# Patient Record
Sex: Male | Born: 1935 | Race: Black or African American | Hispanic: No | Smoking: Former smoker
Health system: Southern US, Community
[De-identification: ages and names within clinical notes are randomized; demographics above are authoritative.]

## PROBLEM LIST (undated history)

## (undated) DIAGNOSIS — F0280 Dementia in other diseases classified elsewhere without behavioral disturbance: Secondary | ICD-10-CM

## (undated) DIAGNOSIS — E785 Hyperlipidemia, unspecified: Secondary | ICD-10-CM

## (undated) DIAGNOSIS — E119 Type 2 diabetes mellitus without complications: Secondary | ICD-10-CM

## (undated) DIAGNOSIS — N184 Chronic kidney disease, stage 4 (severe): Secondary | ICD-10-CM

## (undated) DIAGNOSIS — I10 Essential (primary) hypertension: Secondary | ICD-10-CM

## (undated) DIAGNOSIS — E1122 Type 2 diabetes mellitus with diabetic chronic kidney disease: Secondary | ICD-10-CM

## (undated) DIAGNOSIS — E1165 Type 2 diabetes mellitus with hyperglycemia: Secondary | ICD-10-CM

## (undated) DIAGNOSIS — G309 Alzheimer's disease, unspecified: Secondary | ICD-10-CM

## (undated) DIAGNOSIS — E1129 Type 2 diabetes mellitus with other diabetic kidney complication: Secondary | ICD-10-CM

## (undated) DIAGNOSIS — IMO0001 Reserved for inherently not codable concepts without codable children: Secondary | ICD-10-CM

## (undated) DIAGNOSIS — I509 Heart failure, unspecified: Secondary | ICD-10-CM

## (undated) DIAGNOSIS — R001 Bradycardia, unspecified: Secondary | ICD-10-CM

## (undated) DIAGNOSIS — E1169 Type 2 diabetes mellitus with other specified complication: Secondary | ICD-10-CM

## (undated) DIAGNOSIS — Z794 Long term (current) use of insulin: Secondary | ICD-10-CM

## (undated) HISTORY — DX: Type 2 diabetes mellitus with hyperglycemia: E11.65

## (undated) HISTORY — DX: Type 2 diabetes mellitus with other diabetic kidney complication: E11.29

## (undated) HISTORY — DX: Hyperlipidemia, unspecified: E78.5

## (undated) HISTORY — DX: Reserved for inherently not codable concepts without codable children: IMO0001

## (undated) HISTORY — DX: Essential (primary) hypertension: I10

## (undated) HISTORY — DX: Type 2 diabetes mellitus with diabetic chronic kidney disease: E11.22

## (undated) HISTORY — DX: Heart failure, unspecified: I50.9

## (undated) HISTORY — DX: Long term (current) use of insulin: Z79.4

## (undated) HISTORY — DX: Type 2 diabetes mellitus without complications: E11.9

## (undated) HISTORY — DX: Dementia in other diseases classified elsewhere without behavioral disturbance: F02.80

## (undated) HISTORY — DX: Type 2 diabetes mellitus with other specified complication: E11.69

## (undated) HISTORY — DX: Alzheimer's disease, unspecified: G30.9

## (undated) HISTORY — DX: Chronic kidney disease, stage 4 (severe): N18.4

## (undated) HISTORY — PX: CARPAL TUNNEL RELEASE: SHX101

---

## 1989-02-18 HISTORY — PX: CERVICAL SPINE SURGERY: SHX589

## 1997-10-08 ENCOUNTER — Other Ambulatory Visit: Admission: RE | Admit: 1997-10-08 | Discharge: 1997-10-08 | Payer: Self-pay | Admitting: Internal Medicine

## 1999-08-05 ENCOUNTER — Encounter: Payer: Self-pay | Admitting: Internal Medicine

## 1999-08-05 ENCOUNTER — Ambulatory Visit (HOSPITAL_COMMUNITY): Admission: RE | Admit: 1999-08-05 | Discharge: 1999-08-05 | Payer: Self-pay | Admitting: Internal Medicine

## 1999-09-24 ENCOUNTER — Encounter: Admission: RE | Admit: 1999-09-24 | Discharge: 1999-09-24 | Payer: Self-pay | Admitting: Cardiology

## 1999-09-24 ENCOUNTER — Encounter: Payer: Self-pay | Admitting: Cardiology

## 1999-09-28 ENCOUNTER — Ambulatory Visit (HOSPITAL_COMMUNITY): Admission: RE | Admit: 1999-09-28 | Discharge: 1999-09-28 | Payer: Self-pay | Admitting: Cardiology

## 2000-08-09 ENCOUNTER — Ambulatory Visit (HOSPITAL_COMMUNITY): Admission: RE | Admit: 2000-08-09 | Discharge: 2000-08-09 | Payer: Self-pay | Admitting: Internal Medicine

## 2000-08-09 ENCOUNTER — Encounter: Payer: Self-pay | Admitting: Internal Medicine

## 2000-11-10 ENCOUNTER — Encounter: Payer: Self-pay | Admitting: Internal Medicine

## 2000-11-10 ENCOUNTER — Ambulatory Visit (HOSPITAL_COMMUNITY): Admission: RE | Admit: 2000-11-10 | Discharge: 2000-11-10 | Payer: Self-pay | Admitting: Internal Medicine

## 2001-06-20 HISTORY — PX: LUMBAR SPINE SURGERY: SHX701

## 2001-07-24 ENCOUNTER — Encounter: Payer: Self-pay | Admitting: Neurosurgery

## 2001-07-24 ENCOUNTER — Ambulatory Visit (HOSPITAL_COMMUNITY): Admission: RE | Admit: 2001-07-24 | Discharge: 2001-07-24 | Payer: Self-pay | Admitting: Neurosurgery

## 2001-08-01 ENCOUNTER — Ambulatory Visit (HOSPITAL_COMMUNITY): Admission: RE | Admit: 2001-08-01 | Discharge: 2001-08-01 | Payer: Self-pay | Admitting: Neurosurgery

## 2001-08-01 ENCOUNTER — Encounter: Payer: Self-pay | Admitting: Neurosurgery

## 2001-10-08 ENCOUNTER — Encounter: Payer: Self-pay | Admitting: Neurosurgery

## 2001-10-08 ENCOUNTER — Inpatient Hospital Stay (HOSPITAL_COMMUNITY): Admission: RE | Admit: 2001-10-08 | Discharge: 2001-10-12 | Payer: Self-pay | Admitting: Neurosurgery

## 2002-10-07 ENCOUNTER — Encounter: Payer: Self-pay | Admitting: Internal Medicine

## 2002-10-07 ENCOUNTER — Ambulatory Visit (HOSPITAL_COMMUNITY): Admission: RE | Admit: 2002-10-07 | Discharge: 2002-10-07 | Payer: Self-pay | Admitting: Internal Medicine

## 2003-10-02 ENCOUNTER — Encounter: Admission: RE | Admit: 2003-10-02 | Discharge: 2003-12-31 | Payer: Self-pay | Admitting: Endocrinology

## 2004-04-20 HISTORY — PX: CARDIAC CATHETERIZATION: SHX172

## 2004-05-24 ENCOUNTER — Ambulatory Visit: Payer: Self-pay | Admitting: Endocrinology

## 2004-06-04 ENCOUNTER — Ambulatory Visit: Payer: Self-pay | Admitting: Endocrinology

## 2004-07-09 ENCOUNTER — Ambulatory Visit: Payer: Self-pay | Admitting: Endocrinology

## 2004-09-28 ENCOUNTER — Ambulatory Visit: Payer: Self-pay | Admitting: Endocrinology

## 2004-10-08 ENCOUNTER — Ambulatory Visit: Payer: Self-pay | Admitting: Endocrinology

## 2004-12-28 ENCOUNTER — Ambulatory Visit (HOSPITAL_COMMUNITY): Admission: RE | Admit: 2004-12-28 | Discharge: 2004-12-28 | Payer: Self-pay | Admitting: Ophthalmology

## 2005-01-10 ENCOUNTER — Ambulatory Visit: Payer: Self-pay | Admitting: Endocrinology

## 2005-01-28 ENCOUNTER — Ambulatory Visit: Payer: Self-pay | Admitting: Internal Medicine

## 2005-02-03 ENCOUNTER — Ambulatory Visit (HOSPITAL_COMMUNITY): Admission: RE | Admit: 2005-02-03 | Discharge: 2005-02-03 | Payer: Self-pay | Admitting: Gastroenterology

## 2005-02-08 ENCOUNTER — Encounter: Admission: RE | Admit: 2005-02-08 | Discharge: 2005-05-09 | Payer: Self-pay | Admitting: Endocrinology

## 2005-03-02 ENCOUNTER — Ambulatory Visit: Payer: Self-pay | Admitting: Endocrinology

## 2005-04-12 ENCOUNTER — Ambulatory Visit: Payer: Self-pay | Admitting: Endocrinology

## 2005-04-20 ENCOUNTER — Ambulatory Visit: Payer: Self-pay | Admitting: Endocrinology

## 2005-05-24 ENCOUNTER — Ambulatory Visit: Payer: Self-pay | Admitting: Endocrinology

## 2005-06-24 ENCOUNTER — Ambulatory Visit: Payer: Self-pay | Admitting: Endocrinology

## 2005-09-05 ENCOUNTER — Ambulatory Visit (HOSPITAL_COMMUNITY): Admission: RE | Admit: 2005-09-05 | Discharge: 2005-09-05 | Payer: Self-pay | Admitting: Internal Medicine

## 2005-09-08 ENCOUNTER — Ambulatory Visit: Payer: Self-pay | Admitting: Endocrinology

## 2005-09-22 ENCOUNTER — Ambulatory Visit: Payer: Self-pay | Admitting: Endocrinology

## 2005-10-25 ENCOUNTER — Ambulatory Visit: Payer: Self-pay | Admitting: Endocrinology

## 2005-12-06 ENCOUNTER — Ambulatory Visit: Payer: Self-pay | Admitting: Endocrinology

## 2006-03-08 ENCOUNTER — Ambulatory Visit: Payer: Self-pay | Admitting: Endocrinology

## 2006-04-07 ENCOUNTER — Ambulatory Visit: Payer: Self-pay | Admitting: Endocrinology

## 2006-05-20 HISTORY — PX: TRANSTHORACIC ECHOCARDIOGRAM: SHX275

## 2006-06-04 ENCOUNTER — Inpatient Hospital Stay (HOSPITAL_COMMUNITY): Admission: EM | Admit: 2006-06-04 | Discharge: 2006-06-19 | Payer: Self-pay | Admitting: Emergency Medicine

## 2006-06-05 ENCOUNTER — Encounter (INDEPENDENT_AMBULATORY_CARE_PROVIDER_SITE_OTHER): Payer: Self-pay | Admitting: Cardiology

## 2006-07-07 ENCOUNTER — Ambulatory Visit: Payer: Self-pay | Admitting: Endocrinology

## 2006-10-03 ENCOUNTER — Ambulatory Visit: Payer: Self-pay | Admitting: Endocrinology

## 2006-11-20 ENCOUNTER — Ambulatory Visit: Payer: Self-pay | Admitting: Endocrinology

## 2006-12-06 ENCOUNTER — Ambulatory Visit: Payer: Self-pay | Admitting: Endocrinology

## 2007-01-03 ENCOUNTER — Ambulatory Visit: Payer: Self-pay | Admitting: Endocrinology

## 2007-01-03 LAB — CONVERTED CEMR LAB: Hgb A1c MFr Bld: 8.4 % — ABNORMAL HIGH (ref 4.6–6.0)

## 2007-01-10 ENCOUNTER — Encounter: Payer: Self-pay | Admitting: Endocrinology

## 2007-02-02 ENCOUNTER — Ambulatory Visit: Payer: Self-pay | Admitting: Endocrinology

## 2007-05-03 ENCOUNTER — Ambulatory Visit: Payer: Self-pay | Admitting: Endocrinology

## 2007-08-30 ENCOUNTER — Ambulatory Visit (HOSPITAL_COMMUNITY): Admission: RE | Admit: 2007-08-30 | Discharge: 2007-08-30 | Payer: Self-pay | Admitting: Otolaryngology

## 2007-08-31 ENCOUNTER — Ambulatory Visit: Payer: Self-pay | Admitting: Endocrinology

## 2007-10-05 ENCOUNTER — Telehealth: Payer: Self-pay | Admitting: Endocrinology

## 2007-10-12 ENCOUNTER — Ambulatory Visit: Payer: Self-pay | Admitting: Endocrinology

## 2007-11-07 ENCOUNTER — Encounter: Payer: Self-pay | Admitting: Endocrinology

## 2007-11-27 ENCOUNTER — Ambulatory Visit: Payer: Self-pay | Admitting: Endocrinology

## 2007-12-03 ENCOUNTER — Telehealth: Payer: Self-pay | Admitting: Endocrinology

## 2008-01-31 ENCOUNTER — Telehealth: Payer: Self-pay | Admitting: Endocrinology

## 2008-02-28 ENCOUNTER — Ambulatory Visit: Payer: Self-pay | Admitting: Endocrinology

## 2008-05-29 ENCOUNTER — Ambulatory Visit: Payer: Self-pay | Admitting: Endocrinology

## 2008-06-04 ENCOUNTER — Telehealth (INDEPENDENT_AMBULATORY_CARE_PROVIDER_SITE_OTHER): Payer: Self-pay | Admitting: *Deleted

## 2008-06-20 LAB — HM COLONOSCOPY

## 2008-08-28 ENCOUNTER — Ambulatory Visit: Payer: Self-pay | Admitting: Endocrinology

## 2008-11-27 ENCOUNTER — Ambulatory Visit: Payer: Self-pay | Admitting: Endocrinology

## 2008-12-03 ENCOUNTER — Encounter: Payer: Self-pay | Admitting: Endocrinology

## 2008-12-13 ENCOUNTER — Encounter: Payer: Self-pay | Admitting: Endocrinology

## 2009-02-26 ENCOUNTER — Ambulatory Visit: Payer: Self-pay | Admitting: Endocrinology

## 2009-02-26 LAB — CONVERTED CEMR LAB
Creatinine,U: 156.9 mg/dL
Microalb Creat Ratio: 1.3 mg/g (ref 0.0–30.0)
Microalb, Ur: 0.2 mg/dL (ref 0.0–1.9)

## 2009-05-28 ENCOUNTER — Ambulatory Visit: Payer: Self-pay | Admitting: Endocrinology

## 2009-05-28 LAB — CONVERTED CEMR LAB: Hgb A1c MFr Bld: 8.8 % — ABNORMAL HIGH (ref 4.6–6.5)

## 2009-06-04 ENCOUNTER — Encounter: Payer: Self-pay | Admitting: Endocrinology

## 2009-08-20 ENCOUNTER — Encounter: Admission: RE | Admit: 2009-08-20 | Discharge: 2009-08-20 | Payer: Self-pay | Admitting: Gastroenterology

## 2009-08-27 ENCOUNTER — Ambulatory Visit: Payer: Self-pay | Admitting: Endocrinology

## 2009-08-31 ENCOUNTER — Telehealth: Payer: Self-pay | Admitting: Endocrinology

## 2009-09-03 ENCOUNTER — Ambulatory Visit: Payer: Self-pay | Admitting: Endocrinology

## 2009-09-22 ENCOUNTER — Telehealth: Payer: Self-pay | Admitting: Endocrinology

## 2009-09-25 ENCOUNTER — Ambulatory Visit: Payer: Self-pay | Admitting: Endocrinology

## 2009-10-16 ENCOUNTER — Telehealth: Payer: Self-pay | Admitting: Endocrinology

## 2009-10-19 ENCOUNTER — Telehealth: Payer: Self-pay | Admitting: Endocrinology

## 2009-12-03 ENCOUNTER — Encounter: Payer: Self-pay | Admitting: Endocrinology

## 2009-12-08 ENCOUNTER — Ambulatory Visit: Payer: Self-pay | Admitting: Endocrinology

## 2009-12-08 LAB — CONVERTED CEMR LAB: Hgb A1c MFr Bld: 8.6 % — ABNORMAL HIGH (ref 4.6–6.5)

## 2009-12-09 ENCOUNTER — Telehealth: Payer: Self-pay | Admitting: Endocrinology

## 2009-12-10 ENCOUNTER — Ambulatory Visit (HOSPITAL_COMMUNITY): Admission: RE | Admit: 2009-12-10 | Discharge: 2009-12-10 | Payer: Self-pay | Admitting: Internal Medicine

## 2010-03-04 ENCOUNTER — Encounter: Payer: Self-pay | Admitting: Endocrinology

## 2010-03-22 ENCOUNTER — Telehealth: Payer: Self-pay | Admitting: Endocrinology

## 2010-04-05 ENCOUNTER — Ambulatory Visit: Payer: Self-pay | Admitting: Endocrinology

## 2010-06-09 ENCOUNTER — Encounter: Payer: Self-pay | Admitting: Endocrinology

## 2010-06-11 ENCOUNTER — Telehealth: Payer: Self-pay | Admitting: Endocrinology

## 2010-07-08 ENCOUNTER — Ambulatory Visit
Admission: RE | Admit: 2010-07-08 | Discharge: 2010-07-08 | Payer: Self-pay | Source: Home / Self Care | Attending: Endocrinology | Admitting: Endocrinology

## 2010-07-08 ENCOUNTER — Other Ambulatory Visit: Payer: Self-pay | Admitting: Endocrinology

## 2010-07-08 LAB — HEMOGLOBIN A1C: Hgb A1c MFr Bld: 8.2 % — ABNORMAL HIGH (ref 4.6–6.5)

## 2010-07-22 NOTE — Miscellaneous (Signed)
Summary: [facility]   Imported By: Bubba Hales 09/10/2009 09:53:55  _____________________________________________________________________  External Attachment:    Type:   Image     Comment:   External Document

## 2010-07-22 NOTE — Assessment & Plan Note (Signed)
Summary: 3 MTH FU STC   Vital Signs:  Patient profile:   77 year old male Height:      72 inches (182.88 cm) Weight:      207 pounds (94.09 kg) O2 Sat:      96 % on Room air Temp:     96.8 degrees F (36 degrees C) oral Pulse rate:   65 / minute BP sitting:   138 / 64  (left arm) Cuff size:   large  Vitals Entered By: Gardenia Phlegm RMA (August 27, 2009 1:21 PM)  O2 Flow:  Room air CC: 3 month follow up/ pt states he is no longer taking align and Tesim is on there twice/ CF Is Patient Diabetic? Yes   Primary Provider:  [facility]  CC:  3 month follow up/ pt states he is no longer taking align and Tesim is on there twice/ CF.  History of Present Illness: pt says he takes a varying dosage of insulin, according to his cbg.  pt states he feels well in general.  no cbg record, but states cbg's vary from 52-200.  it is low if he misses a meal.  Current Medications (verified): 1)  Aspirin 81 Mg  Tbec (Aspirin) .... Take 1 By Mouth Qd 2)  Xalatan 0.005 %  Soln (Latanoprost) .... Use 1 Drop Two Times A Day Right Eye 3)  Alphagan P 0.1 %  Soln (Brimonidine Tartrate) .... Use in Both Eyes 1 Drop Three Times A Day 4)  Cialis 20 Mg  Tabs (Tadalafil) .... Use Prn 5)  Norvasc 10 Mg  Tabs (Amlodipine Besylate) .... Take 1 By Mouth Qd 6)  Ditropan Xl 5 Mg  Tb24 (Oxybutynin Chloride) .... Take 1 By Mouth Qd 7)  Hydralazine Hcl 25 Mg  Tabs (Hydralazine Hcl) .... Take 1/2 Tab By Mouth Two Times A Day Qd 8)  Carvedilol 12.5 Mg  Tabs (Carvedilol) .... Take 1 By Mouth Two Times A Day Qd 9)  Furosemide 40 Mg  Tabs (Furosemide) .... Take 1 By Mouth Two Times A Day Qd 10)  Simvastatin 20 Mg  Tabs (Simvastatin) .... Take 1 By Mouth Qd 11)  Lisinopril 20 Mg  Tabs (Lisinopril) .... Take 1 1/2 Tab By Mouth Qd 12)  Uroxatral 10 Mg  Tb24 (Alfuzosin Hcl) .... Take 1 By Mouth Qd 13)  Align   Caps (Misc Intestinal Flora Regulat) .... Take 1 By Mouth Qd 14)  Testim 1 %  Gel (Testosterone) .... Use As Directed 15)   Oxybutynin Chloride 5 Mg  Tb24 (Oxybutynin Chloride) .... Take 1 By Mouth Qd 16)  Trusopt 2 %  Soln (Dorzolamide Hcl) .... Use 1 Drop in (L) Ear Three Times A Day 17)  Novolog Mix 70/30 Flexpen 70-30 %  Susp (Insulin Aspart Prot & Aspart) .... Take 40 Units Q Am, 24 As Needed 18)  Vitamin D 1000 Unit  Tabs (Cholecalciferol) .... Take 1 Three Times A Day Qd 19)  Freestyle Test   Strp (Glucose Blood) .... Check Blood Sugar Qid 20)  Bd U/f Mini Pen Needle 31g X 5 Mm Misc (Insulin Pen Needle) .... Check Blood Sugars 1-2 Times A Day 21)  Gentle Stool Softener 100 Mg Caps (Docusate Sodium) .... Take 1 By Mouth Qd 22)  Meloxicam 15 Mg Tabs (Meloxicam) .... Take 1 By Mouth Once Daily For Arthritis 23)  Glucosamine Sulfate 6000 Mg Tabs (Glucosamine Sulfate) .... Take 1 Two Times A Day 24)  Testim 1 % Gel (Testosterone) .... As  Needed 25)  Naftin 40 Gm .... Apply To Affected Area 26)  Magnesium .Marland Kitchen.. 1-2 Daily  Allergies (verified): No Known Drug Allergies  Past History:  Past Medical History: Dyslipidemia Glaucoma CVA Left eye DM Retinopathy  HYPERTENSION (ICD-401.9) GERD (ICD-530.81) DIABETES MELLITUS, TYPE II (ICD-250.00) CONGESTIVE HEART FAILURE (ICD-428.0)  Review of Systems  The patient denies syncope.    Physical Exam  General:  normal appearance.   Neck:  no goiter Additional Exam:  Hemoglobin A1C       [H]  8.6 %   Impression & Recommendations:  Problem # 1:  DIABETES MELLITUS, TYPE II (ICD-250.00) he may do better with a simpler regimen  Medications Added to Medication List This Visit: 1)  Levemir Flexpen 100 Unit/ml Soln (Insulin detemir) .... 50 units each am, and pen needles once daily, #30  Other Orders: TLB-A1C / Hgb A1C (Glycohemoglobin) (83036-A1C) Est. Patient Level III [id number]) Prescription Created Electronically [id number])  Patient Instructions: 1)  change current insulin to levemir 50 units each am, and none later in the day.  take exactly this schedule, no  matter what your blood sugar is.   2)  return next week. 3)  check your blood sugar 2 times a day.  vary the time of day when you check, between before the 3 meals, and at bedtime.  also check if you have symptoms of your blood sugar being too high or too low.  please keep a record of the readings and bring it to your next appointment here.  please call us sooner if you are having low blood sugar episodes. Prescriptions: LEVEMIR FLEXPEN 100 UNIT/ML SOLN (INSULIN DETEMIR) 50 units each am, and pen needles once daily, #30  #1 box x 11   Entered and Authorized by:   Donavan Foil MD   Signed by:   Donavan Foil MD on 08/27/2009   Method used:   Electronically to        [facility] (retail)       [place], [place]  [id number]       Ph: [phone]       Fax: [phone]   RxID:   [id number]

## 2010-07-22 NOTE — Progress Notes (Signed)
  Phone Note From Pharmacy   Caller: [phone] Request: Speak with Provider Details for Reason: Verify Levemir dosage Summary of Call: Recieved electronic rx for Levemir Flexpen take 3 units as need for CBG over 200. Pt has been on 60 units q am. Is this a dose change? Pls advise Initial call taken by: Tomma Lightning,  December 09, 2009 11:11 AM  Follow-up for Phone Call        should be novolog.  i sent rx.  levemir is unchanged at 60 units each am. Follow-up by: Donavan Foil MD,  December 09, 2009 11:54 AM    New/Updated Medications: NOVOLOG FLEXPEN 100 UNIT/ML SOLN (INSULIN ASPART) 3 units as needed cbg over 200 Prescriptions: NOVOLOG FLEXPEN 100 UNIT/ML SOLN (INSULIN ASPART) 3 units as needed cbg over 200  #1 box x 11   Entered and Authorized by:   Donavan Foil MD   Signed by:   Donavan Foil MD on 12/09/2009   Method used:   Electronically to        [facility] (retail)       [place], [place]  [id number]       Ph: [phone]       Fax: [phone]   RxID:   [id number]

## 2010-07-22 NOTE — Progress Notes (Signed)
Summary: levemir flexpen  Phone Note From Pharmacy   Caller: [facility]* Summary of Call: Recieved fax stating pt states he is taking 70 units now on his Levemir. He is close to running out and the insurance will not fill with old directions. Please send new rx. Initial call taken by: Tomma Lightning RMA,  June 11, 2010 1:42 PM  Follow-up for Phone Call        Per emr pt is taking 70 units every morning will send new script Follow-up by: Tomma Lightning RMA,  June 11, 2010 1:45 PM    New/Updated Medications: LEVEMIR FLEXPEN 100 UNIT/ML SOLN (INSULIN DETEMIR) take 70 units each am Prescriptions: LEVEMIR FLEXPEN 100 UNIT/ML SOLN (INSULIN DETEMIR) take 70 units each am  #75ml x 3   Entered by:   Tomma Lightning RMA   Authorized by:   Donavan Foil MD   Signed by:   Tomma Lightning RMA on 06/11/2010   Method used:   Electronically to        [facility] (retail)       [address] [place], [place]  [id number]       Ph: [phone]       Fax: [phone]   RxID:   [id number]

## 2010-07-22 NOTE — Assessment & Plan Note (Signed)
Summary: 1 WK ROV /NWS   Vital Signs:  Patient profile:   77 year old male Height:      72 inches (182.88 cm) Weight:      208.38 pounds (94.72 kg) O2 Sat:      97 % on Room air Temp:     98.5 degrees F (36.94 degrees C) oral Pulse rate:   64 / minute BP sitting:   136 / 70  (left arm) Cuff size:   large  Vitals Entered By: Gardenia Phlegm RMA (September 03, 2009 2:36 PM)  O2 Flow:  Room air CC: 1 week follow up/ CF Is Patient Diabetic? Yes   Primary Provider:  [facility]  CC:  1 week follow up/ CF.  History of Present Illness: pt says he has increased the levemir to 60 units each am.  pt states he feels well in general.  he brings a record of his cbg's which i have reviewed today.  it varies from 103-180, with no trend throughout the day.    Current Medications (verified): 1)  Aspirin 81 Mg  Tbec (Aspirin) .... Take 1 By Mouth Qd 2)  Xalatan 0.005 %  Soln (Latanoprost) .... Use 1 Drop Two Times A Day Right Eye 3)  Alphagan P 0.1 %  Soln (Brimonidine Tartrate) .... Use in Both Eyes 1 Drop Three Times A Day 4)  Cialis 20 Mg  Tabs (Tadalafil) .... Use Prn 5)  Norvasc 10 Mg  Tabs (Amlodipine Besylate) .... Take 1 By Mouth Qd 6)  Ditropan Xl 5 Mg  Tb24 (Oxybutynin Chloride) .... Take 1 By Mouth Qd 7)  Hydralazine Hcl 25 Mg  Tabs (Hydralazine Hcl) .... Take 1/2 Tab By Mouth Two Times A Day Qd 8)  Carvedilol 12.5 Mg  Tabs (Carvedilol) .... Take 1 By Mouth Two Times A Day Qd 9)  Furosemide 40 Mg  Tabs (Furosemide) .... Take 1 By Mouth Two Times A Day Qd 10)  Simvastatin 20 Mg  Tabs (Simvastatin) .... Take 1 By Mouth Qd 11)  Lisinopril 20 Mg  Tabs (Lisinopril) .... Take 1 1/2 Tab By Mouth Qd 12)  Uroxatral 10 Mg  Tb24 (Alfuzosin Hcl) .... Take 1 By Mouth Qd 13)  Align   Caps (Misc Intestinal Flora Regulat) .... Take 1 By Mouth Qd 14)  Testim 1 %  Gel (Testosterone) .... Use As Directed 15)  Oxybutynin Chloride 5 Mg  Tb24 (Oxybutynin Chloride) .... Take 1 By Mouth Qd 16)  Trusopt 2 %   Soln (Dorzolamide Hcl) .... Use 1 Drop in (L) Ear Three Times A Day 17)  Vitamin D 1000 Unit  Tabs (Cholecalciferol) .... Take 1 Three Times A Day Qd 18)  Freestyle Test   Strp (Glucose Blood) .... Check Blood Sugar Qid 19)  Bd U/f Mini Pen Needle 31g X 5 Mm Misc (Insulin Pen Needle) .... Check Blood Sugars 1-2 Times A Day 20)  Gentle Stool Softener 100 Mg Caps (Docusate Sodium) .... Take 1 By Mouth Qd 21)  Meloxicam 15 Mg Tabs (Meloxicam) .... Take 1 By Mouth Once Daily For Arthritis 22)  Glucosamine Sulfate 6000 Mg Tabs (Glucosamine Sulfate) .... Take 1 Two Times A Day 23)  Naftin 40 Gm .... Apply To Affected Area 24)  Magnesium .Marland Kitchen.. 1-2 Daily 25)  Levemir Flexpen 100 Unit/ml Soln (Insulin Detemir) .... 60 Units Each Am, and Pen Needles Once Daily, #30  Allergies (verified): No Known Drug Allergies  Past History:  Past Medical History: Last updated: 08/27/2009 Dyslipidemia Glaucoma  CVA Left eye DM Retinopathy  HYPERTENSION (ICD-401.9) GERD (ICD-530.81) DIABETES MELLITUS, TYPE II (ICD-250.00) CONGESTIVE HEART FAILURE (ICD-428.0)  Review of Systems  The patient denies hypoglycemia.    Physical Exam  General:  normal appearance.   Msk:  gait is normal and steady   Impression & Recommendations:  Problem # 1:  DIABETES MELLITUS, TYPE II (ICD-250.00) Assessment Improved  Medications Added to Medication List This Visit: 1)  Freestyle Test Strp (Glucose blood) .... Check blood sugar 4x a day, and lancets 250.01  Other Orders: Prescription Created Electronically [id number]) Est. Patient Level III [id number])  Patient Instructions: 1)  continue levemir 60 units each am, and none later in the day.  take exactly this amount, no matter what your blood sugar is.   2)  return 3 months.  3)  check your blood sugar 4 times a day--before the 3 meals, and at bedtime.  i have sent a prescription for this to your pharmacy.  also check if you have symptoms of your blood sugar being too high  or too low.  please keep a record of the readings and bring it to your next appointment here.  please call us sooner if you are having low blood sugar episodes.   Prescriptions: FREESTYLE TEST   STRP (GLUCOSE BLOOD) check blood sugar 4x a day, and lancets 250.01  #360 x 11   Entered and Authorized by:   Donavan Foil MD   Signed by:   Donavan Foil MD on 09/03/2009   Method used:   Electronically to        [facility] (retail)       [place], [place]  [id number]       Ph: [phone]       Fax: [phone]   RxID:   [id number]

## 2010-07-22 NOTE — Assessment & Plan Note (Signed)
Summary: f/u appt/cd   Vital Signs:  Patient profile:   77 year old male Height:      72 inches (182.88 cm) Weight:      200.50 pounds (91.14 kg) BMI:     27.29 O2 Sat:      97 % on Room air Temp:     98.1 degrees F (36.72 degrees C) oral Pulse rate:   61 / minute BP sitting:   124 / 72  (left arm) Cuff size:   regular  Vitals Entered By: Rebeca Alert MA (April 05, 2010 1:36 PM)  O2 Flow:  Room air CC: F/U appt/pt  no longer taking Norvasc, Aligh , Magnesium, Miralax or stool softeners/aj Is Patient Diabetic? Yes   Primary Nechuma Boven:  [facility]  CC:  F/U appt/pt  no longer taking Norvasc, Aligh , Magnesium, and Miralax or stool softeners/aj.  History of Present Illness: pt states he feels well in general.  he had 1 episode of mild hypoglycemia, when a meak was missed.  no cbg record, but states cbg's are otherwise well-controlled.  Current Medications (verified): 1)  Aspirin 81 Mg  Tbec (Aspirin) .... Take 1 By Mouth Qd 2)  Xalatan 0.005 %  Soln (Latanoprost) .... Use 1 Drop Two Times A Day Right Eye 3)  Alphagan P 0.1 %  Soln (Brimonidine Tartrate) .... Use in Both Eyes 1 Drop Three Times A Day 4)  Cialis 20 Mg  Tabs (Tadalafil) .... Use Prn 5)  Norvasc 10 Mg  Tabs (Amlodipine Besylate) .... Take 1 By Mouth Qd 6)  Ditropan Xl 5 Mg  Tb24 (Oxybutynin Chloride) .... Take 1 By Mouth Qd 7)  Hydralazine Hcl 25 Mg  Tabs (Hydralazine Hcl) .... Take 1/2 Tab By Mouth Two Times A Day Qd 8)  Carvedilol 12.5 Mg  Tabs (Carvedilol) .... Take 1 By Mouth Two Times A Day Qd 9)  Furosemide 40 Mg  Tabs (Furosemide) .... Take 1 By Mouth Two Times A Day Qd 10)  Simvastatin 20 Mg  Tabs (Simvastatin) .... Take 1 By Mouth Qd 11)  Lisinopril 20 Mg  Tabs (Lisinopril) .... Take 1 1/2 Tab By Mouth Qd 12)  Uroxatral 10 Mg  Tb24 (Alfuzosin Hcl) .... Take 1 By Mouth Qd 13)  Align   Caps (Misc Intestinal Flora Regulat) .... Take 1 By Mouth Qd 14)  Testim 1 %  Gel (Testosterone) .... Use As Directed 15)   Oxybutynin Chloride 5 Mg  Tb24 (Oxybutynin Chloride) .... Take 1 By Mouth Qd 16)  Trusopt 2 %  Soln (Dorzolamide Hcl) .... Use 1 Drop in (L) Ear Three Times A Day 17)  Vitamin D 1000 Unit  Tabs (Cholecalciferol) .... Take 1 Three Times A Day Qd 18)  Freestyle Test   Strp (Glucose Blood) .... Check Blood Sugar 4x A Day, and Lancets 250.01 19)  Bd U/f Mini Pen Needle 31g X 5 Mm Misc (Insulin Pen Needle) .... Check Blood Sugars 1-2 Times A Day 20)  Gentle Stool Softener 100 Mg Caps (Docusate Sodium) .... Take 1 By Mouth Qd 21)  Meloxicam 15 Mg Tabs (Meloxicam) .... Take 1 By Mouth Once Daily For Arthritis 22)  Glucosamine Sulfate 6000 Mg Tabs (Glucosamine Sulfate) .... Take 1 Two Times A Day 23)  Naftin 40 Gm .... Apply To Affected Area 24)  Magnesium .Marland Kitchen.. 1-2 Daily 25)  Levemir Flexpen 100 Unit/ml Soln (Insulin Detemir) .... 70 Units Each Am, and Pen Needles Once Daily, #30 26)  Miralax Stool Softner .... Once  Daily As Needed 27)  Novolog Flexpen 100 Unit/ml Soln (Insulin Aspart) .... 3 Units As Needed Cbg Over 200 28)  Diltiazem Hcl Cr 240 Mg Xr24h-Cap (Diltiazem Hcl) .Marland Kitchen.. 1 By Mouth At Bedtime 29)  Pred Forte 1 % Susp (Prednisolone Acetate) .Marland Kitchen.. 1 Drop in Left Eye Once Daily  Allergies (verified): No Known Drug Allergies  Review of Systems  The patient denies syncope.    Physical Exam  General:  normal appearance.   Pulses:  dorsalis pedis intact bilat.   Extremities:  no deformity.  no ulcer on the feet.  feet are of normal color and temp.  1+ right pedal edema and 1+ left pedal edema.  there is bilateral onychomycosis. Neurologic:  sensation is intact to touch on the feet.  Additional Exam:  outside test results are reviewed:  a1c=7.5   Impression & Recommendations:  Problem # 1:  DIABETES MELLITUS, TYPE II (ICD-250.00) this is the best control this pt should aim for, given this regimen, which does match insulin to his changing needs throughout the day  Medications Added to  Medication List This Visit: 1)  Diltiazem Hcl Cr 240 Mg Xr24h-cap (Diltiazem hcl) .Marland Kitchen.. 1 by mouth at bedtime 2)  Pred Forte 1 % Susp (Prednisolone acetate) .Marland Kitchen.. 1 drop in left eye once daily  Other Orders: Est. Patient Level III [id number])  Patient Instructions: 1)  please continue the same insulin for now 2)  Please schedule a follow-up appointment in 3 months. 3)  on this type of insulin schedule, meals should not be missed or delayed.   4)  check your blood sugar 3 times a day.  vary the time of day when you check, between before the 3 meals, and at bedtime.  also check if you have symptoms of your blood sugar being too high or too low.  please keep a record of the readings and bring it to your next appointment here.  please call us sooner if you are having low blood sugar episodes. 5)  also take novolog 3 units as needed for any blood sugar over 200.   Orders Added: 1)  Est. Patient Level III [id number]  Appended Document: f/u appt/cd     Allergies: No Known Drug Allergies   Complete Medication List: 1)  Aspirin 81 Mg Tbec (Aspirin) .... Take 1 by mouth qd 2)  Xalatan 0.005 % Soln (Latanoprost) .... Use 1 drop two times a day right eye 3)  Alphagan P 0.1 % Soln (Brimonidine tartrate) .... Use in both eyes 1 drop three times a day 4)  Cialis 20 Mg Tabs (Tadalafil) .... Use prn 5)  Norvasc 10 Mg Tabs (Amlodipine besylate) .... Take 1 by mouth qd 6)  Ditropan Xl 5 Mg Tb24 (Oxybutynin chloride) .... Take 1 by mouth qd 7)  Hydralazine Hcl 25 Mg Tabs (Hydralazine hcl) .... Take 1/2 tab by mouth two times a day qd 8)  Carvedilol 12.5 Mg Tabs (Carvedilol) .... Take 1 by mouth two times a day qd 9)  Furosemide 40 Mg Tabs (Furosemide) .... Take 1 by mouth two times a day qd 10)  Simvastatin 20 Mg Tabs (Simvastatin) .... Take 1 by mouth qd 11)  Lisinopril 20 Mg Tabs (Lisinopril) .... Take 1 1/2 tab by mouth qd 12)  Uroxatral 10 Mg Tb24 (Alfuzosin hcl) .... Take 1 by mouth qd 13)  Align  Caps (Misc intestinal flora regulat) .... Take 1 by mouth qd 14)  Testim 1 % Gel (Testosterone) .... Use as directed  15)  Oxybutynin Chloride 5 Mg Tb24 (Oxybutynin chloride) .... Take 1 by mouth qd 16)  Trusopt 2 % Soln (Dorzolamide hcl) .... Use 1 drop in (l) ear three times a day 17)  Vitamin D 1000 Unit Tabs (Cholecalciferol) .... Take 1 three times a day qd 18)  Freestyle Test Strp (Glucose blood) .... Check blood sugar 4x a day, and lancets 250.01 19)  Bd U/f Mini Pen Needle 31g X 5 Mm Misc (Insulin pen needle) .... Check blood sugars 1-2 times a day 20)  Gentle Stool Softener 100 Mg Caps (Docusate sodium) .... Take 1 by mouth qd 21)  Meloxicam 15 Mg Tabs (Meloxicam) .... Take 1 by mouth once daily for arthritis 22)  Glucosamine Sulfate 6000 Mg Tabs (glucosamine Sulfate)  .... Take 1 two times a day 23)  Naftin 40 Gm  .... Apply to affected area 24)  Magnesium  .Marland Kitchen.. 1-2 daily 25)  Levemir Flexpen 100 Unit/ml Soln (Insulin detemir) .... 70 units each am, and pen needles once daily, #30 26)  Miralax Stool Softner  .... Once daily as needed 27)  Novolog Flexpen 100 Unit/ml Soln (Insulin aspart) .... 3 units as needed cbg over 200 28)  Diltiazem Hcl Cr 240 Mg Xr24h-cap (Diltiazem hcl) .Marland Kitchen.. 1 by mouth at bedtime 29)  Pred Forte 1 % Susp (Prednisolone acetate) .Marland Kitchen.. 1 drop in left eye once daily  Other Orders: Flu Vaccine 77yrs + MEDICARE PATIENTS [id number]) Administration Flu vaccine - MCR (G0008)   Orders Added: 1)  Flu Vaccine 13yrs + MEDICARE PATIENTS [Q2039] 2)  Administration Flu vaccine - MCR [id number]   Flu Vaccine Consent Questions     Do you have a history of severe allergic reactions to this vaccine? no    Any prior history of allergic reactions to egg and/or gelatin? no    Do you have a sensitivity to the preservative Thimersol? no    Do you have a past history of Guillan-Barre Syndrome? no    Do you currently have an acute febrile illness? no    Have you ever had a severe  reaction to latex? no    Vaccine information given and explained to patient? yes    Are you currently pregnant? no    Lot Number:[id number]   Exp Date:12/18/2010   Site Given  Left Deltoid IM         [email or web address]

## 2010-07-22 NOTE — Progress Notes (Signed)
Summary: Pharmacy request  Phone Note From Pharmacy   Caller: [facility]* Summary of Call: pharmacy called requesting updated Rx for Levermir with increased dosage Initial call taken by: Crissie Sickles, CMA,  October 16, 2009 1:45 PM    Prescriptions: LEVEMIR FLEXPEN 100 UNIT/ML SOLN (INSULIN DETEMIR) 60 units each am, and pen needles once daily, #30  #34mth x 5   Entered by:   Crissie Sickles, CMA   Authorized by:   Donavan Foil MD   Signed by:   Crissie Sickles, CMA on 10/16/2009   Method used:   Electronically to        [facility]* (retail)       [address] [place], [place]  [id number]       Ph: [phone]       Fax: [phone]   RxID:   [id number]

## 2010-07-22 NOTE — Progress Notes (Signed)
Summary: levemir insulin  Phone Note From Pharmacy   Caller: [facility]* Call For: fax for md  Summary of Call: Recived fax stating levemir insulin was increased to 60 units q am. Rx was sent in on 10/16/09 with correct info. Faxed back paper req with dosage. Initial call taken by: Tomma Lightning,  Oct 19, 2009 3:36 PM

## 2010-07-22 NOTE — Progress Notes (Signed)
Summary: Insulin?  Phone Note Call from Patient Call back at Home Phone [phone]   Caller: Patient Summary of Call: pt called stating that sine insulin was changed to [place] he has been experiencing dizziness and lightheadedness. please advise Initial call taken by: Crissie Sickles, [place],  September 22, 2009 4:24 PM  Follow-up for Phone Call        the effect of the insulin is on your blood sugar.  please let me know if it is low, or if it is over 300. Follow-up by: Donavan Foil MD,  September 22, 2009 4:32 PM  Additional Follow-up for Phone Call Additional follow up Details #1::        left message on machine for pt to return my call and provide the above information Additional Follow-up by: Crissie Sickles, [place],  September 23, 2009 9:57 AM    Additional Follow-up for Phone Call Additional follow up Details #2::    pt states that he experiences the dizziness right after taking his am dose of Insulin. He says his low are usually in the 80s and the high can go up to 330s but he does ot experience any sys at that time. Pt also says that dizziness is not very bad but he just wanted to inform MD and see if there is something he should be doing. please advise Follow-up by: Crissie Sickles, [place],  September 23, 2009 11:02 AM  Additional Follow-up for Phone Call Additional follow up Details #3:: Details for Additional Follow-up Action Taken: please move-up appointment to within 1 week.  bring cbg record.  pt informed and transferred to sch appt. Crissie Sickles, CMA  September 23, 2009 1:09 PM  Additional Follow-up by: Donavan Foil MD,  September 23, 2009 12:14 PM

## 2010-07-22 NOTE — Assessment & Plan Note (Signed)
Summary: 3 MO ROV /NWS  #   Vital Signs:  Patient profile:   77 year old male Height:      72 inches (182.88 cm) Weight:      199 pounds (90.45 kg) BMI:     27.09 O2 Sat:      96 % on Room air Temp:     98.9 degrees F (37.17 degrees C) oral Pulse rate:   56 / minute Pulse rhythm:   regular BP sitting:   120 / 62  (left arm) Cuff size:   regular  Vitals Entered By: Rebeca Alert CMA Deborra Medina) (July 08, 2010 2:41 PM)  O2 Flow:  Room air CC: 3 month F/U/pt is no longer takng Cialis, Norvasc, Testim, or Magnesium/aj Is Patient Diabetic? Yes   Primary Provider:  [facility]  CC:  3 month F/U/pt is no longer takng Cialis, Norvasc, Testim, and or Magnesium/aj.  History of Present Illness: pt takes levemir, 70 units each am.  pt states he feels well in general.  he brings a record of his cbg's which i have reviewed today.  it varies from 200 (am), to 70 (afternoon).    Current Medications (verified): 1)  Aspirin 81 Mg  Tbec (Aspirin) .... 1/2 Tablet By Mouth Once Daily 2)  Xalatan 0.005 %  Soln (Latanoprost) .... Use 1 Drop Two Times A Day Right Eye 3)  Alphagan P 0.1 %  Soln (Brimonidine Tartrate) .... Use 1 Drop in Right Eye Two Times A Day 4)  Cialis 20 Mg  Tabs (Tadalafil) .... Use Prn 5)  Norvasc 10 Mg  Tabs (Amlodipine Besylate) .... Take 1 By Mouth Qd 6)  Ditropan Xl 5 Mg  Tb24 (Oxybutynin Chloride) .... Take 1 By Mouth Qd 7)  Hydralazine Hcl 25 Mg  Tabs (Hydralazine Hcl) .... Take 1/2 Tab By Mouth Two Times A Day Qd 8)  Carvedilol 12.5 Mg  Tabs (Carvedilol) .... Take 1/2 Tablet By Mouth Two Times A Day Once Daily 9)  Furosemide 40 Mg  Tabs (Furosemide) .... Take 1 By Mouth Two Times A Day Qd 10)  Simvastatin 20 Mg  Tabs (Simvastatin) .... Take 1 By Mouth Qd 11)  Lisinopril 20 Mg  Tabs (Lisinopril) .... Take 1 1/2 Tab By Mouth Qd 12)  Uroxatral 10 Mg  Tb24 (Alfuzosin Hcl) .... Take 1 By Mouth Qd 13)  Align   Caps (Misc Intestinal Flora Regulat) .... Take 1 By Mouth Qd 14)   Testim 1 %  Gel (Testosterone) .... Use As Directed 15)  Oxybutynin Chloride 5 Mg  Tb24 (Oxybutynin Chloride) .... Take 1 By Mouth Qd 16)  Trusopt 2 %  Soln (Dorzolamide Hcl) .... Use 1 Drop in Right Eye Two Times A Day 17)  Vitamin D3 5000 Unit Caps (Cholecalciferol) .Marland Kitchen.. 1 By Mouth Two Times A Day 18)  Freestyle Test   Strp (Glucose Blood) .... Check Blood Sugar 4x A Day, and Lancets 250.01 19)  Bd U/f Mini Pen Needle 31g X 5 Mm Misc (Insulin Pen Needle) .... Check Blood Sugars 1-2 Times A Day 20)  Gentle Stool Softener 100 Mg Caps (Docusate Sodium) .... Take 1 By Mouth Qd 21)  Meloxicam 15 Mg Tabs (Meloxicam) .... Take 1 By Mouth Once Daily For Arthritis 22)  Glucosamine Sulfate 6000 Mg Tabs (Glucosamine Sulfate) .... Take 1 Two Times A Day 23)  Naftin 40 Gm .... Apply To Affected Area 24)  Magnesium .Marland Kitchen.. 1-2 Daily 25)  Levemir Flexpen 100 Unit/ml Soln (Insulin Detemir) .Marland KitchenMarland KitchenMarland Kitchen  Take 70 Units Each Am 26)  Miralax Stool Softner .... Once Daily As Needed 27)  Novolog Flexpen 100 Unit/ml Soln (Insulin Aspart) .... 3 Units As Needed Cbg Over 200 28)  Diltiazem Hcl Cr 240 Mg Xr24h-Cap (Diltiazem Hcl) .Marland Kitchen.. 1 By Mouth At Bedtime 29)  Pred Forte 1 % Susp (Prednisolone Acetate) .Marland Kitchen.. 1 Drop in Left Eye Once Daily 30)  Lumigan 0.01 % Soln (Bimatoprost) .Marland Kitchen.. 1 Drop in Right Eye At Bedtime  Allergies (verified): No Known Drug Allergies  Past History:  Past Medical History: Last updated: 08/27/2009 Dyslipidemia Glaucoma CVA Left eye DM Retinopathy  HYPERTENSION (ICD-401.9) GERD (ICD-530.81) DIABETES MELLITUS, TYPE II (ICD-250.00) CONGESTIVE HEART FAILURE (ICD-428.0)  Review of Systems  The patient denies hypoglycemia.    Physical Exam  Skin:  insulin injection sites at anterior abdomen are normal  Additional Exam:  Hemoglobin A1C       [H]  8.2 %     Impression & Recommendations:  Problem # 1:  DIABETES MELLITUS, TYPE II (ICD-250.00) the pattern of his cbg's says he needs to take  some of his levemir in the evening.  Medications Added to Medication List This Visit: 1)  Aspirin 81 Mg Tbec (Aspirin) .... 1/2 tablet by mouth once daily 2)  Alphagan P 0.1 % Soln (Brimonidine tartrate) .... Use 1 drop in right eye two times a day 3)  Carvedilol 12.5 Mg Tabs (Carvedilol) .... Take 1/2 tablet by mouth two times a day once daily 4)  Trusopt 2 % Soln (Dorzolamide hcl) .... Use 1 drop in right eye two times a day 5)  Vitamin D3 5000 Unit Caps (Cholecalciferol) .Marland Kitchen.. 1 by mouth two times a day 6)  Levemir Flexpen 100 Unit/ml Soln (Insulin detemir) .... Take 65 units each am, and 5 units in the evening. 7)  Lumigan 0.01 % Soln (Bimatoprost) .Marland Kitchen.. 1 drop in right eye at bedtime  Other Orders: TLB-A1C / Hgb A1C (Glycohemoglobin) (83036-A1C) Est. Patient Level III [id number])  Patient Instructions: 1)  blood tests are being ordered for you today.  please call [phone] to hear your test results. 2)  pending the test results, please change levemir to 65 units am, and 5 units in the evening 3)  Please schedule a follow-up appointment in 3 months. 4)  on this type of insulin schedule, meals should not be missed or delayed.   5)  check your blood sugar 3 times a day.  vary the time of day when you check, between before the 3 meals, and at bedtime.  also check if you have symptoms of your blood sugar being too high or too low.  please keep a record of the readings and bring it to your next appointment here.  please call us sooner if you are having low blood sugar episodes. 6)  also take novolog 3 units as needed for any blood sugar over 250. 7)  (update: i left message on phone-tree:  rx as we discussed)   Orders Added: 1)  TLB-A1C / Hgb A1C (Glycohemoglobin) [83036-A1C] 2)  Est. Patient Level III [id number]

## 2010-07-22 NOTE — Progress Notes (Signed)
  Phone Note Call from Patient Call back at Home Phone [phone] Call back at Work Phone  Call back at [phone] or [phone]   Caller: Eddie/daughter Call For: Donavan Foil MD Summary of Call: Sugar has been in the 200' for 203 days, medicine is not bringing it down. Please let pt know asap. Initial call taken by: Denice Paradise,  March 22, 2010 2:29 PM  Follow-up for Phone Call        Pt states he has been taking insulin 3units for CBGs over 200 and it has not improved sugars, please advise. Follow-up by: Crissie Sickles, [place],  March 22, 2010 2:48 PM  Additional Follow-up for Phone Call Additional follow up Details #1::        increase levemir to 70 units each am.  ov here < 2 weeks. Additional Follow-up by: Donavan Foil MD,  March 22, 2010 2:52 PM    Additional Follow-up for Phone Call Additional follow up Details #2::    Pt informed, and will call back to sch appt. Follow-up by: Crissie Sickles, CMA,  March 22, 2010 3:34 PM  New/Updated Medications: LEVEMIR FLEXPEN 100 UNIT/ML SOLN (INSULIN DETEMIR) 70 units each am, and pen needles once daily, #30

## 2010-07-22 NOTE — Assessment & Plan Note (Signed)
Summary: 3 MO ROV /NWS #   Vital Signs:  Patient profile:   77 year old male Height:      72 inches (182.88 cm) Weight:      201.25 pounds (91.48 kg) BMI:     27.39 O2 Sat:      96 % on Room air Temp:     97.8 degrees F (36.56 degrees C) oral Pulse rate:   51 / minute BP sitting:   122 / 68  (left arm) Cuff size:   large  Vitals Entered By: Rebeca Alert MA (December 08, 2009 3:38 PM)  O2 Flow:  Room air CC: 3 mo f/u/pt brought in updated medication list/aj   Primary Provider:  [facility]  CC:  3 mo f/u/pt brought in updated medication list/aj.  History of Present Illness: pt states he feels well in general.  he brings a record of his cbg's which i have reviewed today.  it varies from 90-200, with no trend throughout the day.    Current Medications (verified): 1)  Aspirin 81 Mg  Tbec (Aspirin) .... Take 1 By Mouth Qd 2)  Xalatan 0.005 %  Soln (Latanoprost) .... Use 1 Drop Two Times A Day Right Eye 3)  Alphagan P 0.1 %  Soln (Brimonidine Tartrate) .... Use in Both Eyes 1 Drop Three Times A Day 4)  Cialis 20 Mg  Tabs (Tadalafil) .... Use Prn 5)  Norvasc 10 Mg  Tabs (Amlodipine Besylate) .... Take 1 By Mouth Qd 6)  Ditropan Xl 5 Mg  Tb24 (Oxybutynin Chloride) .... Take 1 By Mouth Qd 7)  Hydralazine Hcl 25 Mg  Tabs (Hydralazine Hcl) .... Take 1/2 Tab By Mouth Two Times A Day Qd 8)  Carvedilol 12.5 Mg  Tabs (Carvedilol) .... Take 1 By Mouth Two Times A Day Qd 9)  Furosemide 40 Mg  Tabs (Furosemide) .... Take 1 By Mouth Two Times A Day Qd 10)  Simvastatin 20 Mg  Tabs (Simvastatin) .... Take 1 By Mouth Qd 11)  Lisinopril 20 Mg  Tabs (Lisinopril) .... Take 1 1/2 Tab By Mouth Qd 12)  Uroxatral 10 Mg  Tb24 (Alfuzosin Hcl) .... Take 1 By Mouth Qd 13)  Align   Caps (Misc Intestinal Flora Regulat) .... Take 1 By Mouth Qd 14)  Testim 1 %  Gel (Testosterone) .... Use As Directed 15)  Oxybutynin Chloride 5 Mg  Tb24 (Oxybutynin Chloride) .... Take 1 By Mouth Qd 16)  Trusopt 2 %  Soln  (Dorzolamide Hcl) .... Use 1 Drop in (L) Ear Three Times A Day 17)  Vitamin D 1000 Unit  Tabs (Cholecalciferol) .... Take 1 Three Times A Day Qd 18)  Freestyle Test   Strp (Glucose Blood) .... Check Blood Sugar 4x A Day, and Lancets 250.01 19)  Bd U/f Mini Pen Needle 31g X 5 Mm Misc (Insulin Pen Needle) .... Check Blood Sugars 1-2 Times A Day 20)  Gentle Stool Softener 100 Mg Caps (Docusate Sodium) .... Take 1 By Mouth Qd 21)  Meloxicam 15 Mg Tabs (Meloxicam) .... Take 1 By Mouth Once Daily For Arthritis 22)  Glucosamine Sulfate 6000 Mg Tabs (Glucosamine Sulfate) .... Take 1 Two Times A Day 23)  Naftin 40 Gm .... Apply To Affected Area 24)  Magnesium .Marland Kitchen.. 1-2 Daily 25)  Levemir Flexpen 100 Unit/ml Soln (Insulin Detemir) .... 60 Units Each Am, and Pen Needles Once Daily, #30 26)  Miralax Stool Softner .... Once Daily As Needed  Allergies (verified): No Known Drug Allergies  Past History:  Past Medical History: Last updated: 08/27/2009 Dyslipidemia Glaucoma CVA Left eye DM Retinopathy  HYPERTENSION (ICD-401.9) GERD (ICD-530.81) DIABETES MELLITUS, TYPE II (ICD-250.00) CONGESTIVE HEART FAILURE (ICD-428.0)  Review of Systems       he had only 1 epoisode of hypoglycemia, and this was mild (when he was on special diet prior to his colonoscopy 7 weeks ago)  Physical Exam  General:  normal appearance.   Skin:  insulin injection sites at anterior abdomen are normal  Additional Exam:  Hemoglobin A1C       [H]  8.6 %    Impression & Recommendations:  Problem # 1:  DIABETES MELLITUS, TYPE II (ICD-250.00) needs increased rx  Medications Added to Medication List This Visit: 1)  Levemir Flexpen 100 Unit/ml Soln (Insulin detemir) .... 3 units as needed for any cbg over 200  Other Orders: TLB-A1C / Hgb A1C (Glycohemoglobin) (83036-A1C) Est. Patient Level III [id number])  Patient Instructions: 1)  blood tests are being ordered for you today.  please call [phone] to hear your test  results. 2)  pending the test results, please continue the same insulin for now 3)  Please schedule a follow-up appointment in 3 months. 4)  based on the result, i may advise you to take "humalog" in addition to levemir, as needed for high blood sugar. 5)  (update: i left message on phone-tree:  same levemir.  add novolog 3 units as needed for any cbg over 200) Prescriptions: LEVEMIR FLEXPEN 100 UNIT/ML SOLN (INSULIN DETEMIR) 3 units as needed for any cbg over 200  #1 box x 11   Entered and Authorized by:   Donavan Foil MD   Signed by:   Donavan Foil MD on 12/08/2009   Method used:   Electronically to        [facility] (retail)       [place], [place]  [id number]       Ph: [phone]       Fax: [phone]   RxID:   [id number]

## 2010-07-22 NOTE — Progress Notes (Signed)
Summary: CBG?  Phone Note Call from Patient   Caller: Patient [phone] Summary of Call: pt called stating that current dose of Insulin 50u qam is not sufficient and he beleives he needs medication for the pm as well. pt says his CBG is 280 and is 300+ after eating. pt is also requesting to test blood sugars more than 2 x day due to increase after a meal. please advise. Initial call taken by: Crissie Sickles, [place],  August 31, 2009 3:33 PM  Follow-up for Phone Call        increase to 60 units each am.  check your blood sugar 3 times a day.  vary the time of day when you check, between before the 3 meals, and at bedtime.  also check if you have symptoms of your blood sugar being too high or too low.  please keep a record of the readings and bring it to your next appointment here.  please call us sooner if you are having low blood sugar episodes. Follow-up by: Donavan Foil MD,  August 31, 2009 3:41 PM  Additional Follow-up for Phone Call Additional follow up Details #1::        pt informed Additional Follow-up by: Crissie Sickles, CMA,  August 31, 2009 4:55 PM    New/Updated Medications: LEVEMIR FLEXPEN 100 UNIT/ML SOLN (INSULIN DETEMIR) 60 units each am, and pen needles once daily, #30

## 2010-07-22 NOTE — Assessment & Plan Note (Signed)
Summary: fluctuating blood sugar/cd   Vital Signs:  Patient profile:   77 year old male Height:      72 inches (182.88 cm) Weight:      207.13 pounds (94.15 kg) O2 Sat:      95 % on Room air Temp:     98.0 degrees F (36.67 degrees C) oral Pulse rate:   63 / minute BP sitting:   108 / 58  (left arm) Cuff size:   large  Vitals Entered By: Gardenia Phlegm RMA (September 25, 2009 10:34 AM)  O2 Flow:  Room air CC: Blurred vision after taking morning insulin/ Pt states he is no longer using the Gentle Stool Softner/ CF Is Patient Diabetic? Yes   Primary Provider:  [facility]  CC:  Blurred vision after taking morning insulin/ Pt states he is no longer using the Gentle Stool Softner/ CF.  History of Present Illness: he brings a record of his cbg's which i have reviewed today.  it varies from 80-300, with no trend throughout the day.  however, mist cbg's are in the 100's.  Current Medications (verified): 1)  Aspirin 81 Mg  Tbec (Aspirin) .... Take 1 By Mouth Qd 2)  Xalatan 0.005 %  Soln (Latanoprost) .... Use 1 Drop Two Times A Day Right Eye 3)  Alphagan P 0.1 %  Soln (Brimonidine Tartrate) .... Use in Both Eyes 1 Drop Three Times A Day 4)  Cialis 20 Mg  Tabs (Tadalafil) .... Use Prn 5)  Norvasc 10 Mg  Tabs (Amlodipine Besylate) .... Take 1 By Mouth Qd 6)  Ditropan Xl 5 Mg  Tb24 (Oxybutynin Chloride) .... Take 1 By Mouth Qd 7)  Hydralazine Hcl 25 Mg  Tabs (Hydralazine Hcl) .... Take 1/2 Tab By Mouth Two Times A Day Qd 8)  Carvedilol 12.5 Mg  Tabs (Carvedilol) .... Take 1 By Mouth Two Times A Day Qd 9)  Furosemide 40 Mg  Tabs (Furosemide) .... Take 1 By Mouth Two Times A Day Qd 10)  Simvastatin 20 Mg  Tabs (Simvastatin) .... Take 1 By Mouth Qd 11)  Lisinopril 20 Mg  Tabs (Lisinopril) .... Take 1 1/2 Tab By Mouth Qd 12)  Uroxatral 10 Mg  Tb24 (Alfuzosin Hcl) .... Take 1 By Mouth Qd 13)  Align   Caps (Misc Intestinal Flora Regulat) .... Take 1 By Mouth Qd 14)  Testim 1 %  Gel (Testosterone)  .... Use As Directed 15)  Oxybutynin Chloride 5 Mg  Tb24 (Oxybutynin Chloride) .... Take 1 By Mouth Qd 16)  Trusopt 2 %  Soln (Dorzolamide Hcl) .... Use 1 Drop in (L) Ear Three Times A Day 17)  Vitamin D 1000 Unit  Tabs (Cholecalciferol) .... Take 1 Three Times A Day Qd 18)  Freestyle Test   Strp (Glucose Blood) .... Check Blood Sugar 4x A Day, and Lancets 250.01 19)  Bd U/f Mini Pen Needle 31g X 5 Mm Misc (Insulin Pen Needle) .... Check Blood Sugars 1-2 Times A Day 20)  Gentle Stool Softener 100 Mg Caps (Docusate Sodium) .... Take 1 By Mouth Qd 21)  Meloxicam 15 Mg Tabs (Meloxicam) .... Take 1 By Mouth Once Daily For Arthritis 22)  Glucosamine Sulfate 6000 Mg Tabs (Glucosamine Sulfate) .... Take 1 Two Times A Day 23)  Naftin 40 Gm .... Apply To Affected Area 24)  Magnesium .Marland Kitchen.. 1-2 Daily 25)  Levemir Flexpen 100 Unit/ml Soln (Insulin Detemir) .... 60 Units Each Am, and Pen Needles Once Daily, #30 26)  Miralax  Stool Softner .... Once Daily As Needed  Allergies (verified): No Known Drug Allergies  Past History:  Past Medical History: Last updated: 08/27/2009 Dyslipidemia Glaucoma CVA Left eye DM Retinopathy  HYPERTENSION (ICD-401.9) GERD (ICD-530.81) DIABETES MELLITUS, TYPE II (ICD-250.00) CONGESTIVE HEART FAILURE (ICD-428.0)  Review of Systems  The patient denies hypoglycemia.    Physical Exam  General:  normal appearance.   Pulses:  dorsalis pedis intact bilat.   Extremities:  no deformity.  no ulcer on the feet.  feet are of normal color and temp.  1+ right pedal edema and 1+ left pedal edema.  there is bilateral onychomycosis. Neurologic:  sensation is intact to touch on the feet.  Additional Exam:  fructosamine=391 (converts to a1c of 8.3)   Impression & Recommendations:  Problem # 1:  DIABETES MELLITUS, TYPE II (ICD-250.00) given his need for a simple regimen, this glycemic control is good.    Medications Added to Medication List This Visit: 1)  Miralax Stool  Softner  .... Once daily as needed  Other Orders: T-Fructosamine [id number]) Est. Patient Level III [id number])  Patient Instructions: 1)  tests are being ordered for you today.  a few days after the test(s), please call [phone] to hear your test results. 2)  pending the test results, please continue levemir 60 units each am, and none later in the day.  3)  check your blood sugar 4 times a day.  check 3x a day, varying the time of day when you check, between before the 3 meals, and at bedtime.  also check if you have symptoms of your blood sugar being too high or too low.  please keep a record of the readings and bring it to your next appointment here.  please call us sooner if you are having low blood sugar episodes. 4)  Please schedule a follow-up appointment in 3 months.

## 2010-07-23 ENCOUNTER — Telehealth: Payer: Self-pay | Admitting: Endocrinology

## 2010-07-28 NOTE — Progress Notes (Signed)
Summary: pen needles  Phone Note From Pharmacy   Caller: [facility]* Summary of Call: Pt needs new rx for pen needles Initial call taken by: Rebeca Alert CMA ([place]),  July 23, 2010 3:15 PM    New/Updated Medications: BD U/F MINI PEN NEEDLE 31G X 5 MM MISC (INSULIN PEN NEEDLE) use as directed 4x a day Prescriptions: BD U/F MINI PEN NEEDLE 31G X 5 MM MISC (INSULIN PEN NEEDLE) use as directed 4x a day  #180 x 5   Entered by:   Rebeca Alert CMA ([place])   Authorized by:   Donavan Foil MD   Signed by:   Rebeca Alert CMA ([place]) on 07/23/2010   Method used:   Electronically to        [facility] (retail)       [place], [place]  [id number]       Ph: [phone]       Fax: [phone]   RxID:   [id number]

## 2010-08-13 ENCOUNTER — Telehealth: Payer: Self-pay | Admitting: Endocrinology

## 2010-08-16 ENCOUNTER — Telehealth (INDEPENDENT_AMBULATORY_CARE_PROVIDER_SITE_OTHER): Payer: Self-pay | Admitting: *Deleted

## 2010-08-16 ENCOUNTER — Encounter: Payer: Self-pay | Admitting: Endocrinology

## 2010-08-16 ENCOUNTER — Ambulatory Visit (INDEPENDENT_AMBULATORY_CARE_PROVIDER_SITE_OTHER): Payer: Medicare Other | Admitting: Endocrinology

## 2010-08-16 DIAGNOSIS — E119 Type 2 diabetes mellitus without complications: Secondary | ICD-10-CM

## 2010-08-17 ENCOUNTER — Ambulatory Visit: Payer: Self-pay | Admitting: Endocrinology

## 2010-08-17 NOTE — Progress Notes (Signed)
Summary: ?regarding insulin med  Phone Note Call from Patient Call back at Home Phone [phone]   Caller: Patient---[phone] Call For: Dr Loanne Drilling Summary of Call: Pt requests a call regarding his sugar,he has questions about taking levemir, flex pen and wants to know if it needs to refrigerated and dosage. Initial call taken by: Denice Paradise,  August 13, 2010 3:13 PM  Follow-up for Phone Call        Pt states that his CBG's are elevated in the mornings after he eats breakfast so he takes a dose of Novolog to bring it down. Pt wants to know if his dosage in the morning needs to be increase Follow-up by: Rebeca Alert CMA Deborra Medina),  August 13, 2010 4:40 PM  Additional Follow-up for Phone Call Additional follow up Details #1::        the answers th-o these depend on the pattern of his cbg's throughout the day.  ov next week.  bring cbg record Additional Follow-up by: Donavan Foil MD,  August 13, 2010 5:07 PM    Additional Follow-up for Phone Call Additional follow up Details #2::    Pt advised and will call back Monday to schedule appt Follow-up by: Crissie Sickles, [place],  August 13, 2010 5:10 PM

## 2010-08-26 NOTE — Progress Notes (Signed)
  Phone Note Call from Patient Call back at Home Phone [phone]   Caller: Patient---[phone] Call For: Dr Loanne Drilling Summary of Call: See prior note EMR. Sugar over 219 today, feels like vision is affected by this, requests appointtment today, please advise.There are currently nop openings in clinic today w/any physician's. Initial call taken by: Denice Paradise,  August 16, 2010 10:14 AM  Follow-up for Phone Call        please add-on this afternoon Follow-up by: Donavan Foil MD,  August 16, 2010 10:32 AM  Additional Follow-up for Phone Call Additional follow up Details #1::        Appt set for 1pm today, pt aware. Additional Follow-up by: Denice Paradise,  August 16, 2010 11:15 AM

## 2010-08-26 NOTE — Assessment & Plan Note (Signed)
Summary: ELEV BS /NWS   Vital Signs:  Patient profile:   77 year old male Height:      721 inches Weight:      201.50 pounds BMI:     0.27 O2 Sat:      97 % on Room air Temp:     98.0 degrees F oral Pulse rate:   75 / minute BP sitting:   124 / 68  (left arm) Cuff size:   regular  Vitals Entered By: Crissie Sickles, CMA (August 16, 2010 1:22 PM)  O2 Flow:  Room air CC: Elevated CBG   Primary Provider:  [facility]  CC:  Elevated CBG.  History of Present Illness: pt states he feels well in general, except for some anxiety.  he brings a record of his cbg's which i have reviewed today.  it varies from 121-300, with no trend throughout the day.  it is lowest after taking the novolog.    Allergies: No Known Drug Allergies  Past History:  Past Medical History: Last updated: 08/27/2009 Dyslipidemia Glaucoma CVA Left eye DM Retinopathy  HYPERTENSION (ICD-401.9) GERD (ICD-530.81) DIABETES MELLITUS, TYPE II (ICD-250.00) CONGESTIVE HEART FAILURE (ICD-428.0)  Review of Systems  The patient denies hypoglycemia.    Physical Exam  General:  normal appearance.   Pulses:  dorsalis pedis intact bilat.   Extremities:  no deformity.  no ulcer on the feet.  feet are of normal color and temp.  1+ right pedal edema and 1+ left pedal edema.  there is bilateral onychomycosis. Neurologic:  sensation is intact to touch on the feet.    Impression & Recommendations:  Problem # 1:  DIABETES MELLITUS, TYPE II (ICD-250.00) based on his cbg's he needs some slight adjustment in his insulin therapy  Medications Added to Medication List This Visit: 1)  Levemir Flexpen 100 Unit/ml Soln (Insulin detemir) .... Take 67 units each am, and 7 units in the evening. 2)  Novolog Flexpen 100 Unit/ml Soln (Insulin aspart) .... 2 units as needed for any blood sugar over 250  Other Orders: Est. Patient Level III [id number])  Patient Instructions: 1)  change levemir to 67 units am, and 7 units in  the evening 2)  Please schedule a follow-up appointment in 1 month. 3)  on this type of insulin schedule, meals should not be missed or delayed.   4)  check your blood sugar 3 times a day.  vary the time of day when you check, between before the 3 meals, and at bedtime.  also check if you have symptoms of your blood sugar being too high or too low.  please keep a record of the readings and bring it to your next appointment here.  please call us sooner if you are having low blood sugar episodes. 5)  also take novolog just 2 units as needed for any blood sugar over 250.   Orders Added: 1)  Est. Patient Level III [id number]

## 2010-09-10 ENCOUNTER — Other Ambulatory Visit (INDEPENDENT_AMBULATORY_CARE_PROVIDER_SITE_OTHER): Payer: BC Managed Care – PPO | Admitting: Endocrinology

## 2010-09-10 DIAGNOSIS — E119 Type 2 diabetes mellitus without complications: Secondary | ICD-10-CM

## 2010-10-07 ENCOUNTER — Ambulatory Visit (INDEPENDENT_AMBULATORY_CARE_PROVIDER_SITE_OTHER): Payer: Medicare Other | Admitting: Endocrinology

## 2010-10-07 ENCOUNTER — Encounter: Payer: Self-pay | Admitting: Endocrinology

## 2010-10-07 ENCOUNTER — Other Ambulatory Visit (INDEPENDENT_AMBULATORY_CARE_PROVIDER_SITE_OTHER): Payer: Medicare Other

## 2010-10-07 VITALS — BP 124/64 | HR 52 | Temp 98.5°F | Ht 72.0 in | Wt 201.1 lb

## 2010-10-07 DIAGNOSIS — E119 Type 2 diabetes mellitus without complications: Secondary | ICD-10-CM

## 2010-10-07 DIAGNOSIS — Z79899 Other long term (current) drug therapy: Secondary | ICD-10-CM

## 2010-10-07 LAB — HEMOGLOBIN A1C: Hgb A1c MFr Bld: 9.2 % — ABNORMAL HIGH (ref 4.6–6.5)

## 2010-10-07 NOTE — Patient Instructions (Addendum)
blood tests are being ordered for you today.  please call [phone] to hear your test results. pending the test results, please continue the same medications for now Please make a follow-up appointment in 3 months check your blood sugar 2 times a day.  vary the time of day when you check, between before the 3 meals, and at bedtime.  also check if you have symptoms of your blood sugar being too high or too low.  please keep a record of the readings and bring it to your next appointment here.  please call us sooner if you are having low blood sugar episodes. good diet and exercise habits significanly improve the control of your diabetes.  please let me know if you wish to be referred to a dietician.  high blood sugar is very risky to your health.  you should see an eye doctor every year. controlling your blood pressure and cholesterol drastically reduces the damage diabetes does to your body.  this also applies to quitting smoking.  please discuss these with your doctor.  you should take an aspirin every day, unless you have been advised by a doctor not to. (update: i left message on phone-tree:  You should consider qd lantus)

## 2010-10-07 NOTE — Progress Notes (Signed)
  Subjective:    Patient ID: Gregory Hatfield, male    DOB: 04-Feb-1926, 77 y.o.   MRN: [id number]  HPI pt states he feels well in general  he will have cataract surgery next week.  he brings a record of his cbg's which i have reviewed today.  It varies from 65-200, with no trend throughout the day.  Past Medical History  Diagnosis Date  . Diabetes mellitus   . CHF (congestive heart failure)   . GERD (gastroesophageal reflux disease)   . Hypertension   . Dyslipidemia   . Glaucoma   . CVA (cerebral infarction)     Left Eye  . Diabetic retinopathy    No past surgical history on file.  reports that he has quit smoking. He does not have any smokeless tobacco history on file. His alcohol and drug histories not on file. family history is not on file. Allergies  Allergen Reactions  . Demerol   . Flomax (Tamsulosin Hcl)     Review of Systems denies hypoglycemia    Objective:   Physical Exam GENERAL: no distress Neck: i do not appreciate a nodule in the thyroid or elsewhere in the neck.       Assessment & Plan:  Dm.  He may benefit from the simplicity of qd lantus, but the fact that he needs so much more levemir during there day that at night makes me not sure about this.

## 2010-10-08 ENCOUNTER — Encounter (HOSPITAL_COMMUNITY)
Admission: RE | Admit: 2010-10-08 | Discharge: 2010-10-08 | Disposition: A | Discharge status: Home / Self Care | Payer: Medicare Other | Source: Ambulatory Visit | Attending: Ophthalmology | Admitting: Ophthalmology

## 2010-10-08 LAB — CBC
HCT: 37.4 % — ABNORMAL LOW (ref 39.0–52.0)
Hemoglobin: 12.1 g/dL — ABNORMAL LOW (ref 13.0–17.0)
MCV: 90.6 fL (ref 78.0–100.0)
RBC: 4.13 MIL/uL — ABNORMAL LOW (ref 4.22–5.81)
RDW: 13.3 % (ref 11.5–15.5)
WBC: 6.7 10*3/uL (ref 4.0–10.5)

## 2010-10-08 LAB — BASIC METABOLIC PANEL
CO2: 27 mEq/L (ref 19–32)
Chloride: 110 mEq/L (ref 96–112)
GFR calc non Af Amer: 44 mL/min — ABNORMAL LOW (ref >60)
Glucose, Bld: 274 mg/dL — ABNORMAL HIGH (ref 70–99)
Potassium: 3.9 mEq/L (ref 3.5–5.1)
Sodium: 142 mEq/L (ref 135–145)

## 2010-10-13 ENCOUNTER — Ambulatory Visit (HOSPITAL_COMMUNITY)
Admission: RE | Admit: 2010-10-13 | Discharge: 2010-10-13 | Disposition: A | Discharge status: Home / Self Care | Payer: Medicare Other | Source: Ambulatory Visit | Attending: Ophthalmology | Admitting: Ophthalmology

## 2010-10-13 DIAGNOSIS — H269 Unspecified cataract: Secondary | ICD-10-CM | POA: Insufficient documentation

## 2010-10-13 DIAGNOSIS — E1139 Type 2 diabetes mellitus with other diabetic ophthalmic complication: Secondary | ICD-10-CM | POA: Insufficient documentation

## 2010-10-13 DIAGNOSIS — I509 Heart failure, unspecified: Secondary | ICD-10-CM | POA: Insufficient documentation

## 2010-10-13 DIAGNOSIS — H409 Unspecified glaucoma: Secondary | ICD-10-CM | POA: Insufficient documentation

## 2010-10-13 DIAGNOSIS — I10 Essential (primary) hypertension: Secondary | ICD-10-CM | POA: Insufficient documentation

## 2010-10-13 DIAGNOSIS — Z01810 Encounter for preprocedural cardiovascular examination: Secondary | ICD-10-CM | POA: Insufficient documentation

## 2010-10-13 DIAGNOSIS — Z01812 Encounter for preprocedural laboratory examination: Secondary | ICD-10-CM | POA: Insufficient documentation

## 2010-10-13 DIAGNOSIS — E11359 Type 2 diabetes mellitus with proliferative diabetic retinopathy without macular edema: Secondary | ICD-10-CM | POA: Insufficient documentation

## 2010-10-19 HISTORY — PX: PROSTATE SURGERY: SHX751

## 2010-10-26 ENCOUNTER — Other Ambulatory Visit: Payer: Self-pay | Admitting: Urology

## 2010-10-26 ENCOUNTER — Encounter (HOSPITAL_COMMUNITY): Payer: Medicare Other

## 2010-10-26 DIAGNOSIS — Z8673 Personal history of transient ischemic attack (TIA), and cerebral infarction without residual deficits: Secondary | ICD-10-CM | POA: Insufficient documentation

## 2010-10-26 DIAGNOSIS — N400 Enlarged prostate without lower urinary tract symptoms: Secondary | ICD-10-CM | POA: Insufficient documentation

## 2010-10-26 DIAGNOSIS — I10 Essential (primary) hypertension: Secondary | ICD-10-CM | POA: Insufficient documentation

## 2010-10-26 DIAGNOSIS — E119 Type 2 diabetes mellitus without complications: Secondary | ICD-10-CM | POA: Insufficient documentation

## 2010-10-26 DIAGNOSIS — I509 Heart failure, unspecified: Secondary | ICD-10-CM | POA: Insufficient documentation

## 2010-10-26 DIAGNOSIS — Z7982 Long term (current) use of aspirin: Secondary | ICD-10-CM | POA: Insufficient documentation

## 2010-10-26 DIAGNOSIS — Z794 Long term (current) use of insulin: Secondary | ICD-10-CM | POA: Insufficient documentation

## 2010-10-26 DIAGNOSIS — Z01812 Encounter for preprocedural laboratory examination: Secondary | ICD-10-CM | POA: Insufficient documentation

## 2010-10-26 DIAGNOSIS — Z79899 Other long term (current) drug therapy: Secondary | ICD-10-CM | POA: Insufficient documentation

## 2010-10-26 LAB — CBC
Hemoglobin: 13.1 g/dL (ref 13.0–17.0)
MCV: 89.6 fL (ref 78.0–100.0)
Platelets: 171 10*3/uL (ref 150–400)
RBC: 4.52 MIL/uL (ref 4.22–5.81)
WBC: 6.6 10*3/uL (ref 4.0–10.5)

## 2010-10-26 LAB — BASIC METABOLIC PANEL
BUN: 27 mg/dL — ABNORMAL HIGH (ref 6–23)
CO2: 29 mEq/L (ref 19–32)
Chloride: 103 mEq/L (ref 96–112)
Potassium: 4 mEq/L (ref 3.5–5.1)

## 2010-10-26 LAB — SURGICAL PCR SCREEN
MRSA, PCR: NEGATIVE
Staphylococcus aureus: NEGATIVE

## 2010-11-01 ENCOUNTER — Other Ambulatory Visit: Payer: Self-pay | Admitting: Urology

## 2010-11-01 ENCOUNTER — Ambulatory Visit (HOSPITAL_COMMUNITY)
Admission: RE | Admit: 2010-11-01 | Discharge: 2010-11-01 | Disposition: A | Discharge status: Home / Self Care | Payer: Medicare Other | Source: Ambulatory Visit | Attending: Urology | Admitting: Urology

## 2010-11-01 DIAGNOSIS — N138 Other obstructive and reflux uropathy: Secondary | ICD-10-CM | POA: Insufficient documentation

## 2010-11-01 DIAGNOSIS — Z01812 Encounter for preprocedural laboratory examination: Secondary | ICD-10-CM | POA: Insufficient documentation

## 2010-11-01 DIAGNOSIS — I69998 Other sequelae following unspecified cerebrovascular disease: Secondary | ICD-10-CM | POA: Insufficient documentation

## 2010-11-01 DIAGNOSIS — N401 Enlarged prostate with lower urinary tract symptoms: Secondary | ICD-10-CM | POA: Insufficient documentation

## 2010-11-01 DIAGNOSIS — I509 Heart failure, unspecified: Secondary | ICD-10-CM | POA: Insufficient documentation

## 2010-11-01 DIAGNOSIS — H539 Unspecified visual disturbance: Secondary | ICD-10-CM | POA: Insufficient documentation

## 2010-11-01 DIAGNOSIS — I10 Essential (primary) hypertension: Secondary | ICD-10-CM | POA: Insufficient documentation

## 2010-11-01 LAB — GLUCOSE, CAPILLARY: Glucose-Capillary: 240 mg/dL — ABNORMAL HIGH (ref 70–99)

## 2010-11-02 NOTE — Op Note (Signed)
NAME:  Gregory Hatfield, Gregory Hatfield             ACCOUNT NO.:  [id number]  MEDICAL RECORD NO.:  [id number]           PATIENT TYPE:  O  LOCATION:  PADM                         FACILITY:  [facility]  PHYSICIAN:  Sewell Pitner I. Gaynelle Arabian, M.D.DATE OF BIRTH:  08/07/1925  DATE OF PROCEDURE: DATE OF DISCHARGE:  10/26/2010                              OPERATIVE REPORT   PREOPERATIVE DIAGNOSIS:  Benign prostatic hypertrophy.  POSTOPERATIVE DIAGNOSIS:  Benign prostatic hypertrophy.  OPERATION:  Holmium laser transurethral resection of prostate.  SURGEON:  Sheldon Amara I. Gaynelle Arabian, MD  ANESTHESIA:  General LMA.  PREPARATION:  After preoperative preanesthesia, the patient was brought to the operating room and placed on the operating table in dorsal supine position where general LMA anesthesia was introduced.  He was then replaced to dorsal lithotomy position, where the pubis was prepped with Betadine solution and draped in usual fashion.  REVIEW OF HISTORY:  Gregory Hatfield is an 77 year old male, complaining of urinary frequency, urgency, postvoid dribbling, failing medication therapy.  He is a diabetic with glaucoma, GERD, intestinal stenosis, and history of ischemic stroke.  Video urodynamics shows the patient has a bladder capacity of 106 cc, and is unstable.  He has a maximum flow rate of 6 cc per second with a detrusor pressure of maximum flow of 89 cm water.  VCG showed closed bladder neck.  He appeared to have small capacity bladder which was unstable and was obstructive.  His cystoscopy shows large overgrowth of his right lateral lobe but some tissue also on his left side.  He appears to have bladder neck contracture from previous prostate surgery. He is now for holmium laser TURP.  DESCRIPTION OF PROCEDURE:  Using the holmium laser, cystoscopy was accomplished, it shows that the patient has a normal-appearing bladder with no evidence of bladder stone, tumor, or diverticular inflammation. He did have  some trabeculation but no cellules were noted.  The bladder neck was noted to be scarred.  The right lateral lobe was greatly enlarged, more than the left, which was somewhat enlarged.  Incision was made at the 7 o'clock position, incising the bladder neck contracture and also undermining the right lateral lobe.  This incision was carried to the level of the ureter and was dissected up the right lateral wall. A second incision was then made at 12 o'clock, allowing the lateral lobe to drop down.  Pieces of lateral lobe were cut.  Across the midline, some tissue was left, this was cleaned up with lasing of small pieces. The left lateral lobe was then lased, but left intact.  Chips floated in the bladder, and these were evacuated and sent to the laboratory for examination.  Minimal bleeding was noted.  A 24 Ainsworth catheter was left.  Traction will be applied as needed.  The patient tolerated the procedure well and was allowed to be awaken and taken to recovery in good condition.  He was not given B and O suppository because of the history of allergy to morphine.  Note, he was given a Toradol at the end of the case.     Copper Kirtley I. Gaynelle Arabian, M.D.     SIT/MEDQ  D:  11/01/2010  T:  11/01/2010  Job:  [id number]  Electronically Signed by Carolan Clines M.D. on 11/02/2010 05:10:34 PM

## 2010-11-02 NOTE — Consult Note (Signed)
[facility]                          ENDOCRINOLOGY CONSULTATION   DERRIK, HAUSKINS                    MRN:          [id number]  DATE:01/03/2007                            DOB:          06-24-1925    REASON FOR VISIT:  Followup diabetes.   HISTORY OF PRESENT ILLNESS:  A 77 year old man who states his diabetes  control is better since the adjustments that were recently made.  He  likes the simpler twice a day insulin schedule and states he is more  able to keep track of this.   PAST MEDICAL HISTORY:  1. Diabetes retinopathy.  2. CVA effecting the left eye.  3. CHF.  4. Glaucoma.  5. GERD.  6. BPH.  7. Hypertension.  8. Dyslipidemia.   REVIEW OF SYSTEMS:  Two episodes of mild hypoglycemia at h.s. in the  last month.   PHYSICAL EXAMINATION:  VITAL SIGNS:  Blood pressure is 117/57, heart  rate 52, temperature is 97.9, the weight is 214.  GENERAL:  No distress.  FEET:  Normal color and temperature.  There is no ulcer on the feet.  He  has bilateral onychomycosis.  Sensation is intact to touch in the feet  and pedal pulses are intact.   LABORATORY STUDIES:  On January 03, 2007, hemoglobin A1c 8.4.   IMPRESSION:  He needs some adjustment in his insulin.   PLAN:  1. Change 75/25 insulin to 40 units q.a.m. and 15 units q.p.m.  2. Return 4 months.     Sean A. Loanne Drilling, MD  Electronically Signed    SAE/MedQ  DD: 01/07/2007  DT: 01/07/2007  Job #: [id number]   cc:   [name], M.D.

## 2010-11-05 NOTE — Discharge Summary (Signed)
NAME:  Gregory Hatfield, Gregory Hatfield             ACCOUNT NO.:  [id number]   MEDICAL RECORD NO.:  [id number]          PATIENT TYPE:  INP   LOCATION:  [id number]                         FACILITY:  [facility]   PHYSICIAN:  Barbette Merino, M.D.      DATE OF BIRTH:  [birth year]   DATE OF ADMISSION:  06/04/2006  DATE OF DISCHARGE:                               DISCHARGE SUMMARY   DATE OF DISCHARGE:  To be determined.   PRIMARY CARE PHYSICIAN:  [name], MD   DISCHARGE DIAGNOSES:  1. Acute on chronic renal failure, presumed cause renal with evidence      of hydronephrosis.  2. Drug-induced bradycardia.  3. Transient urinary tract infection.  4. Hypertension.  5. Diabetes type 2.  6. Anemia of chronic disease.  7. Constipation.  8. Chronic dry cough.  9. CHF (congestive heart failure) with diastolic dysfunction.  10.Protein-calorie malnutrition.   DISCHARGE MEDICATIONS:  Currently on:  1. Aspirin 81 mg daily.  2. Senokot-S 1 tablet q.h.s.  3. Claritin 10 mg p.o. daily.  4. Acular 0.5% opthalmic solution, 1 drop 4 times a day.  5. Alphagan 0.2% three times a day.  6. Trusopt 2% twice a day.  7. Timoptic 0.5% twice a day.  8. Prednisolone 1% four times a day.  9. Flomax 0.4 mg at night.  10.Anusol ointment twice a day.  11.Hydralazine 25 mg t.i.d.  12.Dulcolax 5 mg b.i.d.  13.Lantus 47 units at night.  14.Norvasc 10 mg q.h.s.  15.Lasix 80 mg twice a day.   DISPOSITION:  At this point, patient is awaiting urology input.  If  urology is not going to do anything further for the patient, we will  discharge him home to follow up with them as an outpatient.  He is being  followed by Dr. Gaynelle Arabian.  His kidney function has remained around 2.7-  2.8 and is not changing.   PROCEDURES PERFORMED THIS ADMISSION:  1. Chest x-ray on June 04, 2006, showed no acute cardiopulmonary      findings.  2. A 2D echo on June 05, 2006, showed normal EF of 60-70%.  No      other significant finding.  3. V/Q  scan on June 05, 2006, shows low probability scan for      pulmonary emboli.  4. Renal ultrasound on June 05, 2006, showed mild left      hydronephrosis with findings suggesting nonspecific renal disease.      Repeat renal ultrasound on June 09, 2006, shows slight decrease      in mild left hydronephrosis.  5. Chest x-ray on June 11, 2006, showed linear atelectasis      medially at the left base with hyperaeration, no active lung      disease.   CONSULTATIONS:  1. Carolan Clines, MD, Urology.  2. Selena Batten, MD, [facility], Nephrology.   HISTORY OF PRESENT ILLNESS:  Please refer to dictated history and  physical on admission by Dr. Scharlene Gloss.  It showed, however, this is  a 77 year old male with history of CVA affecting his left side and CAD  with cardiac cath  in the past.  Patient presented with 2 weeks of  progressive shortness of breath with some exertion.  This was thought to  be new for patient.  At the time of admission, patient was found to have  stable vital signs.  His pulse, however, was only 40s, hence his EKG  showed marked sinus bradycardia.  He was subsequently admitted.  Other  findings, on his labs, shows his BUN of 63, creatinine of 3.0.  Previous  creatinine level was 1.4.  Patient's hemoglobin was also 11.5 at the  time.   HOSPITAL COURSE:  1. Bradycardia.  Patient was discovered to be on atenolol at the time      of admission.  He is also on some precaution channel blocker      presumably.  This was stopped and his heart rate has since      rebounded.  It was first believed that his bradycardia was drug      induced.  Cardiology consult was sought from [facility].  The patient was suppose to stop beta blocker was      more than adequate.  Subsequently, patient has been off any rate      lowering drugs at this point.  2. Diastolic dysfunction.  This is being followed by cardiology  as      indicated, with his EF being normal.  Patient responded to adequate      diuresis and is currently not short of breath at all.  3. Hypertension.  His blood pressure has risen once his atenolol was      discontinued.  We started hydralazine on the patient.  Nitrates      could be added at some point, if needed, for blood pressure      control.  4. GERD.  We continued his proton pump inhibitor in the hospital and      patient seemed to be stable.  5. Urinary retention.  As indicated, patient had an acute renal      failure from what appears to be urinary retention.  He currently      has an indwelling Foley catheter that seemed to relieve his urinary      retention.  He was also started on Flomax 0.4 mg daily.  Urology      was consulted due to his acute on chronic renal failure and the      ultrasound showing left-sided hydronephrosis.  At this point, final      disposition from urology has not been given.  Possibly cystoscopy      as an outpatient; if he is cleared by them and they are not willing      to do anything inpatient, we will discharge the patient.  6. Hemorrhoids.  Patient apparently had hemorrhoids and was having      problems.  He has been on Preparation H, that seems to have      resolved.  7. Constipation.  Again, this has resolved.  8. Diabetes.  Patient blood sugar has been stable initially and then      rose tremendously.  We have adjusted his diet and insulin and      currently his blood sugars be doing okay.  9. Glaucoma and left eye blindness.  Patient continued with his home      eye drops and that seems to have help resolve his problem at this      point.  Further addendum will be dictated to discharge summary at the time of  discharge.      Barbette Merino, M.D.  Electronically Signed     LG/MEDQ  D:  06/14/2006  T:  06/15/2006  Job:  [id number]

## 2010-11-05 NOTE — Discharge Summary (Signed)
NAME:  Gregory Hatfield, Gregory Hatfield             ACCOUNT NO.:  [id number]   MEDICAL RECORD NO.:  [id number]          PATIENT TYPE:  INP   LOCATION:  5523                         FACILITY:  [facility]   PHYSICIAN:  Mobolaji B. Bakare, M.D.DATE OF BIRTH:  June 10, 1926   DATE OF ADMISSION:  06/04/2006  DATE OF DISCHARGE:                               DISCHARGE SUMMARY   PRIMARY CARE PHYSICIAN:  Dr. Melford Aase.   CONSULTATIONS:  1. Nephrology consult, Dr. Mercy Moore.  2. Urology consult, Dr. Gaynelle Arabian.   ADDENDUM:  This is an addendum to an earlier dictation.  Please see  preview of interim discharge summary by Dr. Barbette Merino for other  details.   DISCHARGE MEDICATIONS:  1. Norvasc 10 mg p.o. q.h.s.  2. Anusol ointment, one two times a day.  3. Aspirin 81 mg daily.  4. Dulcolax 10 mg 2 times a day.  5. Alphagan 1 OP 3 times a day.  6. Trusopt 1 OP 2 times a day.  7. Apresoline 25 mg q.i.d.  8. Lantus 45 units q.h.s.  9. Humalog sliding scale with meals using Humalog 2 units for blood      glucose 101-150, 3 units for 151-200, 5 units for 201-250, 7 units      for 251-300, 9 units for 301-350, 11 units for 352-400.  No      coverage with 60-100 and patient to call his doctor if CBGs get      around 400.  10.Nu-Iron 150 b.i.d.  11.Acular 1 OP 4 times a day.  12.Claritin 10 mg daily.  13.Prednisolone OP 1 x4 times a day.  14.Senokot 1 tablet q.h.s.  15.Flomax 0.4 mg at bedtime.  16.Atenolol 1 OP 2 times a day.  17.Zocor to continue as before.   DISCONTINUED MEDICATIONS:  1. Lasix.  2. Atenolol.  3. Imipramine.   FOLLOWUP:  1. With Dr. Gaynelle Arabian in the next week.  2. Follow up with Dr. Mercy Moore in 2 to 3 weeks.  3. Follow up with Dr. Melford Aase in 1 to 2 weeks.   DISCHARGE CONDITION:  1. Stable.  Patient will be going home with Foley catheter secondary      to the urinary retention.  He will follow up with Dr. Gaynelle Arabian      next week.  2. Discussion.  Sequel to previous discharge summary  done on the 26th      of December 2007, patient remained stable.  He was awaiting      disposition plan from urology standpoint.  We continued to monitor      his renal function.  BUN and creatinine trended downward, and at      the time of discharge, BUN was 41, creatinine was 2.2, compared to      BUN of 63 and 3.0 at the time of admission.  This was felt in part      to be secondary to the urinary retention.  Patient does have      history of prostate cancer.  He has an indwelling Foley catheter      now, and this will remain in place until he sees  Dr. Gaynelle Arabian in      the office next week.  Patient will be followed up by home health      R.N.  3. Acute on-chronic renal failure.  Patient's Lasix has been      discontinued.  P.O. fluid intake was encouraged.  He is making      adequate urine.   He received iron infusion during the course of hospitalization, and he  is being continued on p.o. iron supplement.  He was not erythropoietin.   DISCHARGE LABORATORY DATA:  White cells 7.8, hemoglobin 10.9, hematocrit  32.9, sodium 134, potassium 3.7, chloride 102, bicarb 24, BUN 41,  creatinine 2.2, glucose 80.      Mobolaji B. Maia Petties, M.D.  Electronically Signed     MBB/MEDQ  D:  06/19/2006  T:  06/19/2006  Job:  [id number]   cc:   [name], M.D.  Sigmund I. Gaynelle Arabian, M.D.  Windy Kalata, M.D.

## 2010-11-05 NOTE — Discharge Summary (Signed)
[place]  Patient:    Gregory Hatfield, Gregory Hatfield Visit Number: [id number] MRN: [id number]          Service Type: SUR Location: [id number] Attending Physician:  Donia Guiles Dictated by:   Hosie Spangle, M.D. Admit Date:  10/08/2001 Discharge Date: 10/12/2001                             Discharge Summary  HISTORY OF PRESENT ILLNESS: The patient is a 77 year old man, status post L3 to L5 laminectomy in December of 1995. He did well from that surgery but returned with pain in his low back radiating to his left buttock. Some tingling down to the left lower extremity. MRI revealed a grade I degenerative spondylolisthesis of L3-4 and a grade I degenerative spondylolisthesis of L4-5. There was some lateral recent encroachment of L4-5 which was felt to be due to a synovial cyst. Myelogram and post myelogram CT scan confirmed the grade I L3-4 static spondylolisthesis and a moderate grade I dynamic L4-5 spondylolisthesis that increased with flexion and decreased with extension. Also bilateral synovial cyst at L4-5 with resulting stenosis of the canal were noted. The patient was admitted for an L3, L4, and L5 laminectomy, resection of the synovial cyst and L4-5 post interbody fusion with bone graft and possible L3-4 post lumber interbody fusion and an L3-5 posterior lateral arthrodesis with bone grafts and posterior instrumentation.  PAST MEDICAL HISTORY: 1. Hypertension. 2. Diabetes. 3. Hiatal hernia. 4. Acid reflux.  MEDICATIONS: Are as listed on the admission H&P examination.  PHYSICAL EXAMINATION:  GENERAL: Unremarkable.  NEUROLOGIC: Showed intact strength and sensation.  HOSPITAL COURSE: The patient was admitted and underwent an L3, L4, and L5 lumbar laminectomy and L4-5 diskectomy with posterior lumbar interbody fusion with femoral shaft allograft with vitoss and an L3-5 posterolateral arthrodesis with 90 degree posterior  instrumentation and Vitoss. His synovial cysts were resected at the time of his laminectomy. Postoperatively, he has done well. We were able to discontinue his PCA and Foley on the first postop day. He did have some bloody drainage from his wounds and we applied a pressure dressing. He was able to steadily increase his ambulation and activities and his wounds were subsequently dry and intact. He was discharged to home with instructions regarding his wound care and activities. He is to return to my office in three weeks for follow-up with x-rays to be done at the time of that visit. He is given a prescription for pain medication.  DISCHARGE DIAGNOSES: 1. Degenerative spondylolisthesis. 2. Synovial cyst. 3. Lumbar stenosis. 4. Lumbar radiculopathy. Dictated by:   Hosie Spangle, M.D. Attending Physician:  Donia Guiles DD:  11/21/01 TD:  11/24/01 Job: [id number]

## 2010-11-05 NOTE — Consult Note (Signed)
NAME:  Gregory Hatfield             ACCOUNT NO.:  [id number]   MEDICAL RECORD NO.:  [id number]          PATIENT TYPE:  INP   LOCATION:  5523                         FACILITY:  [place]   PHYSICIAN:  Sigmund I. Gaynelle Arabian, M.D.DATE OF BIRTH:  1926/05/21   DATE OF CONSULTATION:  06/11/2006  DATE OF DISCHARGE:                                 CONSULTATION   SUBJECTIVE:  Gregory Hatfield is a 77 year old African-American male, admitted to  the hospital on June 04, 2006, with 2 weeks of progressive shortness  of breath, and dyspnea on exertion.  The patient was found to have  significant bradycardia, as well as renal insufficiency, and a past  history of CVA.   He was last seen urologically on May 17, 2006, with a history of  BPH, and urinary incontinence.  His past urologic history is significant  for:   1. Unstable urinary bladder.  2. Diabetes.  3. Urinary incontinence.  4. Erectile dysfunction.  5. Urinary tract infection.   His past medical history is significant for:   1. Spinal surgery x3 in 1988, 1996, 2003.  2. Circumcision 1990.  3. Neck tumor removed in 1996.  4. Head tumors removed in 2006 and 2007.   PAST MEDICAL HISTORY:  1. Diabetes.  2. TIA.  3. Hypertension.  4. Gastroesophageal reflux disease.  5. Glaucoma.   Patient has a past history of creatinine of 1.1 in 2003, 1.2 in March of  2007, 1.3 in September 2007, and 3 on December 20.  Foley catheter was  placed, although the amount obtained is not recorded.  Creatinine fell  to 2.5 on December 22, rose to 2.7, and decreased to 2.5 again on  December 23.  The patient has recent urologic history of E. coli, placed  on Cipro, he was treated with Cipro 500 mg b.i.d., on May 17, 2006.  His latest PSA is 0.54 on May 17 2006.   His outpatient medicines have included in the past insulin, quinapril,  atenolol, verapamil, Vytorin, Lasix, AcipHex, as well as  anticholinergics Ditropan, and Detrol LA, as well as  [place].  The  patient has had urodynamic evaluations, which shows normal total bladder  capacity, but with unstable bladder.  He was seen in consultation, but  did not demonstrate urodynamic bladder outlet obstruction.   Tobacco is none.  Alcohol none.   OBJECTIVE:  Physical examination shows a well-developed, well-nourished  African-American male in no acute distress.  His temperature is 99.6, blood pressure 150/78, pulse 89, respiratory  rate 20, O2 sat 95%.  NECK:  Supple.  Nontender nodes.  CHEST:  Clear to P&A.  ABDOMEN:  Soft, good bowel sounds.  No organomegaly without masses.  GENITALIA:  Normal male external genitalia.  Urethra is normal.  Glans  is normal.  Rectal examination shows normal sphincter tone.  Prostate is 3+ lobular,  benign.  No masses, no blood.  EXTREMITIES:  No cyanosis or edema.  Psychologic shows normal oriented to time, person and place.  Skin is normal to inspection and palpation.   IMPRESSION:  Patient with rise in his creatinine thought possibly  secondary to  medications.  He does have some left hydronephrosis on  ultrasound, but has had recent infection, this may influence his  ultrasound.  In addition, I do not know how much urine was obtained by  Foley catheterization.  Would advise leaving Foley to see how low  creatinine will get.  We will plan to cystoscope the patient as an  outpatient, and possibly consider TUR, but I am afraid this may lead to  more pronounced urinary incontinence.      Sigmund I. Gaynelle Arabian, M.D.  Electronically Signed     SIT/MEDQ  D:  06/11/2006  T:  06/12/2006  Job:  [id number]

## 2010-11-05 NOTE — Consult Note (Signed)
NAME:  Gregory Hatfield, Gregory Hatfield NO.:  [id number]   MEDICAL RECORD NO.:  [id number]          PATIENT TYPE:  INP   LOCATION:  [id number]                         FACILITY:  [facility]   PHYSICIAN:  Donato Heinz, M.D.DATE OF BIRTH:  [birth year]   DATE OF CONSULTATION:  06/07/2006  DATE OF DISCHARGE:                                 CONSULTATION   CONSULTING PHYSICIAN:  [facility] Service.   REASON FOR CONSULTATION:  Acute renal failure.   HISTORY OF PRESENT ILLNESS:  Mr. Gregory Hatfield is a 77 year old, African-  American male with a past medical history significant for diabetes,  hypertension, mild pulmonary hypertension and strokes, who was admitted  on June 04, 2006 with a two-week history of increasing shortness of  breath, dyspnea on exertion as well as severe fatigue and malaise.  In  the emergency department, he was noted to have a heart rate of 40 and  was admitted for further workup and evaluation.  He was on multiple  negative chronotropic agents were felt to be the inciting event.  However, we were asked to see the patient because of a persistently  elevated creatinine of 2.7.  Most recent creatinine was approximately  three to four years ago and was 1.1.  He does not have any knowledge of  having kidney disease, but no outpatient labs are available for review.   ALLERGIES:  HE HAS ALLERGIES TO MORPHINE.  He does not know what his  reaction is.   PAST MEDICAL HISTORY:  1. Hypertension for 20 years.  2. Diabetes mellitus for 32 years, complicated by retinopathy.  3. Mild pulmonary hypertension, by echo.  4. History of stroke with left arm involvement.  5. Anemia.  6. Normal coronary arteries, by cath.  7. Erectile dysfunction.  8. Hyperlipidemia.  9. Degenerative joint disease, status post laminectomy.   MEDICATIONS:  Outpatient medications include:  1. Atenolol.  2. Lisinopril.  3. Lantus.  4. Baby aspirin.  5. Simvastatin.  6. Furosemide.  7.  Multivitamins.  8. Imipramine.   Inpatient medications:  1. Amlodipine 10 mg a day.  2. Lovenox subQ.  3. Insulin sliding scale.  4. Flomax 0.4 mg at bedtime.  5. Phenergan p.r.n.  6. Hydralazine 25 mg t.i.d.  7. Lantus 26 units at bedtime.  8. He also has multiple eye drops.   FAMILY HISTORY:  His father died at 46 from prostate cancer.  He also  had coronary artery disease.  Mother died at age 61 from complications  of diabetes and heart disease.  He has five sisters and four brothers.  All but three have passed away.  Family history of hypertension, heart  disease.   SOCIAL HISTORY:  He is a widow for the last three years.  He lives in  [place] by himself.  He is a retired Pharmacist, hospital.  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  He has five children alive and well.  He quit  tobacco in 1985 and no alcohol, no drug use.   REVIEW OF SYSTEMS:  GENERAL:  He feels well now, but he did present with  fatigue, shortness of  breath, dyspnea on exertion and lower extremity  edema on admission.  HEENT:  No headache, epistaxis, vision changes.  CARDIAC:  No chest pain, palpitations, orthopnea.  LUNGS:  No hemoptysis  or productive cough.  GI:  No nausea and vomiting, hematochezia, melena  or bright red blood per rectum.  GU:  No dysuria, polyuria, hematuria or  urgency, frequency or retention.  RHEUMATOLOGIC:  He does have chronic  back pain.  No swollen, painful joints.  DERMATOLOGIC:  No rashes, lumps  or bumps.  All other systems negative.   PHYSICAL EXAMINATION:  GENERAL:  He is a well-developed, obese man in no  acute distress.  VITAL SIGNS:  Temperature of 98.8.  Pulse of 67.  Respiratory rate 20.  Blood pressure of 130/77.  Weight is 99 kg.  HEENT EXAM:  Head normocephalic, atraumatic.  Pupils are equal, round  and reactive to light.  Extraocular movements are intact.  No icterus.  Oropharynx without lesions.  NECK:  Supple with full range of motion.  No lymphadenopathy or  bruits.  LUNGS:  Clear to auscultation and percussion bilaterally.  No rales or  rhonchi.  CARDIAC EXAM:  Regular rate and rhythm at 67.  No precordium  appreciated.  ABDOMINAL EXAM:  Mildly distended.  Normoactive bowel sounds, soft,  nontender.  No guarding, no rebound, no bruits.  EXTREMITIES:  He had trace pedal edema bilaterally.  NEUROLOGIC EXAM:  He is awake, alert and oriented times three.  Cranial  nerves II through XII are grossly intact.  No asterixis.   LABORATORY DATA:  White blood cell count was 11, hemoglobin 11.9,  platelets 281,000.  Sodium 138, potassium 5, chloride 103, CO2 of 26,  BUN 40, creatinine 2.6, glucose 162, calcium 9.1.  Urinalysis was  negative for blood and protein.  Ultrasound showed mild left hydro.  Right kidney was 11.9 cm, left kidney 13.4 cm.   ASSESSMENT/PLAN:  1. Elevated creatinine.  It is unclear if this is acute or chronic as      I do not have any labs available since 2003, at which time it was      1.1.  We will try to obtain outpatient records from his primary      care Ameshia Pewitt.  The ultrasound noted some mild hydro.  Doubt this      would be significant enough to account for elevated creatinine.      This could possibly be ischemic ATN since he was on an ACE      inhibitor as an outpatient and came into the hospital with      bradycardia, hypotension.  He denies any nonsteroidals, COX-2      inhibitors.  We will continue following renal function off the ACE      inhibitor.  Check an SPEP and UPEP.  Continue to follow.  2. Hyperkalemia, as above.  We will start him on Lasix and follow.  3. Hypertension.  I will start him on hydralazine on a regular basis      rather than p.r.n. and add Lasix 40 mg a day and continue to      follow.  4. Anemia.  Obtain a follow-up CBC and iron studies.  5. Bradycardia.  This is improved off of the beta blocker and      diltiazem.  Plan as per cardiology. 6. Gastroesophageal reflux disease, stable.   Continue with proton pump      inhibitor.  7. Urinary retention, on Flomax.  8. Left  hydronephrosis.  He is followed by Dr. Gaynelle Arabian.   We will continue to follow along.  Thank you for this consultation.           ______________________________  Donato Heinz, M.D.     JC/MEDQ  D:  06/08/2006  T:  06/09/2006  Job:  [id number]

## 2010-11-05 NOTE — H&P (Signed)
NAME:  Gregory Hatfield, Gregory Hatfield NO.:  [id number]   MEDICAL RECORD NO.:  [id number]          PATIENT TYPE:  EMS   LOCATION:  MAJO                         FACILITY:  [facility]   PHYSICIAN:  Thayer Headings, MD    DATE OF BIRTH:  [birth year]   DATE OF ADMISSION:  06/04/2006  DATE OF DISCHARGE:                              HISTORY & PHYSICAL   PRIMARY CARE PHYSICIAN:  Dr. Melford Aase.   CHIEF COMPLAINT:  Dyspnea.   HISTORY OF PRESENT ILLNESS:  This is a 77 year old male with a history  of a CVA affecting his left eye and reported cardiac cath, who presents  with about a 2 weeks of progressive shortness of breath with exertion.  Patient reports that he has never had this before and he has gotten to  the point where he can barely walk across the room without having to sit  down and catch his breath.  The patient reports when he sits down he  then can catch his breath.  He, otherwise, reports no other symptoms,  including no fever, no orthopnea, no chest pain, no nausea, vomiting.  The patient also reports no chills, diaphoresis or other significant  problems.  The patient also has a history of diabetes and hypertension,  which he takes insulin and atenolol and lisinopril respectively.  Patient reports no known history of kidney problems.  He does have  urinary problems, for which he sees a urologist.   PAST MEDICAL HISTORY:  1. Hypertension.  2. Diabetes.  3. History of CVA affecting left eye.   MEDICATIONS:  Patient is on:  1. Humalog subcu, 17 units in the a.m., 13 units at lunch, and 23      units at dinner.  2. Lantus 26 units at bedtime.  3. Baby aspirin.  4. Simvastatin dose unknown.  5. Furosemide dose unknown.  6. Atenolol dose unknown.  7. Imipramine dose unknown.  8. Multivitamin.  9. Eye drops.  10.Pharmacy is [facility] in [place], number is [phone].  11.Patient also reports getting some medicines through the [facility] in      [place].   PAST SURGICAL  HISTORY:  Patient has had a laminectomy and spinal fusion  x2.   SOCIAL HISTORY:  Patient has a remote smoking history, occasional  alcohol and denies any drug use.   FAMILY HISTORY:  No known history of heart disease or cancer.   REVIEW OF SYSTEMS:  A complete 12-point review of systems was obtained  was negative and other than that is reported in the history of present  illness.   PHYSICAL EXAMINATION:  VITAL SIGNS:  Temperature is 97.6, pulse 40,  respirations 20, blood pressure is 113/47.  GENERAL:  The patient is alert and oriented x3 and appears in no acute  distress.  HEENT:  Anicteric.  No thrush.  No  lymphadenopathy.  LUNGS:  Clear to auscultation bilaterally.  HEART:  Bradycardic, regular rhythm with no murmurs, rubs or gallops.  ABDOMEN:  Obese, distended at patient's baseline.  Nontender with  positive bowel sounds and no hepatosplenomegaly appreciated.  GU:  No masses.  EXTREMITIES:  No cyanosis, clubbing or edema.  SKIN:  No rashes.   LABS:  Labs were reviewed, significant for a creatinine of 3.0, WBC is  10.0 with a hemoglobin of 11.5.  CK-MB is 5.1 with a troponin-I of less  than 0.05.  Glucose is 203, BUN is 63 and creatinine is 3.0.  Sodium is  132, potassium 4.5, chloride 104.   EKG:  Sinus bradycardia.   ASSESSMENT:  This is a 77 year old male with bradycardia and renal  failure of unknown duration with no previous lab studies to compare.   PLAN:  1. Bradycardia.  The patient is likely bradycardiac secondary to his      over medication with a beta blocker, atenolol.  We will hold the      patient's medicine and place the patient on a monitor and observe.      Also, we will check his cardiac enzymes secondary to his atypical      complaints with dyspnea on exertion, which could be consistent with      an angina.  However, this is unlikely as he has had not had any      chest pain or any diaphoresis.  We will also recheck an EKG and      monitor for any  arrhythmias.  Unclear, at this time, if there are      any EKG changes.  None apparent.  2. Renal insufficiency.  We will hydrate the patient and recheck his      creatinine in the a.m., as well as checking a FENA and discuss with      his primary physician if this is new or old.  Could be secondary to      his diabetes and/or hypertension.  We will hold his ACE inhibitor      for now pending review of his kidneys.  3. History of cerebrovascular accident.  We will continue with his      aspirin.  4. Disposition.  Patient is a full code.  We will call the pharmacy      and clarify his medicines.      Thayer Headings, MD  Electronically Signed     RWC/MEDQ  D:  06/04/2006  T:  06/05/2006  Job:  [id number]

## 2010-11-05 NOTE — H&P (Signed)
[facility]  Patient:    Gregory Hatfield, Gregory Hatfield Visit Number: [id number] MRN: [id number]          Service Type: SUR Location: [id number] Attending Physician:  Donia Guiles Dictated by:   Hosie Spangle, M.D. Admit Date:  10/08/2001 Discharge Date: 10/12/2001                           History and Physical  OFFICE MEDICAL RECORD NUMBER [id number]  HISTORY OF PRESENT ILLNESS:  The patient is a 77 year old, right-handed black male who is status post an L3 through L5 lumbar laminectomy in December 1995. He did well from that surgery, but returned in January of this year because of pain that he is experiencing in his low back, radiating down to his left buttock, typically bothering him first in the morning, and less so through the rest of the day.  He had some tingling in the lateral aspect of his left leg and denied any numbness or weakness.  The patient had been evaluated with an MRI scan of the lumbar spine, which showed a grade 1 degenerative spondylolisthesis of L3 on L4, and a grade 1 degenerative spondylolisthesis of L4 on L5.  There is some lateral recess encroachment at L4-5 with what was felt to be most likely by now bilateral synovial cysts.  The previous laminectomy from the inferior aspect of L3 to the superior aspect of L5 was noted.  To further evaluate this, the patient underwent an outpatient lumbar myelogram and post-myelogram CT scan.  Noted was a mild grade 1 L3 on L4 static spondylolisthesis and a moderate grade 1 dynamic L4-5 spondylolisthesis, which increases with flexion and decreases with extension.  Further noted were the bilateral synovial cysts at L4-5 with stenosis of the canal due to the cyst formation, and facet arthropathy.  The patient is admitted now for L3, L4, and L5 laminectomy, resection of the synovial cysts, and L4-5 posterior lumbar interbody fusion with bone graft, and a possible L3-4 posterior  lumbar interbody fusion, and with an L3 to L5 posterior lateral arthrodesis with bone graft and posterior instrumentation.  PAST MEDICAL HISTORY  1. Notable for a history of hypertension, treated for over 25 years.  2. Diabetes mellitus, treated for over 25 years.  3. Hiatal hernia.  4. Acid reflux.  He denies any history of a myocardial infarction, cancer, stroke, or lung disease.  PAST SURGICAL HISTORY  1. His lumbar laminectomy in December 1995.  2. Circumcision.  3. Removal of lesions from around his neck.  ALLERGIES:  Denied.  CURRENT MEDICATIONS  1. Glucophage 100 mg b.i.d.  2. Glyburide 10 mg b.i.d.  3. Lasix 40 mg q.a.m.  4. Detrol 2 mg b.i.d.  5. Aciphex 20 mg q.a.m.  6. Celebrex 200 mg b.i.d.  7. Zocor 40 mg q.d.  8. Accupril 40 mg q.d.  9. Vicodin p.r.n. 10. Flexeril p.r.n. 11. Hytrin 10 mg q.h.s.  FAMILY HISTORY:  His parents are both passed on.  There is a family history of diabetes mellitus, heart disease, and bronchitis.  SOCIAL HISTORY:  The patient was recently widowed.  His wife had also been a patient of mine.  He is retired.  He does not smoke, drink alcoholic beverages, or have a history of substance abuse.  REVIEW OF SYSTEMS:  Notable for that as described in the history of present illness and past medical history.  It is otherwise unremarkable.  PHYSICAL EXAMINATION  GENERAL:  The patient is a well-developed, well-nourished black male, in no acute distress.  Height 6 feet 1 inch, weight 198 pounds.  VITAL SIGNS:  He is afebrile.  LUNGS:  Clear to auscultation.  He has symmetrical respiratory excursions.  HEART:  A regular rate and rhythm.  Normal S1, S2.  There is a 2/6 systolic murmur.  ABDOMEN:  Soft, nondistended. Bowel sounds are present.  EXTREMITIES:  No clubbing, cyanosis, or edema.  MUSCULOSKELETAL:  No tenderness to palpation over the lumbar spinous process. He is able to flex 90 degrees, able to extend fairly well.  Straight  leg raising is negative bilaterally.  NEUROLOGIC:  Shows 5/5 strength of the lower extremities, including the iliopsoas, quadriceps, dorsiflexor, plantar flexor, and extensor hallucis longus bilaterally.  Sensation intact to pinprick to the lower extremities. Reflexes are minimal at the quadriceps and the gastrocnemius.  They are symmetrical bilaterally.  Toes are downgoing bilaterally.  He has a normal gait and stance.  DIAGNOSTIC STUDIES:  MRI scan as well as myelogram and post-myelogram CT scan were obtained prior to admission, and are described above in the history of present illness.  IMPRESSION:  Left lumbar radiculopathy, secondary to neural compression, secondary to synovial cyst formation, spondylolisthesis, facet arthropathy, and hypertrophy, consistent with spondylosis, and lumbar stenosis.  PLAN:  The patient will be admitted for an L3, L4, and L5 lumbar laminectomy, resection of bilateral synovial cysts at L4-5, and L4-5 posterior lumbar interbody fusion with bone graft and possible L3-4 posterior lumbar interbody fusion, with bone graft, and an L3 to L5 posterolateral arthrodesis with posterior instrumentation and bone graft.   We had an opportunity to discuss on several occasions the nature of this surgery, typical length of surgery, hospital stay, and overall recuperation, his limitations postoperatively, and the risks of surgery including risks of infection, bleeding, possible need for transfusion, the risks of nerve dysfunction, pain, weakness, numbness, or paresthesias, the risks of a dural tear, CSF leakage, and possible need for further surgery, the risks of failure of the arthrodesis, and possible need for further surgery, and the anesthetic risks of myocardial infarction, stroke, pneumonia, and death.  He also understands that he will need to be immobilized in a lumbar corset postoperatively.  Understanding all of this, he does wish to proceed with surgery,  and is admitted for such. Dictated by:   Hosie Spangle, M.D.  Attending Physician:  Donia Guiles DD:  10/12/01 TD:  10/13/01 Job: [id number]

## 2010-11-05 NOTE — Consult Note (Signed)
[facility]                            ENDOCRINOLOGY CONSULTATION   CLETO, COOMBE                    MRN:          [id number]  DATE:03/08/2006                            DOB:          1926-05-07    REASON FOR VISIT:  Followup diabetes.   HISTORY OF PRESENT ILLNESS:  A 77 year old man who forgot his home glucose  record, but he states his glucoses are both highest and lowest in the  afternoon.   SOCIAL HISTORY:  He was widowed in 2003.  He lives alone.   REVIEW OF SYSTEMS:  He denies any memory loss.  He states he has  hypoglycemia about twice a week, before lunch or in the afternoon.   PHYSICAL EXAMINATION:  Blood pressure 121/62, heart rate 58, temperature  97.7, weight is 215.  GENERAL:  No distress.  FEET:  Normal color and temperature.  He has bilateral onychomycosis.  Dorsalis pedis pulses are intact bilaterally and sensation is intact to  touch.   LABORATORY STUDIES:  On March 08, 2006, hemoglobin A1C 7.4.   IMPRESSION:  Improvement in his glucose control, although his control is  limited by lack of available home glucose information.   PLAN:  1. Same insulin for now.  2. Return in 55 says with your home glucose record.                                   Sean A. Loanne Drilling, MD   [place]  DD:  03/10/2006  DT:  03/13/2006  Job #:  [id number]   cc:   [name], M.D.

## 2010-11-05 NOTE — Consult Note (Signed)
[facility]                          ENDOCRINOLOGY CONSULTATION   XSAVIOR, AUPPERLE                    MRN:          [id number]  DATE:07/07/2006                            DOB:          1926-01-20    REASON FOR VISIT:  Follow up diabetes.   HISTORY OF PRESENT ILLNESS:  A 77 year old man who was recently  hospitalized and is now home being treated by a home health nurse with  47 units of Lantus a day and a sliding scale of Humalog.  He describes  his glucose as extremely variable and is having hypoglycemia in the  middle of the night.   PAST MEDICAL HISTORY:  1. Hypertension.  2. Dyslipidemia.  3. Prostatism.  4. Glaucoma.  5. GERD.  6. CHF.  7. CVA.  8. Diabetic retinopathy.   REVIEW OF SYSTEMS:  Denies syncope.  Denies any change in his weight.   PHYSICAL EXAMINATION:  Blood pressure 156/71, heart rate 88, temperature  is 98.6.  The weight is 214.  GENERAL:  No distress.  He is alert and oriented.  Gait is normal.  He  is steady on his feet.   IMPRESSION:  The pattern of his home glucose indicates that he needs to  go back on a fixed insulin dose.   PLAN:  1. Change to Lantus 26 units a day and Humalog 17 breakfast, 13 lunch,      23 at supper.  2. Return within 30 days or sooner if necessary.     Sean A. Loanne Drilling, MD  Electronically Signed    SAE/MedQ  DD: 07/09/2006  DT: 07/09/2006  Job #: [id number]   cc:   [name], M.D.

## 2010-11-05 NOTE — Op Note (Signed)
NAME:  Gregory Hatfield, Gregory Hatfield             ACCOUNT NO.:  [id number]   MEDICAL RECORD NO.:  [id number]          PATIENT TYPE:  AMB   LOCATION:  ENDO                         FACILITY:  [facility]   PHYSICIAN:  John C. Amedeo Plenty, M.D.    DATE OF BIRTH:  [birth year]   DATE OF PROCEDURE:  02/03/2005  DATE OF DISCHARGE:                                 OPERATIVE REPORT   PROCEDURE:  Colonoscopy.   ENDOSCOPIST:  Elyse Jarvis. Amedeo Plenty, M.D.   INDICATIONS FOR PROCEDURE:  Average risk colon cancer screening.   DESCRIPTION OF PROCEDURE:  The patient was placed in the left lateral  decubitus position and placed on the pulse monitor with continuous low-flow  oxygen delivered by nasal cannula.  He was sedated with 50 mcg IV fentanyl  and 2 mg of IV Versed.  The Olympus video colonoscope was inserted into the  rectum and advanced to cecum, confirmed by transillumination of McBurney's  point visualization of ileocecal valve and appendiceal orifice.  The prep  was excellent.  The cecum, ascending, transverse, descending and sigmoid  colon all appeared normal with no masses, polyps, diverticula or other  mucosal abnormalities.  The rectum likewise appeared normal on retroflexed  view.  The anus revealed no obvious internal hemorrhoids.  The scope was  then withdrawn and the patient returned to the recovery room in stable  condition.  He tolerated the procedure well, and there were no immediate  complications.   IMPRESSION:  Normal study.   PLAN:  Next colonoscopy within 10 years and consider sigmoidoscopy or  Hemoccults in 5 years.           ______________________________  Elyse Jarvis Amedeo Plenty, M.D.     JCH/MEDQ  D:  02/03/2005  T:  02/03/2005  Job:  [id number]   cc:   [name], M.D.  [address], [place]  [place],  [address]  Fax: [phone]

## 2010-11-05 NOTE — Op Note (Signed)
[place]. [facility]  Patient:    Gregory, Hatfield Visit Number: [id number] MRN: [id number]          Service Type: SUR Location: [id number] Attending Physician:  Donia Guiles Dictated by:   Hosie Spangle, M.D. Proc. Date: 10/08/01 Admit Date:  10/08/2001                             Operative Report  PREOPERATIVE DIAGNOSES: 1. L4-5 degenerative dynamic spondylolisthesis. 2. L3-4 degenerative static spondylolisthesis. 3. Lumbar spondylosis. 4. Lumbar degenerative disk disease. 5. Bilateral L4-5 lumbar synovial cysts. 6. Left lumbar radiculopathy.  POSTOPERATIVE DIAGNOSES: 1. L4-5 degenerative dynamic spondylolisthesis. 2. L3-4 degenerative static spondylolisthesis. 3. Lumbar spondylosis. 4. Lumbar degenerative disk disease. 5. Bilateral L4-5 lumbar synovial cysts. 6. Left lumbar radiculopathy.  PROCEDURES: 1. L3, L4, and L5 lumbar laminectomy. 2. Resection of bilateral L4-5 synovial cysts. 3. Bilateral L4-5 microdiskectomy. 4. Posterior lumbar interbody fusion with femoral shaft allograft, Vitoss    with bone marrow aspirate. 5. L3 to L5 posterolateral arthrodesis with 90D posterior instrumentation,    Vitoss, and bone marrow aspirate.  SURGEON:  Hosie Spangle, M.D.  ASSISTANT:  Earleen Newport, M.D.  ANESTHESIA:  General endotracheal.  INDICATION:  The patient is a 77 year old man who presented with left lumbar radiculopathy.  He is status post a previous L3 through L5 lumbar laminectomy in December 1995, from which he recovered well.  He was evaluated with MRI scan and myelogram and post-myelogram CT scan.  A decision was made to proceed with decompression and arthrodesis.  DESCRIPTION OF PROCEDURE:  The patient was brought to the operating room, placed under general endotracheal anesthesia.  The patient was turned to a prone position.  The lumbar region was prepped with Betadine soap and solution and draped in a  sterile fashion.  The previous midline incision was infiltrated with local anesthetic with epinephrine and then the midline incision was opened and dissection was carried down through the subcutaneous tissue with bipolar cautery, electrocautery used to maintain hemostasis. Dissection was carried down to the lumbar fascia, which was incised.  We identified the spinous processes of L2 and S1.  We were able to expose the laminae of L2 and S1 and the superior aspect of the lamina of L3 and the inferior aspect of the lamina of L5.  Careful dissection was then carried out through the midline scar tissue, and then dissection was carried out laterally to the facet complexes bilaterally.  Self-retaining retractors were placed. We then dissected in the far lateral region, exposing the transverse processes of L3, L4, and L5 bilaterally.  The surfaces of these were decorticated.  The scar tissues edge along the previous laminectomy defect was then carefully dissected, enabled to further mobilize the scar tissue and expose the thecal sac.  We identified the L4 and L5 nerve roots bilaterally.  We found bilateral synovial cysts at the L4-5 level, and these were carefully resected to avoid any injury to the dural sac, and then the microscope was draped and brought into the field to provide additional magnification, illumination, and visualization.  The remainder of the procedure was performed with the assistance of microdissection and microsurgical technique.  The L4-5 disk space was identified, diskectomies performed bilaterally with incision of the annulus.  We continued with a variety of curettes and pituitary rongeurs.  A thorough diskectomy was performed, and the cartilaginous end plates of the corresponding vertebrae  were carefully removed.  We then used a 10 mm distraction spreader and using the Bone Craft system and an oscillating saw, a freeze-dried femoral shaft was cut to a height of 10 mm,  scoring was done on its surface with the oscillating saw, and then the graft was cut in half and carefully retracting the thecal sac, the grafts were placed bilaterally in the L4-5 interbody space.  We later took Vitoss which had been soaked in bone marrow aspirate and packed it around the graft in the interbody space.  At the L3-4 level the laminectomy was extended further on the left side to more fully decompress the thecal sac and nerve roots and in particular to decompress the lateral recess.  We then proceeded with the posterolateral arthrodesis.  We identified the pedicle entry sites bilaterally at L3, L4, and L5, and using pedicle probe, the pedicles were identified.  They were then tapped with a 5.25 tap, bone marrow aspirate was collected, and the Vitoss was soaked in the bone marrow aspirate for later implantation.  We then packed the Vitoss in the lateral gutter over the transverse processes of L3, L4, and L5.  We then placed 6.75 x 45 mm screws at each level.  X-rays were used throughout the posterior arthrodesis to confirm the trajectories of the pedicle probes and the screws, and all the screws were in good position.  We then selected a 70 mm rod that was contoured to the lumbar lordosis and then locking caps were placed and after all three were placed, they were securely tightened and final tightening was done.  The wound was irrigated throughout the procedure with bacitracin solution.  Hemostasis was achieved prior to closure, and then once the decompression and arthrodesis had been completed, we proceeded with closure of the paraspinal muscles, approximated with interrupted, undyed 1 Vicryl suture, the deep fascia with interrupted, undyed 1 Vicryl sutures, the subcutaneous and subcuticular layer were closed with interrupted, inverted 2-0 undyed Vicryl sutures, and the skin edges were closed with Dermabond.  The patient tolerated the procedure well.  The estimated blood loss  was 1200 cc. We were able to return to the patient 450 cc of Cell Saver blood.  Sponge and needle count were correct.  Following surgery, the patient was turned back to  a supine position and reversed from his anesthetic, extubated, and transferred to the recovery room for further care, where he was noted to be moving all four extremities. Dictated by:   Hosie Spangle, M.D. Attending Physician:  Donia Guiles DD:  10/08/01 TD:  10/09/01 Job: [id number]

## 2010-11-05 NOTE — Cardiovascular Report (Signed)
[place]. [facility]  Patient:    Gregory Hatfield, Gregory Hatfield                    MRN: [id number] Proc. Date: 09/28/99 Adm. Date:  [id number] Disc. Date: [id number] Attending:  Lawana Pai CC:         Kirtland Bouchard, M.D.             Jeanella Craze. Little, M.D.                        Cardiac Catheterization  INDICATIONS FOR PROCEDURE:  [name] is a 77 year old male who has diabetes. He underwent Persantine Cardiolite study and had an ejection fraction of 55% but anterior and inferior ischemia.  Because of this he is brought to the catheterization lab as an outpatient for cardiac catheterization.  PROCEDURES: 1. Left heart catheterization. 2. Selective right and left coronary arteriography. 3. Ventriculography in the RAO projection.  DESCRIPTION OF PROCEDURE:  The patient was prepared and draped in the usual sterile fashion after obtaining informed consent.  Following local anesthetic with 1% Xylocaine the Seldinger technique was employed, and a 6 [place] introducer sheath was placed into the right femoral artery.  Selective right and left coronary arteriography and ventriculography in the RAO projection were performed.  The 6  French Judkins configuration catheters were used.  RESULTS: 1. Hemodynamic monitoring:  Central aortic pressure 173/84, left    ventricular pressure 162/27.  There was a reverse gradient noted,    which is insignificant. 2. Ventriculography:  Ventriculography in the RAO projection revealed    the anterior wall and the apex to be basically normal.  The inferior wall    was severely hypokinetic.  Mitral valve prolapse with a whiff of mitral    regurgitation was noted.  The left ventricular end-diastolic pressure was    slightly elevated at 23.  The ejection fraction was 65%.  During the case    the patient was bradycardic with a rate of 42-44.  CORONARY ARTERIOGRAPHY:  No calcification was noted on fluoroscopy. 1. Left main:   Normal. 2. LAD.  The LAD had minor irregularities.  The mid and distal portion of the    LAD was a relatively small vessel and extended down towards the apex of the    heart.  There were two very small diagonal branches seen. 3. Circumflex:  The circumflex had some proximal mid irregularities.  There    were two OM vessels noted.  These were free of disease. 4. Right coronary artery:  The right coronary artery was a small vessel giving    rise to a relatively long right ventricular branch.  No posterior descending    artery was visualized from the RCA nor was one seen from the circumflex.  CONCLUSIONS: 1. Normal ejection fraction with inferior wall motion abnormalities. 2. Mitral valve prolapse with whiff of mitral regurgitation. 3. Mild small vessel disease.  DISCUSSION:  At this point, there is nothing angiographic to explain the abnormal nuclear study.  The inferior wall motion abnormality could be related to a occlusion of a PDA.  However, I cannot even visualize a nub and I am not sure that there is any significant CAD.  Clearly there is nothing present at this point to warrant intervention.  Would maintain the patient on beta blockers and ACE inhibitors and cholesterol lowering medications, all of which he is currently taking. DD:  09/28/99 TD:  09/29/99 Job: [id number]

## 2010-11-05 NOTE — Consult Note (Signed)
[facility]                          ENDOCRINOLOGY CONSULTATION   AEDYN, PENZA                    MRN:          [id number]  DATE:10/03/2006                            DOB:          Feb 12, 1926    REASON FOR VISIT:  Followup for diabetes.   HISTORY OF PRESENT ILLNESS:  This is a 77 year old man who states that  his diabetes is well-controlled.  He states he is having trouble  remembering to take his Humalog only before eating.   PAST MEDICAL HISTORY:  1. Diabetes retinopathy.  2. CVA effecting the left eye.  3. CHF.  4. GERD.  5. Glaucoma.  6. BPH.  7. Dyslipidemia.  8. Hypertension.   REVIEW OF SYSTEMS:  Hypoglycemia once after he took his Humalog, but did  not eat.   PHYSICAL EXAMINATION:  VITAL SIGNS:  Blood pressure 137/64, heart rate  61, temperature 97.7, weight 212.  GENERAL:  In no distress.  He is alert and well-oriented.  Gait is  observed in the office to be normal.   LABORATORY DATA AND X-RAY FINDINGS:  He states his hemoglobin A1c last  week was 7.5.   IMPRESSION:  He appears to be having trouble keeping up with this  current schedule of insulin injected four times a day.   PLAN:  1. I discussed the above with him.  I gave him the option of      continuing his current regimen or changing to twice daily premixed      insulin.  He states he wants to change to the twice a day premix,      so I have prescribed him 75/25 insulin 45 units with breakfast and      25 units with      lunch.  I told him he could use up his current insulin.  2. Return in about 6 weeks.     Sean A. Loanne Drilling, MD  Electronically Signed    SAE/MedQ  DD: 10/05/2006  DT: 10/05/2006  Job #: [id number]   cc:   [name], M.D.  Jeanella Craze. Little, M.D.

## 2010-12-30 NOTE — Op Note (Signed)
NAME:  Gregory Hatfield, MASK             ACCOUNT NO.:  [id number]  MEDICAL RECORD NO.:  [id number]           PATIENT TYPE:  O  LOCATION:  [facility]                         FACILITY:  [place]  PHYSICIAN:  Marylynn Pearson, M.D.     DATE OF BIRTH:  [birth year]  DATE OF PROCEDURE:  10/13/2010 DATE OF DISCHARGE:  10/13/2010                              OPERATIVE REPORT   PREOPERATIVE DIAGNOSES: 1. Visually significant cataract and proliferative diabetic     retinopathy, right eye. 2. Glaucoma, right eye.  The glaucoma is currently stable.  POSTOPERATIVE DIAGNOSES: 1. Visually significant cataract and proliferative diabetic     retinopathy, right eye. 2. Glaucoma, right eye.  The glaucoma is currently stable.  PROCEDURES: 1. Phacoemulsification. 2. Intraocular lens implant.  COMPLICATIONS:  None.  ANESTHESIA:  Consisted of 2% Xylocaine with epinephrine and a 50/50 mixture with 0.75% Marcaine with ampule of Wydase.  DESCRIPTION OF PROCEDURE:  The patient was taken to the operative room where he was given a peribulbar block with the aforementioned local anesthetic agent.  Following this, the patient's face was prepped and draped in the usual sterile fashion with the surgeon sitting temporally in the operating microscope position.  A Weck-cel sponge was used to fixate the globe and a 15-degree blade was used to enter the eye at the 11 o'clock position and Viscoat was injected.  Following this using another Weck-cel sponge to fixate the globe, a 2.75-mm keratome blade was used in a stepwise fashion through temporal clear cornea to enter the eye.  Following this, a bent 25-gauge needle was used to incise the anterior capsule on a continuous tear.  A 5.5-mm capsulorrhexis was formed.  BSS was used to hydrodissect and hydrodelineate the nucleus and the nucleus was loosened within the capsule bag with the cannula. Following this, an additional Viscoat was injected and the phacoemulsification unit was  then used to remove the epinucleus and central trough was sculpted.  The nucleus was rotated 4 times and 4 troughs were sculpted and a total nuclear fragments were removed from the eye.  The remaining nuclear fragment was removed as half section using the Kuglen hook to elevate.  Following this, the IA was used to strip cortical fibers from the posterior capsule after all cortical fibers had been removed.  A capsule remained intact.  Provisc was injected in the eye and the intraocular lens implant was examined.  It was noted to have no defects.  It was an alkaline Aqueous soft SN60 WF 23.0 diopter lens, SN [id number].  The lens was placed in the lens injector and then injected, unfolded in the bag using the Kuglen hook. The lens was positioned after the IA was used to remove subincisional cortex and viscoelastic was removed from the eye.  The eye was pressurized.  Miostat was injected.  The pupil was seemed to come down round.  The lens was centered.  The incision revealed no leakage.  Therefore, the lid speculum was removed.  Topical TobraDex ointment was applied to the eye.  A patch of Fox shield were placed and the patient returned to recovery area in stable condition.  Marylynn Pearson, M.D.     RW/MEDQ  D:  10/13/2010  T:  10/14/2010  Job:  [id number]  cc:   [id number]  Electronically Signed by Marylynn Pearson M.D. on 12/30/2010 06:41:21 PM

## 2011-01-03 ENCOUNTER — Ambulatory Visit: Payer: Medicare Other | Attending: Family Medicine

## 2011-01-03 DIAGNOSIS — IMO0001 Reserved for inherently not codable concepts without codable children: Secondary | ICD-10-CM | POA: Insufficient documentation

## 2011-01-03 DIAGNOSIS — M25569 Pain in unspecified knee: Secondary | ICD-10-CM | POA: Insufficient documentation

## 2011-01-03 DIAGNOSIS — M25559 Pain in unspecified hip: Secondary | ICD-10-CM | POA: Insufficient documentation

## 2011-01-06 ENCOUNTER — Other Ambulatory Visit (INDEPENDENT_AMBULATORY_CARE_PROVIDER_SITE_OTHER): Payer: Medicare Other

## 2011-01-06 ENCOUNTER — Encounter: Payer: Self-pay | Admitting: Endocrinology

## 2011-01-06 ENCOUNTER — Ambulatory Visit (INDEPENDENT_AMBULATORY_CARE_PROVIDER_SITE_OTHER): Payer: Medicare Other | Admitting: Endocrinology

## 2011-01-06 VITALS — BP 122/60 | HR 56 | Temp 98.6°F | Ht 72.0 in | Wt 198.6 lb

## 2011-01-06 DIAGNOSIS — E119 Type 2 diabetes mellitus without complications: Secondary | ICD-10-CM

## 2011-01-06 MED ORDER — INSULIN GLARGINE 100 UNIT/ML ~~LOC~~ SOLN: 70.0000 [IU] | SUBCUTANEOUS | Status: DC

## 2011-01-06 NOTE — Patient Instructions (Addendum)
blood tests are being ordered for you today.  please call [phone] to hear your test results.  You will be prompted to enter the 9-digit "MRN" number that appears at the top left of this page, followed by #.  Then you will hear the message. Please change the levemir to lantus, 70 units each morning.  Please make a follow-up appointment in 2-3 weeks. check your blood sugar 2 times a day.  vary the time of day when you check, between before the 3 meals, and at bedtime.  also check if you have symptoms of your blood sugar being too high or too low.  please keep a record of the readings and bring it to your next appointment here.  please call us sooner if you are having low blood sugar episodes, or if it stays over 200.

## 2011-01-06 NOTE — Progress Notes (Signed)
Subjective:    Patient ID: Gregory Hatfield, male    DOB: 07-Nov-1925, 77 y.o.   MRN: [id number]  HPI Pt says he is feeling better since recent cataract, orthopedic, and prostate procedures.  he brings a record of his cbg's which i have reviewed today.  cbg's have increased since steroid injection, into the left hip, 2 weeks ago.  he brings a record of his cbg's which i have reviewed today.  Prior to the injection, cbg's were 100-300, with no trend throughout the day.   Past Medical History  Diagnosis Date  . Diabetes mellitus   . CHF (congestive heart failure)   . GERD (gastroesophageal reflux disease)   . Hypertension   . Dyslipidemia   . Glaucoma   . CVA (cerebral infarction)     Left Eye  . Diabetic retinopathy     No past surgical history on file.  History   Social History  . Marital Status: Widowed    Spouse Name: N/A    Number of Children: N/A  . Years of Education: N/A   Occupational History  . Not on file.   Social History Main Topics  . Smoking status: Former Research scientist (life sciences)  . Smokeless tobacco: Not on file  . Alcohol Use: Not on file  . Drug Use: Not on file  . Sexually Active: Not on file   Other Topics Concern  . Not on file   Social History Narrative  . No narrative on file    Current Outpatient Prescriptions on File Prior to Visit  Medication Sig Dispense Refill  . acetaminophen (TYLENOL ARTHRITIS PAIN) 650 MG CR tablet 2 tablets every 4 hours as needed       . alfuzosin (UROXATRAL) 10 MG 24 hr tablet Take 10 mg by mouth daily.       Marland Kitchen aspirin 81 MG EC tablet 1/2 tablet daily      . Bimatoprost (LUMIGAN) 0.01 % SOLN One drop in the right eye at bedtime      . brimonidine (ALPHAGAN P) 0.1 % SOLN Place 1 drop into both eyes 2 (two) times daily.       . carvedilol (COREG) 12.5 MG tablet Take 12.5 mg by mouth 2 (two) times daily with a meal.       . Cholecalciferol (VITAMIN D3) 5000 UNITS CAPS Take 1 capsule by mouth 2 (two) times daily.        . dorzolamide  (TRUSOPT) 2 % ophthalmic solution Place 1 drop into the right eye 2 (two) times daily.        . furosemide (LASIX) 40 MG tablet Take 40 mg by mouth daily.       Marland Kitchen glucose blood (FREESTYLE TEST STRIPS) test strip 1 each by Other route 4 (four) times daily. Use as instructed       . hydrALAZINE (APRESOLINE) 25 MG tablet Take 12.5 mg by mouth 2 (two) times daily.        . Insulin Pen Needle (B-D UF III MINI PEN NEEDLES) 31G X 5 MM MISC 1 each by Does not apply route 4 (four) times daily.        Marland Kitchen latanoprost (XALATAN) 0.005 % ophthalmic solution Place 1 drop into the right eye 2 (two) times daily.        Marland Kitchen lisinopril (PRINIVIL,ZESTRIL) 20 MG tablet Take 40 mg by mouth daily.       . Multiple Vitamin (MULTIVITAMIN) tablet Take 1 tablet by mouth daily.        Marland Kitchen  oxybutynin (DITROPAN-XL) 5 MG 24 hr tablet Take 5 mg by mouth daily.        . Polyethylene Glycol 3350 (MIRALAX PO) Take by mouth as needed.        . pravastatin (PRAVACHOL) 40 MG tablet Take 40 mg by mouth daily.        . prednisoLONE acetate (PRED FORTE) 1 % ophthalmic suspension Place 1 drop into the left eye daily.        . Probiotic Product (ALIGN) 4 MG CAPS Take 1 capsule by mouth daily.        . psyllium (METAMUCIL) 58.6 % packet Take 1 packet by mouth 2 (two) times daily.        . simvastatin (ZOCOR) 20 MG tablet Take 20 mg by mouth at bedtime.        . terbinafine (LAMISIL) 1 % cream As needed       . meloxicam (MOBIC) 15 MG tablet Take 15 mg by mouth daily.          Allergies  Allergen Reactions  . Demerol   . Flomax (Tamsulosin Hcl)   . Morphine And Related     No family history on file.  BP 122/60  Pulse 56  Temp(Src) 98.6 F (37 C) (Oral)  Ht 6' (1.829 m)  Wt 198 lb 9.6 oz (90.084 kg)  BMI 26.93 kg/m2  SpO2 95%  Review of Systems denies hypoglycemia      Objective:   Physical Exam Pulses: dorsalis pedis intact bilat.   Feet: no deformity.  no ulcer on the feet.  feet are of normal color and temp.  Trace bilat  leg edema.  There is bilteral onychomycosis.  There are scales on the legs.   Neuro: sensation is intact to touch on the feet.    Lab Results  Component Value Date   HGBA1C 9.4* 01/06/2011   Assessment & Plan:  Dm.  He may do better with a simpler regimen

## 2011-01-07 ENCOUNTER — Telehealth: Payer: Self-pay | Admitting: *Deleted

## 2011-01-07 NOTE — Telephone Encounter (Signed)
It is a little too soon to increase.  Please call mon if it is till over 200

## 2011-01-07 NOTE — Telephone Encounter (Signed)
Pt started taking new insulin (Lantus) this morning. Before breakfast his CBG was 212, and after taking insulin and eating breakfast his CBG is 331. Pt is asking for MD's advisement.

## 2011-01-07 NOTE — Telephone Encounter (Signed)
Pt informed of MD's advisement. 

## 2011-01-10 ENCOUNTER — Telehealth: Payer: Self-pay | Admitting: *Deleted

## 2011-01-10 ENCOUNTER — Ambulatory Visit: Payer: Medicare Other | Admitting: Physical Therapy

## 2011-01-10 NOTE — Telephone Encounter (Signed)
Update: Patient AM fasting CBG [212], took 70 units insulin & ate breakfast, CBG [331]  Rebeca Alert, [facility] 01/07/2011 4:36 PM Signed  Pt informed of MD's advisement Renato Shin, MD 01/07/2011 4:05 PM Signed  It is a little too soon to increase. Please call mon if it is till over Annetta South, [facility] 01/07/2011 2:23 PM Signed  Pt started taking new insulin (Lantus) this morning. Before breakfast his CBG was 212, and after taking insulin and eating breakfast his CBG is 331. Pt is asking for MD's advisement.

## 2011-01-10 NOTE — Telephone Encounter (Signed)
Please increase to 80

## 2011-01-11 NOTE — Telephone Encounter (Signed)
Pt advised of increase in insulin

## 2011-01-12 ENCOUNTER — Encounter: Payer: Medicare Other | Admitting: Physical Therapy

## 2011-01-13 ENCOUNTER — Other Ambulatory Visit: Payer: Self-pay | Admitting: *Deleted

## 2011-01-13 NOTE — Telephone Encounter (Signed)
[facility] sent fax regarding pt. . He states he is testing up to 10 x per day and they are requesting new rx-please advise

## 2011-01-14 MED ORDER — GLUCOSE BLOOD VI STRP: 1.0000 | ORAL_STRIP | Freq: Two times a day (BID) | Status: DC

## 2011-01-14 NOTE — Telephone Encounter (Signed)
rx sent

## 2011-01-15 ENCOUNTER — Ambulatory Visit (INDEPENDENT_AMBULATORY_CARE_PROVIDER_SITE_OTHER): Payer: Medicare Other | Admitting: Family Medicine

## 2011-01-15 ENCOUNTER — Encounter: Payer: Self-pay | Admitting: Family Medicine

## 2011-01-15 VITALS — BP 100/70 | HR 65 | Temp 98.1°F | Wt 202.0 lb

## 2011-01-15 DIAGNOSIS — E119 Type 2 diabetes mellitus without complications: Secondary | ICD-10-CM

## 2011-01-15 NOTE — Progress Notes (Signed)
Advised to come into Sat clinic by on call RN due to sugar readings.  Was 302 last night after eating.  He didn't check his sugar initially this AM.  He exercised this AM and then he dropped to 96.  Hefelt like he was low, felt shaky.  His sugar came up after a snack.   He normally takes lantus in AM- recent start.  80 units in AM.  He took it this AM and ate breakfast.  Asking for advice.  Meds, vitals, and allergies reviewed.   ROS: See HPI.  Otherwise, noncontributory.  nad ncat Mmm rrr ctab ext well perfused.

## 2011-01-15 NOTE — Assessment & Plan Note (Signed)
Continue current meds, snack before exercise and notify Dr. Loanne Drilling with sugar readings later in the week.  He agrees. Nontoxic.

## 2011-01-15 NOTE — Patient Instructions (Signed)
Don't change your meds for now. Keep checking your sugar in the morning.  If you are going to exercise before eating breakfast, at least eat a snack first.  Call Dr. Lissa Merlin' clinic with your readings.

## 2011-01-17 ENCOUNTER — Telehealth: Payer: Self-pay

## 2011-01-17 NOTE — Telephone Encounter (Signed)
[facility] Triage Call Report Triage Record Num: [id number] Operator: Thereasa Parkin Patient Name: Gregory Hatfield Call Date & Time: 01/15/2011 10:52:07AM Patient Phone: [phone] PCP: Renato Shin Patient Gender: Male PCP Fax : [phone] Patient DOB: 1926-05-11 Practice Name: [facility] Reason for Call: 77 yo. [phone] called re low blood sugar; Blood sugar was 250 at 0900 01/14/11 before took 80 units Lantis solarstar insulin, then blood sugar 307 hs 2400. Reports felt symptomatic with BS at 96 at 1000 01/15/11 after exercise. Ate candy and then yogurt, ritz cracker with peanut butter with blood sugar at 121 at 1115. Began new Lantis Solarstar insulin qd on 01/13/11. Instructed OK to take Lantis dose now, eat breakfast then see MD. Durward Fortes to see MD now MD d/t nursing judgement r/t hyperglycemia > 250 even after taking extra insulin per Diabetes Control Problems Guideline. Protocol(s) Used: Diabetes: Control Problems Recommended Outcome per Protocol: Call Provider Immediately Reason for Outcome: Blood sugar 250 mg/dl or higher even after taking extra insulin Care Advice: ~ 01/15/2011 11:22:25AM Page 1 of 1 [id number]

## 2011-01-17 NOTE — Telephone Encounter (Signed)
Ov today or tomorrow

## 2011-01-17 NOTE — Telephone Encounter (Signed)
No answer, no VM

## 2011-01-17 NOTE — Telephone Encounter (Signed)
Pt has appt scheduled with SAE 08/02.

## 2011-01-19 ENCOUNTER — Ambulatory Visit: Payer: Medicare Other

## 2011-01-20 ENCOUNTER — Encounter: Payer: Self-pay | Admitting: Endocrinology

## 2011-01-20 ENCOUNTER — Ambulatory Visit (INDEPENDENT_AMBULATORY_CARE_PROVIDER_SITE_OTHER): Payer: Medicare Other | Admitting: Endocrinology

## 2011-01-20 VITALS — BP 128/80 | HR 50 | Temp 98.0°F | Ht 72.0 in | Wt 203.0 lb

## 2011-01-20 DIAGNOSIS — E119 Type 2 diabetes mellitus without complications: Secondary | ICD-10-CM

## 2011-01-20 LAB — GLUCOSE, POCT (MANUAL RESULT ENTRY)

## 2011-01-20 NOTE — Progress Notes (Signed)
Subjective:    Patient ID: Gregory Hatfield, male    DOB: 01-09-1926, 77 y.o.   MRN: [id number]  HPI Since insulin was increased to 80 units qam, he says he is having mild hypoglycemia.  he brings a record of his cbg's which i have reviewed today.  It varies from 70-200, with little if any trend throughout the day.  Past Medical History  Diagnosis Date  . Diabetes mellitus   . CHF (congestive heart failure)   . GERD (gastroesophageal reflux disease)   . Hypertension   . Dyslipidemia   . Glaucoma   . CVA (cerebral infarction)     Left Eye  . Diabetic retinopathy     No past surgical history on file.  History   Social History  . Marital Status: Widowed    Spouse Name: N/A    Number of Children: N/A  . Years of Education: N/A   Occupational History  . Not on file.   Social History Main Topics  . Smoking status: Former Research scientist (life sciences)  . Smokeless tobacco: Not on file  . Alcohol Use: Not on file  . Drug Use: Not on file  . Sexually Active: Not on file   Other Topics Concern  . Not on file   Social History Narrative  . No narrative on file    Current Outpatient Prescriptions on File Prior to Visit  Medication Sig Dispense Refill  . acetaminophen (TYLENOL ARTHRITIS PAIN) 650 MG CR tablet 2 tablets every 4 hours as needed       . alfuzosin (UROXATRAL) 10 MG 24 hr tablet Take 40 mg by mouth daily.       Marland Kitchen aspirin 81 MG EC tablet 1/2 tablet daily      . Bimatoprost (LUMIGAN) 0.01 % SOLN One drop in the right eye at bedtime      . brimonidine (ALPHAGAN P) 0.1 % SOLN Place 1 drop into both eyes 2 (two) times daily.       . Bromfenac Sodium (BROMDAY) 0.09 % (DAILY) SOLN Place 1 drop into the right eye daily.        . carvedilol (COREG) 12.5 MG tablet Take 12.5 mg by mouth 2 (two) times daily with a meal.       . Cholecalciferol (VITAMIN D3) 5000 UNITS CAPS Take 1 capsule by mouth 2 (two) times daily.        Marland Kitchen diltiazem (DILACOR XR) 240 MG 24 hr capsule Take 240 mg by mouth daily.         . dorzolamide (TRUSOPT) 2 % ophthalmic solution Place 1 drop into the right eye 2 (two) times daily.        . furosemide (LASIX) 40 MG tablet Take 40 mg by mouth daily.       Marland Kitchen glucose blood (ONE TOUCH ULTRA TEST) test strip 1 each by Other route 2 (two) times daily. and lancet 250.01  100 each  12  . hydrALAZINE (APRESOLINE) 25 MG tablet Take 12.5 mg by mouth 2 (two) times daily.        . insulin glargine (LANTUS SOLOSTAR) 100 UNIT/ML injection Inject 80 Units into the skin daily.        . Insulin Pen Needle (B-D UF III MINI PEN NEEDLES) 31G X 5 MM MISC 1 each by Does not apply route 4 (four) times daily.        Marland Kitchen latanoprost (XALATAN) 0.005 % ophthalmic solution Place 1 drop into the right eye 2 (two) times daily.        Marland Kitchen  lisinopril (PRINIVIL,ZESTRIL) 20 MG tablet Take 20 mg by mouth daily.       . meloxicam (MOBIC) 15 MG tablet Take 15 mg by mouth daily.        . Multiple Vitamin (MULTIVITAMIN) tablet Take 1 tablet by mouth daily.        Marland Kitchen oxybutynin (DITROPAN-XL) 5 MG 24 hr tablet Take 5 mg by mouth daily.        . Polyethylene Glycol 3350 (MIRALAX PO) Take by mouth as needed.        . pravastatin (PRAVACHOL) 40 MG tablet Take 40 mg by mouth daily.        . prednisoLONE acetate (PRED FORTE) 1 % ophthalmic suspension Place 1 drop into the left eye daily.        . psyllium (METAMUCIL) 58.6 % packet Take 1 packet by mouth 2 (two) times daily.        Marland Kitchen terbinafine (LAMISIL) 1 % cream As needed       . insulin aspart (NOVOLOG) 100 UNIT/ML injection Inject into the skin as needed. For CBG > 250 as directed         Allergies  Allergen Reactions  . Demerol   . Flomax (Tamsulosin Hcl)   . Morphine And Related     No family history on file.  BP 128/80  Pulse 50  Temp(Src) 98 F (36.7 C) (Oral)  Ht 6' (1.829 m)  Wt 203 lb (92.08 kg)  BMI 27.53 kg/m2  SpO2 97%  Review of Systems Denies loc.    Objective:   Physical Exam elderly, frail, no distress Gait: steady with a cane.       Assessment & Plan:  Dm, therapy limited by pt's need for a simple regimen.  Overcontrolled.

## 2011-01-20 NOTE — Patient Instructions (Addendum)
Reduce lantus to 70 units each morning. Please make a follow-up appointment in 1 month check your blood sugar 4 times a day--before the 3 meals, and at bedtime.  also check if you have symptoms of your blood sugar being too high or too low.  please keep a record of the readings and bring it to your next appointment here.  please call us sooner if you are having low blood sugar episodes.   On this type of insulin schedule, meals should not be missed or delayed.

## 2011-01-22 ENCOUNTER — Encounter: Payer: Self-pay | Admitting: Family Medicine

## 2011-01-22 ENCOUNTER — Ambulatory Visit (INDEPENDENT_AMBULATORY_CARE_PROVIDER_SITE_OTHER): Payer: Medicare Other | Admitting: Family Medicine

## 2011-01-22 VITALS — BP 130/80 | HR 64 | Temp 98.6°F | Wt 202.5 lb

## 2011-01-22 DIAGNOSIS — E119 Type 2 diabetes mellitus without complications: Secondary | ICD-10-CM

## 2011-01-22 MED ORDER — INSULIN GLARGINE 100 UNIT/ML ~~LOC~~ SOLN: 60.0000 [IU] | SUBCUTANEOUS | Status: DC

## 2011-01-22 NOTE — Assessment & Plan Note (Addendum)
Endorsing symptomatic hypoglycemic episode this am to 47. Therefore advised to drop lantus to 60 units daily and f/u with endo this week. Self checked sugar in office today - 259. Advised to bring all his medicines to next appt with PCP to review as some confusion on med rec today. nontoxic today.

## 2011-01-22 NOTE — Progress Notes (Signed)
  Subjective:    Patient ID: Gregory Hatfield, male    DOB: [birth year], 77 y.o.   MRN: [id number]  HPI CC: insulin question  Seen this week by Dr. Loanne Drilling who decreased lantus to 70u qam.  Was having some hypoglycemic sxs (with cbg 90s) on 80u qam.  Had steroid shot in hip 1 mo ago.  Since then blood sugar has been off wack according to pt.  brings log of sugars - cbgs 156-259 in last 24 hours.  However, states that one reading was 47 this morning, which prompted him to call clinic today.  Felt low -shakey and dizzy, took candy which raised sugar to 90s (this is not in log from today).  In the past week, log shows sugars ranging from 71 to 225.  No lows.  No identifiable pattern.  States lantus making him feel off.  Previously on levemir.  levemir didn't cause these changes.  Lives alone.  Review of Systems Per HPI    Objective:   Physical Exam  Nursing note and vitals reviewed. Constitutional: He appears well-developed and well-nourished. No distress.   Assessment & Plan:

## 2011-01-22 NOTE — Patient Instructions (Addendum)
Lantus is a long acting insulin.  You won't notice changes over minutes to hours but rather days.  Take daily in morning at same time. Keep track of sugars - before each meal and at bedtime. Decrease to 60 units of lantus and follow up with Dr. Melford Aase or Dr. Loanne Drilling this week, bring log of sugars. Please make appointment with Dr. Melford Aase to review all your medicines - bring all the medicines you take to your next appointment. If any more lows, let us know.

## 2011-01-26 ENCOUNTER — Ambulatory Visit: Payer: Medicare Other | Attending: Endocrinology | Admitting: Physical Therapy

## 2011-01-26 DIAGNOSIS — M25569 Pain in unspecified knee: Secondary | ICD-10-CM | POA: Insufficient documentation

## 2011-01-26 DIAGNOSIS — IMO0001 Reserved for inherently not codable concepts without codable children: Secondary | ICD-10-CM | POA: Insufficient documentation

## 2011-01-26 DIAGNOSIS — M25559 Pain in unspecified hip: Secondary | ICD-10-CM | POA: Insufficient documentation

## 2011-02-01 ENCOUNTER — Ambulatory Visit: Payer: Medicare Other | Admitting: Physical Therapy

## 2011-02-04 ENCOUNTER — Ambulatory Visit: Payer: Medicare Other

## 2011-02-05 ENCOUNTER — Encounter: Payer: Self-pay | Admitting: Family Medicine

## 2011-02-05 ENCOUNTER — Ambulatory Visit (INDEPENDENT_AMBULATORY_CARE_PROVIDER_SITE_OTHER): Payer: Medicare Other | Admitting: Family Medicine

## 2011-02-05 VITALS — BP 120/60 | HR 56 | Temp 98.2°F | Wt 199.0 lb

## 2011-02-05 DIAGNOSIS — E119 Type 2 diabetes mellitus without complications: Secondary | ICD-10-CM

## 2011-02-05 MED ORDER — INSULIN DETEMIR 100 UNIT/ML ~~LOC~~ SOLN: 60.0000 [IU] | Freq: Every day | SUBCUTANEOUS | Status: DC

## 2011-02-05 MED ORDER — INSULIN ASPART 100 UNIT/ML ~~LOC~~ SOLN: SUBCUTANEOUS | Status: DC

## 2011-02-05 NOTE — Progress Notes (Signed)
  Subjective:    Patient ID: Gregory Hatfield, male    DOB: 1926/05/30, 77 y.o.   MRN: [id number]  HPI   77 year old male with poorly controlled  Brittle diabetes presents today with high and lowCBGs. He has been seen at the last 3 Saturday clinic for intermittant hypoglycemia. Lantus has been reduced to 60 Units daily  Last saw his ENDO Dr Loanne Drilling on 01/20/11.  Per his blood sugars.. Running 151- 258-400 .Marland Kitchen NO LOWs in last week, but he feels he is still having intermittant symptoms of low.  He has been taking 50 Units of Lantus daily.  Pt feels strongly that he was not having these fluctuations when he was on Levemir 67 Units in the past.  He felt that he could work better on this in past.. He could tell when it was high or low.  He was told he was changed to Lantus after steroid injections and very high numbers.  Lab Results  Component Value Date   HGBA1C 9.4* 01/06/2011      Review of Systems  Constitutional: Negative for fever and fatigue.  HENT: Negative for congestion and rhinorrhea.   Respiratory: Negative for cough, shortness of breath and wheezing.   Cardiovascular: Negative for chest pain, palpitations and leg swelling.  Gastrointestinal: Negative for diarrhea, constipation, blood in stool and abdominal distention.       Objective:   Physical Exam  Constitutional: Vital signs are normal. He appears well-developed and well-nourished.       Hard of hearing, elderly male  HENT:  Head: Normocephalic.  Right Ear: Hearing normal.  Left Ear: Hearing normal.  Nose: Nose normal.  Mouth/Throat: Oropharynx is clear and moist and mucous membranes are normal.  Neck: Trachea normal. Carotid bruit is not present. No mass and no thyromegaly present.  Cardiovascular: Normal rate, regular rhythm and normal pulses.  Exam reveals no gallop, no distant heart sounds and no friction rub.   No murmur heard.      No peripheral edema  Pulmonary/Chest: Effort normal and breath sounds  normal. No respiratory distress.  Skin: Skin is warm, dry and intact. No rash noted.  Psychiatric: He has a normal mood and affect. His speech is normal and behavior is normal. Thought content normal.          Assessment & Plan:

## 2011-02-05 NOTE — Assessment & Plan Note (Signed)
Pt feels very strongly about returning to Levemir...issue now is highs not lows! Stop Lantus. Restart Levemir at 60 Units daily. GREEN, take everyday no matter what.  Follow blood sugar closely at home. Use Novolog fasting acting ORANGE .. If blood sugar over 250 take 2 UNits, if blood sugar above 300 take 4 Units. ONLY TAKE THIS NOVOLOG BEFORE MEALS.

## 2011-02-05 NOTE — Patient Instructions (Addendum)
Next appt with Dr. Loanne Drilling on 9/6. MAke sure to keep this appt. Stop Lantus. Restart Levemir at 60 Units daily. GREEN, take everyday no matter what.  Follow blood sugar closely at home. Use Novolog fasting acting ORANGE .. If blood sugar over 250 take 2 UNits, if blood sugar above 300 take 4 Units. ONLY TAKE THIS NOVOLOG BEFORE MEALS.

## 2011-02-08 ENCOUNTER — Ambulatory Visit: Payer: Medicare Other

## 2011-02-10 ENCOUNTER — Ambulatory Visit: Payer: Medicare Other

## 2011-02-16 ENCOUNTER — Ambulatory Visit: Payer: Medicare Other | Admitting: Physical Therapy

## 2011-02-18 ENCOUNTER — Ambulatory Visit: Payer: Medicare Other | Admitting: Physical Therapy

## 2011-02-23 ENCOUNTER — Ambulatory Visit (INDEPENDENT_AMBULATORY_CARE_PROVIDER_SITE_OTHER): Payer: Medicare Other | Admitting: Endocrinology

## 2011-02-23 ENCOUNTER — Encounter: Payer: Self-pay | Admitting: Endocrinology

## 2011-02-23 DIAGNOSIS — E119 Type 2 diabetes mellitus without complications: Secondary | ICD-10-CM

## 2011-02-23 NOTE — Patient Instructions (Addendum)
Continue levemir, 67 units each morning. Stop the novolog. Please make a follow-up appointment in 1 month check your blood sugar 4 times a day--before the 3 meals, and at bedtime.  also check if you have symptoms of your blood sugar being too high or too low.  please keep a record of the readings and bring it to your next appointment here.  please call us sooner if you are having low blood sugar episodes.   On this type of insulin schedule, meals should not be missed or delayed.

## 2011-02-23 NOTE — Progress Notes (Signed)
Subjective:    Patient ID: Gregory Hatfield, male    DOB: [birth year], 77 y.o.   MRN: [id number]  HPI pt states he feels well in general.  He was seen several times here since last ov with me.  cbg's increased due to steroid injection, and lantus was changed to levemir, due to am-hypoglycemia.  Since on his current regimen (levemir 67 units qam) he has not had hypoglycemia.  he brings a record of his cbg's which i have reviewed today.  It is sometimes below 100, when he takes prn novolog.   Past Medical History  Diagnosis Date  . Diabetes mellitus   . CHF (congestive heart failure)   . GERD (gastroesophageal reflux disease)   . Hypertension   . Dyslipidemia   . Glaucoma   . CVA (cerebral infarction)     Left Eye  . Diabetic retinopathy     No past surgical history on file.  History   Social History  . Marital Status: Widowed    Spouse Name: N/A    Number of Children: N/A  . Years of Education: N/A   Occupational History  . Not on file.   Social History Main Topics  . Smoking status: Former Research scientist (life sciences)  . Smokeless tobacco: Not on file  . Alcohol Use: Not on file  . Drug Use: Not on file  . Sexually Active: Not on file   Other Topics Concern  . Not on file   Social History Narrative  . No narrative on file    Current Outpatient Prescriptions on File Prior to Visit  Medication Sig Dispense Refill  . acetaminophen (TYLENOL ARTHRITIS PAIN) 650 MG CR tablet 2 tablets every 4 hours as needed       . aspirin 81 MG EC tablet 1/2 tablet daily      . Bimatoprost (LUMIGAN) 0.01 % SOLN One drop in the right eye at bedtime      . brimonidine (ALPHAGAN P) 0.1 % SOLN Place 1 drop into both eyes 2 (two) times daily.       . Bromfenac Sodium (BROMDAY) 0.09 % (DAILY) SOLN Place 1 drop into the right eye daily.        . carvedilol (COREG) 12.5 MG tablet Take 12.5 mg by mouth 2 (two) times daily with a meal.       . Cholecalciferol (VITAMIN D3) 5000 UNITS CAPS Take 1 capsule by mouth 2  (two) times daily.        Marland Kitchen diltiazem (DILACOR XR) 240 MG 24 hr capsule Take 240 mg by mouth daily.        . dorzolamide (TRUSOPT) 2 % ophthalmic solution Place 1 drop into the right eye 2 (two) times daily.        . furosemide (LASIX) 40 MG tablet Take 40 mg by mouth daily.       Marland Kitchen glucose blood (ONE TOUCH ULTRA TEST) test strip 1 each by Other route 2 (two) times daily. and lancet 250.01  100 each  12  . hydrALAZINE (APRESOLINE) 25 MG tablet Take 12.5 mg by mouth 2 (two) times daily.        . Insulin Pen Needle (B-D UF III MINI PEN NEEDLES) 31G X 5 MM MISC 1 each by Does not apply route 4 (four) times daily.        Marland Kitchen latanoprost (XALATAN) 0.005 % ophthalmic solution Place 1 drop into the right eye 2 (two) times daily.        Marland Kitchen  lisinopril (PRINIVIL,ZESTRIL) 20 MG tablet Take 20 mg by mouth daily.       . meloxicam (MOBIC) 15 MG tablet Take 15 mg by mouth daily.        . Multiple Vitamin (MULTIVITAMIN) tablet Take 1 tablet by mouth daily.        . Polyethylene Glycol 3350 (MIRALAX PO) Take by mouth as needed.        . pravastatin (PRAVACHOL) 40 MG tablet Take 40 mg by mouth daily.        . prednisoLONE acetate (PRED FORTE) 1 % ophthalmic suspension Place 1 drop into the left eye daily.        . psyllium (METAMUCIL) 58.6 % packet Take 1 packet by mouth 2 (two) times daily.        Marland Kitchen terbinafine (LAMISIL) 1 % cream As needed         Allergies  Allergen Reactions  . Demerol   . Flomax (Tamsulosin Hcl)   . Morphine And Related     No family history on file.  BP 124/64  Pulse 60  Temp(Src) 98.2 F (36.8 C) (Oral)  Ht 6' (1.829 m)  Wt 203 lb 9.6 oz (92.352 kg)  BMI 27.61 kg/m2  SpO2 95%  Review of Systems Denies loc    Objective:   Physical Exam Pulses: dorsalis pedis intact bilat.   Feet: no deformity.  no ulcer on the feet.  feet are of normal temp.   There is spotty hyperpigmentation of the legs.  There is 1+ bilat leg edema.  There is bilteral onychomycosis Neuro: sensation is  intact to touch on the feet     Assessment & Plan:  Dm, therapy limited by pt's need for a simple regimen.  Now that he is 1 month out from the steroid injection, he no longer needs the novolog, and it is now complicating his regimen.

## 2011-02-24 ENCOUNTER — Ambulatory Visit: Payer: Medicare Other | Admitting: Endocrinology

## 2011-03-14 LAB — URINALYSIS, ROUTINE W REFLEX MICROSCOPIC
Bilirubin Urine: NEGATIVE
Glucose, UA: NEGATIVE
Hgb urine dipstick: NEGATIVE
Ketones, ur: NEGATIVE
Nitrite: NEGATIVE
Protein, ur: NEGATIVE
Specific Gravity, Urine: 1.009
Urobilinogen, UA: 0.2
pH: 6.5

## 2011-03-14 LAB — CBC
HCT: 38.7 — ABNORMAL LOW
Hemoglobin: 12.8 — ABNORMAL LOW
MCHC: 33.2
MCV: 88.9
Platelets: 226
RBC: 4.35
RDW: 13.9
WBC: 6.4

## 2011-03-14 LAB — BASIC METABOLIC PANEL
BUN: 17
Chloride: 106
Glucose, Bld: 194 — ABNORMAL HIGH
Potassium: 3.4 — ABNORMAL LOW

## 2011-03-14 LAB — BASIC METABOLIC PANEL WITH GFR
CO2: 29
Calcium: 9.1
Creatinine, Ser: 1.58 — ABNORMAL HIGH
GFR calc Af Amer: 51 — ABNORMAL LOW
GFR calc non Af Amer: 43 — ABNORMAL LOW
Sodium: 139

## 2011-03-25 ENCOUNTER — Ambulatory Visit (INDEPENDENT_AMBULATORY_CARE_PROVIDER_SITE_OTHER): Payer: Medicare Other | Admitting: Endocrinology

## 2011-03-25 ENCOUNTER — Other Ambulatory Visit (INDEPENDENT_AMBULATORY_CARE_PROVIDER_SITE_OTHER): Payer: Medicare Other

## 2011-03-25 ENCOUNTER — Encounter: Payer: Self-pay | Admitting: Endocrinology

## 2011-03-25 VITALS — BP 118/70 | HR 60 | Temp 98.0°F | Ht 70.0 in | Wt 204.0 lb

## 2011-03-25 DIAGNOSIS — E119 Type 2 diabetes mellitus without complications: Secondary | ICD-10-CM

## 2011-03-25 DIAGNOSIS — Z79899 Other long term (current) drug therapy: Secondary | ICD-10-CM

## 2011-03-25 NOTE — Progress Notes (Signed)
Subjective:    Patient ID: Gregory Hatfield, male    DOB: [birth year], 77 y.o.   MRN: [id number]  HPI pt states he feels well in general.  he brings a record of his cbg's which i have reviewed today.  It varies from 86-300.  There is no trend throughout the day.   Past Medical History  Diagnosis Date  . Diabetes mellitus   . CHF (congestive heart failure)   . GERD (gastroesophageal reflux disease)   . Hypertension   . Dyslipidemia   . Glaucoma   . CVA (cerebral infarction)     Left Eye  . Diabetic retinopathy     No past surgical history on file.  History   Social History  . Marital Status: Widowed    Spouse Name: N/A    Number of Children: N/A  . Years of Education: N/A   Occupational History  . Not on file.   Social History Main Topics  . Smoking status: Former Research scientist (life sciences)  . Smokeless tobacco: Not on file  . Alcohol Use: Not on file  . Drug Use: Not on file  . Sexually Active: Not on file   Other Topics Concern  . Not on file   Social History Narrative  . No narrative on file    Current Outpatient Prescriptions on File Prior to Visit  Medication Sig Dispense Refill  . acetaminophen (TYLENOL ARTHRITIS PAIN) 650 MG CR tablet 2 tablets every 4 hours as needed       . aspirin 81 MG EC tablet 1/2 tablet daily      . Bimatoprost (LUMIGAN) 0.01 % SOLN One drop in the right eye at bedtime      . brimonidine (ALPHAGAN P) 0.1 % SOLN Place 1 drop into both eyes 2 (two) times daily.       . carvedilol (COREG) 12.5 MG tablet Take 12.5 mg by mouth 2 (two) times daily with a meal.       . Cholecalciferol (VITAMIN D3) 5000 UNITS CAPS Take 1 capsule by mouth 2 (two) times daily.        Marland Kitchen diltiazem (DILACOR XR) 240 MG 24 hr capsule Take 240 mg by mouth daily.        . furosemide (LASIX) 40 MG tablet Take 40 mg by mouth daily.       Marland Kitchen glucose blood (ONE TOUCH ULTRA TEST) test strip 1 each by Other route 2 (two) times daily. and lancet 250.01  100 each  12  . hydrALAZINE  (APRESOLINE) 25 MG tablet Take 12.5 mg by mouth 2 (two) times daily.        . insulin detemir (LEVEMIR) 100 UNIT/ML injection Inject 67 Units into the skin daily.        . Insulin Pen Needle (B-D UF III MINI PEN NEEDLES) 31G X 5 MM MISC 1 each by Does not apply route 4 (four) times daily.        Marland Kitchen latanoprost (XALATAN) 0.005 % ophthalmic solution Place 1 drop into the right eye 2 (two) times daily.        Marland Kitchen lisinopril (PRINIVIL,ZESTRIL) 20 MG tablet Take 20 mg by mouth daily.       . Multiple Vitamin (MULTIVITAMIN) tablet Take 1 tablet by mouth daily.        . pravastatin (PRAVACHOL) 40 MG tablet Take 40 mg by mouth daily.        . psyllium (METAMUCIL) 58.6 % packet Take 1 packet by mouth 2 (two) times  daily.        . terbinafine (LAMISIL) 1 % cream As needed         Allergies  Allergen Reactions  . Demerol   . Flomax (Tamsulosin Hcl)   . Morphine And Related     No family history on file.  BP 118/70  Pulse 60  Temp(Src) 98 F (36.7 C) (Oral)  Ht 5\' 10"  (1.778 m)  Wt 204 lb (92.534 kg)  BMI 29.27 kg/m2  SpO2 94%  Review of Systems denies hypoglycemia    Objective:   Physical Exam VITAL SIGNS:  See vs page GENERAL: no distress SKIN: Insulin injection sites at the anterior abdomen are normal   Lab Results  Component Value Date   HGBA1C 7.3* 03/25/2011      Assessment & Plan:  Dm, with improved control.  this is the best control this pt should aim for, given this regimen, which does match insulin to his changing needs throughout the day

## 2011-03-25 NOTE — Patient Instructions (Addendum)
Please make a follow-up appointment in January. check your blood sugar 4 times a day--before the 3 meals, and at bedtime.  also check if you have symptoms of your blood sugar being too high or too low.  please keep a record of the readings and bring it to your next appointment here.  please call us sooner if you are having low blood sugar episodes.   On this type of insulin schedule, meals should not be missed or delayed.  blood tests are being requested for you today.  please call [phone] to hear your test results.  You will be prompted to enter the 9-digit "MRN" number that appears at the top left of this page, followed by #.  Then you will hear the message.                                                                                                                   Same insulin Ret 3 mos

## 2011-06-28 ENCOUNTER — Other Ambulatory Visit: Payer: Self-pay | Admitting: Endocrinology

## 2011-07-01 ENCOUNTER — Ambulatory Visit (INDEPENDENT_AMBULATORY_CARE_PROVIDER_SITE_OTHER): Payer: Medicare Other | Admitting: Endocrinology

## 2011-07-01 ENCOUNTER — Encounter: Payer: Self-pay | Admitting: Endocrinology

## 2011-07-01 ENCOUNTER — Other Ambulatory Visit (INDEPENDENT_AMBULATORY_CARE_PROVIDER_SITE_OTHER): Payer: Medicare Other

## 2011-07-01 VITALS — BP 128/62 | HR 58 | Temp 98.0°F | Ht 72.0 in | Wt 197.0 lb

## 2011-07-01 DIAGNOSIS — E119 Type 2 diabetes mellitus without complications: Secondary | ICD-10-CM

## 2011-07-01 DIAGNOSIS — N400 Enlarged prostate without lower urinary tract symptoms: Secondary | ICD-10-CM

## 2011-07-01 DIAGNOSIS — Z79899 Other long term (current) drug therapy: Secondary | ICD-10-CM

## 2011-07-01 NOTE — Progress Notes (Signed)
Subjective:    Patient ID: Gregory Hatfield, male    DOB: 1925-06-25, 78 y.o.   MRN: [id number]  HPI Pt returns for f/u of insulin-requiring DM (1975).  pt states he feels well in general.  he brings a record of his cbg's which i have reviewed today.  It varies from 70-300, but most are in the 100's.  There is no trend throughout the day.   Past Medical History  Diagnosis Date  . Diabetes mellitus   . CHF (congestive heart failure)   . GERD (gastroesophageal reflux disease)   . Hypertension   . Dyslipidemia   . Glaucoma   . CVA (cerebral infarction)     Left Eye  . Diabetic retinopathy     No past surgical history on file.  History   Social History  . Marital Status: Widowed    Spouse Name: N/A    Number of Children: N/A  . Years of Education: N/A   Occupational History  . Not on file.   Social History Main Topics  . Smoking status: Former Smoker    Quit date: 06/21/1983  . Smokeless tobacco: Not on file  . Alcohol Use: Not on file  . Drug Use: Not on file  . Sexually Active: Not on file   Other Topics Concern  . Not on file   Social History Narrative  . No narrative on file    Current Outpatient Prescriptions on File Prior to Visit  Medication Sig Dispense Refill  . acetaminophen (TYLENOL ARTHRITIS PAIN) 650 MG CR tablet 2 tablets every 4 hours as needed       . aspirin 81 MG EC tablet 1/2 tablet daily      . B-D UF III MINI PEN NEEDLES 31G X 5 MM MISC CHECK BLOOD SUGAR 4 TIMES A DAY.  100 each  6  . Bimatoprost (LUMIGAN) 0.01 % SOLN One drop in the right eye at bedtime      . brimonidine (ALPHAGAN P) 0.1 % SOLN Place 1 drop into both eyes 2 (two) times daily.       . carvedilol (COREG) 12.5 MG tablet Take 12.5 mg by mouth 2 (two) times daily with a meal.       . Cholecalciferol (VITAMIN D3) 5000 UNITS CAPS Take 1 capsule by mouth 2 (two) times daily.        Marland Kitchen diltiazem (DILACOR XR) 240 MG 24 hr capsule Take 240 mg by mouth daily.        .  dorzolamide-timolol (COSOPT) 22.3-6.8 MG/ML ophthalmic solution Place 1 drop into the right eye 2 (two) times daily.        . furosemide (LASIX) 40 MG tablet Take 40 mg by mouth 2 (two) times daily.       Marland Kitchen glucose blood (ONE TOUCH ULTRA TEST) test strip 1 each by Other route 2 (two) times daily. and lancet 250.01  100 each  12  . hydrALAZINE (APRESOLINE) 25 MG tablet Take 12.5 mg by mouth 2 (two) times daily.        . insulin detemir (LEVEMIR) 100 UNIT/ML injection Inject 75 Units into the skin daily.       Marland Kitchen latanoprost (XALATAN) 0.005 % ophthalmic solution Place 1 drop into the right eye 2 (two) times daily.        Marland Kitchen lisinopril (PRINIVIL,ZESTRIL) 20 MG tablet Take 20 mg by mouth daily.       . Multiple Vitamin (MULTIVITAMIN) tablet Take 1 tablet by mouth daily.        Marland Kitchen  pravastatin (PRAVACHOL) 40 MG tablet Take 40 mg by mouth daily.        . psyllium (METAMUCIL) 58.6 % packet Take 1 packet by mouth 2 (two) times daily.        Marland Kitchen terbinafine (LAMISIL) 1 % cream As needed         Allergies  Allergen Reactions  . Demerol   . Flomax (Tamsulosin Hcl)   . Morphine And Related     No family history on file.  BP 128/62  Pulse 58  Temp(Src) 98 F (36.7 C) (Oral)  Ht 6' (1.829 m)  Wt 197 lb (89.359 kg)  BMI 26.72 kg/m2  SpO2 98%   Review of Systems denies hypoglycemia.      Objective:   Physical Exam VITAL SIGNS:  See vs page GENERAL: no distress Pulses: dorsalis pedis intact bilat.   Feet: no deformity.  no ulcer on the feet.  feet are of normal color and temp.  no edema.  There is bilateral onychomycosis, and slight hyperpigmentation.  Neuro: sensation is intact to touch on the feet  outside test results are reviewed: A1c=9.4 Lab Results  Component Value Date   HGBA1C 10.0* 07/01/2011      Assessment & Plan:  DM, worse

## 2011-07-01 NOTE — Patient Instructions (Addendum)
Please come back for a follow-up appointment in April.   check your blood sugar 4 times a day--before the 3 meals, and at bedtime.  also check if you have symptoms of your blood sugar being too high or too low.  please keep a record of the readings and bring it to your next appointment here.  please call us sooner if you are having low blood sugar episodes.   On this type of insulin schedule, meals should not be missed or delayed.   blood tests are being requested for you today.  please call [phone] to hear your test results.  You will be prompted to enter the 9-digit "MRN" number that appears at the top left of this page, followed by #.  Then you will hear the message.   (update: i left message on phone-tree:  Increase insulin to 75 units qd.  Please come back for a follow-up appointment in 6 weeks).

## 2011-08-01 ENCOUNTER — Other Ambulatory Visit: Payer: Self-pay | Admitting: *Deleted

## 2011-08-01 MED ORDER — INSULIN DETEMIR 100 UNIT/ML ~~LOC~~ SOLN: 75.0000 [IU] | Freq: Every day | SUBCUTANEOUS | Status: DC

## 2011-08-01 NOTE — Telephone Encounter (Signed)
R'cd fax from  [facility] for refill of Levemir Flexpen  Last OV-06/2011

## 2011-08-05 ENCOUNTER — Encounter: Payer: Self-pay | Admitting: Endocrinology

## 2011-08-05 ENCOUNTER — Ambulatory Visit (INDEPENDENT_AMBULATORY_CARE_PROVIDER_SITE_OTHER): Payer: Medicare Other | Admitting: Endocrinology

## 2011-08-05 DIAGNOSIS — E1065 Type 1 diabetes mellitus with hyperglycemia: Secondary | ICD-10-CM

## 2011-08-05 NOTE — Progress Notes (Signed)
Subjective:    Patient ID: Gregory Hatfield, male    DOB: 05/20/1926, 78 y.o.   MRN: [id number]  HPI Pt returns for f/u of insulin-requiring DM (1975).  pt states he feels well in general.  he brings a record of his cbg's which i have reviewed today.  It varies from 68-270, but most are in the 100's.  There is no trend throughout the day.  It is lowest when a meal is missed Past Medical History  Diagnosis Date  . Diabetes mellitus   . CHF (congestive heart failure)   . GERD (gastroesophageal reflux disease)   . Hypertension   . Dyslipidemia   . Glaucoma   . CVA (cerebral infarction)     Left Eye  . Diabetic retinopathy     No past surgical history on file.  History   Social History  . Marital Status: Widowed    Spouse Name: N/A    Number of Children: N/A  . Years of Education: N/A   Occupational History  . Not on file.   Social History Main Topics  . Smoking status: Former Smoker    Quit date: 06/21/1983  . Smokeless tobacco: Not on file  . Alcohol Use: Not on file  . Drug Use: Not on file  . Sexually Active: Not on file   Other Topics Concern  . Not on file   Social History Narrative  . No narrative on file    Current Outpatient Prescriptions on File Prior to Visit  Medication Sig Dispense Refill  . acetaminophen (TYLENOL ARTHRITIS PAIN) 650 MG CR tablet 2 tablets every 4 hours as needed       . aspirin 81 MG EC tablet 1/2 tablet daily      . B-D UF III MINI PEN NEEDLES 31G X 5 MM MISC CHECK BLOOD SUGAR 4 TIMES A DAY.  100 each  6  . Bimatoprost (LUMIGAN) 0.01 % SOLN One drop in the right eye at bedtime      . brimonidine (ALPHAGAN P) 0.1 % SOLN Place 1 drop into both eyes 2 (two) times daily.       . carvedilol (COREG) 12.5 MG tablet Take 12.5 mg by mouth 2 (two) times daily with a meal.       . Cholecalciferol (VITAMIN D3) 5000 UNITS CAPS Take 1 capsule by mouth 2 (two) times daily.        Marland Kitchen diltiazem (DILACOR XR) 240 MG 24 hr capsule Take 240 mg by mouth  daily.        . dorzolamide-timolol (COSOPT) 22.3-6.8 MG/ML ophthalmic solution Place 1 drop into the right eye 2 (two) times daily.        . furosemide (LASIX) 40 MG tablet Take 40 mg by mouth 2 (two) times daily.       Marland Kitchen glucose blood (ONE TOUCH ULTRA TEST) test strip 1 each by Other route 2 (two) times daily. and lancet 250.01  100 each  12  . hydrALAZINE (APRESOLINE) 25 MG tablet Take 12.5 mg by mouth 2 (two) times daily.        . insulin detemir (LEVEMIR FLEXPEN) 100 UNIT/ML injection Inject 75 Units into the skin daily.  45 mL  3  . latanoprost (XALATAN) 0.005 % ophthalmic solution Place 1 drop into the right eye 2 (two) times daily.        Marland Kitchen lisinopril (PRINIVIL,ZESTRIL) 20 MG tablet Take 20 mg by mouth daily.       Andree Elk ER (  MYRBETRIQ) 25 MG TB24 1 injection every 3 weeks per pt      . Multiple Vitamin (MULTIVITAMIN) tablet Take 1 tablet by mouth daily.        . pravastatin (PRAVACHOL) 40 MG tablet Take 40 mg by mouth daily.        . psyllium (METAMUCIL) 58.6 % packet Take 1 packet by mouth 2 (two) times daily.          Allergies  Allergen Reactions  . Demerol   . Flomax (Tamsulosin Hcl)   . Morphine And Related     No family history on file.  BP 130/68  Pulse 46  Temp(Src) 97.2 F (36.2 C) (Oral)  Ht 6\' 1"  (1.854 m)  Wt 200 lb (90.719 kg)  BMI 26.39 kg/m2  SpO2 97%    Review of Systems Denies loc    Objective:   Physical Exam VITAL SIGNS:  See vs page GENERAL: no distress Gait: slow but steady     Assessment & Plan:  DM, therapy limited by pt's need for a simple regimen.  Improved control

## 2011-08-05 NOTE — Patient Instructions (Addendum)
Please come back for a follow-up appointment in April.   check your blood sugar 4 times a day--before the 3 meals, and at bedtime.  also check if you have symptoms of your blood sugar being too high or too low.  please keep a record of the readings and bring it to your next appointment here.  please call us sooner if you are having low blood sugar episodes.   On this type of insulin schedule, you should eat meals on a regular schedule.  If a meal is missed or significantly delayed, your blood sugar could go low. Please continue the same insulin.

## 2011-09-29 ENCOUNTER — Ambulatory Visit: Payer: Medicare Other | Admitting: Endocrinology

## 2011-10-07 ENCOUNTER — Ambulatory Visit (INDEPENDENT_AMBULATORY_CARE_PROVIDER_SITE_OTHER): Payer: Medicare Other | Admitting: Endocrinology

## 2011-10-07 ENCOUNTER — Encounter: Payer: Self-pay | Admitting: Endocrinology

## 2011-10-07 VITALS — BP 144/72 | HR 60 | Temp 98.2°F | Ht 72.0 in | Wt 208.0 lb

## 2011-10-07 DIAGNOSIS — E1029 Type 1 diabetes mellitus with other diabetic kidney complication: Secondary | ICD-10-CM

## 2011-10-07 DIAGNOSIS — N058 Unspecified nephritic syndrome with other morphologic changes: Secondary | ICD-10-CM

## 2011-10-07 NOTE — Patient Instructions (Addendum)
Please come back for a follow-up appointment in 3 months. check your blood sugar 4 times a day--before the 3 meals, and at bedtime.  also check if you have symptoms of your blood sugar being too high or too low.  please keep a record of the readings and bring it to your next appointment here.  please call us sooner if you are having low blood sugar episodes.   On this type of insulin schedule, you should eat meals on a regular schedule.  If a meal is missed or significantly delayed, your blood sugar could go low. On this insulin schedule, meals should not be missed or delayed.

## 2011-10-07 NOTE — Progress Notes (Signed)
Subjective:    Patient ID: Gregory Hatfield, male    DOB: 1925-09-09, 78 y.o.   MRN: [id number]  HPI Pt returns for f/u of insulin-requiring DM (dx'ed Q000111Q, complicated by renal insuff and CHF).  pt states he feels well in general.  In particular, dizziness is better.  he brings a record of his cbg's which i have reviewed today.  It varies from 52-220, but most are in the 100's.  There is no trend throughout the day.  It is lowest when a meal is missed (which happens twice a week).  Past Medical History  Diagnosis Date  . Diabetes mellitus   . CHF (congestive heart failure)   . GERD (gastroesophageal reflux disease)   . Hypertension   . Dyslipidemia   . Glaucoma   . CVA (cerebral infarction)     Left Eye  . Diabetic retinopathy     No past surgical history on file.  History   Social History  . Marital Status: Widowed    Spouse Name: N/A    Number of Children: N/A  . Years of Education: N/A   Occupational History  . Not on file.   Social History Main Topics  . Smoking status: Former Smoker    Quit date: 06/21/1983  . Smokeless tobacco: Not on file  . Alcohol Use: Not on file  . Drug Use: Not on file  . Sexually Active: Not on file   Other Topics Concern  . Not on file   Social History Narrative  . No narrative on file    Current Outpatient Prescriptions on File Prior to Visit  Medication Sig Dispense Refill  . acetaminophen (TYLENOL ARTHRITIS PAIN) 650 MG CR tablet 2 tablets every 4 hours as needed       . aspirin 81 MG EC tablet 1/2 tablet daily      . B-D UF III MINI PEN NEEDLES 31G X 5 MM MISC CHECK BLOOD SUGAR 4 TIMES A DAY.  100 each  6  . Bimatoprost (LUMIGAN) 0.01 % SOLN One drop in the right eye at bedtime      . brimonidine (ALPHAGAN P) 0.1 % SOLN Place 1 drop into both eyes 2 (two) times daily.       . carvedilol (COREG) 12.5 MG tablet Take 12.5 mg by mouth 2 (two) times daily with a meal.       . Cholecalciferol (VITAMIN D3) 5000 UNITS CAPS Take 1  capsule by mouth 2 (two) times daily.        . clobetasol ointment (TEMOVATE) 0.05 % Apply topically 2 (two) times daily.       Marland Kitchen diltiazem (DILACOR XR) 240 MG 24 hr capsule Take 240 mg by mouth daily.        . dorzolamide-timolol (COSOPT) 22.3-6.8 MG/ML ophthalmic solution Place 1 drop into the right eye 2 (two) times daily.        . furosemide (LASIX) 40 MG tablet Take 40 mg by mouth 2 (two) times daily.       Marland Kitchen glucose blood (ONE TOUCH ULTRA TEST) test strip 1 each by Other route 2 (two) times daily. and lancet 250.01  100 each  12  . hydrALAZINE (APRESOLINE) 25 MG tablet Take 12.5 mg by mouth 2 (two) times daily.        . insulin detemir (LEVEMIR FLEXPEN) 100 UNIT/ML injection Inject 75 Units into the skin daily.  45 mL  3  . ketoconazole (NIZORAL) 2 % cream Apply topically 2 (  two) times daily.       Marland Kitchen latanoprost (XALATAN) 0.005 % ophthalmic solution Place 1 drop into the right eye 2 (two) times daily.        Marland Kitchen lisinopril (PRINIVIL,ZESTRIL) 20 MG tablet Take 20 mg by mouth daily.       . Mirabegron ER (MYRBETRIQ) 25 MG TB24 1 injection every 3 weeks per pt      . Multiple Vitamin (MULTIVITAMIN) tablet Take 1 tablet by mouth daily.        . pravastatin (PRAVACHOL) 40 MG tablet Take 40 mg by mouth daily.        . psyllium (METAMUCIL) 58.6 % packet Take 1 packet by mouth 2 (two) times daily.        Marland Kitchen DISCONTD: insulin detemir (LEVEMIR FLEXPEN) 100 UNIT/ML injection Inject 60 Units into the skin at bedtime.  3 mL  11    Allergies  Allergen Reactions  . Demerol   . Flomax (Tamsulosin Hcl)   . Morphine And Related     No family history on file.  BP 144/72  Pulse 60  Temp(Src) 98.2 F (36.8 C) (Oral)  Ht 6' (1.829 m)  Wt 208 lb (94.348 kg)  BMI 28.21 kg/m2  SpO2 95%  Review of Systems Denies loc    Objective:   Physical Exam VITAL SIGNS:  See vs page GENERAL: no distress SKIN:  Insulin injection sites at the anterior abdomen are normal.  outside test results are  reviewed: A1c=8.6    Assessment & Plan:  DM.   needs increased rx.  However, we can't increase insulin now due to variable cbg's.

## 2011-11-04 ENCOUNTER — Other Ambulatory Visit: Payer: Self-pay | Admitting: *Deleted

## 2011-11-04 MED ORDER — INSULIN DETEMIR 100 UNIT/ML ~~LOC~~ SOLN: 75.0000 [IU] | Freq: Every day | SUBCUTANEOUS | Status: DC

## 2011-11-04 NOTE — Telephone Encounter (Signed)
R'cd fax from [facility] for refill of [place]

## 2011-12-06 ENCOUNTER — Other Ambulatory Visit: Payer: Self-pay | Admitting: Endocrinology

## 2011-12-09 ENCOUNTER — Ambulatory Visit (HOSPITAL_COMMUNITY)
Admission: RE | Admit: 2011-12-09 | Discharge: 2011-12-09 | Disposition: A | Discharge status: Home / Self Care | Payer: Medicare Other | Source: Ambulatory Visit | Attending: Internal Medicine | Admitting: Internal Medicine

## 2011-12-09 ENCOUNTER — Other Ambulatory Visit (HOSPITAL_COMMUNITY): Payer: Self-pay | Admitting: Internal Medicine

## 2011-12-09 DIAGNOSIS — J438 Other emphysema: Secondary | ICD-10-CM | POA: Insufficient documentation

## 2011-12-09 DIAGNOSIS — I509 Heart failure, unspecified: Secondary | ICD-10-CM | POA: Insufficient documentation

## 2011-12-09 DIAGNOSIS — I10 Essential (primary) hypertension: Secondary | ICD-10-CM

## 2011-12-09 DIAGNOSIS — I517 Cardiomegaly: Secondary | ICD-10-CM | POA: Insufficient documentation

## 2011-12-09 DIAGNOSIS — E119 Type 2 diabetes mellitus without complications: Secondary | ICD-10-CM | POA: Insufficient documentation

## 2012-01-09 ENCOUNTER — Encounter: Payer: Self-pay | Admitting: Endocrinology

## 2012-01-09 ENCOUNTER — Ambulatory Visit (INDEPENDENT_AMBULATORY_CARE_PROVIDER_SITE_OTHER): Payer: Medicare Other | Admitting: Endocrinology

## 2012-01-09 VITALS — BP 110/60 | HR 58 | Temp 98.1°F | Wt 202.0 lb

## 2012-01-09 DIAGNOSIS — E1029 Type 1 diabetes mellitus with other diabetic kidney complication: Secondary | ICD-10-CM

## 2012-01-09 DIAGNOSIS — E1065 Type 1 diabetes mellitus with hyperglycemia: Secondary | ICD-10-CM

## 2012-01-09 DIAGNOSIS — N058 Unspecified nephritic syndrome with other morphologic changes: Secondary | ICD-10-CM

## 2012-01-09 NOTE — Progress Notes (Signed)
Subjective:    Patient ID: Gregory Hatfield, male    DOB: Nov 16, 1925, 78 y.o.   MRN: [id number]  HPI Pt returns for f/u of insulin-requiring DM (dx'ed Q000111Q, complicated by renal insuff and CHF).  pt states he feels well in general.  In particular, dizziness is better.  he brings a record of his cbg's which i have reviewed today.  He checks 6x/day. It varies from 60-200's, but most are in the 100's.  There is no trend throughout the day.   Past Medical History  Diagnosis Date  . Diabetes mellitus   . CHF (congestive heart failure)   . GERD (gastroesophageal reflux disease)   . Hypertension   . Dyslipidemia   . Glaucoma   . CVA (cerebral infarction)     Left Eye  . Diabetic retinopathy     No past surgical history on file.  History   Social History  . Marital Status: Widowed    Spouse Name: N/A    Number of Children: N/A  . Years of Education: N/A   Occupational History  . Not on file.   Social History Main Topics  . Smoking status: Former Smoker    Quit date: 06/21/1983  . Smokeless tobacco: Not on file  . Alcohol Use: Not on file  . Drug Use: Not on file  . Sexually Active: Not on file   Other Topics Concern  . Not on file   Social History Narrative  . No narrative on file    Current Outpatient Prescriptions on File Prior to Visit  Medication Sig Dispense Refill  . acetaminophen (TYLENOL ARTHRITIS PAIN) 650 MG CR tablet 2 tablets every 4 hours as needed       . aspirin 81 MG EC tablet 1/2 tablet daily      . B-D UF III MINI PEN NEEDLES 31G X 5 MM MISC CHECK BLOOD SUGAR 4 TIMES A DAY.  100 each  6  . Bimatoprost (LUMIGAN) 0.01 % SOLN One drop in the right eye at bedtime      . brimonidine (ALPHAGAN P) 0.1 % SOLN Place 1 drop into both eyes 2 (two) times daily.       . carvedilol (COREG) 12.5 MG tablet Take 12.5 mg by mouth 2 (two) times daily with a meal.       . Cholecalciferol (VITAMIN D3) 5000 UNITS CAPS Take 1 capsule by mouth 2 (two) times daily.        Marland Kitchen  diltiazem (DILACOR XR) 240 MG 24 hr capsule Take 240 mg by mouth daily.        . dorzolamide-timolol (COSOPT) 22.3-6.8 MG/ML ophthalmic solution Place 1 drop into the right eye 2 (two) times daily.        . furosemide (LASIX) 40 MG tablet Take 40 mg by mouth 2 (two) times daily.       . hydrALAZINE (APRESOLINE) 25 MG tablet Take 12.5 mg by mouth 2 (two) times daily.        . insulin detemir (LEVEMIR FLEXPEN) 100 UNIT/ML injection Inject 75 Units into the skin daily.  45 mL  3  . latanoprost (XALATAN) 0.005 % ophthalmic solution Place 1 drop into the right eye 2 (two) times daily.        Marland Kitchen lisinopril (PRINIVIL,ZESTRIL) 20 MG tablet Take 10 mg by mouth daily. 1/2 tablet by mouth daily      . Mirabegron ER (MYRBETRIQ) 25 MG TB24 1 injection every 3 weeks per pt      .  Multiple Vitamin (MULTIVITAMIN) tablet Take 1 tablet by mouth daily.        . pravastatin (PRAVACHOL) 40 MG tablet Take 40 mg by mouth daily.        . psyllium (METAMUCIL) 58.6 % packet Take 1 packet by mouth 2 (two) times daily.        Marland Kitchen DISCONTD: insulin detemir (LEVEMIR FLEXPEN) 100 UNIT/ML injection Inject 60 Units into the skin at bedtime.  3 mL  11    Allergies  Allergen Reactions  . Demerol   . Flomax (Tamsulosin Hcl)   . Morphine And Related     No family history on file.  BP 110/60  Pulse 58  Temp 98.1 F (36.7 C) (Oral)  Wt 202 lb (91.627 kg)  SpO2 96%  Review of Systems Denies LOC    Objective:   Physical Exam VITAL SIGNS:  See vs page GENERAL: no distress Pulses: dorsalis pedis intact bilat.   Feet: no deformity.  no ulcer on the feet.  feet are of normal color and temp.  no edema.  There is bilateral onychomycosis, and slight hyperpigmentation.  Neuro: sensation is intact to touch on the feet.    outside test results are reviewed: A1c=8.1    Assessment & Plan:  DM.   Apparently improved

## 2012-01-09 NOTE — Patient Instructions (Addendum)
Please come back for a follow-up appointment in 3 months. check your blood sugar 6 times a day--before the 3 meals, and at bedtime.  also check if you have symptoms of your blood sugar being too high or too low.  please keep a record of the readings and bring it to your next appointment here.  please call us sooner if you are having low blood sugar episodes.   On this type of insulin schedule, you should eat meals on a regular schedule.  If a meal is missed or significantly delayed, your blood sugar could go low. On this insulin schedule, meals should not be missed or delayed.

## 2012-01-10 MED ORDER — GLUCOSE BLOOD VI STRP: ORAL_STRIP | Status: DC

## 2012-04-09 ENCOUNTER — Ambulatory Visit (INDEPENDENT_AMBULATORY_CARE_PROVIDER_SITE_OTHER): Payer: Medicare Other | Admitting: Endocrinology

## 2012-04-09 ENCOUNTER — Encounter: Payer: Self-pay | Admitting: Endocrinology

## 2012-04-09 VITALS — BP 128/58 | HR 57 | Temp 98.0°F | Resp 16 | Wt 203.2 lb

## 2012-04-09 DIAGNOSIS — E1029 Type 1 diabetes mellitus with other diabetic kidney complication: Secondary | ICD-10-CM

## 2012-04-09 NOTE — Patient Instructions (Addendum)
Please come back for a follow-up appointment in 3 months. check your blood sugar 6 times a day--before the 3 meals, and at bedtime.  also check if you have symptoms of your blood sugar being too high or too low.  please keep a record of the readings and bring it to your next appointment here.  please call us sooner if you are having low blood sugar episodes.   On this type of insulin schedule, you should eat meals on a regular schedule.  If a meal is missed or significantly delayed, your blood sugar could go low. To avoid the lows, carry some packets a cheese and crackers.  This way, you can avoid the lows.   Please sign a release of information from the labs from dr Melford Aase.

## 2012-04-09 NOTE — Progress Notes (Signed)
Subjective:    Patient ID: Gregory Hatfield, male    DOB: 1926/06/11, 78 y.o.   MRN: [id number]  HPI Pt returns for f/u of insulin-requiring DM (dx'ed Q000111Q, complicated by renal insuff and CHF; therapy limited by pt's need for a simple regimen).  pt states he feels well in general, except for dizziness and left knee pain.  he brings a record of his cbg's which i have reviewed today.  It varies from 50-200, but most are in the 100's.  It is mildly low if he misses a meal.  This happens once or twice a week.   Past Medical History  Diagnosis Date  . Diabetes mellitus   . CHF (congestive heart failure)   . GERD (gastroesophageal reflux disease)   . Hypertension   . Dyslipidemia   . Glaucoma(365)   . CVA (cerebral infarction)     Left Eye  . Diabetic retinopathy(362.0)     No past surgical history on file.  History   Social History  . Marital Status: Widowed    Spouse Name: N/A    Number of Children: N/A  . Years of Education: N/A   Occupational History  . Not on file.   Social History Main Topics  . Smoking status: Former Smoker    Quit date: 06/21/1983  . Smokeless tobacco: Not on file  . Alcohol Use: Not on file  . Drug Use: Not on file  . Sexually Active: Not on file   Other Topics Concern  . Not on file   Social History Narrative  . No narrative on file    Current Outpatient Prescriptions on File Prior to Visit  Medication Sig Dispense Refill  . acetaminophen (TYLENOL ARTHRITIS PAIN) 650 MG CR tablet 2 tablets every 4 hours as needed       . aspirin 81 MG EC tablet 1/2 tablet daily      . B-D UF III MINI PEN NEEDLES 31G X 5 MM MISC CHECK BLOOD SUGAR 4 TIMES A DAY.  100 each  6  . Bimatoprost (LUMIGAN) 0.01 % SOLN One drop in the right eye at bedtime      . brimonidine (ALPHAGAN P) 0.1 % SOLN Place 1 drop into both eyes 2 (two) times daily.       . carvedilol (COREG) 12.5 MG tablet Take 12.5 mg by mouth 2 (two) times daily with a meal.       . Cholecalciferol  (VITAMIN D3) 5000 UNITS CAPS Take 1 capsule by mouth 2 (two) times daily.        Marland Kitchen diltiazem (CARDIZEM CD) 240 MG 24 hr capsule       . diltiazem (DILACOR XR) 240 MG 24 hr capsule Take 240 mg by mouth daily.        . dorzolamide-timolol (COSOPT) 22.3-6.8 MG/ML ophthalmic solution Place 1 drop into the right eye 2 (two) times daily.        Marland Kitchen econazole nitrate 1 % cream Apply topically 2 (two) times daily. Apply to feet as directed      . furosemide (LASIX) 40 MG tablet Take 40 mg by mouth 2 (two) times daily.       Marland Kitchen glucose blood (ONE TOUCH ULTRA TEST) test strip Use as directed to check blood sugar 6 times a day dx 250.43  200 each  5  . hydrALAZINE (APRESOLINE) 25 MG tablet Take 12.5 mg by mouth 2 (two) times daily.        . insulin detemir (  LEVEMIR) 100 UNIT/ML injection Inject 65 Units into the skin daily.      Marland Kitchen lisinopril (PRINIVIL,ZESTRIL) 20 MG tablet Take 10 mg by mouth daily. 1/2 tablet by mouth daily      . Mirabegron ER (MYRBETRIQ) 25 MG TB24 1 injection every 3 weeks per pt      . Multiple Vitamin (MULTIVITAMIN) tablet Take 1 tablet by mouth daily.        . pravastatin (PRAVACHOL) 40 MG tablet Take 40 mg by mouth daily.        . psyllium (METAMUCIL) 58.6 % packet Take 1 packet by mouth 2 (two) times daily.        Marland Kitchen latanoprost (XALATAN) 0.005 % ophthalmic solution Place 1 drop into the right eye 2 (two) times daily.         Allergies  Allergen Reactions  . Demerol   . Flomax (Tamsulosin Hcl)   . Morphine And Related    No family history on file.  BP 128/58  Pulse 57  Temp 98 F (36.7 C) (Oral)  Resp 16  Wt 203 lb 4 oz (92.194 kg)  SpO2 98%  Review of Systems Denies LOC    Objective:   Physical Exam VITAL SIGNS:  See vs page GENERAL: no distress SKIN:  Insulin injection sites at the anterior abdomen are normal.    Assessment & Plan:  DM, with apparently improved control

## 2012-07-11 ENCOUNTER — Ambulatory Visit (INDEPENDENT_AMBULATORY_CARE_PROVIDER_SITE_OTHER): Payer: Medicare PPO | Admitting: Endocrinology

## 2012-07-11 ENCOUNTER — Telehealth: Payer: Self-pay | Admitting: Endocrinology

## 2012-07-11 VITALS — BP 130/64 | HR 56 | Wt 207.0 lb

## 2012-07-11 DIAGNOSIS — E1029 Type 1 diabetes mellitus with other diabetic kidney complication: Secondary | ICD-10-CM

## 2012-07-11 DIAGNOSIS — E1065 Type 1 diabetes mellitus with hyperglycemia: Secondary | ICD-10-CM

## 2012-07-11 NOTE — Telephone Encounter (Signed)
Spoke with pharmacist, they do have on file that pt is testing 6 times daily

## 2012-07-11 NOTE — Patient Instructions (Addendum)
Please come back for a follow-up appointment in 3 months. check your blood sugar 6 times a day--before the 3 meals, and at bedtime.  also check if you have symptoms of your blood sugar being too high or too low.  please keep a record of the readings and bring it to your next appointment here.  please call us sooner if you are having low blood sugar episodes.   On this type of insulin schedule, you should eat meals on a regular schedule.  If a meal is missed or significantly delayed, your blood sugar could go low. To avoid the lows, carry some packets a cheese and crackers.  This way, you can avoid the lows.   blood tests are being requested for you today.  We'll contact you with results.

## 2012-07-11 NOTE — Progress Notes (Signed)
Subjective:    Patient ID: Gregory Hatfield, male    DOB: [birth year], 79 y.o.   MRN: [id number]  HPI Pt returns for f/u of insulin-requiring DM (dx'ed Q000111Q, complicated by renal insuff and CHF; therapy is limited by pt's need for a simple regimen).  pt states he feels well in general.  he brings a record of his cbg's which i have reviewed today.  It varies from 48-200's, but most are in the 100's.  There is no trend throughout the day.  He has mild hypoglycemia approx twice a week. Past Medical History  Diagnosis Date  . Diabetes mellitus   . CHF (congestive heart failure)   . GERD (gastroesophageal reflux disease)   . Hypertension   . Dyslipidemia   . Glaucoma(365)   . CVA (cerebral infarction)     Left Eye  . Diabetic retinopathy(362.0)     No past surgical history on file.  History   Social History  . Marital Status: Widowed    Spouse Name: N/A    Number of Children: N/A  . Years of Education: N/A   Occupational History  . Not on file.   Social History Main Topics  . Smoking status: Former Smoker    Quit date: 06/21/1983  . Smokeless tobacco: Not on file  . Alcohol Use: Not on file  . Drug Use: Not on file  . Sexually Active: Not on file   Other Topics Concern  . Not on file   Social History Narrative  . No narrative on file    Current Outpatient Prescriptions on File Prior to Visit  Medication Sig Dispense Refill  . acetaminophen (TYLENOL ARTHRITIS PAIN) 650 MG CR tablet 2 tablets every 4 hours as needed       . aspirin 81 MG EC tablet 1/2 tablet daily      . B-D UF III MINI PEN NEEDLES 31G X 5 MM MISC CHECK BLOOD SUGAR 4 TIMES A DAY.  100 each  6  . Bimatoprost (LUMIGAN) 0.01 % SOLN One drop in the right eye at bedtime      . brimonidine (ALPHAGAN P) 0.1 % SOLN Place 1 drop into both eyes 2 (two) times daily.       . carvedilol (COREG) 12.5 MG tablet Take 12.5 mg by mouth 2 (two) times daily with a meal.       . Cholecalciferol (VITAMIN D3) 5000 UNITS CAPS  Take 1 capsule by mouth 2 (two) times daily.        Marland Kitchen diltiazem (CARDIZEM CD) 240 MG 24 hr capsule       . diltiazem (DILACOR XR) 240 MG 24 hr capsule Take 240 mg by mouth daily.        . dorzolamide-timolol (COSOPT) 22.3-6.8 MG/ML ophthalmic solution Place 1 drop into the right eye 2 (two) times daily.        Marland Kitchen econazole nitrate 1 % cream Apply topically 2 (two) times daily. Apply to feet as directed      . furosemide (LASIX) 40 MG tablet Take 40 mg by mouth 2 (two) times daily.       Marland Kitchen glucose blood (ONE TOUCH ULTRA TEST) test strip Use as directed to check blood sugar 6 times a day dx 250.43  200 each  5  . hydrALAZINE (APRESOLINE) 25 MG tablet Take 12.5 mg by mouth 2 (two) times daily.        . insulin detemir (LEVEMIR) 100 UNIT/ML injection Inject 65 Units into the  skin daily.      Marland Kitchen latanoprost (XALATAN) 0.005 % ophthalmic solution Place 1 drop into the right eye 2 (two) times daily.        Marland Kitchen lisinopril (PRINIVIL,ZESTRIL) 20 MG tablet Take 10 mg by mouth daily. 1/2 tablet by mouth daily      . magnesium gluconate (MAGONATE) 500 MG tablet Take 250 mg by mouth daily.      . Mirabegron ER (MYRBETRIQ) 25 MG TB24 1 injection every 3 weeks per pt      . Multiple Vitamin (MULTIVITAMIN) tablet Take 1 tablet by mouth daily.        . pravastatin (PRAVACHOL) 40 MG tablet Take 40 mg by mouth daily.        . psyllium (METAMUCIL) 58.6 % packet Take 1 packet by mouth 2 (two) times daily.          Allergies  Allergen Reactions  . Demerol   . Flomax (Tamsulosin Hcl)   . Morphine And Related     No family history on file.  BP 130/64  Pulse 56  Wt 207 lb (93.895 kg)  SpO2 98%  Review of Systems Denies LOC    Objective:   Physical Exam VITAL SIGNS:  See vs page.  GENERAL: no distress.  Pulses: dorsalis pedis intact bilat.   Feet: no deformity.  no ulcer on the feet.  feet are of normal color and temp.  no edema.  There is bilateral onychomycosis, and slight hyperpigmentation.   Neuro:  sensation is intact to touch on the feet.     Lab Results  Component Value Date   HGBA1C 7.9* 07/11/2012      Assessment & Plan:  DM: this is the best control this pt should aim for, given this regimen, which does match insulin to his changing needs throughout the day

## 2012-07-11 NOTE — Telephone Encounter (Signed)
The patient left message stating that he wanted Dr. Loanne Drilling to "call [facility] and tell them that he needs 6 medically necessary strips".  The patient may be reached at [phone] for clarification.  The patient has new insurance-[employer].

## 2012-08-09 ENCOUNTER — Other Ambulatory Visit: Payer: Self-pay

## 2012-08-09 MED ORDER — INSULIN DETEMIR 100 UNIT/ML ~~LOC~~ SOLN: 65.0000 [IU] | Freq: Every day | SUBCUTANEOUS | Status: DC

## 2012-11-08 ENCOUNTER — Other Ambulatory Visit: Payer: Self-pay | Admitting: Endocrinology

## 2012-11-08 NOTE — Telephone Encounter (Signed)
When is next [facility] appt due? Please advise / Sherri

## 2012-11-08 NOTE — Telephone Encounter (Signed)
F/u ov is due.

## 2012-11-08 NOTE — Telephone Encounter (Signed)
F/u ov is due

## 2012-11-19 ENCOUNTER — Encounter: Payer: Self-pay | Admitting: Endocrinology

## 2012-11-19 ENCOUNTER — Ambulatory Visit (INDEPENDENT_AMBULATORY_CARE_PROVIDER_SITE_OTHER): Payer: Medicare PPO | Admitting: Endocrinology

## 2012-11-19 VITALS — BP 124/80 | HR 78 | Ht 70.0 in | Wt 201.0 lb

## 2012-11-19 DIAGNOSIS — E1065 Type 1 diabetes mellitus with hyperglycemia: Secondary | ICD-10-CM

## 2012-11-19 DIAGNOSIS — E1029 Type 1 diabetes mellitus with other diabetic kidney complication: Secondary | ICD-10-CM

## 2012-11-19 NOTE — Patient Instructions (Addendum)
Please come back for a follow-up appointment in 3 months. check your blood sugar 6 times a day--before the 3 meals, and at bedtime.  also check if you have symptoms of your blood sugar being too high or too low.  please keep a record of the readings and bring it to your next appointment here.  please call us sooner if you are having low blood sugar episodes.   On this type of insulin schedule, you should eat meals on a regular schedule.  If a meal is missed or significantly delayed, your blood sugar could go low. To avoid the lows, carry some packets of cheese and crackers.  This way, you can avoid the lows.   A diabetes blood test is requested for you today.  We'll contact you with results.

## 2012-11-19 NOTE — Progress Notes (Signed)
Subjective:    Patient ID: Gregory Hatfield, male    DOB: 11-Jan-1926, 79 y.o.   MRN: [id number]  HPI Pt returns for f/u of insulin-requiring DM (dx'ed 1975; he has mild if any neuropathy of the lower extremities; she has associated renal insuff and CHF; therapy is limited by pt's need for a simple regimen).  pt states he feels well in general.  he brings a record of his cbg's which i have reviewed today.  It varies from 50-200's, but most are in the 100's.  There is no trend throughout the day, except he has mild hypoglycemia approx twice a week, usually before or after lunch.   Past Medical History  Diagnosis Date  . Diabetes mellitus   . CHF (congestive heart failure)   . GERD (gastroesophageal reflux disease)   . Hypertension   . Dyslipidemia   . Glaucoma   . CVA (cerebral infarction)     Left Eye  . Diabetic retinopathy     No past surgical history on file.  History   Social History  . Marital Status: Widowed    Spouse Name: N/A    Number of Children: N/A  . Years of Education: N/A   Occupational History  . Not on file.   Social History Main Topics  . Smoking status: Former Smoker    Quit date: 06/21/1983  . Smokeless tobacco: Not on file  . Alcohol Use: Not on file  . Drug Use: Not on file  . Sexually Active: Not on file   Other Topics Concern  . Not on file   Social History Narrative  . No narrative on file    Current Outpatient Prescriptions on File Prior to Visit  Medication Sig Dispense Refill  . acetaminophen (TYLENOL ARTHRITIS PAIN) 650 MG CR tablet 2 tablets every 4 hours as needed       . aspirin 81 MG EC tablet 1/2 tablet daily      . B-D UF III MINI PEN NEEDLES 31G X 5 MM MISC CHECK BLOOD SUGAR 4 TIMES A DAY.  100 each  6  . Bimatoprost (LUMIGAN) 0.01 % SOLN One drop in the right eye at bedtime      . brimonidine (ALPHAGAN P) 0.1 % SOLN Place 1 drop into both eyes 2 (two) times daily.       . carvedilol (COREG) 12.5 MG tablet Take 12.5 mg by mouth  2 (two) times daily with a meal.       . Cholecalciferol (VITAMIN D3) 5000 UNITS CAPS Take 1 capsule by mouth 2 (two) times daily.        Marland Kitchen diltiazem (CARDIZEM CD) 240 MG 24 hr capsule       . diltiazem (DILACOR XR) 240 MG 24 hr capsule Take 240 mg by mouth daily.        . dorzolamide-timolol (COSOPT) 22.3-6.8 MG/ML ophthalmic solution Place 1 drop into the right eye 2 (two) times daily.        Marland Kitchen econazole nitrate 1 % cream Apply topically 2 (two) times daily. Apply to feet as directed      . furosemide (LASIX) 40 MG tablet Take 40 mg by mouth 2 (two) times daily.       Marland Kitchen glucose blood (ONE TOUCH ULTRA TEST) test strip Use as directed to check blood sugar 6 times a day dx 250.43  200 each  5  . hydrALAZINE (APRESOLINE) 25 MG tablet Take 12.5 mg by mouth 2 (two) times daily.        Marland Kitchen  latanoprost (XALATAN) 0.005 % ophthalmic solution Place 1 drop into the right eye 2 (two) times daily.        Marland Kitchen lisinopril (PRINIVIL,ZESTRIL) 20 MG tablet Take 10 mg by mouth daily. 1/2 tablet by mouth daily      . magnesium gluconate (MAGONATE) 500 MG tablet Take 250 mg by mouth daily.      . Mirabegron ER (MYRBETRIQ) 25 MG TB24 1 injection every 3 weeks per pt      . Multiple Vitamin (MULTIVITAMIN) tablet Take 1 tablet by mouth daily.        . pravastatin (PRAVACHOL) 40 MG tablet Take 40 mg by mouth daily.        . psyllium (METAMUCIL) 58.6 % packet Take 1 packet by mouth 2 (two) times daily.         No current facility-administered medications on file prior to visit.    Allergies  Allergen Reactions  . Demerol   . Flomax (Tamsulosin Hcl)   . Morphine And Related    No family history on file.  BP 124/80  Pulse 78  Ht 5\' 10"  (1.778 m)  Wt 201 lb (91.173 kg)  BMI 28.84 kg/m2  SpO2 98%  Review of Systems Denies LOC and weight change    Objective:   Physical Exam VITAL SIGNS:  See vs page GENERAL: no distress  Lab Results  Component Value Date   HGBA1C 8.1* 11/19/2012      Assessment & Plan:   This insulin regimen was chosen from multiple options, for its simplicity.  The benefits of glycemic control must be weighed against the risks of hypoglycemia.  The next step is for him to avoid midday hypoglycemia, by eating lunch on time.

## 2012-12-07 NOTE — Telephone Encounter (Signed)
Called pt and told him that his next follow up appt is scheduled in Sept.

## 2013-02-14 ENCOUNTER — Other Ambulatory Visit: Payer: Self-pay | Admitting: *Deleted

## 2013-02-14 MED ORDER — INSULIN PEN NEEDLE 31G X 5 MM MISC: Status: DC

## 2013-02-19 ENCOUNTER — Encounter: Payer: Self-pay | Admitting: Endocrinology

## 2013-02-19 ENCOUNTER — Ambulatory Visit (INDEPENDENT_AMBULATORY_CARE_PROVIDER_SITE_OTHER): Payer: Medicare PPO | Admitting: Endocrinology

## 2013-02-19 VITALS — BP 130/72 | Ht 67.0 in | Wt 199.0 lb

## 2013-02-19 DIAGNOSIS — E1029 Type 1 diabetes mellitus with other diabetic kidney complication: Secondary | ICD-10-CM

## 2013-02-19 NOTE — Progress Notes (Signed)
Subjective:    Patient ID: Gregory Hatfield, male    DOB: 17-Aug-1925, 79 y.o.   MRN: [id number]  HPI Pt returns for f/u of insulin-requiring DM (dx'ed 1975; he has mild if any neuropathy of the lower extremities; she has associated renal insuff and CHF; therapy is limited by pt's need for a simple regimen).  pt states he feels well in general.  no cbg record, but states cbg's are vary from 87-200's, but most are in the 100's.  There is no trend throughout the day.   Past Medical History  Diagnosis Date  . Diabetes mellitus   . CHF (congestive heart failure)   . GERD (gastroesophageal reflux disease)   . Hypertension   . Dyslipidemia   . Glaucoma   . CVA (cerebral infarction)     Left Eye  . Diabetic retinopathy     No past surgical history on file.  History   Social History  . Marital Status: Widowed    Spouse Name: N/A    Number of Children: N/A  . Years of Education: N/A   Occupational History  . Not on file.   Social History Main Topics  . Smoking status: Former Smoker    Quit date: 06/21/1983  . Smokeless tobacco: Not on file  . Alcohol Use: Not on file  . Drug Use: Not on file  . Sexual Activity: Not on file   Other Topics Concern  . Not on file   Social History Narrative  . No narrative on file    Current Outpatient Prescriptions on File Prior to Visit  Medication Sig Dispense Refill  . acetaminophen (TYLENOL ARTHRITIS PAIN) 650 MG CR tablet 2 tablets every 4 hours as needed       . aspirin 81 MG EC tablet 1/2 tablet daily      . Bimatoprost (LUMIGAN) 0.01 % SOLN One drop in the right eye at bedtime      . brimonidine (ALPHAGAN P) 0.1 % SOLN Place 1 drop into both eyes 2 (two) times daily.       . carvedilol (COREG) 12.5 MG tablet Take 12.5 mg by mouth 2 (two) times daily with a meal.       . Cholecalciferol (VITAMIN D3) 5000 UNITS CAPS Take 1 capsule by mouth 2 (two) times daily.        Marland Kitchen diltiazem (CARDIZEM CD) 240 MG 24 hr capsule       . diltiazem  (DILACOR XR) 240 MG 24 hr capsule Take 240 mg by mouth daily.        . dorzolamide-timolol (COSOPT) 22.3-6.8 MG/ML ophthalmic solution Place 1 drop into the right eye 2 (two) times daily.        Marland Kitchen econazole nitrate 1 % cream Apply topically 2 (two) times daily. Apply to feet as directed      . furosemide (LASIX) 40 MG tablet Take 40 mg by mouth 2 (two) times daily.       Marland Kitchen glucose blood (ONE TOUCH ULTRA TEST) test strip Use as directed to check blood sugar 6 times a day dx 250.43  200 each  5  . hydrALAZINE (APRESOLINE) 25 MG tablet Take 12.5 mg by mouth 2 (two) times daily.        . Insulin Detemir (LEVEMIR FLEXPEN [place]) Inject 65 Units into the skin every morning. And pen needles 1/day      . Insulin Pen Needle (B-D UF III MINI PEN NEEDLES) 31G X 5 MM MISC CHECK BLOOD  SUGAR 4 TIMES DAILY  100 each  4  . latanoprost (XALATAN) 0.005 % ophthalmic solution Place 1 drop into the right eye 2 (two) times daily.        Marland Kitchen lisinopril (PRINIVIL,ZESTRIL) 20 MG tablet Take 10 mg by mouth daily. 1/2 tablet by mouth daily      . magnesium gluconate (MAGONATE) 500 MG tablet Take 250 mg by mouth daily.      . Mirabegron ER (MYRBETRIQ) 25 MG TB24 1 injection every 3 weeks per pt      . Multiple Vitamin (MULTIVITAMIN) tablet Take 1 tablet by mouth daily.        . pravastatin (PRAVACHOL) 40 MG tablet Take 40 mg by mouth daily.        . psyllium (METAMUCIL) 58.6 % packet Take 1 packet by mouth 2 (two) times daily.         No current facility-administered medications on file prior to visit.   Allergies  Allergen Reactions  . Demerol   . Flomax [Tamsulosin Hcl]   . Morphine And Related    No family history on file.  BP 130/72  Ht 5\' 7"  (1.702 m)  Wt 199 lb (90.266 kg)  BMI 31.16 kg/m2  SpO2 97%  Review of Systems denies hypoglycemia.  He lost a few lbs.    Objective:   Physical Exam VITAL SIGNS:  See vs page GENERAL: no distress SKIN:  Insulin injection sites at the anterior abdomen are  normal.  Lab Results  Component Value Date   HGBA1C 7.8* 02/19/2013      Assessment & Plan:  This insulin regimen was chosen from multiple options, for its simplicity.  The benefits of glycemic control must be weighed against the risks of hypoglycemia.  CHF.  This, especially in the context of his advance age, limits the exercise rx of his DM

## 2013-02-19 NOTE — Patient Instructions (Addendum)
Please come back for a follow-up appointment in 3 months. check your blood sugar 6 times a day--before the 3 meals, and at bedtime.  also check if you have symptoms of your blood sugar being too high or too low.  please keep a record of the readings and bring it to your next appointment here.  please call us sooner if you are having low blood sugar episodes.   On this type of insulin schedule, you should eat meals on a regular schedule.  If a meal is missed or significantly delayed, your blood sugar could go low. To avoid the lows, carry some packets of cheese and crackers.  This way, you can avoid the lows.   A diabetes blood test is requested for you today.  We'll contact you with results.

## 2013-03-01 ENCOUNTER — Encounter: Payer: Self-pay | Admitting: *Deleted

## 2013-03-07 ENCOUNTER — Ambulatory Visit (INDEPENDENT_AMBULATORY_CARE_PROVIDER_SITE_OTHER): Payer: Medicare PPO | Admitting: Internal Medicine

## 2013-03-07 ENCOUNTER — Encounter: Payer: Self-pay | Admitting: Internal Medicine

## 2013-03-07 VITALS — BP 118/70 | HR 59 | Ht 74.0 in | Wt 198.2 lb

## 2013-03-07 DIAGNOSIS — I10 Essential (primary) hypertension: Secondary | ICD-10-CM

## 2013-03-07 DIAGNOSIS — E1029 Type 1 diabetes mellitus with other diabetic kidney complication: Secondary | ICD-10-CM

## 2013-03-07 DIAGNOSIS — E7800 Pure hypercholesterolemia, unspecified: Secondary | ICD-10-CM

## 2013-03-07 NOTE — Patient Instructions (Addendum)
Your physician wants you to follow-up in: 1 year. You will receive a reminder letter in the mail two months in advance. If you don't receive a letter, please call our office to schedule the follow-up appointment.  Call us if you decide to have surgery.

## 2013-03-07 NOTE — Progress Notes (Signed)
OFFICE NOTE  Chief Complaint:  Routine followup  Primary Care Physician: Alesia Richards, MD  HPI:  Gregory Hatfield is an 79 year old gentleman who I saw last November of 2012 with a history of hypertension, chronic kidney disease, insulin-dependent diabetes, and dyslipidemia. He has continued to describe no anginal symptoms or worsening shortness of breath. He does have a history of cardiac catheterization in 2005 which was normal, and his main complaints today are left shoulder pain as well as some leg or hip pain. Denies any chest pain, shortness of breath, palpitations, presyncope or syncopal symptoms. He recently reports problems with his hips and says that he may need to have hip replacement. He occasionally has some lower extremity swelling denies any chest pain or worsening shortness of breath.  PMHx:  Past Medical History  Diagnosis Date  . Diabetes mellitus     insulin-dependent  . CHF (congestive heart failure)   . GERD (gastroesophageal reflux disease)   . Hypertension   . Dyslipidemia   . Glaucoma   . CVA (cerebral infarction)     Left Eye  . Diabetic retinopathy   . CKD (chronic kidney disease)   . BPH (benign prostatic hyperplasia)   . History of nuclear stress test 03/2004    inf wall scar from base to apex & anterolateral wall ischemia    Past Surgical History  Procedure Laterality Date  . Cardiac catheterization  04/2004    normal r-sided pressure, mild pulm HTN  . Prostate surgery  10/2010    transurethral resection   . Transthoracic echocardiogram  05/2006    EF 60-70%; mild calcif of MV; mild MV regurg; LA mildly dilated    FAMHx:  Family History  Problem Relation Age of Onset  . Diabetes Mother   . Hypertension Mother   . Prostate cancer Father   . Heart disease Father   . Diabetes Brother   . Cancer Brother   . Heart disease Brother   . Diabetes Sister   . Heart disease Sister   . Hyperlipidemia Child   . Hypertension Child   .  Diabetes Child   . Heart attack Child   . Cancer Child     SOCHx:   reports that he quit smoking about 29 years ago. He has never used smokeless tobacco. He reports that  drinks alcohol. His drug history is not on file.  ALLERGIES:  Allergies  Allergen Reactions  . Demerol   . Flomax [Tamsulosin Hcl]   . Morphine And Related   . Oxytrol [Oxybutynin] Rash    ROS: A comprehensive review of systems was negative except for: Musculoskeletal: positive for myalgias and stiff joints  HOME MEDS: Current Outpatient Prescriptions  Medication Sig Dispense Refill  . acetaminophen (TYLENOL ARTHRITIS PAIN) 650 MG CR tablet 2 tablets every 4 hours as needed       . aspirin 81 MG EC tablet 1/2 tablet daily      . carvedilol (COREG) 12.5 MG tablet Take 12.5 mg by mouth 2 (two) times daily with a meal.       . Cholecalciferol (VITAMIN D3) 5000 UNITS CAPS Take 1 capsule by mouth 2 (two) times daily.        Marland Kitchen diltiazem (DILACOR XR) 240 MG 24 hr capsule Take 240 mg by mouth daily.        Marland Kitchen econazole nitrate 1 % cream Apply topically 2 (two) times daily. Apply to feet as directed      . furosemide (LASIX)  40 MG tablet Take 40 mg by mouth 2 (two) times daily.       Marland Kitchen glucose blood (ONE TOUCH ULTRA TEST) test strip Use as directed to check blood sugar 6 times a day dx 250.43  200 each  5  . hydrALAZINE (APRESOLINE) 25 MG tablet Take 12.5 mg by mouth 2 (two) times daily.        . Insulin Detemir (LEVEMIR FLEXPEN [place]) Inject 65 Units into the skin every morning. And pen needles 1/day      . Insulin Pen Needle (B-D UF III MINI PEN NEEDLES) 31G X 5 MM MISC CHECK BLOOD SUGAR 4 TIMES DAILY  100 each  4  . latanoprost (XALATAN) 0.005 % ophthalmic solution Place 1 drop into the right eye 2 (two) times daily.        Marland Kitchen lisinopril (PRINIVIL,ZESTRIL) 20 MG tablet Take 10 mg by mouth daily. 1/2 tablet by mouth daily      . magnesium gluconate (MAGONATE) 500 MG tablet Take 250 mg by mouth daily.      . Mirabegron ER  (MYRBETRIQ) 25 MG TB24 1 injection every 3 weeks per pt      . Multiple Vitamin (MULTIVITAMIN) tablet Take 1 tablet by mouth daily.        . pravastatin (PRAVACHOL) 40 MG tablet Take 40 mg by mouth daily.        . psyllium (METAMUCIL) 58.6 % packet Take 1 packet by mouth 2 (two) times daily.        Marland Kitchen SIMBRINZA 1-0.2 % SUSP Place 1 drop into both eyes 3 (three) times daily.      Marland Kitchen diltiazem (CARDIZEM CD) 240 MG 24 hr capsule        No current facility-administered medications for this visit.    LABS/IMAGING: No results found for this or any previous visit (from the past 48 hour(s)). No results found.  VITALS: BP 118/70  Pulse 59  Ht 6\' 2"  (1.88 m)  Wt 198 lb 3.2 oz (89.903 kg)  BMI 25.44 kg/m2  EXAM: General appearance: alert and no distress Neck: no adenopathy, no carotid bruit, no JVD, supple, symmetrical, trachea midline and thyroid not enlarged, symmetric, no tenderness/mass/nodules Lungs: clear to auscultation bilaterally Heart: regular rate and rhythm, S1, S2 normal, no murmur, click, rub or gallop Abdomen: soft, non-tender; bowel sounds normal; no masses,  no organomegaly Extremities: extremities normal, atraumatic, no cyanosis or edema Pulses: 2+ and symmetric Skin: Skin color, texture, turgor normal. No rashes or lesions Neurologic: Grossly normal  EKG: Sinus bradycardia 59  ASSESSMENT: 1. Type 1 diabetes 2. Hypertension 3. Chronic kidney disease 4. Dyslipidemia  PLAN: 1.   Mr. Gregory Hatfield has no cardiac complaints at this time other than problems with his hip. He is contemplating surgery for this if it has not improved. I would be happy to see him for risk stratification prior to that as necessary, otherwise I have no further suggestions in his medications at this time. His cholesterol is being followed by his primary care doctor and he has an endocrinologist. He can followup with [place] as needed.  Pixie Casino, MD, [facility] Attending Cardiologist [facility] 03/07/2013, 3:45 PM

## 2013-03-14 ENCOUNTER — Telehealth: Payer: Self-pay | Admitting: Endocrinology

## 2013-03-14 NOTE — Telephone Encounter (Signed)
PT WANTS TO KNOW IF HIS PEN HAS CHANGED, APPARENTLY PACKAGING DOESN'T LOOK THE SAME / [facility]

## 2013-04-25 ENCOUNTER — Telehealth: Payer: Self-pay | Admitting: Internal Medicine

## 2013-04-25 ENCOUNTER — Other Ambulatory Visit: Payer: Self-pay | Admitting: Endocrinology

## 2013-04-25 DIAGNOSIS — I10 Essential (primary) hypertension: Secondary | ICD-10-CM

## 2013-04-25 MED ORDER — DILTIAZEM HCL ER 240 MG PO CP24: 240.0000 mg | ORAL_CAPSULE | Freq: Every day | ORAL | Status: DC

## 2013-04-25 NOTE — Telephone Encounter (Signed)
Fax refill for diltiazem 24hr cd 240 (c) qty [place] [address] tele# [phone]

## 2013-05-06 ENCOUNTER — Encounter: Payer: Self-pay | Admitting: Podiatry

## 2013-05-06 ENCOUNTER — Ambulatory Visit (INDEPENDENT_AMBULATORY_CARE_PROVIDER_SITE_OTHER): Payer: Medicare PPO | Admitting: Podiatry

## 2013-05-06 VITALS — BP 160/90 | HR 60 | Resp 16 | Ht 72.0 in | Wt 199.0 lb

## 2013-05-06 DIAGNOSIS — B351 Tinea unguium: Secondary | ICD-10-CM

## 2013-05-06 DIAGNOSIS — M79609 Pain in unspecified limb: Secondary | ICD-10-CM

## 2013-05-07 ENCOUNTER — Telehealth: Payer: Self-pay | Admitting: *Deleted

## 2013-05-07 DIAGNOSIS — B351 Tinea unguium: Secondary | ICD-10-CM

## 2013-05-07 NOTE — Telephone Encounter (Signed)
[facility] FOR PATIENT

## 2013-05-07 NOTE — Telephone Encounter (Signed)
Pt states in office yesterday and has a question for the [employer] staff.

## 2013-05-08 NOTE — Progress Notes (Signed)
Subjective:     Patient ID: Gregory Hatfield, male   DOB: March 03, 1926, 79 y.o.   MRN: [id number]  HPI patient states I'm having pain in my nailbeds 1-5 both feet and I cannot cut them myself   Review of Systems     Objective:   Physical Exam Neurovascular status unchanged with thick mycotic nail in infections 1-5 both feet    Assessment:     Mycotic nails with pain 1-5 both feet    Plan:     Debridement painful nail bed 1-5 both feet with no iatrogenic bleeding noted

## 2013-05-21 ENCOUNTER — Ambulatory Visit (INDEPENDENT_AMBULATORY_CARE_PROVIDER_SITE_OTHER): Payer: Medicare PPO | Admitting: Endocrinology

## 2013-05-21 ENCOUNTER — Encounter: Payer: Self-pay | Admitting: Endocrinology

## 2013-05-21 VITALS — BP 126/70 | HR 57 | Temp 97.4°F

## 2013-05-21 DIAGNOSIS — E1029 Type 1 diabetes mellitus with other diabetic kidney complication: Secondary | ICD-10-CM

## 2013-05-21 LAB — MICROALBUMIN / CREATININE URINE RATIO: Microalb, Ur: 2.3 mg/dL — ABNORMAL HIGH (ref 0.0–1.9)

## 2013-05-21 LAB — HEMOGLOBIN A1C: Hgb A1c MFr Bld: 8.5 % — ABNORMAL HIGH (ref 4.6–6.5)

## 2013-05-21 NOTE — Patient Instructions (Addendum)
Please come back for a follow-up appointment in 3 months. check your blood sugar 6 times a day--before the 3 meals, and at bedtime.  also check if you have symptoms of your blood sugar being too high or too low.  please keep a record of the readings and bring it to your next appointment here.  please call us sooner if you are having low blood sugar episodes.   On this type of insulin schedule, you should eat meals on a regular schedule.  If a meal is missed or significantly delayed, your blood sugar could go low. To avoid the lows, carry some packets of cheese and crackers.  This way, you can avoid the lows.   A diabetes blood test is requested for you today.  We'll contact you with results.    Please reduce the insulin to 60 units each morning.

## 2013-05-21 NOTE — Progress Notes (Signed)
Subjective:    Patient ID: Gregory Hatfield, male    DOB: 1926/01/04, 79 y.o.   MRN: [id number]  HPI Pt returns for f/u of insulin-requiring DM (dx'ed 1975, when he presented with dizziness; he has mild if any neuropathy of the lower extremities; he has associated renal insuff and CHF; he has been on insulin since 2003; therapy has been limited by pt's need for a simple regimen; he has never had severe hypoglycemia or DKA).  Last month, pt had an episode of hypoglycemia in the evening, while driving.  He was able to pull over, though.  This happend after he missed a meal. he brings a record of his cbg's which i have reviewed today.  It varies from 67-200, but most are in the 100's.  There is no trend throughout the day.  Past Medical History  Diagnosis Date  . Diabetes mellitus     insulin-dependent  . CHF (congestive heart failure)   . GERD (gastroesophageal reflux disease)   . Hypertension   . Dyslipidemia   . Glaucoma   . CVA (cerebral infarction)     Left Eye  . Diabetic retinopathy   . CKD (chronic kidney disease)   . BPH (benign prostatic hyperplasia)   . History of nuclear stress test 03/2004    inf wall scar from base to apex & anterolateral wall ischemia    Past Surgical History  Procedure Laterality Date  . Cardiac catheterization  04/2004    normal r-sided pressure, mild pulm HTN  . Prostate surgery  10/2010    transurethral resection   . Transthoracic echocardiogram  05/2006    EF 60-70%; mild calcif of MV; mild MV regurg; LA mildly dilated    History   Social History  . Marital Status: Widowed    Spouse Name: N/A    Number of Children: 32  . Years of Education: master's   Occupational History  . teacher/instructor    Social History Main Topics  . Smoking status: Former Smoker    Quit date: 06/21/1983  . Smokeless tobacco: Never Used  . Alcohol Use: Yes     Comment: wine every now and then  . Drug Use: Not on file  . Sexual Activity: Not on file    Other Topics Concern  . Not on file   Social History Narrative  . No narrative on file    Current Outpatient Prescriptions on File Prior to Visit  Medication Sig Dispense Refill  . acetaminophen (TYLENOL ARTHRITIS PAIN) 650 MG CR tablet 2 tablets every 4 hours as needed       . aspirin 81 MG EC tablet 1/2 tablet daily      . carvedilol (COREG) 12.5 MG tablet Take 12.5 mg by mouth 2 (two) times daily with a meal.       . Cholecalciferol (VITAMIN D3) 5000 UNITS CAPS Take 1 capsule by mouth 2 (two) times daily.        Marland Kitchen diltiazem (CARDIZEM CD) 240 MG 24 hr capsule       . diltiazem (DILACOR XR) 240 MG 24 hr capsule Take 1 capsule (240 mg total) by mouth daily.  30 capsule  3  . econazole nitrate 1 % cream Apply topically 2 (two) times daily. Apply to feet as directed      . furosemide (LASIX) 40 MG tablet Take 40 mg by mouth 2 (two) times daily.       Marland Kitchen glucose blood (ONE TOUCH ULTRA TEST) test strip  Use as directed to check blood sugar 6 times a day dx 250.43  200 each  5  . hydrALAZINE (APRESOLINE) 25 MG tablet Take 12.5 mg by mouth 2 (two) times daily.        . Insulin Detemir (LEVEMIR FLEXPEN [place]) Inject 60 Units into the skin every morning. And pen needles 1/day      . Insulin Pen Needle (B-D UF III MINI PEN NEEDLES) 31G X 5 MM MISC CHECK BLOOD SUGAR 4 TIMES DAILY  100 each  4  . latanoprost (XALATAN) 0.005 % ophthalmic solution Place 1 drop into the right eye 2 (two) times daily.        Marland Kitchen LEVEMIR FLEXTOUCH 100 UNIT/ML SOPN INJECT 65 UNITS INTO THE SKIN DAILY AS DIRECTED.  15 mL  5  . lisinopril (PRINIVIL,ZESTRIL) 20 MG tablet Take 10 mg by mouth daily. 1/2 tablet by mouth daily      . magnesium gluconate (MAGONATE) 500 MG tablet Take 250 mg by mouth daily.      . Mirabegron ER (MYRBETRIQ) 25 MG TB24 1 injection every 3 weeks per pt      . Multiple Vitamin (MULTIVITAMIN) tablet Take 1 tablet by mouth daily.        . pravastatin (PRAVACHOL) 40 MG tablet Take 40 mg by mouth daily.         . psyllium (METAMUCIL) 58.6 % packet Take 1 packet by mouth 2 (two) times daily.        Marland Kitchen SIMBRINZA 1-0.2 % SUSP Place 1 drop into both eyes 3 (three) times daily.       No current facility-administered medications on file prior to visit.    Allergies  Allergen Reactions  . Demerol   . Flomax [Tamsulosin Hcl]   . Morphine And Related   . Oxytrol [Oxybutynin] Rash    Family History  Problem Relation Age of Onset  . Diabetes Mother   . Hypertension Mother   . Prostate cancer Father   . Heart disease Father   . Diabetes Brother   . Cancer Brother   . Heart disease Brother   . Diabetes Sister   . Heart disease Sister   . Hyperlipidemia Child   . Hypertension Child   . Diabetes Child   . Heart attack Child   . Cancer Child     BP 126/70  Pulse 57  Temp(Src) 97.4 F (36.3 C) (Oral)  SpO2 99%  Review of Systems Denies LOC and weight change.      Objective:   Physical Exam VITAL SIGNS:  See vs page GENERAL: no distress.   Lab Results  Component Value Date   HGBA1C 8.5* 05/21/2013      Assessment & Plan:  This insulin regimen was chosen from multiple options, for its simplicity.  The benefits of glycemic control must be weighed against the risks of hypoglycemia.  Therefore, despite the increased a1c, we need to slightly decrease the insulin for safety.   CHF.  This, especially in the context of his advanced age, limits the exercise rx of his DM.

## 2013-06-11 ENCOUNTER — Encounter: Payer: Self-pay | Admitting: Internal Medicine

## 2013-06-16 DIAGNOSIS — F0280 Dementia in other diseases classified elsewhere without behavioral disturbance: Secondary | ICD-10-CM | POA: Insufficient documentation

## 2013-06-16 HISTORY — DX: Dementia in other diseases classified elsewhere, unspecified severity, without behavioral disturbance, psychotic disturbance, mood disturbance, and anxiety: F02.80

## 2013-06-16 NOTE — Progress Notes (Signed)
Patient ID: Gregory Hatfield, male   DOB: 03/08/1926, 79 y.o.   MRN: [id number]   This very nice 79 y.o. [name] presents for 3 month follow up with Hypertension, Hyperlipidemia, T1 IDDM and Vitamin D Deficiency.    Patient's HTN predates since   . BP has been controlled at home. Today's BP is   . Patient had a negative cardiac cath in 2005. Patient denies any cardiac type chest pain, palpitations, dyspnea/orthopnea/PND, dizziness, claudication, or dependent edema.   Hyperlipidemia is controlled with diet & meds. Last Cholesterol was  , Triglycerides were    , HDL   , and LDL    . Patient denies myalgias or other med SE's.    Also, the patient has history of T1 IDDM predating to diagnosis of DM in 1975 with transition to insulin in about 2003 and patient is  followed closely by Dr Loanne Drilling with diabetic control compromised by patient's limited intellectual capacity  with last A1c recently of 8.4%. Patient denies any diabetic type polys, paresthesias, or visual blurring, but has Hx/o episodes of rescued reactive hypoglycemia.     Further, Patient has history of Vitamin D Deficiency with last vitamin D of   . Patient supplements vitamin D without any suspected side-effects.  Current Outpatient Prescriptions on File Prior to Visit  Medication Sig Dispense Refill  . acetaminophen (TYLENOL ARTHRITIS PAIN) 650 MG CR tablet 2 tablets every 4 hours as needed       . aspirin 81 MG EC tablet 1/2 tablet daily      . carvedilol (COREG) 12.5 MG tablet Take 12.5 mg by mouth 2 (two) times daily with a meal.       . Cholecalciferol (VITAMIN D3) 5000 UNITS CAPS Take 1 capsule by mouth 2 (two) times daily.        Marland Kitchen diltiazem (CARDIZEM CD) 240 MG 24 hr capsule       . diltiazem (DILACOR XR) 240 MG 24 hr capsule Take 1 capsule (240 mg total) by mouth daily.  30 capsule  3  . econazole nitrate 1 % cream Apply topically 2 (two) times daily. Apply to feet as directed      . furosemide (LASIX) 40 MG tablet Take 40 mg by  mouth 2 (two) times daily.       Marland Kitchen glucose blood (ONE TOUCH ULTRA TEST) test strip Use as directed to check blood sugar 6 times a day dx 250.43  200 each  5  . hydrALAZINE (APRESOLINE) 25 MG tablet Take 12.5 mg by mouth 2 (two) times daily.        . Insulin Detemir (LEVEMIR FLEXPEN [place]) Inject 60 Units into the skin every morning. And pen needles 1/day      . Insulin Pen Needle (B-D UF III MINI PEN NEEDLES) 31G X 5 MM MISC CHECK BLOOD SUGAR 4 TIMES DAILY  100 each  4  . latanoprost (XALATAN) 0.005 % ophthalmic solution Place 1 drop into the right eye 2 (two) times daily.        Marland Kitchen LEVEMIR FLEXTOUCH 100 UNIT/ML SOPN INJECT 65 UNITS INTO THE SKIN DAILY AS DIRECTED.  15 mL  5  . lisinopril (PRINIVIL,ZESTRIL) 20 MG tablet Take 10 mg by mouth daily. 1/2 tablet by mouth daily      . magnesium gluconate (MAGONATE) 500 MG tablet Take 250 mg by mouth daily.      . Mirabegron ER (MYRBETRIQ) 25 MG TB24 1 injection every 3 weeks per pt      .  Multiple Vitamin (MULTIVITAMIN) tablet Take 1 tablet by mouth daily.        . pravastatin (PRAVACHOL) 40 MG tablet Take 40 mg by mouth daily.        . psyllium (METAMUCIL) 58.6 % packet Take 1 packet by mouth 2 (two) times daily.        Marland Kitchen SIMBRINZA 1-0.2 % SUSP Place 1 drop into both eyes 3 (three) times daily.       No current facility-administered medications on file prior to visit.     Allergies  Allergen Reactions  . Ace Inhibitors   . Demerol   . Flomax [Tamsulosin Hcl]   . Morphine And Related   . Oxytrol [Oxybutynin] Rash    PMHx:   Past Medical History  Diagnosis Date  . Diabetes mellitus     insulin-dependent  . CHF (congestive heart failure)   . GERD (gastroesophageal reflux disease)   . Hypertension   . Dyslipidemia   . Glaucoma   . CVA (cerebral infarction)     Left Eye  . Diabetic retinopathy   . CKD (chronic kidney disease)   . BPH (benign prostatic hyperplasia)   . History of nuclear stress test 03/2004    inf wall scar from base to  apex & anterolateral wall ischemia  . Hyperlipidemia   . Vitamin D deficiency     FHx:    Reviewed / unchanged  SHx:    Reviewed / unchanged  Systems Review: Constitutional: Denies fever, chills, wt changes, headaches, insomnia, fatigue, night sweats, change in appetite. Eyes: Denies redness, blurred vision, diplopia, discharge, itchy, watery eyes.  ENT: Denies discharge, congestion, post nasal drip, epistaxis, sore throat, earache, hearing loss, dental pain, tinnitus, vertigo, sinus pain, snoring.  CV: Denies chest pain, palpitations, irregular heartbeat, syncope, dyspnea, diaphoresis, orthopnea, PND, claudication, edema. Respiratory: denies cough, dyspnea, DOE, pleurisy, hoarseness, laryngitis, wheezing.  Gastrointestinal: Denies dysphagia, odynophagia, heartburn, reflux, water brash, abdominal pain or cramps, nausea, vomiting, bloating, diarrhea, constipation, hematemesis, melena, hematochezia,  or hemorrhoids. Genitourinary: Denies dysuria, frequency, urgency, nocturia, hesitancy, discharge, hematuria, flank pain. Musculoskeletal: Denies arthralgias, myalgias, stiffness, jt. swelling, pain, limp, strain/sprain.  Skin: Denies pruritus, rash, hives, warts, acne, eczema, change in skin lesion(s). Neuro: No weakness, tremor, incoordination, spasms, paresthesia, or pain. Psychiatric: Denies confusion, memory loss, or sensory loss. Endo: Denies change in weight, skin, hair change.  Heme/Lymph: No excessive bleeding, bruising, orenlarged lymph nodes.  There were no vitals filed for this visit.  Estimated body mass index is 26.98 kg/(m^2) as calculated from the following:   Height as of 05/06/13: 6' (1.829 m).   Weight as of 05/06/13: 199 lb (90.266 kg).  On Exam: Appears well nourished - in no distress. Eyes: PERRLA, EOMs, conjunctiva no swelling or erythema. Sinuses: No frontal/maxillary tenderness ENT/Mouth: EAC's clear, TM's nl w/o erythema, bulging. Nares clear w/o erythema,  swelling, exudates. Oropharynx clear without erythema or exudates. Oral hygiene is good. Tongue normal, non obstructing. Hearing intact.  Neck: Supple. Thyroid nl. Car 2+/2+ without bruits, nodes or JVD. Chest: Respirations nl with BS clear & equal w/o rales, rhonchi, wheezing or stridor.  Cor: Heart sounds normal w/ regular rate and rhythm without sig. murmurs, gallops, clicks, or rubs. Peripheral pulses normal and equal  without edema.  Abdomen: Soft & bowel sounds normal. Non-tender w/o guarding, rebound, hernias, masses, or organomegaly.  Lymphatics: Unremarkable.  Musculoskeletal: Full ROM all peripheral extremities, joint stability, 5/5 strength, and normal gait.  Skin: Warm, dry without exposed rashes, lesions, ecchymosis  apparent.  Neuro: Cranial nerves intact, reflexes equal bilaterally. Sensory-motor testing grossly intact. Tendon reflexes grossly intact.  Pysch: Alert & oriented x 3. Insight concrete and judgement thus limited. No ideations.  Assessment and Plan:  1. Hypertension - Continue monitor blood pressure at home. Continue diet/meds same.  2. Hyperlipidemia - Continue diet/meds, exercise,& lifestyle modifications. Continue monitor periodic cholesterol/liver & renal functions   3. 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  4. Vitamin D Deficiency - Continue supplementation.  Recommended regular exercise, BP monitoring, weight control, and discussed med and SE's. Recommended labs to assess and monitor clinical status. Further disposition pending results of labs.

## 2013-06-17 ENCOUNTER — Ambulatory Visit: Payer: Medicare PPO | Admitting: Internal Medicine

## 2013-06-18 ENCOUNTER — Ambulatory Visit (INDEPENDENT_AMBULATORY_CARE_PROVIDER_SITE_OTHER): Payer: Medicare PPO | Admitting: Internal Medicine

## 2013-06-18 ENCOUNTER — Encounter: Payer: Self-pay | Admitting: Internal Medicine

## 2013-06-18 VITALS — BP 128/68 | HR 60 | Temp 97.7°F | Resp 16 | Wt 201.6 lb

## 2013-06-18 DIAGNOSIS — Z79899 Other long term (current) drug therapy: Secondary | ICD-10-CM

## 2013-06-18 DIAGNOSIS — E559 Vitamin D deficiency, unspecified: Secondary | ICD-10-CM

## 2013-06-18 DIAGNOSIS — E782 Mixed hyperlipidemia: Secondary | ICD-10-CM

## 2013-06-18 DIAGNOSIS — I10 Essential (primary) hypertension: Secondary | ICD-10-CM

## 2013-06-18 DIAGNOSIS — E1029 Type 1 diabetes mellitus with other diabetic kidney complication: Secondary | ICD-10-CM

## 2013-06-18 LAB — HEPATIC FUNCTION PANEL
ALT: 22 U/L (ref 0–53)
AST: 23 U/L (ref 0–37)
Albumin: 4 g/dL (ref 3.5–5.2)
Total Protein: 7 g/dL (ref 6.0–8.3)

## 2013-06-18 LAB — CBC WITH DIFFERENTIAL/PLATELET
Basophils Absolute: 0 10*3/uL (ref 0.0–0.1)
Eosinophils Relative: 2 % (ref 0–5)
HCT: 37.8 % — ABNORMAL LOW (ref 39.0–52.0)
Hemoglobin: 12.8 g/dL — ABNORMAL LOW (ref 13.0–17.0)
Lymphocytes Relative: 43 % (ref 12–46)
MCHC: 33.9 g/dL (ref 30.0–36.0)
MCV: 85.1 fL (ref 78.0–100.0)
Monocytes Absolute: 0.6 10*3/uL (ref 0.1–1.0)
Monocytes Relative: 9 % (ref 3–12)
RBC: 4.44 MIL/uL (ref 4.22–5.81)
RDW: 13.8 % (ref 11.5–15.5)
WBC: 6.6 10*3/uL (ref 4.0–10.5)

## 2013-06-18 LAB — BASIC METABOLIC PANEL WITH GFR
BUN: 28 mg/dL — ABNORMAL HIGH (ref 6–23)
CO2: 31 mEq/L (ref 19–32)
Chloride: 106 mEq/L (ref 96–112)
Creat: 1.44 mg/dL — ABNORMAL HIGH (ref 0.50–1.35)
GFR, Est Non African American: 44 mL/min — ABNORMAL LOW

## 2013-06-18 LAB — LIPID PANEL
Cholesterol: 140 mg/dL (ref 0–200)
VLDL: 12 mg/dL (ref 0–40)

## 2013-06-18 NOTE — Progress Notes (Signed)
Patient ID: Gregory Hatfield, male   DOB: 1926-05-19, 79 y.o.   MRN: [id number]   This very nice 79 y.o. [name] presents for 3 month follow up with Hypertension, Hyperlipidemia, T1 IDDM and Vitamin D Deficiency.    Patient's HTN predates since 66. BP has been controlled at home. Today's  BP: 128/68 mmHg .  In Oct BUN/Creat 20/1.46 with calc GFR 43 most likely due to Hypertensive nephrosclerosis and Diabetic glomerulosclerosis. Patient denies any cardiac type chest pain, palpitations, dyspnea/orthopnea/PND, dizziness, claudication, or dependent edema.   Hyperlipidemia is controlled with diet & meds. Last Cholesterol was 121, Triglycerides were  52, HDL 40 and LDL 71 - all at goal. Patient denies myalgias or other med SE's.    Also, the patient has history of T1 IDDM since 1997 and is followed by Dr Gregory Hatfield with last A1c of in the 8's to accommodate patient's poor understanding of his diabetic regimen and avoid hypoglycemic reactions. Patient denies any symptoms of reactive hypoglycemia, diabetic polys, paresthesias or visual blurring.   Further, Patient has history of Vitamin D Deficiency in 2008 at level of 28 with last vitamin D of 101 in October and his dose was accordingly decreased. Patient supplements vitamin D without any suspected side-effects.    Medication  Sig Dispense Refill  . acetaminophen (TYLENOL ARTHRITIS PAIN) 650 MG CR tablet 2 tablets every 4 hours as needed       . aspirin 81 MG EC tablet 1/2 tablet daily      . carvedilol (COREG) 12.5 MG tablet Take 12.5 mg by mouth 2 (two) times daily with a meal.       . Cholecalciferol (VITAMIN D3) 5000 UNITS CAPS Take 1 capsule by mouth daily.       Marland Kitchen diltiazem (DILACOR XR) 240 MG 24 hr capsule Take 1 capsule (240 mg total) by mouth daily.  30 capsule  3  . econazole nitrate 1 % cream Apply topically 2 (two) times daily. Apply to feet as directed      . furosemide (LASIX) 40 MG tablet Take 40 mg by mouth 2 (two) times daily.       Marland Kitchen  glucose blood (ONE TOUCH ULTRA TEST) test strip Use as directed to check blood sugar 6 times a day dx 250.43  200 each  5  . hydrALAZINE (APRESOLINE) 25 MG tablet Take 12.5 mg by mouth 2 (two) times daily.        . Insulin Pen Needle (B-D UF III MINI PEN NEEDLES) 31G X 5 MM MISC CHECK BLOOD SUGAR 4 TIMES DAILY  100 each  4  . lisinopril (PRINIVIL,ZESTRIL) 20 MG tablet Take 10 mg by mouth daily. 1/2 tablet by mouth daily      . magnesium gluconate (MAGONATE) 500 MG tablet Take 250 mg by mouth daily.      . Mirabegron ER (MYRBETRIQ) 25 MG TB24 1 injection every 3 weeks per pt      . Multiple Vitamin (MULTIVITAMIN) tablet Take 1 tablet by mouth daily.        . pravastatin (PRAVACHOL) 40 MG tablet Take 40 mg by mouth daily.        . psyllium (METAMUCIL) 58.6 % packet Take 1 packet by mouth 2 (two) times daily.        Marland Kitchen diltiazem (CARDIZEM CD) 240 MG 24 hr capsule       . Insulin Detemir (LEVEMIR FLEXPEN [place]) Inject 60 Units into the skin every morning. And pen needles 1/day      .  latanoprost (XALATAN) 0.005 % ophthalmic solution Place 1 drop into the right eye 2 (two) times daily.        Marland Kitchen LUMIGAN 0.01 % SOLN       . SIMBRINZA 1-0.2 % SUSP Place 1 drop into both eyes 3 (three) times daily.         Allergies  Allergen Reactions  . Ace Inhibitors   . Demerol   . Flomax [Tamsulosin Hcl]   . Morphine And Related   . Oxytrol [Oxybutynin] Rash    PMHx:   Past Medical History  Diagnosis Date  . Diabetes mellitus     insulin-dependent  . CHF (congestive heart failure)   . GERD (gastroesophageal reflux disease)   . Hypertension   . Dyslipidemia   . Glaucoma   . CVA (cerebral infarction)     Left Eye  . Diabetic retinopathy   . CKD (chronic kidney disease)   . BPH (benign prostatic hyperplasia)   . History of nuclear stress test 03/2004    inf wall scar from base to apex & anterolateral wall ischemia  . Hyperlipidemia   . Vitamin D deficiency     FHx:    Reviewed / unchanged  SHx:     Reviewed / unchanged  Systems Review: Constitutional: Denies fever, chills, wt changes, headaches, insomnia, fatigue, night sweats, change in appetite. Eyes: Denies redness, blurred vision, diplopia, discharge, itchy, watery eyes.  ENT: Denies discharge, congestion, post nasal drip, epistaxis, sore throat, earache, hearing loss, dental pain, tinnitus, vertigo, sinus pain, snoring.  CV: Denies chest pain, palpitations, irregular heartbeat, syncope, dyspnea, diaphoresis, orthopnea, PND, claudication, edema. Respiratory: denies cough, dyspnea, DOE, pleurisy, hoarseness, laryngitis, wheezing.  Gastrointestinal: Denies dysphagia, odynophagia, heartburn, reflux, water brash, abdominal pain or cramps, nausea, vomiting, bloating, diarrhea, constipation, hematemesis, melena, hematochezia,  or hemorrhoids. Genitourinary: Denies dysuria, frequency, urgency, nocturia, hesitancy, discharge, hematuria, flank pain. Musculoskeletal: Denies arthralgias, myalgias, stiffness, jt. swelling, pain, limp, strain/sprain.  Skin: Denies pruritus, rash, hives, warts, acne, eczema, change in skin lesion(s). Neuro: No weakness, tremor, incoordination, spasms, paresthesia, or pain. Psychiatric: Denies confusion, memory loss, or sensory loss. Endo: Denies change in weight, skin, hair change.  Heme/Lymph: No excessive bleeding, bruising, orenlarged lymph nodes.  Filed Vitals:   06/18/13 1657  BP: 128/68  Pulse: 60  Temp: 97.7 F (36.5 C)  Resp: 16    Estimated body mass index is 27.34 kg/(m^2) as calculated from the following:   Height as of 05/06/13: 6' (1.829 m).   Weight as of this encounter: 201 lb 9.6 oz (91.445 kg).  On Exam: Appears well nourished - in no distress. Eyes: PERRLA, EOMs, conjunctiva no swelling or erythema. Sinuses: No frontal/maxillary tenderness ENT/Mouth: EAC's clear, TM's nl w/o erythema, bulging. Nares clear w/o erythema, swelling, exudates. Oropharynx clear without erythema or exudates.  Oral hygiene is good. Tongue normal, non obstructing. Hearing intact.  Neck: Supple. Thyroid nl. Car 2+/2+ without bruits, nodes or JVD. Chest: Respirations nl with BS clear & equal w/o rales, rhonchi, wheezing or stridor.  Cor: Heart sounds normal w/ regular rate and rhythm without sig. murmurs, gallops, clicks, or rubs. Peripheral pulses normal and equal  without edema.  Abdomen: Soft & bowel sounds normal. Non-tender w/o guarding, rebound, hernias, masses, or organomegaly.  Lymphatics: Unremarkable.  Musculoskeletal: Full ROM all peripheral extremities, joint stability, 5/5 strength, and normal gait.  Skin: Warm, dry without exposed rashes, lesions, ecchymosis apparent.  Neuro: Cranial nerves intact, reflexes equal bilaterally. Sensory-motor testing grossly intact. Tendon reflexes grossly  intact.  Pysch: Alert & oriented x 3. Insight and judgement nl & appropriate. No ideations.  Assessment and Plan:  1. Hypertension - Continue monitor blood pressure at home. Continue diet/meds same.  2. Hyperlipidemia - Continue diet/meds, exercise,& lifestyle modifications. Continue monitor periodic cholesterol/liver & renal functions    3. T1 Diabetes - continue recommend prudent low glycemic diet, weight control, regular exercise, diabetic monitoring and periodic eye exams.  4. Vitamin D Deficiency - Continue supplementation.  Recommended regular exercise, BP monitoring, weight control, and discussed med and SE's. Recommended labs to assess and monitor clinical status. Further disposition pending results of labs.

## 2013-06-18 NOTE — Patient Instructions (Signed)
Hypertension As your heart beats, it forces blood through your arteries. This force is your blood pressure. If the pressure is too high, it is called hypertension (HTN) or high blood pressure. HTN is dangerous because you may have it and not know it. High blood pressure may mean that your heart has to work harder to pump blood. Your arteries may be narrow or stiff. The extra work puts you at risk for heart disease, stroke, and other problems.  Blood pressure consists of two numbers, a higher number over a lower, 110/72, for example. It is stated as "110 over 72." The ideal is below 120 for the top number (systolic) and under 80 for the bottom (diastolic). Write down your blood pressure today. You should pay close attention to your blood pressure if you have certain conditions such as:  Heart failure.  Prior heart attack.  Diabetes  Chronic kidney disease.  Prior stroke.  Multiple risk factors for heart disease. To see if you have HTN, your blood pressure should be measured while you are seated with your arm held at the level of the heart. It should be measured at least twice. A one-time elevated blood pressure reading (especially in the Emergency Department) does not mean that you need treatment. There may be conditions in which the blood pressure is different between your right and left arms. It is important to see your caregiver soon for a recheck. Most people have essential hypertension which means that there is not a specific cause. This type of high blood pressure may be lowered by changing lifestyle factors such as:  Stress.  Smoking.  Lack of exercise.  Excessive weight.  Drug/tobacco/alcohol use.  Eating less salt. Most people do not have symptoms from high blood pressure until it has caused damage to the body. Effective treatment can often prevent, delay or reduce that damage. TREATMENT  When a cause has been identified, treatment for high blood pressure is directed at the  cause. There are a large number of medications to treat HTN. These fall into several categories, and your caregiver will help you select the medicines that are best for you. Medications may have side effects. You should review side effects with your caregiver. If your blood pressure stays high after you have made lifestyle changes or started on medicines,   Your medication(s) may need to be changed.  Other problems may need to be addressed.  Be certain you understand your prescriptions, and know how and when to take your medicine.  Be sure to follow up with your caregiver within the time frame advised (usually within two weeks) to have your blood pressure rechecked and to review your medications.  If you are taking more than one medicine to lower your blood pressure, make sure you know how and at what times they should be taken. Taking two medicines at the same time can result in blood pressure that is too low. SEEK IMMEDIATE MEDICAL CARE IF:  You develop a severe headache, blurred or changing vision, or confusion.  You have unusual weakness or numbness, or a faint feeling.  You have severe chest or abdominal pain, vomiting, or breathing problems. MAKE SURE YOU:   Understand these instructions.  Will watch your condition.  Will get help right away if you are not doing well or get worse. Document Released: 06/06/2005 Document Revised: 08/29/2011 Document Reviewed: 01/25/2008 [facility] Patient Information 2014 [place].  Cholesterol Cholesterol is a white, waxy, fat-like protein needed by your body in small amounts.  The liver makes all the cholesterol you need. It is carried from the liver by the blood through the blood vessels. Deposits (plaque) may build up on blood vessel walls. This makes the arteries narrower and stiffer. Plaque increases the risk for heart attack and stroke. You cannot feel your cholesterol level even if it is very high. The only way to know is by a blood  test to check your lipid (fats) levels. Once you know your cholesterol levels, you should keep a record of the test results. Work with your caregiver to to keep your levels in the desired range. WHAT THE RESULTS MEAN:  Total cholesterol is a rough measure of all the cholesterol in your blood.  LDL is the so-called bad cholesterol. This is the type that deposits cholesterol in the walls of the arteries. You want this level to be low.  HDL is the good cholesterol because it cleans the arteries and carries the LDL away. You want this level to be high.  Triglycerides are fat that the body can either burn for energy or store. High levels are closely linked to heart disease. DESIRED LEVELS:  Total cholesterol below 200.  LDL below 100 for people at risk, below 70 for very high risk.  HDL above 50 is good, above 60 is best.  Triglycerides below 150. HOW TO LOWER YOUR CHOLESTEROL:  Diet.  Choose fish or white meat chicken and [place], roasted or baked. Limit fatty cuts of red meat, fried foods, and processed meats, such as sausage and lunch meat.  Eat lots of fresh fruits and vegetables. Choose whole grains, beans, pasta, potatoes and cereals.  Use only small amounts of olive, corn or canola oils. Avoid butter, mayonnaise, shortening or palm kernel oils. Avoid foods with trans-fats.  Use skim/nonfat milk and low-fat/nonfat yogurt and cheeses. Avoid whole milk, cream, ice cream, egg yolks and cheeses. Healthy desserts include angel food cake, ginger snaps, animal crackers, hard candy, popsicles, and low-fat/nonfat frozen yogurt. Avoid pastries, cakes, pies and cookies.  Exercise.  A regular program helps decrease LDL and raises HDL.  Helps with weight control.  Do things that increase your activity level like gardening, walking, or taking the stairs.  Medication.  May be prescribed by your caregiver to help lowering cholesterol and the risk for heart disease.  You may need medicine  even if your levels are normal if you have several risk factors. HOME CARE INSTRUCTIONS   Follow your diet and exercise programs as suggested by your caregiver.  Take medications as directed.  Have blood work done when your caregiver feels it is necessary. MAKE SURE YOU:   Understand these instructions.  Will watch your condition.  Will get help right away if you are not doing well or get worse. Document Released: 03/01/2001 Document Revised: 08/29/2011 Document Reviewed: 08/22/2007 [facility] Patient Information 2014 [place], [place].  Type 1 Diabetes Mellitus, Adult Type 1 diabetes mellitus, often simply referred to as diabetes, is a long-term (chronic) disease. It occurs when the islet cells in the pancreas that make insulin (a hormone) are destroyed and can no longer make insulin. Insulin is needed to move sugars from food into the tissue cells. The tissue cells use the sugars for energy. In people with type 1 diabetes, the sugars build up in the blood instead of going into the tissue cells. As a result, high blood sugar (hyperglycemia) develops. Without insulin, the body breaks down fat cells for the needed energy. This breakdown of fat cells produces acid  chemicals (ketones), which increases the acid levels in the body. The effect of either high ketone or sugar (glucose) levels can be life-threatening.  Type 1 diabetes was also previously called juvenile diabetes. It most often occurs before the age of 30, but it can occur at any age. RISK FACTORS A person is predisposed to developing type 1 diabetes if someone in his or her family has the disease and is exposed to certain additional environmental triggers.  SYMPTOMS  Symptoms of type 1 diabetes may develop gradually over days to weeks or suddenly. The symptoms occur due to hyperglycemia. The symptoms can include:   Increased thirst (polydipsia).  Increased urination (polyuria).  Increased urination during the night  (nocturia).  Weight loss. This weight loss may be rapid.  Frequent, recurring infections.  Tiredness (fatigue).  Weakness.  Vision changes, such as blurred vision.  Fruity smell to your breath.  Abdominal pain.  Nausea or vomiting. DIAGNOSIS  Type 1 diabetes is diagnosed when symptoms of diabetes are present and when blood glucose levels are increased. Your blood glucose level may be checked by one or more of the following blood tests:  A fasting blood glucose test. You will not be allowed to eat for at least 8 hours before a blood sample is taken.  A random blood glucose test. Your blood glucose is checked at any time of the day regardless of when you ate.  A hemoglobin A1c blood glucose test. A hemoglobin A1c test provides information about blood glucose control over the previous 3 months. TREATMENT  Although type 1 diabetes cannot be prevented, it can be managed with insulin, diet, and exercise.  You will need to take insulin daily to keep blood glucose in the desired range.  You will need to match insulin dosing with exercise and healthy food choices. The treatment goal is to maintain the before-meal blood sugar (preprandial glucose) level at 70 130 mg/dL.  HOME CARE INSTRUCTIONS   Have your hemoglobin A1c level checked twice a year.  Perform daily blood glucose monitoring as directed by your caregiver.  Monitor urine ketones when you are ill and as directed by your caregiver.  Take your insulin as directed by your caregiver to maintain your blood glucose level in the desired range.  Never run out of insulin. It is needed every day.  Adjust insulin based on your intake of carbohydrates. Carbohydrates can raise blood glucose levels but need to be included in your diet. Carbohydrates provide vitamins, minerals, and fiber, which are an essential part of a healthy diet. Carbohydrates are found in fruits, vegetables, whole grains, dairy products, legumes, and foods  containing added sugars.    Eat healthy foods. Alternate 3 meals with 3 snacks.  Maintain a healthy weight.  Carry a medical alert card or wear your medical alert jewelry.  Carry a 15 gram carbohydrate snack with you at all times to treat low blood glucose (hypoglycemia). Some examples of 15 gram carbohydrate snacks include:  Glucose tablets, 3 or 4.   Glucose gel, 15 gram tube.  Raisins, 2 tablespoons (24 grams).  Jelly beans, 6.  Animal crackers, 8.  Fruit juice, regular soda, or low-fat milk, 4 ounces (120 mL).  Gummy treats, 9.    Recognize hypoglycemia. Hypoglycemia occurs with blood glucose levels of 70 mg/dL and below. The risk for hypoglycemia increases when fasting or skipping meals, during or after intense exercise, and during sleep. Hypoglycemia symptoms can include:  Tremors or shakes.  Decreased ability to concentrate.  Sweating.  Increased heart rate.  Headache.  Dry mouth.  Hunger.  Irritability.  Anxiety.  Restless sleep.  Altered speech or coordination.  Confusion.  Treat hypoglycemia promptly. If you are alert and able to safely swallow, follow the 15:15 rule:  Take 15 20 grams of rapid-acting glucose or carbohydrate. Rapid-acting options include glucose gel, glucose tablets, or 4 ounces (120 mL) of fruit juice, regular soda, or low-fat milk.  Check your blood glucose level 15 minutes after taking the glucose.   Take 15 20 grams more of glucose if the repeat blood glucose level is still 70 mg/dL or below.  Eat a meal or snack within 1 hour once blood glucose levels return to normal.  Be alert to polyuria and polydipsia, which are early signs of hyperglycemia. An early awareness of hyperglycemia allows for prompt treatment. Treat hyperglycemia as directed by your caregiver.  Engage in at least 150 minutes of moderate-intensity physical activity a week, spread over at least 3 days of the week or as directed by your  caregiver.  Adjust your insulin dosing and food intake as needed if you start a new exercise or sport.  Follow your sick day plan at any time you are unable to eat or drink as usual.   Avoid tobacco use.  Limit alcohol intake to no more than 1 drink per day for nonpregnant women and 2 drinks per day for men. You should drink alcohol only when you are also eating food. Talk with your caregiver about whether alcohol is safe for you. Tell your caregiver if you drink alcohol several times a week.  Follow up with your caregiver regularly.  Schedule an eye exam within 5 years of diagnosis and then annually.  Perform daily skin and foot care. Examine your skin and feet daily for cuts, bruises, redness, nail problems, bleeding, blisters, or sores. A foot exam by a caregiver should be done annually.  Brush your teeth and gums at least twice a day and floss at least once a day. Follow up with your dentist regularly.  Share your diabetes management plan with your workplace or school.  Stay up-to-date with immunizations.  Learn to manage stress.  Obtain ongoing diabetes education and support as needed.  Participate or seek rehabilitation as needed to maintain or improve independence and quality of life. Request a physical or occupational therapy referral if you are having foot or hand numbness or difficulties with grooming, dressing, eating, or physical activity. SEEK MEDICAL CARE IF:   You are unable to eat food or drink fluids for more than 6 hours.  You have nausea and vomiting for more than 6 hours.  Your blood glucose level is over 240 mg/dL.  There is a change in mental status.  You develop an additional serious illness.  You have diarrhea for more than 6 hours.  You have been sick or have had a fever for a couple of days and are not getting better.  You have pain during any physical activity. SEEK IMMEDIATE MEDICAL CARE IF:  You have difficulty breathing.  You have  moderate to large ketone levels. MAKE SURE YOU:  Understand these instructions.  Will watch your condition.  Will get help right away if you are not doing well or get worse. Document Released: 06/03/2000 Document Revised: 02/29/2012 Document Reviewed: 01/03/2012 [facility] Patient Information 2014 [place].

## 2013-06-19 LAB — VITAMIN D 25 HYDROXY (VIT D DEFICIENCY, FRACTURES): Vit D, 25-Hydroxy: 78 ng/mL (ref 30–89)

## 2013-07-01 ENCOUNTER — Other Ambulatory Visit: Payer: Self-pay

## 2013-07-01 MED ORDER — GLUCOSE BLOOD VI STRP: ORAL_STRIP | Status: DC

## 2013-07-30 ENCOUNTER — Other Ambulatory Visit: Payer: Self-pay | Admitting: *Deleted

## 2013-07-30 MED ORDER — PRAVASTATIN SODIUM 40 MG PO TABS: 40.0000 mg | ORAL_TABLET | Freq: Every day | ORAL | Status: DC

## 2013-08-05 ENCOUNTER — Ambulatory Visit: Payer: Medicare PPO | Admitting: Podiatry

## 2013-08-15 ENCOUNTER — Ambulatory Visit: Payer: Medicare PPO | Admitting: Podiatry

## 2013-08-21 ENCOUNTER — Ambulatory Visit (INDEPENDENT_AMBULATORY_CARE_PROVIDER_SITE_OTHER): Payer: Medicare PPO | Admitting: Podiatry

## 2013-08-21 ENCOUNTER — Encounter: Payer: Self-pay | Admitting: Podiatry

## 2013-08-21 VITALS — BP 149/72 | HR 59 | Resp 17 | Ht 73.0 in | Wt 202.0 lb

## 2013-08-21 DIAGNOSIS — M79609 Pain in unspecified limb: Secondary | ICD-10-CM

## 2013-08-21 DIAGNOSIS — B351 Tinea unguium: Secondary | ICD-10-CM

## 2013-08-21 NOTE — Progress Notes (Signed)
   Subjective:    Patient ID: Gregory Hatfield, male    DOB: [birth year], 80 y.o.   MRN: [id number] Pt states needs to have toenails trims, and since his hip needs replacing, he hasn't been able to put the toenail medicine on them. HPI    Review of Systems     Objective:   Physical Exam        Assessment & Plan:

## 2013-08-22 NOTE — Progress Notes (Signed)
Subjective:     Patient ID: Gregory Hatfield, male   DOB: 07/22/1925, 80 y.o.   MRN: [id number]  HPI patient is found to have nail disease 1-5 both feet which can become discomforting   Review of Systems     Objective:   Physical Exam Neurovascular status intact no health history changes in thick toenails 1-5 both feet    Assessment:     Mycotic nail infection 1-5 both feet    Plan:     Debridement painful nail bed 1-5 both feet with no bleeding noted

## 2013-08-23 ENCOUNTER — Ambulatory Visit: Payer: Medicare PPO | Admitting: Endocrinology

## 2013-09-02 ENCOUNTER — Encounter: Payer: Self-pay | Admitting: Endocrinology

## 2013-09-02 ENCOUNTER — Ambulatory Visit (INDEPENDENT_AMBULATORY_CARE_PROVIDER_SITE_OTHER): Payer: Medicare PPO | Admitting: Endocrinology

## 2013-09-02 VITALS — BP 128/66 | HR 56 | Temp 97.5°F | Ht 73.0 in | Wt 199.0 lb

## 2013-09-02 DIAGNOSIS — E1065 Type 1 diabetes mellitus with hyperglycemia: Principal | ICD-10-CM

## 2013-09-02 DIAGNOSIS — E1029 Type 1 diabetes mellitus with other diabetic kidney complication: Secondary | ICD-10-CM

## 2013-09-02 LAB — HEMOGLOBIN A1C
Hgb A1c MFr Bld: 8.2 % — ABNORMAL HIGH (ref <5.7)
Mean Plasma Glucose: 189 mg/dL — ABNORMAL HIGH (ref <117)

## 2013-09-02 NOTE — Progress Notes (Signed)
Subjective:    Patient ID: Gregory Hatfield, male    DOB: Feb 18, 1926, 80 y.o.   MRN: [id number]  HPI Pt returns for f/u of insulin-requiring DM (dx'ed 1975, when he presented with dizziness; he has mild if any neuropathy of the lower extremities; he has associated renal insuff and CHF; he has been on insulin since 2003; in 2008, he was changed to a simple BID insulin regimen, due to noncompliance with multiple daily injections; he has never had severe hypoglycemia or DKA; in november of 2014, pt had an episode of hypoglycemia in the evening, while driving--he was able to pull over, though; his happend after he missed a meal). Since last ov, he had 1 more episode of hypoglycemia, and this was mild.  It happened after a meal was missed.  he brings a record of his cbg's which i have reviewed today.  It is 50-70 once a week.  This happens when a meal is missed or delayed.  Most cbg's are in the 100's, but it is over 200 approx every other day.   Past Medical History  Diagnosis Date  . Diabetes mellitus     insulin-dependent  . CHF (congestive heart failure)   . GERD (gastroesophageal reflux disease)   . Hypertension   . Dyslipidemia   . Glaucoma   . CVA (cerebral infarction)     Left Eye  . Diabetic retinopathy   . CKD (chronic kidney disease)   . BPH (benign prostatic hyperplasia)   . History of nuclear stress test 03/2004    inf wall scar from base to apex & anterolateral wall ischemia  . Hyperlipidemia   . Vitamin D deficiency     Past Surgical History  Procedure Laterality Date  . Cardiac catheterization  04/2004    normal r-sided pressure, mild pulm HTN  . Prostate surgery  10/2010    transurethral resection   . Transthoracic echocardiogram  05/2006    EF 60-70%; mild calcif of MV; mild MV regurg; LA mildly dilated    History   Social History  . Marital Status: Widowed    Spouse Name: N/A    Number of Children: 40  . Years of Education: master's   Occupational History    . teacher/instructor    Social History Main Topics  . Smoking status: Former Smoker    Quit date: 06/21/1983  . Smokeless tobacco: Never Used  . Alcohol Use: Yes     Comment: wine every now and then  . Drug Use: No  . Sexual Activity: Not on file   Other Topics Concern  . Not on file   Social History Narrative  . No narrative on file    Current Outpatient Prescriptions on File Prior to Visit  Medication Sig Dispense Refill  . acetaminophen (TYLENOL ARTHRITIS PAIN) 650 MG CR tablet 2 tablets every 4 hours as needed       . aspirin 81 MG EC tablet 1/2 tablet daily      . carvedilol (COREG) 12.5 MG tablet Take 12.5 mg by mouth 2 (two) times daily with a meal.       . Cholecalciferol (VITAMIN D3) 5000 UNITS CAPS Take 1 capsule by mouth daily.       Marland Kitchen diltiazem (CARDIZEM CD) 240 MG 24 hr capsule       . diltiazem (DILACOR XR) 240 MG 24 hr capsule Take 1 capsule (240 mg total) by mouth daily.  30 capsule  3  . econazole nitrate 1 %  cream Apply topically 2 (two) times daily. Apply to feet as directed      . furosemide (LASIX) 40 MG tablet Take 40 mg by mouth 2 (two) times daily.       Marland Kitchen glucose blood (ONE TOUCH ULTRA TEST) test strip Use as directed to check blood sugar 6 times a day dx 250.43  200 each  5  . hydrALAZINE (APRESOLINE) 25 MG tablet Take 12.5 mg by mouth 2 (two) times daily.        . Insulin Detemir (LEVEMIR FLEXPEN [place]) Inject 60 Units into the skin every morning. And pen needles 1/day      . Insulin Detemir (LEVEMIR FLEXTOUCH) 100 UNIT/ML SOPN INJECT 60 UNITS INTO THE SKIN DAILY AS DIRECTED.      . Insulin Pen Needle (B-D UF III MINI PEN NEEDLES) 31G X 5 MM MISC CHECK BLOOD SUGAR 4 TIMES DAILY  100 each  4  . latanoprost (XALATAN) 0.005 % ophthalmic solution Place 1 drop into the right eye 2 (two) times daily.        Marland Kitchen lisinopril (PRINIVIL,ZESTRIL) 20 MG tablet Take 10 mg by mouth daily. 1/2 tablet by mouth daily      . LUMIGAN 0.01 % SOLN       . magnesium gluconate  (MAGONATE) 500 MG tablet Take 250 mg by mouth daily.      . Mirabegron ER (MYRBETRIQ) 25 MG TB24 1 injection every 3 weeks per pt      . Multiple Vitamin (MULTIVITAMIN) tablet Take 1 tablet by mouth daily.        . pravastatin (PRAVACHOL) 40 MG tablet Take 1 tablet (40 mg total) by mouth daily.  90 tablet  1  . psyllium (METAMUCIL) 58.6 % packet Take 1 packet by mouth 2 (two) times daily.        Marland Kitchen SIMBRINZA 1-0.2 % SUSP Place 1 drop into both eyes 3 (three) times daily.       No current facility-administered medications on file prior to visit.    Allergies  Allergen Reactions  . Ace Inhibitors   . Demerol   . Flomax [Tamsulosin Hcl]   . Morphine And Related   . Oxytrol [Oxybutynin] Rash    Family History  Problem Relation Age of Onset  . Diabetes Mother   . Hypertension Mother   . Prostate cancer Father   . Heart disease Father   . Diabetes Brother   . Cancer Brother   . Heart disease Brother   . Diabetes Sister   . Heart disease Sister   . Hyperlipidemia Child   . Hypertension Child   . Diabetes Child   . Heart attack Child   . Cancer Child     BP 128/66  Pulse 56  Temp(Src) 97.5 F (36.4 C) (Oral)  Ht 6\' 1"  (1.854 m)  Wt 199 lb (90.266 kg)  BMI 26.26 kg/m2  SpO2 96%  Review of Systems Denies LOC and weight change    Objective:   Physical Exam VITAL SIGNS:  See vs page GENERAL: no distress      Assessment & Plan:  DM: this insulin regimen was chosen from multiple options, for its simplicity.  The benefits of glycemic control must be weighed against the risks of hypoglycemia.  Due to recurrent hypoglycemia, insulin must be prescribed with great caution. CHF.  This, especially in the context of his advanced age, limits the exercise rx of his DM.

## 2013-09-02 NOTE — Patient Instructions (Addendum)
Please come back for a follow-up appointment in 3 months. check your blood sugar 6 times a day--before the 3 meals, and at bedtime.  also check if you have symptoms of your blood sugar being too high or too low.  please keep a record of the readings and bring it to your next appointment here.  please call us sooner if you are having low blood sugar episodes.   On this type of insulin schedule, you should eat meals on a regular schedule.  If a meal is missed or significantly delayed, your blood sugar could go low. To avoid the lows, carry some packets of cheese and crackers.  This way, you can avoid the lows.   A diabetes blood test is requested for you today.  We'll contact you with results.    Please reduce the insulin to 60 units each morning.

## 2013-09-10 ENCOUNTER — Encounter: Payer: Self-pay | Admitting: Emergency Medicine

## 2013-09-10 ENCOUNTER — Ambulatory Visit (INDEPENDENT_AMBULATORY_CARE_PROVIDER_SITE_OTHER): Payer: Medicare PPO | Admitting: Emergency Medicine

## 2013-09-10 VITALS — BP 140/62 | HR 62 | Temp 98.6°F | Resp 18 | Ht 70.5 in | Wt 202.0 lb

## 2013-09-10 DIAGNOSIS — E119 Type 2 diabetes mellitus without complications: Secondary | ICD-10-CM

## 2013-09-10 DIAGNOSIS — Z789 Other specified health status: Secondary | ICD-10-CM

## 2013-09-10 DIAGNOSIS — Z0000 Encounter for general adult medical examination without abnormal findings: Secondary | ICD-10-CM

## 2013-09-10 DIAGNOSIS — E782 Mixed hyperlipidemia: Secondary | ICD-10-CM

## 2013-09-10 DIAGNOSIS — Z1331 Encounter for screening for depression: Secondary | ICD-10-CM

## 2013-09-10 DIAGNOSIS — I10 Essential (primary) hypertension: Secondary | ICD-10-CM

## 2013-09-10 LAB — CBC WITH DIFFERENTIAL/PLATELET
BASOS PCT: 1 % (ref 0–1)
Basophils Absolute: 0.1 10*3/uL (ref 0.0–0.1)
EOS ABS: 0.1 10*3/uL (ref 0.0–0.7)
Eosinophils Relative: 2 % (ref 0–5)
HEMATOCRIT: 37.7 % — AB (ref 39.0–52.0)
Hemoglobin: 12.6 g/dL — ABNORMAL LOW (ref 13.0–17.0)
Lymphocytes Relative: 44 % (ref 12–46)
Lymphs Abs: 2.6 10*3/uL (ref 0.7–4.0)
MCH: 29 pg (ref 26.0–34.0)
MCHC: 33.4 g/dL (ref 30.0–36.0)
MCV: 86.7 fL (ref 78.0–100.0)
MONO ABS: 0.5 10*3/uL (ref 0.1–1.0)
Monocytes Relative: 9 % (ref 3–12)
NEUTROS PCT: 44 % (ref 43–77)
Neutro Abs: 2.6 10*3/uL (ref 1.7–7.7)
Platelets: 194 10*3/uL (ref 150–400)
RBC: 4.35 MIL/uL (ref 4.22–5.81)
RDW: 14 % (ref 11.5–15.5)
WBC: 6 10*3/uL (ref 4.0–10.5)

## 2013-09-10 LAB — LIPID PANEL
Cholesterol: 141 mg/dL (ref 0–200)
HDL: 45 mg/dL (ref >39)
LDL Cholesterol: 80 mg/dL (ref 0–99)
Total CHOL/HDL Ratio: 3.1 Ratio
Triglycerides: 80 mg/dL (ref <150)
VLDL: 16 mg/dL (ref 0–40)

## 2013-09-10 LAB — BASIC METABOLIC PANEL WITH GFR
BUN: 21 mg/dL (ref 6–23)
CO2: 28 mEq/L (ref 19–32)
CREATININE: 1.45 mg/dL — AB (ref 0.50–1.35)
Calcium: 9.3 mg/dL (ref 8.4–10.5)
Chloride: 105 mEq/L (ref 96–112)
GFR, EST AFRICAN AMERICAN: 50 mL/min — AB
GFR, Est Non African American: 44 mL/min — ABNORMAL LOW
Glucose, Bld: 175 mg/dL — ABNORMAL HIGH (ref 70–99)
Potassium: 4.3 mEq/L (ref 3.5–5.3)
Sodium: 141 mEq/L (ref 135–145)

## 2013-09-10 LAB — HEPATIC FUNCTION PANEL
ALK PHOS: 72 U/L (ref 39–117)
ALT: 20 U/L (ref 0–53)
AST: 21 U/L (ref 0–37)
Albumin: 4 g/dL (ref 3.5–5.2)
Bilirubin, Direct: 0.1 mg/dL (ref 0.0–0.3)
Indirect Bilirubin: 0.2 mg/dL (ref 0.2–1.2)
TOTAL PROTEIN: 6.5 g/dL (ref 6.0–8.3)
Total Bilirubin: 0.3 mg/dL (ref 0.2–1.2)

## 2013-09-10 NOTE — Progress Notes (Signed)
Patient ID: Gregory Hatfield, male   DOB: Jun 07, 1926, 80 y.o.   MRN: [id number] Subjective:  Gregory Hatfield is a 80 y.o. male who presents for Medicare Annual Wellness Visit and 3 month follow up for HTN, hyperlipidemia, Insulin Dependent DM, and vitamin D Def.  Date of last medicare wellness visit was is unknown.  He has been under treatment for incontinence related to prostate surgery with shock therapy. He denies any improvement with symptoms with therapy.  His blood pressure has been controlled at home, today their BP is BP: 140/62 mmHg He does workout. He denies chest pain, shortness of breath, dizziness.  He is on cholesterol medication and denies myalgias. His cholesterol is at goal. The cholesterol last visit was:   Lab Results  Component Value Date   CHOL 141 09/10/2013   HDL 45 09/10/2013   LDLCALC 80 09/10/2013   TRIG 80 09/10/2013   CHOLHDL 3.1 09/10/2013   He has been working on diet and exercise for Insulin Dependent diabetes, and denies foot ulcerations, nausea, visual disturbances and weight loss. He has feet checked by Dr Loanne Drilling q 3 month, f/u was Monday. Last A1C in the office was:  Lab Results  Component Value Date   HGBA1C 8.2* 09/02/2013   Patient is on Vitamin D supplement.   No components found with this basename: VITD25OH     Names of Other Physician/Practitioners you currently use: Patient Care Team: [name], MD as PCP - General (Internal Medicine) Renato Shin, MD as Consulting Physician (Endocrinology) Diane Dalphine Handing (Optometry) Yvette Rack., MD as Consulting Physician (Orthopedic Surgery) Ailene Rud, MD as Consulting Physician (Urology) Winfield Cunas., MD as Consulting Physician (Gastroenterology) Windy Kalata, MD as Consulting Physician (Nephrology) Ninetta Lights, MD as Consulting Physician (Orthopedic Surgery) Pixie Casino, MD as Consulting Physician (Cardiology)  Medication Review: Current Outpatient  Prescriptions on File Prior to Visit  Medication Sig Dispense Refill  . acetaminophen (TYLENOL ARTHRITIS PAIN) 650 MG CR tablet 2 tablets every 4 hours as needed       . aspirin 81 MG EC tablet 1/2 tablet daily      . carvedilol (COREG) 12.5 MG tablet Take 12.5 mg by mouth 2 (two) times daily with a meal.       . Cholecalciferol (VITAMIN D3) 5000 UNITS CAPS Take 1 capsule by mouth daily.       Marland Kitchen diltiazem (CARDIZEM CD) 240 MG 24 hr capsule       . diltiazem (DILACOR XR) 240 MG 24 hr capsule Take 1 capsule (240 mg total) by mouth daily.  30 capsule  3  . econazole nitrate 1 % cream Apply topically 2 (two) times daily. Apply to feet as directed      . furosemide (LASIX) 40 MG tablet Take 40 mg by mouth 2 (two) times daily.       Marland Kitchen glucose blood (ONE TOUCH ULTRA TEST) test strip Use as directed to check blood sugar 6 times a day dx 250.43  200 each  5  . hydrALAZINE (APRESOLINE) 25 MG tablet Take 12.5 mg by mouth 2 (two) times daily.        . Insulin Detemir (LEVEMIR FLEXPEN [place]) Inject 60 Units into the skin every morning. And pen needles 1/day      . Insulin Pen Needle (B-D UF III MINI PEN NEEDLES) 31G X 5 MM MISC CHECK BLOOD SUGAR 4 TIMES DAILY  100 each  4  . lisinopril (PRINIVIL,ZESTRIL)  20 MG tablet Take 10 mg by mouth daily. 1/2 tablet by mouth daily      . LUMIGAN 0.01 % SOLN       . magnesium gluconate (MAGONATE) 500 MG tablet Take 250 mg by mouth daily.      . Mirabegron ER (MYRBETRIQ) 25 MG TB24 1 injection every 3 weeks per pt      . Multiple Vitamin (MULTIVITAMIN) tablet Take 1 tablet by mouth daily.        . pravastatin (PRAVACHOL) 40 MG tablet Take 1 tablet (40 mg total) by mouth daily.  90 tablet  1  . psyllium (METAMUCIL) 58.6 % packet Take 1 packet by mouth 2 (two) times daily.        Marland Kitchen SIMBRINZA 1-0.2 % SUSP Place 1 drop into both eyes 3 (three) times daily.       No current facility-administered medications on file prior to visit.    Current Problems (verified) Patient  Active Problem List   Diagnosis Date Noted  . Chronic renal insufficiency, stage III (moderate) 06/18/2013  . Unspecified vitamin D deficiency 06/16/2013  . Alzheimer's disease 06/16/2013  . Mixed hyperlipidemia 06/16/2013  . Type I (juvenile type) diabetes mellitus with neurological manifestations, not stated as uncontrolled 06/16/2013  . Type I (juvenile type) diabetes mellitus with renal manifestations, uncontrolled 10/07/2011  . BPH (benign prostatic hyperplasia) 07/01/2011  . HYPERCHOLESTEROLEMIA 12/08/2009  . GLAUCOMA 12/08/2009  . OVERACTIVE BLADDER 12/08/2009  . HYPERTENSION 01/10/2007  . GERD 01/10/2007    Screening Tests Health Maintenance  Topic Date Due  . Zostavax  11/09/1987  . Influenza Vaccine  01/18/2014  . Colonoscopy  06/20/2018  . Tetanus/tdap  08/29/2018  . Pneumococcal Polysaccharide Vaccine Age 51 And Over  Completed    Immunization History  Administered Date(s) Administered  . DTaP 09/28/2006  . Influenza Whole 04/05/2010, 04/04/2013  . Influenza-Unspecified 02/19/2011  . Pneumococcal Polysaccharide-23 07/21/2001, 03/20/2008  . Td 08/28/2008    Preventative care: Last colonoscopy: 2010  Prior vaccinations: TD or Tdap: 2008  Influenza: 04/04/13 Pneumococcal: 2009, 2003 Shingles/Zostavax: declines to cost  History reviewed:  Past Medical History  Diagnosis Date  . Diabetes mellitus     insulin-dependent  . CHF (congestive heart failure)   . GERD (gastroesophageal reflux disease)   . Hypertension   . Dyslipidemia   . Glaucoma   . CVA (cerebral infarction)     Left Eye  . Diabetic retinopathy   . CKD (chronic kidney disease)   . BPH (benign prostatic hyperplasia)   . History of nuclear stress test 03/2004    inf wall scar from base to apex & anterolateral wall ischemia  . Hyperlipidemia   . Vitamin D deficiency    Past Surgical History  Procedure Laterality Date  . Cardiac catheterization  04/2004    normal r-sided pressure, mild  pulm HTN  . Prostate surgery  10/2010    transurethral resection   . Transthoracic echocardiogram  05/2006    EF 60-70%; mild calcif of MV; mild MV regurg; LA mildly dilated   History  Substance Use Topics  . Smoking status: Former Smoker    Quit date: 06/21/1983  . Smokeless tobacco: Never Used  . Alcohol Use: Yes     Comment: wine every now and then   Family History  Problem Relation Age of Onset  . Diabetes Mother   . Hypertension Mother   . Prostate cancer Father   . Heart disease Father   . Diabetes Brother   .  Cancer Brother   . Heart disease Brother   . Diabetes Sister   . Heart disease Sister   . Hyperlipidemia Child   . Hypertension Child   . Diabetes Child   . Heart attack Child   . Cancer Child     Risk Factors: Tobacco History  Substance Use Topics  . Smoking status: Former Smoker    Quit date: 06/21/1983  . Smokeless tobacco: Never Used  . Alcohol Use: Yes     Comment: wine every now and then   He does not smoke.  Patient is a former smoker. Are there smokers in your home (other than you)?  No  Alcohol Current alcohol use: none  Caffeine Current caffeine use: coffee 1 /day  Exercise Current exercise habits: Home exercise routine includes stretching and walking .5 hrs per days.  Current exercise: bicycling and housecleaning  Nutrition/Diet Current diet: in general, a "healthy" diet    Cardiac risk factors: diabetes mellitus.  Depression Screen Nurse depression screen reviewed.  (Note: if answer to either of the following is "Yes", a more complete depression screening is indicated)   Q1: Over the past two weeks, have you felt down, depressed or hopeless? No  Q2: Over the past two weeks, have you felt little interest or pleasure in doing things? No  Have you lost interest or pleasure in daily life? No  Do you often feel hopeless? No  Do you cry easily over simple problems? No  Activities of Daily Living Nurse ADLs screen reviewed.  In  your present state of health, do you have any difficulty performing the following activities?:  Driving? Yes, requires transporation Managing money?  No Feeding yourself? No Getting from bed to chair? No Climbing a flight of stairs? Difficulty climbing down only Preparing food and eating?: No Bathing or showering? No Getting dressed: No Getting to the toilet? No Using the toilet:No Moving around from place to place: No In the past year have you fallen or had a near fall?:No   Are you sexually active?  No  Do you have more than one partner?  No  Vision Difficulties: No  Hearing Difficulties: No Do you often ask people to speak up or repeat themselves? No Do you experience ringing or noises in your ears? No Do you have difficulty understanding soft or whispered voices? No  Cognition  Do you feel that you have a problem with memory?No  Do you often misplace items? No  Do you feel safe at home?  Yes  Advanced directives Does patient have a [place]? Yes, [place] Muse Does patient have a Living Will? Yes   Objective:     Vision and hearing screens reviewed.   Blood pressure 140/62, pulse 62, temperature 98.6 F (37 C), temperature source Temporal, resp. rate 18, height 5' 10.5" (1.791 m), weight 202 lb (91.627 kg). Body mass index is 28.56 kg/(m^2).  General appearance: alert, no distress, WD/WN, male Cognitive Testing  Alert? Yes  Normal Appearance?Yes  Oriented to person? Yes  Place? Yes   Time? Yes  Recall of three objects?  Yes  Can perform simple calculations? Yes  Displays appropriate judgment?Yes  Can read the correct time from a watch face?Yes  HEENT: normocephalic, sclerae anicteric, TMs pearly, nares patent, no discharge or erythema, pharynx normal Oral cavity: MMM, no lesions Neck: supple, no lymphadenopathy, no thyromegaly, no masses Heart: RRR, normal S1, S2, no murmurs Lungs: CTA bilaterally, no wheezes, rhonchi, or  rales Abdomen: +bs,  soft, non tender, non distended, no masses, no hepatomegaly, no splenomegaly Musculoskeletal: nontender, no swelling, no obvious deformity, walks with cain, slow to change position, almost shuffling gait Extremities: no edema, no cyanosis, no clubbing Pulses: 2+ symmetric, upper and lower extremities, normal cap refill Neurological: alert, oriented x 3, CN2-12 intact, strength normal upper extremities and lower extremities, sensation normal throughout, DTRs 2+ throughout, no cerebellar signs, gait normal Psychiatric: normal affect, behavior normal, pleasant   Assessment:  1.  3 month F/U for HTN, Cholesterol,DM, D. Deficient. Needs healthy diet, cardio QD and obtain healthy weight. Check Labs, Check BP if >130/80 call office, Check BS if >200 call office  2. Medicare wellness exam/CPE- Update screening labs/ History/ Immunizations/ Testing as needed. Advised healthy diet, QD exercise, increase H20 and continue RX/ Vitamins AD.   Plan:  See bove During the course of the visit the patient was educated and counseled about appropriate screening and preventive services including:    Diabetes screening  Glaucoma screening  Nutrition counseling   Advanced directives: has NO advanced directive - not interested in additional information  Screening recommendations, referrals: Vaccinations: Tdap vaccine no  Influenza vaccine no Pneumococcal vaccine no Shingles vaccine no Hep B vaccine no  Nutrition assessed and recommended  Colonoscopy no Recommended yearly ophthalmology/optometry visit for glaucoma screening and checkup Recommended yearly dental visit for hygiene and checkup Advanced directives - yes  Conditions/risks identified: BMI: Discussed weight loss, diet, and increase physical activity.  Increase physical activity: AHA recommends 150 minutes of physical activity a week.  Medications reviewed Diabetes is not at goal, ACE/ARB therapy: Yes. Urinary  Incontinence is an issue: discussed non pharmacology and pharmacology options.  Fall risk: moderate- discussed PT, home fall assessment, medications.    Medicare Attestation I have personally reviewed: The patient's medical and social history Their use of alcohol, tobacco or illicit drugs Their current medications and supplements The patient's functional ability including ADLs,fall risks, home safety risks, cognitive, and hearing and visual impairment Diet and physical activities Evidence for depression or mood disorders  The patient's weight, height, BMI, and visual acuity have been recorded in the chart.  I have made referrals, counseling, and provided education to the patient based on review of the above and I have provided the patient with a written personalized care plan for preventive services.     Kelby Aline, R, PA-C   09/11/2013     CPT [id number] first AWV CPT [phone] subsequent AWV

## 2013-09-10 NOTE — Patient Instructions (Signed)
We want weight loss that will last so you should lose 1-2 pounds a week.  THAT IS IT! Please pick THREE things a month to change. Once it is a habit check off the item. Then pick another three items off the list to become habits.  If you are already doing a habit on the list GREAT!  Cross that item off! o Don't drink your calories. Ie, alcohol, soda, fruit juice, and sweet tea.  o Drink more water. Drink a glass when you feel hungry or before each meal.  o Eat breakfast - Complex carb and protein (likeDannon light and fit yogurt, oatmeal, fruit, eggs, [place] bacon). o Measure your cereal.  Eat no more than one cup a day. (ie [place]) o Eat an apple a day. o Add a vegetable a day. o Try a new vegetable a month. o Use Pam! Stop using oil or butter to cook. o Don't finish your plate or use smaller plates. o Share your dessert. o Eat sugar free Jello for dessert or frozen grapes. o Don't eat 2-3 hours before bed. o Switch to whole wheat bread, pasta, and brown rice. o Make healthier choices when you eat out. No fries! o Pick baked chicken, NOT fried. o Don't forget to SLOW DOWN when you eat. It is not going anywhere.  o Take the stairs. o Park far away in the parking lot o [employer] (or weights) for 10 minutes while watching TV. o Walk at work for 10 minutes during break. o Walk outside 1 time a week with your friend, kids, dog, or significant other. o Start a walking group at [place] the mall as much as you can tolerate.  o Keep a food diary. o Weigh yourself daily. o Walk for 15 minutes 3 days per week. o Cook at home more often and eat out less.  If life happens and you go back to old habits, it is okay.  Just start over. You can do it!   If you experience chest pain, get short of breath, or tired during the exercise, please stop immediately and inform your doctor.  Diabetes and Exercise Exercising regularly is important. It is not just about losing weight. It has many health  benefits, such as:  Improving your overall fitness, flexibility, and endurance.  Increasing your bone density.  Helping with weight control.  Decreasing your body fat.  Increasing your muscle strength.  Reducing stress and tension.  Improving your overall health. People with diabetes who exercise gain additional benefits because exercise:  Reduces appetite.  Improves the body's use of blood sugar (glucose).  Helps lower or control blood glucose.  Decreases blood pressure.  Helps control blood lipids (such as cholesterol and triglycerides).  Improves the body's use of the hormone insulin by:  Increasing the body's insulin sensitivity.  Reducing the body's insulin needs.  Decreases the risk for heart disease because exercising:  Lowers cholesterol and triglycerides levels.  Increases the levels of good cholesterol (such as high-density lipoproteins [HDL]) in the body.  Lowers blood glucose levels. YOUR ACTIVITY PLAN  Choose an activity that you enjoy and set realistic goals. Your health care provider or diabetes educator can help you make an activity plan that works for you. You can break activities into 2 or 3 sessions throughout the day. Doing so is as good as one long session. Exercise ideas include:  Taking the dog for a walk.  Taking the stairs instead of the elevator.  Dancing  to your favorite song.  Doing your favorite exercise with a friend. RECOMMENDATIONS FOR EXERCISING WITH TYPE 1 OR TYPE 2 DIABETES   Check your blood glucose before exercising. If blood glucose levels are greater than 240 mg/dL, check for urine ketones. Do not exercise if ketones are present.  Avoid injecting insulin into areas of the body that are going to be exercised. For example, avoid injecting insulin into:  The arms when playing tennis.  The legs when jogging.  Keep a record of:  Food intake before and after you exercise.  Expected peak times of insulin action.  Blood  glucose levels before and after you exercise.  The type and amount of exercise you have done.  Review your records with your health care provider. Your health care provider will help you to develop guidelines for adjusting food intake and insulin amounts before and after exercising.  If you take insulin or oral hypoglycemic agents, watch for signs and symptoms of hypoglycemia. They include:  Dizziness.  Shaking.  Sweating.  Chills.  Confusion.  Drink plenty of water while you exercise to prevent dehydration or heat stroke. Body water is lost during exercise and must be replaced.  Talk to your health care provider before starting an exercise program to make sure it is safe for you. Remember, almost any type of activity is better than none. Document Released: 08/27/2003 Document Revised: 02/06/2013 Document Reviewed: 11/13/2012 [facility] Patient Information 2014 [place].

## 2013-09-11 ENCOUNTER — Other Ambulatory Visit: Payer: Self-pay | Admitting: Endocrinology

## 2013-09-11 ENCOUNTER — Other Ambulatory Visit: Payer: Self-pay | Admitting: Internal Medicine

## 2013-10-04 ENCOUNTER — Emergency Department (HOSPITAL_COMMUNITY)
Admission: EM | Admit: 2013-10-04 | Discharge: 2013-10-05 | Disposition: A | Discharge status: Home / Self Care | Payer: Medicare PPO | Attending: Emergency Medicine | Admitting: Emergency Medicine

## 2013-10-04 ENCOUNTER — Emergency Department (HOSPITAL_COMMUNITY): Payer: Medicare PPO

## 2013-10-04 ENCOUNTER — Encounter (HOSPITAL_COMMUNITY): Payer: Self-pay | Admitting: Emergency Medicine

## 2013-10-04 DIAGNOSIS — Z87891 Personal history of nicotine dependence: Secondary | ICD-10-CM | POA: Insufficient documentation

## 2013-10-04 DIAGNOSIS — E11319 Type 2 diabetes mellitus with unspecified diabetic retinopathy without macular edema: Secondary | ICD-10-CM | POA: Insufficient documentation

## 2013-10-04 DIAGNOSIS — Z9889 Other specified postprocedural states: Secondary | ICD-10-CM | POA: Insufficient documentation

## 2013-10-04 DIAGNOSIS — J40 Bronchitis, not specified as acute or chronic: Secondary | ICD-10-CM

## 2013-10-04 DIAGNOSIS — Z8719 Personal history of other diseases of the digestive system: Secondary | ICD-10-CM | POA: Insufficient documentation

## 2013-10-04 DIAGNOSIS — I509 Heart failure, unspecified: Secondary | ICD-10-CM | POA: Insufficient documentation

## 2013-10-04 DIAGNOSIS — Z792 Long term (current) use of antibiotics: Secondary | ICD-10-CM | POA: Insufficient documentation

## 2013-10-04 DIAGNOSIS — Z87448 Personal history of other diseases of urinary system: Secondary | ICD-10-CM | POA: Insufficient documentation

## 2013-10-04 DIAGNOSIS — Z8673 Personal history of transient ischemic attack (TIA), and cerebral infarction without residual deficits: Secondary | ICD-10-CM | POA: Insufficient documentation

## 2013-10-04 DIAGNOSIS — Z7982 Long term (current) use of aspirin: Secondary | ICD-10-CM | POA: Insufficient documentation

## 2013-10-04 DIAGNOSIS — J209 Acute bronchitis, unspecified: Secondary | ICD-10-CM | POA: Insufficient documentation

## 2013-10-04 DIAGNOSIS — I129 Hypertensive chronic kidney disease with stage 1 through stage 4 chronic kidney disease, or unspecified chronic kidney disease: Secondary | ICD-10-CM | POA: Insufficient documentation

## 2013-10-04 DIAGNOSIS — N189 Chronic kidney disease, unspecified: Secondary | ICD-10-CM | POA: Insufficient documentation

## 2013-10-04 DIAGNOSIS — E785 Hyperlipidemia, unspecified: Secondary | ICD-10-CM | POA: Insufficient documentation

## 2013-10-04 DIAGNOSIS — Z794 Long term (current) use of insulin: Secondary | ICD-10-CM | POA: Insufficient documentation

## 2013-10-04 DIAGNOSIS — E1139 Type 2 diabetes mellitus with other diabetic ophthalmic complication: Secondary | ICD-10-CM | POA: Insufficient documentation

## 2013-10-04 DIAGNOSIS — Z79899 Other long term (current) drug therapy: Secondary | ICD-10-CM | POA: Insufficient documentation

## 2013-10-04 LAB — BASIC METABOLIC PANEL
BUN: 22 mg/dL (ref 6–23)
CALCIUM: 9.5 mg/dL (ref 8.4–10.5)
CO2: 25 meq/L (ref 19–32)
Chloride: 107 mEq/L (ref 96–112)
Creatinine, Ser: 1.49 mg/dL — ABNORMAL HIGH (ref 0.50–1.35)
GFR calc Af Amer: 48 mL/min — ABNORMAL LOW (ref >90)
GFR, EST NON AFRICAN AMERICAN: 41 mL/min — AB (ref >90)
GLUCOSE: 45 mg/dL — AB (ref 70–99)
POTASSIUM: 3.7 meq/L (ref 3.7–5.3)
Sodium: 144 mEq/L (ref 137–147)

## 2013-10-04 LAB — CBC WITH DIFFERENTIAL/PLATELET
Basophils Absolute: 0 10*3/uL (ref 0.0–0.1)
Basophils Relative: 1 % (ref 0–1)
EOS ABS: 0.2 10*3/uL (ref 0.0–0.7)
Eosinophils Relative: 3 % (ref 0–5)
HCT: 38.5 % — ABNORMAL LOW (ref 39.0–52.0)
Hemoglobin: 12.5 g/dL — ABNORMAL LOW (ref 13.0–17.0)
LYMPHS ABS: 3.7 10*3/uL (ref 0.7–4.0)
LYMPHS PCT: 44 % (ref 12–46)
MCH: 29 pg (ref 26.0–34.0)
MCHC: 32.5 g/dL (ref 30.0–36.0)
MCV: 89.3 fL (ref 78.0–100.0)
Monocytes Absolute: 0.8 10*3/uL (ref 0.1–1.0)
Monocytes Relative: 9 % (ref 3–12)
NEUTROS PCT: 43 % (ref 43–77)
Neutro Abs: 3.5 10*3/uL (ref 1.7–7.7)
Platelets: 179 10*3/uL (ref 150–400)
RBC: 4.31 MIL/uL (ref 4.22–5.81)
RDW: 13.8 % (ref 11.5–15.5)
WBC: 8.1 10*3/uL (ref 4.0–10.5)

## 2013-10-04 NOTE — ED Notes (Signed)
Patient states he has chronic pain in his left hip and knee(waiting for replacements) and left shoulder where he pulled a muscle   Pain has been going on for several years.

## 2013-10-04 NOTE — ED Notes (Signed)
Pt moved to Pod A from [facility] due to age and symptoms.  He states he has a chronic cough "bronchitis I guess" and for past 5 days has been noticing some blood in his sputum.  No other issues bring him to the ED today.

## 2013-10-04 NOTE — ED Notes (Signed)
Patient transported to X-ray 

## 2013-10-04 NOTE — ED Notes (Signed)
Patient presents stating that he has been having a dry cough (bronchitis per his MD) for years.  Since Monday he has been having a dry cough and sometimes he has been  Noticing some blood streaks in his mucous.  States it is not much but got worried about it

## 2013-10-05 LAB — CBG MONITORING, ED: GLUCOSE-CAPILLARY: 113 mg/dL — AB (ref 70–99)

## 2013-10-05 MED ORDER — AZITHROMYCIN 250 MG PO TABS: 250.0000 mg | ORAL_TABLET | Freq: Every day | ORAL | Status: DC

## 2013-10-05 NOTE — ED Provider Notes (Signed)
CSN: [id number]     Arrival date & time 10/04/13  1936 History   First MD Initiated Contact with Patient 10/04/13 2206     Chief Complaint  Patient presents with  . Cough     (Consider location/radiation/quality/duration/timing/severity/associated sxs/prior Treatment) Patient is a 80 y.o. male presenting with cough. The history is provided by the patient.  Cough Associated symptoms: no chest pain, no fever, no headaches and no sore throat    patient with a productive cough for the past week. In the past is normal he does have a dry cough. Patient is diabetic. The sputum has been streaked with some blood this week. No frank blood or gross blood. Patient has no chest pain no shortness of breath. No palpitations. No lightheadedness no feeling of near-syncope no dizziness.  Past Medical History  Diagnosis Date  . Diabetes mellitus     insulin-dependent  . CHF (congestive heart failure)   . GERD (gastroesophageal reflux disease)   . Hypertension   . Dyslipidemia   . Glaucoma   . CVA (cerebral infarction)     Left Eye  . Diabetic retinopathy   . CKD (chronic kidney disease)   . BPH (benign prostatic hyperplasia)   . History of nuclear stress test 03/2004    inf wall scar from base to apex & anterolateral wall ischemia  . Hyperlipidemia   . Vitamin D deficiency    Past Surgical History  Procedure Laterality Date  . Cardiac catheterization  04/2004    normal r-sided pressure, mild pulm HTN  . Prostate surgery  10/2010    transurethral resection   . Transthoracic echocardiogram  05/2006    EF 60-70%; mild calcif of MV; mild MV regurg; LA mildly dilated   Family History  Problem Relation Age of Onset  . Diabetes Mother   . Hypertension Mother   . Prostate cancer Father   . Heart disease Father   . Diabetes Brother   . Cancer Brother   . Heart disease Brother   . Diabetes Sister   . Heart disease Sister   . Hyperlipidemia Child   . Hypertension Child   . Diabetes Child    . Heart attack Child   . Cancer Child    History  Substance Use Topics  . Smoking status: Former Smoker    Quit date: 06/21/1983  . Smokeless tobacco: Never Used  . Alcohol Use: Yes     Comment: wine every now and then    Review of Systems  Constitutional: Negative for fever.  HENT: Negative for congestion and sore throat.   Eyes: Negative for redness.  Respiratory: Positive for cough.   Cardiovascular: Negative for chest pain.  Gastrointestinal: Negative for nausea, vomiting, abdominal pain and diarrhea.  Genitourinary: Negative for dysuria and hematuria.  Musculoskeletal: Negative for back pain.  Neurological: Negative for dizziness, light-headedness and headaches.  Hematological: Does not bruise/bleed easily.  Psychiatric/Behavioral: Negative for confusion.      Allergies  Ace inhibitors; Demerol; Flomax; Morphine and related; and Oxytrol  Home Medications   Prior to Admission medications   Medication Sig Start Date End Date Taking? Authorizing Provider  acetaminophen (TYLENOL ARTHRITIS PAIN) 650 MG CR tablet Take 1,300 mg by mouth every 4 (four) hours as needed for pain.    Yes Historical Provider, MD  aspirin 81 MG EC tablet Take 81 mg by mouth daily.    Yes Historical Provider, MD  carvedilol (COREG) 12.5 MG tablet Take 12.5 mg by mouth 2 (two)  times daily with a meal. For blood pressure   Yes Historical Provider, MD  Cholecalciferol (VITAMIN D3) 5000 UNITS CAPS Take 1 capsule by mouth daily.    Yes Historical Provider, MD  diltiazem (DILACOR XR) 240 MG 24 hr capsule Take 240 mg by mouth daily. For blood pressure   Yes Historical Provider, MD  furosemide (LASIX) 40 MG tablet Take 40 mg by mouth 2 (two) times daily. For fluid retention   Yes Historical Provider, MD  glucose blood (ONE TOUCH ULTRA TEST) test strip Use as directed to check blood sugar 6 times a day dx 250.43 07/01/13  Yes Renato Shin, MD  hydrALAZINE (APRESOLINE) 25 MG tablet Take 25 mg by mouth 2 (two)  times daily. For blood pressure   Yes Historical Provider, MD  Insulin Detemir (LEVEMIR FLEXPEN [place]) Inject 60 Units into the skin every morning. And pen needles 1/day   Yes Historical Provider, MD  insulin detemir (LEVEMIR) 100 UNIT/ML injection Inject 60 Units into the skin at bedtime. For diabetes   Yes Historical Provider, MD  Insulin Pen Needle (B-D UF III MINI PEN NEEDLES) 31G X 5 MM MISC CHECK BLOOD SUGAR 4 TIMES DAILY 02/14/13  Yes Renato Shin, MD  lisinopril (PRINIVIL,ZESTRIL) 20 MG tablet Take 10 mg by mouth daily. For blood pressure   Yes Historical Provider, MD  LUMIGAN 0.01 % SOLN Place 1 drop into the right eye at bedtime.  03/29/13  Yes Historical Provider, MD  magnesium gluconate (MAGONATE) 500 MG tablet Take 250 mg by mouth daily.   Yes Historical Provider, MD  Mirabegron ER (MYRBETRIQ) 25 MG TB24 Take 25 mg by mouth daily. For bladder   Yes Historical Provider, MD  Multiple Vitamin (MULTIVITAMIN) tablet Take 1 tablet by mouth daily.     Yes Historical Provider, MD  pravastatin (PRAVACHOL) 40 MG tablet Take 40 mg by mouth daily. For cholesterol   Yes Historical Provider, MD  SIMBRINZA 1-0.2 % SUSP Place 1 drop into both eyes 3 (three) times daily. 02/28/13  Yes Historical Provider, MD  azithromycin (ZITHROMAX) 250 MG tablet Take 1 tablet (250 mg total) by mouth daily. Take first 2 tablets together, then 1 every day until finished. 10/05/13   Mervin Kung, MD   BP 147/65  Pulse 50  Temp(Src) 98.8 F (37.1 C) (Oral)  Resp 20  Ht 6' (1.829 m)  Wt 202 lb (91.627 kg)  BMI 27.39 kg/m2  SpO2 98% Physical Exam  Nursing note and vitals reviewed. Constitutional: He is oriented to person, place, and time. He appears well-developed and well-nourished. No distress.  HENT:  Head: Normocephalic and atraumatic.  Mouth/Throat: Oropharynx is clear and moist. No oropharyngeal exudate.  Eyes: Conjunctivae are normal. Pupils are equal, round, and reactive to light.  Neck: Normal range of  motion. Neck supple.  Cardiovascular: Normal rate, regular rhythm and normal heart sounds.   Pulmonary/Chest: Effort normal and breath sounds normal. No respiratory distress.  Abdominal: Soft. Bowel sounds are normal. There is no tenderness.  Musculoskeletal: Normal range of motion.  Neurological: He is alert and oriented to person, place, and time. No cranial nerve deficit. He exhibits normal muscle tone. Coordination normal.  Skin: Skin is warm. No rash noted.    ED Course  Procedures (including critical care time) Labs Review Labs Reviewed  CBC WITH DIFFERENTIAL - Abnormal; Notable for the following:    Hemoglobin 12.5 (*)    HCT 38.5 (*)    All other components within normal limits  BASIC  METABOLIC PANEL - Abnormal; Notable for the following:    Glucose, Bld 45 (*)    Creatinine, Ser 1.49 (*)    GFR calc non Af Amer 41 (*)    GFR calc Af Amer 48 (*)    All other components within normal limits  CBG MONITORING, ED   Results for orders placed during the hospital encounter of 10/04/13  CBC WITH DIFFERENTIAL      Result Value Ref Range   WBC 8.1  4.0 - 10.5 K/uL   RBC 4.31  4.22 - 5.81 MIL/uL   Hemoglobin 12.5 (*) 13.0 - 17.0 g/dL   HCT 38.5 (*) 39.0 - 52.0 %   MCV 89.3  78.0 - 100.0 fL   MCH 29.0  26.0 - 34.0 pg   MCHC 32.5  30.0 - 36.0 g/dL   RDW 13.8  11.5 - 15.5 %   Platelets 179  150 - 400 K/uL   Neutrophils Relative % 43  43 - 77 %   Neutro Abs 3.5  1.7 - 7.7 K/uL   Lymphocytes Relative 44  12 - 46 %   Lymphs Abs 3.7  0.7 - 4.0 K/uL   Monocytes Relative 9  3 - 12 %   Monocytes Absolute 0.8  0.1 - 1.0 K/uL   Eosinophils Relative 3  0 - 5 %   Eosinophils Absolute 0.2  0.0 - 0.7 K/uL   Basophils Relative 1  0 - 1 %   Basophils Absolute 0.0  0.0 - 0.1 K/uL  BASIC METABOLIC PANEL      Result Value Ref Range   Sodium 144  137 - 147 mEq/L   Potassium 3.7  3.7 - 5.3 mEq/L   Chloride 107  96 - 112 mEq/L   CO2 25  19 - 32 mEq/L   Glucose, Bld 45 (*) 70 - 99 mg/dL    BUN 22  6 - 23 mg/dL   Creatinine, Ser 1.49 (*) 0.50 - 1.35 mg/dL   Calcium 9.5  8.4 - 10.5 mg/dL   GFR calc non Af Amer 41 (*) >90 mL/min   GFR calc Af Amer 48 (*) >90 mL/min     Imaging Review Dg Chest 2 View  10/04/2013   CLINICAL DATA:  80 year old male with cough productive of bloody sputum. Initial encounter.  EXAM: CHEST  2 VIEW  COMPARISON:  12/09/2011 and earlier.  FINDINGS: Stable cardiomegaly and mediastinal contours. Stable lung volumes. No pneumothorax, pulmonary edema, pleural effusion or confluent pulmonary opacity. Visualized tracheal air column is within normal limits. No acute osseous abnormality identified.  IMPRESSION: Stable cardiomegaly. No acute cardiopulmonary abnormality.   Electronically Signed   By: Lars Pinks M.D.   On: 10/04/2013 23:48     EKG Interpretation None      MDM   Final diagnoses:  Bronchitis    Patient's symptoms of some blood streak sputum for the last week sounds as if it could be consistent with a bronchitis. Patient has no shortness of breath or chest pain not concerned clinically at all about the pulmonary embolus. Patient does have diabetes. Normally eats 6 meals a day. Small meals. Outpatient blood sugar here which is noted to be 45 patient is not terribly symptomatic mentating fine however we given food and fingerstick will be rechecked. Blood sugar of if above 60 can probably be discharged home. Will treat as a bronchitis with Zithromax. Patient has regular Dr. to followup with. Patient is nontoxic no acute distress.    Carzell Saldivar  Lenard Lance, MD 10/05/13 [phone]

## 2013-10-05 NOTE — Discharge Instructions (Signed)
Bronchitis Bronchitis is swelling (inflammation) of the air tubes leading to your lungs (bronchi). This causes mucus and a cough. If the swelling gets bad, you may have trouble breathing. HOME CARE   Rest.  Drink enough fluids to keep your pee (urine) clear or pale yellow (unless you have a condition where you have to watch how much you drink).  Only take medicine as told by your doctor. If you were given antibiotic medicines, finish them even if you start to feel better.  Avoid smoke, irritating chemicals, and strong smells. These make the problem worse. Quit smoking if you smoke. This helps your lungs heal faster.  Use a cool mist humidifier. Change the water in the humidifier every day. You can also sit in the bathroom with hot shower running for 5 10 minutes. Keep the door closed.  See your health care provider as told.  Wash your hands often. GET HELP IF: Your problems do not get better after 1 week. GET HELP RIGHT AWAY IF:   Your fever gets worse.  You have chills.  Your chest hurts.  Your problems breathing get worse.  You have blood in your mucus.  You pass out (faint).  You feel lightheaded.  You have a bad headache.  You throw up (vomit) again and again. MAKE SURE YOU:  Understand these instructions.  Will watch your condition.  Will get help right away if you are not doing well or get worse. Document Released: 11/23/2007 Document Revised: 03/27/2013 Document Reviewed: 01/29/2013 [facility] Patient Information 2014 [place], [place].  Return for increased coughing up of blood. Make up with followup with your doctor. Return for any shortness of breath or fever. Take antibiotic as directed. Today's chest x-ray was negative.

## 2013-10-10 ENCOUNTER — Ambulatory Visit: Payer: Self-pay | Admitting: Internal Medicine

## 2013-10-23 ENCOUNTER — Encounter: Payer: Self-pay | Admitting: Internal Medicine

## 2013-10-23 ENCOUNTER — Ambulatory Visit (INDEPENDENT_AMBULATORY_CARE_PROVIDER_SITE_OTHER): Payer: Medicare PPO | Admitting: Internal Medicine

## 2013-10-23 VITALS — BP 146/62 | HR 56 | Temp 98.2°F | Resp 16 | Ht 70.5 in | Wt 202.0 lb

## 2013-10-23 DIAGNOSIS — E782 Mixed hyperlipidemia: Secondary | ICD-10-CM

## 2013-10-23 DIAGNOSIS — E1029 Type 1 diabetes mellitus with other diabetic kidney complication: Secondary | ICD-10-CM

## 2013-10-23 DIAGNOSIS — I10 Essential (primary) hypertension: Secondary | ICD-10-CM

## 2013-10-23 NOTE — Patient Instructions (Signed)

## 2013-10-23 NOTE — Progress Notes (Signed)
Patient ID: Gregory Hatfield, male   DOB: 11-Nov-1925, 80 y.o.   MRN: [id number]   This very nice 80 y.o. male presents for 3 week follow up from an ER visit for an acute bronchitis for which he was treated with a Z Pak. He did have a negative CXR in the ER. He reports resolution of cough and no current respiratory complaints. He also is followed for  Hypertension, Hyperlipidemia, T1 DM and Vitamin D Deficiency.    HTN predates since 1991 and is controlled with today's BP: 146/62 mmHg. Patient denies any cardiac type chest pain, palpitations, dyspnea/orthopnea/PND, dizziness, claudication, or dependent edema. Hyperlipidemia is controlled with diet & meds.   Also, the patient has history of T1 DM since 1997  And Stage 3 CKD (GFR 43 ml/min) and is followed by Dr Loanne Drilling for his Diabetes who has follow a schedule allowing slightly higher glucoses to avoid hypoglycemia in this patient who is marginally functioning. He relates glucoses usually range about 140 mg% and range from 130-150 mg%.Patient reports occasional Sx's of Hypoglycemia with CBG's about 60 and usually promptly rescued with a snack.  He denies any  diabetic polys, paresthesias or visual blurring.   Further, Patient has history of Vitamin D Deficiency of 28 in 1997 and supplements vitamin D without any suspected side-effects.    Medication List       aspirin 81 MG EC tablet  Take 81 mg by mouth daily.     carvedilol 12.5 MG tablet  Commonly known as:  COREG  Take 12.5 mg by mouth 2 (two) times daily with a meal. For blood pressure     diltiazem 240 MG 24 hr capsule  Commonly known as:  DILACOR XR  Take 240 mg by mouth daily. For blood pressure     furosemide 40 MG tablet  Commonly known as:  LASIX  Take 40 mg by mouth 2 (two) times daily. For fluid retention     glucose blood test strip  Commonly known as:  ONE TOUCH ULTRA TEST  Use as directed to check blood sugar 6 times a day dx 250.43     hydrALAZINE 25 MG tablet   Commonly known as:  APRESOLINE  Take 25 mg by mouth 2 (two) times daily. For blood pressure     Insulin Pen Needle 31G X 5 MM Misc  Commonly known as:  B-D UF III MINI PEN NEEDLES  CHECK BLOOD SUGAR 4 TIMES DAILY     LEVEMIR FLEXTOUCH 100 UNIT/ML Pen  Generic drug:  Insulin Detemir  Inject 60 Units into the skin daily at 10 pm.     LUMIGAN 0.01 % Soln  Generic drug:  bimatoprost  Place 1 drop into the right eye at bedtime.     magnesium gluconate 500 MG tablet  Commonly known as:  MAGONATE  Take 250 mg by mouth daily.     multivitamin tablet  Take 1 tablet by mouth daily.     MYRBETRIQ 25 MG Tb24 tablet  Generic drug:  mirabegron ER  Take 25 mg by mouth daily. For bladder     pravastatin 40 MG tablet  Commonly known as:  PRAVACHOL  Take 40 mg by mouth daily. For cholesterol     SIMBRINZA 1-0.2 % Susp  Generic drug:  Brinzolamide-Brimonidine  Place 1 drop into both eyes 3 (three) times daily.     TYLENOL ARTHRITIS PAIN 650 MG CR tablet  Generic drug:  acetaminophen  Take 1,300 mg by mouth every  4 (four) hours as needed for pain.     Vitamin D3 5000 UNITS Caps  Take 1 capsule by mouth daily.       Allergies  Allergen Reactions  . Ace Inhibitors Other (See Comments)    Reaction unknown  . Demerol Other (See Comments)    Reaction unknown  . Flomax [Tamsulosin Hcl] Other (See Comments)    Reaction unknown  . Morphine And Related Other (See Comments)    Reaction unknown  . Oxytrol [Oxybutynin] Rash    PMHx:   Past Medical History  Diagnosis Date  . Diabetes mellitus     insulin-dependent  . CHF (congestive heart failure)   . GERD (gastroesophageal reflux disease)   . Hypertension   . Dyslipidemia   . Glaucoma   . CVA (cerebral infarction)     Left Eye  . Diabetic retinopathy   . CKD (chronic kidney disease)   . BPH (benign prostatic hyperplasia)   . History of nuclear stress test 03/2004    inf wall scar from base to apex & anterolateral wall  ischemia  . Hyperlipidemia   . Vitamin D deficiency     FHx:    Reviewed / unchanged  SHx:    Reviewed / unchanged   Systems Review: Constitutional: Denies fever, chills, wt changes, headaches, insomnia, fatigue, night sweats, change in appetite. Eyes: Denies redness, blurred vision, diplopia, discharge, itchy, watery eyes.  ENT: Denies discharge, congestion, post nasal drip, epistaxis, sore throat, earache, hearing loss, dental pain, tinnitus, vertigo, sinus pain, snoring.  CV: Denies chest pain, palpitations, irregular heartbeat, syncope, dyspnea, diaphoresis, orthopnea, PND, claudication or edema. Respiratory: denies cough, dyspnea, DOE, pleurisy, hoarseness, laryngitis, wheezing.  Gastrointestinal: Denies dysphagia, odynophagia, heartburn, reflux, water brash, abdominal pain or cramps, nausea, vomiting, bloating, diarrhea, constipation, hematemesis, melena, hematochezia  or hemorrhoids. Genitourinary: Denies dysuria, frequency, urgency, nocturia, hesitancy, discharge, hematuria or flank pain. Musculoskeletal: Denies arthralgias, myalgias, stiffness, jt. swelling, pain, limping or strain/sprain.  Skin: Denies pruritus, rash, hives, warts, acne, eczema or change in skin lesion(s). Neuro: No weakness, tremor, incoordination, spasms, paresthesia or pain. Psychiatric: Denies confusion, memory loss or sensory loss. Endo: Denies change in weight, skin or hair change.  Heme/Lymph: No excessive bleeding, bruising or enlarged lymph nodes.   Exam:  BP 146/62  Pulse 56  Temp 98.2 F   Resp 16  Ht 5' 10.5"   Wt 202 lb   BMI 28.56 kg/m2  Appears well nourished - in no distress. Eyes: PERRLA, EOMs, conjunctiva no swelling or erythema. Sinuses: No frontal/maxillary tenderness ENT/Mouth: EAC's clear, TM's nl w/o erythema, bulging. Nares clear w/o erythema, swelling, exudates. Oropharynx clear without erythema or exudates. Oral hygiene is good. Tongue normal, non obstructing. Hearing intact.   Neck: Supple. Thyroid nl. Car 2+/2+ without bruits, nodes or JVD. Chest: Respirations nl with BS clear & equal w/o rales, rhonchi, wheezing or stridor.  Cor: Heart sounds normal w/ regular rate and rhythm without sig. murmurs, gallops, clicks, or rubs. Peripheral pulses normal and equal  without edema.  Abdomen: Soft & bowel sounds normal. Non-tender w/o guarding, rebound, hernias, masses, or organomegaly.  Lymphatics: Unremarkable.  Musculoskeletal: Full ROM all peripheral extremities, joint stability, 5/5 strength, and normal gait.  Skin: Warm, dry without exposed rashes, lesions or ecchymosis apparent.  Neuro: Cranial nerves intact, reflexes equal bilaterally. Sensory-motor testing grossly intact. Tendon reflexes grossly intact.  Pysch: Alert & oriented x 3. No ideations.  Assessment and Plan:  1. Hypertension - Continue monitor blood pressure at  home. Continue diet/meds same.  2. Hyperlipidemia - Continue diet/meds, exercise,& lifestyle modifications. Continue monitor periodic cholesterol/liver & renal functions   3. T1 IDDM with CKD - continue recommend prudent low glycemic diet, weight control, regular exercise, diabetic monitoring and periodic eye exams.  4. Vitamin D Deficiency - Continue supplementation.  Recommended regular exercise, BP monitoring, weight control, and discussed med and SE's. Recommended labs to assess and monitor clinical status. Further disposition pending results of labs.

## 2013-11-18 ENCOUNTER — Other Ambulatory Visit: Payer: Self-pay | Admitting: Internal Medicine

## 2013-11-18 ENCOUNTER — Telehealth: Payer: Self-pay | Admitting: *Deleted

## 2013-11-18 NOTE — Telephone Encounter (Signed)
LM for patient to call back  Per Dr. Debara Pickett  - patient will need lexiscan myoview for pre-operative eval for left total hip replacement.  Patient can be offered office visit with Dr. Debara Pickett and the test can be ordered at this visit as well.

## 2013-11-18 NOTE — Telephone Encounter (Signed)
Patient states that he can wait for OV with Dr. Debara Pickett on 6/23 as his surgery is not til August.

## 2013-11-25 ENCOUNTER — Ambulatory Visit (INDEPENDENT_AMBULATORY_CARE_PROVIDER_SITE_OTHER): Payer: Medicare PPO | Admitting: Podiatry

## 2013-11-25 ENCOUNTER — Encounter: Payer: Self-pay | Admitting: Podiatry

## 2013-11-25 DIAGNOSIS — M79609 Pain in unspecified limb: Secondary | ICD-10-CM

## 2013-11-25 DIAGNOSIS — B351 Tinea unguium: Secondary | ICD-10-CM

## 2013-11-25 NOTE — Patient Instructions (Signed)
Diabetes and Foot Care Diabetes may cause you to have problems because of poor blood supply (circulation) to your feet and legs. This may cause the skin on your feet to become thinner, break easier, and heal more slowly. Your skin may become dry, and the skin may peel and crack. You may also have nerve damage in your legs and feet causing decreased feeling in them. You may not notice minor injuries to your feet that could lead to infections or more serious problems. Taking care of your feet is one of the most important things you can do for yourself.  HOME CARE INSTRUCTIONS  Wear shoes at all times, even in the house. Do not go barefoot. Bare feet are easily injured.  Check your feet daily for blisters, cuts, and redness. If you cannot see the bottom of your feet, use a mirror or ask someone for help.  Wash your feet with warm water (do not use hot water) and mild soap. Then pat your feet and the areas between your toes until they are completely dry. Do not soak your feet as this can dry your skin.  Apply a moisturizing lotion or petroleum jelly (that does not contain alcohol and is unscented) to the skin on your feet and to dry, brittle toenails. Do not apply lotion between your toes.  Trim your toenails straight across. Do not dig under them or around the cuticle. File the edges of your nails with an emery board or nail file.  Do not cut corns or calluses or try to remove them with medicine.  Wear clean socks or stockings every day. Make sure they are not too tight. Do not wear knee-high stockings since they may decrease blood flow to your legs.  Wear shoes that fit properly and have enough cushioning. To break in new shoes, wear them for just a few hours a day. This prevents you from injuring your feet. Always look in your shoes before you put them on to be sure there are no objects inside.  Do not cross your legs. This may decrease the blood flow to your feet.  If you find a minor scrape,  cut, or break in the skin on your feet, keep it and the skin around it clean and dry. These areas may be cleansed with mild soap and water. Do not cleanse the area with peroxide, alcohol, or iodine.  When you remove an adhesive bandage, be sure not to damage the skin around it.  If you have a wound, look at it several times a day to make sure it is healing.  Do not use heating pads or hot water bottles. They may burn your skin. If you have lost feeling in your feet or legs, you may not know it is happening until it is too late.  Make sure your health care provider performs a complete foot exam at least annually or more often if you have foot problems. Report any cuts, sores, or bruises to your health care provider immediately. SEEK MEDICAL CARE IF:   You have an injury that is not healing.  You have cuts or breaks in the skin.  You have an ingrown nail.  You notice redness on your legs or feet.  You feel burning or tingling in your legs or feet.  You have pain or cramps in your legs and feet.  Your legs or feet are numb.  Your feet always feel cold. SEEK IMMEDIATE MEDICAL CARE IF:   There is increasing redness,   swelling, or pain in or around a wound.  There is a red line that goes up your leg.  Pus is coming from a wound.  You develop a fever or as directed by your health care provider.  You notice a bad smell coming from an ulcer or wound. Document Released: 06/03/2000 Document Revised: 02/06/2013 Document Reviewed: 11/13/2012 [employer] Patient Information 2014 [employer].  

## 2013-11-26 NOTE — Progress Notes (Signed)
Subjective:     Patient ID: Gregory Hatfield, male   DOB: 10-Sep-1925, 80 y.o.   MRN: [id number]  HPI patient presents with painful thick yellow nailbeds 1-5 both feet that he cannot cut   Review of Systems     Objective:   Physical Exam Neurovascular status intact with thick yellow brittle nailbeds 1-5 both feet that are painful    Assessment:     Mycotic nail infection with pain 1-5 both feet    Plan:     Debris painful nail bed 1-5 both feet with no iatrogenic bleeding noted

## 2013-12-09 ENCOUNTER — Ambulatory Visit: Payer: Medicare PPO | Admitting: Endocrinology

## 2013-12-10 ENCOUNTER — Ambulatory Visit: Payer: Medicare PPO | Admitting: Internal Medicine

## 2013-12-10 ENCOUNTER — Telehealth: Payer: Self-pay | Admitting: Internal Medicine

## 2013-12-11 ENCOUNTER — Ambulatory Visit (INDEPENDENT_AMBULATORY_CARE_PROVIDER_SITE_OTHER): Payer: Medicare PPO | Admitting: Internal Medicine

## 2013-12-11 ENCOUNTER — Encounter: Payer: Self-pay | Admitting: Internal Medicine

## 2013-12-11 VITALS — BP 142/62 | HR 60 | Temp 97.9°F | Resp 18 | Ht 70.0 in | Wt 206.6 lb

## 2013-12-11 DIAGNOSIS — Z125 Encounter for screening for malignant neoplasm of prostate: Secondary | ICD-10-CM

## 2013-12-11 DIAGNOSIS — E1049 Type 1 diabetes mellitus with other diabetic neurological complication: Secondary | ICD-10-CM

## 2013-12-11 DIAGNOSIS — E1029 Type 1 diabetes mellitus with other diabetic kidney complication: Secondary | ICD-10-CM

## 2013-12-11 DIAGNOSIS — E559 Vitamin D deficiency, unspecified: Secondary | ICD-10-CM

## 2013-12-11 DIAGNOSIS — Z0000 Encounter for general adult medical examination without abnormal findings: Secondary | ICD-10-CM

## 2013-12-11 DIAGNOSIS — Z1212 Encounter for screening for malignant neoplasm of rectum: Secondary | ICD-10-CM

## 2013-12-11 DIAGNOSIS — Z789 Other specified health status: Secondary | ICD-10-CM

## 2013-12-11 DIAGNOSIS — Z79899 Other long term (current) drug therapy: Secondary | ICD-10-CM

## 2013-12-11 DIAGNOSIS — Z113 Encounter for screening for infections with a predominantly sexual mode of transmission: Secondary | ICD-10-CM

## 2013-12-11 DIAGNOSIS — Z1331 Encounter for screening for depression: Secondary | ICD-10-CM

## 2013-12-11 DIAGNOSIS — E782 Mixed hyperlipidemia: Secondary | ICD-10-CM

## 2013-12-11 DIAGNOSIS — I10 Essential (primary) hypertension: Secondary | ICD-10-CM

## 2013-12-11 LAB — CBC WITH DIFFERENTIAL/PLATELET
BASOS ABS: 0 10*3/uL (ref 0.0–0.1)
BASOS PCT: 0 % (ref 0–1)
Eosinophils Absolute: 0.2 10*3/uL (ref 0.0–0.7)
Eosinophils Relative: 3 % (ref 0–5)
HEMATOCRIT: 37.5 % — AB (ref 39.0–52.0)
Hemoglobin: 12.3 g/dL — ABNORMAL LOW (ref 13.0–17.0)
LYMPHS PCT: 39 % (ref 12–46)
Lymphs Abs: 2.1 10*3/uL (ref 0.7–4.0)
MCH: 28.5 pg (ref 26.0–34.0)
MCHC: 32.8 g/dL (ref 30.0–36.0)
MCV: 87 fL (ref 78.0–100.0)
Monocytes Absolute: 0.4 10*3/uL (ref 0.1–1.0)
Monocytes Relative: 7 % (ref 3–12)
NEUTROS ABS: 2.8 10*3/uL (ref 1.7–7.7)
Neutrophils Relative %: 51 % (ref 43–77)
PLATELETS: 179 10*3/uL (ref 150–400)
RBC: 4.31 MIL/uL (ref 4.22–5.81)
RDW: 13.7 % (ref 11.5–15.5)
WBC: 5.4 10*3/uL (ref 4.0–10.5)

## 2013-12-11 LAB — BASIC METABOLIC PANEL WITH GFR
BUN: 30 mg/dL — AB (ref 6–23)
CHLORIDE: 106 meq/L (ref 96–112)
CO2: 26 mEq/L (ref 19–32)
Calcium: 9 mg/dL (ref 8.4–10.5)
Creat: 1.47 mg/dL — ABNORMAL HIGH (ref 0.50–1.35)
GFR, EST AFRICAN AMERICAN: 49 mL/min — AB
GFR, EST NON AFRICAN AMERICAN: 43 mL/min — AB
Glucose, Bld: 150 mg/dL — ABNORMAL HIGH (ref 70–99)
POTASSIUM: 4.5 meq/L (ref 3.5–5.3)
SODIUM: 141 meq/L (ref 135–145)

## 2013-12-11 LAB — HEPATIC FUNCTION PANEL
ALT: 25 U/L (ref 0–53)
AST: 24 U/L (ref 0–37)
Albumin: 3.7 g/dL (ref 3.5–5.2)
Alkaline Phosphatase: 68 U/L (ref 39–117)
BILIRUBIN DIRECT: 0.1 mg/dL (ref 0.0–0.3)
Indirect Bilirubin: 0.3 mg/dL (ref 0.2–1.2)
TOTAL PROTEIN: 6.3 g/dL (ref 6.0–8.3)
Total Bilirubin: 0.4 mg/dL (ref 0.2–1.2)

## 2013-12-11 LAB — MAGNESIUM: Magnesium: 1.9 mg/dL (ref 1.5–2.5)

## 2013-12-11 LAB — LIPID PANEL
CHOLESTEROL: 144 mg/dL (ref 0–200)
HDL: 38 mg/dL — AB (ref >39)
LDL Cholesterol: 83 mg/dL (ref 0–99)
Total CHOL/HDL Ratio: 3.8 Ratio
Triglycerides: 114 mg/dL (ref <150)
VLDL: 23 mg/dL (ref 0–40)

## 2013-12-11 LAB — TSH: TSH: 0.662 u[IU]/mL (ref 0.350–4.500)

## 2013-12-11 LAB — HEMOGLOBIN A1C
Hgb A1c MFr Bld: 7.7 % — ABNORMAL HIGH (ref <5.7)
MEAN PLASMA GLUCOSE: 174 mg/dL — AB (ref <117)

## 2013-12-11 LAB — URIC ACID: Uric Acid, Serum: 4.7 mg/dL (ref 4.0–7.8)

## 2013-12-11 NOTE — Telephone Encounter (Signed)
Closed encounter °

## 2013-12-11 NOTE — Patient Instructions (Signed)
Vitamin D Deficiency Vitamin D is an important vitamin that your body needs. Having too little of it in your body is called a deficiency. A very bad deficiency can make your bones soft and can cause a condition called rickets.  Vitamin D is important to your body for different reasons, such as:   It helps your body absorb 2 minerals called calcium and phosphorus.  It helps make your bones healthy.  It may prevent some diseases, such as diabetes and multiple sclerosis.  It helps your muscles and heart. You can get vitamin D in several ways. It is a natural part of some foods. The vitamin is also added to some dairy products and cereals. Some people take vitamin D supplements. Also, your body makes vitamin D when you are in the sun. It changes the sun's rays into a form of the vitamin that your body can use. CAUSES   Not eating enough foods that contain vitamin D.  Not getting enough sunlight.  Having certain digestive system diseases that make it hard to absorb vitamin D. These diseases include Crohn's disease, chronic pancreatitis, and cystic fibrosis.  Having a surgery in which part of the stomach or small intestine is removed.  Being obese. Fat cells pull vitamin D out of your blood. That means that obese people may not have enough vitamin D left in their blood and in other body tissues.  Having chronic kidney or liver disease. RISK FACTORS Risk factors are things that make you more likely to develop a vitamin D deficiency. They include:  Being older.  Not being able to get outside very much.  Living in a nursing home.  Having had broken bones.  Having weak or thin bones (osteoporosis).  Having a disease or condition that changes how your body absorbs vitamin D.  Having dark skin.  Some medicines such as seizure medicines or steroids.  Being overweight or obese. SYMPTOMS Mild cases of vitamin D deficiency may not have any symptoms. If you have a very bad case, symptoms  may include:  Bone pain.  Muscle pain.  Falling often.  Broken bones caused by a minor injury, due to osteoporosis. DIAGNOSIS A blood test is the best way to tell if you have a vitamin D deficiency. TREATMENT Vitamin D deficiency can be treated in different ways. Treatment for vitamin D deficiency depends on what is causing it. Options include:  Taking vitamin D supplements.  Taking a calcium supplement. Your caregiver will suggest what dose is best for you. HOME CARE INSTRUCTIONS  Take any supplements that your caregiver prescribes. Follow the directions carefully. Take only the suggested amount.  Have your blood tested 2 months after you start taking supplements.  Eat foods that contain vitamin D. Healthy choices include:  Fortified dairy products, cereals, or juices. Fortified means vitamin D has been added to the food. Check the label on the package to be sure.  Fatty fish like salmon or trout.  Eggs.  Oysters.  Do not use a tanning bed.  Keep your weight at a healthy level. Lose weight if you need to.  Keep all follow-up appointments. Your caregiver will need to perform blood tests to make sure your vitamin D deficiency is going away. SEEK MEDICAL CARE IF:  You have any questions about your treatment.  You continue to have symptoms of vitamin D deficiency.  You have nausea or vomiting.  You are constipated.  You feel confused.  You have severe abdominal or back pain. MAKE   SURE YOU:  Understand these instructions.  Will watch your condition.  Will get help right away if you are not doing well or get worse. Document Released: 08/29/2011 Document Revised: 10/01/2012 Document Reviewed: 08/29/2011 [facility] Patient Information 2015 [place], [place]. This information is not intended to replace advice given to you by your health care provider. Make sure you discuss any questions you have with your health care provider. Type 1 Diabetes Mellitus, Adult Type 1  diabetes mellitus, often simply referred to as diabetes, is a long-term (chronic) disease. It occurs when the islet cells in the pancreas that make insulin (a hormone) are destroyed and can no longer make insulin. Insulin is needed to move sugars from food into the tissue cells. The tissue cells use the sugars for energy. In people with type 1 diabetes, the sugars build up in the blood instead of going into the tissue cells. As a result, high blood sugar (hyperglycemia) develops. Without insulin, the body breaks down fat cells for the needed energy. This breakdown of fat cells produces acid chemicals (ketones), which increases the acid levels in the body. The effect of either high ketone or sugar (glucose) levels can be life-threatening.  Type 1 diabetes was also previously called juvenile diabetes. It most often occurs before the age of 38, but it can occur at any age. RISK FACTORS A person is predisposed to developing type 1 diabetes if someone in his or her family has the disease and is exposed to certain additional environmental triggers.  SYMPTOMS  Symptoms of type 1 diabetes may develop gradually over days to weeks or suddenly. The symptoms occur due to hyperglycemia. The symptoms can include:   Increased thirst (polydipsia).  Increased urination (polyuria).  Increased urination during the night (nocturia).  Weight loss. This weight loss may be rapid.  Frequent, recurring infections.  Tiredness (fatigue).  Weakness.  Vision changes, such as blurred vision.  Fruity smell to your breath.  Abdominal pain.  Nausea or vomiting. DIAGNOSIS  Type 1 diabetes is diagnosed when symptoms of diabetes are present and when blood glucose levels are increased. Your blood glucose level may be checked by one or more of the following blood tests:  A fasting blood glucose test. You will not be allowed to eat for at least 8 hours before a blood sample is taken.  A random blood glucose test. Your  blood glucose is checked at any time of the day regardless of when you ate.  A hemoglobin A1c blood glucose test. A hemoglobin A1c test provides information about blood glucose control over the previous 3 months. TREATMENT  Although type 1 diabetes cannot be prevented, it can be managed with insulin, diet, and exercise.  You will need to take insulin daily to keep blood glucose in the desired range.  You will need to match insulin dosing with exercise and healthy food choices. The treatment goal is to maintain the before-meal blood sugar (preprandial glucose) level at 70-130 mg/dL.  HOME CARE INSTRUCTIONS   Have your hemoglobin A1c level checked twice a year.  Perform daily blood glucose monitoring as directed by your caregiver.  Monitor urine ketones when you are ill and as directed by your caregiver.  Take your insulin as directed by your caregiver to maintain your blood glucose level in the desired range.  Never run out of insulin. It is needed every day.  Adjust insulin based on your intake of carbohydrates. Carbohydrates can raise blood glucose levels but need to be included in your  diet. Carbohydrates provide vitamins, minerals, and fiber, which are an essential part of a healthy diet. Carbohydrates are found in fruits, vegetables, whole grains, dairy products, legumes, and foods containing added sugars.  Eat healthy foods. Alternate 3 meals with 3 snacks.  Maintain a healthy weight.  Carry a medical alert card or wear your medical alert jewelry.  Carry a 15 gram carbohydrate snack with you at all times to treat low blood glucose (hypoglycemia). Some examples of 15 gram carbohydrate snacks include:  Glucose tablets, 3 or 4.  Glucose gel, 15 gram tube.  Raisins, 2 tablespoons (24 grams).  Jelly beans, 6.  Animal crackers, 8.  Fruit juice, regular soda, or low-fat milk, 4 ounces (120 mL).  Gummy treats, 9.  Recognize hypoglycemia. Hypoglycemia occurs with blood  glucose levels of 70 mg/dL and below. The risk for hypoglycemia increases when fasting or skipping meals, during or after intense exercise, and during sleep. Hypoglycemia symptoms can include:  Tremors or shakes.  Decreased ability to concentrate.  Sweating.  Increased heart rate.  Headache.  Dry mouth.  Hunger.  Irritability.  Anxiety.  Restless sleep.  Altered speech or coordination.  Confusion.  Treat hypoglycemia promptly. If you are alert and able to safely swallow, follow the 15:15 rule:  Take 15-20 grams of rapid-acting glucose or carbohydrate. Rapid-acting options include glucose gel, glucose tablets, or 4 ounces (120 mL) of fruit juice, regular soda, or low-fat milk.  Check your blood glucose level 15 minutes after taking the glucose.  Take 15-20 grams more of glucose if the repeat blood glucose level is still 70 mg/dL or below.  Eat a meal or snack within 1 hour once blood glucose levels return to normal.  Be alert to polyuria and polydipsia, which are early signs of hyperglycemia. An early awareness of hyperglycemia allows for prompt treatment. Treat hyperglycemia as directed by your caregiver.  Engage in at least 150 minutes of moderate-intensity physical activity a week, spread over at least 3 days of the week or as directed by your caregiver.  Adjust your insulin dosing and food intake as needed if you start a new exercise or sport.  Follow your sick day plan at any time you are unable to eat or drink as usual.   Avoid tobacco use.  Limit alcohol intake to no more than 1 drink per day for nonpregnant women and 2 drinks per day for men. You should drink alcohol only when you are also eating food. Talk with your caregiver about whether alcohol is safe for you. Tell your caregiver if you drink alcohol several times a week.  Follow up with your caregiver regularly.  Schedule an eye exam within 5 years of diagnosis and then annually.  Perform daily skin  and foot care. Examine your skin and feet daily for cuts, bruises, redness, nail problems, bleeding, blisters, or sores. A foot exam by a caregiver should be done annually.  Brush your teeth and gums at least twice a day and floss at least once a day. Follow up with your dentist regularly.  Share your diabetes management plan with your workplace or school.  Stay up-to-date with immunizations.  Learn to manage stress.  Obtain ongoing diabetes education and support as needed.  Participate or seek rehabilitation as needed to maintain or improve independence and quality of life. Request a physical or occupational therapy referral if you are having foot or hand numbness or difficulties with grooming, dressing, eating, or physical activity. SEEK MEDICAL CARE IF:  You are unable to eat food or drink fluids for more than 6 hours.  You have nausea and vomiting for more than 6 hours.  Your blood glucose level is over 240 mg/dL.  There is a change in mental status.  You develop an additional serious illness.  You have diarrhea for more than 6 hours.  You have been sick or have had a fever for a couple of days and are not getting better.  You have pain during any physical activity. SEEK IMMEDIATE MEDICAL CARE IF:  You have difficulty breathing.  You have moderate to large ketone levels. MAKE SURE YOU:  Understand these instructions.  Will watch your condition.  Will get help right away if you are not doing well or get worse. Document Released: 06/03/2000 Document Revised: 06/11/2013 Document Reviewed: 01/03/2012 [facility] Patient Information 2015 [place], [place]. This information is not intended to replace advice given to you by your health care provider. Make sure you discuss any questions you have with your health care provider. Diabetes and Exercise Exercising regularly is important. It is not just about losing weight. It has many health benefits, such as:  Improving your  overall fitness, flexibility, and endurance.  Increasing your bone density.  Helping with weight control.  Decreasing your body fat.  Increasing your muscle strength.  Reducing stress and tension.  Improving your overall health. People with diabetes who exercise gain additional benefits because exercise:  Reduces appetite.  Improves the body's use of blood sugar (glucose).  Helps lower or control blood glucose.  Decreases blood pressure.  Helps control blood lipids (such as cholesterol and triglycerides).  Improves the body's use of the hormone insulin by:  Increasing the body's insulin sensitivity.  Reducing the body's insulin needs.  Decreases the risk for heart disease because exercising:  Lowers cholesterol and triglycerides levels.  Increases the levels of good cholesterol (such as high-density lipoproteins [HDL]) in the body.  Lowers blood glucose levels. YOUR ACTIVITY PLAN  Choose an activity that you enjoy and set realistic goals. Your health care provider or diabetes educator can help you make an activity plan that works for you. You can break activities into 2 or 3 sessions throughout the day. Doing so is as good as one long session. Exercise ideas include:  Taking the dog for a walk.  Taking the stairs instead of the elevator.  Dancing to your favorite song.  Doing your favorite exercise with a friend. RECOMMENDATIONS FOR EXERCISING WITH TYPE 1 OR TYPE 2 DIABETES   Check your blood glucose before exercising. If blood glucose levels are greater than 240 mg/dL, check for urine ketones. Do not exercise if ketones are present.  Avoid injecting insulin into areas of the body that are going to be exercised. For example, avoid injecting insulin into:  The arms when playing tennis.  The legs when jogging.  Keep a record of:  Food intake before and after you exercise.  Expected peak times of insulin action.  Blood glucose levels before and after you  exercise.  The type and amount of exercise you have done.  Review your records with your health care provider. Your health care provider will help you to develop guidelines for adjusting food intake and insulin amounts before and after exercising.  If you take insulin or oral hypoglycemic agents, watch for signs and symptoms of hypoglycemia. They include:  Dizziness.  Shaking.  Sweating.  Chills.  Confusion.  Drink plenty of water while you exercise to prevent dehydration or  heat stroke. Body water is lost during exercise and must be replaced.  Talk to your health care provider before starting an exercise program to make sure it is safe for you. Remember, almost any type of activity is better than none. Document Released: 08/27/2003 Document Revised: 02/06/2013 Document Reviewed: 11/13/2012 [facility] Patient Information 2015 [place], [place]. This information is not intended to replace advice given to you by your health care provider. Make sure you discuss any questions you have with your health care provider.

## 2013-12-11 NOTE — Progress Notes (Signed)
Patient ID: Gregory Hatfield, male   DOB: 1926/05/05, 80 y.o.   MRN: [id number]   Annual Screening Comprehensive Examination  This very nice 79 y.o.[facility] presents for complete physical.  Patient has been followed for HTN, T1_IDDM, GERD, BPH,SDAT,  Hyperlipidemia, and Vitamin D Deficiency. Currently patient is fairly limited by severe Lt hip pains and is scheduled for THR in the near future by Dr French Ana.   HTN predates since 89. Patient's BP has been controlled at home and today's BP: 142/62 mmHg. Patient denies any cardiac symptoms as chest pain, palpitations, shortness of breath, dizziness or ankle swelling.   Patient's hyperlipidemia is controlled with diet and medications. Patient denies myalgias or other medication SE's. Last lipoids at goal as below in Mar 2015.  Lab Results  Component Value Date   CHOL 141 09/10/2013   HDL 45 09/10/2013   LDLCALC 80 09/10/2013   TRIG 80 09/10/2013   CHOLHDL 3.1 09/10/2013    Patient has T1_IDDM since with Stage 3 CKD (GFR 44 ml/min) and Periph. Neuropathy.  His last A1c was 8.2% in Mar 2015 and is monitored by Dr Loanne Drilling who prudently feels due to patient's dementia that "loose control"  To avoid complicating his regimen is judicious to avoid hypoglycemia. Patient denies reactive hypoglycemic symptoms, visual blurring, diabetic polys, or paresthesias.   Finally, patient has history of Vitamin D Deficiency of  28 in 2008 and last vitamin D was 78 in Dec 2014  Medication Sig  . TYLENOL ARTHRITIS PAIN 650 MG CR Take 1,300 mg by mouth every 4 (four) hours as needed  . aspirin 81 MG EC tablet Take 81 mg by mouth daily.   . carvedilol  12.5 MG tablet Take 12.5 mg by mouth 2 (two) times daily   . VITAMIN D 5000 UNITS  Take 1 capsule by mouth daily.   Marland Kitchen DILACOR XR 240 MG 24 hr cap Take 240 mg by mouth daily. For blood pressure  . furosemide  40 MG tablet Take 40 mg by mouth 2 (two) times daily. For fluid retention  . ONE TOUCH ULTRA TESTstrip Use as directed to  check blood sugar 6 times a day dx 250.43  . hydrALAZINE  25 MG tab Take 25 mg by mouth 2 (two) times daily. For blood pressure  . LEVEMIR FLEXTOUCH)  Pen Inject 60 Units into the skin every morning.   . Insulin Pen Needle (B-D UF III MINI PEN NEEDLES) 31G X 5 MM MISC CHECK BLOOD SUGAR 4 TIMES DAILY  . LUMIGAN 0.01 % SOLN Place 1 drop into the right eye at bedtime.   . magnesium gluconate  500 MG tablet Take 250 mg by mouth daily.  Marland Kitchen MYRBETRIQ 25 MG Take 25 mg by mouth daily. For bladder  . MULTIVITAMIN Take 1 tablet by mouth daily.    . pravastatin  40 MG tab Take 40 mg by mouth daily. For cholesterol  . SIMBRINZA 1-0.2 % SUSP Place 1 drop into both eyes 3 (three) times daily.   Allergies  Allergen Reactions  . Ace Inhibitors Other (See Comments)    Reaction unknown  . Demerol Other (See Comments)    Reaction unknown  . Flomax [Tamsulosin Hcl] Other (See Comments)    Reaction unknown  . Morphine And Related Other (See Comments)    Reaction unknown  . Oxytrol [Oxybutynin] Rash    Past Medical History  Diagnosis Date  . Diabetes mellitus     insulin-dependent  . CHF (congestive heart failure)   .  GERD (gastroesophageal reflux disease)   . Hypertension   . Dyslipidemia   . Glaucoma   . CVA (cerebral infarction)     Left Eye  . Diabetic retinopathy   . CKD (chronic kidney disease)   . BPH (benign prostatic hyperplasia)   . History of nuclear stress test 03/2004    inf wall scar from base to apex & anterolateral wall ischemia  . Hyperlipidemia   . Vitamin D deficiency     Past Surgical History  Procedure Laterality Date  . Cardiac catheterization  04/2004    normal r-sided pressure, mild pulm HTN  . Prostate surgery  10/2010    transurethral resection   . Transthoracic echocardiogram  05/2006    EF 60-70%; mild calcif of MV; mild MV regurg; LA mildly dilated    Family History  Problem Relation Age of Onset  . Diabetes Mother   . Hypertension Mother   . Prostate  cancer Father   . Heart disease Father   . Diabetes Brother   . Cancer Brother   . Heart disease Brother   . Diabetes Sister   . Heart disease Sister   . Hyperlipidemia Child   . Hypertension Child   . Diabetes Child   . Heart attack Child   . Cancer Child     History   Social History  . Marital Status: Widowed    Spouse Name: N/A    Number of Children: 66  . Years of Education: master's   Occupational History  . teacher/instructor    Social History Main Topics  . Smoking status: Former Smoker    Quit date: 06/21/1983  . Smokeless tobacco: Never Used  . Alcohol Use: Yes     Comment: wine every now and then  . Drug Use: No  . Sexual Activity: None     ROS Constitutional: Denies fever, chills, weight loss/gain, headaches, insomnia, fatigue, night sweats or change in appetite. Eyes: Denies redness, blurred vision, diplopia, discharge, itchy or watery eyes.  ENT: Denies discharge, congestion, post nasal drip, epistaxis, sore throat, earache, hearing loss, dental pain, Tinnitus, Vertigo, Sinus pain or snoring.  Cardio: Denies chest pain, palpitations, irregular heartbeat, syncope, dyspnea, diaphoresis, orthopnea, PND, claudication or edema Respiratory: denies cough, dyspnea, DOE, pleurisy, hoarseness, laryngitis or wheezing.  Gastrointestinal: Denies dysphagia, heartburn, reflux, water brash, pain, cramps, nausea, vomiting, bloating, diarrhea, constipation, hematemesis, melena, hematochezia, jaundice or hemorrhoids. Genitourinary: Denies dysuria, frequency, urgency, nocturia, hesitancy, discharge, hematuria or flank pain Musculoskeletal: Reports pains Lt hip limiting activities and associated with severe pain at rest and withany activities as walking. Denies falls. Skin: Denies puritis, rash, hives, warts, acne, eczema or change in skin lesion Neuro: No weakness, tremor, incoordination, spasms, paresthesia or pain,but does admit decreased sensation in the bilat. lower  extremities.   Psychiatric: Denies confusion, memory loss. Denies depression. Endocrine: Denies change in weight, skin, hair change, nocturia, and paresthesia, diabetic polys, visual blurring or hyper / hypo glycemic episodes.  Heme/Lymph: No excessive bleeding, bruising or enlarged lymph nodes.  Physical Exam  BP 142/62  P  60  T 97.9 F   Resp 18  Ht 5\' 10"    Wt 206 lb 9.6 oz   BMI 29.64 kg/m2  General Appearance: Well nourished, in no apparent distress. Eyes: PERRLA, EOMs, conjunctiva no swelling or erythema, normal fundi and vessels. Sinuses: No frontal/maxillary tenderness ENT/Mouth: EACs patent / TMs  nl. Nares clear without erythema, swelling, mucoid exudates. Oral hygiene is good. No erythema, swelling, or exudate.  Tongue normal, non-obstructing. Tonsils not swollen or erythematous. Hearing normal.  Neck: Supple, thyroid normal. No bruits, nodes or JVD. Respiratory: Respiratory effort normal.  BS equal and clear bilateral without rales, rhonci, wheezing or stridor. Cardio: Heart sounds are normal with regular rate and rhythm and no murmurs, rubs or gallops. Peripheral pulses are normal and equal bilaterally without edema. No aortic or femoral bruits. Chest: symmetric with normal excursions and percussion.  Abdomen: Flat, soft, with bowl sounds. Nontender, no guarding, rebound, hernias, masses, or organomegaly.  Lymphatics: Non tender without lymphadenopathy.  Genitourinary: No hernias.Testes nl. DRE - prostate nl for age - smooth & firm w/o nodules. Musculoskeletal: Bilat crepitus of knees (Rt>Lt). Painful Lt Hil rotation. Limping broad- based gait. Skin: Warm and dry without rashes, lesions, cyanosis, clubbing or  ecchymosis.  Neuro: Cranial nerves intact. DTR's 1+ Bilat in UE's an Absent Bilat in LE's. Normal muscle tone, no cerebellar symptoms. Sensation decreased in distal LE's in a stocking distribution.  Pysch: Awake and oriented X 3 with normal affect, and limited insight  and concrete abstractions and impaired judgement.  Assessment and Plan  1. Annual Screening Examination 2. Hypertension  3. Hyperlipidemia 4. T1_IDDM w/ CKD 5. T1_IDDM w/Periph.Neuropathy 6. Vitamin D Deficiency 7. SDAT 8. OAB 9. GERD 10. BPH  Continue prudent diet as discussed, weight control, BP monitoring, regular exercise, and medications as discussed.  Discussed med effects and SE's. Routine screening labs and tests as requested with regular follow-up as recommended.

## 2013-12-12 ENCOUNTER — Ambulatory Visit (INDEPENDENT_AMBULATORY_CARE_PROVIDER_SITE_OTHER): Payer: Medicare PPO | Admitting: Internal Medicine

## 2013-12-12 ENCOUNTER — Encounter: Payer: Self-pay | Admitting: Internal Medicine

## 2013-12-12 VITALS — BP 142/64 | HR 60 | Ht 72.0 in | Wt 205.7 lb

## 2013-12-12 DIAGNOSIS — Z01810 Encounter for preprocedural cardiovascular examination: Secondary | ICD-10-CM

## 2013-12-12 LAB — RPR

## 2013-12-12 LAB — PSA: PSA: 2.19 ng/mL (ref <=4.00)

## 2013-12-12 LAB — VITAMIN D 25 HYDROXY (VIT D DEFICIENCY, FRACTURES): Vit D, 25-Hydroxy: 62 ng/mL (ref 30–89)

## 2013-12-12 LAB — URINALYSIS, MICROSCOPIC ONLY
Bacteria, UA: NONE SEEN
CRYSTALS: NONE SEEN
Casts: NONE SEEN
Squamous Epithelial / LPF: NONE SEEN

## 2013-12-12 LAB — MICROALBUMIN / CREATININE URINE RATIO
Creatinine, Urine: 161.3 mg/dL
Microalb Creat Ratio: 8 mg/g (ref 0.0–30.0)
Microalb, Ur: 1.29 mg/dL (ref 0.00–1.89)

## 2013-12-12 LAB — HIV ANTIBODY (ROUTINE TESTING W REFLEX): HIV 1&2 Ab, 4th Generation: NONREACTIVE

## 2013-12-12 NOTE — Patient Instructions (Signed)
Your physician wants you to follow-up in: 1 year. You will receive a reminder letter in the mail two months in advance. If you don't receive a letter, please call our office to schedule the follow-up appointment.  You are cleared for surgery. Please hold aspirin 7 days prior to your surgery.

## 2013-12-16 ENCOUNTER — Encounter: Payer: Self-pay | Admitting: Endocrinology

## 2013-12-16 ENCOUNTER — Ambulatory Visit (INDEPENDENT_AMBULATORY_CARE_PROVIDER_SITE_OTHER): Payer: Medicare PPO | Admitting: Endocrinology

## 2013-12-16 VITALS — BP 120/62 | HR 64 | Temp 98.8°F | Ht 72.0 in | Wt 204.0 lb

## 2013-12-16 DIAGNOSIS — E1029 Type 1 diabetes mellitus with other diabetic kidney complication: Secondary | ICD-10-CM

## 2013-12-16 NOTE — Progress Notes (Signed)
Subjective:    Patient ID: Gregory Hatfield, male    DOB: 1926/04/28, 80 y.o.   MRN: [id number]  HPI Pt returns for f/u of insulin-requiring DM (dx'ed 1975, when he presented with dizziness; he has mild if any neuropathy of the lower extremities; he has associated renal insuff and CHF; he has been on insulin since 2003; in 2008, he was changed to a simple BID insulin regimen, due to noncompliance with multiple daily injections; he has never had pancreatitis, severe hypoglycemia, or DKA; in november of 2014, pt had an episode of hypoglycemia in the evening, while driving--he was able to pull over, though; this happend after he missed a meal). Since last ov, he had several more episodes of hypoglycemia, and these are mild.  they happened after a meal was missed.  he brings a record of his cbg's which i have reviewed today.  It varies from 58-200.  There is no trend throughout the day.    Past Medical History  Diagnosis Date  . Diabetes mellitus     insulin-dependent  . CHF (congestive heart failure)   . GERD (gastroesophageal reflux disease)   . Hypertension   . Dyslipidemia   . Glaucoma   . CVA (cerebral infarction)     Left Eye  . Diabetic retinopathy   . CKD (chronic kidney disease)   . BPH (benign prostatic hyperplasia)   . History of nuclear stress test 03/2004    inf wall scar from base to apex & anterolateral wall ischemia  . Hyperlipidemia   . Vitamin D deficiency     Past Surgical History  Procedure Laterality Date  . Cardiac catheterization  04/2004    normal r-sided pressure, mild pulm HTN  . Prostate surgery  10/2010    transurethral resection   . Transthoracic echocardiogram  05/2006    EF 60-70%; mild calcif of MV; mild MV regurg; LA mildly dilated    History   Social History  . Marital Status: Widowed    Spouse Name: N/A    Number of Children: 30  . Years of Education: master's   Occupational History  . teacher/instructor    Social History Main Topics  .  Smoking status: Former Smoker    Quit date: 06/21/1983  . Smokeless tobacco: Never Used  . Alcohol Use: Yes     Comment: wine every now and then  . Drug Use: No  . Sexual Activity: Not on file   Other Topics Concern  . Not on file   Social History Narrative  . No narrative on file    Current Outpatient Prescriptions on File Prior to Visit  Medication Sig Dispense Refill  . acetaminophen (TYLENOL ARTHRITIS PAIN) 650 MG CR tablet Take 1,300 mg by mouth every 4 (four) hours as needed for pain.       Marland Kitchen aspirin 81 MG EC tablet Take 81 mg by mouth daily.       . carvedilol (COREG) 12.5 MG tablet Take 12.5 mg by mouth 2 (two) times daily with a meal. For blood pressure      . Cholecalciferol (VITAMIN D3) 5000 UNITS CAPS Take 1 capsule by mouth daily.       Marland Kitchen diltiazem (CARDIZEM CD) 240 MG 24 hr capsule TAKE (1) CAPSULE DAILY FOR HIGH BLOOD PRESSURE.  30 capsule  3  . diltiazem (DILACOR XR) 240 MG 24 hr capsule Take 240 mg by mouth daily. For blood pressure      . furosemide (LASIX) 40  MG tablet Take 40 mg by mouth 2 (two) times daily. For fluid retention      . glucose blood (ONE TOUCH ULTRA TEST) test strip Use as directed to check blood sugar 6 times a day dx 250.43  200 each  5  . hydrALAZINE (APRESOLINE) 25 MG tablet Take 25 mg by mouth 2 (two) times daily. For blood pressure      . Insulin Detemir (LEVEMIR FLEXTOUCH) 100 UNIT/ML Pen Inject 55 Units into the skin every morning.       . Insulin Pen Needle (B-D UF III MINI PEN NEEDLES) 31G X 5 MM MISC CHECK BLOOD SUGAR 4 TIMES DAILY  100 each  4  . lisinopril (PRINIVIL,ZESTRIL) 20 MG tablet       . LUMIGAN 0.01 % SOLN Place 1 drop into the right eye at bedtime.       . magnesium gluconate (MAGONATE) 500 MG tablet Take 250 mg by mouth daily.      . Mirabegron ER (MYRBETRIQ) 25 MG TB24 Take 25 mg by mouth daily. For bladder      . Multiple Vitamin (MULTIVITAMIN) tablet Take 1 tablet by mouth daily.        . pravastatin (PRAVACHOL) 40 MG  tablet Take 40 mg by mouth daily. For cholesterol      . SIMBRINZA 1-0.2 % SUSP Place 1 drop into both eyes 3 (three) times daily.       No current facility-administered medications on file prior to visit.    Allergies  Allergen Reactions  . Ace Inhibitors Other (See Comments)    Reaction unknown  . Demerol Other (See Comments)    Reaction unknown  . Flomax [Tamsulosin Hcl] Other (See Comments)    Reaction unknown  . Morphine And Related Other (See Comments)    Reaction unknown  . Oxytrol [Oxybutynin] Hatfield    Family History  Problem Relation Age of Onset  . Diabetes Mother   . Hypertension Mother   . Prostate cancer Father   . Heart disease Father   . Diabetes Brother   . Cancer Brother   . Heart disease Brother   . Diabetes Sister   . Heart disease Sister   . Hyperlipidemia Child   . Hypertension Child   . Diabetes Child   . Heart attack Child   . Cancer Child     BP 120/62  Pulse 64  Temp(Src) 98.8 F (37.1 C) (Oral)  Ht 6' (1.829 m)  Wt 204 lb (92.534 kg)  BMI 27.66 kg/m2  SpO2 95%  Review of Systems Denies LOC and weight change.      Objective:   Physical Exam VITAL SIGNS:  See vs page GENERAL: no distress SKIN:  Insulin injection sites at the anterior abdomen are normal  Lab Results  Component Value Date   HGBA1C 7.7* 12/11/2013      Assessment & Plan:  DM: mild exacerbation.  Frail elderly state: in this setting, DM is slightly overcontrolled.      Patient is advised the following: Patient Instructions  Please come back for a follow-up appointment in 3 months. check your blood sugar 6 times a day--before the 3 meals, and at bedtime.  also check if you have symptoms of your blood sugar being too high or too low.  please keep a record of the readings and bring it to your next appointment here.  please call us sooner if you are having low blood sugar episodes.   On this type of  insulin schedule, you should eat meals on a regular schedule.  If a  meal is missed or significantly delayed, your blood sugar could go low. To avoid the lows, carry some packets of cheese and crackers.  This way, you can avoid the lows.   Please reduce the insulin to 55 units each morning.

## 2013-12-16 NOTE — Patient Instructions (Addendum)
Please come back for a follow-up appointment in 3 months. check your blood sugar 6 times a day--before the 3 meals, and at bedtime.  also check if you have symptoms of your blood sugar being too high or too low.  please keep a record of the readings and bring it to your next appointment here.  please call us sooner if you are having low blood sugar episodes.   On this type of insulin schedule, you should eat meals on a regular schedule.  If a meal is missed or significantly delayed, your blood sugar could go low. To avoid the lows, carry some packets of cheese and crackers.  This way, you can avoid the lows.   Please reduce the insulin to 55 units each morning.

## 2013-12-17 ENCOUNTER — Encounter: Payer: Self-pay | Admitting: Internal Medicine

## 2013-12-17 NOTE — Progress Notes (Signed)
OFFICE NOTE  Chief Complaint:  Routine followup  Primary Care Physician: Alesia Richards, MD  HPI:  Gregory Hatfield is an 80 year old gentleman who I saw last November of 2012 with a history of hypertension, chronic kidney disease, insulin-dependent diabetes, and dyslipidemia. He has continued to describe no anginal symptoms or worsening shortness of breath. He does have a history of cardiac catheterization in 2005 which was normal, and his main complaints today are left shoulder pain as well as some leg or hip pain. Denies any chest pain, shortness of breath, palpitations, presyncope or syncopal symptoms. He recently reports problems with his hips and says that he may need to have hip replacement. He occasionally has some lower extremity swelling denies any chest pain or worsening shortness of breath.  [name] returns today for preoperative evaluation prior to left hip or placement in August. He is generally asymptomatic and denies any chest pain or shortness of breath. He is able to do some activities although is limited by his hip.  PMHx:  Past Medical History  Diagnosis Date  . Diabetes mellitus     insulin-dependent  . CHF (congestive heart failure)   . GERD (gastroesophageal reflux disease)   . Hypertension   . Dyslipidemia   . Glaucoma   . CVA (cerebral infarction)     Left Eye  . Diabetic retinopathy   . CKD (chronic kidney disease)   . BPH (benign prostatic hyperplasia)   . History of nuclear stress test 03/2004    inf wall scar from base to apex & anterolateral wall ischemia  . Hyperlipidemia   . Vitamin D deficiency     Past Surgical History  Procedure Laterality Date  . Cardiac catheterization  04/2004    normal r-sided pressure, mild pulm HTN  . Prostate surgery  10/2010    transurethral resection   . Transthoracic echocardiogram  05/2006    EF 60-70%; mild calcif of MV; mild MV regurg; LA mildly dilated    FAMHx:  Family History  Problem  Relation Age of Onset  . Diabetes Mother   . Hypertension Mother   . Prostate cancer Father   . Heart disease Father   . Diabetes Brother   . Cancer Brother   . Heart disease Brother   . Diabetes Sister   . Heart disease Sister   . Hyperlipidemia Child   . Hypertension Child   . Diabetes Child   . Heart attack Child   . Cancer Child     SOCHx:   reports that he quit smoking about 30 years ago. He has never used smokeless tobacco. He reports that he drinks alcohol. He reports that he does not use illicit drugs.  ALLERGIES:  Allergies  Allergen Reactions  . Ace Inhibitors Other (See Comments)    Reaction unknown  . Demerol Other (See Comments)    Reaction unknown  . Flomax [Tamsulosin Hcl] Other (See Comments)    Reaction unknown  . Morphine And Related Other (See Comments)    Reaction unknown  . Oxytrol [Oxybutynin] Rash    ROS: A comprehensive review of systems was negative except for: Musculoskeletal: positive for myalgias and stiff joints  HOME MEDS: Current Outpatient Prescriptions  Medication Sig Dispense Refill  . acetaminophen (TYLENOL ARTHRITIS PAIN) 650 MG CR tablet Take 1,300 mg by mouth every 4 (four) hours as needed for pain.       Marland Kitchen aspirin 81 MG EC tablet Take 81 mg by mouth daily.       Marland Kitchen  carvedilol (COREG) 12.5 MG tablet Take 12.5 mg by mouth 2 (two) times daily with a meal. For blood pressure      . Cholecalciferol (VITAMIN D3) 5000 UNITS CAPS Take 1 capsule by mouth daily.       Marland Kitchen diltiazem (CARDIZEM CD) 240 MG 24 hr capsule TAKE (1) CAPSULE DAILY FOR HIGH BLOOD PRESSURE.  30 capsule  3  . diltiazem (DILACOR XR) 240 MG 24 hr capsule Take 240 mg by mouth daily. For blood pressure      . furosemide (LASIX) 40 MG tablet Take 40 mg by mouth 2 (two) times daily. For fluid retention      . glucose blood (ONE TOUCH ULTRA TEST) test strip Use as directed to check blood sugar 6 times a day dx 250.43  200 each  5  . hydrALAZINE (APRESOLINE) 25 MG tablet Take 25  mg by mouth 2 (two) times daily. For blood pressure      . Insulin Detemir (LEVEMIR FLEXTOUCH) 100 UNIT/ML Pen Inject 55 Units into the skin every morning.       . Insulin Pen Needle (B-D UF III MINI PEN NEEDLES) 31G X 5 MM MISC CHECK BLOOD SUGAR 4 TIMES DAILY  100 each  4  . lisinopril (PRINIVIL,ZESTRIL) 20 MG tablet       . LUMIGAN 0.01 % SOLN Place 1 drop into the right eye at bedtime.       . magnesium gluconate (MAGONATE) 500 MG tablet Take 250 mg by mouth daily.      . Mirabegron ER (MYRBETRIQ) 25 MG TB24 Take 25 mg by mouth daily. For bladder      . Multiple Vitamin (MULTIVITAMIN) tablet Take 1 tablet by mouth daily.        . pravastatin (PRAVACHOL) 40 MG tablet Take 40 mg by mouth daily. For cholesterol      . SIMBRINZA 1-0.2 % SUSP Place 1 drop into both eyes 3 (three) times daily.       No current facility-administered medications for this visit.    LABS/IMAGING: No results found for this or any previous visit (from the past 48 hour(s)). No results found.  VITALS: BP 142/64  Pulse 60  Ht 6' (1.829 m)  Wt 205 lb 11.2 oz (93.305 kg)  BMI 27.89 kg/m2  EXAM: General appearance: alert and no distress Neck: no adenopathy, no carotid bruit, no JVD, supple, symmetrical, trachea midline and thyroid not enlarged, symmetric, no tenderness/mass/nodules Lungs: clear to auscultation bilaterally Heart: regular rate and rhythm, S1, S2 normal, no murmur, click, rub or gallop Abdomen: soft, non-tender; bowel sounds normal; no masses,  no organomegaly Extremities: extremities normal, atraumatic, no cyanosis or edema Pulses: 2+ and symmetric Skin: Skin color, texture, turgor normal. No rashes or lesions Neurologic: Grossly normal  EKG: Sinus rhythm with PAC's at 60  ASSESSMENT: 1. Type 1 diabetes 2. Hypertension 3. Chronic kidney disease 4. Dyslipidemia 5. Low risk for surgery.  PLAN: 1.   [name] has no cardiac complaints at this time other than problems with his hip. He is  contemplating surgery for this if it has not improved. From a cardiac standpoint I believe he is low risk for his upcoming hip replacement surgery. No further testing would be necessary at this time.  He should hold aspirin 7 days prior to surgery.  Pixie Casino, MD, [facility] Attending Cardiologist [facility] 12/17/2013, 6:01 PM

## 2013-12-18 ENCOUNTER — Telehealth: Payer: Self-pay | Admitting: *Deleted

## 2013-12-18 NOTE — Telephone Encounter (Signed)
Faxed cardiac clearance with most recent EKG and OV note (12/12/13). OK to hold ASA for 7 days prior to surgery and restart after.

## 2013-12-24 ENCOUNTER — Other Ambulatory Visit: Payer: Self-pay | Admitting: Endocrinology

## 2013-12-25 ENCOUNTER — Other Ambulatory Visit (INDEPENDENT_AMBULATORY_CARE_PROVIDER_SITE_OTHER): Payer: Medicare PPO

## 2013-12-25 DIAGNOSIS — Z1212 Encounter for screening for malignant neoplasm of rectum: Secondary | ICD-10-CM

## 2013-12-25 LAB — POC HEMOCCULT BLD/STL (HOME/3-CARD/SCREEN)
Card #2 Fecal Occult Blod, POC: NEGATIVE
Card #3 Fecal Occult Blood, POC: NEGATIVE
Fecal Occult Blood, POC: NEGATIVE

## 2013-12-30 ENCOUNTER — Other Ambulatory Visit: Payer: Self-pay | Admitting: Physician Assistant

## 2014-01-06 ENCOUNTER — Encounter (HOSPITAL_COMMUNITY): Payer: Self-pay | Admitting: Pharmacy Technician

## 2014-01-08 ENCOUNTER — Other Ambulatory Visit: Payer: Self-pay | Admitting: Physician Assistant

## 2014-01-08 NOTE — Pre-Procedure Instructions (Signed)
Garrus Rolley Skates  01/08/2014   Your procedure is scheduled on:  Friday, July 31  Report to [facility] Main Entrance "A" at 5:30  AM.  Call this number if you have problems the morning of surgery: [phone]   Remember:   Do not eat food or drink liquids after midnight.   Take these medicines the morning of surgery with A SIP OF WATER: tylenol arthritis pain, carvedilol (coreg), hydralazine (apresoline), simbrinza eye drops, mirabegron   Do not wear jewelry, make-up or nail polish.  Do not wear lotions, powders, or perfumes. You may wear deodorant.  Do not shave 48 hours prior to surgery. Men may shave face and neck.  Do not bring valuables to the hospital.  [facility] is not responsible for any belongings or valuables.               Contacts, dentures or bridgework may not be worn into surgery.  Leave suitcase in the car. After surgery it may be brought to your room.  For patients admitted to the hospital, discharge time is determined by your  treatment team.               Patients discharged the day of surgery will not be allowed to drive home.  Name and phone number of your driver:   Special Instructions: review handout Preparing for Surgery   Please read over the following fact sheets that you were given: Pain Booklet, Coughing and Deep Breathing, Blood Transfusion Information, MRSA Information and Surgical Site Infection Prevention

## 2014-01-09 ENCOUNTER — Encounter (HOSPITAL_COMMUNITY)
Admission: RE | Admit: 2014-01-09 | Discharge: 2014-01-09 | Disposition: A | Discharge status: Home / Self Care | Payer: Medicare PPO | Source: Ambulatory Visit | Attending: Orthopedic Surgery | Admitting: Orthopedic Surgery

## 2014-01-09 ENCOUNTER — Encounter (HOSPITAL_COMMUNITY): Payer: Self-pay

## 2014-01-09 DIAGNOSIS — A4902 Methicillin resistant Staphylococcus aureus infection, unspecified site: Secondary | ICD-10-CM | POA: Diagnosis not present

## 2014-01-09 DIAGNOSIS — Z01818 Encounter for other preprocedural examination: Secondary | ICD-10-CM | POA: Diagnosis not present

## 2014-01-09 DIAGNOSIS — Z01812 Encounter for preprocedural laboratory examination: Secondary | ICD-10-CM | POA: Insufficient documentation

## 2014-01-09 LAB — URINALYSIS, ROUTINE W REFLEX MICROSCOPIC
Bilirubin Urine: NEGATIVE
Glucose, UA: 100 mg/dL — AB
HGB URINE DIPSTICK: NEGATIVE
Ketones, ur: NEGATIVE mg/dL
LEUKOCYTES UA: NEGATIVE
NITRITE: NEGATIVE
Protein, ur: NEGATIVE mg/dL
SPECIFIC GRAVITY, URINE: 1.024 (ref 1.005–1.030)
UROBILINOGEN UA: 1 mg/dL (ref 0.0–1.0)
pH: 6 (ref 5.0–8.0)

## 2014-01-09 LAB — COMPREHENSIVE METABOLIC PANEL
ALT: 22 U/L (ref 0–53)
ANION GAP: 11 (ref 5–15)
AST: 22 U/L (ref 0–37)
Albumin: 3.8 g/dL (ref 3.5–5.2)
Alkaline Phosphatase: 78 U/L (ref 39–117)
BILIRUBIN TOTAL: 0.4 mg/dL (ref 0.3–1.2)
BUN: 34 mg/dL — AB (ref 6–23)
CHLORIDE: 105 meq/L (ref 96–112)
CO2: 28 meq/L (ref 19–32)
CREATININE: 1.59 mg/dL — AB (ref 0.50–1.35)
Calcium: 9 mg/dL (ref 8.4–10.5)
GFR calc Af Amer: 44 mL/min — ABNORMAL LOW (ref >90)
GFR, EST NON AFRICAN AMERICAN: 38 mL/min — AB (ref >90)
GLUCOSE: 248 mg/dL — AB (ref 70–99)
Potassium: 4.4 mEq/L (ref 3.7–5.3)
Sodium: 144 mEq/L (ref 137–147)
Total Protein: 7.2 g/dL (ref 6.0–8.3)

## 2014-01-09 LAB — CBC WITH DIFFERENTIAL/PLATELET
Basophils Absolute: 0 10*3/uL (ref 0.0–0.1)
Basophils Relative: 1 % (ref 0–1)
Eosinophils Absolute: 0.1 10*3/uL (ref 0.0–0.7)
Eosinophils Relative: 2 % (ref 0–5)
HEMATOCRIT: 39.9 % (ref 39.0–52.0)
HEMOGLOBIN: 12.8 g/dL — AB (ref 13.0–17.0)
LYMPHS PCT: 40 % (ref 12–46)
Lymphs Abs: 2.3 10*3/uL (ref 0.7–4.0)
MCH: 29.2 pg (ref 26.0–34.0)
MCHC: 32.1 g/dL (ref 30.0–36.0)
MCV: 90.9 fL (ref 78.0–100.0)
MONO ABS: 0.5 10*3/uL (ref 0.1–1.0)
MONOS PCT: 8 % (ref 3–12)
Neutro Abs: 2.8 10*3/uL (ref 1.7–7.7)
Neutrophils Relative %: 49 % (ref 43–77)
Platelets: 178 10*3/uL (ref 150–400)
RBC: 4.39 MIL/uL (ref 4.22–5.81)
RDW: 14 % (ref 11.5–15.5)
WBC: 5.7 10*3/uL (ref 4.0–10.5)

## 2014-01-09 LAB — SURGICAL PCR SCREEN
MRSA, PCR: POSITIVE — AB
Staphylococcus aureus: POSITIVE — AB

## 2014-01-09 LAB — PROTIME-INR
INR: 1.16 (ref 0.00–1.49)
Prothrombin Time: 14.8 seconds (ref 11.6–15.2)

## 2014-01-09 LAB — ABO/RH: ABO/RH(D): O POS

## 2014-01-09 LAB — APTT: aPTT: 37 seconds (ref 24–37)

## 2014-01-09 NOTE — Progress Notes (Signed)
Mupirocin Ointment Rx called into [facility] for positive PCR of MRSA and Staph. Left message on pt's home voicemail.

## 2014-01-10 LAB — URINE CULTURE

## 2014-01-13 ENCOUNTER — Other Ambulatory Visit: Payer: Self-pay | Admitting: Internal Medicine

## 2014-01-13 ENCOUNTER — Other Ambulatory Visit: Payer: Self-pay | Admitting: Endocrinology

## 2014-01-15 ENCOUNTER — Ambulatory Visit: Payer: Self-pay | Admitting: Internal Medicine

## 2014-01-15 ENCOUNTER — Telehealth: Payer: Self-pay | Admitting: *Deleted

## 2014-01-15 NOTE — Telephone Encounter (Signed)
Faxed clearance for left total hip replacement (Dr. Keturah Barre. [place]) and hold ASA 7 days prior and restart after, along with last OV note and EKG

## 2014-01-16 ENCOUNTER — Other Ambulatory Visit: Payer: Self-pay | Admitting: Physician Assistant

## 2014-01-16 MED ORDER — ACETAMINOPHEN 500 MG PO TABS
1000.0000 mg | ORAL_TABLET | Freq: Once | ORAL | Status: DC
  Filled 2014-01-16: qty 2

## 2014-01-16 MED ORDER — CHLORHEXIDINE GLUCONATE 4 % EX LIQD
60.0000 mL | Freq: Once | CUTANEOUS | Status: DC
  Filled 2014-01-16: qty 60

## 2014-01-16 MED ORDER — SODIUM CHLORIDE 0.9 % IV SOLN: INTRAVENOUS | Status: DC

## 2014-01-16 MED ORDER — CEFAZOLIN SODIUM-DEXTROSE 2-3 GM-% IV SOLR
2.0000 g | INTRAVENOUS | Status: AC
  Administered 2014-01-17: 2 g via INTRAVENOUS
  Filled 2014-01-16: qty 50

## 2014-01-16 NOTE — H&P (Signed)
TOTAL HIP ADMISSION H&P  Patient is admitted for left total hip arthroplasty.  Subjective:  Chief Complaint: left hip pain  HPI: Gregory Hatfield, 80 y.o. male, has a history of pain and functional disability in the left hip(s) due to arthritis and patient has failed non-surgical conservative treatments for greater than 12 weeks to include NSAID's and/or analgesics, corticosteriod injections, use of assistive devices and activity modification.  Onset of symptoms was gradual starting 10 years ago with gradually worsening course since that time.The patient noted no past surgery on the left hip(s).  Patient currently rates pain in the left hip at 8 out of 10 with activity. Patient has night pain, worsening of pain with activity and weight bearing, trendelenberg gait, pain that interfers with activities of daily living and pain with passive range of motion. Patient has evidence of periarticular osteophytes and joint space narrowing by imaging studies. This condition presents safety issues increasing the risk of falls. There is no current active infection.  Patient Active Problem List   Diagnosis Date Noted  . T1 IDDM w/ Neuropathy 10/23/2013  . Encounter for long-term (current) use of other medications 10/23/2013  . Chronic renal insufficiency, stage III (moderate) 06/18/2013  . Vitamin D Deficiency 06/16/2013  . Alzheimer's disease 06/16/2013  . T1 IDDM w/Stage 3 CKD (GFR 44 ml/min) 06/16/2013  . BPH (benign prostatic hyperplasia) 07/01/2011  . Hyperlipidemia 12/08/2009  . Open-angle glaucoma (365.10) 12/08/2009  . OVERACTIVE BLADDER 12/08/2009  . Hypertension 01/10/2007  . GERD 01/10/2007   Past Medical History  Diagnosis Date  . Diabetes mellitus     insulin-dependent  . CHF (congestive heart failure)   . GERD (gastroesophageal reflux disease)   . Hypertension   . Dyslipidemia   . Glaucoma   . CVA (cerebral infarction)     Left Eye  . Diabetic retinopathy   . CKD (chronic kidney  disease)   . BPH (benign prostatic hyperplasia)   . History of nuclear stress test 03/2004    inf wall scar from base to apex & anterolateral wall ischemia  . Hyperlipidemia   . Vitamin D deficiency     Past Surgical History  Procedure Laterality Date  . Cardiac catheterization  04/2004    normal r-sided pressure, mild pulm HTN  . Prostate surgery  10/2010    transurethral resection   . Transthoracic echocardiogram  05/2006    EF 60-70%; mild calcif of MV; mild MV regurg; LA mildly dilated  . Carpal tunnel release Right      (Not in a hospital admission) Allergies  Allergen Reactions  . Ace Inhibitors Other (See Comments)    Reaction unknown  . Demerol Other (See Comments)    Reaction unknown  . Flomax [Tamsulosin Hcl] Other (See Comments)    Reaction unknown  . Morphine And Related Other (See Comments)    Reaction unknown  . Oxytrol [Oxybutynin] Rash    History  Substance Use Topics  . Smoking status: Former Smoker    Quit date: 06/21/1983  . Smokeless tobacco: Never Used  . Alcohol Use: Yes     Comment: wine every now and then    Family History  Problem Relation Age of Onset  . Diabetes Mother   . Hypertension Mother   . Prostate cancer Father   . Heart disease Father   . Diabetes Brother   . Cancer Brother   . Heart disease Brother   . Diabetes Sister   . Heart disease Sister   .  Hyperlipidemia Child   . Hypertension Child   . Diabetes Child   . Heart attack Child   . Cancer Child      Review of Systems  Constitutional: Negative.   HENT: Positive for hearing loss and tinnitus. Negative for congestion, ear discharge, ear pain, nosebleeds and sore throat.   Eyes: Positive for blurred vision and double vision. Negative for photophobia, pain, discharge and redness.  Respiratory: Negative.  Negative for stridor.   Cardiovascular: Negative.   Gastrointestinal: Positive for constipation. Negative for heartburn, nausea, vomiting, abdominal pain, diarrhea,  blood in stool and melena.  Genitourinary: Positive for frequency. Negative for dysuria, urgency, hematuria and flank pain.  Musculoskeletal: Positive for joint pain. Negative for falls.  Skin: Negative.   Neurological: Positive for dizziness. Negative for tingling, tremors, sensory change, speech change, focal weakness, seizures, loss of consciousness and headaches.  Endo/Heme/Allergies: Negative.   Psychiatric/Behavioral: Negative.     Objective:  Physical Exam  Constitutional: He is oriented to person, place, and time. He appears well-developed and well-nourished. No distress.  HENT:  Head: Normocephalic and atraumatic.  Nose: Nose normal.  Eyes: Conjunctivae and EOM are normal.  Neck: Normal range of motion. Neck supple.  Cardiovascular: Normal rate, regular rhythm, normal heart sounds and intact distal pulses.   Respiratory: Effort normal and breath sounds normal. No respiratory distress. He has no wheezes. He has no rales. He exhibits no tenderness.  GI: Soft. Bowel sounds are normal. He exhibits no distension. There is no tenderness.  Musculoskeletal:       Left hip: He exhibits decreased range of motion, decreased strength, tenderness and bony tenderness.  Lymphadenopathy:    He has no cervical adenopathy.  Neurological: He is alert and oriented to person, place, and time. No cranial nerve deficit.  Skin: Skin is warm and dry. No rash noted. No erythema. No pallor.  Psychiatric: He has a normal mood and affect. His behavior is normal.    Vital signs in last 24 hours: @VSRANGES @  Labs:   Estimated body mass index is 25.57 kg/(m^2) as calculated from the following:   Height as of 01/09/14: 6' 1.5" (1.867 m).   Weight as of 01/09/14: 89.132 kg (196 lb 8 oz).   Imaging Review Plain radiographs demonstrate severe degenerative joint disease of the left hip(s). The bone quality appears to be good for age and reported activity level.  Assessment/Plan:  End stage arthritis,  left hip(s)  The patient history, physical examination, clinical judgement of the provider and imaging studies are consistent with end stage degenerative joint disease of the left hip(s) and total hip arthroplasty is deemed medically necessary. The treatment options including medical management, injection therapy, arthroscopy and arthroplasty were discussed at length. The risks and benefits of total hip arthroplasty were presented and reviewed. The risks due to aseptic loosening, infection, stiffness, dislocation/subluxation,  thromboembolic complications and other imponderables were discussed.  The patient acknowledged the explanation, agreed to proceed with the plan and consent was signed. Patient is being admitted for inpatient treatment for surgery, pain control, PT, OT, prophylactic antibiotics, VTE prophylaxis, progressive ambulation and ADL's and discharge planning.The patient is planning to be discharged to skilled nursing facility

## 2014-01-17 ENCOUNTER — Encounter (HOSPITAL_COMMUNITY): Payer: Self-pay | Admitting: *Deleted

## 2014-01-17 ENCOUNTER — Inpatient Hospital Stay (HOSPITAL_COMMUNITY): Payer: Medicare PPO

## 2014-01-17 ENCOUNTER — Encounter (HOSPITAL_COMMUNITY)
Admission: RE | Disposition: A | Discharge status: Skilled Nursing Facility | Payer: Self-pay | Source: Ambulatory Visit | Attending: Orthopedic Surgery

## 2014-01-17 ENCOUNTER — Inpatient Hospital Stay (HOSPITAL_COMMUNITY)
Admission: RE | Admit: 2014-01-17 | Discharge: 2014-01-21 | DRG: 470 | Disposition: A | Discharge status: Skilled Nursing Facility | Payer: Medicare PPO | Source: Ambulatory Visit | Attending: Orthopedic Surgery | Admitting: Orthopedic Surgery

## 2014-01-17 ENCOUNTER — Encounter (HOSPITAL_COMMUNITY): Payer: Medicare PPO | Admitting: Vascular Surgery

## 2014-01-17 ENCOUNTER — Inpatient Hospital Stay (HOSPITAL_COMMUNITY): Payer: Medicare PPO | Admitting: Anesthesiology

## 2014-01-17 DIAGNOSIS — D62 Acute posthemorrhagic anemia: Secondary | ICD-10-CM | POA: Diagnosis not present

## 2014-01-17 DIAGNOSIS — I5042 Chronic combined systolic (congestive) and diastolic (congestive) heart failure: Secondary | ICD-10-CM | POA: Diagnosis present

## 2014-01-17 DIAGNOSIS — H409 Unspecified glaucoma: Secondary | ICD-10-CM | POA: Diagnosis present

## 2014-01-17 DIAGNOSIS — Z7982 Long term (current) use of aspirin: Secondary | ICD-10-CM

## 2014-01-17 DIAGNOSIS — E11319 Type 2 diabetes mellitus with unspecified diabetic retinopathy without macular edema: Secondary | ICD-10-CM | POA: Diagnosis present

## 2014-01-17 DIAGNOSIS — Z01818 Encounter for other preprocedural examination: Secondary | ICD-10-CM

## 2014-01-17 DIAGNOSIS — I509 Heart failure, unspecified: Secondary | ICD-10-CM | POA: Diagnosis present

## 2014-01-17 DIAGNOSIS — F0280 Dementia in other diseases classified elsewhere without behavioral disturbance: Secondary | ICD-10-CM | POA: Diagnosis present

## 2014-01-17 DIAGNOSIS — Z79899 Other long term (current) drug therapy: Secondary | ICD-10-CM | POA: Diagnosis not present

## 2014-01-17 DIAGNOSIS — Z8673 Personal history of transient ischemic attack (TIA), and cerebral infarction without residual deficits: Secondary | ICD-10-CM | POA: Diagnosis not present

## 2014-01-17 DIAGNOSIS — Z885 Allergy status to narcotic agent status: Secondary | ICD-10-CM

## 2014-01-17 DIAGNOSIS — I498 Other specified cardiac arrhythmias: Secondary | ICD-10-CM | POA: Diagnosis present

## 2014-01-17 DIAGNOSIS — G309 Alzheimer's disease, unspecified: Secondary | ICD-10-CM | POA: Diagnosis present

## 2014-01-17 DIAGNOSIS — I129 Hypertensive chronic kidney disease with stage 1 through stage 4 chronic kidney disease, or unspecified chronic kidney disease: Secondary | ICD-10-CM | POA: Diagnosis present

## 2014-01-17 DIAGNOSIS — N318 Other neuromuscular dysfunction of bladder: Secondary | ICD-10-CM | POA: Diagnosis present

## 2014-01-17 DIAGNOSIS — IMO0002 Reserved for concepts with insufficient information to code with codable children: Secondary | ICD-10-CM

## 2014-01-17 DIAGNOSIS — Z96642 Presence of left artificial hip joint: Secondary | ICD-10-CM

## 2014-01-17 DIAGNOSIS — E1049 Type 1 diabetes mellitus with other diabetic neurological complication: Secondary | ICD-10-CM | POA: Diagnosis present

## 2014-01-17 DIAGNOSIS — E1142 Type 2 diabetes mellitus with diabetic polyneuropathy: Secondary | ICD-10-CM | POA: Diagnosis present

## 2014-01-17 DIAGNOSIS — N400 Enlarged prostate without lower urinary tract symptoms: Secondary | ICD-10-CM | POA: Diagnosis present

## 2014-01-17 DIAGNOSIS — N049 Nephrotic syndrome with unspecified morphologic changes: Secondary | ICD-10-CM | POA: Diagnosis present

## 2014-01-17 DIAGNOSIS — M1610 Unilateral primary osteoarthritis, unspecified hip: Secondary | ICD-10-CM | POA: Diagnosis present

## 2014-01-17 DIAGNOSIS — Z87891 Personal history of nicotine dependence: Secondary | ICD-10-CM | POA: Diagnosis not present

## 2014-01-17 DIAGNOSIS — Z01812 Encounter for preprocedural laboratory examination: Secondary | ICD-10-CM

## 2014-01-17 DIAGNOSIS — E559 Vitamin D deficiency, unspecified: Secondary | ICD-10-CM | POA: Diagnosis present

## 2014-01-17 DIAGNOSIS — K219 Gastro-esophageal reflux disease without esophagitis: Secondary | ICD-10-CM | POA: Diagnosis present

## 2014-01-17 DIAGNOSIS — N184 Chronic kidney disease, stage 4 (severe): Secondary | ICD-10-CM | POA: Diagnosis present

## 2014-01-17 DIAGNOSIS — K5900 Constipation, unspecified: Secondary | ICD-10-CM | POA: Diagnosis present

## 2014-01-17 DIAGNOSIS — N058 Unspecified nephritic syndrome with other morphologic changes: Secondary | ICD-10-CM | POA: Diagnosis present

## 2014-01-17 DIAGNOSIS — M169 Osteoarthritis of hip, unspecified: Secondary | ICD-10-CM | POA: Diagnosis present

## 2014-01-17 DIAGNOSIS — E1039 Type 1 diabetes mellitus with other diabetic ophthalmic complication: Secondary | ICD-10-CM | POA: Diagnosis present

## 2014-01-17 DIAGNOSIS — N183 Chronic kidney disease, stage 3 unspecified: Secondary | ICD-10-CM

## 2014-01-17 DIAGNOSIS — E782 Mixed hyperlipidemia: Secondary | ICD-10-CM

## 2014-01-17 DIAGNOSIS — H4010X0 Unspecified open-angle glaucoma, stage unspecified: Secondary | ICD-10-CM | POA: Diagnosis present

## 2014-01-17 DIAGNOSIS — G301 Alzheimer's disease with late onset: Secondary | ICD-10-CM

## 2014-01-17 DIAGNOSIS — Z794 Long term (current) use of insulin: Secondary | ICD-10-CM | POA: Diagnosis not present

## 2014-01-17 DIAGNOSIS — E785 Hyperlipidemia, unspecified: Secondary | ICD-10-CM | POA: Diagnosis present

## 2014-01-17 DIAGNOSIS — E1029 Type 1 diabetes mellitus with other diabetic kidney complication: Secondary | ICD-10-CM | POA: Diagnosis present

## 2014-01-17 DIAGNOSIS — I10 Essential (primary) hypertension: Secondary | ICD-10-CM

## 2014-01-17 DIAGNOSIS — Z888 Allergy status to other drugs, medicaments and biological substances status: Secondary | ICD-10-CM

## 2014-01-17 DIAGNOSIS — Z96649 Presence of unspecified artificial hip joint: Secondary | ICD-10-CM

## 2014-01-17 DIAGNOSIS — E1165 Type 2 diabetes mellitus with hyperglycemia: Secondary | ICD-10-CM

## 2014-01-17 DIAGNOSIS — E1129 Type 2 diabetes mellitus with other diabetic kidney complication: Secondary | ICD-10-CM

## 2014-01-17 HISTORY — PX: TOTAL HIP ARTHROPLASTY: SHX124

## 2014-01-17 LAB — GLUCOSE, CAPILLARY
GLUCOSE-CAPILLARY: 145 mg/dL — AB (ref 70–99)
GLUCOSE-CAPILLARY: 191 mg/dL — AB (ref 70–99)
Glucose-Capillary: 174 mg/dL — ABNORMAL HIGH (ref 70–99)
Glucose-Capillary: 186 mg/dL — ABNORMAL HIGH (ref 70–99)

## 2014-01-17 SURGERY — ARTHROPLASTY, HIP, TOTAL,POSTERIOR APPROACH
Anesthesia: Spinal | Site: Hip | Laterality: Left

## 2014-01-17 MED ORDER — SODIUM CHLORIDE 0.9 % IR SOLN
Status: DC | PRN
  Administered 2014-01-17: 1000 mL

## 2014-01-17 MED ORDER — VITAMIN D3 25 MCG (1000 UNIT) PO TABS
5000.0000 [IU] | ORAL_TABLET | Freq: Every day | ORAL | Status: DC
Start: 2014-01-18 — End: 2014-01-21
  Administered 2014-01-18 – 2014-01-21 (×4): 5000 [IU] via ORAL
  Filled 2014-01-17 (×4): qty 5

## 2014-01-17 MED ORDER — FUROSEMIDE 40 MG PO TABS
40.0000 mg | ORAL_TABLET | Freq: Every day | ORAL | Status: DC
  Administered 2014-01-18 – 2014-01-21 (×4): 40 mg via ORAL
  Filled 2014-01-17 (×4): qty 1

## 2014-01-17 MED ORDER — BRIMONIDINE TARTRATE 0.2 % OP SOLN
1.0000 [drp] | Freq: Three times a day (TID) | OPHTHALMIC | Status: DC
  Administered 2014-01-17 – 2014-01-21 (×11): 1 [drp] via OPHTHALMIC
  Filled 2014-01-17: qty 5

## 2014-01-17 MED ORDER — ROCURONIUM BROMIDE 50 MG/5ML IV SOLN
INTRAVENOUS | Status: AC
  Filled 2014-01-17: qty 1

## 2014-01-17 MED ORDER — BRINZOLAMIDE 1 % OP SUSP
1.0000 [drp] | Freq: Three times a day (TID) | OPHTHALMIC | Status: DC
  Administered 2014-01-17 – 2014-01-21 (×11): 1 [drp] via OPHTHALMIC
  Filled 2014-01-17: qty 10

## 2014-01-17 MED ORDER — GLYCOPYRROLATE 0.2 MG/ML IJ SOLN
INTRAMUSCULAR | Status: AC
  Filled 2014-01-17: qty 3

## 2014-01-17 MED ORDER — FENTANYL CITRATE 0.05 MG/ML IJ SOLN
INTRAMUSCULAR | Status: AC
  Filled 2014-01-17: qty 5

## 2014-01-17 MED ORDER — VITAMIN D3 125 MCG (5000 UT) PO CAPS: 1.0000 | ORAL_CAPSULE | Freq: Every day | ORAL | Status: DC

## 2014-01-17 MED ORDER — HYDROCODONE-ACETAMINOPHEN 5-325 MG PO TABS: 1.0000 | ORAL_TABLET | ORAL | Status: DC | PRN

## 2014-01-17 MED ORDER — POLYETHYLENE GLYCOL 3350 17 G PO PACK: 17.0000 g | PACK | Freq: Every day | ORAL | Status: DC | PRN

## 2014-01-17 MED ORDER — INSULIN DETEMIR 100 UNIT/ML ~~LOC~~ SOLN
45.0000 [IU] | Freq: Every day | SUBCUTANEOUS | Status: DC
  Administered 2014-01-17 – 2014-01-18 (×2): 45 [IU] via SUBCUTANEOUS
  Filled 2014-01-17 (×3): qty 0.45

## 2014-01-17 MED ORDER — ONDANSETRON HCL 4 MG/2ML IJ SOLN
INTRAMUSCULAR | Status: DC | PRN
  Administered 2014-01-17: 4 mg via INTRAVENOUS

## 2014-01-17 MED ORDER — MENTHOL 3 MG MT LOZG
1.0000 | LOZENGE | OROMUCOSAL | Status: DC | PRN
  Filled 2014-01-17: qty 9

## 2014-01-17 MED ORDER — LATANOPROST 0.005 % OP SOLN
1.0000 [drp] | Freq: Every day | OPHTHALMIC | Status: DC
  Administered 2014-01-17 – 2014-01-18 (×2): 1 [drp] via OPHTHALMIC
  Filled 2014-01-17: qty 2.5

## 2014-01-17 MED ORDER — MIRABEGRON ER 25 MG PO TB24
25.0000 mg | ORAL_TABLET | Freq: Every day | ORAL | Status: DC
  Administered 2014-01-17 – 2014-01-20 (×4): 25 mg via ORAL
  Filled 2014-01-17 (×5): qty 1

## 2014-01-17 MED ORDER — NEOSTIGMINE METHYLSULFATE 10 MG/10ML IV SOLN
INTRAVENOUS | Status: DC | PRN
  Administered 2014-01-17: 4 mg via INTRAVENOUS

## 2014-01-17 MED ORDER — CARVEDILOL 6.25 MG PO TABS
6.2500 mg | ORAL_TABLET | Freq: Two times a day (BID) | ORAL | Status: DC
  Filled 2014-01-17: qty 1

## 2014-01-17 MED ORDER — EPHEDRINE SULFATE 50 MG/ML IJ SOLN
INTRAMUSCULAR | Status: AC
  Filled 2014-01-17: qty 1

## 2014-01-17 MED ORDER — ONDANSETRON HCL 4 MG PO TABS: 4.0000 mg | ORAL_TABLET | Freq: Four times a day (QID) | ORAL | Status: DC | PRN

## 2014-01-17 MED ORDER — SODIUM CHLORIDE 0.9 % IV SOLN
INTRAVENOUS | Status: AC
  Administered 2014-01-17: 19:00:00 via INTRAVENOUS

## 2014-01-17 MED ORDER — INSULIN ASPART 100 UNIT/ML ~~LOC~~ SOLN
0.0000 [IU] | Freq: Every day | SUBCUTANEOUS | Status: DC
  Administered 2014-01-18: 2 [IU] via SUBCUTANEOUS
  Administered 2014-01-19: 3 [IU] via SUBCUTANEOUS
  Administered 2014-01-20: 2 [IU] via SUBCUTANEOUS

## 2014-01-17 MED ORDER — CARVEDILOL 6.25 MG PO TABS
6.2500 mg | ORAL_TABLET | Freq: Two times a day (BID) | ORAL | Status: DC
  Administered 2014-01-18 – 2014-01-21 (×3): 6.25 mg via ORAL
  Filled 2014-01-17 (×10): qty 1

## 2014-01-17 MED ORDER — VECURONIUM BROMIDE 10 MG IV SOLR
INTRAVENOUS | Status: AC
  Filled 2014-01-17: qty 10

## 2014-01-17 MED ORDER — ONE-DAILY MULTI VITAMINS PO TABS: 1.0000 | ORAL_TABLET | Freq: Every day | ORAL | Status: DC

## 2014-01-17 MED ORDER — EPHEDRINE SULFATE 50 MG/ML IJ SOLN
INTRAMUSCULAR | Status: DC | PRN
  Administered 2014-01-17 (×2): 10 mg via INTRAVENOUS
  Administered 2014-01-17 (×6): 5 mg via INTRAVENOUS

## 2014-01-17 MED ORDER — HYDROMORPHONE HCL PF 1 MG/ML IJ SOLN: 0.5000 mg | INTRAMUSCULAR | Status: DC | PRN

## 2014-01-17 MED ORDER — GLYCOPYRROLATE 0.2 MG/ML IJ SOLN
INTRAMUSCULAR | Status: DC | PRN
  Administered 2014-01-17: 0.2 mg via INTRAVENOUS
  Administered 2014-01-17: 0.6 mg via INTRAVENOUS

## 2014-01-17 MED ORDER — FENTANYL CITRATE 0.05 MG/ML IJ SOLN
25.0000 ug | INTRAMUSCULAR | Status: DC | PRN
  Administered 2014-01-17: 25 ug via INTRAVENOUS

## 2014-01-17 MED ORDER — CEFAZOLIN SODIUM-DEXTROSE 2-3 GM-% IV SOLR
2.0000 g | Freq: Four times a day (QID) | INTRAVENOUS | Status: AC
  Administered 2014-01-17 (×2): 2 g via INTRAVENOUS
  Filled 2014-01-17 (×2): qty 50

## 2014-01-17 MED ORDER — GLYCOPYRROLATE 0.2 MG/ML IJ SOLN
INTRAMUSCULAR | Status: AC
  Filled 2014-01-17: qty 1

## 2014-01-17 MED ORDER — RIVAROXABAN 10 MG PO TABS
10.0000 mg | ORAL_TABLET | Freq: Every day | ORAL | Status: DC
  Administered 2014-01-18 – 2014-01-21 (×4): 10 mg via ORAL
  Filled 2014-01-17 (×4): qty 1

## 2014-01-17 MED ORDER — LISINOPRIL 10 MG PO TABS
10.0000 mg | ORAL_TABLET | Freq: Every day | ORAL | Status: DC
  Administered 2014-01-18 – 2014-01-21 (×4): 10 mg via ORAL
  Filled 2014-01-17 (×4): qty 1

## 2014-01-17 MED ORDER — LIDOCAINE HCL (CARDIAC) 20 MG/ML IV SOLN
INTRAVENOUS | Status: DC | PRN
  Administered 2014-01-17: 30 mg via INTRAVENOUS

## 2014-01-17 MED ORDER — HYDRALAZINE HCL 25 MG PO TABS
25.0000 mg | ORAL_TABLET | Freq: Two times a day (BID) | ORAL | Status: DC
Start: 2014-01-17 — End: 2014-01-21
  Administered 2014-01-17 – 2014-01-21 (×8): 25 mg via ORAL
  Filled 2014-01-17 (×9): qty 1

## 2014-01-17 MED ORDER — POLYETHYLENE GLYCOL 3350 17 G PO PACK
17.0000 g | PACK | Freq: Every day | ORAL | Status: DC | PRN
  Administered 2014-01-18 – 2014-01-19 (×2): 17 g via ORAL
  Filled 2014-01-17 (×3): qty 1

## 2014-01-17 MED ORDER — ADULT MULTIVITAMIN W/MINERALS CH
1.0000 | ORAL_TABLET | Freq: Every day | ORAL | Status: DC
  Administered 2014-01-18 – 2014-01-21 (×4): 1 via ORAL
  Filled 2014-01-17 (×4): qty 1

## 2014-01-17 MED ORDER — FENTANYL CITRATE 0.05 MG/ML IJ SOLN
INTRAMUSCULAR | Status: AC
  Filled 2014-01-17: qty 2

## 2014-01-17 MED ORDER — INSULIN ASPART 100 UNIT/ML ~~LOC~~ SOLN
0.0000 [IU] | Freq: Three times a day (TID) | SUBCUTANEOUS | Status: DC
  Administered 2014-01-17: 2 [IU] via SUBCUTANEOUS
  Administered 2014-01-18 (×3): 3 [IU] via SUBCUTANEOUS
  Administered 2014-01-19 (×2): 5 [IU] via SUBCUTANEOUS
  Administered 2014-01-19: 3 [IU] via SUBCUTANEOUS
  Administered 2014-01-20: 2 [IU] via SUBCUTANEOUS
  Administered 2014-01-20: 1 [IU] via SUBCUTANEOUS
  Administered 2014-01-20 – 2014-01-21 (×2): 3 [IU] via SUBCUTANEOUS
  Administered 2014-01-21: 1 [IU] via SUBCUTANEOUS

## 2014-01-17 MED ORDER — STERILE WATER FOR INJECTION IJ SOLN
INTRAMUSCULAR | Status: AC
  Filled 2014-01-17: qty 10

## 2014-01-17 MED ORDER — SODIUM CHLORIDE 0.9 % IJ SOLN
INTRAMUSCULAR | Status: AC
  Filled 2014-01-17: qty 10

## 2014-01-17 MED ORDER — LACTATED RINGERS IV SOLN
INTRAVENOUS | Status: DC | PRN
  Administered 2014-01-17 (×3): via INTRAVENOUS

## 2014-01-17 MED ORDER — DOCUSATE SODIUM 100 MG PO CAPS
100.0000 mg | ORAL_CAPSULE | Freq: Two times a day (BID) | ORAL | Status: DC
  Administered 2014-01-17 – 2014-01-21 (×8): 100 mg via ORAL
  Filled 2014-01-17 (×8): qty 1

## 2014-01-17 MED ORDER — ATORVASTATIN CALCIUM 10 MG PO TABS
10.0000 mg | ORAL_TABLET | Freq: Every day | ORAL | Status: DC
  Administered 2014-01-17 – 2014-01-20 (×4): 10 mg via ORAL
  Filled 2014-01-17 (×5): qty 1

## 2014-01-17 MED ORDER — MAGNESIUM GLUCONATE 500 MG PO TABS
500.0000 mg | ORAL_TABLET | Freq: Every day | ORAL | Status: DC
  Administered 2014-01-18 – 2014-01-21 (×4): 500 mg via ORAL
  Filled 2014-01-17 (×5): qty 1

## 2014-01-17 MED ORDER — ONDANSETRON HCL 4 MG/2ML IJ SOLN
INTRAMUSCULAR | Status: AC
  Filled 2014-01-17: qty 2

## 2014-01-17 MED ORDER — VECURONIUM BROMIDE 10 MG IV SOLR
INTRAVENOUS | Status: DC | PRN
  Administered 2014-01-17: 2 mg via INTRAVENOUS

## 2014-01-17 MED ORDER — ROCURONIUM BROMIDE 100 MG/10ML IV SOLN
INTRAVENOUS | Status: DC | PRN
  Administered 2014-01-17: 40 mg via INTRAVENOUS

## 2014-01-17 MED ORDER — LATANOPROST 0.005 % OP SOLN
1.0000 [drp] | Freq: Every day | OPHTHALMIC | Status: DC
Start: 2014-01-17 — End: 2014-01-21
  Administered 2014-01-19 – 2014-01-20 (×2): 1 [drp] via OPHTHALMIC
  Filled 2014-01-17: qty 2.5

## 2014-01-17 MED ORDER — FENTANYL CITRATE 0.05 MG/ML IJ SOLN
INTRAMUSCULAR | Status: DC | PRN
  Administered 2014-01-17: 100 ug via INTRAVENOUS
  Administered 2014-01-17: 50 ug via INTRAVENOUS

## 2014-01-17 MED ORDER — PHENYLEPHRINE 40 MCG/ML (10ML) SYRINGE FOR IV PUSH (FOR BLOOD PRESSURE SUPPORT)
PREFILLED_SYRINGE | INTRAVENOUS | Status: AC
  Filled 2014-01-17: qty 10

## 2014-01-17 MED ORDER — ONDANSETRON HCL 4 MG/2ML IJ SOLN: 4.0000 mg | Freq: Four times a day (QID) | INTRAMUSCULAR | Status: DC | PRN

## 2014-01-17 MED ORDER — RIVAROXABAN 10 MG PO TABS: 10.0000 mg | ORAL_TABLET | Freq: Every day | ORAL | Status: DC

## 2014-01-17 MED ORDER — MUPIROCIN 2 % EX OINT
1.0000 "application " | TOPICAL_OINTMENT | Freq: Two times a day (BID) | CUTANEOUS | Status: DC
  Administered 2014-01-17 – 2014-01-21 (×7): 1 via NASAL
  Filled 2014-01-17: qty 22

## 2014-01-17 MED ORDER — LIDOCAINE HCL (CARDIAC) 20 MG/ML IV SOLN
INTRAVENOUS | Status: AC
  Filled 2014-01-17: qty 5

## 2014-01-17 MED ORDER — CHLORHEXIDINE GLUCONATE CLOTH 2 % EX PADS
6.0000 | MEDICATED_PAD | Freq: Every day | CUTANEOUS | Status: DC
  Administered 2014-01-18 – 2014-01-21 (×3): 6 via TOPICAL

## 2014-01-17 MED ORDER — SIMVASTATIN 20 MG PO TABS
20.0000 mg | ORAL_TABLET | Freq: Every day | ORAL | Status: DC
  Filled 2014-01-17: qty 1

## 2014-01-17 MED ORDER — ONDANSETRON HCL 4 MG/2ML IJ SOLN: 4.0000 mg | Freq: Once | INTRAMUSCULAR | Status: DC | PRN

## 2014-01-17 MED ORDER — PHENYLEPHRINE HCL 10 MG/ML IJ SOLN
INTRAMUSCULAR | Status: DC | PRN
  Administered 2014-01-17: 40 ug via INTRAVENOUS
  Administered 2014-01-17: 80 ug via INTRAVENOUS

## 2014-01-17 MED ORDER — ALBUMIN HUMAN 5 % IV SOLN
INTRAVENOUS | Status: DC | PRN
  Administered 2014-01-17 (×2): via INTRAVENOUS

## 2014-01-17 MED ORDER — PROPOFOL 10 MG/ML IV BOLUS
INTRAVENOUS | Status: AC
  Filled 2014-01-17: qty 20

## 2014-01-17 MED ORDER — PROPOFOL 10 MG/ML IV BOLUS
INTRAVENOUS | Status: DC | PRN
  Administered 2014-01-17: 30 mg via INTRAVENOUS
  Administered 2014-01-17: 190 mg via INTRAVENOUS

## 2014-01-17 MED ORDER — NEOSTIGMINE METHYLSULFATE 10 MG/10ML IV SOLN
INTRAVENOUS | Status: AC
  Filled 2014-01-17: qty 1

## 2014-01-17 MED ORDER — BISACODYL 5 MG PO TBEC
5.0000 mg | DELAYED_RELEASE_TABLET | Freq: Every day | ORAL | Status: DC | PRN
  Administered 2014-01-19 – 2014-01-20 (×2): 5 mg via ORAL
  Filled 2014-01-17 (×2): qty 1

## 2014-01-17 MED ORDER — DILTIAZEM HCL ER 240 MG PO CP24
240.0000 mg | ORAL_CAPSULE | Freq: Every day | ORAL | Status: DC
  Administered 2014-01-17 – 2014-01-20 (×4): 240 mg via ORAL
  Filled 2014-01-17 (×5): qty 1

## 2014-01-17 MED ORDER — FLEET ENEMA 7-19 GM/118ML RE ENEM: 1.0000 | ENEMA | Freq: Once | RECTAL | Status: AC | PRN

## 2014-01-17 MED ORDER — HYDROCODONE-ACETAMINOPHEN 5-325 MG PO TABS
1.0000 | ORAL_TABLET | ORAL | Status: DC | PRN
  Administered 2014-01-18: 1 via ORAL
  Administered 2014-01-19: 2 via ORAL
  Filled 2014-01-17: qty 1
  Filled 2014-01-17: qty 2

## 2014-01-17 SURGICAL SUPPLY — 69 items
BLADE SAW SAG 73X25 THK (BLADE) ×2
BLADE SAW SGTL 73X25 THK (BLADE) ×1 IMPLANT
BRUSH FEMORAL CANAL (MISCELLANEOUS) IMPLANT
CAPT HIP PF MOP ×2 IMPLANT
COVER BACK TABLE 24X17X13 BIG (DRAPES) IMPLANT
COVER SURGICAL LIGHT HANDLE (MISCELLANEOUS) ×3 IMPLANT
DRAPE INCISE IOBAN 66X45 STRL (DRAPES) ×2 IMPLANT
DRAPE ORTHO SPLIT 77X108 STRL (DRAPES) ×6
DRAPE SURG ORHT 6 SPLT 77X108 (DRAPES) ×2 IMPLANT
DRAPE U-SHAPE 47X51 STRL (DRAPES) ×3 IMPLANT
DRSG ADAPTIC 3X8 NADH LF (GAUZE/BANDAGES/DRESSINGS) ×3 IMPLANT
DRSG PAD ABDOMINAL 8X10 ST (GAUZE/BANDAGES/DRESSINGS) ×4 IMPLANT
DURAPREP 26ML APPLICATOR (WOUND CARE) ×3 IMPLANT
ELECT CAUTERY BLADE 6.4 (BLADE) ×3 IMPLANT
ELECT REM PT RETURN 9FT ADLT (ELECTROSURGICAL) ×3
ELECTRODE REM PT RTRN 9FT ADLT (ELECTROSURGICAL) ×1 IMPLANT
EVACUATOR 1/8 PVC DRAIN (DRAIN) IMPLANT
FACESHIELD WRAPAROUND (MASK) ×6 IMPLANT
FACESHIELD WRAPAROUND OR TEAM (MASK) ×2 IMPLANT
GLOVE BIO SURGEON STRL SZ 6.5 (GLOVE) ×2 IMPLANT
GLOVE BIO SURGEON STRL SZ7.5 (GLOVE) ×4 IMPLANT
GLOVE BIO SURGEONS STRL SZ 6.5 (GLOVE) ×2
GLOVE BIOGEL PI IND STRL 6.5 (GLOVE) IMPLANT
GLOVE BIOGEL PI IND STRL 8 (GLOVE) ×2 IMPLANT
GLOVE BIOGEL PI INDICATOR 6.5 (GLOVE) ×2
GLOVE BIOGEL PI INDICATOR 8 (GLOVE) ×4
GLOVE ORTHO TXT STRL SZ7.5 (GLOVE) ×6 IMPLANT
GLOVE SURG ORTHO 8.0 STRL STRW (GLOVE) ×12 IMPLANT
GOWN STRL REUS W/ TWL LRG LVL3 (GOWN DISPOSABLE) ×1 IMPLANT
GOWN STRL REUS W/ TWL XL LVL3 (GOWN DISPOSABLE) ×2 IMPLANT
GOWN STRL REUS W/TWL 2XL LVL3 (GOWN DISPOSABLE) ×3 IMPLANT
GOWN STRL REUS W/TWL LRG LVL3 (GOWN DISPOSABLE) ×9
GOWN STRL REUS W/TWL XL LVL3 (GOWN DISPOSABLE) ×6
HANDPIECE INTERPULSE COAX TIP (DISPOSABLE)
HOOD PEEL AWAY FACE SHEILD DIS (HOOD) ×5 IMPLANT
IMMOBILIZER KNEE 20 (SOFTGOODS) IMPLANT
IMMOBILIZER KNEE 22 (SOFTGOODS) ×2 IMPLANT
IMMOBILIZER KNEE 22 UNIV (SOFTGOODS) ×2 IMPLANT
IMMOBILIZER KNEE 24 THIGH 36 (MISCELLANEOUS) IMPLANT
IMMOBILIZER KNEE 24 UNIV (MISCELLANEOUS)
KIT BASIN OR (CUSTOM PROCEDURE TRAY) ×3 IMPLANT
KIT ROOM TURNOVER OR (KITS) ×3 IMPLANT
MANIFOLD NEPTUNE II (INSTRUMENTS) ×3 IMPLANT
NDL MAYO TROCAR (NEEDLE) ×1 IMPLANT
NEEDLE 22X1 1/2 (OR ONLY) (NEEDLE) ×3 IMPLANT
NEEDLE MAYO TROCAR (NEEDLE) ×3 IMPLANT
NS IRRIG 1000ML POUR BTL (IV SOLUTION) ×3 IMPLANT
PACK TOTAL JOINT (CUSTOM PROCEDURE TRAY) ×3 IMPLANT
PAD ABD 8X10 STRL (GAUZE/BANDAGES/DRESSINGS) ×2 IMPLANT
PAD ARMBOARD 7.5X6 YLW CONV (MISCELLANEOUS) ×6 IMPLANT
PRESSURIZER FEMORAL UNIV (MISCELLANEOUS) IMPLANT
SET HNDPC FAN SPRY TIP SCT (DISPOSABLE) IMPLANT
SLING ARM IMMOBILIZER LRG (SOFTGOODS) ×2 IMPLANT
SPONGE GAUZE 4X4 12PLY (GAUZE/BANDAGES/DRESSINGS) ×3 IMPLANT
SPONGE GAUZE 4X4 12PLY STER LF (GAUZE/BANDAGES/DRESSINGS) ×2 IMPLANT
STAPLER VISISTAT 35W (STAPLE) ×3 IMPLANT
SUCTION FRAZIER TIP 10 FR DISP (SUCTIONS) ×3 IMPLANT
SUT ETHIBOND NAB CT1 #1 30IN (SUTURE) ×12 IMPLANT
SUT VIC AB 0 CT1 27 (SUTURE) ×6
SUT VIC AB 0 CT1 27XBRD ANBCTR (SUTURE) IMPLANT
SUT VIC AB 1 CTB1 27 (SUTURE) ×6 IMPLANT
SUT VIC AB 2-0 CT1 27 (SUTURE) ×6
SUT VIC AB 2-0 CT1 TAPERPNT 27 (SUTURE) ×2 IMPLANT
SYR CONTROL 10ML LL (SYRINGE) ×1 IMPLANT
TOWEL OR 17X24 6PK STRL BLUE (TOWEL DISPOSABLE) ×3 IMPLANT
TOWEL OR 17X26 10 PK STRL BLUE (TOWEL DISPOSABLE) ×3 IMPLANT
TOWER CARTRIDGE SMART MIX (DISPOSABLE) IMPLANT
TRAY FOLEY CATH 16FRSI W/METER (SET/KITS/TRAYS/PACK) ×3 IMPLANT
WATER STERILE IRR 1000ML POUR (IV SOLUTION) ×8 IMPLANT

## 2014-01-17 NOTE — Anesthesia Preprocedure Evaluation (Addendum)
Anesthesia Evaluation  Patient identified by MRN, date of birth, ID band Patient awake    Reviewed: Allergy & Precautions, H&P , NPO status , Patient's Chart, lab work & pertinent test results, reviewed documented beta blocker date and time   Airway Mallampati: II TM Distance: >3 FB     Dental  (+) Loose,    Pulmonary former smoker,  breath sounds clear to auscultation        Cardiovascular hypertension, Pt. on medications and Pt. on home beta blockers Rhythm:Regular     Neuro/Psych  Neuromuscular disease    GI/Hepatic   Endo/Other  diabetes, Type 2  Renal/GU      Musculoskeletal   Abdominal   Peds  Hematology   Anesthesia Other Findings   Reproductive/Obstetrics                         Anesthesia Physical Anesthesia Plan  ASA: III  Anesthesia Plan: Spinal   Post-op Pain Management:    Induction:   Airway Management Planned: Natural Airway and Simple Face Mask  Additional Equipment:   Intra-op Plan:   Post-operative Plan:   Informed Consent: I have reviewed the patients History and Physical, chart, labs and discussed the procedure including the risks, benefits and alternatives for the proposed anesthesia with the patient or authorized representative who has indicated his/her understanding and acceptance.     Plan Discussed with: CRNA and Anesthesiologist  Anesthesia Plan Comments: (DJD L. Hip Type 2 DM Renal insufficiency Cr 1.59 Hypertension BPH  Plan SAB)        Anesthesia Quick Evaluation

## 2014-01-17 NOTE — H&P (View-Only) (Signed)
TOTAL HIP ADMISSION H&P  Patient is admitted for left total hip arthroplasty.  Subjective:  Chief Complaint: left hip pain  HPI: Gregory Hatfield, 80 y.o. male, has a history of pain and functional disability in the left hip(s) due to arthritis and patient has failed non-surgical conservative treatments for greater than 12 weeks to include NSAID's and/or analgesics, corticosteriod injections, use of assistive devices and activity modification.  Onset of symptoms was gradual starting 10 years ago with gradually worsening course since that time.The patient noted no past surgery on the left hip(s).  Patient currently rates pain in the left hip at 8 out of 10 with activity. Patient has night pain, worsening of pain with activity and weight bearing, trendelenberg gait, pain that interfers with activities of daily living and pain with passive range of motion. Patient has evidence of periarticular osteophytes and joint space narrowing by imaging studies. This condition presents safety issues increasing the risk of falls. There is no current active infection.  Patient Active Problem List   Diagnosis Date Noted  . T1 IDDM w/ Neuropathy 10/23/2013  . Encounter for long-term (current) use of other medications 10/23/2013  . Chronic renal insufficiency, stage III (moderate) 06/18/2013  . Vitamin D Deficiency 06/16/2013  . Alzheimer's disease 06/16/2013  . T1 IDDM w/Stage 3 CKD (GFR 44 ml/min) 06/16/2013  . BPH (benign prostatic hyperplasia) 07/01/2011  . Hyperlipidemia 12/08/2009  . Open-angle glaucoma (365.10) 12/08/2009  . OVERACTIVE BLADDER 12/08/2009  . Hypertension 01/10/2007  . GERD 01/10/2007   Past Medical History  Diagnosis Date  . Diabetes mellitus     insulin-dependent  . CHF (congestive heart failure)   . GERD (gastroesophageal reflux disease)   . Hypertension   . Dyslipidemia   . Glaucoma   . CVA (cerebral infarction)     Left Eye  . Diabetic retinopathy   . CKD (chronic kidney  disease)   . BPH (benign prostatic hyperplasia)   . History of nuclear stress test 03/2004    inf wall scar from base to apex & anterolateral wall ischemia  . Hyperlipidemia   . Vitamin D deficiency     Past Surgical History  Procedure Laterality Date  . Cardiac catheterization  04/2004    normal r-sided pressure, mild pulm HTN  . Prostate surgery  10/2010    transurethral resection   . Transthoracic echocardiogram  05/2006    EF 60-70%; mild calcif of MV; mild MV regurg; LA mildly dilated  . Carpal tunnel release Right      (Not in a hospital admission) Allergies  Allergen Reactions  . Ace Inhibitors Other (See Comments)    Reaction unknown  . Demerol Other (See Comments)    Reaction unknown  . Flomax [Tamsulosin Hcl] Other (See Comments)    Reaction unknown  . Morphine And Related Other (See Comments)    Reaction unknown  . Oxytrol [Oxybutynin] Rash    History  Substance Use Topics  . Smoking status: Former Smoker    Quit date: 06/21/1983  . Smokeless tobacco: Never Used  . Alcohol Use: Yes     Comment: wine every now and then    Family History  Problem Relation Age of Onset  . Diabetes Mother   . Hypertension Mother   . Prostate cancer Father   . Heart disease Father   . Diabetes Brother   . Cancer Brother   . Heart disease Brother   . Diabetes Sister   . Heart disease Sister   .  Hyperlipidemia Child   . Hypertension Child   . Diabetes Child   . Heart attack Child   . Cancer Child      Review of Systems  Constitutional: Negative.   HENT: Positive for hearing loss and tinnitus. Negative for congestion, ear discharge, ear pain, nosebleeds and sore throat.   Eyes: Positive for blurred vision and double vision. Negative for photophobia, pain, discharge and redness.  Respiratory: Negative.  Negative for stridor.   Cardiovascular: Negative.   Gastrointestinal: Positive for constipation. Negative for heartburn, nausea, vomiting, abdominal pain, diarrhea,  blood in stool and melena.  Genitourinary: Positive for frequency. Negative for dysuria, urgency, hematuria and flank pain.  Musculoskeletal: Positive for joint pain. Negative for falls.  Skin: Negative.   Neurological: Positive for dizziness. Negative for tingling, tremors, sensory change, speech change, focal weakness, seizures, loss of consciousness and headaches.  Endo/Heme/Allergies: Negative.   Psychiatric/Behavioral: Negative.     Objective:  Physical Exam  Constitutional: He is oriented to person, place, and time. He appears well-developed and well-nourished. No distress.  HENT:  Head: Normocephalic and atraumatic.  Nose: Nose normal.  Eyes: Conjunctivae and EOM are normal.  Neck: Normal range of motion. Neck supple.  Cardiovascular: Normal rate, regular rhythm, normal heart sounds and intact distal pulses.   Respiratory: Effort normal and breath sounds normal. No respiratory distress. He has no wheezes. He has no rales. He exhibits no tenderness.  GI: Soft. Bowel sounds are normal. He exhibits no distension. There is no tenderness.  Musculoskeletal:       Left hip: He exhibits decreased range of motion, decreased strength, tenderness and bony tenderness.  Lymphadenopathy:    He has no cervical adenopathy.  Neurological: He is alert and oriented to person, place, and time. No cranial nerve deficit.  Skin: Skin is warm and dry. No rash noted. No erythema. No pallor.  Psychiatric: He has a normal mood and affect. His behavior is normal.    Vital signs in last 24 hours: @VSRANGES @  Labs:   Estimated body mass index is 25.57 kg/(m^2) as calculated from the following:   Height as of 01/09/14: 6' 1.5" (1.867 m).   Weight as of 01/09/14: 89.132 kg (196 lb 8 oz).   Imaging Review Plain radiographs demonstrate severe degenerative joint disease of the left hip(s). The bone quality appears to be good for age and reported activity level.  Assessment/Plan:  End stage arthritis,  left hip(s)  The patient history, physical examination, clinical judgement of the provider and imaging studies are consistent with end stage degenerative joint disease of the left hip(s) and total hip arthroplasty is deemed medically necessary. The treatment options including medical management, injection therapy, arthroscopy and arthroplasty were discussed at length. The risks and benefits of total hip arthroplasty were presented and reviewed. The risks due to aseptic loosening, infection, stiffness, dislocation/subluxation,  thromboembolic complications and other imponderables were discussed.  The patient acknowledged the explanation, agreed to proceed with the plan and consent was signed. Patient is being admitted for inpatient treatment for surgery, pain control, PT, OT, prophylactic antibiotics, VTE prophylaxis, progressive ambulation and ADL's and discharge planning.The patient is planning to be discharged to skilled nursing facility

## 2014-01-17 NOTE — Brief Op Note (Signed)
01/17/2014  10:45 AM  PATIENT:  Gregory Hatfield  80 y.o. male  PRE-OPERATIVE DIAGNOSIS:  OA LEFT HIP  POST-OPERATIVE DIAGNOSIS:  OA LEFT HIP  PROCEDURE:  Procedure(s): LEFT TOTAL HIP ARTHROPLASTY (Left)  SURGEON:  Surgeon(s) and Role:    * W D Valeta Harms., MD - Primary  PHYSICIAN ASSISTANT: Chriss Czar, PA-C  ASSISTANTS:   ANESTHESIA:   spinal and general  EBL:  Total I/O In: 2500 [I.V.:2000; IV Piggyback:500] Out: 900 [Urine:400; Blood:500]  BLOOD ADMINISTERED:none  DRAINS: none   LOCAL MEDICATIONS USED:  NONE  SPECIMEN:  No Specimen  DISPOSITION OF SPECIMEN:  N/A  COUNTS:  YES  TOURNIQUET:  * No tourniquets in log *  DICTATION: .Other Dictation: Dictation Number unknown  PLAN OF CARE: Admit to inpatient   PATIENT DISPOSITION:  PACU - hemodynamically stable.   Delay start of Pharmacological VTE agent (>24hrs) due to surgical blood loss or risk of bleeding: yes

## 2014-01-17 NOTE — Anesthesia Procedure Notes (Addendum)
Procedure Name: Intubation Date/Time: 01/17/2014 8:03 AM Performed by: Luciana Axe K Pre-anesthesia Checklist: Patient identified, Emergency Drugs available, Suction available, Patient being monitored and Timeout performed Patient Re-evaluated:Patient Re-evaluated prior to inductionOxygen Delivery Method: Circle system utilized Preoxygenation: Pre-oxygenation with 100% oxygen Intubation Type: IV induction Ventilation: Mask ventilation without difficulty Laryngoscope Size: Miller and 3 Grade View: Grade I Tube type: Oral Tube size: 8.0 mm Number of attempts: 1 Airway Equipment and Method: Stylet Placement Confirmation: ETT inserted through vocal cords under direct vision,  positive ETCO2,  CO2 detector and breath sounds checked- equal and bilateral Secured at: 22 cm Tube secured with: Tape Dental Injury: Teeth and Oropharynx as per pre-operative assessment     Anesthesia Regional Block:   Narrative:    Spinal  Patient location during procedure: OR Start time: 01/17/2014 7:35 AM End time: 01/17/2014 7:45 AM Staffing Performed by: anesthesiologist  Preanesthetic Checklist Completed: patient identified, site marked, surgical consent, pre-op evaluation, timeout performed, IV checked, risks and benefits discussed and monitors and equipment checked Spinal Block Patient position: left lateral decubitus Prep: ChloraPrep Patient monitoring: heart rate, cardiac monitor, continuous pulse ox and blood pressure Location: L5-S1 Injection technique: single-shot Needle Needle type: Tuohy  Needle gauge: 22 G Needle length: 9 cm Needle insertion depth: 6 cm Assessment Events: failed spinal Additional Notes Clear CSF return, easy draw back. 10 mg 0.75% marcaine injected easily. After 5 min anesthesia inadequate, decision made to proceed to [facility]

## 2014-01-17 NOTE — Progress Notes (Signed)
Entering pts home meds postoperatively Simbrinza (Brinzolamide-Brimonidine) eye drops which he takes for elevated IOP 1 drop each eyeTID nonformulary.  Spoke with pharmacy on the phone if pt able to obtain drops can use his home drops.  Not sure if pt will be able to get these, pharmacy will try to obtain to use while inpatient.  Planning for d/c to SNF Monday.

## 2014-01-17 NOTE — Progress Notes (Signed)
Care of pt assumed by MA Dearies Meikle RN 

## 2014-01-17 NOTE — Progress Notes (Signed)
Utilization review completed. Stashia Sia, RN, BSN. 

## 2014-01-17 NOTE — Progress Notes (Signed)
Dr Linna Caprice here and spoke with pt re LUE. No new orders

## 2014-01-17 NOTE — Anesthesia Postprocedure Evaluation (Signed)
  Anesthesia Post-op Note  Patient: Gregory Hatfield  Procedure(s) Performed: Procedure(s): LEFT TOTAL HIP ARTHROPLASTY (Left)  Patient Location: PACU  Anesthesia Type:General  Level of Consciousness: awake, alert  and oriented  Airway and Oxygen Therapy: Patient Spontanous Breathing and Patient connected to nasal cannula oxygen  Post-op Pain: mild  Post-op Assessment: Post-op Vital signs reviewed, Patient's Cardiovascular Status Stable, Respiratory Function Stable, Patent Airway, No signs of Nausea or vomiting and Pain level controlled  Post-op Vital Signs: stable  Last Vitals:  Filed Vitals:   01/17/14 1415  BP: 103/37  Pulse: 54  Temp: 36.3 C  Resp: 54    Complications: No apparent anesthesia complications

## 2014-01-17 NOTE — Progress Notes (Signed)
Held ordered preop tylenol d/t patient having taking PO tylenol.

## 2014-01-17 NOTE — Transfer of Care (Signed)
Immediate Anesthesia Transfer of Care Note  Patient: Gregory Hatfield  Procedure(s) Performed: Procedure(s): LEFT TOTAL HIP ARTHROPLASTY (Left)  Patient Location: PACU  Anesthesia Type:General  Level of Consciousness: awake, oriented and patient cooperative  Airway & Oxygen Therapy: Patient Spontanous Breathing and Patient connected to nasal cannula oxygen  Post-op Assessment: Report given to PACU RN and Post -op Vital signs reviewed and stable  Post vital signs: Reviewed  Complications: No apparent anesthesia complications

## 2014-01-17 NOTE — Evaluation (Signed)
Physical Therapy Evaluation Patient Details Name: Gregory Hatfield MRN: [id number] DOB: Jan 09, 1926 Today's Date: 01/17/2014   History of Present Illness  80 y.o. male admitted to [facility] on 01/17/14 for elective L THA.  Pt with significant PMHx of DM, CHF, HTN, glaucoma with diabetic retinopathy, CAD, CVA, and lumbar/cervical spine surgeries.   Clinical Impression  Pt is POD #0 and is limited by lightheadedness EOB.  BP was taken after positioning him back in supine (as this was safer) and was 144/67.  HR and O2 sats were stable as well.  Pt would benefit from SNF placement after d/c for rehabilitation.   PT to follow acutely for deficits listed below.       Follow Up Recommendations SNF    Equipment Recommendations  None recommended by PT    Recommendations for Other Services       Precautions / Restrictions Precautions Precautions: Posterior Hip;Fall Precaution Comments: reviewed motion restrictions Required Braces or Orthoses: Knee Immobilizer - Left Knee Immobilizer - Left:  (for positioning in bed) Restrictions Weight Bearing Restrictions: Yes LLE Weight Bearing: Weight bearing as tolerated      Mobility  Bed Mobility Overal bed mobility: Needs Assistance Bed Mobility: Supine to Sit;Sit to Supine     Supine to sit: Min assist;HOB elevated Sit to supine: Mod assist   General bed mobility comments: Min assist to support his left leg when going to sitting.  Pt able to used bed rail for leverage when moving to EOB.  Mod assist to support both of his legs to get to supine.    Transfers Overall transfer level:  (NT as pt was very lightheaded EOB)                                              Pertinent Vitals/Pain See vitals flow sheet.     Home Living Family/patient expects to be discharged to:: Skilled nursing facility                      Prior Function Level of Independence: Independent with assistive device(s)         Comments:  used RW PTA        Extremity/Trunk Assessment   Upper Extremity Assessment: Defer to OT evaluation           Lower Extremity Assessment: LLE deficits/detail   LLE Deficits / Details: left leg with normal post op weakness.  Ankle WNL, knee 3/5, hip 2+/5.   Cervical / Trunk Assessment: Other exceptions  Communication   Communication: No difficulties  Cognition Arousal/Alertness: Awake/alert Behavior During Therapy: WFL for tasks assessed/performed Overall Cognitive Status: No family/caregiver present to determine baseline cognitive functioning                               Assessment/Plan    PT Assessment Patient needs continued PT services  PT Diagnosis Difficulty walking;Abnormality of gait;Generalized weakness;Acute pain   PT Problem List Decreased range of motion;Decreased strength;Decreased activity tolerance;Decreased balance;Decreased mobility;Decreased knowledge of use of DME;Decreased knowledge of precautions;Pain  PT Treatment Interventions DME instruction;Gait training;Functional mobility training;Therapeutic activities;Therapeutic exercise;Balance training;Neuromuscular re-education;Patient/family education;Modalities   PT Goals (Current goals can be found in the Care Plan section) Acute Rehab PT Goals Patient Stated Goal: to walk again PT Goal Formulation: With patient Time For  Goal Achievement: 01/24/14 Potential to Achieve Goals: Good    Frequency 7X/week    End of Session   Activity Tolerance: Patient limited by fatigue Patient left: in bed;with call bell/phone within reach;with bed alarm set           Time: [id number] PT Time Calculation (min): 28 min   Charges:   PT Evaluation $Initial PT Evaluation Tier I: 1 Procedure PT Treatments $Therapeutic Activity: 8-22 mins        Eilah Common B. [place], [place], DPT [phone]   01/17/2014, 11:03 PM

## 2014-01-17 NOTE — Progress Notes (Signed)
Pt c/o "left hand & fingers feeling numb". Fingers warm, dry, cap refill <3sec, 3+ l radial pulse and he can feel me pinching fingers. Dr Linna Caprice notified and he will be by to check pt. Will cont to monitor

## 2014-01-17 NOTE — Consult Note (Signed)
Patient Demographics  Gregory Hatfield, is a 80 y.o. male   MRN: [id number]   DOB - 1925-10-02  Admit Date - 01/17/2014    Outpatient Primary MD for the patient is Alesia Richards, MD  Consult requested in the Hospital by Yvette Rack., MD, On 01/17/2014    Reason for consult diabetes hypertension management   With History of -  Past Medical History  Diagnosis Date  . Diabetes mellitus     insulin-dependent  . CHF (congestive heart failure)   . GERD (gastroesophageal reflux disease)   . Hypertension   . Dyslipidemia   . Glaucoma   . CVA (cerebral infarction)     Left Eye  . Diabetic retinopathy   . CKD (chronic kidney disease)   . BPH (benign prostatic hyperplasia)   . History of nuclear stress test 03/2004    inf wall scar from base to apex & anterolateral wall ischemia  . Hyperlipidemia   . Vitamin D deficiency       Past Surgical History  Procedure Laterality Date  . Cardiac catheterization  04/2004    normal r-sided pressure, mild pulm HTN  . Prostate surgery  10/2010    transurethral resection   . Transthoracic echocardiogram  05/2006    EF 60-70%; mild calcif of MV; mild MV regurg; LA mildly dilated  . Carpal tunnel release Right   . Lumbar spine surgery  2003  . Cervical spine surgery  1990's    in for   No chief complaint on file.    HPI  Gregory Hatfield  is a 80 y.o. male, with history of insulin-dependent type 1 diabetes mellitus, dyslipidemia, hypertension, BPH, CVA in the past, diabetic retinopathy, last echo in 2007 present in records was essentially unremarkable, was admitted by orthopedics for elective total left hip replacement surgery for left hip osteoarthritis. Hospitalist consult was called to manage hypertension and diabetes, patient currently in PACU awake alert and completely  symptom-free.  Preop he is been cleared by his cardiologist Dr. Debara Pickett for the surgery, he currently has no headache, no fever chills, no chest pain palpitations cough and shortness of breath, no abdominal pain. No focal weakness.    Review of Systems    In addition to the HPI above,   No Fever-chills, No Headache, No changes with Vision or hearing, No problems swallowing food or Liquids, No Chest pain, Cough or Shortness of Breath, No Abdominal pain, No Nausea or Vommitting, Bowel movements are regular, No Blood in stool or Urine, No dysuria, No new skin rashes or bruises, No new joints pains-aches,  No new weakness, tingling, numbness in any extremity, No recent weight gain or loss, No polyuria, polydypsia or polyphagia, No significant Mental Stressors.  A full 10 point Review of Systems was done, except as stated above, all other Review of Systems were negative.   Social History History  Substance Use Topics  . Smoking status: Former Smoker    Quit date: 06/21/1983  . Smokeless tobacco: Never  Used  . Alcohol Use: Yes     Comment: wine every now and then      Family History Family History  Problem Relation Age of Onset  . Diabetes Mother   . Hypertension Mother   . Prostate cancer Father   . Heart disease Father   . Diabetes Brother   . Cancer Brother   . Heart disease Brother   . Diabetes Sister   . Heart disease Sister   . Hyperlipidemia Child   . Hypertension Child   . Diabetes Child   . Heart attack Child   . Cancer Child       Prior to Admission medications   Medication Sig Start Date End Date Taking? Authorizing Provider  acetaminophen (TYLENOL ARTHRITIS PAIN) 650 MG CR tablet Take 650-1,300 mg by mouth 2 (two) times daily. 2 tablets every morning and 1 tablet at bedtime   Yes Historical Provider, MD  aspirin 81 MG EC tablet Take 81 mg by mouth daily.    Yes Historical Provider, MD  carvedilol (COREG) 12.5 MG tablet TAKE 1 TABLET TWICE DAILY.  01/13/14  Yes [name], MD  Cholecalciferol (VITAMIN D3) 5000 UNITS CAPS Take 1 capsule by mouth daily.    Yes Historical Provider, MD  diltiazem (DILACOR XR) 240 MG 24 hr capsule Take 240 mg by mouth at bedtime. For blood pressure   Yes Historical Provider, MD  furosemide (LASIX) 40 MG tablet Take 40 mg by mouth daily. For fluid retention   Yes Historical Provider, MD  glucose blood (ONE TOUCH ULTRA TEST) test strip Use as directed to check blood sugar 6 times a day dx 250.43 07/01/13  Yes Renato Shin, MD  hydrALAZINE (APRESOLINE) 25 MG tablet Take 25 mg by mouth 2 (two) times daily. For blood pressure   Yes Historical Provider, MD  insulin detemir (LEVEMIR) 100 UNIT/ML injection Inject 55 Units into the skin every morning.   Yes Historical Provider, MD  lisinopril (PRINIVIL,ZESTRIL) 20 MG tablet Take 10 mg by mouth daily.  11/08/13  Yes Historical Provider, MD  LUMIGAN 0.01 % SOLN Place 1 drop into the right eye at bedtime.  03/29/13  Yes Historical Provider, MD  magnesium gluconate (MAGONATE) 500 MG tablet Take 500 mg by mouth daily.   Yes Historical Provider, MD  Mirabegron ER (MYRBETRIQ) 25 MG TB24 Take 25 mg by mouth daily. For bladder   Yes Historical Provider, MD  Multiple Vitamin (MULTIVITAMIN) tablet Take 1 tablet by mouth daily.     Yes Historical Provider, MD  Polyethylene Glycol 3350 (MIRALAX PO) Take by mouth.   Yes Historical Provider, MD  pravastatin (PRAVACHOL) 40 MG tablet Take 40 mg by mouth daily. For cholesterol   Yes Historical Provider, MD  SIMBRINZA 1-0.2 % SUSP Place 1 drop into both eyes 3 (three) times daily. 02/28/13  Yes Historical Provider, MD  Insulin Pen Needle (B-D UF III MINI PEN NEEDLES) 31G X 5 MM MISC CHECK BLOOD SUGAR 4 TIMES DAILY 02/14/13   Renato Shin, MD  LEVEMIR FLEXTOUCH 100 UNIT/ML Pen INJECT 65 UNITS INTO THE SKIN DAILY AS DIRECTED. 01/13/14   Renato Shin, MD    Anti-infectives   Start     Dose/Rate Route Frequency Ordered Stop   01/17/14 0600   ceFAZolin (ANCEF) IVPB 2 g/50 mL premix     2 g 100 mL/hr over 30 Minutes Intravenous On call to O.R. 01/16/14 1258 01/17/14 0755      Scheduled Meds: . chlorhexidine  60 mL Topical  Once  . chlorhexidine  60 mL Topical Once  . fentaNYL      . [START ON 01/19/2014] rivaroxaban  10 mg Oral Q breakfast   Continuous Infusions: . sodium chloride     PRN Meds:.fentaNYL, ondansetron (ZOFRAN) IV  Allergies  Allergen Reactions  . Ace Inhibitors Other (See Comments)    Reaction unknown  . Demerol Other (See Comments)    Reaction unknown  . Flomax [Tamsulosin Hcl] Other (See Comments)    Reaction unknown  . Morphine And Related Other (See Comments)    Reaction unknown  . Oxytrol [Oxybutynin] Rash    Physical Exam  Vitals  Blood pressure 118/52, pulse 45, temperature 97 F (36.1 C), temperature source Oral, resp. rate 17, weight 88.905 kg (196 lb), SpO2 100.00%.   1. General elderly African American male lying in bed in NAD,     2. Normal affect and insight, Not Suicidal or Homicidal, Awake Alert, Oriented X 3.  3. No F.N deficits, ALL C.Nerves Intact, Strength 5/5 all 4 extremities, Sensation intact all 4 extremities, Plantars down going.  4. Ears and Eyes appear Normal, Conjunctivae clear, PERRLA. Moist Oral Mucosa.  5. Supple Neck, No JVD, No cervical lymphadenopathy appriciated, No Carotid Bruits.  6. Symmetrical Chest wall movement, Good air movement bilaterally, CTAB.  7. RRR, No Gallops, Rubs or Murmurs, No Parasternal Heave.  8. Positive Bowel Sounds, Abdomen Soft, No tenderness, No organomegaly appriciated,No rebound -guarding or rigidity.  9.  No Cyanosis, Normal Skin Turgor, No Skin Rash or Bruise.  10. Good muscle tone,  joints appear normal , no effusions, Normal ROM.  11. No Palpable Lymph Nodes in Neck or Axillae     Data Review  CBC No results found for this basename: WBC, HGB, HCT, PLT, MCV, MCH, MCHC, RDW, NEUTRABS, LYMPHSABS, MONOABS, EOSABS,  BASOSABS, BANDABS, BANDSABD,  in the last 168 hours ------------------------------------------------------------------------------------------------------------------  Chemistries  No results found for this basename: NA, K, CL, CO2, GLUCOSE, BUN, CREATININE, GFRCGP, CALCIUM, MG, AST, ALT, ALKPHOS, BILITOT,  in the last 168 hours ------------------------------------------------------------------------------------------------------------------ CrCl is unknown because both a height and weight (above a minimum accepted value) are required for this calculation. ------------------------------------------------------------------------------------------------------------------ No results found for this basename: TSH, T4TOTAL, FREET3, T3FREE, THYROIDAB,  in the last 72 hours   Coagulation profile No results found for this basename: INR, PROTIME,  in the last 168 hours ------------------------------------------------------------------------------------------------------------------- No results found for this basename: DDIMER,  in the last 72 hours -------------------------------------------------------------------------------------------------------------------  Cardiac Enzymes No results found for this basename: CK, CKMB, TROPONINI, MYOGLOBIN,  in the last 168 hours ------------------------------------------------------------------------------------------------------------------ No components found with this basename: POCBNP,    ---------------------------------------------------------------------------------------------------------------  Urinalysis    Component Value Date/Time   COLORURINE YELLOW 01/09/2014 [place] 01/09/2014 1347   LABSPEC 1.024 01/09/2014 1347   PHURINE 6.0 01/09/2014 1347   GLUCOSEU 100* 01/09/2014 [place] 01/09/2014 1347   BILIRUBINUR NEGATIVE 01/09/2014 1347   KETONESUR NEGATIVE 01/09/2014 1347   PROTEINUR NEGATIVE 01/09/2014 1347    UROBILINOGEN 1.0 01/09/2014 1347   NITRITE NEGATIVE 01/09/2014 [place] 01/09/2014 1347     Imaging results:   No results found.       Assessment & Plan   1. DM type I with diabetic neuropathy and retinopathy. He is stable, will start him on 45 units of Levimir each bedtime along with q. a.c. at bedtime low-dose sliding scale. Monitor sugars and adjust insulin as needed.   2. Hypertension.  Perioperative mild bradycardia noted, reduce Coreg dose, continue diltiazem both with holding parameters written. Monitor blood pressure. We'll monitor on telemetry for 24 hours for bradycardia. Likely this is due to sedation from anesthesia along with hypothermia. Should wear off. Continued ACE inhibitor and hydralazine.   3. Mild perioperative hypothermia. Is getting rewarming per protocol via blanket, monitor core temperature in PACU, transport out once temperature is stable for over one hour. Check TSH.   4. Dyslipidemia on Pravachol continued.   5. Overactive bladder we'll continue home medication Myrbetriq.    6. CK D. stage IV baseline creatinine anywhere between 1.4-1.6, repeat BMP in the morning. Note he is on ACE inhibitor.    7. Elective left hip total replacement. We'll defer management to primary team, monitor H&H, anticoagulation per primary team. Currently noted to be xaralto, hold home dose aspirin while xaralto is continued.      DVT Prophylaxis per primary team  AM Labs Ordered, also please review Full Orders  Family Communication: Plan discussed with patient     Thank you for the consult, we will follow the patient with you in the Hospital.   Lala Lund K M.D on 01/17/2014 at 12:00 PM  Between 7am to 7pm - Pager - [phone]  After 7pm go to [email or web address] - password [email or web address]  And look for the night coverage person covering me after hours   Thank you for the consult, we will follow the patient with you in the [facility] Office  [phone]   **Disclaimer: This note may have been dictated with voice recognition software. Similar sounding words can inadvertently be transcribed and this note may contain transcription errors which may not have been corrected upon publication of note.**

## 2014-01-17 NOTE — Interval H&P Note (Signed)
History and Physical Interval Note:  01/17/2014 7:25 AM  Gregory Hatfield  has presented today for surgery, with the diagnosis of OA LEFT HIP  The various methods of treatment have been discussed with the patient and family. After consideration of risks, benefits and other options for treatment, the patient has consented to  Procedure(s): LEFT TOTAL HIP ARTHROPLASTY (Left) as a surgical intervention .  The patient's history has been reviewed, patient examined, no change in status, stable for surgery.  I have reviewed the patient's chart and labs.  Questions were answered to the patient's satisfaction.     Sharief Wainwright JR,W D

## 2014-01-17 NOTE — Plan of Care (Signed)
Problem: Consults Goal: Diagnosis- Total Joint Replacement Primary Total Hip     

## 2014-01-18 DIAGNOSIS — N183 Chronic kidney disease, stage 3 unspecified: Secondary | ICD-10-CM

## 2014-01-18 DIAGNOSIS — E1165 Type 2 diabetes mellitus with hyperglycemia: Secondary | ICD-10-CM

## 2014-01-18 DIAGNOSIS — E1129 Type 2 diabetes mellitus with other diabetic kidney complication: Secondary | ICD-10-CM

## 2014-01-18 LAB — GLUCOSE, CAPILLARY
GLUCOSE-CAPILLARY: 191 mg/dL — AB (ref 70–99)
GLUCOSE-CAPILLARY: 207 mg/dL — AB (ref 70–99)
Glucose-Capillary: 237 mg/dL — ABNORMAL HIGH (ref 70–99)
Glucose-Capillary: 241 mg/dL — ABNORMAL HIGH (ref 70–99)
Glucose-Capillary: 215 mg/dL — ABNORMAL HIGH (ref 70–99)

## 2014-01-18 LAB — BASIC METABOLIC PANEL
Anion gap: 11 (ref 5–15)
BUN: 25 mg/dL — ABNORMAL HIGH (ref 6–23)
CO2: 24 mEq/L (ref 19–32)
Calcium: 8.4 mg/dL (ref 8.4–10.5)
Chloride: 104 mEq/L (ref 96–112)
Creatinine, Ser: 1.35 mg/dL (ref 0.50–1.35)
GFR calc non Af Amer: 46 mL/min — ABNORMAL LOW (ref >90)
GFR, EST AFRICAN AMERICAN: 53 mL/min — AB (ref >90)
Glucose, Bld: 199 mg/dL — ABNORMAL HIGH (ref 70–99)
POTASSIUM: 4.2 meq/L (ref 3.7–5.3)
Sodium: 139 mEq/L (ref 137–147)

## 2014-01-18 LAB — CBC
HCT: 27.6 % — ABNORMAL LOW (ref 39.0–52.0)
Hemoglobin: 9 g/dL — ABNORMAL LOW (ref 13.0–17.0)
MCH: 28.8 pg (ref 26.0–34.0)
MCHC: 32.6 g/dL (ref 30.0–36.0)
MCV: 88.2 fL (ref 78.0–100.0)
PLATELETS: 145 10*3/uL — AB (ref 150–400)
RBC: 3.13 MIL/uL — ABNORMAL LOW (ref 4.22–5.81)
RDW: 13.6 % (ref 11.5–15.5)
WBC: 6.3 10*3/uL (ref 4.0–10.5)

## 2014-01-18 NOTE — Op Note (Signed)
NAME:  Gregory Hatfield, Gregory Hatfield NO.:  [id number]  MEDICAL RECORD NO.:  [id number]  LOCATION:  [facility]                         FACILITY:  [facility]  PHYSICIAN:  Lockie Pares, M.D.    DATE OF BIRTH:  [birth year]  DATE OF PROCEDURE:  01/17/2014 DATE OF DISCHARGE:                              OPERATIVE REPORT   PREOPERATIVE DIAGNOSIS:  Severe osteoarthritis, left hip, with mild protrusio acetabulum.  POSTOPERATIVE DIAGNOSIS:  Severe osteoarthritis, left hip, with mild protrusio acetabulum.  OPERATION:  Left total hip replacement (DePuy AML 16.5-mm small stature stem, 36-mm hip ball, +12-mm neck length, 56-mm Marathon poly with 56-mm Pinnacle cup with Gription Sector cup and Marathon poly +10 degrees.  SURGEON:  Lockie Pares, MD.  DESCRIPTION OF PROCEDURE:  The patient was in the lateral position.  He had initially had a spinal anesthetic which was not effective and had a general anesthetic.  Foley catheter placed for the procedure due to medical history.  He had posterior approach to the hip made with splitting of the iliotibial band and the gluteus maximus fascia.  We split the external rotators of the hip and did a T-capsulotomy in the hip.  Severe OA with somewhat of a fused appearance to the hip was present with minimal motion.  We had cut the neck in situ, then delivered the head out of the acetabulum, revealing a moderate protrusio acetabulum with posterior eccentric wear as well.  Progressively, reamed and rasped for 0.5 mm under reaming for the 16.5-mm small stature stem with a good press-fit obtained with the broach.  Attention was next directed to the acetabulum, placed wing retractors superiorly and posteriorly.  Again, the elongated centralized acetabulum had eroded essentially to the inner table.  We did not the deepen the acetabulum appreciably due to this fact.  We then reamed with approximately 15 degrees of anteversion and 50 degrees of  abduction, essentially enlarging the defect to a circular defect without deepening it much.  We then placed a trial cup which did not seat for 56 for 1-mm under ream, then placed the final metal cup with a trial plastic liner with the 10-degree lip liner in approximately the 2 o'clock position for the left hip.  We then trialed the femur with the broach initially, could not seem to get even -2 and therefore, I cut a little more as we had cut very minimal amount of neck.  After cutting the neck in point of fact that, we actually progressively trialed up to the +12 which was reasonably stable with the trialing with the final prosthesis. Specifically, parameters of dislocation were 100 degrees of flexion, full adduction and then once we internally rotated past 30 to 45 degrees, the hip was dislocatable, and all other parameters were stable. Once we trialed off the broach, we placed the final poly with a 10- degree lip liner, then the final prosthesis and then again trialed off that to accept the +12-mm, 36-mm hip ball.  All surfaces of the bone and prosthesis were irrigated prior to and post placing of the prosthesis.  T-capsulotomy was closed with #1 Ethibond. The posterior approach with the fascia and gluteus maximus with a running #1  Ethibond, subcutaneous tissues with 0 and 2-0 Vicryl, skin with skin clips, Marcaine infiltrated the skin.  Lightly compressive sterile dressing, knee immobilizer applied.  Taken to the recovery room in stable condition.     Lockie Pares, M.D.     WDC/MEDQ  D:  01/17/2014  T:  01/17/2014  Job:  [id number]

## 2014-01-18 NOTE — Progress Notes (Signed)
Physical Therapy Treatment Patient Details Name: Gregory Hatfield MRN: [id number] DOB: 1925/10/11 Today's Date: 01/18/2014    History of Present Illness 80 y.o. male admitted to [facility] on 01/17/14 for elective L THA.  Pt with significant PMHx of DM, CHF, HTN, glaucoma with diabetic retinopathy, CAD, CVA, and lumbar/cervical spine surgeries.     PT Comments    Progressing towards goals. Continue to recommend SNF for ongoing Physical Therapy.  Demonstrates orthostatic hypotension.   Follow Up Recommendations  SNF     Equipment Recommendations  None recommended by PT    Recommendations for Other Services       Precautions / Restrictions Precautions Precautions: Posterior Hip;Fall Precaution Booklet Issued: Yes (comment) Precaution Comments: reviewed motion restrictions Required Braces or Orthoses: Knee Immobilizer - Left Restrictions Weight Bearing Restrictions: Yes LLE Weight Bearing: Weight bearing as tolerated    Mobility  Bed Mobility Overal bed mobility: Needs Assistance Bed Mobility: Supine to Sit     Supine to sit: Min assist;HOB elevated     General bed mobility comments: Assist with LE's and cues for step by step sequencing  Transfers Overall transfer level: Needs assistance Equipment used: Rolling walker (2 wheeled) Transfers: Sit to/from Stand Sit to Stand: Mod assist;+2 safety/equipment         General transfer comment: step by step instructions on how to stand up and sit down  Ambulation/Gait Ambulation/Gait assistance: Min assist;+2 safety/equipment Ambulation Distance (Feet): 5 Feet Assistive device: Rolling walker (2 wheeled) Gait Pattern/deviations: Step-to pattern;Decreased stride length     General Gait Details: After sitting down after ambulation pt c/o dizziness.  BP assessed. See orthostatics.   Stairs            Wheelchair Mobility    Modified Rankin (Stroke Patients Only)       Balance                                    Cognition Arousal/Alertness: Awake/alert Behavior During Therapy: WFL for tasks assessed/performed Overall Cognitive Status: No family/caregiver present to determine baseline cognitive functioning       Memory: Decreased recall of precautions              Exercises Total Joint Exercises Ankle Circles/Pumps: AROM;Left;10 reps;Supine Heel Slides: AAROM;Left;10 reps;Supine Hip ABduction/ADduction: AAROM;Left;10 reps;Supine Long Arc Quad: AROM;Left;10 reps;Seated    General Comments General comments (skin integrity, edema, etc.): pt is very tangential with conversation.  Very willing to participate. Did demonstrate orthostatic hypotension.      Pertinent Vitals/Pain C/o mild pain with mobility.  Did not give it a rating. Positioned comfortable at end of session.     Home Living                      Prior Function            PT Goals (current goals can now be found in the care plan section) Progress towards PT goals: Progressing toward goals    Frequency       PT Plan Current plan remains appropriate    Co-evaluation             End of Session Equipment Utilized During Treatment: Gait belt;Left knee immobilizer Activity Tolerance: Treatment limited secondary to medical complications (Comment) (dizziness) Patient left: in chair;with call bell/phone within reach;with chair alarm set     Time: [id number] PT Time Calculation (min): 27 min  Charges:  $Gait Training: 8-22 mins $Therapeutic Exercise: 8-22 mins                    G Codes:      Melvern Banker 2014/02/15, 11:16 AM Lavonia Dana, PT  [phone] 2014-02-15

## 2014-01-18 NOTE — Progress Notes (Signed)
[facility] PROGRESS NOTE   Gregory Hatfield [id number] DOB: 1925-11-12 DOA: 01/17/2014 PCP: Alesia Richards, MD  HPI/Subjective: Feels okay, denies any complaints. Had his insulin last night.  Assessment/Plan: Principal Problem:   Status post left hip replacement Active Problems:   Hyperlipidemia   Hypertension   GERD   BPH (benign prostatic hyperplasia)   Alzheimer's disease   T1 IDDM w/Stage 3 CKD (GFR 44 ml/min)   Chronic renal insufficiency, stage III (moderate)   T1 IDDM w/ Neuropathy   Osteoarthritis of left hip    IDDM -With diabetic neuropathy and retinopathy. He is stable -Started on Levemir 45 units, will put him back on his home dose of 55 units of Levemir insulin.  Hypertension -Perioperative mild bradycardia noted, reduce Coreg dose. -Continue diltiazem and lisinopril.  Mild perioperative hypothermia -Resolved after he warmed per protocol the regular blankets. -Normal TSH.  CKD stage IV  baseline creatinine anywhere between 1.4-1.6, repeat BMP in the morning. Note he is on ACE inhibitor.   Elective left hip total replacement -We'll defer management to primary team. -Patient is on Xarelto   Code Status: Full code Family Communication: Plan discussed with the patient. Disposition Plan: Remains inpatient   Consultants:  [facility] is consulted for diabetes/HTN  Procedures:  Left total hip replacement done by Dr. French Ana on 7/31  Antibiotics:  Ancef (perioperative)   Objective: Filed Vitals:   01/18/14 0956  BP: 129/53  Pulse: 71  Temp:   Resp:     Intake/Output Summary (Last 24 hours) at 01/18/14 1048 Last data filed at 01/18/14 0530  Gross per 24 hour  Intake 1137.5 ml  Output   1550 ml  Net -412.5 ml   Filed Weights   01/17/14 0606  Weight: 88.905 kg (196 lb)    Exam: General: Alert and awake, oriented x3, not in any acute distress. HEENT: anicteric sclera, pupils reactive to light and accommodation,  EOMI CVS: S1-S2 clear, no murmur rubs or gallops Chest: clear to auscultation bilaterally, no wheezing, rales or rhonchi Abdomen: soft nontender, nondistended, normal bowel sounds, no organomegaly Extremities: no cyanosis, clubbing or edema noted bilaterally Neuro: Cranial nerves II-XII intact, no focal neurological deficits  Data Reviewed: Basic Metabolic Panel:  Recent Labs Lab 01/18/14 0422  NA 139  K 4.2  CL 104  CO2 24  GLUCOSE 199*  BUN 25*  CREATININE 1.35  CALCIUM 8.4   Liver Function Tests: No results found for this basename: AST, ALT, ALKPHOS, BILITOT, PROT, ALBUMIN,  in the last 168 hours No results found for this basename: LIPASE, AMYLASE,  in the last 168 hours No results found for this basename: AMMONIA,  in the last 168 hours CBC:  Recent Labs Lab 01/18/14 0422  WBC 6.3  HGB 9.0*  HCT 27.6*  MCV 88.2  PLT 145*   Cardiac Enzymes: No results found for this basename: CKTOTAL, CKMB, CKMBINDEX, TROPONINI,  in the last 168 hours BNP (last 3 results) No results found for this basename: PROBNP,  in the last 8760 hours CBG:  Recent Labs Lab 01/17/14 1048 01/17/14 1654 01/17/14 2124 01/18/14 0519 01/18/14 0629  GLUCAP 145* 186* 191* 191* 207*    Micro Recent Results (from the past 240 hour(s))  URINE CULTURE     Status: None   Collection Time    01/09/14  1:45 PM      Result Value Ref Range Status   Specimen Description URINE, CLEAN CATCH   Final   Special Requests ADDED AT 1755 ON  [id number]   Final   Culture  Setup Time     Final   Value: 01/09/2014 19:14     Performed at [facility]     Final   Value: 15,000 COLONIES/ML     Performed at [facility]   Culture     Final   Value: Multiple bacterial morphotypes present, none predominant. Suggest appropriate recollection if clinically indicated.     Performed at [facility]   Report Status 01/10/2014 FINAL   Final  SURGICAL PCR SCREEN     Status: Abnormal    Collection Time    01/09/14  1:46 PM      Result Value Ref Range Status   MRSA, PCR POSITIVE (*) NEGATIVE Final   Staphylococcus aureus POSITIVE (*) NEGATIVE Final   Comment:            The Xpert SA Assay (FDA     approved for NASAL specimens     in patients over 22 years of age),     is one component of     a comprehensive surveillance     program.  Test performance has     been validated by [employer] for patients greater     than or equal to 13 year old.     It is not intended     to diagnose infection nor to     guide or monitor treatment.     Studies: Dg Pelvis Portable  01/17/2014   CLINICAL DATA:  Left total hip arthroplasty.  EXAM: PORTABLE PELVIS 1-2 VIEWS; PORTABLE LEFT HIP - 1 VIEW  COMPARISON:  None.  FINDINGS: Well seated components of a total left hip arthroplasty. No complicating features are demonstrated.  IMPRESSION: Well seated components of a total left hip arthroplasty.   Electronically Signed   By: Kalman Jewels M.D.   On: 01/17/2014 12:13   Dg Hip Portable 1 View Left  01/17/2014   CLINICAL DATA:  Left total hip arthroplasty.  EXAM: PORTABLE PELVIS 1-2 VIEWS; PORTABLE LEFT HIP - 1 VIEW  COMPARISON:  None.  FINDINGS: Well seated components of a total left hip arthroplasty. No complicating features are demonstrated.  IMPRESSION: Well seated components of a total left hip arthroplasty.   Electronically Signed   By: Kalman Jewels M.D.   On: 01/17/2014 12:13    Scheduled Meds: . atorvastatin  10 mg Oral q1800  . brimonidine  1 drop Both Eyes TID  . brinzolamide  1 drop Both Eyes TID  . carvedilol  6.25 mg Oral BID WC  . Chlorhexidine Gluconate Cloth  6 each Topical Q0600  . cholecalciferol  5,000 Units Oral Daily  . diltiazem  240 mg Oral QHS  . docusate sodium  100 mg Oral BID  . furosemide  40 mg Oral Daily  . hydrALAZINE  25 mg Oral BID  . insulin aspart  0-5 Units Subcutaneous QHS  . insulin aspart  0-9 Units Subcutaneous TID WC  . insulin  detemir  45 Units Subcutaneous QHS  . latanoprost  1 drop Right Eye QHS  . latanoprost  1 drop Right Eye QHS  . lisinopril  10 mg Oral Daily  . magnesium gluconate  500 mg Oral Daily  . mirabegron ER  25 mg Oral Daily  . multivitamin with minerals  1 tablet Oral Daily  . mupirocin ointment  1 application Nasal BID  . rivaroxaban  10 mg Oral  Daily   Continuous Infusions:      Time spent: 35 minutes    [facility]  [facility] Pager [phone] If 7PM-7AM, please contact night-coverage at [email or web address], password [facility] 01/18/2014, 10:48 AM  LOS: 1 day

## 2014-01-18 NOTE — Evaluation (Signed)
Occupational Therapy Evaluation Patient Details Name: Gregory Hatfield MRN: [id number] DOB: 06/30/1925 Today's Date: 01/18/2014    History of Present Illness 80 y.o. male admitted to [facility] on 01/17/14 for elective L THA.  Pt with significant PMHx of DM, CHF, HTN, glaucoma with diabetic retinopathy, CAD, CVA, and lumbar/cervical spine surgeries.    Clinical Impression   Pt admitted with above. He demonstrates the below listed deficits and will benefit from continued OT at SNF to maximize safety and independence with BADLs.  Pt presents to OT with generalized weakness, decrease recall of precautions.  He requires mod A for LB ADLs and will benefit from SNF level rehab at discharge.  All further OT needs can be met at [facility].  Will sign off.       Follow Up Recommendations  SNF;Supervision/Assistance - 24 hour    Equipment Recommendations  None recommended by OT    Recommendations for Other Services       Precautions / Restrictions Precautions Precautions: Posterior Hip;Fall Precaution Booklet Issued: Yes (comment) Precaution Comments: reviewed motion restrictions Required Braces or Orthoses: Knee Immobilizer - Left Restrictions Weight Bearing Restrictions: Yes LLE Weight Bearing: Weight bearing as tolerated      Mobility Bed Mobility Overal bed mobility: Needs Assistance Bed Mobility: Supine to Sit     Supine to sit: Min assist;HOB elevated     General bed mobility comments: Assist with LE's and cues for step by step sequencing  Transfers Overall transfer level: Needs assistance Equipment used: Rolling walker (2 wheeled) Transfers: Sit to/from [facility] Sit to Stand: Mod assist Stand pivot transfers: Mod assist       General transfer comment: cues for hand placement and assist to move into standing     Balance                                            ADL Overall ADL's : Needs assistance/impaired Eating/Feeding:  Independent;Sitting   Grooming: Wash/dry hands;Wash/dry face;Oral care;Brushing hair;Set up;Sitting   Upper Body Bathing: Supervision/ safety;Sitting   Lower Body Bathing: Moderate assistance;Sit to/from stand;Adhering to hip precautions;With adaptive equipment   Upper Body Dressing : Supervision/safety;Sitting   Lower Body Dressing: Moderate assistance;With adaptive equipment;Adhering to hip precautions;Sit to/from stand   Toilet Transfer: Moderate assistance;BSC;Stand-pivot;Cueing for sequencing;Adhering to hip precautions;Cueing for safety   Toileting- Clothing Manipulation and Hygiene: Total assistance;Sit to/from stand       Functional mobility during ADLs: Minimal assistance;+2 for physical assistance General ADL Comments: Pt and daughter were instructed in posterior THA precautions and use of AE for LB ADLs     Vision                     Perception     Praxis      Pertinent Vitals/Pain Denies pain      Hand Dominance Right   Extremity/Trunk Assessment Upper Extremity Assessment Upper Extremity Assessment: LUE deficits/detail LUE Deficits / Details: Pt reports h/o "muscle tear" Lt shoulder.  He demonstrates full AROM shoulder, but reports he has to move slowly    Lower Extremity Assessment Lower Extremity Assessment: Defer to PT evaluation   Cervical / Trunk Assessment Cervical / Trunk Assessment: Other exceptions Cervical / Trunk Exceptions: h/o low back and cervical spine surgery   Communication Communication Communication: No difficulties   Cognition Arousal/Alertness: Awake/alert Behavior During Therapy: [facility] for  tasks assessed/performed Overall Cognitive Status: History of cognitive impairments - at baseline       Memory: Decreased recall of precautions             General Comments       Exercises Exercises: Total Joint     Shoulder Instructions      Home Living Family/patient expects to be discharged to:: Skilled nursing  facility                                        Prior Functioning/Environment Level of Independence: Independent with assistive device(s)        Comments: used RW PTA;  Pt and daughter report pt was modified independent with all BADLs and IADLs    OT Diagnosis: Generalized weakness;Cognitive deficits   OT Problem List: Decreased strength;Decreased activity tolerance;Impaired balance (sitting and/or standing);Decreased cognition;Decreased safety awareness;Decreased knowledge of precautions;Decreased knowledge of use of DME or AE   OT Treatment/Interventions:      OT Goals(Current goals can be found in the care plan section) Acute Rehab OT Goals Patient Stated Goal: to walk again  OT Frequency:     Barriers to D/C:            Co-evaluation              End of Session Equipment Utilized During Treatment: Rolling walker Nurse Communication: Mobility status  Activity Tolerance: Patient tolerated treatment well Patient left: in chair;with call bell/phone within reach;with chair alarm set   Time: 1231-1310 OT Time Calculation (min): 39 min Charges:  OT General Charges $OT Visit: 1 Procedure OT Evaluation $Initial OT Evaluation Tier I: 1 Procedure OT Treatments $Self Care/Home Management : 23-37 mins G-Codes:    Japneet Staggs M January 27, 2014, 1:45 PM

## 2014-01-18 NOTE — Progress Notes (Signed)
     Subjective:  Patient reports pain as moderate.  No complaints, but had some dizziness when he got up to go the bathroom this am.    Objective:   VITALS:   Filed Vitals:   01/18/14 0531 01/18/14 0700 01/18/14 0742 01/18/14 0956  BP: 148/62  133/45 129/53  Pulse: 73  65 71  Temp: 99.1 F (37.3 C)     TempSrc: Oral     Resp: 18  16   Height:  6\' 1"  (1.854 m)    Weight:      SpO2: 100%  95%     Neurologically intact Dorsiflexion/Plantar flexion intact Incision: dressing C/D/I  CBG (last 3)   Recent Labs  01/17/14 2124 01/18/14 0519 01/18/14 0629  GLUCAP 191* 191* 207*      Lab Results  Component Value Date   WBC 6.3 01/18/2014   HGB 9.0* 01/18/2014   HCT 27.6* 01/18/2014   MCV 88.2 01/18/2014   PLT 145* 01/18/2014   BMET    Component Value Date/Time   NA 139 01/18/2014 0422   K 4.2 01/18/2014 0422   CL 104 01/18/2014 0422   CO2 24 01/18/2014 0422   GLUCOSE 199* 01/18/2014 0422   BUN 25* 01/18/2014 0422   CREATININE 1.35 01/18/2014 0422   CREATININE 1.47* 12/11/2013 1621   CALCIUM 8.4 01/18/2014 0422   GFRNONAA 63* 01/18/2014 0422   GFRNONAA 43* 12/11/2013 1621   GFRAA 53* 01/18/2014 0422   GFRAA 49* 12/11/2013 1621     Assessment/Plan: 1 Day Post-Op   Principal Problem:   Status post left hip replacement Active Problems:   Hyperlipidemia   Hypertension   GERD   BPH (benign prostatic hyperplasia)   Alzheimer's disease   T1 IDDM w/Stage 3 CKD (GFR 44 ml/min)   Chronic renal insufficiency, stage III (moderate)   T1 IDDM w/ Neuropathy   Osteoarthritis of left hip  Overall doing fair.  Will monitor symptoms of light headedness, mild ABLA, will recheck in am.   Renal insufficiency seems adequate, will monitor. PT, WBAT.   Yuta Cipollone P 01/18/2014, 10:02 AM   Marchia Bond, MD Cell [phone]

## 2014-01-19 DIAGNOSIS — G309 Alzheimer's disease, unspecified: Secondary | ICD-10-CM

## 2014-01-19 DIAGNOSIS — F0280 Dementia in other diseases classified elsewhere without behavioral disturbance: Secondary | ICD-10-CM

## 2014-01-19 LAB — BASIC METABOLIC PANEL
Anion gap: 12 (ref 5–15)
BUN: 30 mg/dL — AB (ref 6–23)
CALCIUM: 8.2 mg/dL — AB (ref 8.4–10.5)
CO2: 23 mEq/L (ref 19–32)
Chloride: 103 mEq/L (ref 96–112)
Creatinine, Ser: 1.65 mg/dL — ABNORMAL HIGH (ref 0.50–1.35)
GFR calc Af Amer: 42 mL/min — ABNORMAL LOW (ref >90)
GFR, EST NON AFRICAN AMERICAN: 36 mL/min — AB (ref >90)
Glucose, Bld: 217 mg/dL — ABNORMAL HIGH (ref 70–99)
Potassium: 3.9 mEq/L (ref 3.7–5.3)
Sodium: 138 mEq/L (ref 137–147)

## 2014-01-19 LAB — CBC
HEMATOCRIT: 23.4 % — AB (ref 39.0–52.0)
HEMOGLOBIN: 7.7 g/dL — AB (ref 13.0–17.0)
MCH: 28.9 pg (ref 26.0–34.0)
MCHC: 32.9 g/dL (ref 30.0–36.0)
MCV: 88 fL (ref 78.0–100.0)
Platelets: 129 10*3/uL — ABNORMAL LOW (ref 150–400)
RBC: 2.66 MIL/uL — ABNORMAL LOW (ref 4.22–5.81)
RDW: 13.8 % (ref 11.5–15.5)
WBC: 8.9 10*3/uL (ref 4.0–10.5)

## 2014-01-19 LAB — GLUCOSE, CAPILLARY
GLUCOSE-CAPILLARY: 203 mg/dL — AB (ref 70–99)
Glucose-Capillary: 227 mg/dL — ABNORMAL HIGH (ref 70–99)
Glucose-Capillary: 264 mg/dL — ABNORMAL HIGH (ref 70–99)
Glucose-Capillary: 267 mg/dL — ABNORMAL HIGH (ref 70–99)

## 2014-01-19 LAB — PREPARE RBC (CROSSMATCH)

## 2014-01-19 MED ORDER — SODIUM CHLORIDE 0.9 % IV SOLN
Freq: Once | INTRAVENOUS | Status: AC
  Administered 2014-01-19: 10:00:00 via INTRAVENOUS

## 2014-01-19 MED ORDER — DIPHENHYDRAMINE HCL 25 MG PO CAPS
25.0000 mg | ORAL_CAPSULE | Freq: Once | ORAL | Status: AC
  Administered 2014-01-19: 25 mg via ORAL
  Filled 2014-01-19: qty 1

## 2014-01-19 MED ORDER — INSULIN DETEMIR 100 UNIT/ML ~~LOC~~ SOLN
55.0000 [IU] | Freq: Every day | SUBCUTANEOUS | Status: DC
  Administered 2014-01-19 – 2014-01-20 (×2): 55 [IU] via SUBCUTANEOUS
  Filled 2014-01-19 (×3): qty 0.55

## 2014-01-19 MED ORDER — FUROSEMIDE 10 MG/ML IJ SOLN
20.0000 mg | Freq: Once | INTRAMUSCULAR | Status: AC
  Administered 2014-01-19: 20 mg via INTRAVENOUS
  Filled 2014-01-19: qty 2

## 2014-01-19 MED ORDER — ACETAMINOPHEN 325 MG PO TABS
650.0000 mg | ORAL_TABLET | Freq: Once | ORAL | Status: AC
  Administered 2014-01-19: 650 mg via ORAL
  Filled 2014-01-19: qty 2

## 2014-01-19 NOTE — Progress Notes (Signed)
Clinical Social Work Department BRIEF PSYCHOSOCIAL ASSESSMENT 01/19/2014  Patient:  Gregory Hatfield, Gregory Hatfield     Account Number:  [id number]     Admit date:  01/17/2014  Clinical Social Worker:  Rolinda Roan  Date/Time:  01/19/2014 04:05 PM  Referred by:  Physician  Date Referred:  01/17/2014 Referred for  SNF Placement   Other Referral:   Interview type:  Patient Other interview type:    PSYCHOSOCIAL DATA Living Status:  ALONE Admitted from facility:   Level of care:   Primary support name:  Gregory Hatfield Primary support relationship to patient:  CHILD, ADULT Degree of support available:   Good support per patient.    CURRENT CONCERNS  Other Concerns:    SOCIAL WORK ASSESSMENT / PLAN Clinical Social Worker (CSW) met with patient to discuss SNF placement. CSW introduced self and explained role of CSW department. Patient reported that he lives alone in [place] and his daughter Velva Harman is in town from [place]. Patient reported that he wants to go to the SNF on [facility] which is [facility]   Assessment/plan status:  Psychosocial Support/Ongoing Assessment of Needs Other assessment/ plan:   Information/referral to community resources:    PATIENT'S/FAMILY'S RESPONSE TO PLAN OF CARE: Patient thanked CSW for visit and assisting with placement process.

## 2014-01-19 NOTE — Progress Notes (Addendum)
     Subjective:  Patient reports pain as mild.  Walking with pt.  Pain 6/10.  Denies light headedness or dizziness.  Objective:   VITALS:   Filed Vitals:   01/18/14 2000 01/18/14 2203 01/18/14 2304 01/19/14 0700  BP:  129/44  109/52  Pulse:  68  62  Temp:  99.7 F (37.6 C)  98.7 F (37.1 C)  TempSrc:  Oral    Resp: 18 18 18 18   Height:      Weight:      SpO2: 96% 96% 96% 98%    Neurologically intact Dorsiflexion/Plantar flexion intact Incision: scant drainage New dressing applied.  Mepilex.    Lab Results  Component Value Date   WBC 8.9 01/19/2014   HGB 7.7* 01/19/2014   HCT 23.4* 01/19/2014   MCV 88.0 01/19/2014   PLT 129* 01/19/2014   BMET    Component Value Date/Time   NA 138 01/19/2014 0312   K 3.9 01/19/2014 0312   CL 103 01/19/2014 0312   CO2 23 01/19/2014 0312   GLUCOSE 217* 01/19/2014 0312   BUN 30* 01/19/2014 0312   CREATININE 1.65* 01/19/2014 0312   CREATININE 1.47* 12/11/2013 1621   CALCIUM 8.2* 01/19/2014 0312   GFRNONAA 36* 01/19/2014 0312   GFRNONAA 81* 12/11/2013 1621   GFRAA 42* 01/19/2014 0312   GFRAA 49* 12/11/2013 1621     Assessment/Plan: 2 Days Post-Op   Principal Problem:   Status post left hip replacement Active Problems:   Hyperlipidemia   Hypertension   GERD   BPH (benign prostatic hyperplasia)   Alzheimer's disease   DM type 2, uncontrolled, with renal complications   Chronic renal insufficiency, stage III (moderate)   T1 IDDM w/ Neuropathy   Osteoarthritis of left hip   Postoperative anemia due to acute blood loss   Will observe h/h, recheck in am, ABLA moderate, may need blood tomorrow if significant drop.  No history of MI/CAD per patient.    ADDENDUM: Upon further review, he does have a fair cardiac history, and will give 2 units prbcs with lasix between given history of CHF.  Recheck h/h in am.   Callahan Wild P 01/19/2014, 9:53 AM   Marchia Bond, MD Cell [phone]

## 2014-01-19 NOTE — Progress Notes (Signed)
Physical Therapy Treatment Patient Details Name: Gregory Hatfield MRN: [id number] DOB: Feb 20, 1926 Today's Date: 01/19/2014    History of Present Illness 80 y.o. male admitted to [facility] on 01/17/14 for elective L THA.  Pt with significant PMHx of DM, CHF, HTN, glaucoma with diabetic retinopathy, CAD, CVA, and lumbar/cervical spine surgeries.     PT Comments    Pt progressing with mobility as evident in ability to increase ambulation distance.  Cont to recommend SNF for further physical therapy to maximize functional mobility.    Follow Up Recommendations  SNF     Equipment Recommendations  None recommended by PT    Recommendations for Other Services       Precautions / Restrictions Precautions Precautions: Posterior Hip;Fall Precaution Comments: Pt able to verbalize 2/3 hip precautions.  Reviewed all 3 precautions Restrictions LLE Weight Bearing: Weight bearing as tolerated    Mobility  Bed Mobility               General bed mobility comments: Pt sitting in recliner upon arrival  Transfers Overall transfer level: Needs assistance Equipment used: Rolling walker (2 wheeled) Transfers: Sit to/from Stand Sit to Stand: Min assist Stand pivot transfers: Min assist       General transfer comment: cues for hand placement & LLE positioning when sitting.    Ambulation/Gait Ambulation/Gait assistance: Min guard Ambulation Distance (Feet): 25 Feet Assistive device: Rolling walker (2 wheeled) Gait Pattern/deviations: Step-to pattern;Decreased weight shift to left;Decreased step length - right;Antalgic Gait velocity: decreased   General Gait Details: cues for sequencing, stay closer to RW, posture, & encouragement to increase LLE WBing    Stairs            Wheelchair Mobility    Modified Rankin (Stroke Patients Only)       Balance                                    Cognition   Behavior During Therapy: WFL for tasks  assessed/performed Overall Cognitive Status: History of cognitive impairments - at baseline       Memory: Decreased recall of precautions              Exercises Total Joint Exercises Ankle Circles/Pumps: AROM;Both;10 reps Heel Slides: AAROM;Strengthening;Left;10 reps Hip ABduction/ADduction: AAROM;Strengthening;Left;10 reps Straight Leg Raises: AAROM;Strengthening;Left;10 reps Long Arc Quad: Strengthening;Left;10 reps    General Comments        Pertinent Vitals/Pain No pain reported.     Home Living                      Prior Function            PT Goals (current goals can now be found in the care plan section) Acute Rehab PT Goals Patient Stated Goal: to walk again PT Goal Formulation: With patient Time For Goal Achievement: 01/24/14 Potential to Achieve Goals: Good Progress towards PT goals: Progressing toward goals    Frequency  7X/week    PT Plan Current plan remains appropriate    Co-evaluation             End of Session   Activity Tolerance: Patient tolerated treatment well Patient left: in chair;with call bell/phone within reach;with chair alarm set     Time: 0935-1000 PT Time Calculation (min): 25 min  Charges:  $Gait Training: 8-22 mins $Therapeutic Exercise: 8-22 mins  G Codes:      Sena Hitch 01/19/2014, 12:40 PM  Sarajane Marek, PTA [phone] 01/19/2014

## 2014-01-19 NOTE — Progress Notes (Addendum)
Clinical Social Work Department CLINICAL SOCIAL WORK PLACEMENT NOTE 01/19/2014  Patient:  Gregory Hatfield, Gregory Hatfield  Account Number:  [id number] Admit date:  01/17/2014  Clinical Social Worker:  Blima Rich, Latanya Presser  Date/time:  01/19/2014 04:03 PM  Clinical Social Work is seeking post-discharge placement for this patient at the following level of care:   [facility]   (*CSW will update this form in Epic as items are completed)   01/19/2014  Patient/family provided with [facility] Department of Clinical Social Work's list of facilities offering this level of care within the geographic area requested by the patient (or if unable, by the patient's family).  01/19/2014  Patient/family informed of their freedom to choose among providers that offer the needed level of care, that participate in Medicare, Medicaid or managed care program needed by the patient, have an available bed and are willing to accept the patient.  01/19/2014  Patient/family informed of [facility]' ownership interest in [facility], as well as of the fact that they are under no obligation to receive care at this facility.  PASARR submitted to EDS on 01/19/2014 PASARR number received on 01/19/2014  FL2 transmitted to all facilities in geographic area requested by pt/family on  01/19/2014 FL2 transmitted to all facilities within larger geographic area on   Patient informed that his/her managed care company has contracts with or will negotiate with  certain facilities, including the following:     Patient/family informed of bed offers received:   Patient chooses bed at [facility], [place], [phone]) Physician recommends and patient chooses bed at    Patient to be transferred to [facility] on 01/21/14 ([place], [place], [phone]) Patient to be transferred to facility by [facility], [place], [phone]) Patient and family notified of transfer on 01/21/14 ([place], [place],  [phone]) Name of family member notified:  Velva Harman, daughter, notified by phone by [place] [facility] [place], [place], [phone])  The following physician request were entered in Epic:   Additional Comments:

## 2014-01-19 NOTE — Progress Notes (Signed)
[facility] PROGRESS NOTE  Gregory Hatfield [id number] DOB: Mar 08, 1975 DOA: 01/17/2014 PCP: Alesia Richards, MD  Assessment/Plan: IDDM  -With diabetic neuropathy and retinopathy  -Glucose has been consistently in the 200's -Initially on Levemir 45 units, transition back on his home dose of 55 units of Levemir insulin.  -On SSI coverage Hypertension  -Perioperative mild bradycardia noted, reduced Coreg dose.  -Continue diltiazem and lisinopril.  -HR has remained stable thus far Mild perioperative hypothermia  -Resolved.  -Normal TSH.  CKD stage IV  - baseline creatinine anywhere between 1.4-1.6, - Cont on ACE inhibitor and follow renal fx Elective left hip total replacement  -We'll defer management to primary team.  -Patient is on Xarelto   HPI/Subjective: No complaints  Objective: Filed Vitals:   01/18/14 2000 01/18/14 2203 01/18/14 2304 01/19/14 0700  BP:  129/44  109/52  Pulse:  68  62  Temp:  99.7 F (37.6 C)  98.7 F (37.1 C)  TempSrc:  Oral    Resp: 18 18 18 18   Height:      Weight:      SpO2: 96% 96% 96% 98%    Intake/Output Summary (Last 24 hours) at 01/19/14 0953 Last data filed at 01/19/14 0925  Gross per 24 hour  Intake 1308.75 ml  Output    600 ml  Net 708.75 ml   Filed Weights   01/17/14 0606  Weight: 88.905 kg (196 lb)    Exam:   General:  Awake, in nad  Cardiovascular: regular, s1, s2  Respiratory: normal resp effort, no wheezing  Abdomen: soft, nondistended  Musculoskeletal: perfused, no clubbing   Data Reviewed: Basic Metabolic Panel:  Recent Labs Lab 01/18/14 0422 01/19/14 0312  NA 139 138  K 4.2 3.9  CL 104 103  CO2 24 23  GLUCOSE 199* 217*  BUN 25* 30*  CREATININE 1.35 1.65*  CALCIUM 8.4 8.2*   Liver Function Tests: No results found for this basename: AST, ALT, ALKPHOS, BILITOT, PROT, ALBUMIN,  in the last 168 hours No results found for this basename: LIPASE, AMYLASE,  in the last 168 hours No  results found for this basename: AMMONIA,  in the last 168 hours CBC:  Recent Labs Lab 01/18/14 0422 01/19/14 0312  WBC 6.3 8.9  HGB 9.0* 7.7*  HCT 27.6* 23.4*  MCV 88.2 88.0  PLT 145* 129*   Cardiac Enzymes: No results found for this basename: CKTOTAL, CKMB, CKMBINDEX, TROPONINI,  in the last 168 hours BNP (last 3 results) No results found for this basename: PROBNP,  in the last 8760 hours CBG:  Recent Labs Lab 01/18/14 0629 01/18/14 1046 01/18/14 1633 01/18/14 2204 01/19/14 0659  GLUCAP 207* 237* 241* 215* 203*    Recent Results (from the past 240 hour(s))  URINE CULTURE     Status: None   Collection Time    01/09/14  1:45 PM      Result Value Ref Range Status   Specimen Description URINE, CLEAN CATCH   Final   Special Requests ADDED AT 1755 ON [id number]   Final   Culture  Setup Time     Final   Value: 01/09/2014 19:14     Performed at [facility]     Final   Value: 15,000 COLONIES/ML     Performed at [facility]   Culture     Final   Value: Multiple bacterial morphotypes present, none predominant. Suggest appropriate recollection if clinically indicated.  Performed at [facility]   Report Status 01/10/2014 FINAL   Final  SURGICAL PCR SCREEN     Status: Abnormal   Collection Time    01/09/14  1:46 PM      Result Value Ref Range Status   MRSA, PCR POSITIVE (*) NEGATIVE Final   Staphylococcus aureus POSITIVE (*) NEGATIVE Final   Comment:            The Xpert SA Assay (FDA     approved for NASAL specimens     in patients over 27 years of age),     is one component of     a comprehensive surveillance     program.  Test performance has     been validated by [employer] for patients greater     than or equal to 52 year old.     It is not intended     to diagnose infection nor to     guide or monitor treatment.     Studies: Dg Pelvis Portable  01/17/2014   CLINICAL DATA:  Left total hip arthroplasty.  EXAM:  PORTABLE PELVIS 1-2 VIEWS; PORTABLE LEFT HIP - 1 VIEW  COMPARISON:  None.  FINDINGS: Well seated components of a total left hip arthroplasty. No complicating features are demonstrated.  IMPRESSION: Well seated components of a total left hip arthroplasty.   Electronically Signed   By: Kalman Jewels M.D.   On: 01/17/2014 12:13   Dg Hip Portable 1 View Left  01/17/2014   CLINICAL DATA:  Left total hip arthroplasty.  EXAM: PORTABLE PELVIS 1-2 VIEWS; PORTABLE LEFT HIP - 1 VIEW  COMPARISON:  None.  FINDINGS: Well seated components of a total left hip arthroplasty. No complicating features are demonstrated.  IMPRESSION: Well seated components of a total left hip arthroplasty.   Electronically Signed   By: Kalman Jewels M.D.   On: 01/17/2014 12:13    Scheduled Meds: . atorvastatin  10 mg Oral q1800  . brimonidine  1 drop Both Eyes TID  . brinzolamide  1 drop Both Eyes TID  . carvedilol  6.25 mg Oral BID WC  . Chlorhexidine Gluconate Cloth  6 each Topical Q0600  . cholecalciferol  5,000 Units Oral Daily  . diltiazem  240 mg Oral QHS  . docusate sodium  100 mg Oral BID  . furosemide  40 mg Oral Daily  . hydrALAZINE  25 mg Oral BID  . insulin aspart  0-5 Units Subcutaneous QHS  . insulin aspart  0-9 Units Subcutaneous TID WC  . insulin detemir  45 Units Subcutaneous QHS  . latanoprost  1 drop Right Eye QHS  . latanoprost  1 drop Right Eye QHS  . lisinopril  10 mg Oral Daily  . magnesium gluconate  500 mg Oral Daily  . mirabegron ER  25 mg Oral Daily  . multivitamin with minerals  1 tablet Oral Daily  . mupirocin ointment  1 application Nasal BID  . rivaroxaban  10 mg Oral Daily   Continuous Infusions:   Principal Problem:   Status post left hip replacement Active Problems:   Hyperlipidemia   Hypertension   GERD   BPH (benign prostatic hyperplasia)   Alzheimer's disease   DM type 2, uncontrolled, with renal complications   Chronic renal insufficiency, stage III (moderate)   T1 IDDM  w/ Neuropathy   Osteoarthritis of left hip    Time spent: 32min  CHIU, Crow[place] K  [facility]  Pager [phone]. If 7PM-7AM, please contact night-coverage at [email or web address], password [facility] 01/19/2014, 9:53 AM  LOS: 2 days

## 2014-01-20 ENCOUNTER — Encounter (HOSPITAL_COMMUNITY): Payer: Self-pay | Admitting: Orthopedic Surgery

## 2014-01-20 DIAGNOSIS — K5900 Constipation, unspecified: Secondary | ICD-10-CM

## 2014-01-20 LAB — TYPE AND SCREEN
ABO/RH(D): O POS
Antibody Screen: NEGATIVE
UNIT DIVISION: 0
Unit division: 0

## 2014-01-20 LAB — CBC
HCT: 26.7 % — ABNORMAL LOW (ref 39.0–52.0)
Hemoglobin: 9 g/dL — ABNORMAL LOW (ref 13.0–17.0)
MCH: 29.4 pg (ref 26.0–34.0)
MCHC: 33.7 g/dL (ref 30.0–36.0)
MCV: 87.3 fL (ref 78.0–100.0)
Platelets: 129 10*3/uL — ABNORMAL LOW (ref 150–400)
RBC: 3.06 MIL/uL — AB (ref 4.22–5.81)
RDW: 14.6 % (ref 11.5–15.5)
WBC: 10.9 10*3/uL — AB (ref 4.0–10.5)

## 2014-01-20 LAB — BASIC METABOLIC PANEL
Anion gap: 12 (ref 5–15)
BUN: 30 mg/dL — ABNORMAL HIGH (ref 6–23)
CALCIUM: 8.6 mg/dL (ref 8.4–10.5)
CO2: 24 mEq/L (ref 19–32)
Chloride: 103 mEq/L (ref 96–112)
Creatinine, Ser: 1.55 mg/dL — ABNORMAL HIGH (ref 0.50–1.35)
GFR calc Af Amer: 45 mL/min — ABNORMAL LOW (ref >90)
GFR calc non Af Amer: 39 mL/min — ABNORMAL LOW (ref >90)
Glucose, Bld: 170 mg/dL — ABNORMAL HIGH (ref 70–99)
Potassium: 3.8 mEq/L (ref 3.7–5.3)
SODIUM: 139 meq/L (ref 137–147)

## 2014-01-20 LAB — GLUCOSE, CAPILLARY
GLUCOSE-CAPILLARY: 147 mg/dL — AB (ref 70–99)
GLUCOSE-CAPILLARY: 247 mg/dL — AB (ref 70–99)
Glucose-Capillary: 199 mg/dL — ABNORMAL HIGH (ref 70–99)
Glucose-Capillary: 232 mg/dL — ABNORMAL HIGH (ref 70–99)

## 2014-01-20 MED ORDER — MAGNESIUM CITRATE PO SOLN
1.0000 | Freq: Once | ORAL | Status: AC
  Administered 2014-01-20: 1 via ORAL
  Filled 2014-01-20: qty 296

## 2014-01-20 MED ORDER — DOCUSATE SODIUM 100 MG PO CAPS: 100.0000 mg | ORAL_CAPSULE | Freq: Two times a day (BID) | ORAL | Status: DC

## 2014-01-20 MED ORDER — BISACODYL 10 MG RE SUPP: 10.0000 mg | Freq: Once | RECTAL | Status: DC

## 2014-01-20 NOTE — Progress Notes (Signed)
Subjective: 3 Days Post-Op Procedure(s) (LRB): LEFT TOTAL HIP ARTHROPLASTY (Left) Patient reports pain as mild.   Hgb improved with transfusion yesterday Patient feeling good, very happy with his pain control  Objective: Vital signs in last 24 hours: Temp:  [97.4 F (36.3 C)-99.1 F (37.3 C)] 98.6 F (37 C) (08/03 0611) Pulse Rate:  [52-68] 52 (08/03 0611) Resp:  [16-20] 20 (08/03 0611) BP: (120-138)/(44-57) 120/49 mmHg (08/03 0611) SpO2:  [95 %-100 %] 95 % (08/03 0611)  Intake/Output from previous day: 08/02 0701 - 08/03 0700 In: 1589.2 [P.O.:960; Blood:629.2] Out: 350 [Urine:350] Intake/Output this shift:     Recent Labs  01/18/14 0422 01/19/14 0312 01/20/14 0540  HGB 9.0* 7.7* 9.0*    Recent Labs  01/19/14 0312 01/20/14 0540  WBC 8.9 10.9*  RBC 2.66* 3.06*  HCT 23.4* 26.7*  PLT 129* 129*    Recent Labs  01/18/14 0422 01/19/14 0312  NA 139 138  K 4.2 3.9  CL 104 103  CO2 24 23  BUN 25* 30*  CREATININE 1.35 1.65*  GLUCOSE 199* 217*  CALCIUM 8.4 8.2*   No results found for this basename: LABPT, INR,  in the last 72 hours  Neurovascular intact Sensation intact distally Intact pulses distally Dorsiflexion/Plantar flexion intact Incision: dressing C/D/I No cellulitis present  Assessment/Plan: 3 Days Post-Op Procedure(s) (LRB): LEFT TOTAL HIP ARTHROPLASTY (Left) Discharge home with home health   Chriss Czar 01/20/2014, 10:06 AM

## 2014-01-20 NOTE — Progress Notes (Signed)
CSW spoke with [facility] re: Gregory Hatfield.  No auth given yet, facility to call CSW when Gregory Hatfield is secured.  RN and pt informed.

## 2014-01-20 NOTE — Clinical Documentation Improvement (Signed)
Possible Clinical Conditions?   Please clarify conflicting diagnosis, is patient Type 1 or Type 2 DM?    _______Diabetes Type 1  or 2 _______Controlled or uncontrolled  Manifestations:  _______DM retinopathy  _______DM PVD _______DM neuropathy   _______DM nephropathy   ______Other Condition _______Cannot Clinically determine   Supporting Information: Per 01/19/14 MD Progress notes: DM type 2, uncontrolled, with renal complications  Chronic renal insufficiency, stage III (moderate)  T1 IDDM w/ Neuropathy  Per 01/17/14 MD Consult Note: DM type I with diabetic neuropathy and retinopathy.     Thank You, Serena Colonel ,RN Clinical Documentation Specialist:  [place] Information Management

## 2014-01-20 NOTE — Progress Notes (Signed)
CSW spoke with pt.'s dtr,Rita regarding placement and pt has chosen [facility]. Pt has [employer]. SW will pass this information to covering CSW.   Gregory Hatfield [phone]

## 2014-01-20 NOTE — Clinical Social Work Note (Signed)
CSW spoke with Daughter at bedside.  Pt awaiting BM prior to being dc'd.  Covering CSW to assist with disposition needs including [employer] authorization.  Pt to dc to [facility] upon dc.  Nonnie Done, [place] [phone]  Clinical Social Work

## 2014-01-20 NOTE — Progress Notes (Signed)
Physical Therapy Treatment Patient Details Name: Gregory Hatfield MRN: [id number] DOB: 1926-05-30 Today's Date: 01/20/2014    History of Present Illness 80 y.o. male admitted to [facility] on 01/17/14 for elective L THA.  Pt with significant PMHx of DM, CHF, HTN, glaucoma with diabetic retinopathy, CAD, CVA, and lumbar/cervical spine surgeries.     PT Comments    Patient progressing well. Patient had to turn back to room to use the restroom. Patient incontinent of bladder and required assistance to change and clean up. Exercise limited this session. Will reattempt next session. Continue to recommend SNF for ongoing Physical Therapy.   a  Follow Up Recommendations  SNF     Equipment Recommendations  None recommended by PT    Recommendations for Other Services       Precautions / Restrictions Precautions Precautions: Posterior Hip;Fall Precaution Comments: Pt able to verbalize 2/3 hip precautions.  Reviewed all 3 precautions Restrictions LLE Weight Bearing: Weight bearing as tolerated    Mobility  Bed Mobility Overal bed mobility: Needs Assistance Bed Mobility: Supine to Sit     Supine to sit: Min guard     General bed mobility comments: Patient cued for safe positioning and precautions but able to complete without physical assist  Transfers Overall transfer level: Needs assistance Equipment used: Rolling walker (2 wheeled)   Sit to Stand: Min assist         General transfer comment: cues for hand placement & LLE positioning when sitting.    Ambulation/Gait Ambulation/Gait assistance: Min guard Ambulation Distance (Feet): 50 Feet Assistive device: Rolling walker (2 wheeled) Gait Pattern/deviations: Step-to pattern;Decreased step length - right;Decreased step length - left Gait velocity: decreased   General Gait Details: cues for sequencing, stay closer to RW, posture, & encouragement to increase LLE [employer]    Stairs            Wheelchair Mobility     Modified Rankin (Stroke Patients Only)       Balance                                    Cognition Arousal/Alertness: Awake/alert Behavior During Therapy: WFL for tasks assessed/performed Overall Cognitive Status: History of cognitive impairments - at baseline       Memory: Decreased recall of precautions              Exercises Total Joint Exercises Long Arc Quad: Strengthening;Left;10 reps    General Comments        Pertinent Vitals/Pain Denied pain    Home Living                      Prior Function            PT Goals (current goals can now be found in the care plan section) Progress towards PT goals: Progressing toward goals    Frequency  7X/week    PT Plan Current plan remains appropriate    Co-evaluation             End of Session Equipment Utilized During Treatment: Gait belt Activity Tolerance: Patient tolerated treatment well Patient left: in chair;with call bell/phone within reach;with chair alarm set     Time: [id number] PT Time Calculation (min): 24 min  Charges:  $Gait Training: 8-22 mins $Therapeutic Exercise: 8-22 mins  G Codes:      Jacqualyn Posey 01/20/2014, 12:43 PM  01/20/2014 Jacqualyn Posey PTA [phone] pager [phone] office

## 2014-01-20 NOTE — Clinical Documentation Improvement (Signed)
Possible Clinical Conditions?  Chronic Systolic Congestive Heart Failure Chronic Diastolic Congestive Heart Failure Chronic Systolic & Diastolic Congestive Heart Failure Acute Systolic Congestive Heart Failure Acute Diastolic Congestive Heart Failure Acute Systolic & Diastolic Congestive Heart Failure Acute on Chronic Systolic Congestive Heart Failure Acute on Chronic Diastolic Congestive Heart Failure Acute on Chronic Systolic & Diastolic Congestive Heart Failure Other Condition________________________________________ Cannot Clinically Determine  Supporting Information:  Per 01/19/14 MD progress notes: Upon further review, he does have a fair cardiac history, and will give 2 units prbcs with lasix between given history of CHF.    Thank You, Serena Colonel ,RN Clinical Documentation Specialist:  [place] Information Management

## 2014-01-20 NOTE — Discharge Instructions (Signed)
Diet: As you were doing prior to hospitalization   Activity: Increase activity slowly as tolerated  No lifting or driving for 6 weeks   Shower: May shower without a dressing on post op day #5, NO SOAKING in tub   Dressing: You may change your dressing on post op day #3.  Then change the dressing daily with sterile 4"x4"s gauze dressing  And TED hose for knees. Use paper tape to hold dressing in place  For hips. You may clean the incision with alcohol prior to redressing.   Weight Bearing: weight bearing as taught in physical therapy. Use a walker or  Crutches as instructed.   To prevent constipation: you may use a stool softener such as -  Colace ( over the counter) 100 mg by mouth twice a day  Drink plenty of fluids ( prune juice may be helpful) and high fiber foods  Miralax ( over the counter) for constipation as needed.   Precautions: If you experience chest pain or shortness of breath - call 911 immediately For transfer to the hospital emergency department!!  If you develop a fever greater that 101 F, purulent drainage from wound, increased redness or drainage from wound, or calf pain -- Call the office   Follow- Up Appointment: Please call for an appointment to be seen in 2 weeks  [place] - [phone]   MAY RESTART [place] OFF OF [place] on my medicine - XARELTO (Rivaroxaban)  This medication education was reviewed with me or my healthcare representative as part of my discharge preparation.    Why was Xarelto prescribed for you? Xarelto was prescribed for you to reduce the risk of blood clots forming after orthopedic surgery. The medical term for these abnormal blood clots is venous thromboembolism (VTE).  What do you need to know about xarelto ? Take your Xarelto ONCE DAILY at the same time every day. You may take it either with or without food.  If you have difficulty swallowing the tablet whole, you may crush it and mix in  applesauce just prior to taking your dose.  Take Xarelto exactly as prescribed by your doctor and DO NOT stop taking Xarelto without talking to the doctor who prescribed the medication.  Stopping without other VTE prevention medication to take the place of Xarelto may increase your risk of developing a clot.  After discharge, you should have regular check-up appointments with your healthcare provider that is prescribing your Xarelto.    What do you do if you miss a dose? If you miss a dose, take it as soon as you remember on the same day then continue your regularly scheduled once daily regimen the next day. Do not take two doses of Xarelto on the same day.   Important Safety Information A possible side effect of Xarelto is bleeding. You should call your healthcare provider right away if you experience any of the following:   Bleeding from an injury or your nose that does not stop.   Unusual colored urine (red or dark brown) or unusual colored stools (red or black).   Unusual bruising for unknown reasons.   A serious fall or if you hit your head (even if there is no bleeding).  Some medicines may interact with Xarelto and might increase your risk of bleeding while on Xarelto. To help avoid this, consult your healthcare provider or pharmacist prior to using any new prescription or non-prescription medications, including herbals, vitamins, non-steroidal anti-inflammatory drugs (NSAIDs) and supplements.  This website has more information on Xarelto: [email or web address]

## 2014-01-20 NOTE — Progress Notes (Addendum)
[facility] PROGRESS NOTE  Gregory Hatfield [id number] DOB: Jan 25, 1926 DOA: 01/17/2014 PCP: Alesia Richards, MD  Assessment/Plan: IDDM AKA DM type 2 -With diabetic neuropathy and retinopathy  -Glucose has been consistently in the 200's -Initially on Levemir 45 units, transition back on his home dose of 55 units of Levemir insulin.  -On SSI coverage  Hypertension  -Perioperative mild bradycardia noted, reduced Coreg dose.  -Continue diltiazem and lisinopril.  -HR has remained stable thus far  Mild perioperative hypothermia  -Resolved.  -Normal TSH.   CKD stage IV  - baseline creatinine anywhere between 1.4-1.6, - Cont on ACE inhibitor and follow renal fx -await today's labs  Elective left hip total replacement  -We'll defer management to primary team.  -Patient is on Xarelto  ABLA -s/p 2 units PRBC  Constipation -mag citrate   HPI/Subjective: No complaints  Objective: Filed Vitals:   01/19/14 1815 01/19/14 2000 01/19/14 2001 01/20/14 0611  BP: 128/44  134/52 120/49  Pulse: 59  62 52  Temp: 98.6 F (37 C)  99.1 F (37.3 C) 98.6 F (37 C)  TempSrc: Oral  Oral Oral  Resp: 18 20 20 20   Height:      Weight:      SpO2: 99% 100% 100% 95%    Intake/Output Summary (Last 24 hours) at 01/20/14 0913 Last data filed at 01/20/14 0513  Gross per 24 hour  Intake 1349.17 ml  Output    350 ml  Net 999.17 ml   Filed Weights   01/17/14 0606  Weight: 88.905 kg (196 lb)    Exam:   General:  Awake, in nad  Cardiovascular: regular, s1, s2  Respiratory: normal resp effort, no wheezing  Abdomen: soft, nondistended  Musculoskeletal: perfused, no clubbing   Data Reviewed: Basic Metabolic Panel:  Recent Labs Lab 01/18/14 0422 01/19/14 0312  NA 139 138  K 4.2 3.9  CL 104 103  CO2 24 23  GLUCOSE 199* 217*  BUN 25* 30*  CREATININE 1.35 1.65*  CALCIUM 8.4 8.2*   Liver Function Tests: No results found for this basename: AST, ALT, ALKPHOS,  BILITOT, PROT, ALBUMIN,  in the last 168 hours No results found for this basename: LIPASE, AMYLASE,  in the last 168 hours No results found for this basename: AMMONIA,  in the last 168 hours CBC:  Recent Labs Lab 01/18/14 0422 01/19/14 0312 01/20/14 0540  WBC 6.3 8.9 10.9*  HGB 9.0* 7.7* 9.0*  HCT 27.6* 23.4* 26.7*  MCV 88.2 88.0 87.3  PLT 145* 129* 129*   Cardiac Enzymes: No results found for this basename: CKTOTAL, CKMB, CKMBINDEX, TROPONINI,  in the last 168 hours BNP (last 3 results) No results found for this basename: PROBNP,  in the last 8760 hours CBG:  Recent Labs Lab 01/19/14 0659 01/19/14 1224 01/19/14 1623 01/19/14 2101 01/20/14 0623  GLUCAP 203* 227* 264* 267* 199*    No results found for this or any previous visit (from the past 240 hour(s)).   Studies: No results found.  Scheduled Meds: . atorvastatin  10 mg Oral q1800  . brimonidine  1 drop Both Eyes TID  . brinzolamide  1 drop Both Eyes TID  . carvedilol  6.25 mg Oral BID WC  . Chlorhexidine Gluconate Cloth  6 each Topical Q0600  . cholecalciferol  5,000 Units Oral Daily  . diltiazem  240 mg Oral QHS  . docusate sodium  100 mg Oral BID  . furosemide  40 mg Oral Daily  . hydrALAZINE  25 mg Oral BID  . insulin aspart  0-5 Units Subcutaneous QHS  . insulin aspart  0-9 Units Subcutaneous TID WC  . insulin detemir  55 Units Subcutaneous QHS  . latanoprost  1 drop Right Eye QHS  . latanoprost  1 drop Right Eye QHS  . lisinopril  10 mg Oral Daily  . magnesium gluconate  500 mg Oral Daily  . mirabegron ER  25 mg Oral Daily  . multivitamin with minerals  1 tablet Oral Daily  . mupirocin ointment  1 application Nasal BID  . rivaroxaban  10 mg Oral Daily   Continuous Infusions:   Principal Problem:   Status post left hip replacement Active Problems:   Hyperlipidemia   Hypertension   GERD   BPH (benign prostatic hyperplasia)   Alzheimer's disease   DM type 2, uncontrolled, with renal  complications   Chronic renal insufficiency, stage III (moderate)   T1 IDDM w/ Neuropathy   Osteoarthritis of left hip   Postoperative anemia due to acute blood loss    Time spent: 5min  Ahnesty Finfrock, [place] [facility] Pager [phone]. If 7PM-7AM, please contact night-coverage at [email or web address], password [facility] 01/20/2014, 9:13 AM  LOS: 3 days

## 2014-01-20 NOTE — Care Management Note (Signed)
CARE MANAGEMENT NOTE 01/20/2014  Patient:  Gregory Hatfield, Gregory Hatfield   Account Number:  [id number]  Date Initiated:  01/20/2014  Documentation initiated by:  Ricki Miller  Subjective/Objective Assessment:   80 yr old male s/p left total hip arthroplasty.     Action/Plan:   Patient is for shortterm rehab at [facility]. Social worker is aware.   Anticipated DC Date:  01/20/2014   Anticipated DC Plan:  SKILLED NURSING FACILITY  In-house referral  Clinical Social Worker      DC Planning Services  CM consult      Choice offered to / List presented to:     DME arranged  NA        [place] arranged  NA      Status of service:  Completed, signed off Medicare Important Message given?  YES (If response is "NO", the following Medicare IM given date fields will be blank) Date Medicare IM given:  01/20/2014 Medicare IM given by:  Ricki Miller Date Additional Medicare IM given:   Additional Medicare IM given by:    Discharge Disposition:  [place]  Per UR Regulation:  Reviewed for med. necessity/level of care/duration of stay  If discussed at [place] of Stay Meetings, dates discussed:

## 2014-01-20 NOTE — Progress Notes (Signed)
Physical Therapy Treatment Patient Details Name: Gregory Hatfield MRN: [id number] DOB: 28-Jun-1925 Today's Date: 01/20/2014    History of Present Illness 80 y.o. male admitted to [facility] on 01/17/14 for elective L THA.  Pt with significant PMHx of DM, CHF, HTN, glaucoma with diabetic retinopathy, CAD, CVA, and lumbar/cervical spine surgeries.     PT Comments    Patient progressing well with overall mobility. Able to increase ambulation this afternoon and increase therex. Patient awaiting insurance auth for SNF. Will continue with current POC  Follow Up Recommendations  SNF     Equipment Recommendations  None recommended by PT    Recommendations for Other Services       Precautions / Restrictions Precautions Precautions: Posterior Hip;Fall Precaution Comments: Pt able to verbalize 2/3 hip precautions.  Reviewed all 3 precautions Restrictions LLE Weight Bearing: Weight bearing as tolerated    Mobility  Bed Mobility Overal bed mobility: Needs Assistance Bed Mobility: Supine to Sit     Supine to sit: Min guard     General bed mobility comments: Patient up in recliner before and after session  Transfers Overall transfer level: Needs assistance Equipment used: Rolling walker (2 wheeled) Transfers: Sit to/from Stand Sit to Stand: Min assist         General transfer comment: cues for hand placement & LLE positioning when sitting.  A to shift weight anterior into upright standing  Ambulation/Gait Ambulation/Gait assistance: Min guard Ambulation Distance (Feet): 80 Feet Assistive device: Rolling walker (2 wheeled) Gait Pattern/deviations: Step-through pattern;Decreased stride length Gait velocity: decreased   General Gait Details: cues for sequencing, stay closer to RW, posture.   Stairs            Wheelchair Mobility    Modified Rankin (Stroke Patients Only)       Balance                                    Cognition Arousal/Alertness:  Awake/alert Behavior During Therapy: WFL for tasks assessed/performed Overall Cognitive Status: History of cognitive impairments - at baseline       Memory: Decreased recall of precautions              Exercises Total Joint Exercises Ankle Circles/Pumps: AROM;Both;10 reps Quad Sets: AROM;Both;10 reps Gluteal Sets: AROM;Both;10 reps Heel Slides: AAROM;Strengthening;Left;10 reps Hip ABduction/ADduction: AAROM;Strengthening;Left;10 reps Long Arc Quad: Strengthening;Left;10 reps    General Comments        Pertinent Vitals/Pain Denied pain    Home Living                      Prior Function            PT Goals (current goals can now be found in the care plan section) Progress towards PT goals: Progressing toward goals    Frequency  7X/week    PT Plan Current plan remains appropriate    Co-evaluation             End of Session Equipment Utilized During Treatment: Gait belt Activity Tolerance: Patient tolerated treatment well Patient left: in chair;with call bell/phone within reach;with chair alarm set     Time: 1405-1431 PT Time Calculation (min): 26 min  Charges:  $Gait Training: 8-22 mins $Therapeutic Exercise: 8-22 mins                    G Codes:  Jacqualyn Posey 01/20/2014, 2:51 PM 01/20/2014 Jacqualyn Posey PTA [phone] pager [phone] office

## 2014-01-21 LAB — GLUCOSE, CAPILLARY
GLUCOSE-CAPILLARY: 140 mg/dL — AB (ref 70–99)
Glucose-Capillary: 204 mg/dL — ABNORMAL HIGH (ref 70–99)

## 2014-01-21 MED ORDER — INSULIN ASPART 100 UNIT/ML ~~LOC~~ SOLN: 0.0000 [IU] | Freq: Every day | SUBCUTANEOUS | Status: DC

## 2014-01-21 MED ORDER — INSULIN ASPART 100 UNIT/ML ~~LOC~~ SOLN: 0.0000 [IU] | Freq: Three times a day (TID) | SUBCUTANEOUS | Status: DC

## 2014-01-21 NOTE — Progress Notes (Signed)
Physical Therapy Treatment Patient Details Name: Gregory Hatfield MRN: [id number] DOB: [birth year] Today's Date: 01/21/2014    History of Present Illness 80 y.o. male admitted to [facility] on 01/17/14 for elective L THA.  Pt with significant PMHx of DM, CHF, HTN, glaucoma with diabetic retinopathy, CAD, CVA, and lumbar/cervical spine surgeries.     PT Comments    Patient continues to make progressing with ambulation and therex. Planning to DC to [facility] later this afternoon.   Follow Up Recommendations  SNF     Equipment Recommendations  None recommended by PT    Recommendations for Other Services       Precautions / Restrictions Precautions Precautions: Posterior Hip;Fall Precaution Comments: Pt able to verbalize 2/3 hip precautions.  Reviewed all 3 precautions Restrictions Weight Bearing Restrictions: Yes LLE Weight Bearing: Weight bearing as tolerated    Mobility  Bed Mobility Overal bed mobility: Needs Assistance       Supine to sit: Min guard;HOB elevated     General bed mobility comments: Use of rails for support. Cues for precautions and safety  Transfers Overall transfer level: Needs assistance Equipment used: Rolling walker (2 wheeled)   Sit to Stand: Min assist         General transfer comment: cues for hand placement & LLE positioning when sitting.  A to shift weight anterior into upright standing  Ambulation/Gait Ambulation/Gait assistance: Min guard Ambulation Distance (Feet): 125 Feet Assistive device: Rolling walker (2 wheeled) Gait Pattern/deviations: Step-to pattern;Decreased step length - left;Decreased step length - right Gait velocity: decreased   General Gait Details: cues for sequencing, stay closer to RW, posture.   Stairs            Wheelchair Mobility    Modified Rankin (Stroke Patients Only)       Balance                                    Cognition Arousal/Alertness: Awake/alert Behavior During  Therapy: WFL for tasks assessed/performed Overall Cognitive Status: History of cognitive impairments - at baseline       Memory: Decreased recall of precautions              Exercises Total Joint Exercises Quad Sets: AROM;Both;15 reps Gluteal Sets: AROM;Both;15 reps Heel Slides: AAROM;Strengthening;Left;15 reps Hip ABduction/ADduction: AAROM;Strengthening;Left;15 reps Straight Leg Raises: AAROM;Strengthening;Left;15 reps Long Arc Quad: Strengthening;Left;15 reps    General Comments        Pertinent Vitals/Pain Denied pain    Home Living                      Prior Function            PT Goals (current goals can now be found in the care plan section) Progress towards PT goals: Progressing toward goals    Frequency  7X/week    PT Plan Current plan remains appropriate    Co-evaluation             End of Session Equipment Utilized During Treatment: Gait belt Activity Tolerance: Patient tolerated treatment well Patient left: in chair;with call bell/phone within reach;with chair alarm set     Time: 1030-1053 PT Time Calculation (min): 23 min  Charges:  $Gait Training: 8-22 mins $Therapeutic Exercise: 8-22 mins                    G Codes:  Jacqualyn Posey 01/21/2014, 11:08 AM 01/21/2014 Jacqualyn Posey PTA [phone] pager [phone] office

## 2014-01-21 NOTE — Discharge Planning (Addendum)
Pt to transfer to [facility], [facility] authorization received per Elzie Rings at [facility] RN to call report to [phone] Transportation: [facility] Daughter, Velva Harman, notified via phone by CSW.  Rita agreeable. [facility] called.   Nonnie Done, [place] [phone]  Clinical Social Work

## 2014-01-21 NOTE — Progress Notes (Signed)
Report called to [facility] spoke with Drucilla Chalet

## 2014-01-21 NOTE — Progress Notes (Signed)
Would d/c patient to SNF on bowel regimine and current medications of DM- SSI and levemir Please re-call if needed [phone]  Gregory Hatfield

## 2014-01-21 NOTE — Progress Notes (Signed)
Patient ID: SIG CLISHAM, male   DOB: Mar 05, 1926, 80 y.o.   MRN: [id number]  Clarification of documentation inquiry from chart review   CHRONIC CHF unclear systolic vs diastolic see's cardiology Dr. Lyman Bishop office notes in [facility].  Insulin Dependent Type 1 Diabetic controlled but erratic blood glucose see's Dr. Loanne Drilling previous notes in [facility]  I do believe he has some neuropathy associated with this.     Also with Chronic Kidney Disease

## 2014-01-21 NOTE — Discharge Summary (Signed)
PATIENT ID: Gregory Hatfield        MRN:  [id number]          DOB/AGE: [birth year] / 78 y.o.    DISCHARGE SUMMARY  ADMISSION DATE:    01/17/2014 DISCHARGE DATE:   01/21/2014   ADMISSION DIAGNOSIS: OA LEFT HIP    DISCHARGE DIAGNOSIS:  OA LEFT HIP    ADDITIONAL DIAGNOSIS: Principal Problem:   Status post left hip replacement Active Problems:   Hyperlipidemia   Hypertension   GERD   BPH (benign prostatic hyperplasia)   Alzheimer's disease   Chronic renal insufficiency, stage III (moderate)   T1 IDDM w/ Neuropathy   Osteoarthritis of left hip   Postoperative anemia due to acute blood loss  Past Medical History  Diagnosis Date  . Diabetes mellitus     insulin-dependent  . CHF (congestive heart failure)   . GERD (gastroesophageal reflux disease)   . Hypertension   . Dyslipidemia   . Glaucoma   . CVA (cerebral infarction)     Left Eye  . Diabetic retinopathy   . CKD (chronic kidney disease)   . BPH (benign prostatic hyperplasia)   . History of nuclear stress test 03/2004    inf wall scar from base to apex & anterolateral wall ischemia  . Hyperlipidemia   . Vitamin D deficiency     PROCEDURE: Procedure(s): LEFT TOTAL HIP ARTHROPLASTY Left on 01/17/2014  CONSULTS: triad hospitalist, PT, OT, SW      HISTORY:  See H&P in chart  HOSPITAL COURSE:  THEOPHILE DUBOW is a 80 y.o. admitted on 01/17/2014 and found to have a diagnosis of OA LEFT HIP.  After appropriate laboratory studies were obtained  they were taken to the operating room on 01/17/2014 and underwent  Procedure(s): LEFT TOTAL HIP ARTHROPLASTY  Left.   They were given perioperative antibiotics:  Anti-infectives   Start     Dose/Rate Route Frequency Ordered Stop   01/17/14 1500  ceFAZolin (ANCEF) IVPB 2 g/50 mL premix     2 g 100 mL/hr over 30 Minutes Intravenous Every 6 hours 01/17/14 1402 01/17/14 2122   01/17/14 0600  ceFAZolin (ANCEF) IVPB 2 g/50 mL premix     2 g 100 mL/hr over 30 Minutes Intravenous  On call to O.R. 01/16/14 1258 01/17/14 0755    .  Tolerated the procedure well.  Placed with a foley intraoperatively.     POD #1, allowed out of bed to a chair.  PT for ambulation and exercise program.  Foley D/C'd in morning.  IV saline locked.  O2 discontionued.  POD #2, continued PT and ambulation.  hgb drop was transfused with 2 units prbc . The remainder of the hospital course was dedicated to ambulation and strengthening.   The patient was discharged on 4 Days Post-Op in  Stable condition.  Blood products given:2 units PRBC  DIAGNOSTIC STUDIES: Recent vital signs: Patient Vitals for the past 24 hrs:  BP Temp Temp src Pulse Resp SpO2  01/21/14 1050 127/69 mmHg - - - - -  01/21/14 0829 127/69 mmHg - - - - -  01/21/14 0636 127/69 mmHg 98.4 F (36.9 C) Oral 68 18 98 %  01/20/14 2145 134/67 mmHg 98.7 F (37.1 C) Oral 64 18 98 %  01/20/14 1300 140/62 mmHg - - 64 20 98 %       Recent laboratory studies:  Recent Labs  01/18/14 0422 01/19/14 0312 01/20/14 0540  WBC 6.3 8.9 10.9*  HGB  9.0* 7.7* 9.0*  HCT 27.6* 23.4* 26.7*  PLT 145* 129* 129*    Recent Labs  01/18/14 0422 01/19/14 0312 01/20/14 0945  NA 139 138 139  K 4.2 3.9 3.8  CL 104 103 103  CO2 24 23 24   BUN 25* 30* 30*  CREATININE 1.35 1.65* 1.55*  GLUCOSE 199* 217* 170*  CALCIUM 8.4 8.2* 8.6   Lab Results  Component Value Date   INR 1.16 01/09/2014     Recent Radiographic Studies :  Dg Pelvis Portable  01/17/2014   CLINICAL DATA:  Left total hip arthroplasty.  EXAM: PORTABLE PELVIS 1-2 VIEWS; PORTABLE LEFT HIP - 1 VIEW  COMPARISON:  None.  FINDINGS: Well seated components of a total left hip arthroplasty. No complicating features are demonstrated.  IMPRESSION: Well seated components of a total left hip arthroplasty.   Electronically Signed   By: Kalman Jewels M.D.   On: 01/17/2014 12:13   Dg Hip Portable 1 View Left  01/17/2014   CLINICAL DATA:  Left total hip arthroplasty.  EXAM: PORTABLE PELVIS 1-2  VIEWS; PORTABLE LEFT HIP - 1 VIEW  COMPARISON:  None.  FINDINGS: Well seated components of a total left hip arthroplasty. No complicating features are demonstrated.  IMPRESSION: Well seated components of a total left hip arthroplasty.   Electronically Signed   By: Kalman Jewels M.D.   On: 01/17/2014 12:13    DISCHARGE INSTRUCTIONS:   DISCHARGE MEDICATIONS:     Medication List    STOP taking these medications       aspirin 81 MG EC tablet     TYLENOL ARTHRITIS PAIN 650 MG CR tablet  Generic drug:  acetaminophen      TAKE these medications       carvedilol 12.5 MG tablet  Commonly known as:  COREG  TAKE 1 TABLET TWICE DAILY.     diltiazem 240 MG 24 hr capsule  Commonly known as:  DILACOR XR  Take 240 mg by mouth at bedtime. For blood pressure     docusate sodium 100 MG capsule  Commonly known as:  COLACE  Take 1 capsule (100 mg total) by mouth 2 (two) times daily.     furosemide 40 MG tablet  Commonly known as:  LASIX  Take 40 mg by mouth daily. For fluid retention     glucose blood test strip  Commonly known as:  ONE TOUCH ULTRA TEST  Use as directed to check blood sugar 6 times a day dx 250.43     hydrALAZINE 25 MG tablet  Commonly known as:  APRESOLINE  Take 25 mg by mouth 2 (two) times daily. For blood pressure     HYDROcodone-acetaminophen 5-325 MG per tablet  Commonly known as:  NORCO  Take 1-2 tablets by mouth every 4 (four) hours as needed for moderate pain.     insulin detemir 100 UNIT/ML injection  Commonly known as:  LEVEMIR  Inject 55 Units into the skin every morning.     LEVEMIR FLEXTOUCH 100 UNIT/ML Pen  Generic drug:  Insulin Detemir  INJECT 65 UNITS INTO THE SKIN DAILY AS DIRECTED.     Insulin Pen Needle 31G X 5 MM Misc  Commonly known as:  B-D UF III MINI PEN NEEDLES  CHECK BLOOD SUGAR 4 TIMES DAILY     lisinopril 20 MG tablet  Commonly known as:  PRINIVIL,ZESTRIL  Take 10 mg by mouth daily.     LUMIGAN 0.01 % Soln  Generic drug:   bimatoprost  Place 1 drop into the right eye at bedtime.     magnesium gluconate 500 MG tablet  Commonly known as:  MAGONATE  Take 500 mg by mouth daily.     MIRALAX PO  Take by mouth.     multivitamin tablet  Take 1 tablet by mouth daily.     MYRBETRIQ 25 MG Tb24 tablet  Generic drug:  mirabegron ER  Take 25 mg by mouth daily. For bladder     pravastatin 40 MG tablet  Commonly known as:  PRAVACHOL  Take 40 mg by mouth daily. For cholesterol     rivaroxaban 10 MG Tabs tablet  Commonly known as:  XARELTO  Take 1 tablet (10 mg total) by mouth daily.     SIMBRINZA 1-0.2 % Susp  Generic drug:  Brinzolamide-Brimonidine  Place 1 drop into both eyes 3 (three) times daily.     Vitamin D3 5000 UNITS Caps  Take 1 capsule by mouth daily.        FOLLOW UP VISIT:       Follow-up Information   Follow up with CAFFREY JR,W D, MD. Schedule an appointment as soon as possible for a visit in 2 weeks.   Specialty:  Orthopedic Surgery   Contact information:   [address] [place] [address] [phone]       DISPOSITION:   Skilled Nursing Facility/Rehab  CONDITION:  Stable  Chriss Czar, PA-C [place] [id number]  01/21/2014 12:02 PM   01/21/2014, 12:02 PM

## 2014-01-22 ENCOUNTER — Other Ambulatory Visit: Payer: Self-pay | Admitting: *Deleted

## 2014-01-22 MED ORDER — HYDROCODONE-ACETAMINOPHEN 5-325 MG PO TABS: 1.0000 | ORAL_TABLET | ORAL | Status: DC | PRN

## 2014-01-22 NOTE — Telephone Encounter (Signed)
[facility] 

## 2014-01-23 ENCOUNTER — Non-Acute Institutional Stay (SKILLED_NURSING_FACILITY): Payer: Medicare PPO | Admitting: Internal Medicine

## 2014-01-23 DIAGNOSIS — D62 Acute posthemorrhagic anemia: Secondary | ICD-10-CM

## 2014-01-23 DIAGNOSIS — F0280 Dementia in other diseases classified elsewhere without behavioral disturbance: Secondary | ICD-10-CM

## 2014-01-23 DIAGNOSIS — G309 Alzheimer's disease, unspecified: Secondary | ICD-10-CM

## 2014-01-23 DIAGNOSIS — M1610 Unilateral primary osteoarthritis, unspecified hip: Secondary | ICD-10-CM

## 2014-01-23 DIAGNOSIS — M1612 Unilateral primary osteoarthritis, left hip: Secondary | ICD-10-CM

## 2014-01-23 DIAGNOSIS — I150 Renovascular hypertension: Secondary | ICD-10-CM

## 2014-01-24 NOTE — Progress Notes (Signed)
HISTORY & PHYSICAL  DATE: 01/23/2014   FACILITY: [facility]  LEVEL OF CARE: SNF (31)  ALLERGIES:  Allergies  Allergen Reactions  . Ace Inhibitors Other (See Comments)    Reaction unknown  . Demerol Other (See Comments)    Reaction unknown  . Flomax [Tamsulosin Hcl] Other (See Comments)    Reaction unknown  . Morphine And Related Other (See Comments)    Reaction unknown  . Oxytrol [Oxybutynin] Rash    CHIEF COMPLAINT:  Manage left hip osteoarthritis , alzheimers dementia and acute blood loss anemia  HISTORY OF PRESENT ILLNESS: Patient is an 80 year old African American male.  HIP OSTEOARTHRITIS: patient had advanced end stage OA of the hip with progressively worsening pain & dysfunction.  Pt failed non-surgical conservative management.  Therefore pt underwent total hip arthroplasty & tolerated the procedure well.  Pt denies hip pain currently.  Pt was admitted to this facility for short term rehabilitation.  ANEMIA: The anemia has been stable. The patient denies fatigue, melena or hematochezia. No complications from the medications currently being used. Postoperatively the patient suffered an acute blood loss. He received 2 units of packed red blood cells. Last hemoglobins on 9, 7.7 and 9.  DEMENTIA: The dementia remaines stable and continues to function adequately in the current living environment with supervision.  The patient has had little changes in behavior. No complications noted from the medications presently being used.  PAST MEDICAL HISTORY :  Past Medical History  Diagnosis Date  . Diabetes mellitus     insulin-dependent  . CHF (congestive heart failure)   . GERD (gastroesophageal reflux disease)   . Hypertension   . Dyslipidemia   . Glaucoma   . CVA (cerebral infarction)     Left Eye  . Diabetic retinopathy   . CKD (chronic kidney disease)   . BPH (benign prostatic hyperplasia)   . History of nuclear stress test 03/2004    inf  wall scar from base to apex & anterolateral wall ischemia  . Hyperlipidemia   . Vitamin D deficiency     PAST SURGICAL HISTORY: Past Surgical History  Procedure Laterality Date  . Cardiac catheterization  04/2004    normal r-sided pressure, mild pulm HTN  . Prostate surgery  10/2010    transurethral resection   . Transthoracic echocardiogram  05/2006    EF 60-70%; mild calcif of MV; mild MV regurg; LA mildly dilated  . Carpal tunnel release Right   . Lumbar spine surgery  2003  . Cervical spine surgery  1990's  . Total hip arthroplasty Left 01/17/2014    Procedure: LEFT TOTAL HIP ARTHROPLASTY;  Surgeon: Yvette Rack., MD;  Location: [facility];  Service: Orthopedics;  Laterality: Left;    SOCIAL HISTORY:  reports that he quit smoking about 30 years ago. He has never used smokeless tobacco. He reports that he drinks alcohol. He reports that he does not use illicit drugs.  FAMILY HISTORY:  Family History  Problem Relation Age of Onset  . Diabetes Mother   . Hypertension Mother   . Prostate cancer Father   . Heart disease Father   . Diabetes Brother   . Cancer Brother   . Heart disease Brother   . Diabetes Sister   . Heart disease Sister   . Hyperlipidemia Child   . Hypertension Child   . Diabetes Child   . Heart attack Child   . Cancer Child  CURRENT MEDICATIONS: Reviewed per MAR/see medication list  REVIEW OF SYSTEMS:  See HPI otherwise 14 point ROS is negative.  PHYSICAL EXAMINATION  VS:  See VS section  GENERAL: no acute distress, moderately obese body habitus EYES: conjunctivae normal, sclerae normal, normal eye lids MOUTH/THROAT: lips without lesions,no lesions in the mouth,tongue is without lesions,uvula elevates in midline NECK: supple, trachea midline, no neck masses, no thyroid tenderness, no thyromegaly LYMPHATICS: no LAN in the neck, no supraclavicular LAN RESPIRATORY: breathing is even & unlabored, BS CTAB CARDIAC: RRR, no murmur,no extra heart sounds,  left lower extremity +1 edema GI:  ABDOMEN: abdomen soft, normal BS, no masses, no tenderness  LIVER/SPLEEN: no hepatomegaly, no splenomegaly MUSCULOSKELETAL: HEAD: normal to inspection  EXTREMITIES: LEFT UPPER EXTREMITY: full range of motion, normal strength & tone RIGHT UPPER EXTREMITY:  full range of motion, normal strength & tone LEFT LOWER EXTREMITY:   range of motion not tested due to surgery, normal strength & tone RIGHT LOWER EXTREMITY:  Moderate range of motion, normal strength & tone PSYCHIATRIC: the patient is alert & oriented to person, affect & behavior appropriate  LABS/RADIOLOGY:  Labs reviewed: Basic Metabolic Panel:  Recent Labs  06/18/13 1727  12/11/13 1621  01/18/14 0422 01/19/14 0312 01/20/14 0945  NA 143  < > 141  < > 139 138 139  K 3.8  < > 4.5  < > 4.2 3.9 3.8  CL 106  < > 106  < > 104 103 103  CO2 31  < > 26  < > 24 23 24   GLUCOSE 93  < > 150*  < > 199* 217* 170*  BUN 28*  < > 30*  < > 25* 30* 30*  CREATININE 1.44*  < > 1.47*  < > 1.35 1.65* 1.55*  CALCIUM 9.4  < > 9.0  < > 8.4 8.2* 8.6  MG 1.9  --  1.9  --   --   --   --   < > = values in this interval not displayed. Liver Function Tests:  Recent Labs  09/10/13 1745 12/11/13 1621 01/09/14 1347  AST 21 24 22   ALT 20 25 22   ALKPHOS 72 68 78  BILITOT 0.3 0.4 0.4  PROT 6.5 6.3 7.2  ALBUMIN 4.0 3.7 3.8   CBC:  Recent Labs  10/04/13 2304 12/11/13 1621 01/09/14 1347 01/18/14 0422 01/19/14 0312 01/20/14 0540  WBC 8.1 5.4 5.7 6.3 8.9 10.9*  NEUTROABS 3.5 2.8 2.8  --   --   --   HGB 12.5* 12.3* 12.8* 9.0* 7.7* 9.0*  HCT 38.5* 37.5* 39.9 27.6* 23.4* 26.7*  MCV 89.3 87.0 90.9 88.2 88.0 87.3  PLT 179 179 178 145* 129* 129*   Lipid Panel:  Recent Labs  06/18/13 1727 09/10/13 1745 12/11/13 1621  HDL 46 45 38*   CBG:  Recent Labs  01/20/14 2137 01/21/14 0625 01/21/14 1108  GLUCAP 247* 140* 204*    PORTABLE PELVIS 1-2 VIEWS; PORTABLE LEFT HIP - 1 VIEW   COMPARISON:   None.   FINDINGS: Well seated components of a total left hip arthroplasty. No complicating features are demonstrated.   IMPRESSION: Well seated components of a total left hip arthroplasty.   PORTABLE PELVIS 1-2 VIEWS; PORTABLE LEFT HIP - 1 VIEW   COMPARISON:  None.   FINDINGS: Well seated components of a total left hip arthroplasty. No complicating features are demonstrated.   IMPRESSION: Well seated components of a total left hip arthroplasty   ASSESSMENT/PLAN:  Left  hip osteoarthritis-status post total hip arthroplasty. Continue rehabilitation. Acute blood loss anemia-status post transfusion. Recheck hemoglobin. Alzheimer's dementia -stable Hypertension-well controlled Diabetes mellitus with neuropathy-continue Levemir Check CBC and BMP  I have reviewed patient's medical records received at admission/from hospitalization.  CPT CODE: 60454  [address], [place] [phone]

## 2014-01-30 ENCOUNTER — Non-Acute Institutional Stay (SKILLED_NURSING_FACILITY): Payer: Medicare PPO | Admitting: Internal Medicine

## 2014-01-30 DIAGNOSIS — N183 Chronic kidney disease, stage 3 unspecified: Secondary | ICD-10-CM

## 2014-01-30 DIAGNOSIS — D473 Essential (hemorrhagic) thrombocythemia: Secondary | ICD-10-CM

## 2014-01-30 DIAGNOSIS — D7289 Other specified disorders of white blood cells: Secondary | ICD-10-CM

## 2014-01-30 DIAGNOSIS — N039 Chronic nephritic syndrome with unspecified morphologic changes: Secondary | ICD-10-CM

## 2014-01-30 DIAGNOSIS — D631 Anemia in chronic kidney disease: Secondary | ICD-10-CM

## 2014-02-04 NOTE — Progress Notes (Signed)
Patient ID: Gregory Hatfield, male   DOB: 1925-07-26, 80 y.o.   MRN: [id number]           PROGRESS NOTE  DATE: 01/30/2014         FACILITY:  [facility]  LEVEL OF CARE: SNF (31)  Acute Visit  CHIEF COMPLAINT:  Manage chronic kidney disease and anemia of chronic kidney disease.    HISTORY OF PRESENT ILLNESS: I was requested by the staff to assess the patient regarding above problem(s):  CHRONIC KIDNEY DISEASE: The patient's chronic kidney disease remains stable.  Patient denies increasing lower extremity swelling or confusion. Last BUN and creatinine are:   On 01/28/2014:  BUN 27, creatinine 1.38.  On 01/20/2014:  BUN 30, creatinine 1.55.    ANEMIA: The anemia has been stable. The patient denies fatigue, melena or hematochezia. No complications from the medications currently being used.  On 01/28/2014:  Hemoglobin 9.9, MCV 87.  On 01/20/2014:  Hemoglobin 9.    PAST MEDICAL HISTORY : Reviewed.  No changes/see problem list  CURRENT MEDICATIONS: Reviewed per MAR/see medication list  REVIEW OF SYSTEMS:  GENERAL: no change in appetite, no fatigue, no weight changes, no fever, chills or weakness RESPIRATORY: no cough, SOB, DOE,, wheezing, hemoptysis CARDIAC: no chest pain, edema or palpitations GI: no abdominal pain, diarrhea, constipation, heart burn, nausea or vomiting  PHYSICAL EXAMINATION  VS: see VS section  GENERAL: no acute distress, moderately obese body habitus EYES: conjunctivae normal, sclerae normal, normal eye lids NECK: supple, trachea midline, no neck masses, no thyroid tenderness, no thyromegaly LYMPHATICS: no LAN in the neck, no supraclavicular LAN RESPIRATORY: breathing is even & unlabored, BS CTAB CARDIAC: RRR, no murmur,no extra heart sounds, left lower extremity has +2 edema, right lower extremity has +1 edema        GI: abdomen soft, normal BS, no masses, no tenderness, no hepatomegaly, no splenomegaly PSYCHIATRIC: the patient is alert &  oriented to person, affect & behavior appropriate  LABS/RADIOLOGY:  On 01/28/2014:  WBC 11.4, platelets 413.    On 01/20/2014:  WBC 10.9, platelets 129.    ASSESSMENT/PLAN:  Chronic kidney disease.  Renal functions improved.    Anemia of chronic kidney disease.  Hemoglobin improved.     Thrombocytosis.  New problem.  Acute phase reactant.  We will monitor.    Leukocytosis.  New problem.  Patient is asymptomatic.  We will monitor.    Left lower extremity edema.  Likely secondary to surgery for arthroplasty.  We will monitor.     CPT CODE: 25956          Gayani Y Dasanayaka, [place] [phone]

## 2014-02-10 DIAGNOSIS — F0280 Dementia in other diseases classified elsewhere without behavioral disturbance: Secondary | ICD-10-CM

## 2014-02-10 DIAGNOSIS — M6281 Muscle weakness (generalized): Secondary | ICD-10-CM

## 2014-02-10 DIAGNOSIS — Z471 Aftercare following joint replacement surgery: Secondary | ICD-10-CM

## 2014-02-10 DIAGNOSIS — G309 Alzheimer's disease, unspecified: Secondary | ICD-10-CM

## 2014-03-03 ENCOUNTER — Other Ambulatory Visit: Payer: Medicare PPO

## 2014-03-05 ENCOUNTER — Other Ambulatory Visit: Payer: Self-pay | Admitting: Internal Medicine

## 2014-03-10 ENCOUNTER — Ambulatory Visit: Payer: Medicare PPO | Admitting: Internal Medicine

## 2014-03-11 ENCOUNTER — Other Ambulatory Visit (HOSPITAL_COMMUNITY): Payer: Self-pay | Admitting: Orthopedic Surgery

## 2014-03-11 DIAGNOSIS — M7989 Other specified soft tissue disorders: Principal | ICD-10-CM

## 2014-03-11 DIAGNOSIS — M79606 Pain in leg, unspecified: Secondary | ICD-10-CM

## 2014-03-13 ENCOUNTER — Ambulatory Visit (HOSPITAL_COMMUNITY)
Admission: RE | Admit: 2014-03-13 | Discharge: 2014-03-13 | Disposition: A | Discharge status: Home / Self Care | Payer: Medicare PPO | Source: Ambulatory Visit | Attending: Cardiovascular Disease | Admitting: Cardiovascular Disease

## 2014-03-13 DIAGNOSIS — M7989 Other specified soft tissue disorders: Secondary | ICD-10-CM | POA: Diagnosis not present

## 2014-03-13 DIAGNOSIS — Z96649 Presence of unspecified artificial hip joint: Secondary | ICD-10-CM | POA: Diagnosis not present

## 2014-03-13 DIAGNOSIS — M79609 Pain in unspecified limb: Secondary | ICD-10-CM | POA: Insufficient documentation

## 2014-03-13 DIAGNOSIS — M79606 Pain in leg, unspecified: Secondary | ICD-10-CM

## 2014-03-13 NOTE — Progress Notes (Signed)
Lower Extremity Venous Duplex Completed. No evidence for DVT or SVT. °Brianna L Mazza,RVT °

## 2014-03-18 ENCOUNTER — Ambulatory Visit: Payer: Medicare PPO | Admitting: Endocrinology

## 2014-03-20 ENCOUNTER — Other Ambulatory Visit: Payer: Self-pay | Admitting: Internal Medicine

## 2014-03-20 ENCOUNTER — Telehealth (HOSPITAL_COMMUNITY): Payer: Self-pay | Admitting: *Deleted

## 2014-03-25 ENCOUNTER — Ambulatory Visit (INDEPENDENT_AMBULATORY_CARE_PROVIDER_SITE_OTHER): Payer: Medicare PPO | Admitting: Endocrinology

## 2014-03-25 ENCOUNTER — Encounter: Payer: Self-pay | Admitting: Endocrinology

## 2014-03-25 VITALS — BP 118/52 | HR 64 | Temp 98.3°F | Ht 73.0 in | Wt 209.0 lb

## 2014-03-25 DIAGNOSIS — E1122 Type 2 diabetes mellitus with diabetic chronic kidney disease: Secondary | ICD-10-CM

## 2014-03-25 DIAGNOSIS — N189 Chronic kidney disease, unspecified: Secondary | ICD-10-CM

## 2014-03-25 LAB — HEMOGLOBIN A1C: HEMOGLOBIN A1C: 7 % — AB (ref 4.6–6.5)

## 2014-03-25 NOTE — Patient Instructions (Addendum)
Please come back for a follow-up appointment in 3 months. check your blood sugar 6 times a day--before the 3 meals, and at bedtime.  also check if you have symptoms of your blood sugar being too high or too low.  please keep a record of the readings and bring it to your next appointment here.  please call us sooner if you are having low blood sugar episodes.   On this type of insulin schedule, you should eat meals on a regular schedule.  If a meal is missed or significantly delayed, your blood sugar could go low. To avoid the lows, carry some packets of cheese and crackers.  This way, you can avoid the lows.   Please reduce the insulin to 50 units each morning.

## 2014-03-25 NOTE — Progress Notes (Signed)
Subjective:    Patient ID: Gregory Hatfield, male    DOB: 20-Jul-1925, 80 y.o.   MRN: [id number]  HPI Pt returns for f/u of diabetes mellitus: DM type: 1 Dx'ed: Q000111Q Complications: renal insuff and CHF Therapy: insulin since 2003 DKA: never Severe hypoglycemia: never Pancreatitis: never Other: in 2008, he was changed to a simple BID insulin regimen, due to noncompliance with multiple daily injections.   Interval history:  he brings a record of his cbg's which i have reviewed today.  It varies from 68-170, but most are approx 100.  There is no trend throughout the day.  pt states he feels well in general.  Past Medical History  Diagnosis Date  . Diabetes mellitus     insulin-dependent  . CHF (congestive heart failure)   . GERD (gastroesophageal reflux disease)   . Hypertension   . Dyslipidemia   . Glaucoma   . CVA (cerebral infarction)     Left Eye  . Diabetic retinopathy   . CKD (chronic kidney disease)   . BPH (benign prostatic hyperplasia)   . History of nuclear stress test 03/2004    inf wall scar from base to apex & anterolateral wall ischemia  . Hyperlipidemia   . Vitamin D deficiency     Past Surgical History  Procedure Laterality Date  . Cardiac catheterization  04/2004    normal r-sided pressure, mild pulm HTN  . Prostate surgery  10/2010    transurethral resection   . Transthoracic echocardiogram  05/2006    EF 60-70%; mild calcif of MV; mild MV regurg; LA mildly dilated  . Carpal tunnel release Right   . Lumbar spine surgery  2003  . Cervical spine surgery  1990's  . Total hip arthroplasty Left 01/17/2014    Procedure: LEFT TOTAL HIP ARTHROPLASTY;  Surgeon: Yvette Rack., MD;  Location: [facility];  Service: Orthopedics;  Laterality: Left;    History   Social History  . Marital Status: Widowed    Spouse Name: N/A    Number of Children: 28  . Years of Education: master's   Occupational History  . teacher/instructor    Social History Main Topics  .  Smoking status: Former Smoker    Quit date: 06/21/1983  . Smokeless tobacco: Never Used  . Alcohol Use: Yes     Comment: wine every now and then  . Drug Use: No  . Sexual Activity: Not on file   Other Topics Concern  . Not on file   Social History Narrative  . No narrative on file    Current Outpatient Prescriptions on File Prior to Visit  Medication Sig Dispense Refill  . carvedilol (COREG) 12.5 MG tablet TAKE  (1)  TABLET TWICE A DAY FOR HIGH BLOOD PRESSURE.  60 tablet  6  . Cholecalciferol (VITAMIN D3) 5000 UNITS CAPS Take 1 capsule by mouth daily.       Marland Kitchen diltiazem (DILACOR XR) 240 MG 24 hr capsule Take 240 mg by mouth at bedtime. For blood pressure      . docusate sodium (COLACE) 100 MG capsule Take 1 capsule (100 mg total) by mouth 2 (two) times daily.  10 capsule  0  . furosemide (LASIX) 40 MG tablet TAKE 1 TABLET TWICE DAILY FOR BLOOD PRESSURE AND FLUIDS.  60 tablet  6  . glucose blood (ONE TOUCH ULTRA TEST) test strip Use as directed to check blood sugar 6 times a day dx 250.43  200 each  5  .  hydrALAZINE (APRESOLINE) 25 MG tablet Take 25 mg by mouth 2 (two) times daily. For blood pressure      . hydrALAZINE (APRESOLINE) 25 MG tablet TAKE  (1)  TABLET  FOUR TIMES DAILY.  120 tablet  1  . HYDROcodone-acetaminophen (NORCO) 5-325 MG per tablet Take 1-2 tablets by mouth every 4 (four) hours as needed for moderate pain.  360 tablet  0  . insulin aspart (NOVOLOG) 100 UNIT/ML injection Inject 0-5 Units into the skin at bedtime.  10 mL  11  . insulin aspart (NOVOLOG) 100 UNIT/ML injection Inject 0-9 Units into the skin 3 (three) times daily with meals.  10 mL  11  . insulin detemir (LEVEMIR) 100 UNIT/ML injection Inject 45 Units into the skin every morning.       . Insulin Pen Needle (B-D UF III MINI PEN NEEDLES) 31G X 5 MM MISC CHECK BLOOD SUGAR 4 TIMES DAILY  100 each  4  . lisinopril (PRINIVIL,ZESTRIL) 20 MG tablet Take 10 mg by mouth daily.       Marland Kitchen LUMIGAN 0.01 % SOLN Place 1 drop  into the right eye at bedtime.       . magnesium gluconate (MAGONATE) 500 MG tablet Take 500 mg by mouth daily.      . Mirabegron ER (MYRBETRIQ) 25 MG TB24 Take 25 mg by mouth daily. For bladder      . Multiple Vitamin (MULTIVITAMIN) tablet Take 1 tablet by mouth daily.        . Polyethylene Glycol 3350 (MIRALAX PO) Take by mouth.      . pravastatin (PRAVACHOL) 40 MG tablet TAKE 1 TABLET ONCE DAILY.  90 tablet  1  . rivaroxaban (XARELTO) 10 MG TABS tablet Take 1 tablet (10 mg total) by mouth daily.  12 tablet  0  . SIMBRINZA 1-0.2 % SUSP Place 1 drop into both eyes 3 (three) times daily.       No current facility-administered medications on file prior to visit.    Allergies  Allergen Reactions  . Ace Inhibitors Other (See Comments)    Reaction unknown  . Demerol Other (See Comments)    Reaction unknown  . Flomax [Tamsulosin Hcl] Other (See Comments)    Reaction unknown  . Morphine And Related Other (See Comments)    Reaction unknown  . Oxytrol [Oxybutynin] Rash    Family History  Problem Relation Age of Onset  . Diabetes Mother   . Hypertension Mother   . Prostate cancer Father   . Heart disease Father   . Diabetes Brother   . Cancer Brother   . Heart disease Brother   . Diabetes Sister   . Heart disease Sister   . Hyperlipidemia Child   . Hypertension Child   . Diabetes Child   . Heart attack Child   . Cancer Child     BP 118/52  Pulse 64  Temp(Src) 98.3 F (36.8 C) (Oral)  Ht 6\' 1"  (1.854 m)  Wt 209 lb (94.802 kg)  BMI 27.58 kg/m2  SpO2 93%    Review of Systems Denies LOC and weight change    Objective:   Physical Exam VITAL SIGNS:  See vs page GENERAL: no distress Pulses: dorsalis pedis intact bilat.  Feet: no deformity. feet are of normal color and temp. 1+ bilat leg edema. There is spotty hyperpigmentation on the legs. There is bilateral onychomycosis.  Skin: no ulcer on the feet.   Neuro: sensation is intact to touch on the feet.  Lab Results    Component Value Date   HGBA1C 7.7* 12/11/2013       Assessment & Plan:  DM: this is the best control this pt should aim for, given this regimen, which does match insulin to her changing needs throughout the day Hypoglycemia: due to insulin: despite this a1c, we need to decrease insulin. Dementia: in this context, we can't accept hypoglycemia as the cause of glycemic control   Patient is advised the following: Patient Instructions  Please come back for a follow-up appointment in 3 months. check your blood sugar 6 times a day--before the 3 meals, and at bedtime.  also check if you have symptoms of your blood sugar being too high or too low.  please keep a record of the readings and bring it to your next appointment here.  please call us sooner if you are having low blood sugar episodes.   On this type of insulin schedule, you should eat meals on a regular schedule.  If a meal is missed or significantly delayed, your blood sugar could go low. To avoid the lows, carry some packets of cheese and crackers.  This way, you can avoid the lows.   Please reduce the insulin to 50 units each morning.

## 2014-04-02 ENCOUNTER — Ambulatory Visit (HOSPITAL_COMMUNITY)
Admission: RE | Admit: 2014-04-02 | Discharge: 2014-04-02 | Disposition: A | Discharge status: Home / Self Care | Payer: Medicare PPO | Source: Ambulatory Visit | Attending: Physician Assistant | Admitting: Physician Assistant

## 2014-04-02 ENCOUNTER — Ambulatory Visit (INDEPENDENT_AMBULATORY_CARE_PROVIDER_SITE_OTHER): Payer: Medicare PPO | Admitting: Physician Assistant

## 2014-04-02 ENCOUNTER — Telehealth: Payer: Self-pay | Admitting: Internal Medicine

## 2014-04-02 VITALS — BP 128/68 | HR 60 | Resp 16 | Wt 208.0 lb

## 2014-04-02 DIAGNOSIS — Z79899 Other long term (current) drug therapy: Secondary | ICD-10-CM

## 2014-04-02 DIAGNOSIS — R159 Full incontinence of feces: Secondary | ICD-10-CM

## 2014-04-02 DIAGNOSIS — R05 Cough: Secondary | ICD-10-CM | POA: Diagnosis not present

## 2014-04-02 DIAGNOSIS — R059 Cough, unspecified: Secondary | ICD-10-CM

## 2014-04-02 DIAGNOSIS — R0602 Shortness of breath: Secondary | ICD-10-CM | POA: Insufficient documentation

## 2014-04-02 DIAGNOSIS — R609 Edema, unspecified: Secondary | ICD-10-CM

## 2014-04-02 DIAGNOSIS — R197 Diarrhea, unspecified: Secondary | ICD-10-CM

## 2014-04-02 DIAGNOSIS — Z23 Encounter for immunization: Secondary | ICD-10-CM

## 2014-04-02 LAB — CBC WITH DIFFERENTIAL/PLATELET
BASOS ABS: 0.1 10*3/uL (ref 0.0–0.1)
BASOS PCT: 1 % (ref 0–1)
EOS PCT: 4 % (ref 0–5)
Eosinophils Absolute: 0.2 10*3/uL (ref 0.0–0.7)
HCT: 35.9 % — ABNORMAL LOW (ref 39.0–52.0)
Hemoglobin: 11.9 g/dL — ABNORMAL LOW (ref 13.0–17.0)
Lymphocytes Relative: 34 % (ref 12–46)
Lymphs Abs: 1.9 10*3/uL (ref 0.7–4.0)
MCH: 29 pg (ref 26.0–34.0)
MCHC: 33.1 g/dL (ref 30.0–36.0)
MCV: 87.3 fL (ref 78.0–100.0)
MONOS PCT: 8 % (ref 3–12)
Monocytes Absolute: 0.4 10*3/uL (ref 0.1–1.0)
Neutro Abs: 3 10*3/uL (ref 1.7–7.7)
Neutrophils Relative %: 53 % (ref 43–77)
Platelets: 199 10*3/uL (ref 150–400)
RBC: 4.11 MIL/uL — ABNORMAL LOW (ref 4.22–5.81)
RDW: 13.6 % (ref 11.5–15.5)
WBC: 5.6 10*3/uL (ref 4.0–10.5)

## 2014-04-02 LAB — HEPATIC FUNCTION PANEL
ALT: 17 U/L (ref 0–53)
AST: 18 U/L (ref 0–37)
Albumin: 3.9 g/dL (ref 3.5–5.2)
Alkaline Phosphatase: 61 U/L (ref 39–117)
BILIRUBIN INDIRECT: 0.2 mg/dL (ref 0.2–1.2)
Bilirubin, Direct: 0.1 mg/dL (ref 0.0–0.3)
Total Bilirubin: 0.3 mg/dL (ref 0.2–1.2)
Total Protein: 6.9 g/dL (ref 6.0–8.3)

## 2014-04-02 LAB — BASIC METABOLIC PANEL WITH GFR
BUN: 30 mg/dL — ABNORMAL HIGH (ref 6–23)
CO2: 26 mEq/L (ref 19–32)
Calcium: 9.3 mg/dL (ref 8.4–10.5)
Chloride: 108 mEq/L (ref 96–112)
Creat: 1.76 mg/dL — ABNORMAL HIGH (ref 0.50–1.35)
GFR, EST AFRICAN AMERICAN: 40 mL/min — AB
GFR, Est Non African American: 34 mL/min — ABNORMAL LOW
GLUCOSE: 152 mg/dL — AB (ref 70–99)
Potassium: 4.2 mEq/L (ref 3.5–5.3)
Sodium: 142 mEq/L (ref 135–145)

## 2014-04-02 LAB — MAGNESIUM: MAGNESIUM: 2 mg/dL (ref 1.5–2.5)

## 2014-04-02 MED ORDER — LOSARTAN POTASSIUM 50 MG PO TABS: 50.0000 mg | ORAL_TABLET | Freq: Every day | ORAL | Status: DC

## 2014-04-02 NOTE — Telephone Encounter (Signed)
Patient called to advise that his G.I doctor is Dr. Amedeo Plenty- [phone].  Thank you, Katrina Judeth [facility] [phone] Fax [phone]

## 2014-04-02 NOTE — Progress Notes (Signed)
Subjective:    Patient ID: Gregory Hatfield, male    DOB: [birth year], 80 y.o.   MRN: [id number]  HPI 80 y.o. AAM with history of DMI with CKD and CHF presents with edema, cough, and stool incontinence s/p left hip total arthoscopy.   He had surgery with Dr. Lorenz Coaster for L THA on 01/17/2014, discharged to Rehab on 01/21/2014 and has been in there for the last two weeks. He has been doing PT and states he is doing very well with his left hip and pain however he has been having some complications since the surgery. He developed bilateral edema while in rehab thought to be due to the surgery, he does have CHF and is on lasix 40mg  BID. Weight is up 12 lbs from 08/13, he has a dry cough, and has had some orthopnea and sleeping elevated. He is on lisinopril as well. Denies CP, SOB, wheezing, mucus.  He has had a negative doppler US on 03/13/2014 negative for DVT  Wt Readings from Last 3 Encounters:  04/02/14 208 lb (94.348 kg)  03/25/14 209 lb (94.802 kg)  01/30/14 196 lb (88.905 kg)   He also states prior to surgery he has been stool softener and miralax, he was taken off while in rehab and took it one day but after this has had diarrhea with black/dark stools, (on iron pill due to chronic anemia from CKD), currently it is soft stools and he is having incontinence, denies abdominal cramping,nausea, decreased appetite, fever chills.  Lab Results  Component Value Date   CREATININE 1.55* 01/20/2014   BUN 30* 01/20/2014   NA 139 01/20/2014   K 3.8 01/20/2014   CL 103 01/20/2014   CO2 24 01/20/2014   Lab Results  Component Value Date   WBC 10.9* 01/20/2014   HGB 9.0* 01/20/2014   HCT 26.7* 01/20/2014   MCV 87.3 01/20/2014   PLT 129* 01/20/2014    Review of Systems SEE HPI    Objective:   Physical Exam  Constitutional: He appears well-nourished. No distress.  HENT:  Head: Normocephalic and atraumatic.  Eyes: Conjunctivae are normal. Pupils are equal, round, and reactive to light.  Neck: Normal range of  motion. Neck supple. No JVD present.  Cardiovascular: Normal rate, regular rhythm and intact distal pulses.  Exam reveals no gallop and no friction rub.   Murmur (2/6 systolic murmur) heard. Pulmonary/Chest: Effort normal. No respiratory distress. He has no wheezes. He has rales (rhonchi versus atelectasis bilateral lower lobes).  Abdominal: Soft. Bowel sounds are normal. He exhibits distension. There is no tenderness. There is no rebound and no guarding.  Musculoskeletal: Normal range of motion. He exhibits edema (3 + edema bilateral legs, left slightly larger than right with stasis dermatitis without ulcers, warmth, erythema, weeping).  Walking with walker  Lymphadenopathy:    He has no cervical adenopathy.  Neurological: He is alert. No cranial nerve deficit.  Skin: Skin is warm and dry.      Assessment & Plan:  1. Need for prophylactic vaccination and inoculation against influenza - Flu vaccine HIGH DOSE PF  2. Cough Stop lisinopril switch to losartan, check CXR/BNP/CBC to rule out CHF/infection - CBC with Differential - BASIC METABOLIC PANEL WITH GFR - Hepatic function panel - Brain natriuretic peptide [id number]) - DG Chest 2 View; Future  3. Edema without warmth/signs of infection, negative [place] for DVT ? From surgery/inactivity versus CHF, get CXR - Brain natriuretic peptide [id number])  4. Stool incontinence Rule out Cdiff with  recent ABX/hospital stay Get on probiotic, and follow up with GI - Gastrointestinal Pathogen Panel PCR  5. Diarrhea -Rule out Cdiff with recent ABX/hospital stay Get on probiotic, and follow up with GI  Gastrointestinal Pathogen Panel PCR  6. Encounter for long-term (current) use of medications - Magnesium   OVER 30 minutes of exam, counseling, chart review.

## 2014-04-02 NOTE — Patient Instructions (Signed)
Stop the lisinopril and start the losartan  Get a chest xray  Edema- elevate your feet 2-3 times a day above your heart, I'm getting a lab on you and may increase your fluid pill  Do the following things EVERYDAY: 1) Weigh yourself in the morning before breakfast. Write it down and keep it in a log. 2) Take your medicines as prescribed 3) Eat low salt foods-Limit salt (sodium) to 2000 mg per day.  4) Stay as active as you can everyday 5) Limit all fluids for the day to less than 2 liters   Please bring back the stool sample to look for an infection called Cdiff Suggest follow up with GI if all test are negative Please start samples of probiotic and stop magnesium if he is on it  If any bad stomach pain, chest pain, dizziness, nausea go to the ER

## 2014-04-03 LAB — BRAIN NATRIURETIC PEPTIDE: Brain Natriuretic Peptide: 94 pg/mL (ref 0.0–100.0)

## 2014-04-09 LAB — GASTROINTESTINAL PATHOGEN PANEL PCR
C. difficile Tox A/B, PCR: NEGATIVE
CRYPTOSPORIDIUM, PCR: NEGATIVE
Campylobacter, PCR: NEGATIVE
E COLI (ETEC) LT/ST, PCR: NEGATIVE
E coli (STEC) stx1/stx2, PCR: NEGATIVE
E coli 0157, PCR: NEGATIVE
Giardia lamblia, PCR: NEGATIVE
NOROVIRUS, PCR: NEGATIVE
Rotavirus A, PCR: NEGATIVE
SHIGELLA, PCR: NEGATIVE
Salmonella, PCR: NEGATIVE

## 2014-04-14 ENCOUNTER — Ambulatory Visit: Payer: Self-pay | Admitting: Physician Assistant

## 2014-04-14 ENCOUNTER — Telehealth: Payer: Self-pay | Admitting: Physician Assistant

## 2014-04-14 NOTE — Telephone Encounter (Signed)
Patient called stating that BP was 111/42, and 90/50. He recently had THR and having edema/cough, lisinopril was switched to losartan 50mg  and he was on lasix 40mg  BID for edema, in addition to his Coreg 12.5BID. If his edema is better we will decrease his lasix to once daily and cut his losartan in half, increase fluids and follow up in the office in 1-2 weeks for OV or nurse check, recheck BMP/CBC/BP. Go to the ER if any CP, SOB, nausea, dizziness, severe HA, changes vision/speech.

## 2014-04-14 NOTE — Telephone Encounter (Signed)
Patient verbalized understanding of instructions per Vicie Mutters, PA

## 2014-04-18 ENCOUNTER — Ambulatory Visit: Payer: Self-pay | Admitting: Physician Assistant

## 2014-04-22 ENCOUNTER — Encounter: Payer: Self-pay | Admitting: Physician Assistant

## 2014-04-28 ENCOUNTER — Other Ambulatory Visit: Payer: Self-pay | Admitting: Endocrinology

## 2014-05-08 ENCOUNTER — Telehealth: Payer: Self-pay | Admitting: Endocrinology

## 2014-05-08 NOTE — Telephone Encounter (Signed)
Pt advised of note below 

## 2014-05-08 NOTE — Telephone Encounter (Signed)
please call patient: Due to variation in his cbg's, Please continue the same insulin.

## 2014-05-08 NOTE — Telephone Encounter (Signed)
Pt med levemir flextouch 24 hrs was decreased and since then he has high blood pressure and sugar. His case manager asked him to call us because his blood sugar is running too high.  Today AM 216 Yesterday AM 154 lunch 164 PM 81 Tuesday AM 152 lunch 93 PM 165

## 2014-05-08 NOTE — Telephone Encounter (Signed)
See below and please advise, Thanks!  

## 2014-05-09 ENCOUNTER — Other Ambulatory Visit: Payer: Self-pay | Admitting: Endocrinology

## 2014-05-22 ENCOUNTER — Telehealth: Payer: Self-pay | Admitting: Endocrinology

## 2014-05-23 ENCOUNTER — Encounter: Payer: Self-pay | Admitting: Endocrinology

## 2014-05-23 ENCOUNTER — Ambulatory Visit (INDEPENDENT_AMBULATORY_CARE_PROVIDER_SITE_OTHER): Payer: Medicare PPO | Admitting: Endocrinology

## 2014-05-23 VITALS — BP 130/60 | HR 59 | Temp 98.0°F | Ht 73.0 in | Wt 213.0 lb

## 2014-05-23 DIAGNOSIS — IMO0002 Reserved for concepts with insufficient information to code with codable children: Secondary | ICD-10-CM

## 2014-05-23 DIAGNOSIS — E1165 Type 2 diabetes mellitus with hyperglycemia: Secondary | ICD-10-CM

## 2014-05-23 DIAGNOSIS — E1129 Type 2 diabetes mellitus with other diabetic kidney complication: Secondary | ICD-10-CM

## 2014-05-23 NOTE — Patient Instructions (Addendum)
Please come back for a follow-up appointment in 3 months.  check your blood sugar 6 times a day--before the 3 meals, and at bedtime.  also check if you have symptoms of your blood sugar being too high or too low.  please keep a record of the readings and bring it to your next appointment here.  please call us sooner if you are having low blood sugar episodes.   On this type of insulin schedule, you should eat meals on a regular schedule.  If a meal is missed or significantly delayed, your blood sugar could go low. To avoid the lows, carry some packets of cheese and crackers.  This way, you can avoid the lows.

## 2014-05-23 NOTE — Progress Notes (Signed)
Subjective:    Patient ID: Gregory Hatfield, male    DOB: 1926/05/24, 80 y.o.   MRN: [id number]  HPI Pt returns for f/u of diabetes mellitus: DM type: 1 Dx'ed: Q000111Q Complications: renal insuff and CHF. Therapy: insulin since 2003 DKA: never Severe hypoglycemia: never Pancreatitis: never Other: in 2008, he was changed to a simple BID insulin regimen, due to noncompliance with multiple daily injections.   Interval history: he brings a record of his cbg's which i have reviewed today.  It varies from 63-200, but most are approx 100.  There is no trend throughout the day.  pt states he feels well in general.   Past Medical History  Diagnosis Date  . Diabetes mellitus     insulin-dependent  . CHF (congestive heart failure)   . GERD (gastroesophageal reflux disease)   . Hypertension   . Dyslipidemia   . Glaucoma   . CVA (cerebral infarction)     Left Eye  . Diabetic retinopathy   . CKD (chronic kidney disease)   . BPH (benign prostatic hyperplasia)   . History of nuclear stress test 03/2004    inf wall scar from base to apex & anterolateral wall ischemia  . Hyperlipidemia   . Vitamin D deficiency     Past Surgical History  Procedure Laterality Date  . Cardiac catheterization  04/2004    normal r-sided pressure, mild pulm HTN  . Prostate surgery  10/2010    transurethral resection   . Transthoracic echocardiogram  05/2006    EF 60-70%; mild calcif of MV; mild MV regurg; LA mildly dilated  . Carpal tunnel release Right   . Lumbar spine surgery  2003  . Cervical spine surgery  1990's  . Total hip arthroplasty Left 01/17/2014    Procedure: LEFT TOTAL HIP ARTHROPLASTY;  Surgeon: Yvette Rack., MD;  Location: [facility];  Service: Orthopedics;  Laterality: Left;    History   Social History  . Marital Status: Widowed    Spouse Name: N/A    Number of Children: 69  . Years of Education: master's   Occupational History  . teacher/instructor    Social History Main Topics  .  Smoking status: Former Smoker    Quit date: 06/21/1983  . Smokeless tobacco: Never Used  . Alcohol Use: Yes     Comment: wine every now and then  . Drug Use: No  . Sexual Activity: Not on file   Other Topics Concern  . Not on file   Social History Narrative    Current Outpatient Prescriptions on File Prior to Visit  Medication Sig Dispense Refill  . carvedilol (COREG) 12.5 MG tablet TAKE  (1)  TABLET TWICE A DAY FOR HIGH BLOOD PRESSURE. 60 tablet 6  . Cholecalciferol (VITAMIN D3) 5000 UNITS CAPS Take 1 capsule by mouth daily.     Marland Kitchen diltiazem (DILACOR XR) 240 MG 24 hr capsule Take 240 mg by mouth at bedtime. For blood pressure    . docusate sodium (COLACE) 100 MG capsule Take 1 capsule (100 mg total) by mouth 2 (two) times daily. 10 capsule 0  . furosemide (LASIX) 40 MG tablet TAKE 1 TABLET TWICE DAILY FOR BLOOD PRESSURE AND FLUIDS. 60 tablet 6  . hydrALAZINE (APRESOLINE) 25 MG tablet TAKE  (1)  TABLET  FOUR TIMES DAILY. 120 tablet 1  . HYDROcodone-acetaminophen (NORCO) 5-325 MG per tablet Take 1-2 tablets by mouth every 4 (four) hours as needed for moderate pain. 360 tablet 0  .  insulin aspart (NOVOLOG) 100 UNIT/ML injection Inject 0-5 Units into the skin at bedtime. 10 mL 11  . insulin detemir (LEVEMIR) 100 UNIT/ML injection Inject 45 Units into the skin every morning.     . Insulin Pen Needle (B-D UF III MINI PEN NEEDLES) 31G X 5 MM MISC CHECK BLOOD SUGAR 4 TIMES DAILY 100 each 4  . losartan (COZAAR) 50 MG tablet Take 1 tablet (50 mg total) by mouth daily. 30 tablet 11  . LUMIGAN 0.01 % SOLN Place 1 drop into the right eye at bedtime.     . magnesium gluconate (MAGONATE) 500 MG tablet Take 500 mg by mouth daily.    . Mirabegron ER (MYRBETRIQ) 25 MG TB24 Take 25 mg by mouth daily. For bladder    . Multiple Vitamin (MULTIVITAMIN) tablet Take 1 tablet by mouth daily.      . Polyethylene Glycol 3350 (MIRALAX PO) Take by mouth.    . pravastatin (PRAVACHOL) 40 MG tablet TAKE 1 TABLET ONCE  DAILY. 90 tablet 1  . rivaroxaban (XARELTO) 10 MG TABS tablet Take 1 tablet (10 mg total) by mouth daily. 12 tablet 0  . SIMBRINZA 1-0.2 % SUSP Place 1 drop into both eyes 3 (three) times daily.    . TRUETEST TEST test strip CHECK BLOOD SUGAR 6 TIMES A DAY. 200 each 2   No current facility-administered medications on file prior to visit.    Allergies  Allergen Reactions  . Ace Inhibitors Other (See Comments)    Reaction unknown  . Demerol Other (See Comments)    Reaction unknown  . Flomax [Tamsulosin Hcl] Other (See Comments)    Reaction unknown  . Morphine And Related Other (See Comments)    Reaction unknown  . Oxytrol [Oxybutynin] Rash    Family History  Problem Relation Age of Onset  . Diabetes Mother   . Hypertension Mother   . Prostate cancer Father   . Heart disease Father   . Diabetes Brother   . Cancer Brother   . Heart disease Brother   . Diabetes Sister   . Heart disease Sister   . Hyperlipidemia Child   . Hypertension Child   . Diabetes Child   . Heart attack Child   . Cancer Child     BP 130/60 mmHg  Pulse 59  Temp(Src) 98 F (36.7 C) (Oral)  Ht 6\' 1"  (1.854 m)  Wt 213 lb (96.616 kg)  BMI 28.11 kg/m2  SpO2 95%    Review of Systems Denies LOC and weight change.    Objective:   Physical Exam VITAL SIGNS:  See vs page GENERAL: no distress Pulses: dorsalis pedis intact bilat.  Feet: no deformity. 1+ bilat leg edema. There is spotty hyperpigmentation on the legs. There is bilateral onychomycosis.   Skin: feet are of normal color and temp. no ulcer on the feet.   Neuro: sensation is intact to touch on the feet.        Assessment & Plan:  DM: variable cbg's limit glycemic control Frail elderly state:  he is not a candidate for aggressive glycemic control.    Patient is advised the following: Patient Instructions  Please come back for a follow-up appointment in 3 months.  check your blood sugar 6 times a day--before the 3 meals, and at  bedtime.  also check if you have symptoms of your blood sugar being too high or too low.  please keep a record of the readings and bring it to your next appointment  here.  please call us sooner if you are having low blood sugar episodes.   On this type of insulin schedule, you should eat meals on a regular schedule.  If a meal is missed or significantly delayed, your blood sugar could go low. To avoid the lows, carry some packets of cheese and crackers.  This way, you can avoid the lows.

## 2014-05-25 LAB — HEMOGLOBIN A1C: Hgb A1c MFr Bld: 9.2 % — ABNORMAL HIGH (ref 4.6–6.5)

## 2014-05-26 ENCOUNTER — Telehealth: Payer: Self-pay | Admitting: Endocrinology

## 2014-05-26 NOTE — Telephone Encounter (Signed)
Pt advised of lab results.  

## 2014-05-26 NOTE — Telephone Encounter (Signed)
Patient was returning call for labs    Please advise

## 2014-05-27 ENCOUNTER — Other Ambulatory Visit: Payer: Self-pay

## 2014-05-27 MED ORDER — DILTIAZEM HCL ER 240 MG PO CP24: 240.0000 mg | ORAL_CAPSULE | Freq: Every day | ORAL | Status: DC

## 2014-06-10 ENCOUNTER — Encounter: Payer: Self-pay | Admitting: Internal Medicine

## 2014-06-10 ENCOUNTER — Ambulatory Visit (INDEPENDENT_AMBULATORY_CARE_PROVIDER_SITE_OTHER): Payer: Medicare PPO | Admitting: Internal Medicine

## 2014-06-10 ENCOUNTER — Other Ambulatory Visit: Payer: Self-pay | Admitting: Internal Medicine

## 2014-06-10 ENCOUNTER — Other Ambulatory Visit: Payer: Self-pay | Admitting: Endocrinology

## 2014-06-10 DIAGNOSIS — E1165 Type 2 diabetes mellitus with hyperglycemia: Secondary | ICD-10-CM

## 2014-06-10 DIAGNOSIS — Z79899 Other long term (current) drug therapy: Secondary | ICD-10-CM

## 2014-06-10 DIAGNOSIS — E1129 Type 2 diabetes mellitus with other diabetic kidney complication: Secondary | ICD-10-CM

## 2014-06-10 DIAGNOSIS — E782 Mixed hyperlipidemia: Secondary | ICD-10-CM

## 2014-06-10 DIAGNOSIS — E559 Vitamin D deficiency, unspecified: Secondary | ICD-10-CM

## 2014-06-10 DIAGNOSIS — IMO0002 Reserved for concepts with insufficient information to code with codable children: Secondary | ICD-10-CM

## 2014-06-10 DIAGNOSIS — I150 Renovascular hypertension: Secondary | ICD-10-CM

## 2014-06-10 LAB — LIPID PANEL
Cholesterol: 135 mg/dL (ref 0–200)
HDL: 43 mg/dL (ref >39)
LDL Cholesterol: 81 mg/dL (ref 0–99)
TRIGLYCERIDES: 56 mg/dL (ref <150)
Total CHOL/HDL Ratio: 3.1 Ratio
VLDL: 11 mg/dL (ref 0–40)

## 2014-06-10 LAB — CBC WITH DIFFERENTIAL/PLATELET
Basophils Absolute: 0 10*3/uL (ref 0.0–0.1)
Basophils Relative: 0 % (ref 0–1)
EOS ABS: 0.1 10*3/uL (ref 0.0–0.7)
Eosinophils Relative: 2 % (ref 0–5)
HCT: 37.6 % — ABNORMAL LOW (ref 39.0–52.0)
HEMOGLOBIN: 12.7 g/dL — AB (ref 13.0–17.0)
LYMPHS ABS: 2.3 10*3/uL (ref 0.7–4.0)
LYMPHS PCT: 40 % (ref 12–46)
MCH: 28 pg (ref 26.0–34.0)
MCHC: 33.8 g/dL (ref 30.0–36.0)
MCV: 82.8 fL (ref 78.0–100.0)
MPV: 11.5 fL (ref 9.4–12.4)
Monocytes Absolute: 0.6 10*3/uL (ref 0.1–1.0)
Monocytes Relative: 11 % (ref 3–12)
Neutro Abs: 2.7 10*3/uL (ref 1.7–7.7)
Neutrophils Relative %: 47 % (ref 43–77)
Platelets: 167 10*3/uL (ref 150–400)
RBC: 4.54 MIL/uL (ref 4.22–5.81)
RDW: 14 % (ref 11.5–15.5)
WBC: 5.7 10*3/uL (ref 4.0–10.5)

## 2014-06-10 LAB — HEPATIC FUNCTION PANEL
ALBUMIN: 4.1 g/dL (ref 3.5–5.2)
ALT: 16 U/L (ref 0–53)
AST: 18 U/L (ref 0–37)
Alkaline Phosphatase: 69 U/L (ref 39–117)
BILIRUBIN INDIRECT: 0.4 mg/dL (ref 0.2–1.2)
BILIRUBIN TOTAL: 0.5 mg/dL (ref 0.2–1.2)
Bilirubin, Direct: 0.1 mg/dL (ref 0.0–0.3)
Total Protein: 7 g/dL (ref 6.0–8.3)

## 2014-06-10 LAB — BASIC METABOLIC PANEL WITH GFR
BUN: 28 mg/dL — ABNORMAL HIGH (ref 6–23)
CO2: 28 mEq/L (ref 19–32)
Calcium: 9.6 mg/dL (ref 8.4–10.5)
Chloride: 106 mEq/L (ref 96–112)
Creat: 1.65 mg/dL — ABNORMAL HIGH (ref 0.50–1.35)
GFR, EST AFRICAN AMERICAN: 43 mL/min — AB
GFR, Est Non African American: 37 mL/min — ABNORMAL LOW
Glucose, Bld: 110 mg/dL — ABNORMAL HIGH (ref 70–99)
Potassium: 4.1 mEq/L (ref 3.5–5.3)
Sodium: 142 mEq/L (ref 135–145)

## 2014-06-10 LAB — MAGNESIUM: Magnesium: 2.1 mg/dL (ref 1.5–2.5)

## 2014-06-10 NOTE — Progress Notes (Signed)
Patient ID: Gregory Hatfield, male   DOB: 1926/05/01, 80 y.o.   MRN: [id number]   This very nice 80 y.o.[facility] presents for  follow up with Hypertension, Hyperlipidemia,SDAT and Vitamin D Deficiency. He was recently seen by Dr Loanne Drilling for his Diabetes.   Patient is treated for HTN & BP has been controlled at home. Today's BP: 140/66 mmHg. Patient has had no complaints of any cardiac type chest pain, palpitations, dyspnea/orthopnea/PND, dizziness, claudication, or dependent edema.   Hyperlipidemia is controlled with diet & meds. Patient denies myalgias or other med SE's. Last Lipids were Total Cholesterol,  144; HDL  38*; LDL  83; Trig 114 on 12/11/2013.   Also, the patient has history of T1_IDDM and controlled has been compromised by his Dementia and poor insight & compliance to control his Diabetes. Patient has apparently had moderate swings in his control but denies symptoms of reactive hypoglycemia, diabetic polys, paresthesias or visual blurring.  Last A1c was  9.2% on  05/23/2014.   Further, the patient also has history of Vitamin D Deficiency and supplements vitamin D without any suspected side-effects. Last vitamin D was  62 on  12/11/2013.   Medication List   diltiazem 240 MG 24 hr capsule  Commonly known as:  CARDIZEM CD     diltiazem 240 MG 24 hr capsule  Commonly known as:  DILACOR XR  Take 1 capsule (240 mg total) by mouth at bedtime. For blood pressure     docusate sodium 100 MG capsule  Commonly known as:  COLACE  Take 1 capsule (100 mg total) by mouth 2 (two) times daily.     furosemide 40 MG tablet  Commonly known as:  LASIX  TAKE 1 TABLET TWICE DAILY FOR BLOOD PRESSURE AND FLUIDS.     hydrALAZINE 25 MG tablet  Commonly known as:  APRESOLINE  TAKE  (1)  TABLET  FOUR TIMES DAILY.     hydrALAZINE 25 MG tablet  Commonly known as:  APRESOLINE  TAKE  (1)  TABLET  FOUR TIMES DAILY.     HYDROcodone-acetaminophen 5-325 MG per tablet  Commonly known as:  NORCO  Take 1-2 tablets  by mouth every 4 (four) hours as needed for moderate pain.     insulin aspart 100 UNIT/ML injection  Commonly known as:  novoLOG  Inject 0-5 Units into the skin at bedtime.     insulin detemir 100 UNIT/ML injection  Commonly known as:  LEVEMIR  Inject 45 Units into the skin every morning.     LEVEMIR FLEXTOUCH 100 UNIT/ML Pen  Generic drug:  Insulin Detemir  INJECT 45 UNITS INTO THE SKIN DAILY AS DIRECTED.     Insulin Pen Needle 31G X 5 MM Misc  Commonly known as:  B-D UF III MINI PEN NEEDLES  CHECK BLOOD SUGAR 4 TIMES DAILY     losartan 50 MG tablet  Commonly known as:  COZAAR  Take 1 tablet (50 mg total) by mouth daily.     LUMIGAN 0.01 % Soln  Generic drug:  bimatoprost  Place 1 drop into the right eye at bedtime.     magnesium gluconate 500 MG tablet  Commonly known as:  MAGONATE  Take 500 mg by mouth daily.     MIRALAX PO  Take by mouth.     multivitamin tablet  Take 1 tablet by mouth daily.     MYRBETRIQ 25 MG Tb24 tablet  Generic drug:  mirabegron ER  Take 25 mg by mouth daily. For bladder  pravastatin 40 MG tablet  Commonly known as:  PRAVACHOL  TAKE 1 TABLET ONCE DAILY.     PROBIOTIC DAILY PO  Take by mouth.     rivaroxaban 10 MG Tabs tablet  Commonly known as:  XARELTO  Take 1 tablet (10 mg total) by mouth daily.     SIMBRINZA 1-0.2 % Susp  Generic drug:  Brinzolamide-Brimonidine  Place 1 drop into both eyes 3 (three) times daily.     TRUETEST TEST test strip  Generic drug:  glucose blood  CHECK BLOOD SUGAR 6 TIMES A DAY.     Vitamin D3 5000 UNITS Caps  Take 1 capsule by mouth daily.     Allergies  Allergen Reactions  . Ace Inhibitors Other (See Comments)    Reaction unknown  . Demerol Other (See Comments)    Reaction unknown  . Flomax [Tamsulosin Hcl] Other (See Comments)    Reaction unknown  . Morphine And Related Other (See Comments)    Reaction unknown  . Oxytrol [Oxybutynin] Rash   PMHx:   Past Medical History  Diagnosis  Date  . Diabetes mellitus     insulin-dependent  . CHF (congestive heart failure)   . GERD (gastroesophageal reflux disease)   . Hypertension   . Dyslipidemia   . Glaucoma   . CVA (cerebral infarction)     Left Eye  . Diabetic retinopathy   . CKD (chronic kidney disease)   . BPH (benign prostatic hyperplasia)   . History of nuclear stress test 03/2004    inf wall scar from base to apex & anterolateral wall ischemia  . Hyperlipidemia   . Vitamin D deficiency    Immunization History  Administered Date(s) Administered  . DTaP 09/28/2006  . Influenza Whole 04/05/2010, 04/04/2013  . Influenza, High Dose Seasonal PF 04/02/2014  . Influenza-Unspecified 02/19/2011  . Pneumococcal Polysaccharide-23 07/21/2001, 03/20/2008  . Td 08/28/2008   Past Surgical History  Procedure Laterality Date  . Cardiac catheterization  04/2004    normal r-sided pressure, mild pulm HTN  . Prostate surgery  10/2010    transurethral resection   . Transthoracic echocardiogram  05/2006    EF 60-70%; mild calcif of MV; mild MV regurg; LA mildly dilated  . Carpal tunnel release Right   . Lumbar spine surgery  2003  . Cervical spine surgery  1990's  . Total hip arthroplasty Left 01/17/2014    Procedure: LEFT TOTAL HIP ARTHROPLASTY;  Surgeon: Yvette Rack., MD;  Location: [facility];  Service: Orthopedics;  Laterality: Left;   FHx:    Reviewed / unchanged  SHx:    Reviewed / unchanged  Systems Review:  Constitutional: Denies fever, chills, wt changes, headaches, insomnia, fatigue, night sweats, change in appetite. Eyes: Denies redness, blurred vision, diplopia, discharge, itchy, watery eyes.  ENT: Denies discharge, congestion, post nasal drip, epistaxis, sore throat, earache, hearing loss, dental pain, tinnitus, vertigo, sinus pain, snoring.  CV: Denies chest pain, palpitations, irregular heartbeat, syncope, dyspnea, diaphoresis, orthopnea, PND, claudication or edema. Respiratory: denies cough, dyspnea, DOE,  pleurisy, hoarseness, laryngitis, wheezing.  Gastrointestinal: Denies dysphagia, odynophagia, heartburn, reflux, water brash, abdominal pain or cramps, nausea, vomiting, bloating, diarrhea, constipation, hematemesis, melena, hematochezia  or hemorrhoids. Genitourinary: Denies dysuria, frequency, urgency, nocturia, hesitancy, discharge, hematuria or flank pain. Musculoskeletal: Denies arthralgias, myalgias, stiffness, jt. swelling, pain, limping or strain/sprain.  Skin: Denies pruritus, rash, hives, warts, acne, eczema or change in skin lesion(s). Neuro: No weakness, tremor, incoordination, spasms, paresthesia or pain. Psychiatric: Denies confusion,  memory loss or sensory loss. Endo: Denies change in weight, skin or hair change.  Heme/Lymph: No excessive bleeding, bruising or enlarged lymph nodes.  Physical Exam  BP 140/66   Pulse 52  Temp 98.2 F   Resp 16  Ht 5' 10.5"  Wt 210 lb   BMI 29.70  Appears well nourished and in no distress. Eyes: PERRLA, EOMs, conjunctiva no swelling or erythema. Sinuses: No frontal/maxillary tenderness ENT/Mouth: EAC's clear, TM's nl w/o erythema, bulging. Nares clear w/o erythema, swelling, exudates. Oropharynx clear without erythema or exudates. Oral hygiene is good. Tongue normal, non obstructing. Hearing intact.  Neck: Supple. Thyroid nl. Car 2+/2+ without bruits, nodes or JVD. Chest: Respirations nl with BS clear & equal w/o rales, rhonchi, wheezing or stridor.  Cor: Heart sounds normal w/ regular rate and rhythm without sig. murmurs, gallops, clicks, or rubs. Peripheral pulses normal and equal  without edema.  Abdomen: Soft & bowel sounds normal. Non-tender w/o guarding, rebound, hernias, masses, or organomegaly.  Lymphatics: Unremarkable.  Musculoskeletal: Full ROM all peripheral extremities, joint stability, 5/5 strength, and normal gait.  Skin: Warm, dry without exposed rashes, lesions or ecchymosis apparent.  Neuro: Cranial nerves intact, reflexes  equal bilaterally. Sensory-motor testing grossly intact. Tendon reflexes grossly intact.  Pysch: Alert & oriented x 3.  Insight and judgement nl & appropriate. No ideations.  Assessment and Plan:  1. Hypertension - Continue monitor blood pressure at home. Continue diet/meds same.  2. Hyperlipidemia - Continue diet/meds, exercise,& lifestyle modifications. Continue monitor periodic cholesterol/liver & renal functions   3. T1_IDDM  - Continue diet, exercise, lifestyle modifications. Monitor appropriate labs.  4. Vitamin D Deficiency - Continue supplementation.  5. SDAT -    Recommended regular exercise, BP monitoring, weight control, and discussed med and SE's. Recommended labs to assess and monitor clinical status. Further disposition pending results of labs.

## 2014-06-11 ENCOUNTER — Telehealth: Payer: Self-pay | Admitting: *Deleted

## 2014-06-11 LAB — VITAMIN D 25 HYDROXY (VIT D DEFICIENCY, FRACTURES): Vit D, 25-Hydroxy: 61 ng/mL (ref 30–100)

## 2014-06-11 LAB — TSH: TSH: 0.764 u[IU]/mL (ref 0.350–4.500)

## 2014-06-11 NOTE — Telephone Encounter (Signed)
Patient called and asked if he should stop his probiotic.  Per Dr Melford Aase,  He should continue.  Left message on machine to inform patient.

## 2014-06-18 NOTE — Telephone Encounter (Signed)
error 

## 2014-06-25 ENCOUNTER — Ambulatory Visit (INDEPENDENT_AMBULATORY_CARE_PROVIDER_SITE_OTHER): Payer: Medicare PPO | Admitting: Endocrinology

## 2014-06-25 ENCOUNTER — Encounter: Payer: Self-pay | Admitting: Endocrinology

## 2014-06-25 VITALS — BP 142/78 | HR 76 | Temp 98.4°F | Ht 73.0 in | Wt 211.0 lb

## 2014-06-25 DIAGNOSIS — IMO0002 Reserved for concepts with insufficient information to code with codable children: Secondary | ICD-10-CM

## 2014-06-25 DIAGNOSIS — E1165 Type 2 diabetes mellitus with hyperglycemia: Secondary | ICD-10-CM

## 2014-06-25 DIAGNOSIS — E1129 Type 2 diabetes mellitus with other diabetic kidney complication: Secondary | ICD-10-CM

## 2014-06-25 NOTE — Patient Instructions (Addendum)
Please come back for a follow-up appointment in 3 months.  check your blood sugar 6 times a day--before the 3 meals, and at bedtime.  also check if you have symptoms of your blood sugar being too high or too low.  please keep a record of the readings and bring it to your next appointment here.  please call us sooner if you are having low blood sugar episodes.   On this type of insulin schedule, you should eat meals on a regular schedule.  If a meal is missed or significantly delayed, your blood sugar could go low. To avoid the lows, carry some packets of cheese and crackers.  This way, you can avoid the lows.   Please increase the levemir to 48 units each morning. Please come back for a follow-up appointment in 6 weeks.

## 2014-06-25 NOTE — Progress Notes (Signed)
Subjective:    Patient ID: Gregory Hatfield, male    DOB: 17-Jun-1926, 81 y.o.   MRN: [id number]  HPI Pt returns for f/u of diabetes mellitus: DM type: 1 Dx'ed: Q000111Q Complications: renal insuff and CHF. Therapy: insulin since 2003 DKA: never Severe hypoglycemia: never Pancreatitis: never Other: he is on a simple insulin regimen, due to noncompliance with multiple daily injections.   Interval history: he brings a scant record of his cbg's which i have reviewed today.  It varies from 141-190.  There is no trend throughout the day.  pt states he feels well in general.   Past Medical History  Diagnosis Date  . Diabetes mellitus     insulin-dependent  . CHF (congestive heart failure)   . GERD (gastroesophageal reflux disease)   . Hypertension   . Dyslipidemia   . Glaucoma   . CVA (cerebral infarction)     Left Eye  . Diabetic retinopathy   . CKD (chronic kidney disease)   . BPH (benign prostatic hyperplasia)   . History of nuclear stress test 03/2004    inf wall scar from base to apex & anterolateral wall ischemia  . Hyperlipidemia   . Vitamin D deficiency     Past Surgical History  Procedure Laterality Date  . Cardiac catheterization  04/2004    normal r-sided pressure, mild pulm HTN  . Prostate surgery  10/2010    transurethral resection   . Transthoracic echocardiogram  05/2006    EF 60-70%; mild calcif of MV; mild MV regurg; LA mildly dilated  . Carpal tunnel release Right   . Lumbar spine surgery  2003  . Cervical spine surgery  1990's  . Total hip arthroplasty Left 01/17/2014    Procedure: LEFT TOTAL HIP ARTHROPLASTY;  Surgeon: Yvette Rack., MD;  Location: [facility];  Service: Orthopedics;  Laterality: Left;    History   Social History  . Marital Status: Widowed    Spouse Name: N/A    Number of Children: 87  . Years of Education: master's   Occupational History  . teacher/instructor    Social History Main Topics  . Smoking status: Former Smoker    Quit  date: 06/21/1983  . Smokeless tobacco: Never Used  . Alcohol Use: Yes     Comment: wine every now and then  . Drug Use: No  . Sexual Activity: Not on file   Other Topics Concern  . Not on file   Social History Narrative    Current Outpatient Prescriptions on File Prior to Visit  Medication Sig Dispense Refill  . carvedilol (COREG) 12.5 MG tablet TAKE  (1)  TABLET TWICE A DAY FOR HIGH BLOOD PRESSURE. 60 tablet 6  . Cholecalciferol (VITAMIN D3) 5000 UNITS CAPS Take 1 capsule by mouth daily.     Marland Kitchen diltiazem (CARDIZEM CD) 240 MG 24 hr capsule     . diltiazem (DILACOR XR) 240 MG 24 hr capsule Take 1 capsule (240 mg total) by mouth at bedtime. For blood pressure 30 capsule 3  . docusate sodium (COLACE) 100 MG capsule Take 1 capsule (100 mg total) by mouth 2 (two) times daily. 10 capsule 0  . furosemide (LASIX) 40 MG tablet TAKE 1 TABLET TWICE DAILY FOR BLOOD PRESSURE AND FLUIDS. 60 tablet 6  . hydrALAZINE (APRESOLINE) 25 MG tablet TAKE  (1)  TABLET  FOUR TIMES DAILY. 120 tablet 1  . hydrALAZINE (APRESOLINE) 25 MG tablet TAKE  (1)  TABLET  FOUR TIMES DAILY.  120 tablet 1  . HYDROcodone-acetaminophen (NORCO) 5-325 MG per tablet Take 1-2 tablets by mouth every 4 (four) hours as needed for moderate pain. 360 tablet 0  . insulin detemir (LEVEMIR) 100 UNIT/ML injection Inject 48 Units into the skin every morning.     . Insulin Pen Needle (B-D UF III MINI PEN NEEDLES) 31G X 5 MM MISC CHECK BLOOD SUGAR 4 TIMES DAILY 100 each 4  . losartan (COZAAR) 50 MG tablet Take 1 tablet (50 mg total) by mouth daily. 30 tablet 11  . LUMIGAN 0.01 % SOLN Place 1 drop into the right eye at bedtime.     . magnesium gluconate (MAGONATE) 500 MG tablet Take 500 mg by mouth daily.    . Mirabegron ER (MYRBETRIQ) 25 MG TB24 Take 25 mg by mouth daily. For bladder    . Multiple Vitamin (MULTIVITAMIN) tablet Take 1 tablet by mouth daily.      . Polyethylene Glycol 3350 (MIRALAX PO) Take by mouth.    . pravastatin (PRAVACHOL)  40 MG tablet TAKE 1 TABLET ONCE DAILY. 90 tablet 1  . Probiotic Product (PROBIOTIC DAILY PO) Take by mouth.    . rivaroxaban (XARELTO) 10 MG TABS tablet Take 1 tablet (10 mg total) by mouth daily. 12 tablet 0  . SIMBRINZA 1-0.2 % SUSP Place 1 drop into both eyes 3 (three) times daily.    . TRUETEST TEST test strip CHECK BLOOD SUGAR 6 TIMES A DAY. 200 each 2   No current facility-administered medications on file prior to visit.    Allergies  Allergen Reactions  . Ace Inhibitors Other (See Comments)    Reaction unknown  . Demerol Other (See Comments)    Reaction unknown  . Flomax [Tamsulosin Hcl] Other (See Comments)    Reaction unknown  . Morphine And Related Other (See Comments)    Reaction unknown  . Oxytrol [Oxybutynin] Rash    Family History  Problem Relation Age of Onset  . Diabetes Mother   . Hypertension Mother   . Prostate cancer Father   . Heart disease Father   . Diabetes Brother   . Cancer Brother   . Heart disease Brother   . Diabetes Sister   . Heart disease Sister   . Hyperlipidemia Child   . Hypertension Child   . Diabetes Child   . Heart attack Child   . Cancer Child     BP 142/78 mmHg  Pulse 76  Temp(Src) 98.4 F (36.9 C) (Oral)  Ht 6\' 1"  (1.854 m)  Wt 211 lb (95.709 kg)  BMI 27.84 kg/m2  SpO2 95%     Review of Systems He denies hypoglycemia and weight change.      Objective:   Physical Exam Vital signs: see vs page Gen: elderly, frail, no distress.   Pulses: dorsalis pedis intact bilat.  Feet: no deformity. 1+ bilat leg edema. There is spotty hyperpigmentation on the legs. There is bilateral onychomycosis.  Skin: feet are of normal color and temp. no ulcer on the feet.  Neuro: sensation is intact to touch on the feet.   Lab Results  Component Value Date   HGBA1C 9.2* 05/23/2014       Assessment & Plan:  DM: mild exacerbation. HTN: mild exacerbation.   Frail elderly state: in this context, Please continue the same medication  for blood pressure.    Patient is advised the following: Patient Instructions  Please come back for a follow-up appointment in 3 months.  check your blood  sugar 6 times a day--before the 3 meals, and at bedtime.  also check if you have symptoms of your blood sugar being too high or too low.  please keep a record of the readings and bring it to your next appointment here.  please call us sooner if you are having low blood sugar episodes.   On this type of insulin schedule, you should eat meals on a regular schedule.  If a meal is missed or significantly delayed, your blood sugar could go low. To avoid the lows, carry some packets of cheese and crackers.  This way, you can avoid the lows.   Please increase the levemir to 48 units each morning. Please come back for a follow-up appointment in 6 weeks.

## 2014-07-07 ENCOUNTER — Other Ambulatory Visit: Payer: Self-pay | Admitting: Endocrinology

## 2014-07-08 ENCOUNTER — Telehealth: Payer: Self-pay

## 2014-07-08 NOTE — Telephone Encounter (Signed)
Patient requested referral to see Dr. Gaynelle Arabian on 07-22-14. Patient's insurance does not require referral. Left message on machine to patient.

## 2014-07-09 ENCOUNTER — Telehealth: Payer: Self-pay | Admitting: *Deleted

## 2014-07-09 NOTE — Telephone Encounter (Signed)
PT NEEDS A [employer] REFERRAL TO Dr Gaynelle Arabian eval of prostate ect to see if surgery is necessary

## 2014-07-10 NOTE — Telephone Encounter (Signed)
Patient does not need referral because he has [facility]. Referrals are only needed for [employer]. Patient aware.

## 2014-07-14 ENCOUNTER — Other Ambulatory Visit: Payer: Self-pay | Admitting: Internal Medicine

## 2014-07-21 ENCOUNTER — Other Ambulatory Visit: Payer: Self-pay | Admitting: Endocrinology

## 2014-07-22 ENCOUNTER — Ambulatory Visit: Payer: Self-pay | Admitting: Internal Medicine

## 2014-07-24 ENCOUNTER — Telehealth: Payer: Self-pay | Admitting: *Deleted

## 2014-07-24 ENCOUNTER — Other Ambulatory Visit: Payer: Self-pay | Admitting: Urology

## 2014-07-24 NOTE — Telephone Encounter (Signed)
Patient called requesting Med list be updated and faxed to Dr. Gaynelle Arabian.

## 2014-08-06 ENCOUNTER — Encounter: Payer: Self-pay | Admitting: Endocrinology

## 2014-08-06 ENCOUNTER — Ambulatory Visit (INDEPENDENT_AMBULATORY_CARE_PROVIDER_SITE_OTHER): Payer: Medicare PPO | Admitting: Endocrinology

## 2014-08-06 VITALS — BP 144/88 | HR 69 | Temp 98.7°F | Ht 73.0 in | Wt 214.0 lb

## 2014-08-06 DIAGNOSIS — E1065 Type 1 diabetes mellitus with hyperglycemia: Principal | ICD-10-CM

## 2014-08-06 DIAGNOSIS — E1029 Type 1 diabetes mellitus with other diabetic kidney complication: Secondary | ICD-10-CM

## 2014-08-06 DIAGNOSIS — IMO0002 Reserved for concepts with insufficient information to code with codable children: Secondary | ICD-10-CM

## 2014-08-06 LAB — HEMOGLOBIN A1C: Hgb A1c MFr Bld: 8.5 % — ABNORMAL HIGH (ref 4.6–6.5)

## 2014-08-06 NOTE — Patient Instructions (Addendum)
Please come back for a follow-up appointment in 3 months.  check your blood sugar 6 times a day--before the 3 meals, and at bedtime.  also check if you have symptoms of your blood sugar being too high or too low.  please keep a record of the readings and bring it to your next appointment here.  please call us sooner if you are having low blood sugar episodes.   On this type of insulin schedule, you should eat meals on a regular schedule.  If a meal is missed or significantly delayed, your blood sugar could go low. To avoid the lows, carry some packets of cheese and crackers.  This way, you can avoid the lows.   Please increase the levemir to 48 units each morning.   Please come back for a follow-up appointment in 3 months.

## 2014-08-06 NOTE — Progress Notes (Signed)
Subjective:    Patient ID: Gregory Hatfield, male    DOB: July 20, 1925, 81 y.o.   MRN: [id number]  HPI Pt returns for f/u of diabetes mellitus: DM type: 1 Dx'ed: Q000111Q Complications: renal insuff and CHF. Therapy: insulin since 2003 DKA: never Severe hypoglycemia: never Pancreatitis: never. Other: he is on a simple insulin regimen, due to noncompliance with multiple daily injections.   Interval history: he brings a scant record of his cbg's which i have reviewed today.  It varies from 55-200. There is no trend throughout the day.  pt states he feels well in general.  Pt says he never misses the insulin.  Past Medical History  Diagnosis Date  . Diabetes mellitus     insulin-dependent  . CHF (congestive heart failure)   . GERD (gastroesophageal reflux disease)   . Hypertension   . Dyslipidemia   . Glaucoma   . CVA (cerebral infarction)     Left Eye  . Diabetic retinopathy   . CKD (chronic kidney disease)   . BPH (benign prostatic hyperplasia)   . History of nuclear stress test 03/2004    inf wall scar from base to apex & anterolateral wall ischemia  . Hyperlipidemia   . Vitamin D deficiency     Past Surgical History  Procedure Laterality Date  . Cardiac catheterization  04/2004    normal r-sided pressure, mild pulm HTN  . Prostate surgery  10/2010    transurethral resection   . Transthoracic echocardiogram  05/2006    EF 60-70%; mild calcif of MV; mild MV regurg; LA mildly dilated  . Carpal tunnel release Right   . Lumbar spine surgery  2003  . Cervical spine surgery  1990's  . Total hip arthroplasty Left 01/17/2014    Procedure: LEFT TOTAL HIP ARTHROPLASTY;  Surgeon: Yvette Rack., MD;  Location: [facility];  Service: Orthopedics;  Laterality: Left;    History   Social History  . Marital Status: Widowed    Spouse Name: N/A  . Number of Children: 5  . Years of Education: master's   Occupational History  . teacher/instructor    Social History Main Topics  .  Smoking status: Former Smoker    Quit date: 06/21/1983  . Smokeless tobacco: Never Used  . Alcohol Use: Yes     Comment: wine every now and then  . Drug Use: No  . Sexual Activity: Not on file   Other Topics Concern  . Not on file   Social History Narrative    Current Outpatient Prescriptions on File Prior to Visit  Medication Sig Dispense Refill  . B-D UF III MINI PEN NEEDLES 31G X 5 MM MISC USE FOR INJECTIONS AS DIRECTED 100 each 2  . carvedilol (COREG) 12.5 MG tablet TAKE  (1)  TABLET TWICE A DAY FOR HIGH BLOOD PRESSURE. 60 tablet 6  . Cholecalciferol (VITAMIN D3) 5000 UNITS CAPS Take 1 capsule by mouth daily.     Marland Kitchen diltiazem (CARDIZEM CD) 240 MG 24 hr capsule Take 240 mg by mouth daily.     Marland Kitchen docusate sodium (COLACE) 100 MG capsule Take 1 capsule (100 mg total) by mouth 2 (two) times daily. 10 capsule 0  . furosemide (LASIX) 40 MG tablet TAKE 1 TABLET TWICE DAILY FOR BLOOD PRESSURE AND FLUIDS. 60 tablet 6  . hydrALAZINE (APRESOLINE) 25 MG tablet TAKE  (1)  TABLET  FOUR TIMES DAILY. 120 tablet 1  . hydrALAZINE (APRESOLINE) 25 MG tablet TAKE  (1)  TABLET  FOUR TIMES DAILY. 120 tablet 1  . insulin detemir (LEVEMIR) 100 UNIT/ML injection Inject 48 Units into the skin every morning.     Marland Kitchen losartan (COZAAR) 50 MG tablet Take 1 tablet (50 mg total) by mouth daily. (Patient taking differently: Take 50 mg by mouth daily. Takes 1/2 pill daily = 25 mg) 30 tablet 11  . LUMIGAN 0.01 % SOLN Place 1 drop into the right eye at bedtime.     . Mirabegron ER (MYRBETRIQ) 25 MG TB24 Take 25 mg by mouth daily. For bladder    . Multiple Vitamin (MULTIVITAMIN) tablet Take 1 tablet by mouth daily.      . Polyethylene Glycol 3350 (MIRALAX PO) Take by mouth.    . pravastatin (PRAVACHOL) 40 MG tablet TAKE 1 TABLET ONCE DAILY. 90 tablet 1  . Probiotic Product (PROBIOTIC DAILY PO) Take by mouth.    Marland Kitchen SIMBRINZA 1-0.2 % SUSP Place 1 drop into both eyes 3 (three) times daily.    . TRUETEST TEST test strip CHECK  BLOOD SUGAR 6 TIMES A DAY. 200 each 2   No current facility-administered medications on file prior to visit.    Allergies  Allergen Reactions  . Ace Inhibitors Other (See Comments)    Reaction unknown  . Demerol Other (See Comments)    Reaction unknown  . Flomax [Tamsulosin Hcl] Other (See Comments)    Reaction unknown  . Morphine And Related Other (See Comments)    Reaction unknown  . Oxytrol [Oxybutynin] Rash    Family History  Problem Relation Age of Onset  . Diabetes Mother   . Hypertension Mother   . Prostate cancer Father   . Heart disease Father   . Diabetes Brother   . Cancer Brother   . Heart disease Brother   . Diabetes Sister   . Heart disease Sister   . Hyperlipidemia Child   . Hypertension Child   . Diabetes Child   . Heart attack Child   . Cancer Child     BP 144/88 mmHg  Pulse 69  Temp(Src) 98.7 F (37.1 C) (Oral)  Ht 6\' 1"  (1.854 m)  Wt 214 lb (97.07 kg)  BMI 28.24 kg/m2  SpO2 97%  Review of Systems Denies LOC and weight change.     Objective:   Physical Exam VITAL SIGNS:  See vs page GENERAL: no distress Pulses: dorsalis pedis intact bilat.  Feet: no deformity. 1+ bilat leg edema. There is spotty hyperpigmentation on the legs. There is bilateral onychomycosis.  Skin: feet are of normal color and temp. no ulcer on the feet.   Neuro: sensation is intact to touch on the feet.  Lab Results  Component Value Date   HGBA1C 8.5* 08/06/2014      Assessment & Plan:  DM: moderate exacerbation Frail elderly state: he is not a candidate for a1c <7%.     Patient is advised the following: Patient Instructions  Please come back for a follow-up appointment in 3 months.  check your blood sugar 6 times a day--before the 3 meals, and at bedtime.  also check if you have symptoms of your blood sugar being too high or too low.  please keep a record of the readings and bring it to your next appointment here.  please call us sooner if you are having low  blood sugar episodes.   On this type of insulin schedule, you should eat meals on a regular schedule.  If a meal is missed or significantly  delayed, your blood sugar could go low. To avoid the lows, carry some packets of cheese and crackers.  This way, you can avoid the lows.   Please increase the levemir to 48 units each morning.   Please come back for a follow-up appointment in 3 months.    clarification: pt is advised to continue levemir, 48 units qam

## 2014-08-08 ENCOUNTER — Telehealth: Payer: Self-pay | Admitting: Endocrinology

## 2014-08-08 NOTE — Telephone Encounter (Signed)
Requested call back from pt to discuss lab work.

## 2014-08-08 NOTE — Telephone Encounter (Signed)
Patient is calling for results of his labs

## 2014-08-08 NOTE — Telephone Encounter (Signed)
Pt advised of recent lab work.

## 2014-08-08 NOTE — Telephone Encounter (Signed)
Pt called to discuss he has asked that you leave the info on the voicemail if he doesn't answer

## 2014-08-18 ENCOUNTER — Other Ambulatory Visit: Payer: Self-pay

## 2014-08-18 MED ORDER — INSULIN DETEMIR 100 UNIT/ML ~~LOC~~ SOLN: 48.0000 [IU] | Freq: Every morning | SUBCUTANEOUS | Status: DC

## 2014-08-18 NOTE — Patient Instructions (Signed)
Gregory Hatfield  08/18/2014   Your procedure is scheduled on: 08/22/2014    Report to [facility] Main  Entrance and follow signs to               Nina at       0730 AM.  Call this number if you have problems the morning of surgery [phone]   Remember:  Do not eat food or drink liquids :After Midnight.     Take these medicines the morning of surgery with A SIP OF WATER:                                You may not have any metal on your body including hair pins and              piercings  Do not wear jewelry,  lotions, powders or perfumes., deodorant.                           Men may shave face and neck.   Do not bring valuables to the hospital. [place].  Contacts, dentures or bridgework may not be worn into surgery.  Leave suitcase in the car. After surgery it may be brought to your room.       Special Instructions: coughing and deep breathing exercises, leg exercises               Please read over the following fact sheets you were given: _____________________________________________________________________             [facility] - Preparing for Surgery Before surgery, you can play an important role.  Because skin is not sterile, your skin needs to be as free of germs as possible.  You can reduce the number of germs on your skin by washing with CHG (chlorahexidine gluconate) soap before surgery.  CHG is an antiseptic cleaner which kills germs and bonds with the skin to continue killing germs even after washing. Please DO NOT use if you have an allergy to CHG or antibacterial soaps.  If your skin becomes reddened/irritated stop using the CHG and inform your nurse when you arrive at Short Stay. Do not shave (including legs and underarms) for at least 48 hours prior to the first CHG shower.  You may shave your face/neck. Please follow these instructions carefully:  1.  Shower with CHG Soap  the night before surgery and the  morning of Surgery.  2.  If you choose to wash your hair, wash your hair first as usual with your  normal  shampoo.  3.  After you shampoo, rinse your hair and body thoroughly to remove the  shampoo.                           4.  Use CHG as you would any other liquid soap.  You can apply chg directly  to the skin and wash                       Gently with a scrungie or clean washcloth.  5.  Apply the CHG Soap to your body ONLY FROM THE  NECK DOWN.   Do not use on face/ open                           Wound or open sores. Avoid contact with eyes, ears mouth and genitals (private parts).                       Wash face,  Genitals (private parts) with your normal soap.             6.  Wash thoroughly, paying special attention to the area where your surgery  will be performed.  7.  Thoroughly rinse your body with warm water from the neck down.  8.  DO NOT shower/wash with your normal soap after using and rinsing off  the CHG Soap.                9.  Pat yourself dry with a clean towel.            10.  Wear clean pajamas.            11.  Place clean sheets on your bed the night of your first shower and do not  sleep with pets. Day of Surgery : Do not apply any lotions/deodorants the morning of surgery.  Please wear clean clothes to the hospital/surgery center.  FAILURE TO FOLLOW THESE INSTRUCTIONS MAY RESULT IN THE CANCELLATION OF YOUR SURGERY PATIENT SIGNATURE_________________________________  NURSE SIGNATURE__________________________________  ________________________________________________________________________

## 2014-08-19 ENCOUNTER — Encounter (HOSPITAL_COMMUNITY)
Admission: RE | Admit: 2014-08-19 | Discharge: 2014-08-19 | Disposition: A | Discharge status: Home / Self Care | Payer: Medicare PPO | Source: Ambulatory Visit | Attending: Urology | Admitting: Urology

## 2014-08-19 ENCOUNTER — Encounter (HOSPITAL_COMMUNITY): Payer: Self-pay

## 2014-08-19 DIAGNOSIS — Z888 Allergy status to other drugs, medicaments and biological substances status: Secondary | ICD-10-CM | POA: Diagnosis not present

## 2014-08-19 DIAGNOSIS — Z886 Allergy status to analgesic agent status: Secondary | ICD-10-CM | POA: Diagnosis not present

## 2014-08-19 DIAGNOSIS — Z9849 Cataract extraction status, unspecified eye: Secondary | ICD-10-CM | POA: Diagnosis not present

## 2014-08-19 DIAGNOSIS — R351 Nocturia: Secondary | ICD-10-CM | POA: Diagnosis not present

## 2014-08-19 DIAGNOSIS — E119 Type 2 diabetes mellitus without complications: Secondary | ICD-10-CM | POA: Diagnosis not present

## 2014-08-19 DIAGNOSIS — Z8673 Personal history of transient ischemic attack (TIA), and cerebral infarction without residual deficits: Secondary | ICD-10-CM | POA: Diagnosis not present

## 2014-08-19 DIAGNOSIS — N401 Enlarged prostate with lower urinary tract symptoms: Secondary | ICD-10-CM | POA: Diagnosis present

## 2014-08-19 DIAGNOSIS — Z7982 Long term (current) use of aspirin: Secondary | ICD-10-CM | POA: Diagnosis not present

## 2014-08-19 DIAGNOSIS — H409 Unspecified glaucoma: Secondary | ICD-10-CM | POA: Diagnosis not present

## 2014-08-19 DIAGNOSIS — D631 Anemia in chronic kidney disease: Secondary | ICD-10-CM | POA: Diagnosis not present

## 2014-08-19 DIAGNOSIS — E291 Testicular hypofunction: Secondary | ICD-10-CM | POA: Diagnosis not present

## 2014-08-19 DIAGNOSIS — Z794 Long term (current) use of insulin: Secondary | ICD-10-CM | POA: Diagnosis not present

## 2014-08-19 DIAGNOSIS — N183 Chronic kidney disease, stage 3 (moderate): Secondary | ICD-10-CM | POA: Diagnosis not present

## 2014-08-19 DIAGNOSIS — C61 Malignant neoplasm of prostate: Secondary | ICD-10-CM | POA: Diagnosis not present

## 2014-08-19 DIAGNOSIS — K219 Gastro-esophageal reflux disease without esophagitis: Secondary | ICD-10-CM | POA: Diagnosis not present

## 2014-08-19 DIAGNOSIS — I129 Hypertensive chronic kidney disease with stage 1 through stage 4 chronic kidney disease, or unspecified chronic kidney disease: Secondary | ICD-10-CM | POA: Diagnosis not present

## 2014-08-19 DIAGNOSIS — Z96649 Presence of unspecified artificial hip joint: Secondary | ICD-10-CM | POA: Diagnosis not present

## 2014-08-19 DIAGNOSIS — Z87891 Personal history of nicotine dependence: Secondary | ICD-10-CM | POA: Diagnosis not present

## 2014-08-19 DIAGNOSIS — I509 Heart failure, unspecified: Secondary | ICD-10-CM | POA: Diagnosis not present

## 2014-08-19 LAB — CBC
HCT: 39.8 % (ref 39.0–52.0)
Hemoglobin: 12.6 g/dL — ABNORMAL LOW (ref 13.0–17.0)
MCH: 29.2 pg (ref 26.0–34.0)
MCHC: 31.7 g/dL (ref 30.0–36.0)
MCV: 92.3 fL (ref 78.0–100.0)
Platelets: 167 10*3/uL (ref 150–400)
RBC: 4.31 MIL/uL (ref 4.22–5.81)
RDW: 14.1 % (ref 11.5–15.5)
WBC: 5.7 10*3/uL (ref 4.0–10.5)

## 2014-08-19 LAB — BASIC METABOLIC PANEL
ANION GAP: 7 (ref 5–15)
BUN: 32 mg/dL — ABNORMAL HIGH (ref 6–23)
CHLORIDE: 108 mmol/L (ref 96–112)
CO2: 29 mmol/L (ref 19–32)
CREATININE: 1.64 mg/dL — AB (ref 0.50–1.35)
Calcium: 9.3 mg/dL (ref 8.4–10.5)
GFR calc Af Amer: 42 mL/min — ABNORMAL LOW (ref >90)
GFR calc non Af Amer: 36 mL/min — ABNORMAL LOW (ref >90)
Glucose, Bld: 149 mg/dL — ABNORMAL HIGH (ref 70–99)
Potassium: 4 mmol/L (ref 3.5–5.1)
Sodium: 144 mmol/L (ref 135–145)

## 2014-08-19 LAB — SURGICAL PCR SCREEN
MRSA, PCR: NEGATIVE
STAPHYLOCOCCUS AUREUS: NEGATIVE

## 2014-08-19 NOTE — Progress Notes (Signed)
EKG- 12/12/13 EPIC  CXR- 03/2014 EPIC  08/06/14- LOV- DR Loanne Drilling- EPIC  12/12/13 LOV with Dr Debara Pickett- EPIC

## 2014-08-19 NOTE — Progress Notes (Signed)
At the time of preop appointment patient was unsure of the times he took his medications.  He stated he had a list at home on the table which he forgot to bring.  I allowed patient to get home then I called him back to instruct him which medications to take am of surgery.  I instructed patient to take Hydralazine, Carvedilol, and Mybetriq.  Patient verbalized back to nurse three medications listed above to take am of surgery.  Also instructed patient not to take any Insulin morning of surgery.  Patient voiced understanding.

## 2014-08-19 NOTE — Progress Notes (Signed)
BMp results done 08/19/2014 faxed via EPIC to Dr Gaynelle Arabian.

## 2014-08-19 NOTE — Progress Notes (Signed)
When I called patient regarding times of medication regimen, patient told me he had some Mupirocin ointment from 12/2013 with expiration date if  04/2015.

## 2014-08-22 ENCOUNTER — Observation Stay (HOSPITAL_COMMUNITY)
Admission: RE | Admit: 2014-08-22 | Discharge: 2014-08-23 | Disposition: A | Discharge status: Home / Self Care | Payer: Medicare PPO | Source: Ambulatory Visit | Attending: Urology | Admitting: Urology

## 2014-08-22 ENCOUNTER — Ambulatory Visit (HOSPITAL_COMMUNITY): Payer: Medicare PPO | Admitting: Anesthesiology

## 2014-08-22 ENCOUNTER — Ambulatory Visit: Payer: Medicare PPO | Admitting: Endocrinology

## 2014-08-22 ENCOUNTER — Encounter (HOSPITAL_COMMUNITY)
Admission: RE | Disposition: A | Discharge status: Home / Self Care | Payer: Self-pay | Source: Ambulatory Visit | Attending: Urology

## 2014-08-22 ENCOUNTER — Encounter (HOSPITAL_COMMUNITY): Payer: Self-pay | Admitting: *Deleted

## 2014-08-22 DIAGNOSIS — E119 Type 2 diabetes mellitus without complications: Secondary | ICD-10-CM | POA: Insufficient documentation

## 2014-08-22 DIAGNOSIS — Z9849 Cataract extraction status, unspecified eye: Secondary | ICD-10-CM | POA: Insufficient documentation

## 2014-08-22 DIAGNOSIS — E291 Testicular hypofunction: Secondary | ICD-10-CM | POA: Insufficient documentation

## 2014-08-22 DIAGNOSIS — R351 Nocturia: Secondary | ICD-10-CM | POA: Insufficient documentation

## 2014-08-22 DIAGNOSIS — Z8673 Personal history of transient ischemic attack (TIA), and cerebral infarction without residual deficits: Secondary | ICD-10-CM | POA: Insufficient documentation

## 2014-08-22 DIAGNOSIS — C61 Malignant neoplasm of prostate: Secondary | ICD-10-CM | POA: Insufficient documentation

## 2014-08-22 DIAGNOSIS — I509 Heart failure, unspecified: Secondary | ICD-10-CM | POA: Insufficient documentation

## 2014-08-22 DIAGNOSIS — K219 Gastro-esophageal reflux disease without esophagitis: Secondary | ICD-10-CM | POA: Insufficient documentation

## 2014-08-22 DIAGNOSIS — Z7982 Long term (current) use of aspirin: Secondary | ICD-10-CM | POA: Insufficient documentation

## 2014-08-22 DIAGNOSIS — N183 Chronic kidney disease, stage 3 (moderate): Secondary | ICD-10-CM | POA: Insufficient documentation

## 2014-08-22 DIAGNOSIS — D631 Anemia in chronic kidney disease: Secondary | ICD-10-CM | POA: Insufficient documentation

## 2014-08-22 DIAGNOSIS — Z794 Long term (current) use of insulin: Secondary | ICD-10-CM | POA: Insufficient documentation

## 2014-08-22 DIAGNOSIS — H409 Unspecified glaucoma: Secondary | ICD-10-CM | POA: Diagnosis not present

## 2014-08-22 DIAGNOSIS — I129 Hypertensive chronic kidney disease with stage 1 through stage 4 chronic kidney disease, or unspecified chronic kidney disease: Secondary | ICD-10-CM | POA: Insufficient documentation

## 2014-08-22 DIAGNOSIS — Z888 Allergy status to other drugs, medicaments and biological substances status: Secondary | ICD-10-CM | POA: Insufficient documentation

## 2014-08-22 DIAGNOSIS — N401 Enlarged prostate with lower urinary tract symptoms: Principal | ICD-10-CM | POA: Insufficient documentation

## 2014-08-22 DIAGNOSIS — Z886 Allergy status to analgesic agent status: Secondary | ICD-10-CM | POA: Insufficient documentation

## 2014-08-22 DIAGNOSIS — Z96649 Presence of unspecified artificial hip joint: Secondary | ICD-10-CM | POA: Insufficient documentation

## 2014-08-22 DIAGNOSIS — Z87891 Personal history of nicotine dependence: Secondary | ICD-10-CM | POA: Insufficient documentation

## 2014-08-22 HISTORY — PX: TRANSURETHRAL RESECTION OF PROSTATE: SHX73

## 2014-08-22 LAB — GLUCOSE, CAPILLARY
GLUCOSE-CAPILLARY: 180 mg/dL — AB (ref 70–99)
GLUCOSE-CAPILLARY: 157 mg/dL — AB (ref 70–99)
GLUCOSE-CAPILLARY: 137 mg/dL — AB (ref 70–99)
Glucose-Capillary: 238 mg/dL — ABNORMAL HIGH (ref 70–99)

## 2014-08-22 SURGERY — TURP (TRANSURETHRAL RESECTION OF PROSTATE)
Anesthesia: General

## 2014-08-22 MED ORDER — HYDRALAZINE HCL 25 MG PO TABS: 25.0000 mg | ORAL_TABLET | Freq: Three times a day (TID) | ORAL | Status: DC

## 2014-08-22 MED ORDER — ACETAMINOPHEN 325 MG PO TABS: 650.0000 mg | ORAL_TABLET | ORAL | Status: DC | PRN

## 2014-08-22 MED ORDER — CEFAZOLIN SODIUM-DEXTROSE 2-3 GM-% IV SOLR
2.0000 g | INTRAVENOUS | Status: AC
  Administered 2014-08-22: 2 g via INTRAVENOUS

## 2014-08-22 MED ORDER — BACITRACIN-NEOMYCIN-POLYMYXIN 400-5-5000 EX OINT: 1.0000 "application " | TOPICAL_OINTMENT | Freq: Three times a day (TID) | CUTANEOUS | Status: DC | PRN

## 2014-08-22 MED ORDER — HYOSCYAMINE SULFATE 0.125 MG SL SUBL: 0.1250 mg | SUBLINGUAL_TABLET | SUBLINGUAL | Status: DC | PRN

## 2014-08-22 MED ORDER — VITAMIN D3 25 MCG (1000 UNIT) PO TABS
5000.0000 [IU] | ORAL_TABLET | Freq: Every day | ORAL | Status: DC
  Administered 2014-08-22 – 2014-08-23 (×2): 5000 [IU] via ORAL
  Filled 2014-08-22 (×2): qty 5

## 2014-08-22 MED ORDER — EPINEPHRINE HCL 1 MG/ML IJ SOLN
INTRAMUSCULAR | Status: AC
  Filled 2014-08-22: qty 1

## 2014-08-22 MED ORDER — PROPOFOL 10 MG/ML IV BOLUS
INTRAVENOUS | Status: DC | PRN
  Administered 2014-08-22: 100 mg via INTRAVENOUS

## 2014-08-22 MED ORDER — DIPHENHYDRAMINE HCL 50 MG/ML IJ SOLN: 12.5000 mg | Freq: Four times a day (QID) | INTRAMUSCULAR | Status: DC | PRN

## 2014-08-22 MED ORDER — SODIUM CHLORIDE 0.9 % IJ SOLN
INTRAMUSCULAR | Status: AC
  Filled 2014-08-22: qty 20

## 2014-08-22 MED ORDER — HYOSCYAMINE SULFATE 0.125 MG SL SUBL
0.1250 mg | SUBLINGUAL_TABLET | SUBLINGUAL | Status: DC | PRN
  Filled 2014-08-22: qty 1

## 2014-08-22 MED ORDER — MIRABEGRON ER 25 MG PO TB24
25.0000 mg | ORAL_TABLET | Freq: Every day | ORAL | Status: DC
  Administered 2014-08-23: 25 mg via ORAL
  Filled 2014-08-22: qty 1

## 2014-08-22 MED ORDER — PRAVASTATIN SODIUM 40 MG PO TABS
40.0000 mg | ORAL_TABLET | Freq: Every day | ORAL | Status: DC
  Administered 2014-08-22: 40 mg via ORAL
  Filled 2014-08-22 (×2): qty 1

## 2014-08-22 MED ORDER — FENTANYL CITRATE 0.05 MG/ML IJ SOLN
INTRAMUSCULAR | Status: DC | PRN
  Administered 2014-08-22: 50 ug via INTRAVENOUS

## 2014-08-22 MED ORDER — ETOMIDATE 2 MG/ML IV SOLN
INTRAVENOUS | Status: DC | PRN
  Administered 2014-08-22: 10 mg via INTRAVENOUS

## 2014-08-22 MED ORDER — CIPROFLOXACIN HCL 500 MG PO TABS
500.0000 mg | ORAL_TABLET | Freq: Two times a day (BID) | ORAL | Status: DC
  Administered 2014-08-23: 500 mg via ORAL
  Filled 2014-08-22: qty 1

## 2014-08-22 MED ORDER — SACCHAROMYCES BOULARDII 250 MG PO CAPS
250.0000 mg | ORAL_CAPSULE | Freq: Two times a day (BID) | ORAL | Status: DC
  Administered 2014-08-22 – 2014-08-23 (×2): 250 mg via ORAL
  Filled 2014-08-22 (×3): qty 1

## 2014-08-22 MED ORDER — INSULIN DETEMIR 100 UNIT/ML ~~LOC~~ SOLN
48.0000 [IU] | Freq: Every morning | SUBCUTANEOUS | Status: DC
  Administered 2014-08-23: 48 [IU] via SUBCUTANEOUS
  Filled 2014-08-22: qty 0.48

## 2014-08-22 MED ORDER — HYDRALAZINE HCL 25 MG PO TABS
25.0000 mg | ORAL_TABLET | Freq: Four times a day (QID) | ORAL | Status: DC
  Administered 2014-08-23 (×2): 25 mg via ORAL
  Filled 2014-08-22 (×4): qty 1

## 2014-08-22 MED ORDER — PROPOFOL 10 MG/ML IV BOLUS
INTRAVENOUS | Status: AC
  Filled 2014-08-22: qty 20

## 2014-08-22 MED ORDER — BRINZOLAMIDE-BRIMONIDINE 1-0.2 % OP SUSP
1.0000 [drp] | Freq: Three times a day (TID) | OPHTHALMIC | Status: DC
  Administered 2014-08-22 – 2014-08-23 (×3): 1 [drp] via OPHTHALMIC

## 2014-08-22 MED ORDER — ONDANSETRON HCL 4 MG/2ML IJ SOLN
INTRAMUSCULAR | Status: AC
  Filled 2014-08-22: qty 2

## 2014-08-22 MED ORDER — LOSARTAN POTASSIUM 50 MG PO TABS
50.0000 mg | ORAL_TABLET | Freq: Every day | ORAL | Status: DC
  Administered 2014-08-22: 50 mg via ORAL
  Filled 2014-08-22: qty 1

## 2014-08-22 MED ORDER — LATANOPROST 0.005 % OP SOLN
1.0000 [drp] | Freq: Every day | OPHTHALMIC | Status: DC
  Administered 2014-08-22: 1 [drp] via OPHTHALMIC
  Filled 2014-08-22: qty 2.5

## 2014-08-22 MED ORDER — GLUCOSE BLOOD VI STRP: 1.0000 | ORAL_STRIP | Status: DC | PRN

## 2014-08-22 MED ORDER — DOCUSATE SODIUM 100 MG PO CAPS
100.0000 mg | ORAL_CAPSULE | Freq: Two times a day (BID) | ORAL | Status: DC
  Administered 2014-08-23: 100 mg via ORAL
  Filled 2014-08-22 (×3): qty 1

## 2014-08-22 MED ORDER — DILTIAZEM HCL ER COATED BEADS 240 MG PO CP24
240.0000 mg | ORAL_CAPSULE | Freq: Every day | ORAL | Status: DC
  Administered 2014-08-22 – 2014-08-23 (×2): 240 mg via ORAL
  Filled 2014-08-22 (×2): qty 1

## 2014-08-22 MED ORDER — ONDANSETRON HCL 4 MG/2ML IJ SOLN: 4.0000 mg | INTRAMUSCULAR | Status: DC | PRN

## 2014-08-22 MED ORDER — HYDRALAZINE HCL 25 MG PO TABS
25.0000 mg | ORAL_TABLET | Freq: Three times a day (TID) | ORAL | Status: DC
  Administered 2014-08-22: 25 mg via ORAL
  Filled 2014-08-22: qty 1

## 2014-08-22 MED ORDER — LOSARTAN POTASSIUM 25 MG PO TABS
25.0000 mg | ORAL_TABLET | Freq: Every day | ORAL | Status: DC
  Administered 2014-08-23: 25 mg via ORAL
  Filled 2014-08-22: qty 1

## 2014-08-22 MED ORDER — LACTATED RINGERS IV SOLN
INTRAVENOUS | Status: DC
  Administered 2014-08-22: 09:00:00 via INTRAVENOUS

## 2014-08-22 MED ORDER — CARVEDILOL 12.5 MG PO TABS
12.5000 mg | ORAL_TABLET | Freq: Two times a day (BID) | ORAL | Status: DC
  Administered 2014-08-23: 12.5 mg via ORAL
  Filled 2014-08-22: qty 1

## 2014-08-22 MED ORDER — CEFAZOLIN SODIUM-DEXTROSE 2-3 GM-% IV SOLR
INTRAVENOUS | Status: AC
  Filled 2014-08-22: qty 50

## 2014-08-22 MED ORDER — BELLADONNA ALKALOIDS-OPIUM 16.2-60 MG RE SUPP
RECTAL | Status: AC
  Filled 2014-08-22: qty 1

## 2014-08-22 MED ORDER — SODIUM CHLORIDE 0.45 % IV SOLN: INTRAVENOUS | Status: DC

## 2014-08-22 MED ORDER — PROBIOTIC DAILY PO CAPS: 1.0000 | ORAL_CAPSULE | Freq: Every day | ORAL | Status: DC

## 2014-08-22 MED ORDER — FENTANYL CITRATE 0.05 MG/ML IJ SOLN
INTRAMUSCULAR | Status: AC
  Filled 2014-08-22: qty 2

## 2014-08-22 MED ORDER — SODIUM CHLORIDE 0.9 % IR SOLN
Status: DC | PRN
  Administered 2014-08-22: 15000 mL

## 2014-08-22 MED ORDER — INSULIN ASPART 100 UNIT/ML ~~LOC~~ SOLN
0.0000 [IU] | Freq: Three times a day (TID) | SUBCUTANEOUS | Status: DC
  Administered 2014-08-23: 2 [IU] via SUBCUTANEOUS

## 2014-08-22 MED ORDER — ETOMIDATE 2 MG/ML IV SOLN
INTRAVENOUS | Status: AC
  Filled 2014-08-22: qty 10

## 2014-08-22 MED ORDER — SODIUM CHLORIDE 0.45 % IV SOLN
INTRAVENOUS | Status: DC
  Administered 2014-08-22 – 2014-08-23 (×2): via INTRAVENOUS

## 2014-08-22 MED ORDER — HYDROMORPHONE HCL 1 MG/ML IJ SOLN: 0.2500 mg | INTRAMUSCULAR | Status: DC | PRN

## 2014-08-22 MED ORDER — FUROSEMIDE 20 MG PO TABS
20.0000 mg | ORAL_TABLET | Freq: Two times a day (BID) | ORAL | Status: DC
  Administered 2014-08-22 – 2014-08-23 (×2): 20 mg via ORAL
  Filled 2014-08-22 (×2): qty 1

## 2014-08-22 MED ORDER — INSULIN PEN NEEDLE 31G X 5 MM MISC: 48.0000 [IU] | Freq: Every morning | Status: DC

## 2014-08-22 MED ORDER — ADULT MULTIVITAMIN W/MINERALS CH
1.0000 | ORAL_TABLET | Freq: Every day | ORAL | Status: DC
  Administered 2014-08-22: 1 via ORAL
  Filled 2014-08-22 (×2): qty 1

## 2014-08-22 MED ORDER — PHENAZOPYRIDINE HCL 200 MG PO TABS: 200.0000 mg | ORAL_TABLET | Freq: Three times a day (TID) | ORAL | Status: DC | PRN

## 2014-08-22 MED ORDER — DIPHENHYDRAMINE HCL 12.5 MG/5ML PO ELIX: 12.5000 mg | ORAL_SOLUTION | Freq: Four times a day (QID) | ORAL | Status: DC | PRN

## 2014-08-22 MED ORDER — CIPROFLOXACIN HCL 500 MG PO TABS: 500.0000 mg | ORAL_TABLET | Freq: Two times a day (BID) | ORAL | Status: DC

## 2014-08-22 MED ORDER — SENNOSIDES-DOCUSATE SODIUM 8.6-50 MG PO TABS
2.0000 | ORAL_TABLET | Freq: Every day | ORAL | Status: DC
  Filled 2014-08-22: qty 2

## 2014-08-22 SURGICAL SUPPLY — 21 items
BAG URINE DRAINAGE (UROLOGICAL SUPPLIES) ×2 IMPLANT
BAG URO CATCHER STRL LF (DRAPE) ×3 IMPLANT
CATH AINSWORTH 30CC 24FR (CATHETERS) IMPLANT
CATH FOLEY 3WAY 30CC 24FR (CATHETERS)
CATH HEMA 3WAY 30CC 22FR COUDE (CATHETERS) ×2 IMPLANT
CATH URO 16X24FR 3W FL PS (CATHETERS) IMPLANT
CLOTH BEACON ORANGE TIMEOUT ST (SAFETY) ×3 IMPLANT
DRAPE CAMERA CLOSED 9X96 (DRAPES) ×1 IMPLANT
GLOVE BIOGEL M STRL SZ7.5 (GLOVE) ×3 IMPLANT
GOWN STRL REUS W/TWL LRG LVL3 (GOWN DISPOSABLE) ×3 IMPLANT
GOWN STRL REUS W/TWL XL LVL3 (GOWN DISPOSABLE) ×3 IMPLANT
HOLDER FOLEY CATH W/STRAP (MISCELLANEOUS) ×2 IMPLANT
KIT ASPIRATION TUBING (SET/KITS/TRAYS/PACK) ×1 IMPLANT
LOOP CUT BIPOLAR 24F LRG (ELECTROSURGICAL) ×2 IMPLANT
MANIFOLD NEPTUNE II (INSTRUMENTS) ×3 IMPLANT
NS IRRIG 1000ML POUR BTL (IV SOLUTION) ×1 IMPLANT
PACK CYSTO (CUSTOM PROCEDURE TRAY) ×3 IMPLANT
SYR 30ML LL (SYRINGE) ×2 IMPLANT
SYRINGE IRR TOOMEY STRL 70CC (SYRINGE) ×3 IMPLANT
TUBING CONNECTING 10 (TUBING) ×2 IMPLANT
TUBING CONNECTING 10' (TUBING) ×1

## 2014-08-22 NOTE — Progress Notes (Signed)
Urology Progress Note  Day of Surgery  Post op ck: ureine pink. No clots. Pt  Awake and alert.  Subjective:     No acute urologic events overnight. Ambulation:   positive Flatus:    negative Bowel movement  negative  Pain: complete resolution  Objective:  Blood pressure 126/43, pulse 49, temperature 97.5 F (36.4 C), temperature source Oral, resp. rate 15, height 5\' 10"  (1.778 m), weight 92.987 kg (205 lb), SpO2 100 %.  Physical Exam:  General:  No acute distress, awake Extremities: extremities normal, atraumatic, no cyanosis or edema Genitourinary:  Normal testes and penis Foley: traction, CBI       No results for input(s): HGB, WBC, PLT in the last 72 hours.  No results for input(s): NA, K, CL, CO2, BUN, CREATININE, CALCIUM, GFRNONAA, GFRAA in the last 72 hours.  Invalid input(s): MAGNESIUM   No results for input(s): INR, APTT in the last 72 hours.  Invalid input(s): PT   Invalid input(s): ABG  Assessment/Plan:  Catheter not removed. Continue any current medications. Follow up in 3 days for foley removal. .

## 2014-08-22 NOTE — Transfer of Care (Signed)
Immediate Anesthesia Transfer of Care Note  Patient: Gregory Hatfield  Procedure(s) Performed: Procedure(s): TRANSURETHRAL RESECTION OF THE PROSTATE (TURP) (N/A)  Patient Location: PACU  Anesthesia Type:General  Level of Consciousness: awake, sedated and patient cooperative  Airway & Oxygen Therapy: Patient Spontanous Breathing and Patient connected to face mask oxygen  Post-op Assessment: Report given to RN and Post -op Vital signs reviewed and stable  Post vital signs: Reviewed and stable  Last Vitals:  Filed Vitals:   08/22/14 0731  BP: 130/66  Pulse: 55  Temp: 36.7 C  Resp: 20    Complications: No apparent anesthesia complications

## 2014-08-22 NOTE — Interval H&P Note (Signed)
History and Physical Interval Note:  08/22/2014 9:48 AM  Gregory Hatfield  has presented today for surgery, with the diagnosis of BPH  The various methods of treatment have been discussed with the patient and family. After consideration of risks, benefits and other options for treatment, the patient has consented to  Procedure(s): TRANSURETHRAL RESECTION OF THE PROSTATE (TURP) (N/A) as a surgical intervention .  The patient's history has been reviewed, patient examined, no change in status, stable for surgery.  I have reviewed the patient's chart and labs.  Questions were answered to the patient's satisfaction.     Carolan Clines I

## 2014-08-22 NOTE — H&P (Signed)
Reason For Visit Overdue f/u   Active Problems Problems  1. Erectile dysfunction due to arterial insufficiency (N52.01)   Assessed By: Carolan Clines (Urology); Last Assessed: 11 Dec 2009 2. Hypogonadism, testicular (E29.1) 3. Increased urinary frequency (R35.0) 4. Nocturia (R35.1) 5. Prostate cancer (C61) 6. Urge incontinence of urine (N39.41)   Assessed By: Carolan Clines (Urology); Last Assessed: 22 Jul 2014  History of Present Illness     [name] returns today for an overdue f/u. Hx of LUTS, hypogonadism & ED. He is s/p PTNS tx #32 on 07/12/12. He is currently taking Myrbetriq 25mg . He states that his urge incontinence has improved about 50%. He has failed Oxybutynin, Enablex, and Uroxatral in the past. S/p PTNS #53 (last on 12/26/13). He is 4 yrs post HoLep, and had no cancer on tissue from median lobe sent to pathology.     07/15/11 PSA - 1.06   12/03/09 PSA - 2.43 (Dr. Melford Aase)    Is status post [facility] TURP 11/01/10. He has failed po medications which include: Oxytrol patch (rash), Oxybutynin 5 mg 1 po BID, and Enablex 15 mg 1 po daily. States no improvement of voiding sxs with procedure. At times will leak urine w/o sensation and has frequency Q1hrs.      Urodynamics in 09/14/10 was accomplished in the sitting position. The patient has a maximum bladder capacity of 106 cc, with an unstable bladder. The patient leaks the entire bladder volume. Leak point pressures accomplished, and he did not leak with abdominal pressure generation of 160 cm water. Pressure flow study shows a maximum flow rate of 6 cc per second, with a detrusor pressure at maximum flow of 28 and regular. PVR is 89 cc.      VCUG is accomplished, and shows a closed bladder neck.      This patient appears to have a small capacity bladder, which is unstable. He does voided involuntarily. He is able to generate a voluntary detrusor contraction, however. His flow pattern  appears to be obstructed, and he is not empty. He appears to have trabeculation, small diverticuli. His bladder base is elevated.      This is consistent with outlet obstruction.   Past Medical History Problems  1. History of Dry Cough 2. History of constipation (Z87.19) 3. History of diabetes mellitus (Z86.39) 4. History of esophageal reflux (Z87.19) 5. History of glaucoma (Z86.69) 6. History of hypertension (Z86.79) 7. History of Intestinal Stenosis 8. History of Ischemic Stroke  Surgical History Problems  1. History of Back Surgery 2. History of Cataract Surgery 3. History of Elective Circumcision 4. History of Eye Surgery 5. History of Laser Vaporization With Transurethral Resection Of Prostate 6. History of Neck Excision Of Soft Tissue Tumor 7. History of Neuroplasty Decompression Median Nerve At Carpal Tunnel 8. History of Neuroplasty Decompression Median Nerve At Carpal Tunnel 9. History of Radical Resection Of Soft Tissue Tumor Of Head 10. History of Total Hip Replacement  Current Meds 1. Alphagan P 0.1 % Ophthalmic Solution;  Therapy: (Recorded:11Feb2014) to Recorded 2. Aspirin 81 MG Oral Tablet;  Therapy: (Recorded:28Nov2007) to Recorded 3. BD Pen Needle Mini U/F 31G X 5 MM Miscellaneous;  Therapy: (Recorded:11Feb2014) to Recorded 4. Carvedilol 12.5 MG Oral Tablet;  Therapy: [id number] to Recorded 5. Diltiazem CD 240 MG Oral Capsule Extended Release 24 Hour;  Therapy: (Recorded:14Feb2012) to Recorded 6. Dorzolamide HCl - 2 % Ophthalmic Solution;  Therapy: (Recorded:14Aug2012) to Recorded 7. Econazole Nitrate  1 % External Cream;  Therapy: (Recorded:11Feb2014) to Recorded 8. Furosemide 40 MG Oral Tablet;  Therapy: [id number] to Recorded 9. HydrALAZINE HCl - 25 MG Oral Tablet;  Therapy: [id number] to Recorded 10. Latanoprost 0.005 % Ophthalmic Solution;   Therapy: (Recorded:14Aug2012) to Recorded 11. Levemir FlexPen 100 UNIT/ML SOLN;   Therapy: [id number] to  Recorded 12. Lisinopril 20 MG Oral Tablet;   Therapy: (Recorded:14Feb2012) to Recorded 13. Lumigan SOLN;   Therapy: ([id number]) to Recorded 14. Magnesium Gluconate 500 MG Oral Tablet;   Therapy: (Recorded:11Feb2014) to Recorded 15. Metamucil POWD;   Therapy: (Recorded:14Feb2012) to Recorded 16. Multiple Vitamin TABS;   Therapy: (Recorded:10Sep2008) to Recorded 17. Myrbetriq 25 MG Oral Tablet Extended Release 24 Hour; TAKE 1 TABLET   ONCE DAILY;   Therapy: [phone] to (Last Rx:17Dec2015)  Requested for: 21Dec2015   Ordered 18. OneTouch Ultra Blue STRP;   Therapy: (Recorded:11Feb2014) to Recorded 19. Pravastatin Sodium 40 MG Oral Tablet;   Therapy: (Recorded:14Feb2012) to Recorded 20. Tylenol Arthritis Pain TBCR;   Therapy: (Recorded:09Feb2009) to Recorded 21. Vitamin D3 CAPS;   Therapy: (Recorded:11Feb2014) to Recorded  Allergies Medication  1. Demerol TABS 2. Flomax CP24 3. Morphine Derivatives 4. Oxytrol PTTW  Family History Problems  1. Family history of Diabetes Mellitus 2. Family history of Family Health Status Number Of Children   1 son4 daughters 3. Family history of Prostate Cancer : Father 4. Family history of Tuberculosis : Mother 53. Family history of Urologic Disorder : Sister   Kidney stones  Social History Problems  1. Denied: History of Alcohol Use 2. Denied: History of Caffeine Use 3. Former smoker [phone])   smoked 1/10 ppd for 60 yrs & quit in 1985 4. Marital History - Single 5. Retired From Work  Review of [employer], constitutional, skin, eye, otolaryngeal, hematologic/lymphatic, cardiovascular, pulmonary, endocrine, musculoskeletal, gastrointestinal, neurological and psychiatric system(s) were reviewed and pertinent findings if present are noted and are otherwise negative.  Genitourinary: urinary frequency, feelings of urinary urgency, nocturia, incontinence, weak urinary stream, urinary stream starts and stops and  incomplete emptying of bladder.    Vitals Vital Signs [Data Includes: Last 1 Day]  Recorded: [id number] 03:38PM  Blood Pressure: 146 / 57 Temperature: 97.9 F Heart Rate: 56  Physical Exam Constitutional: Well nourished and well developed . No acute distress.  ENT:. The ears and nose are normal in appearance.  Neck: The appearance of the neck is normal and no neck mass is present.  Pulmonary: No respiratory distress and normal respiratory rhythm and effort.  Cardiovascular:. No peripheral edema.  Abdomen: The abdomen is soft and nontender. No masses are palpated. No CVA tenderness. No hernias are palpable. No hepatosplenomegaly noted.  Rectal: Rectal exam demonstrates decreased sphincter tone, no tenderness and no masses. Estimated prostate size is 2+. The prostate has no nodularity, is not indurated and is not tender. The left seminal vesicle is nonpalpable. The right seminal vesicle is nonpalpable. The perineum is normal on inspection.  Genitourinary: Examination of the penis demonstrates no discharge, no masses, no lesions and a normal meatus. The penis is uncircumcised. The scrotum is without lesions. Examination of the right scrotum demonstrates no mass. Examination of the left scrotum demostrates no mass. The right vas deferens is is palpably normal. The left vas deferens is palpably normal. The right epididymis is palpably normal and non-tender. The left epididymis is palpably normal and non-tender. The right testis is palpably normal, non-tender and without masses. The left testis is normal, non-tender and without masses.  Lymphatics: The  femoral and inguinal nodes are not enlarged or tender.  Skin: Normal skin turgor, no visible rash and no visible skin lesions.  Neuro/Psych:. Mood and affect are appropriate.    Results/Data  22 Jul 2014 2:47 PM  UA With REFLEX    COLOR YELLOW     APPEARANCE CLEAR     SPECIFIC GRAVITY 1.025     pH 5.5     GLUCOSE NEG     BILIRUBIN NEG     KETONE  NEG     BLOOD NEG     PROTEIN NEG     UROBILINOGEN 0.2     NITRITE NEG     LEUKOCYTE ESTERASE NEG  Assessment Assessed  1. Prostate cancer (C61) 2. Urge incontinence of urine (N39.41) 3. Benign prostatic hyperplasia with urinary obstruction (N40.1) 4. Elevated prostate specific antigen (PSA) (R97.2) 5. Nocturia (R35.1)  BPH, with increasing outlet obstruction. Urodynamics show high pressure and low flow.   Plan TURP.   Discussion/Summary cc: Dr. Melford Aase     Signatures Electronically signed by : Carolan Clines, M.D.; Jul 22 2014  4:31PM EST

## 2014-08-22 NOTE — Anesthesia Procedure Notes (Signed)
Procedure Name: LMA Insertion Date/Time: 08/22/2014 9:56 AM Performed by: Johnathan Hausen A Pre-anesthesia Checklist: Patient identified, Emergency Drugs available, Suction available, Patient being monitored and Timeout performed Patient Re-evaluated:Patient Re-evaluated prior to inductionOxygen Delivery Method: Circle system utilized Preoxygenation: Pre-oxygenation with 100% oxygen Intubation Type: Combination inhalational/ intravenous induction Ventilation: Mask ventilation without difficulty LMA: LMA with gastric port inserted LMA Size: 5.0 Number of attempts: 1 Placement Confirmation: positive ETCO2 and breath sounds checked- equal and bilateral Tube secured with: Tape Dental Injury: Teeth and Oropharynx as per pre-operative assessment

## 2014-08-22 NOTE — Op Note (Signed)
Pre-operative diagnosis :   Prostate cancer with BPH and urodynamic bladder out obstruction  Postoperative diagnosis:  Same  Operation:  Cystourethroscopy, TURP  Surgeon:  S. Gaynelle Arabian, MD  First assistant: None    Anesthesia:  General LMA   Preparation:  After appropriate preanesthesia, the patient was brought the operative room, placed on the operating table in the dorsal supine position where general LMA anesthesia was introduced. He was then replaced in the dorsal lithotomy position with the pubis was prepped with Betadine solution and draped in usual fashion. The arm and was double checked. The history was double checked.   Review history:  Erectile dysfunction due to arterial insufficiency (N52.01)  Assessed By: Carolan Clines (Urology); Last Assessed: 11 Dec 2009 2. Hypogonadism, testicular (E29.1) 3. Increased urinary frequency (R35.0) 4. Nocturia (R35.1) 5. Prostate cancer (C61) 6. Urge incontinence of urine (N39.41)  Assessed By: Carolan Clines (Urology); Last Assessed: 22 Jul 2014  History of Present Illness    [name] returns today for an overdue f/u. Hx of LUTS, hypogonadism & ED. He is s/p PTNS tx #32 on 07/12/12. He is currently taking Myrbetriq 25mg . He states that his urge incontinence has improved about 50%. He has failed Oxybutynin, Enablex, and Uroxatral in the past. S/p PTNS #53 (last on 12/26/13). He is 4 yrs post HoLep, and had no cancer on tissue from median lobe sent to pathology.    Statement of  Likelihood of Success: Excellent. TIME-OUT observed.:  Procedure:  Cystourethroscopy was accomplished using the [facility] instruments. No urethral stricture was identified in the proximal or distal urethra. The 0 was identified and was left in place during the procedure. By lobar prostate enlargement was noted. The bladder neck was previously operated upon, and scar was noted completely at the right side of the bladder neck. The bladder  itself was evaluated and showed trabeculation, but no several formation. There was no evidence of bladder diverticular formation. It was no bladder stone or tumor present. There was clear eflux from both ureteral orifices, located in normal area of the trigone.  Resection was begun at the 7:00 this in, and carried to the 5:00 position. Resection was then accomplished from the 11:00 to the 7:00 position, and from the 2:00 to the 5:00 positions. Extensive cauterization was accomplished to avoid postoperative bleeding. Chips were evacuated free from the bladder, and a size 22 [place] hematuria catheter was placed with 30 mL of sterile water in the balloon. Mild traction, and continuous irrigation were started, and the patient was awakened, and taken to recovery room in good condition. A second timeout was a [place], and chips were sent to the pathology laboratory. The patient was awakened and taken to recovery room in good condition. B and O suppository was not given because of a history of morphine allergy.

## 2014-08-22 NOTE — Discharge Instructions (Signed)
Post transurethral resection of the prostate (TURP) instructions ° °Your recent prostate surgery requires very special post hospital care. Despite the fact that no skin incisions were used the area around the prostate incision is quite raw and is covered with a scab to promote healing and prevent bleeding. Certain cautions are needed to assure that the scab is not disturbed of the next 2-3 weeks while the healing proceeds. ° °Because the raw surface in your prostate and the irritating effects of urine you may expect frequency of urination and/or urgency (a stronger desire to urinate) and perhaps even getting up at night more often. This will usually resolve or improve slowly over the healing period. You may see some blood in your urine over the first 6 weeks. Do not be alarmed, even if the urine was clear for a while. Get off your feet and drink lots of fluids until clearing occurs. If you start to pass clots or don't improve call us. ° °Diet: ° °You may return to your normal diet immediately. Because of the raw surface of your bladder, alcohol, spicy foods, foods high in acid and drinks with caffeine may cause irritation or frequency and should be used in moderation. To keep your urine flowing freely and avoid constipation, drink plenty of fluids during the day (8-10 glasses). Tip: Avoid cranberry juice because it is very acidic. ° °Activity: ° °Your physical activity doesn't need to be restricted. However, if you are very active, you may see some blood in the urine. We suggest that you reduce your activity under the circumstances until the bleeding has stopped. ° °Bowels: ° °It is important to keep your bowels regular during the postoperative period. Straining with bowel movements can cause bleeding. A bowel movement every other day is reasonable. Use a mild laxative if needed, such as milk of magnesia 2-3 tablespoons, or 2 Dulcolax tablets. Call if you continue to have problems. If you had been taking narcotics  for pain, before, during or after your surgery, you may be constipated. Take a laxative if necessary. ° °Medication: ° °You should resume your pre-surgery medications unless told not to. In addition you may be given an antibiotic to prevent or treat infection. Antibiotics are not always necessary. All medication should be taken as prescribed until the bottles are finished unless you are having an unusual reaction to one of the drugs. ° ° ° ° °Problems you should report to us: ° °a. Fever greater than 101°F. °b. Heavy bleeding, or clots (see notes above about blood in urine). °c. Inability to urinate. °d. Drug reactions (hives, rash, nausea, vomiting, diarrhea). °e. Severe burning or pain with urination that is not improving. ° °

## 2014-08-22 NOTE — Anesthesia Preprocedure Evaluation (Addendum)
Anesthesia Evaluation  Patient identified by MRN, date of birth, ID band Patient awake  General Assessment Comment:1. History of Dry Cough 2. History of constipation (Z87.19) 3. History of diabetes mellitus (Z86.39) 4. History of esophageal reflux (Z87.19) 5. History of glaucoma (Z86.69) 6. History of hypertension (Z86.79) 7. History of Intestinal Stenosis 8. History of Ischemic Stroke  Reviewed: Allergy & Precautions, NPO status , Patient's Chart, lab work & pertinent test results  Airway Mallampati: II  TM Distance: >3 FB Neck ROM: Full    Dental  (+) Poor Dentition, Dental Advisory Given, Missing,    Pulmonary former smoker,  breath sounds clear to auscultation  Pulmonary exam normal       Cardiovascular hypertension, Pt. on medications and Pt. 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  Neuromuscular disease CVA, Residual Symptoms    GI/Hepatic Neg liver ROS, GERD-  ,  Endo/Other  diabetes  Renal/GU Renal disease  negative genitourinary   Musculoskeletal  (+) Arthritis -,   Abdominal   Peds negative pediatric ROS (+)  Hematology  (+) anemia ,   Anesthesia Other Findings   Reproductive/Obstetrics negative OB ROS                            Anesthesia Physical Anesthesia Plan  ASA: III  Anesthesia Plan: General   Post-op Pain Management:    Induction: Intravenous  Airway Management Planned: LMA  Additional Equipment:   Intra-op Plan:   Post-operative Plan: Extubation in OR  Informed Consent: I have reviewed the patients History and Physical, chart, labs and discussed the procedure including the risks, benefits and alternatives for the proposed anesthesia with the patient or authorized representative who has indicated his/her understanding and acceptance.   Dental advisory given  Plan  Discussed with: CRNA  Anesthesia Plan Comments:         Anesthesia Quick Evaluation

## 2014-08-22 NOTE — Anesthesia Postprocedure Evaluation (Signed)
  Anesthesia Post-op Note  Patient: Gregory Hatfield  Procedure(s) Performed: Procedure(s) (LRB): TRANSURETHRAL RESECTION OF THE PROSTATE (TURP) (N/A)  Patient Location: PACU  Anesthesia Type: General  Level of Consciousness: awake and alert   Airway and Oxygen Therapy: Patient Spontanous Breathing  Post-op Pain: mild  Post-op Assessment: Post-op Vital signs reviewed, Patient's Cardiovascular Status Stable, Respiratory Function Stable, Patent Airway and No signs of Nausea or vomiting  Last Vitals:  Filed Vitals:   08/22/14 1431  BP: 162/70  Pulse: 49  Temp: 36.4 C  Resp: 15    Post-op Vital Signs: stable   Complications: No apparent anesthesia complications

## 2014-08-23 DIAGNOSIS — N401 Enlarged prostate with lower urinary tract symptoms: Secondary | ICD-10-CM | POA: Diagnosis not present

## 2014-08-23 LAB — GLUCOSE, CAPILLARY: Glucose-Capillary: 174 mg/dL — ABNORMAL HIGH (ref 70–99)

## 2014-08-23 NOTE — Discharge Summary (Signed)
Physician Discharge Summary  Patient ID: Gregory Hatfield MRN: [id number] DOB/AGE: 12/02/1925 81 y.o.  Admit date: 08/22/2014 Discharge date: 08/23/2014  Admission Diagnoses: BPH  Discharge Diagnoses:  Active Problems:   Prostate cancer   Discharged Condition: good  Hospital Course: He was admitted for elective transurethral resection of his prostate due to obstruction. This proceeded without difficulty. He was observed overnight with Foley catheter in place and his urine has cleared. There are no clots. He is not having any suprapubic discomfort and is tolerating regular diet. He was felt ready for discharge at this time.  Significant Diagnostic Studies: No results found.  Discharge Exam: Blood pressure 138/62, pulse 48, temperature 97.7 F (36.5 C), temperature source Oral, resp. rate 16, height 5\' 10"  (1.778 m), weight 92.987 kg (205 lb), SpO2 100 %. His abdomen is soft and nontender without mass. His Foley catheter is indwelling and draining slightly pink urine with no clots.  Disposition: 03-Skilled Nursing Facility  Discharge Instructions    Discharge patient    Complete by:  As directed      Urinary leg bag    Complete by:  As directed   Give patient a leg bag on discharge.            Medication List    TAKE these medications        B-D UF III MINI PEN NEEDLES 31G X 5 MM Misc  Generic drug:  Insulin Pen Needle  USE FOR INJECTIONS AS DIRECTED     carvedilol 12.5 MG tablet  Commonly known as:  COREG  TAKE  (1)  TABLET TWICE A DAY FOR HIGH BLOOD PRESSURE.     ciprofloxacin 500 MG tablet  Commonly known as:  CIPRO  Take 1 tablet (500 mg total) by mouth 2 (two) times daily.     diltiazem 240 MG 24 hr capsule  Commonly known as:  CARDIZEM CD  Take 240 mg by mouth daily.     docusate sodium 100 MG capsule  Commonly known as:  COLACE  Take 1 capsule (100 mg total) by mouth 2 (two) times daily.     furosemide 40 MG tablet  Commonly known as:  LASIX  TAKE 1  TABLET TWICE DAILY FOR BLOOD PRESSURE AND FLUIDS.     hydrALAZINE 25 MG tablet  Commonly known as:  APRESOLINE  TAKE  (1)  TABLET  FOUR TIMES DAILY.     hydrALAZINE 25 MG tablet  Commonly known as:  APRESOLINE  TAKE  (1)  TABLET  FOUR TIMES DAILY.     hyoscyamine 0.125 MG SL tablet  Commonly known as:  LEVSIN/SL  Place 1 tablet (0.125 mg total) under the tongue every 4 (four) hours as needed.     insulin detemir 100 UNIT/ML injection  Commonly known as:  LEVEMIR  Inject 0.48 mLs (48 Units total) into the skin every morning.     losartan 50 MG tablet  Commonly known as:  COZAAR  Take 1 tablet (50 mg total) by mouth daily.     LUMIGAN 0.01 % Soln  Generic drug:  bimatoprost  Place 1 drop into the right eye at bedtime.     multivitamin tablet  Take 1 tablet by mouth daily.     MYRBETRIQ 25 MG Tb24 tablet  Generic drug:  mirabegron ER  Take 25 mg by mouth daily. For bladder     phenazopyridine 200 MG tablet  Commonly known as:  PYRIDIUM  Take 1 tablet (200 mg total) by  mouth 3 (three) times daily as needed for pain.     pravastatin 40 MG tablet  Commonly known as:  PRAVACHOL  TAKE 1 TABLET ONCE DAILY.     PROBIOTIC DAILY PO  Take 1 tablet by mouth daily.     SIMBRINZA 1-0.2 % Susp  Generic drug:  Brinzolamide-Brimonidine  Place 1 drop into both eyes 3 (three) times daily.     TRUETEST TEST test strip  Generic drug:  glucose blood  CHECK BLOOD SUGAR 6 TIMES A DAY.     Vitamin D3 5000 UNITS Caps  Take 1 capsule by mouth daily.           Follow-up Information    Follow up with Carolan Clines I, MD.   Specialty:  Urology   Why:  to schedule appointment next week.   Contact information:   [place] [address] [phone]       Signed: Claybon Jabs 08/23/2014, 7:59 AM

## 2014-08-23 NOTE — Progress Notes (Signed)
Catheter bag teaching with daughter at bedside. Patient and daughter were able to verbalize and demonstrate understanding of teaching. Will discharge at this time. Setzer, Marchelle Folks

## 2014-08-25 ENCOUNTER — Other Ambulatory Visit: Payer: Self-pay

## 2014-08-25 ENCOUNTER — Encounter (HOSPITAL_COMMUNITY): Payer: Self-pay | Admitting: Urology

## 2014-08-25 MED ORDER — HYDRALAZINE HCL 25 MG PO TABS: 25.0000 mg | ORAL_TABLET | Freq: Three times a day (TID) | ORAL | Status: DC

## 2014-09-04 ENCOUNTER — Other Ambulatory Visit: Payer: Self-pay | Admitting: Endocrinology

## 2014-09-05 ENCOUNTER — Telehealth: Payer: Self-pay

## 2014-09-05 NOTE — Telephone Encounter (Signed)
Received a fax from pt's pharmacy requesting a refill on his Levemir. Pt reported to them he is taking 50 units but last ov note says 78.  Please advise what dosage should be sent in. Thanks!

## 2014-09-05 NOTE — Telephone Encounter (Signed)
i think 50 is a little too much.  Please continue 48.  i'll see you next time.

## 2014-09-05 NOTE — Telephone Encounter (Signed)
please call patient: On 50/day, what is the range of your cbg's

## 2014-09-05 NOTE — Telephone Encounter (Signed)
Called and spoke with [facility] and they are aware that pt's Levemir should be 48 units.  Called and spoke with pt and pt states his insulin is running high and that is why he was taking 50 units.  Pt states he has 8 doctors and they all prescribe him medication and it interacts with each other.  Pt states he feels like his blood sugars were running too high. The reason pt was taking 50 units of Levemir was because that is what the hospital had him on.   Pt states when he blood sugars are in the 160's it affects his eyes.  Pt would like to know the range of what his blood sugars should be running. Pls advise.   Here are the blood sugars for this week:  (breakfast, lunch, dinner, and bedtime)  Mon 122 158 123 175 Tue 133 120 157 169 Wed 150 168 154  Thurs 105 168  fri 142

## 2014-09-08 NOTE — Telephone Encounter (Signed)
please call patient: Please continue the same insulin for now. i'll see you next time.

## 2014-09-08 NOTE — Telephone Encounter (Signed)
Left voicemail requesting a call back to discuss.

## 2014-09-08 NOTE — Telephone Encounter (Addendum)
I contacted pt. Readings are as follows. These readings are based upon the pt taking 50 units of Levemir.  09/01/2014:  Breakfast: 122 Lunch: 158 Supper: 123  Bedtime: 175   09/02/2014:  Breakfast: 133 Lunch: 120 Supper: 157 Bedtime: 169  09/03/2014: Breakfast: 150  Lunch: 168 Supper: 154   09/04/2014: Breakfast: 105 Lunch: 168  Supper: 58  Bedtime: 75  09/05/2014:  Breakfast: 142  Lunch: 158 Supper: 147  09/06/2014:  Breakfast: 203 Lunch: 184 Supper: 98

## 2014-09-09 ENCOUNTER — Other Ambulatory Visit: Payer: Self-pay

## 2014-09-09 MED ORDER — INSULIN DETEMIR 100 UNIT/ML FLEXPEN: PEN_INJECTOR | SUBCUTANEOUS | Status: DC

## 2014-09-09 NOTE — Telephone Encounter (Signed)
Left voicemail advising of note below. Requested call back if pt would like to discuss.

## 2014-09-16 ENCOUNTER — Encounter: Payer: Self-pay | Admitting: Internal Medicine

## 2014-09-16 ENCOUNTER — Ambulatory Visit (INDEPENDENT_AMBULATORY_CARE_PROVIDER_SITE_OTHER): Payer: Medicare PPO | Admitting: Internal Medicine

## 2014-09-16 VITALS — BP 134/58 | HR 64 | Temp 97.3°F | Resp 16 | Ht 70.5 in | Wt 212.4 lb

## 2014-09-16 DIAGNOSIS — E1029 Type 1 diabetes mellitus with other diabetic kidney complication: Secondary | ICD-10-CM

## 2014-09-16 DIAGNOSIS — R6889 Other general symptoms and signs: Secondary | ICD-10-CM

## 2014-09-16 DIAGNOSIS — Z9181 History of falling: Secondary | ICD-10-CM

## 2014-09-16 DIAGNOSIS — F0280 Dementia in other diseases classified elsewhere without behavioral disturbance: Secondary | ICD-10-CM

## 2014-09-16 DIAGNOSIS — E782 Mixed hyperlipidemia: Secondary | ICD-10-CM

## 2014-09-16 DIAGNOSIS — I150 Renovascular hypertension: Secondary | ICD-10-CM

## 2014-09-16 DIAGNOSIS — H4010X0 Unspecified open-angle glaucoma, stage unspecified: Secondary | ICD-10-CM

## 2014-09-16 DIAGNOSIS — Z79899 Other long term (current) drug therapy: Secondary | ICD-10-CM

## 2014-09-16 DIAGNOSIS — C61 Malignant neoplasm of prostate: Secondary | ICD-10-CM

## 2014-09-16 DIAGNOSIS — Z0001 Encounter for general adult medical examination with abnormal findings: Secondary | ICD-10-CM

## 2014-09-16 DIAGNOSIS — Z0000 Encounter for general adult medical examination without abnormal findings: Secondary | ICD-10-CM

## 2014-09-16 DIAGNOSIS — Z1331 Encounter for screening for depression: Secondary | ICD-10-CM

## 2014-09-16 DIAGNOSIS — E559 Vitamin D deficiency, unspecified: Secondary | ICD-10-CM

## 2014-09-16 DIAGNOSIS — G309 Alzheimer's disease, unspecified: Secondary | ICD-10-CM

## 2014-09-16 DIAGNOSIS — K219 Gastro-esophageal reflux disease without esophagitis: Secondary | ICD-10-CM

## 2014-09-16 LAB — CBC WITH DIFFERENTIAL/PLATELET
Basophils Absolute: 0.1 10*3/uL (ref 0.0–0.1)
Basophils Relative: 1 % (ref 0–1)
EOS PCT: 4 % (ref 0–5)
Eosinophils Absolute: 0.2 10*3/uL (ref 0.0–0.7)
HCT: 39.2 % (ref 39.0–52.0)
Hemoglobin: 12.9 g/dL — ABNORMAL LOW (ref 13.0–17.0)
LYMPHS ABS: 2.3 10*3/uL (ref 0.7–4.0)
LYMPHS PCT: 37 % (ref 12–46)
MCH: 29.1 pg (ref 26.0–34.0)
MCHC: 32.9 g/dL (ref 30.0–36.0)
MCV: 88.3 fL (ref 78.0–100.0)
MPV: 12.7 fL — AB (ref 8.6–12.4)
Monocytes Absolute: 0.6 10*3/uL (ref 0.1–1.0)
Monocytes Relative: 9 % (ref 3–12)
Neutro Abs: 3 10*3/uL (ref 1.7–7.7)
Neutrophils Relative %: 49 % (ref 43–77)
Platelets: 220 10*3/uL (ref 150–400)
RBC: 4.44 MIL/uL (ref 4.22–5.81)
RDW: 13.9 % (ref 11.5–15.5)
WBC: 6.2 10*3/uL (ref 4.0–10.5)

## 2014-09-16 NOTE — Patient Instructions (Signed)

## 2014-09-16 NOTE — Progress Notes (Signed)
Patient ID: Gregory Hatfield, male   DOB: 07-May-1926, 81 y.o.   MRN: [id number]  MEDICARE ANNUAL WELLNESS VISIT AND OV  Assessment:   1. Benign renovascular hypertension  - TSH  2. Hyperlipidemia  - Lipid panel  3. Vitamin D deficiency  - Vit D  25 hydroxy (rtn osteoporosis monitoring)  4. Alzheimer's disease   5. Gastroesophageal reflux disease   6. Prostate cancer   7. Open-angle glaucoma, stage unspecified   8. Depression screen   9. At low risk for fall   10. Medication management  - CBC with Differential/Platelet - BASIC METABOLIC PANEL WITH GFR - Hepatic function panel - Magnesium  11. Routine general medical examination at a health care facility   12. Poorly Controlled type 1 diabetes with renal manifestation   Plan:   During the course of the visit the patient was educated and counseled about appropriate screening and preventive services including:    Pneumococcal vaccine   Influenza vaccine  Td vaccine  Screening electrocardiogram  Bone densitometry screening  Colorectal cancer screening  Diabetes screening  Glaucoma screening  Nutrition counseling   Advanced directives: requested  Screening recommendations, referrals: Vaccinations: Immunization History  Administered Date(s) Administered  . DTaP 09/28/2006  . Influenza Whole 04/05/2010, 04/04/2013  . Influenza, High Dose Seasonal PF 04/02/2014  . Influenza-Unspecified 02/19/2011  . Pneumococcal Conjugate-13 06/10/2014  . Pneumococcal Polysaccharide-23 07/21/2001, 03/20/2008  . Td 08/28/2008  Shingles vaccine declined Hep B vaccine not indicated  Nutrition assessed and recommended  Colonoscopy 2010 Recommended yearly ophthalmology/optometry visit for glaucoma screening and checkup Recommended yearly dental visit for hygiene and checkup Advanced directives - yes  Conditions/risks identified: BMI: Discussed weight loss, diet, and increase physical activity.  Increase  physical activity: AHA recommends 150 minutes of physical activity a week.  Medications reviewed Diabetes is not at goal, ACE/ARB therapy: Yes. Urinary Incontinence is not an issue: discussed non pharmacology and pharmacology options.  Fall risk: low- discussed PT, home fall assessment, medications.   Subjective:    Gregory Hatfield  presents for [facility]ness Visit and OV.  Date of last medicare wellness visit was 09/10/2013. He has had elevated blood pressure since 1992. His blood pressure has been controlled at home, today his BP is 134/58 today.  He does not workout. He denies chest pain, shortness of breath, dizziness.  He is on cholesterol medication and denies myalgias. His cholesterol is at goal. The cholesterol last visit was:  Lab Results  Component Value Date   CHOL 135 06/10/2014   HDL 43 06/10/2014   LDLCALC 81 06/10/2014   TRIG 56 06/10/2014   CHOLHDL 3.1 06/10/2014    He has had diabetes for 43 years since 1973 - initially treated with oral agents til started on Insulin in 1997. Currently he's followed by Gregory Hatfield who has innovated a protocol of loose control given the patient's limited cognitive capacity and insight in comprehending a vigorous regimen.  He has been working on diet and exercise and denies foot ulcerations, hyperglycemia, hypoglycemia , nausea, paresthesia of the feet, polydipsia, polyuria and visual disturbances. Last A1C in the office was:  Lab Results  Component Value Date   HGBA1C 8.5* 08/06/2014   Patient is on Vitamin D supplement.   Lab Results  Component Value Date   VD25OH 61 06/10/2014      Patient has GERD which is controlled with diet and IBS for which he occasionally takes hyoscyamine. Other problems include Depression, controlled or in remission and  lastly hx/o of low grade Prostate Cancer monitored by Gregory Hatfield. Patient is functioning fairly independently at home , but is felt to9 have a degree of dementia.   Names of Other  Physician/Practitioners you currently use: 1. Gregory Hatfield here for primary care 2. Gregory Hatfield, eye doctor, last visit Jan 2016 for quarterly Glaucoma monitoring 3. Gregory's Loistine Hatfield, DDS, dentist, last visit - 1 week ago  Patient Care Team: Gregory Pinto, MD as PCP - General (Internal Hatfield) Gregory Shin, MD as Consulting Physician (Endocrinology) Gregory Hatfield (Optometry) Gregory Server, MD as Consulting Physician (Orthopedic Surgery) Gregory Clines, MD as Consulting Physician (Urology) Gregory Spates, MD as Consulting Physician (Gastroenterology) Gregory Contras, MD as Consulting Physician (Nephrology) Gregory Hitch, MD as Consulting Physician (Orthopedic Surgery) Gregory Casino, MD as Consulting Physician (Cardiology) Gregory Irani, MD as Consulting Physician (Gastroenterology)  Medication Review: Medication Sig  . carvedilol (COREG) 12.5 MG tablet TAKE  (1)  TABLET TWICE A DAY FOR HIGH BLOOD PRESSURE.  Marland Kitchen Cholecalciferol (VITAMIN D3) 5000 UNITS CAPS Take 1 capsule by mouth daily.   Marland Kitchen diltiazem (CARDIZEM CD) 240 MG 24 hr capsule Take 240 mg by mouth daily.   Marland Kitchen docusate sodium (COLACE) 100 MG capsule Take 1 capsule (100 mg total) by mouth 2 (two) times daily.  . furosemide (LASIX) 40 MG tablet TAKE 1 TABLET TWICE DAILY FOR BLOOD PRESSURE AND FLUIDS.  . hydrALAZINE (APRESOLINE) 25 MG tablet Take 1 tablet (25 mg total) by mouth 3 (three) times daily.  . hyoscyamine (LEVSIN/SL) 0.125 MG SL tablet Place 1 tablet (0.125 mg total) under the tongue every 4 (four) hours as needed.  . Insulin Detemir (LEVEMIR FLEXTOUCH) 100 UNIT/ML Pen Inject 50 units into the skin daily.  Marland Kitchen losartan (COZAAR) 50 MG tablet Take 1 tablet (50 mg total) by mouth daily. (Patient taking differently: Take 50 mg by mouth daily. Takes 1/2 pill daily = 25 mg)  . LUMIGAN 0.01 % SOLN Place 1 drop into the right eye at bedtime.   . Mirabegron ER (MYRBETRIQ) 25 MG  TB24 Take 25 mg by mouth daily. For bladder  . Multiple Vitamin (MULTIVITAMIN) tablet Take 1 tablet by mouth daily.    . pravastatin (PRAVACHOL) 40 MG tablet TAKE 1 TABLET ONCE DAILY.  . Probiotic Product (PROBIOTIC DAILY PO) Take 1 tablet by mouth daily.   Marland Kitchen SIMBRINZA 1-0.2 % SUSP Place 1 drop into both eyes 3 (three) times daily.   Current Problems (verified) Patient Active Problem List   Diagnosis Date Noted  . Prostate cancer 08/22/2014  . Anemia in chronic kidney disease(285.21) 02/04/2014  . Benign renovascular hypertension 01/24/2014  . Osteoarthritis of left hip 01/17/2014  . Status post left hip replacement 01/17/2014  . Medication management 10/23/2013  . Chronic renal insufficiency, stage III (moderate) 06/18/2013  . Vitamin D deficiency 06/16/2013  . Alzheimer's disease 06/16/2013  . T1_IDDM w/CKD3 (GFR 43 ml/min) 06/16/2013  . BPH (benign prostatic hyperplasia) 07/01/2011  . Hyperlipidemia 12/08/2009  . Open-angle glaucoma 12/08/2009  . OVERACTIVE BLADDER 12/08/2009  . GERD 01/10/2007   Screening Tests Health Maintenance  Topic Date Due  . ZOSTAVAX  11/09/1987  . URINE MICROALBUMIN  12/12/2014  . INFLUENZA VACCINE  01/19/2015  . HEMOGLOBIN A1C  02/04/2015  . FOOT EXAM  06/26/2015  . COLONOSCOPY  06/20/2018  . TETANUS/TDAP  08/29/2018  . PNA vac Low Risk Adult  Completed   Immunization History  Administered Date(s) Administered  . DTaP 09/28/2006  .  Influenza Whole 04/05/2010, 04/04/2013  . Influenza, High Dose Seasonal PF 04/02/2014  . Influenza-Unspecified 02/19/2011  . Pneumococcal Conjugate-13 06/10/2014  . Pneumococcal Polysaccharide-23 07/21/2001, 03/20/2008  . Td 08/28/2008    Preventative care: Last colonoscopy: 2010- recc 10 yr f/u   History reviewed: allergies, current medications, past family history, past medical history, past social history, past surgical history and problem list  Risk Factors: Tobacco History  Substance Use Topics  .  Smoking status: Former Smoker    Quit date: 06/21/1983  . Smokeless tobacco: Never Used  . Alcohol Use: No   He does not smoke.  Patient is a former smoker. Are there smokers in your home (other than you)?  No  Alcohol Current alcohol use: none  Caffeine Current caffeine use: denies use  Exercise Current exercise: bicycling and weightlifting  Nutrition/Diet Current diet: in general, an "unhealthy" diet  Cardiac risk factors: advanced age (older than 4 for men, 77 for women), diabetes mellitus, dyslipidemia, hypertension, male gender, microalbuminuria, obesity (BMI >= 30 kg/m2), sedentary lifestyle and smoking/ tobacco exposure.  Depression Screen (Note: if answer to either of the following is "Yes", a more complete depression screening is indicated)   Q1: Over the past two weeks, have you felt down, depressed or hopeless? No  Q2: Over the past two weeks, have you felt little interest or pleasure in doing things? No  Have you lost interest or pleasure in daily life? No  Do you often feel hopeless? No  Do you cry easily over simple problems? No  Activities of Daily Living In your present state of health, do you have any difficulty performing the following activities?:  Driving? No Managing money?  No Feeding yourself? No Getting from bed to chair? No Climbing a flight of stairs? No Preparing food and eating?: No Bathing or showering? No Getting dressed: No Getting to the toilet? No Using the toilet:No Moving around from place to place: No In the past year have you fallen or had a near fall?:No   Are you sexually active?  No  Do you have more than one partner?  No  Vision Difficulties: No  Hearing Difficulties: No Do you often ask people to speak up or repeat themselves? No Do you experience ringing or noises in your ears? No Do you have difficulty understanding soft or whispered voices? No  Cognition  Do you feel that you have a problem with memory?No  Do you  often misplace items? No  Do you feel safe at home?  Yes  Advanced directives Does patient have a [place]? Yes Does patient have a Living Will? Yes  Past Medical History  Diagnosis Date  . Diabetes mellitus     insulin-dependent  . CHF (congestive heart failure)   . GERD (gastroesophageal reflux disease)   . Hypertension   . Dyslipidemia   . Glaucoma   . CVA (cerebral infarction)     Left Eye  . Diabetic retinopathy   . CKD (chronic kidney disease)   . BPH (benign prostatic hyperplasia)   . History of nuclear stress test 03/2004    inf wall scar from base to apex & anterolateral wall ischemia  . Hyperlipidemia   . Vitamin D deficiency   . Bronchitis     hx of   . Arthritis     Past Surgical History  Procedure Laterality Date  . Cardiac catheterization  04/2004    normal r-sided pressure, mild pulm HTN  . Prostate surgery  10/2010    transurethral resection   . Transthoracic echocardiogram  05/2006    EF 60-70%; mild calcif of MV; mild MV regurg; LA mildly dilated  . Carpal tunnel release Right   . Lumbar spine surgery  2003  . Cervical spine surgery  1990's  . Total hip arthroplasty Left 01/17/2014    Procedure: LEFT TOTAL HIP ARTHROPLASTY;  Surgeon: Yvette Rack., MD;  Location: [facility];  Service: Orthopedics;  Laterality: Left;  . Transurethral resection of prostate N/A 08/22/2014    Procedure: TRANSURETHRAL RESECTION OF THE PROSTATE (TURP);  Surgeon: Marcia Brash, MD;  Location: [facility];  Service: Urology;  Laterality: N/A;    ROS: Constitutional: Denies fever, chills, weight loss/gain, headaches, insomnia, fatigue, night sweats or change in appetite. Eyes: Denies redness, blurred vision, diplopia, discharge, itchy or watery eyes.  ENT: Denies discharge, congestion, post nasal drip, epistaxis, sore throat, earache, hearing loss, dental pain, Tinnitus, Vertigo, Sinus pain or snoring.  Cardio: Denies chest pain, palpitations, irregular  heartbeat, syncope, dyspnea, diaphoresis, orthopnea, PND, claudication or edema Respiratory: denies cough, dyspnea, DOE, pleurisy, hoarseness, laryngitis or wheezing.  Gastrointestinal: Denies dysphagia, heartburn, reflux, water brash, pain, cramps, nausea, vomiting, bloating, diarrhea, constipation, hematemesis, melena, hematochezia, jaundice or hemorrhoids Genitourinary: Denies dysuria, frequency, urgency, nocturia, hesitancy, discharge, hematuria or flank pain Musculoskeletal: Denies arthralgia, myalgia, stiffness, Jt. Swelling, pain, limp or strain/sprain. Denies Falls. Skin: Denies puritis, rash, hives, warts, acne, eczema or change in skin lesion Neuro: No weakness, tremor, incoordination, spasms, paresthesia or pain Psychiatric: Denies confusion, memory loss or sensory loss. Denies Depression. Endocrine: Denies change in weight, skin, hair change, nocturia, and paresthesia, diabetic polys, visual blurring or hyper / hypo glycemic episodes.  Heme/Lymph: No excessive bleeding, bruising or enlarged lymph nodes.  Objective:     BP 134/58 mmHg  Pulse 64  Temp(Src) 97.3 F (36.3 C)  Resp 16  Ht 5' 10.5" (1.791 m)  Wt 212 lb 6.4 oz (96.344 kg)  BMI 30.04 kg/m2  General Appearance:  Alert  WD/WN, male , in no apparent distress. Eyes: PERRLA, EOMs, conjunctiva no swelling or erythema, normal fundi and vessels. Sinuses: No frontal/maxillary tenderness ENT/Mouth: EACs patent / TMs  nl. Nares clear without erythema, swelling, mucoid exudates. Oral hygiene is good. No erythema, swelling, or exudate. Tongue normal, non-obstructing. Tonsils not swollen or erythematous. Hearing normal.  Neck: Supple, thyroid normal. No bruits, nodes or JVD. Respiratory: Respiratory effort normal.  BS equal and clear bilateral without rales, rhonci, wheezing or stridor. Cardio: Heart sounds are normal with regular rate and rhythm and no murmurs, rubs or gallops. Peripheral pulses are normal and equal bilaterally  without edema. No aortic or femoral bruits. Chest: symmetric with normal excursions and percussion.  Abdomen: Flat, soft, with nl bowel sounds. Nontender, no guarding, rebound, hernias, masses, or organomegaly.  Lymphatics: Non tender without lymphadenopathy.  Genitourinary: No hernias.Testes nl. DRE - prostate nl for age - smooth & firm w/o nodules. Musculoskeletal: Full ROM all peripheral extremities, joint stability, 5/5 strength, and normal gait. Skin: Warm and dry without rashes, lesions, cyanosis, clubbing or  ecchymosis.  Neuro: Cranial nerves intact, reflexes equal bilaterally. Normal muscle tone, no cerebellar symptoms. Sensation intact.  Pysch: Awake and oriented X 3 with normal affect, insight and judgment appropriate.   Cognitive Testing  Alert? Yes  Normal Appearance? Yes  Oriented to person? Yes  Place? Yes   Time? Yes  Recall of three objects?  Yes  Can perform simple calculations? Yes  Displays appropriate  judgment? Yes  Can read the correct time from a watch/clock? Yes  Medicare Attestation I have personally reviewed: The patient's medical and social history Their use of alcohol, tobacco or illicit drugs Their current medications and supplements The patient's functional ability including ADLs,fall risks, home safety risks, cognitive, and hearing and visual impairment Diet and physical activities Evidence for depression or mood disorders  The patient's weight, height, BMI, and visual acuity have been recorded in the chart.  I have made referrals, counseling, and provided education to the patient based on review of the above and I have provided the patient with a written personalized care plan for preventive services.  Over 40 minutes of exam, counseling, chart review was performed.   Jaylen Knope DAVID, MD   09/16/2014

## 2014-09-17 LAB — BASIC METABOLIC PANEL WITH GFR
BUN: 25 mg/dL — ABNORMAL HIGH (ref 6–23)
CHLORIDE: 106 meq/L (ref 96–112)
CO2: 25 meq/L (ref 19–32)
CREATININE: 1.4 mg/dL — AB (ref 0.50–1.35)
Calcium: 9.2 mg/dL (ref 8.4–10.5)
GFR, Est African American: 52 mL/min — ABNORMAL LOW
GFR, Est Non African American: 45 mL/min — ABNORMAL LOW
Glucose, Bld: 120 mg/dL — ABNORMAL HIGH (ref 70–99)
POTASSIUM: 4.1 meq/L (ref 3.5–5.3)
Sodium: 141 mEq/L (ref 135–145)

## 2014-09-17 LAB — HEPATIC FUNCTION PANEL
ALT: 18 U/L (ref 0–53)
AST: 20 U/L (ref 0–37)
Albumin: 3.8 g/dL (ref 3.5–5.2)
Alkaline Phosphatase: 61 U/L (ref 39–117)
BILIRUBIN DIRECT: 0.1 mg/dL (ref 0.0–0.3)
BILIRUBIN TOTAL: 0.4 mg/dL (ref 0.2–1.2)
Indirect Bilirubin: 0.3 mg/dL (ref 0.2–1.2)
Total Protein: 6.7 g/dL (ref 6.0–8.3)

## 2014-09-17 LAB — LIPID PANEL
CHOLESTEROL: 154 mg/dL (ref 0–200)
HDL: 44 mg/dL (ref >=40)
LDL Cholesterol: 77 mg/dL (ref 0–99)
Total CHOL/HDL Ratio: 3.5 Ratio
Triglycerides: 166 mg/dL — ABNORMAL HIGH (ref <150)
VLDL: 33 mg/dL (ref 0–40)

## 2014-09-17 LAB — MAGNESIUM: MAGNESIUM: 1.9 mg/dL (ref 1.5–2.5)

## 2014-09-17 LAB — TSH: TSH: 1.181 u[IU]/mL (ref 0.350–4.500)

## 2014-09-17 LAB — VITAMIN D 25 HYDROXY (VIT D DEFICIENCY, FRACTURES): Vit D, 25-Hydroxy: 46 ng/mL (ref 30–100)

## 2014-09-29 ENCOUNTER — Other Ambulatory Visit: Payer: Self-pay | Admitting: Internal Medicine

## 2014-10-06 ENCOUNTER — Other Ambulatory Visit: Payer: Self-pay | Admitting: Internal Medicine

## 2014-10-09 ENCOUNTER — Telehealth: Payer: Self-pay | Admitting: Endocrinology

## 2014-10-09 NOTE — Telephone Encounter (Signed)
Patient stated that levemir flextouch is not working it not bring his sugar down, please advise

## 2014-10-10 NOTE — Telephone Encounter (Signed)
I contacted the pt. Blood sugar reading for the past 3 days are listed below.   10/07/2014: Before Breakfast:242 Before Lunch: 162  Before Supper: 165 Before Bed: 125   10/08/2014:  Before Breakfast: 130  Before Lunch: 211 Before Supper: 162  Before Bed: 159  10/09/2014:  Before Breakfast: 105  2 hours after Breakfast: 206 Before Lunch: 137  Before Supper:169  Before Bed: 164  This Morning: 220.  Pt confirmed he is taking 50 units of Levemir each morning.  Please advise, Thanks!

## 2014-10-10 NOTE — Telephone Encounter (Signed)
please call patient: Please continue the same insulin. i'll see you next time.

## 2014-10-10 NOTE — Telephone Encounter (Signed)
Left voicemail advising of note below. Requested call back if pt would like to discuss.

## 2014-11-04 ENCOUNTER — Ambulatory Visit: Payer: Medicare PPO | Admitting: Endocrinology

## 2014-11-18 ENCOUNTER — Other Ambulatory Visit: Payer: Self-pay | Admitting: Internal Medicine

## 2014-11-20 ENCOUNTER — Telehealth: Payer: Self-pay | Admitting: Endocrinology

## 2014-11-20 MED ORDER — GLUCOSE BLOOD VI STRP: ORAL_STRIP | Status: DC

## 2014-11-20 NOTE — Telephone Encounter (Signed)
Rx for patients test strips per his request.

## 2014-11-20 NOTE — Telephone Encounter (Signed)
Patient need refill of his test strips send to [facility], he need them today.

## 2014-11-24 ENCOUNTER — Ambulatory Visit: Payer: Medicare PPO | Admitting: Endocrinology

## 2014-11-28 ENCOUNTER — Encounter: Payer: Self-pay | Admitting: Endocrinology

## 2014-11-28 ENCOUNTER — Ambulatory Visit (INDEPENDENT_AMBULATORY_CARE_PROVIDER_SITE_OTHER): Payer: Medicare PPO | Admitting: Endocrinology

## 2014-11-28 VITALS — BP 140/60 | HR 58 | Temp 98.1°F | Wt 210.0 lb

## 2014-11-28 DIAGNOSIS — E1029 Type 1 diabetes mellitus with other diabetic kidney complication: Secondary | ICD-10-CM | POA: Diagnosis not present

## 2014-11-28 LAB — HEMOGLOBIN A1C: Hgb A1c MFr Bld: 8.1 % — ABNORMAL HIGH (ref 4.6–6.5)

## 2014-11-28 NOTE — Progress Notes (Signed)
Subjective:    Patient ID: Gregory Hatfield, male    DOB: 1925-11-13, 81 y.o.   MRN: [id number]  HPI Pt returns for f/u of diabetes mellitus: DM type: 1 Dx'ed: [id number] Complications: renal insuff and CHF. Therapy: insulin since 2003 DKA: never Severe hypoglycemia: never Pancreatitis: never. Other: he is on a simple insulin regimen, due to noncompliance with multiple daily injections.   Interval history: he brings a scant record of his cbg's which i have reviewed today.  It varies from 53-200. There is no trend throughout the day.  pt states he feels well in general.  Pt says he never misses the insulin.   Past Medical History  Diagnosis Date  . Diabetes mellitus     insulin-dependent  . CHF (congestive heart failure)   . GERD (gastroesophageal reflux disease)   . Hypertension   . Dyslipidemia   . Glaucoma   . CVA (cerebral infarction)     Left Eye  . Diabetic retinopathy   . CKD (chronic kidney disease)   . BPH (benign prostatic hyperplasia)   . History of nuclear stress test 03/2004    inf wall scar from base to apex & anterolateral wall ischemia  . Hyperlipidemia   . Vitamin D deficiency   . Bronchitis     hx of   . Arthritis     Past Surgical History  Procedure Laterality Date  . Cardiac catheterization  04/2004    normal r-sided pressure, mild pulm HTN  . Prostate surgery  10/2010    transurethral resection   . Transthoracic echocardiogram  05/2006    EF 60-70%; mild calcif of MV; mild MV regurg; LA mildly dilated  . Carpal tunnel release Right   . Lumbar spine surgery  2003  . Cervical spine surgery  1990's  . Total hip arthroplasty Left 01/17/2014    Procedure: LEFT TOTAL HIP ARTHROPLASTY;  Surgeon: Yvette Rack., MD;  Location: [place];  Service: Orthopedics;  Laterality: Left;  . Transurethral resection of prostate N/A 08/22/2014    Procedure: TRANSURETHRAL RESECTION OF THE PROSTATE (TURP);  Surgeon: Marcia Brash, MD;  Location: [facility];  Service: Urology;   Laterality: N/A;    History   Social History  . Marital Status: Widowed    Spouse Name: N/A  . Number of Children: 5  . Years of Education: master's   Occupational History  . teacher/instructor    Social History Main Topics  . Smoking status: Former Smoker    Quit date: 06/21/1983  . Smokeless tobacco: Never Used  . Alcohol Use: No  . Drug Use: No  . Sexual Activity: Not on file   Other Topics Concern  . Not on file   Social History Narrative    Current Outpatient Prescriptions on File Prior to Visit  Medication Sig Dispense Refill  . B-D UF III MINI PEN NEEDLES 31G X 5 MM MISC USE FOR INJECTIONS AS DIRECTED 100 each 2  . carvedilol (COREG) 12.5 MG tablet TAKE  (1)  TABLET TWICE A DAY FOR HIGH BLOOD PRESSURE. 60 tablet 3  . Cholecalciferol (VITAMIN D3) 5000 UNITS CAPS Take 1 capsule by mouth daily.     Marland Kitchen diltiazem (CARDIZEM CD) 240 MG 24 hr capsule TAKE (1) CAPSULE DAILY FOR HIGH BLOOD PRESSURE. 30 capsule 3  . docusate sodium (COLACE) 100 MG capsule Take 1 capsule (100 mg total) by mouth 2 (two) times daily. 10 capsule 0  . glucose blood (TRUETEST TEST) test strip CHECK  BLOOD SUGAR 6 TIMES A DAY. 200 each 2  . hydrALAZINE (APRESOLINE) 25 MG tablet Take 1 tablet (25 mg total) by mouth 3 (three) times daily. 90 tablet 3  . hyoscyamine (LEVSIN/SL) 0.125 MG SL tablet Place 1 tablet (0.125 mg total) under the tongue every 4 (four) hours as needed. 30 tablet 0  . Insulin Detemir (LEVEMIR FLEXTOUCH) 100 UNIT/ML Pen Inject 50 units into the skin daily. 15 mL 2  . LUMIGAN 0.01 % SOLN Place 1 drop into the right eye at bedtime.     . Mirabegron ER (MYRBETRIQ) 25 MG TB24 Take 25 mg by mouth daily. For bladder    . Multiple Vitamin (MULTIVITAMIN) tablet Take 1 tablet by mouth daily.      . pravastatin (PRAVACHOL) 40 MG tablet TAKE 1 TABLET ONCE DAILY. 90 tablet 0  . Probiotic Product (PROBIOTIC DAILY PO) Take 1 tablet by mouth daily.     Marland Kitchen SIMBRINZA 1-0.2 % SUSP Place 1 drop into  both eyes 3 (three) times daily.     No current facility-administered medications on file prior to visit.    Allergies  Allergen Reactions  . Ace Inhibitors Other (See Comments) and Cough    Chronic cough  . Atenolol     Atenolol eye drops  . Demerol Other (See Comments)    Hard to wake up  . Flomax [Tamsulosin Hcl] Other (See Comments)    Low BP  . Morphine And Related Other (See Comments)    hallucinations  . Oxytrol [Oxybutynin] Rash    Family History  Problem Relation Age of Onset  . Diabetes Mother   . Hypertension Mother   . Prostate cancer Father   . Heart disease Father   . Diabetes Brother   . Cancer Brother   . Heart disease Brother   . Diabetes Sister   . Heart disease Sister   . Hyperlipidemia Child   . Hypertension Child   . Diabetes Child   . Heart attack Child   . Cancer Child     BP 140/60 mmHg  Pulse 58  Temp(Src) 98.1 F (36.7 C) (Oral)  Wt 210 lb (95.255 kg)  SpO2 98%    Review of Systems     Objective:   Physical Exam VITAL SIGNS:  See vs page GENERAL: no distress Pulses: dorsalis pedis intact bilat.  CV: 1+ right leg edema, and 2+ on the left.  Feet: no deformity. There is spotty hyperpigmentation on the legs. There is bilateral onychomycosis.  Skin: feet are of normal color and temp. no ulcer on the feet.  Neuro: sensation is intact to touch on the feet.    Lab Results  Component Value Date   HGBA1C 8.1* 11/28/2014      Assessment & Plan:  DM: glycemic control is improved.  therapy limited by variable cbg's.  Due to frail elderly state, he is not a candidate for aggressive glycemic control.    Patient is advised the following: Patient Instructions  Please come back for a follow-up appointment in 3 months.  check your blood sugar 6 times a day--before the 3 meals, and at bedtime.  also check if you have symptoms of your blood sugar being too high or too low.  please keep a record of the readings and bring it to your next  appointment here.  please call us sooner if you are having low blood sugar episodes.   On this type of insulin schedule, you should eat meals on a regular  schedule.  If a meal is missed or significantly delayed, your blood sugar could go low. To avoid the lows, carry some packets of cheese and crackers.  This way, you can avoid the lows.   Please increase the levemir to 48 units each morning.   Please come back for a follow-up appointment in 3 months.    addendum: continue the same insulin

## 2014-11-28 NOTE — Patient Instructions (Signed)
Please come back for a follow-up appointment in 3 months.  check your blood sugar 6 times a day--before the 3 meals, and at bedtime.  also check if you have symptoms of your blood sugar being too high or too low.  please keep a record of the readings and bring it to your next appointment here.  please call us sooner if you are having low blood sugar episodes.   On this type of insulin schedule, you should eat meals on a regular schedule.  If a meal is missed or significantly delayed, your blood sugar could go low. To avoid the lows, carry some packets of cheese and crackers.  This way, you can avoid the lows.   Please increase the levemir to 48 units each morning.   Please come back for a follow-up appointment in 3 months.

## 2014-12-10 ENCOUNTER — Other Ambulatory Visit: Payer: Self-pay | Admitting: Endocrinology

## 2014-12-17 ENCOUNTER — Telehealth: Payer: Self-pay | Admitting: Endocrinology

## 2014-12-17 NOTE — Telephone Encounter (Signed)
Patient called and would like his last lab results   Please advise    Thank you

## 2014-12-17 NOTE — Telephone Encounter (Signed)
Left voicemail advising A1C was 8.1 and to continue the same insulin adjustments. No adjustments are needed at this time.

## 2014-12-18 ENCOUNTER — Ambulatory Visit (INDEPENDENT_AMBULATORY_CARE_PROVIDER_SITE_OTHER): Payer: Medicare PPO | Admitting: Internal Medicine

## 2014-12-18 ENCOUNTER — Encounter: Payer: Self-pay | Admitting: Internal Medicine

## 2014-12-18 VITALS — BP 134/66 | HR 60 | Temp 97.9°F | Resp 18 | Ht 71.0 in | Wt 215.0 lb

## 2014-12-18 DIAGNOSIS — N183 Chronic kidney disease, stage 3 unspecified: Secondary | ICD-10-CM

## 2014-12-18 DIAGNOSIS — G309 Alzheimer's disease, unspecified: Secondary | ICD-10-CM

## 2014-12-18 DIAGNOSIS — E782 Mixed hyperlipidemia: Secondary | ICD-10-CM

## 2014-12-18 DIAGNOSIS — H4010X0 Unspecified open-angle glaucoma, stage unspecified: Secondary | ICD-10-CM

## 2014-12-18 DIAGNOSIS — N189 Chronic kidney disease, unspecified: Secondary | ICD-10-CM

## 2014-12-18 DIAGNOSIS — E1142 Type 2 diabetes mellitus with diabetic polyneuropathy: Secondary | ICD-10-CM

## 2014-12-18 DIAGNOSIS — Z6830 Body mass index (BMI) 30.0-30.9, adult: Secondary | ICD-10-CM

## 2014-12-18 DIAGNOSIS — K219 Gastro-esophageal reflux disease without esophagitis: Secondary | ICD-10-CM

## 2014-12-18 DIAGNOSIS — C61 Malignant neoplasm of prostate: Secondary | ICD-10-CM

## 2014-12-18 DIAGNOSIS — Z1212 Encounter for screening for malignant neoplasm of rectum: Secondary | ICD-10-CM

## 2014-12-18 DIAGNOSIS — Z79899 Other long term (current) drug therapy: Secondary | ICD-10-CM

## 2014-12-18 DIAGNOSIS — E1029 Type 1 diabetes mellitus with other diabetic kidney complication: Secondary | ICD-10-CM

## 2014-12-18 DIAGNOSIS — Z9181 History of falling: Secondary | ICD-10-CM

## 2014-12-18 DIAGNOSIS — Z1331 Encounter for screening for depression: Secondary | ICD-10-CM

## 2014-12-18 DIAGNOSIS — F0280 Dementia in other diseases classified elsewhere without behavioral disturbance: Secondary | ICD-10-CM

## 2014-12-18 DIAGNOSIS — D631 Anemia in chronic kidney disease: Secondary | ICD-10-CM

## 2014-12-18 DIAGNOSIS — I150 Renovascular hypertension: Secondary | ICD-10-CM

## 2014-12-18 DIAGNOSIS — Z125 Encounter for screening for malignant neoplasm of prostate: Secondary | ICD-10-CM

## 2014-12-18 DIAGNOSIS — G629 Polyneuropathy, unspecified: Secondary | ICD-10-CM

## 2014-12-18 DIAGNOSIS — N400 Enlarged prostate without lower urinary tract symptoms: Secondary | ICD-10-CM

## 2014-12-18 DIAGNOSIS — E559 Vitamin D deficiency, unspecified: Secondary | ICD-10-CM

## 2014-12-18 DIAGNOSIS — E1342 Other specified diabetes mellitus with diabetic polyneuropathy: Secondary | ICD-10-CM

## 2014-12-18 NOTE — Progress Notes (Signed)
Patient ID: Gregory Hatfield, male   DOB: Dec 30, 1925, 81 y.o.   MRN: [id number]   Annual Comprehensive Examination  This very nice 81 y.o. [facility] presents for complete physical.  Patient has been followed for HTN, T1_IDDM w/ CKD 2 , Hyperlipidemia and Vitamin D Deficiency.   HTN predates since 46. Patient's BP has been controlled at home.Today's BP: 134/66 mmHg. Patient denies any cardiac symptoms as chest pain, palpitations, shortness of breath, dizziness or ankle swelling.   Patient's hyperlipidemia is controlled with diet and medications. Patient denies myalgias or other medication SE's. Last lipids were 09/16/2014: Cholesterol 154; HDL 44; LDL Cholesterol 77; Triglycerides 166 on      Patient was dx'd T2_DM in 1975 and was started on insulin in 1997. Patient has Diabetic CKD 3 (GFR 43 ml/min) . Patient is followed and managed by Dr Loanne Drilling for his diabetes. Patient also is followed by Dr Lanell Matar for his Glaucoma and is s/p bilat CE/IOL. Patient denies reactive hypoglycemic symptoms, visual blurring, diabetic polys or paresthesias. Last A1c was  8.1% on 11/28/2014.     Finally, patient has history of Vitamin D Deficiency of "28" in 2008 and last vitamin D was "46" on 09/16/2014.       Medication Sig  . carvedilol  12.5 MG tablet TAKE  (1)  TABLET TWICE A DAY FOR HIGH BLOOD PRESSURE.  Marland Kitchen VITAMIN D 5000 UNITS CAPS Take 1 capsule by mouth daily.   Marland Kitchen CARDIZEM CD 240 MG 24 hr  TAKE (1) CAPSULE DAILY FOR HIGH BLOOD PRESSURE.  Marland Kitchen COLACE 100 MG capsule Take 1 capsule (100 mg total) by mouth 2 (two) times daily.  . furosemide (LASIX) 40 MG tablet Take 40 mg by mouth daily.  . hydrALAZINE  25 MG tablet Take 1 tablet (25 mg total) by mouth 3 (three) times daily.  . hyoscyamine (LEVSIN/SL) 0.125 MG  Place 1 tablet (0.125 mg total) under the tongue every 4 (four) hours as needed.  Marland Kitchen losartan ( 50 MG tablet Take 50 mg by mouth daily.  Marland Kitchen LUMIGAN 0.01 % SOLN Place 1 drop into the right eye at bedtime.   .  Multiple Vitamin  Take 1 tablet by mouth daily.    . pravastatin  40 MG tablet TAKE 1 TABLET ONCE DAILY.  Marland Kitchen PROBIOTIC Take 1 tablet by mouth daily.   Marland Kitchen SIMBRINZA 1-0.2 % SUSP Place 1 drop into both eyes 3 (three) times daily.  . Mirabegron ER (MYRBETRIQ) 25 MG  Take 25 mg by mouth daily. For bladder   Allergies  Allergen Reactions  . Ace Inhibitors Other (See Comments) and Cough    Chronic cough  . Atenolol     Atenolol eye drops  . Demerol Other (See Comments)    Hard to wake up  . Flomax [Tamsulosin Hcl] Other (See Comments)    Low BP  . Morphine And Related Other (See Comments)    hallucinations  . Oxytrol [Oxybutynin] Rash   Past Medical History  Diagnosis Date  . Diabetes mellitus     insulin-dependent  . CHF (congestive heart failure)   . GERD (gastroesophageal reflux disease)   . Hypertension   . Dyslipidemia   . Glaucoma   . CVA (cerebral infarction)     Left Eye  . Diabetic retinopathy   . CKD (chronic kidney disease)   . BPH (benign prostatic hyperplasia)   . History of nuclear stress test 03/2004    inf wall scar from base to apex & anterolateral  wall ischemia  . Hyperlipidemia   . Vitamin D deficiency   . Bronchitis     hx of   . Arthritis    Health Maintenance  Topic Date Due  . OPHTHALMOLOGY EXAM  11/08/1937  . ZOSTAVAX  11/09/1987  . URINE MICROALBUMIN  12/12/2014  . INFLUENZA VACCINE  01/19/2015  . HEMOGLOBIN A1C  05/30/2015  . FOOT EXAM  11/28/2015  . COLONOSCOPY  06/20/2018  . TETANUS/TDAP  08/29/2018  . PNA vac Low Risk Adult  Completed   Immunization History  Administered Date(s) Administered  . DTaP 09/28/2006  . Influenza Whole 04/05/2010, 04/04/2013  . Influenza, High Dose Seasonal PF 04/02/2014  . Influenza-Unspecified 02/19/2011  . Pneumococcal Conjugate-13 06/10/2014  . Pneumococcal Polysaccharide-23 07/21/2001, 03/20/2008  . Td 08/28/2008   Past Surgical History  Procedure Laterality Date  . Cardiac catheterization  04/2004     normal r-sided pressure, mild pulm HTN  . Prostate surgery  10/2010    transurethral resection   . Transthoracic echocardiogram  05/2006    EF 60-70%; mild calcif of MV; mild MV regurg; LA mildly dilated  . Carpal tunnel release Right   . Lumbar spine surgery  2003  . Cervical spine surgery  1990's  . Total hip arthroplasty Left 01/17/2014    Procedure: LEFT TOTAL HIP ARTHROPLASTY;  Surgeon: Yvette Rack., MD;  Location: [place];  Service: Orthopedics;  Laterality: Left;  . Transurethral resection of prostate N/A 08/22/2014    Procedure: TRANSURETHRAL RESECTION OF THE PROSTATE (TURP);  Surgeon: Marcia Brash, MD;  Location: [facility];  Service: Urology;  Laterality: N/A;   Family History  Problem Relation Age of Onset  . Diabetes Mother   . Hypertension Mother   . Prostate cancer Father   . Heart disease Father   . Diabetes Brother   . Cancer Brother   . Heart disease Brother   . Diabetes Sister   . Heart disease Sister   . Hyperlipidemia Child   . Hypertension Child   . Diabetes Child   . Heart attack Child   . Cancer Child    History   Social History  . Marital Status: Widowed    Spouse Name: N/A  . Number of Children: 5  . Years of Education: master's   Occupational History  . teacher/instructor    Social History Main Topics  . Smoking status: Former Smoker    Quit date: 06/21/1983  . Smokeless tobacco: Never Used  . Alcohol Use: No  . Drug Use: No  . Sexual Activity: No    ROS Constitutional: Denies fever, chills, weight loss/gain, headaches, insomnia,  night sweats or change in appetite. Does c/o fatigue. Eyes: Denies redness, blurred vision, diplopia, discharge, itchy or watery eyes.  ENT: Denies discharge, congestion, post nasal drip, epistaxis, sore throat, earache, hearing loss, dental pain, Tinnitus, Vertigo, Sinus pain or snoring.  Cardio: Denies chest pain, palpitations, irregular heartbeat, syncope, dyspnea, diaphoresis, orthopnea, PND, claudication  or edema Respiratory: denies cough, dyspnea, DOE, pleurisy, hoarseness, laryngitis or wheezing.  Gastrointestinal: Denies dysphagia, heartburn, reflux, water brash, pain, cramps, nausea, vomiting, bloating, diarrhea, constipation, hematemesis, melena, hematochezia, jaundice or hemorrhoids Genitourinary: Denies dysuria, frequency, urgency, nocturia, hesitancy, discharge, hematuria or flank pain Musculoskeletal: Denies arthralgia, myalgia, stiffness, Jt. Swelling, pain, limp or strain/sprain. Denies Falls. Skin: Denies puritis, rash, hives, warts, acne, eczema or change in skin lesion Neuro: No weakness, tremor, incoordination, spasms, paresthesia or pain Psychiatric: Denies confusion, memory loss or sensory loss. Denies Depression. Endocrine:  Denies change in weight, skin, hair change, nocturia, and paresthesia, diabetic polys, visual blurring or hyper / hypo glycemic episodes.  Heme/Lymph: No excessive bleeding, bruising or enlarged lymph nodes.  Physical Exam  BP 134/66   Pulse 60  Temp 97.9 F   Resp 18  Ht 5\' 11"    Wt 215 lb     BMI 30.00   General Appearance: Well nourished, in no apparent distress. Eyes: PERRLA, EOMs, conjunctiva no swelling or erythema, normal fundi and vessels. Sinuses: No frontal/maxillary tenderness ENT/Mouth: EACs patent / TMs  nl. Nares clear without erythema, swelling, mucoid exudates. Oral hygiene is good. No erythema, swelling, or exudate. Tongue normal, non-obstructing. Tonsils not swollen or erythematous. Hearing normal.  Neck: Supple, thyroid normal. No bruits, nodes or JVD. Respiratory: Respiratory effort normal.  BS equal and clear bilateral without rales, rhonci, wheezing or stridor. Cardio: Heart sounds are normal with regular rate and rhythm and no murmurs, rubs or gallops. Peripheral pulses are normal and equal bilaterally without edema. No aortic or femoral bruits. Chest: symmetric with normal excursions and percussion.  Abdomen: Flat, soft, with  bowel sounds. Nontender, no guarding, rebound, hernias, masses, or organomegaly.  Lymphatics: Non tender without lymphadenopathy.  Genitourinary: No hernias.Testes nl. DRE - prostate nl for age - smooth & firm w/o nodules. Musculoskeletal: Full ROM all peripheral extremities, joint stability, 5/5 strength, and normal gait. Skin: Warm and dry without rashes, lesions, cyanosis, clubbing or  ecchymosis.  Neuro: Cranial nerves intact, reflexes equal bilaterally. Normal muscle tone, no cerebellar symptoms. Sensation intact.  Pysch: Awake and oriented X 3 with normal affect, insight and judgment appropriate.   Assessment and Plan  1. Benign renovascular hypertension  - EKG 12-Lead - [place], RETROPERITNL ABD,  LTD - TSH  2. Hyperlipidemia  - Lipid panel  3. T1_IDDM w/CKD3 (GFR 43 ml/min)  - Microalbumin / creatinine urine ratio - [facility] DIABETES FOOT EXAM - LOW EXTREMITY NEUR EXAM DOCUM - Hemoglobin A1c  4. Vitamin D deficiency  - Vit D  25 hydroxy   5. Open-angle glaucoma, stage unspecified   6. Chronic renal insufficiency, stage III    7. Anemia in chronic renal disease   8. Gastroesophageal reflux disease   9. Alzheimer's disease   10. BPH (benign prostatic hyperplasia)   11. Prostate cancer   12. Screening for rectal cancer  - POC Hemoccult Bld/Stl   13. Prostate cancer screening  - PSA  14. At low risk for fall   15. Depression screen   16. Medication management  - Urine Microscopic - CBC with Differential/Platelet - BASIC METABOLIC PANEL WITH GFR - Hepatic function panel - Magnesium  17. Diabetic peripheral neuropathy   18. BMI 30.0-30.9,adult   Continue prudent diet as discussed, weight control, BP monitoring, regular exercise, and medications as discussed.  Discussed med effects and SE's. Routine screening labs and tests as requested with regular follow-up as recommended.  Over 40 minutes of exam, counseling &  chart review was performed

## 2014-12-18 NOTE — Patient Instructions (Signed)
Recommend Adult Low dose Aspirin or baby Aspirin 81 mg daily   To reduce risk of Colon Cancer 20 %,   Skin Cancer 26 % ,   Melanoma 46%   and   Pancreatic cancer 60%  ++++++++++++++++++  Vitamin D goal is between 70-100.   Please make sure that you are taking your Vitamin D as directed.   It is very important as a natural anti-inflammatory   helping hair, skin, and nails, as well as reducing stroke and heart attack risk.   It helps your bones and helps with mood.  It also decreases numerous cancer risks so please take it as directed.   Low Vit D is associated with a 200-300% higher risk for CANCER   and 200-300% higher risk for HEART   ATTACK  &  STROKE.    .....................................Marland Kitchen  It is also associated with higher death rate at younger ages,   autoimmune diseases like Rheumatoid arthritis, Lupus, Multiple Sclerosis.     Also many other serious conditions, like depression, Alzheimer's  Dementia, infertility, muscle aches, fatigue, fibromyalgia - just to name a few.  +++++++++++++++++++  .[id number]  Preventive Care for Adults  A healthy lifestyle and preventive care can promote health and wellness. Preventive health guidelines for men include the following key practices:  A routine yearly physical is a good way to check with your health care provider about your health and preventative screening. It is a chance to share any concerns and updates on your health and to receive a thorough exam.  Visit your dentist for a routine exam and preventative care every 6 months. Brush your teeth twice a day and floss once a day. Good oral hygiene prevents tooth decay and gum disease.  The frequency of eye exams is based on your age, health, family medical history, use of contact lenses, and other factors. Follow your health care provider's recommendations for frequency of eye exams.  Eat a healthy diet. Foods such as vegetables, fruits, whole grains, low-fat dairy  products, and lean protein foods contain the nutrients you need without too many calories. Decrease your intake of foods high in solid fats, added sugars, and salt. Eat the right amount of calories for you.Get information about a proper diet from your health care provider, if necessary.  Regular physical exercise is one of the most important things you can do for your health. Most adults should get at least 150 minutes of moderate-intensity exercise (any activity that increases your heart rate and causes you to sweat) each week. In addition, most adults need muscle-strengthening exercises on 2 or more days a week.  Maintain a healthy weight. The body mass index (BMI) is a screening tool to identify possible weight problems. It provides an estimate of body fat based on height and weight. Your health care provider can find your BMI and can help you achieve or maintain a healthy weight.For adults 20 years and older:  A BMI below 18.5 is considered underweight.  A BMI of 18.5 to 24.9 is normal.  A BMI of 25 to 29.9 is considered overweight.  A BMI of 30 and above is considered obese.  Maintain normal blood lipids and cholesterol levels by exercising and minimizing your intake of saturated fat. Eat a balanced diet with plenty of fruit and vegetables. Blood tests for lipids and cholesterol should begin at age 41 and be repeated every 5 years. If your lipid or cholesterol levels are high, you are over 50, or you are at  high risk for heart disease, you may need your cholesterol levels checked more frequently.Ongoing high lipid and cholesterol levels should be treated with medicines if diet and exercise are not working.  If you smoke, find out from your health care provider how to quit. If you do not use tobacco, do not start.  Lung cancer screening is recommended for adults aged 59-80 years who are at high risk for developing lung cancer because of a history of smoking. A yearly low-dose CT scan of the  lungs is recommended for people who have at least a 30-pack-year history of smoking and are a current smoker or have quit within the past 15 years. A pack year of smoking is smoking an average of 1 pack of cigarettes a day for 1 year (for example: 1 pack a day for 30 years or 2 packs a day for 15 years). Yearly screening should continue until the smoker has stopped smoking for at least 15 years. Yearly screening should be stopped for people who develop a health problem that would prevent them from having lung cancer treatment.  If you choose to drink alcohol, do not have more than 2 drinks per day. One drink is considered to be 12 ounces (355 mL) of beer, 5 ounces (148 mL) of wine, or 1.5 ounces (44 mL) of liquor.  Avoid use of street drugs. Do not share needles with anyone. Ask for help if you need support or instructions about stopping the use of drugs.  High blood pressure causes heart disease and increases the risk of stroke. Your blood pressure should be checked at least every 1-2 years. Ongoing high blood pressure should be treated with medicines, if weight loss and exercise are not effective.  If you are 24-33 years old, ask your health care provider if you should take aspirin to prevent heart disease.  Diabetes screening involves taking a blood sample to check your fasting blood sugar level. Testing should be considered at a younger age or be carried out more frequently if you are overweight and have at least 1 risk factor for diabetes.  Colorectal cancer can be detected and often prevented. Most routine colorectal cancer screening begins at the age of 7 and continues through age 56. However, your health care provider may recommend screening at an earlier age if you have risk factors for colon cancer. On a yearly basis, your health care provider may provide home test kits to check for hidden blood in the stool. Use of a small camera at the end of a tube to directly examine the colon  (sigmoidoscopy or colonoscopy) can detect the earliest forms of colorectal cancer. Talk to your health care provider about this at age 23, when routine screening begins. Direct exam of the colon should be repeated every 5-10 years through age 20, unless early forms of precancerous polyps or small growths are found.  Hepatitis C blood testing is recommended for all people born from [birth year] through 1965 and any individual with known risks for hepatitis C.  Screening for abdominal aortic aneurysm (AAA)  by ultrasound is recommended for people who have history of high blood pressure or who are current or former smokers.  Healthy men should  receive prostate-specific antigen (PSA) blood tests as part of routine cancer screening. Talk with your health care provider about prostate cancer screening.  Testicular cancer screening is  recommended for adult males. Screening includes self-exam, a health care provider exam, and other screening tests. Consult with your health care provider  about any symptoms you have or any concerns you have about testicular cancer.  Use sunscreen. Apply sunscreen liberally and repeatedly throughout the day. You should seek shade when your shadow is shorter than you. Protect yourself by wearing long sleeves, pants, a wide-brimmed hat, and sunglasses year round, whenever you are outdoors.  Once a month, do a whole-body skin exam, using a mirror to look at the skin on your back. Tell your health care provider about new moles, moles that have irregular borders, moles that are larger than a pencil eraser, or moles that have changed in shape or color.  Stay current with required vaccines (immunizations).  Influenza vaccine. All adults should be immunized every year.  Tetanus, diphtheria, and acellular pertussis (Td, Tdap) vaccine. An adult who has not previously received Tdap or who does not know his vaccine status should receive 1 dose of Tdap. This initial dose should be followed by  tetanus and diphtheria toxoids (Td) booster doses every 10 years. Adults with an unknown or incomplete history of completing a 3-dose immunization series with Td-containing vaccines should begin or complete a primary immunization series including a Tdap dose. Adults should receive a Td booster every 10 years.  Zoster vaccine. One dose is recommended for adults aged 2 years or older unless certain conditions are present.    PREVNAR - Pneumococcal 13-valent conjugate (PCV13) vaccine. When indicated, a person who is uncertain of his immunization history and has no record of immunization should receive the PCV13 vaccine. An adult aged 14 years or older who has certain medical conditions and has not been previously immunized should receive 1 dose of PCV13 vaccine. This PCV13 should be followed with a dose of pneumococcal polysaccharide (PPSV23) vaccine. The PPSV23 vaccine dose should be obtained at least 8 weeks after the dose of PCV13 vaccine. An adult aged 53 years or older who has certain medical conditions and previously received 1 or more doses of PPSV23 vaccine should receive 1 dose of PCV13. The PCV13 vaccine dose should be obtained 1 or more years after the last PPSV23 vaccine dose.    PNEUMOVAX - Pneumococcal polysaccharide (PPSV23) vaccine. When PCV13 is also indicated, PCV13 should be obtained first. All adults aged 22 years and older should be immunized. An adult younger than age 73 years who has certain medical conditions should be immunized. Any person who resides in a nursing home or long-term care facility should be immunized. An adult smoker should be immunized. People with an immunocompromised condition and certain other conditions should receive both PCV13 and PPSV23 vaccines. People with human immunodeficiency virus (HIV) infection should be immunized as soon as possible after diagnosis. Immunization during chemotherapy or radiation therapy should be avoided. Routine use of PPSV23 vaccine  is not recommended for American Indians, [place] Natives, or people younger than 65 years unless there are medical conditions that require PPSV23 vaccine. When indicated, people who have unknown immunization and have no record of immunization should receive PPSV23 vaccine. One-time revaccination 5 years after the first dose of PPSV23 is recommended for people aged 19-64 years who have chronic kidney failure, nephrotic syndrome, asplenia, or immunocompromised conditions. People who received 1-2 doses of PPSV23 before age 57 years should receive another dose of PPSV23 vaccine at age 46 years or later if at least 5 years have passed since the previous dose. Doses of PPSV23 are not needed for people immunized with PPSV23 at or after age 32 years.    Hepatitis A vaccine. Adults who wish to be  protected from this disease, have certain high-risk conditions, work with hepatitis A-infected animals, work in hepatitis A research labs, or travel to or work in countries with a high rate of hepatitis A should be immunized. Adults who were previously unvaccinated and who anticipate close contact with an international adoptee during the first 60 days after arrival in the [place] from a country with a high rate of hepatitis A should be immunized.    Hepatitis B vaccine. Adults should be immunized if they wish to be protected from this disease, have certain high-risk conditions, may be exposed to blood or other infectious body fluids, are household contacts or sex partners of hepatitis B positive people, are clients or workers in certain care facilities, or travel to or work in countries with a high rate of hepatitis B.   Preventive Service / Frequency   Ages 61 and over  Blood pressure check.  Lipid and cholesterol check.  Lung cancer screening. / Every year if you are aged 39-80 years and have a 30-pack-year history of smoking and currently smoke or have quit within the past 15 years. Yearly screening is  stopped once you have quit smoking for at least 15 years or develop a health problem that would prevent you from having lung cancer treatment.  Fecal occult blood test (FOBT) of stool. You may not have to do this test if you get a colonoscopy every 10 years.  Flexible sigmoidoscopy** or colonoscopy.** / Every 5 years for a flexible sigmoidoscopy or every 10 years for a colonoscopy beginning at age 2 and continuing until age 46.  Hepatitis C blood test.** / For all people born from [birth year] through 1965 and any individual with known risks for hepatitis C.  Abdominal aortic aneurysm (AAA) screening./ Screening current or former smokers or have Hypertension.  Skin self-exam. / Monthly.  Influenza vaccine. / Every year.  Tetanus, diphtheria, and acellular pertussis (Tdap/Td) vaccine.** / 1 dose of Td every 10 years.   Zoster vaccine.** / 1 dose for adults aged 26 years or older.         Pneumococcal 13-valent conjugate (PCV13) vaccine.    Pneumococcal polysaccharide (PPSV23) vaccine.     Hepatitis A vaccine.** / Consult your health care provider.  Hepatitis B vaccine.** / Consult your health care provider. Screening for abdominal aortic aneurysm (AAA)  by ultrasound is recommended for people who have history of high blood pressure or who are current or former smokers.

## 2014-12-19 LAB — LIPID PANEL
CHOL/HDL RATIO: 3.1 ratio
Cholesterol: 134 mg/dL (ref 0–200)
HDL: 43 mg/dL (ref >=40)
LDL CALC: 74 mg/dL (ref 0–99)
Triglycerides: 83 mg/dL (ref <150)
VLDL: 17 mg/dL (ref 0–40)

## 2014-12-19 LAB — CBC WITH DIFFERENTIAL/PLATELET
Basophils Absolute: 0.1 10*3/uL (ref 0.0–0.1)
Basophils Relative: 1 % (ref 0–1)
Eosinophils Absolute: 0.2 10*3/uL (ref 0.0–0.7)
Eosinophils Relative: 3 % (ref 0–5)
HCT: 39.4 % (ref 39.0–52.0)
Hemoglobin: 12.9 g/dL — ABNORMAL LOW (ref 13.0–17.0)
LYMPHS ABS: 2.4 10*3/uL (ref 0.7–4.0)
Lymphocytes Relative: 40 % (ref 12–46)
MCH: 29.4 pg (ref 26.0–34.0)
MCHC: 32.7 g/dL (ref 30.0–36.0)
MCV: 89.7 fL (ref 78.0–100.0)
MPV: 11.8 fL (ref 8.6–12.4)
Monocytes Absolute: 0.5 10*3/uL (ref 0.1–1.0)
Monocytes Relative: 8 % (ref 3–12)
Neutro Abs: 2.8 10*3/uL (ref 1.7–7.7)
Neutrophils Relative %: 48 % (ref 43–77)
Platelets: 173 10*3/uL (ref 150–400)
RBC: 4.39 MIL/uL (ref 4.22–5.81)
RDW: 14 % (ref 11.5–15.5)
WBC: 5.9 10*3/uL (ref 4.0–10.5)

## 2014-12-19 LAB — MICROALBUMIN / CREATININE URINE RATIO
CREATININE, URINE: 117.3 mg/dL
MICROALB/CREAT RATIO: 40.9 mg/g — AB (ref 0.0–30.0)
Microalb, Ur: 4.8 mg/dL — ABNORMAL HIGH (ref <2.0)

## 2014-12-19 LAB — BASIC METABOLIC PANEL WITH GFR
BUN: 27 mg/dL — ABNORMAL HIGH (ref 6–23)
CO2: 28 meq/L (ref 19–32)
CREATININE: 1.47 mg/dL — AB (ref 0.50–1.35)
Calcium: 9.4 mg/dL (ref 8.4–10.5)
Chloride: 108 mEq/L (ref 96–112)
GFR, EST NON AFRICAN AMERICAN: 42 mL/min — AB
GFR, Est African American: 49 mL/min — ABNORMAL LOW
GLUCOSE: 70 mg/dL (ref 70–99)
POTASSIUM: 3.9 meq/L (ref 3.5–5.3)
Sodium: 146 mEq/L — ABNORMAL HIGH (ref 135–145)

## 2014-12-19 LAB — URINALYSIS, MICROSCOPIC ONLY
Bacteria, UA: NONE SEEN
CRYSTALS: NONE SEEN
Casts: NONE SEEN
Squamous Epithelial / LPF: NONE SEEN

## 2014-12-19 LAB — HEPATIC FUNCTION PANEL
ALK PHOS: 65 U/L (ref 39–117)
ALT: 22 U/L (ref 0–53)
AST: 21 U/L (ref 0–37)
Albumin: 3.9 g/dL (ref 3.5–5.2)
BILIRUBIN INDIRECT: 0.4 mg/dL (ref 0.2–1.2)
Bilirubin, Direct: 0.1 mg/dL (ref 0.0–0.3)
Total Bilirubin: 0.5 mg/dL (ref 0.2–1.2)
Total Protein: 6.7 g/dL (ref 6.0–8.3)

## 2014-12-19 LAB — TSH: TSH: 1.315 u[IU]/mL (ref 0.350–4.500)

## 2014-12-19 LAB — MAGNESIUM: Magnesium: 1.9 mg/dL (ref 1.5–2.5)

## 2014-12-19 LAB — VITAMIN D 25 HYDROXY (VIT D DEFICIENCY, FRACTURES): Vit D, 25-Hydroxy: 54 ng/mL (ref 30–100)

## 2014-12-19 LAB — PSA: PSA: 0.66 ng/mL (ref <=4.00)

## 2014-12-19 LAB — HEMOGLOBIN A1C
HEMOGLOBIN A1C: 8.4 % — AB (ref <5.7)
Mean Plasma Glucose: 194 mg/dL — ABNORMAL HIGH (ref <117)

## 2014-12-25 ENCOUNTER — Telehealth: Payer: Self-pay | Admitting: *Deleted

## 2014-12-25 NOTE — Telephone Encounter (Signed)
Patient called and stated he saw the DX of Alzheimer's Disease on his MyChart.  He said he was not aware of this DX and asked what he should do.  Per Dr Melford Aase, it is good to keep your mind active and he suggest patient do crossword puzzles, word search puzzle or activities that help him think  Left message on his VM to inform patient.

## 2014-12-26 ENCOUNTER — Telehealth: Payer: Self-pay | Admitting: Endocrinology

## 2014-12-26 NOTE — Telephone Encounter (Signed)
i get what you are saying, but your blood sugar is too variable to safely increase the insulin.

## 2014-12-26 NOTE — Telephone Encounter (Signed)
Patient called stating that his blood sugars have been high   Normal is: 138  High is: 178 with dizziness   Please advise   Thank you

## 2014-12-26 NOTE — Telephone Encounter (Signed)
Left voicemail advising of note below. Requested call back if the pt would like to discuss.  

## 2014-12-26 NOTE — Telephone Encounter (Signed)
See note below and please advise, Thanks! 

## 2014-12-29 ENCOUNTER — Other Ambulatory Visit: Payer: Self-pay | Admitting: Internal Medicine

## 2015-01-08 ENCOUNTER — Other Ambulatory Visit: Payer: Self-pay | Admitting: Internal Medicine

## 2015-02-06 ENCOUNTER — Other Ambulatory Visit: Payer: Self-pay | Admitting: Internal Medicine

## 2015-02-26 LAB — HM DIABETES EYE EXAM

## 2015-03-02 ENCOUNTER — Ambulatory Visit (INDEPENDENT_AMBULATORY_CARE_PROVIDER_SITE_OTHER): Payer: Medicare PPO | Admitting: Endocrinology

## 2015-03-02 ENCOUNTER — Other Ambulatory Visit: Payer: Self-pay | Admitting: Endocrinology

## 2015-03-02 ENCOUNTER — Encounter: Payer: Self-pay | Admitting: Endocrinology

## 2015-03-02 VITALS — BP 137/77 | HR 59 | Temp 97.6°F | Wt 212.0 lb

## 2015-03-02 DIAGNOSIS — E1029 Type 1 diabetes mellitus with other diabetic kidney complication: Secondary | ICD-10-CM | POA: Diagnosis not present

## 2015-03-02 LAB — POCT GLYCOSYLATED HEMOGLOBIN (HGB A1C): Hemoglobin A1C: 7.6

## 2015-03-02 NOTE — Progress Notes (Signed)
Subjective:    Patient ID: Gregory Hatfield, male    DOB: Dec 06, 1925, 81 y.o.   MRN: [id number]  HPI Pt returns for f/u of diabetes mellitus: DM type: 1 Dx'ed: [id number] Complications: renal insuff and CHF. Therapy: insulin since 2003 DKA: never Severe hypoglycemia: never Pancreatitis: never. Other: he is on a simple insulin regimen, due to noncompliance with multiple daily injections.   Interval history: he brings a record of his cbg's which i have reviewed today.  It varies from 53-289. There is no trend throughout the day.  pt states he feels well in general.  Pt says he never misses the insulin.   Past Medical History  Diagnosis Date  . Diabetes mellitus     insulin-dependent  . CHF (congestive heart failure)   . GERD (gastroesophageal reflux disease)   . Hypertension   . Dyslipidemia   . Glaucoma   . CVA (cerebral infarction)     Left Eye  . Diabetic retinopathy   . CKD (chronic kidney disease)   . BPH (benign prostatic hyperplasia)   . History of nuclear stress test 03/2004    inf wall scar from base to apex & anterolateral wall ischemia  . Hyperlipidemia   . Vitamin D deficiency   . Bronchitis     hx of   . Arthritis     Past Surgical History  Procedure Laterality Date  . Cardiac catheterization  04/2004    normal r-sided pressure, mild pulm HTN  . Prostate surgery  10/2010    transurethral resection   . Transthoracic echocardiogram  05/2006    EF 60-70%; mild calcif of MV; mild MV regurg; LA mildly dilated  . Carpal tunnel release Right   . Lumbar spine surgery  2003  . Cervical spine surgery  1990's  . Total hip arthroplasty Left 01/17/2014    Procedure: LEFT TOTAL HIP ARTHROPLASTY;  Surgeon: Yvette Rack., MD;  Location: [facility];  Service: Orthopedics;  Laterality: Left;  . Transurethral resection of prostate N/A 08/22/2014    Procedure: TRANSURETHRAL RESECTION OF THE PROSTATE (TURP);  Surgeon: Marcia Brash, MD;  Location: [facility];  Service: Urology;   Laterality: N/A;    Social History   Social History  . Marital Status: Widowed    Spouse Name: N/A  . Number of Children: 5  . Years of Education: master's   Occupational History  . teacher/instructor    Social History Main Topics  . Smoking status: Former Smoker    Quit date: 06/21/1983  . Smokeless tobacco: Never Used  . Alcohol Use: No  . Drug Use: No  . Sexual Activity: Not on file   Other Topics Concern  . Not on file   Social History Narrative    Current Outpatient Prescriptions on File Prior to Visit  Medication Sig Dispense Refill  . B-D UF III MINI PEN NEEDLES 31G X 5 MM MISC USE FOR INJECTIONS AS DIRECTED 100 each 2  . carvedilol (COREG) 12.5 MG tablet TAKE  (1)  TABLET TWICE A DAY FOR HIGH BLOOD PRESSURE. 60 tablet 0  . Cholecalciferol (VITAMIN D3) 5000 UNITS CAPS Take 1 capsule by mouth daily.     Marland Kitchen diltiazem (CARDIZEM CD) 240 MG 24 hr capsule TAKE (1) CAPSULE DAILY FOR HIGH BLOOD PRESSURE. 30 capsule 3  . docusate sodium (COLACE) 100 MG capsule Take 1 capsule (100 mg total) by mouth 2 (two) times daily. 10 capsule 0  . furosemide (LASIX) 40 MG tablet Take 40  mg by mouth daily.    Marland Kitchen glucose blood (TRUETEST TEST) test strip CHECK BLOOD SUGAR 6 TIMES A DAY. 200 each 2  . hydrALAZINE (APRESOLINE) 25 MG tablet Take 1 tablet (25 mg total) by mouth 3 (three) times daily. 90 tablet 3  . hyoscyamine (LEVSIN/SL) 0.125 MG SL tablet Place 1 tablet (0.125 mg total) under the tongue every 4 (four) hours as needed. 30 tablet 0  . losartan (COZAAR) 50 MG tablet Take 50 mg by mouth daily.    Marland Kitchen LUMIGAN 0.01 % SOLN Place 1 drop into the right eye at bedtime.     . Multiple Vitamin (MULTIVITAMIN) tablet Take 1 tablet by mouth daily.      Marland Kitchen MYRBETRIQ 50 MG TB24 tablet     . pravastatin (PRAVACHOL) 40 MG tablet TAKE 1 TABLET ONCE DAILY. 90 tablet 2  . Probiotic Product (PROBIOTIC DAILY PO) Take 1 tablet by mouth daily.     Marland Kitchen SIMBRINZA 1-0.2 % SUSP Place 1 drop into both eyes 3  (three) times daily.     No current facility-administered medications on file prior to visit.    Allergies  Allergen Reactions  . Ace Inhibitors Other (See Comments) and Cough    Chronic cough  . Atenolol     Atenolol eye drops  . Demerol Other (See Comments)    Hard to wake up  . Flomax [Tamsulosin Hcl] Other (See Comments)    Low BP  . Morphine And Related Other (See Comments)    hallucinations  . Oxytrol [Oxybutynin] Rash    Family History  Problem Relation Age of Onset  . Diabetes Mother   . Hypertension Mother   . Prostate cancer Father   . Heart disease Father   . Diabetes Brother   . Cancer Brother   . Heart disease Brother   . Diabetes Sister   . Heart disease Sister   . Hyperlipidemia Child   . Hypertension Child   . Diabetes Child   . Heart attack Child   . Cancer Child     BP 137/77 mmHg  Pulse 59  Temp(Src) 97.6 F (36.4 C) (Oral)  Wt 212 lb (96.163 kg)  SpO2 95%  Review of Systems He denies LOC    Objective:   Physical Exam VITAL SIGNS:  See vs page GENERAL: no distress Pulses: dorsalis pedis intact bilat.  CV: 2+ bilat leg edema.  Feet: no deformity. There is spotty hyperpigmentation on the legs and feet. There is bilateral onychomycosis.  Skin: feet are of normal color and temp. no ulcer on the feet.  Neuro: sensation is intact to touch on the feet.    A1c=7.6%    Assessment & Plan:  DM: overcontrolled, given this regimen, which does match insulin to his changing needs throughout the day Hypoglycemia, due to insulin, persistent Frail elderly state: in this state, we should minimize hypoglycemia.    Patient is advised the following: Patient Instructions  check your blood sugar 6 times a day--before the 3 meals, and at bedtime.  also check if you have symptoms of your blood sugar being too high or too low.  please keep a record of the readings and bring it to your next appointment here.  please call us sooner if you are having low  blood sugar episodes.   On this type of insulin schedule, you should eat meals on a regular schedule.  If a meal is missed or significantly delayed, your blood sugar could go low. To avoid  the lows, carry some packets of cheese and crackers.  This way, you can avoid the lows.   Please reduce the levemir to 48 units each morning.   Please come back for a follow-up appointment in 4 months.

## 2015-03-02 NOTE — Patient Instructions (Addendum)
check your blood sugar 6 times a day--before the 3 meals, and at bedtime.  also check if you have symptoms of your blood sugar being too high or too low.  please keep a record of the readings and bring it to your next appointment here.  please call us sooner if you are having low blood sugar episodes.   On this type of insulin schedule, you should eat meals on a regular schedule.  If a meal is missed or significantly delayed, your blood sugar could go low. To avoid the lows, carry some packets of cheese and crackers.  This way, you can avoid the lows.   Please reduce the levemir to 48 units each morning.   Please come back for a follow-up appointment in 4 months.

## 2015-03-09 ENCOUNTER — Other Ambulatory Visit: Payer: Self-pay | Admitting: Internal Medicine

## 2015-03-12 ENCOUNTER — Other Ambulatory Visit: Payer: Self-pay | Admitting: Endocrinology

## 2015-03-23 ENCOUNTER — Other Ambulatory Visit: Payer: Self-pay | Admitting: Internal Medicine

## 2015-03-30 ENCOUNTER — Ambulatory Visit (INDEPENDENT_AMBULATORY_CARE_PROVIDER_SITE_OTHER): Payer: Medicare PPO | Admitting: Internal Medicine

## 2015-03-30 ENCOUNTER — Encounter: Payer: Self-pay | Admitting: Internal Medicine

## 2015-03-30 VITALS — BP 126/60 | HR 54 | Temp 98.2°F | Resp 18 | Ht 71.0 in | Wt 214.0 lb

## 2015-03-30 DIAGNOSIS — E1022 Type 1 diabetes mellitus with diabetic chronic kidney disease: Secondary | ICD-10-CM | POA: Diagnosis not present

## 2015-03-30 DIAGNOSIS — Z79899 Other long term (current) drug therapy: Secondary | ICD-10-CM | POA: Diagnosis not present

## 2015-03-30 DIAGNOSIS — E559 Vitamin D deficiency, unspecified: Secondary | ICD-10-CM

## 2015-03-30 DIAGNOSIS — Z23 Encounter for immunization: Secondary | ICD-10-CM

## 2015-03-30 DIAGNOSIS — N189 Chronic kidney disease, unspecified: Secondary | ICD-10-CM | POA: Diagnosis not present

## 2015-03-30 DIAGNOSIS — D631 Anemia in chronic kidney disease: Secondary | ICD-10-CM

## 2015-03-30 DIAGNOSIS — N183 Chronic kidney disease, stage 3 (moderate): Secondary | ICD-10-CM | POA: Diagnosis not present

## 2015-03-30 DIAGNOSIS — I150 Renovascular hypertension: Secondary | ICD-10-CM

## 2015-03-30 DIAGNOSIS — E782 Mixed hyperlipidemia: Secondary | ICD-10-CM

## 2015-03-30 LAB — CBC WITH DIFFERENTIAL/PLATELET
BASOS PCT: 1 % (ref 0–1)
Basophils Absolute: 0.1 10*3/uL (ref 0.0–0.1)
EOS ABS: 0.2 10*3/uL (ref 0.0–0.7)
EOS PCT: 3 % (ref 0–5)
HCT: 38.2 % — ABNORMAL LOW (ref 39.0–52.0)
Hemoglobin: 12.8 g/dL — ABNORMAL LOW (ref 13.0–17.0)
LYMPHS ABS: 2.4 10*3/uL (ref 0.7–4.0)
Lymphocytes Relative: 41 % (ref 12–46)
MCH: 29.4 pg (ref 26.0–34.0)
MCHC: 33.5 g/dL (ref 30.0–36.0)
MCV: 87.6 fL (ref 78.0–100.0)
MONOS PCT: 10 % (ref 3–12)
MPV: 11.6 fL (ref 8.6–12.4)
Monocytes Absolute: 0.6 10*3/uL (ref 0.1–1.0)
Neutro Abs: 2.7 10*3/uL (ref 1.7–7.7)
Neutrophils Relative %: 45 % (ref 43–77)
PLATELETS: 158 10*3/uL (ref 150–400)
RBC: 4.36 MIL/uL (ref 4.22–5.81)
RDW: 13.7 % (ref 11.5–15.5)
WBC: 5.9 10*3/uL (ref 4.0–10.5)

## 2015-03-30 LAB — HEMOGLOBIN A1C
HEMOGLOBIN A1C: 8 % — AB (ref <5.7)
Mean Plasma Glucose: 183 mg/dL — ABNORMAL HIGH (ref <117)

## 2015-03-30 NOTE — Progress Notes (Signed)
Patient ID: Gregory Hatfield, male   DOB: 17-Nov-1925, 81 y.o.   MRN: [id number]  Assessment and Plan:  Hypertension:  -Continue medication,  -monitor blood pressure at home.  -Continue DASH diet.   -Reminder to go to the ER if any CP, SOB, nausea, dizziness, severe HA, changes vision/speech, left arm numbness and tingling, and jaw pain.  Cholesterol: -Continue diet and exercise.  -Check cholesterol.   Diabetes: -followed by endocrine -review of blood sugar logs reveal good control -Continue diet and exercise.  -Check A1C  Vitamin D Def: -check level -continue medications.   Continue diet and meds as discussed. Further disposition pending results of labs.  HPI 81 y.o. male  presents for 3 month follow up with hypertension, hyperlipidemia, prediabetes and vitamin D.   His blood pressure has been controlled at home, today their BP is BP: 126/60 mmHg.   He does workout. He denies chest pain, shortness of breath, dizziness.  He reports that he has been very busy.  He has been biking inside.     He is on cholesterol medication and denies myalgias. His cholesterol is at goal. The cholesterol last visit was:   Lab Results  Component Value Date   CHOL 134 12/18/2014   HDL 43 12/18/2014   LDLCALC 74 12/18/2014   TRIG 83 12/18/2014   CHOLHDL 3.1 12/18/2014     He has been working on diet and exercise for prediabetes, and denies foot ulcerations, hyperglycemia, hypoglycemia , increased appetite, nausea, paresthesia of the feet, polydipsia, polyuria, visual disturbances, vomiting and weight loss. Last A1C in the office was:  Lab Results  Component Value Date   HGBA1C 7.6 03/02/2015    Patient is on Vitamin D supplement.  Lab Results  Component Value Date   VD25OH 54 12/18/2014      Current Medications:  Current Outpatient Prescriptions on File Prior to Visit  Medication Sig Dispense Refill  . B-D UF III MINI PEN NEEDLES 31G X 5 MM MISC USE FOR INJECTIONS AS DIRECTED 100 each  2  . carvedilol (COREG) 12.5 MG tablet TAKE  (1)  TABLET TWICE A DAY FOR HIGH BLOOD PRESSURE. 60 tablet 2  . Cholecalciferol (VITAMIN D3) 5000 UNITS CAPS Take 1 capsule by mouth daily.     Marland Kitchen diltiazem (CARDIZEM CD) 240 MG 24 hr capsule TAKE (1) CAPSULE DAILY FOR HIGH BLOOD PRESSURE. 30 capsule 0  . docusate sodium (COLACE) 100 MG capsule Take 1 capsule (100 mg total) by mouth 2 (two) times daily. 10 capsule 0  . furosemide (LASIX) 40 MG tablet TAKE 1 TABLET TWICE DAILY FOR BLOOD PRESSURE AND FLUIDS. 60 tablet 0  . hydrALAZINE (APRESOLINE) 25 MG tablet Take 1 tablet (25 mg total) by mouth 3 (three) times daily. 90 tablet 3  . hyoscyamine (LEVSIN/SL) 0.125 MG SL tablet Place 1 tablet (0.125 mg total) under the tongue every 4 (four) hours as needed. 30 tablet 0  . Insulin Detemir (LEVEMIR FLEXTOUCH) 100 UNIT/ML Pen Inject 48 Units into the skin every morning.    Marland Kitchen LEVEMIR FLEXTOUCH 100 UNIT/ML Pen INJECT 0.5ML (50 UNITS) INTO THE SKIN DAILY 15 mL 0  . losartan (COZAAR) 50 MG tablet Take 50 mg by mouth daily.    Marland Kitchen LUMIGAN 0.01 % SOLN Place 1 drop into the right eye at bedtime.     . Multiple Vitamin (MULTIVITAMIN) tablet Take 1 tablet by mouth daily.      Marland Kitchen MYRBETRIQ 50 MG TB24 tablet     . pravastatin (  PRAVACHOL) 40 MG tablet TAKE 1 TABLET ONCE DAILY. 90 tablet 2  . Probiotic Product (PROBIOTIC DAILY PO) Take 1 tablet by mouth daily.     Marland Kitchen SIMBRINZA 1-0.2 % SUSP Place 1 drop into both eyes 3 (three) times daily.    . TRUE METRIX BLOOD GLUCOSE TEST test strip CHECK BLOOD SUGAR 6 TIMES A DAY. 200 each 2   No current facility-administered medications on file prior to visit.    Medical History:  Past Medical History  Diagnosis Date  . Diabetes mellitus     insulin-dependent  . CHF (congestive heart failure) ([place])   . GERD (gastroesophageal reflux disease)   . Hypertension   . Dyslipidemia   . Glaucoma   . CVA (cerebral infarction)     Left Eye  . Diabetic retinopathy   . CKD (chronic  kidney disease)   . BPH (benign prostatic hyperplasia)   . History of nuclear stress test 03/2004    inf wall scar from base to apex & anterolateral wall ischemia  . Hyperlipidemia   . Vitamin D deficiency   . Bronchitis     hx of   . Arthritis     Allergies:  Allergies  Allergen Reactions  . Ace Inhibitors Other (See Comments) and Cough    Chronic cough  . Atenolol     Atenolol eye drops  . Demerol Other (See Comments)    Hard to wake up  . Flomax [Tamsulosin Hcl] Other (See Comments)    Low BP  . Morphine And Related Other (See Comments)    hallucinations  . Oxytrol [Oxybutynin] Rash     Review of Systems:  Review of Systems  Constitutional: Negative for fever, chills and malaise/fatigue.  HENT: Negative for ear pain.   Respiratory: Negative for cough, shortness of breath and wheezing.   Cardiovascular: Negative for chest pain and palpitations.  Gastrointestinal: Negative for heartburn, diarrhea, constipation, blood in stool and melena.  Genitourinary: Negative.   Skin: Negative.   Neurological: Negative for dizziness, sensory change, loss of consciousness and headaches.  Psychiatric/Behavioral: Negative for depression. The patient is not nervous/anxious and does not have insomnia.     Family history- Review and unchanged  Social history- Review and unchanged  Physical Exam: BP 126/60 mmHg  Pulse 54  Temp(Src) 98.2 F (36.8 C) (Temporal)  Resp 18  Ht 5\' 11"  (1.803 m)  Wt 214 lb (97.07 kg)  BMI 29.86 kg/m2 Wt Readings from Last 3 Encounters:  03/30/15 214 lb (97.07 kg)  03/02/15 212 lb (96.163 kg)  12/18/14 215 lb (97.523 kg)    General Appearance: Well nourished well developed, in no apparent distress. Eyes: PERRLA, EOMs, conjunctiva no swelling or erythema ENT/Mouth: Ear canals normal without obstruction, swelling, erythma, discharge.  TMs normal bilaterally.  Oropharynx moist, clear, without exudate, or postoropharyngeal swelling. Neck: Supple,  thyroid normal,no cervical adenopathy  Respiratory: Respiratory effort normal, Breath sounds clear A&P without rhonchi, wheeze, or rale.  No retractions, no accessory usage. Cardio: RRR with no MRGs. Brisk peripheral pulses without edema.  Abdomen: Soft, + BS,  Non tender, no guarding, rebound, hernias, masses. Musculoskeletal: Full ROM, 5/5 strength, Normal gait Skin: Warm, dry without rashes, lesions, ecchymosis.  Neuro: Awake and oriented X 3, Cranial nerves intact. Normal muscle tone, no cerebellar symptoms. Psych: Normal affect, Insight and Judgment appropriate.    Starlyn Skeans, PA-C 4:12 PM [facility]

## 2015-03-31 LAB — BASIC METABOLIC PANEL WITH GFR
BUN: 21 mg/dL (ref 7–25)
CHLORIDE: 106 mmol/L (ref 98–110)
CO2: 28 mmol/L (ref 20–31)
Calcium: 9.3 mg/dL (ref 8.6–10.3)
Creat: 1.38 mg/dL — ABNORMAL HIGH (ref 0.70–1.11)
GFR, Est African American: 53 mL/min — ABNORMAL LOW (ref >=60)
GFR, Est Non African American: 46 mL/min — ABNORMAL LOW (ref >=60)
GLUCOSE: 117 mg/dL — AB (ref 65–99)
Potassium: 3.5 mmol/L (ref 3.5–5.3)
SODIUM: 143 mmol/L (ref 135–146)

## 2015-03-31 LAB — HEPATIC FUNCTION PANEL
ALK PHOS: 66 U/L (ref 40–115)
ALT: 23 U/L (ref 9–46)
AST: 21 U/L (ref 10–35)
Albumin: 4 g/dL (ref 3.6–5.1)
BILIRUBIN DIRECT: 0.1 mg/dL (ref <=0.2)
BILIRUBIN INDIRECT: 0.4 mg/dL (ref 0.2–1.2)
TOTAL PROTEIN: 6.8 g/dL (ref 6.1–8.1)
Total Bilirubin: 0.5 mg/dL (ref 0.2–1.2)

## 2015-03-31 LAB — LIPID PANEL
Cholesterol: 127 mg/dL (ref 125–200)
HDL: 46 mg/dL (ref >=40)
LDL CALC: 66 mg/dL (ref <130)
Total CHOL/HDL Ratio: 2.8 Ratio (ref <=5.0)
Triglycerides: 75 mg/dL (ref <150)
VLDL: 15 mg/dL (ref <30)

## 2015-04-02 ENCOUNTER — Other Ambulatory Visit: Payer: Self-pay | Admitting: Endocrinology

## 2015-04-10 ENCOUNTER — Other Ambulatory Visit: Payer: Self-pay | Admitting: Endocrinology

## 2015-05-01 ENCOUNTER — Other Ambulatory Visit: Payer: Self-pay | Admitting: Internal Medicine

## 2015-05-01 ENCOUNTER — Other Ambulatory Visit: Payer: Self-pay | Admitting: Physician Assistant

## 2015-05-04 ENCOUNTER — Other Ambulatory Visit: Payer: Self-pay | Admitting: Endocrinology

## 2015-05-05 ENCOUNTER — Inpatient Hospital Stay (HOSPITAL_COMMUNITY)
Admission: EM | Admit: 2015-05-05 | Discharge: 2015-05-09 | DRG: 071 | Disposition: A | Discharge status: Skilled Nursing Facility | Payer: Medicare PPO | Attending: Internal Medicine | Admitting: Internal Medicine

## 2015-05-05 ENCOUNTER — Other Ambulatory Visit (HOSPITAL_COMMUNITY): Payer: Medicare PPO

## 2015-05-05 ENCOUNTER — Encounter (HOSPITAL_COMMUNITY): Payer: Self-pay | Admitting: Radiology

## 2015-05-05 ENCOUNTER — Emergency Department (HOSPITAL_COMMUNITY): Payer: Medicare PPO

## 2015-05-05 DIAGNOSIS — H409 Unspecified glaucoma: Secondary | ICD-10-CM | POA: Diagnosis present

## 2015-05-05 DIAGNOSIS — E1165 Type 2 diabetes mellitus with hyperglycemia: Secondary | ICD-10-CM | POA: Diagnosis present

## 2015-05-05 DIAGNOSIS — R531 Weakness: Secondary | ICD-10-CM

## 2015-05-05 DIAGNOSIS — G9340 Encephalopathy, unspecified: Secondary | ICD-10-CM | POA: Diagnosis present

## 2015-05-05 DIAGNOSIS — M1990 Unspecified osteoarthritis, unspecified site: Secondary | ICD-10-CM | POA: Diagnosis present

## 2015-05-05 DIAGNOSIS — E876 Hypokalemia: Secondary | ICD-10-CM | POA: Diagnosis present

## 2015-05-05 DIAGNOSIS — N179 Acute kidney failure, unspecified: Secondary | ICD-10-CM | POA: Diagnosis present

## 2015-05-05 DIAGNOSIS — N390 Urinary tract infection, site not specified: Secondary | ICD-10-CM | POA: Diagnosis present

## 2015-05-05 DIAGNOSIS — Z79899 Other long term (current) drug therapy: Secondary | ICD-10-CM | POA: Diagnosis not present

## 2015-05-05 DIAGNOSIS — N184 Chronic kidney disease, stage 4 (severe): Secondary | ICD-10-CM | POA: Diagnosis present

## 2015-05-05 DIAGNOSIS — Z794 Long term (current) use of insulin: Secondary | ICD-10-CM | POA: Diagnosis not present

## 2015-05-05 DIAGNOSIS — K5900 Constipation, unspecified: Secondary | ICD-10-CM | POA: Diagnosis not present

## 2015-05-05 DIAGNOSIS — I10 Essential (primary) hypertension: Secondary | ICD-10-CM

## 2015-05-05 DIAGNOSIS — E785 Hyperlipidemia, unspecified: Secondary | ICD-10-CM | POA: Diagnosis present

## 2015-05-05 DIAGNOSIS — Z8249 Family history of ischemic heart disease and other diseases of the circulatory system: Secondary | ICD-10-CM

## 2015-05-05 DIAGNOSIS — Z8673 Personal history of transient ischemic attack (TIA), and cerebral infarction without residual deficits: Secondary | ICD-10-CM

## 2015-05-05 DIAGNOSIS — F0280 Dementia in other diseases classified elsewhere without behavioral disturbance: Secondary | ICD-10-CM | POA: Diagnosis present

## 2015-05-05 DIAGNOSIS — J40 Bronchitis, not specified as acute or chronic: Secondary | ICD-10-CM | POA: Diagnosis present

## 2015-05-05 DIAGNOSIS — Z8042 Family history of malignant neoplasm of prostate: Secondary | ICD-10-CM | POA: Diagnosis not present

## 2015-05-05 DIAGNOSIS — Z888 Allergy status to other drugs, medicaments and biological substances status: Secondary | ICD-10-CM | POA: Diagnosis not present

## 2015-05-05 DIAGNOSIS — E1122 Type 2 diabetes mellitus with diabetic chronic kidney disease: Secondary | ICD-10-CM | POA: Diagnosis present

## 2015-05-05 DIAGNOSIS — D631 Anemia in chronic kidney disease: Secondary | ICD-10-CM | POA: Diagnosis present

## 2015-05-05 DIAGNOSIS — I509 Heart failure, unspecified: Secondary | ICD-10-CM | POA: Diagnosis present

## 2015-05-05 DIAGNOSIS — E1169 Type 2 diabetes mellitus with other specified complication: Secondary | ICD-10-CM | POA: Diagnosis not present

## 2015-05-05 DIAGNOSIS — Z885 Allergy status to narcotic agent status: Secondary | ICD-10-CM | POA: Diagnosis not present

## 2015-05-05 DIAGNOSIS — R262 Difficulty in walking, not elsewhere classified: Secondary | ICD-10-CM | POA: Diagnosis present

## 2015-05-05 DIAGNOSIS — G309 Alzheimer's disease, unspecified: Secondary | ICD-10-CM | POA: Diagnosis present

## 2015-05-05 DIAGNOSIS — Z833 Family history of diabetes mellitus: Secondary | ICD-10-CM

## 2015-05-05 DIAGNOSIS — E559 Vitamin D deficiency, unspecified: Secondary | ICD-10-CM | POA: Diagnosis present

## 2015-05-05 DIAGNOSIS — W1839XA Other fall on same level, initial encounter: Secondary | ICD-10-CM | POA: Diagnosis present

## 2015-05-05 DIAGNOSIS — R140 Abdominal distension (gaseous): Secondary | ICD-10-CM

## 2015-05-05 DIAGNOSIS — Z87891 Personal history of nicotine dependence: Secondary | ICD-10-CM | POA: Diagnosis not present

## 2015-05-05 DIAGNOSIS — J9811 Atelectasis: Secondary | ICD-10-CM | POA: Diagnosis present

## 2015-05-05 DIAGNOSIS — Z96642 Presence of left artificial hip joint: Secondary | ICD-10-CM | POA: Diagnosis present

## 2015-05-05 DIAGNOSIS — E119 Type 2 diabetes mellitus without complications: Secondary | ICD-10-CM | POA: Diagnosis present

## 2015-05-05 DIAGNOSIS — G308 Other Alzheimer's disease: Secondary | ICD-10-CM | POA: Diagnosis not present

## 2015-05-05 DIAGNOSIS — R627 Adult failure to thrive: Secondary | ICD-10-CM

## 2015-05-05 DIAGNOSIS — I129 Hypertensive chronic kidney disease with stage 1 through stage 4 chronic kidney disease, or unspecified chronic kidney disease: Secondary | ICD-10-CM | POA: Diagnosis present

## 2015-05-05 DIAGNOSIS — W010XXA Fall on same level from slipping, tripping and stumbling without subsequent striking against object, initial encounter: Secondary | ICD-10-CM

## 2015-05-05 DIAGNOSIS — R778 Other specified abnormalities of plasma proteins: Secondary | ICD-10-CM

## 2015-05-05 DIAGNOSIS — E1129 Type 2 diabetes mellitus with other diabetic kidney complication: Secondary | ICD-10-CM

## 2015-05-05 DIAGNOSIS — G301 Alzheimer's disease with late onset: Secondary | ICD-10-CM

## 2015-05-05 DIAGNOSIS — R55 Syncope and collapse: Secondary | ICD-10-CM | POA: Diagnosis not present

## 2015-05-05 DIAGNOSIS — Z7982 Long term (current) use of aspirin: Secondary | ICD-10-CM | POA: Diagnosis not present

## 2015-05-05 DIAGNOSIS — R7989 Other specified abnormal findings of blood chemistry: Secondary | ICD-10-CM

## 2015-05-05 DIAGNOSIS — E11649 Type 2 diabetes mellitus with hypoglycemia without coma: Secondary | ICD-10-CM | POA: Diagnosis present

## 2015-05-05 DIAGNOSIS — W19XXXA Unspecified fall, initial encounter: Secondary | ICD-10-CM

## 2015-05-05 DIAGNOSIS — D72829 Elevated white blood cell count, unspecified: Secondary | ICD-10-CM

## 2015-05-05 DIAGNOSIS — I248 Other forms of acute ischemic heart disease: Secondary | ICD-10-CM | POA: Diagnosis present

## 2015-05-05 DIAGNOSIS — N189 Chronic kidney disease, unspecified: Secondary | ICD-10-CM | POA: Diagnosis not present

## 2015-05-05 DIAGNOSIS — D696 Thrombocytopenia, unspecified: Secondary | ICD-10-CM

## 2015-05-05 DIAGNOSIS — IMO0001 Reserved for inherently not codable concepts without codable children: Secondary | ICD-10-CM

## 2015-05-05 DIAGNOSIS — E11319 Type 2 diabetes mellitus with unspecified diabetic retinopathy without macular edema: Secondary | ICD-10-CM | POA: Diagnosis present

## 2015-05-05 DIAGNOSIS — K219 Gastro-esophageal reflux disease without esophagitis: Secondary | ICD-10-CM | POA: Diagnosis present

## 2015-05-05 DIAGNOSIS — N400 Enlarged prostate without lower urinary tract symptoms: Secondary | ICD-10-CM | POA: Diagnosis present

## 2015-05-05 DIAGNOSIS — C61 Malignant neoplasm of prostate: Secondary | ICD-10-CM

## 2015-05-05 HISTORY — DX: Reserved for inherently not codable concepts without codable children: IMO0001

## 2015-05-05 HISTORY — DX: Essential (primary) hypertension: I10

## 2015-05-05 HISTORY — DX: Hyperlipidemia, unspecified: E11.69

## 2015-05-05 LAB — COMPREHENSIVE METABOLIC PANEL
ALK PHOS: 60 U/L (ref 38–126)
ALT: 51 U/L (ref 17–63)
ALT: 63 U/L (ref 17–63)
AST: 180 U/L — AB (ref 15–41)
AST: 222 U/L — AB (ref 15–41)
Albumin: 3.8 g/dL (ref 3.5–5.0)
Albumin: 3.6 g/dL (ref 3.5–5.0)
Alkaline Phosphatase: 60 U/L (ref 38–126)
Anion gap: 11 (ref 5–15)
Anion gap: 8 (ref 5–15)
BUN: 48 mg/dL — AB (ref 6–20)
BUN: 50 mg/dL — AB (ref 6–20)
CALCIUM: 8.7 mg/dL — AB (ref 8.9–10.3)
CHLORIDE: 106 mmol/L (ref 101–111)
CHLORIDE: 106 mmol/L (ref 101–111)
CO2: 23 mmol/L (ref 22–32)
CO2: 26 mmol/L (ref 22–32)
CREATININE: 3.03 mg/dL — AB (ref 0.61–1.24)
CREATININE: 2.83 mg/dL — AB (ref 0.61–1.24)
Calcium: 9.1 mg/dL (ref 8.9–10.3)
GFR calc Af Amer: 20 mL/min — ABNORMAL LOW (ref >60)
GFR calc Af Amer: 22 mL/min — ABNORMAL LOW (ref >60)
GFR, EST NON AFRICAN AMERICAN: 17 mL/min — AB (ref >60)
GFR, EST NON AFRICAN AMERICAN: 19 mL/min — AB (ref >60)
Glucose, Bld: 45 mg/dL — ABNORMAL LOW (ref 65–99)
Glucose, Bld: 132 mg/dL — ABNORMAL HIGH (ref 65–99)
Potassium: 3.5 mmol/L (ref 3.5–5.1)
Potassium: 3.8 mmol/L (ref 3.5–5.1)
Sodium: 140 mmol/L (ref 135–145)
Sodium: 140 mmol/L (ref 135–145)
Total Bilirubin: 0.8 mg/dL (ref 0.3–1.2)
Total Bilirubin: 1 mg/dL (ref 0.3–1.2)
Total Protein: 7.2 g/dL (ref 6.5–8.1)
Total Protein: 6.9 g/dL (ref 6.5–8.1)

## 2015-05-05 LAB — CBC WITH DIFFERENTIAL/PLATELET
BASOS PCT: 0 %
Basophils Absolute: 0 10*3/uL (ref 0.0–0.1)
EOS ABS: 0 10*3/uL (ref 0.0–0.7)
Eosinophils Relative: 0 %
HEMATOCRIT: 39.5 % (ref 39.0–52.0)
HEMOGLOBIN: 12.8 g/dL — AB (ref 13.0–17.0)
LYMPHS ABS: 1 10*3/uL (ref 0.7–4.0)
LYMPHS PCT: 7 %
MCH: 29.2 pg (ref 26.0–34.0)
MCHC: 32.4 g/dL (ref 30.0–36.0)
MCV: 90.2 fL (ref 78.0–100.0)
MONOS PCT: 7 %
Monocytes Absolute: 1 10*3/uL (ref 0.1–1.0)
NEUTROS ABS: 12.9 10*3/uL — AB (ref 1.7–7.7)
Neutrophils Relative %: 86 %
Platelets: 130 10*3/uL — ABNORMAL LOW (ref 150–400)
RBC: 4.38 MIL/uL (ref 4.22–5.81)
RDW: 14.1 % (ref 11.5–15.5)
WBC Morphology: INCREASED
WBC: 14.9 10*3/uL — ABNORMAL HIGH (ref 4.0–10.5)

## 2015-05-05 LAB — APTT: aPTT: 29 seconds (ref 24–37)

## 2015-05-05 LAB — URINALYSIS, ROUTINE W REFLEX MICROSCOPIC
Bilirubin Urine: NEGATIVE
GLUCOSE, UA: NEGATIVE mg/dL
Ketones, ur: NEGATIVE mg/dL
Nitrite: NEGATIVE
PROTEIN: 100 mg/dL — AB
SPECIFIC GRAVITY, URINE: 1.018 (ref 1.005–1.030)
Urobilinogen, UA: 0.2 mg/dL (ref 0.0–1.0)
pH: 5.5 (ref 5.0–8.0)

## 2015-05-05 LAB — CBC
HEMATOCRIT: 38.8 % — AB (ref 39.0–52.0)
HEMOGLOBIN: 12.8 g/dL — AB (ref 13.0–17.0)
MCH: 29.8 pg (ref 26.0–34.0)
MCHC: 33 g/dL (ref 30.0–36.0)
MCV: 90.2 fL (ref 78.0–100.0)
PLATELETS: 145 10*3/uL — AB (ref 150–400)
RBC: 4.3 MIL/uL (ref 4.22–5.81)
RDW: 14 % (ref 11.5–15.5)
WBC: 16.7 10*3/uL — ABNORMAL HIGH (ref 4.0–10.5)

## 2015-05-05 LAB — CBG MONITORING, ED
Glucose-Capillary: 32 mg/dL — CL (ref 65–99)
Glucose-Capillary: 55 mg/dL — ABNORMAL LOW (ref 65–99)
Glucose-Capillary: 166 mg/dL — ABNORMAL HIGH (ref 65–99)

## 2015-05-05 LAB — GLUCOSE, CAPILLARY
GLUCOSE-CAPILLARY: 137 mg/dL — AB (ref 65–99)
Glucose-Capillary: 163 mg/dL — ABNORMAL HIGH (ref 65–99)
Glucose-Capillary: 164 mg/dL — ABNORMAL HIGH (ref 65–99)

## 2015-05-05 LAB — URINE MICROSCOPIC-ADD ON

## 2015-05-05 LAB — TSH: TSH: 0.712 u[IU]/mL (ref 0.350–4.500)

## 2015-05-05 LAB — TROPONIN I
TROPONIN I: 0.11 ng/mL — AB (ref <0.031)
TROPONIN I: 0.12 ng/mL — AB (ref <0.031)
Troponin I: 0.11 ng/mL — ABNORMAL HIGH (ref <0.031)

## 2015-05-05 LAB — MAGNESIUM: MAGNESIUM: 1.9 mg/dL (ref 1.7–2.4)

## 2015-05-05 LAB — MRSA PCR SCREENING: MRSA BY PCR: NEGATIVE

## 2015-05-05 LAB — PROTIME-INR
INR: 1.58 — ABNORMAL HIGH (ref 0.00–1.49)
Prothrombin Time: 18.9 seconds — ABNORMAL HIGH (ref 11.6–15.2)

## 2015-05-05 LAB — PHOSPHORUS: Phosphorus: 3.4 mg/dL (ref 2.5–4.6)

## 2015-05-05 LAB — CK
CK TOTAL: 7460 U/L — AB (ref 49–397)
Total CK: 8727 U/L — ABNORMAL HIGH (ref 49–397)

## 2015-05-05 MED ORDER — PROBIOTIC DAILY PO CAPS: ORAL_CAPSULE | Freq: Every day | ORAL | Status: DC

## 2015-05-05 MED ORDER — INSULIN ASPART 100 UNIT/ML ~~LOC~~ SOLN
0.0000 [IU] | Freq: Three times a day (TID) | SUBCUTANEOUS | Status: DC
  Administered 2015-05-05 – 2015-05-06 (×4): 3 [IU] via SUBCUTANEOUS
  Administered 2015-05-07: 2 [IU] via SUBCUTANEOUS
  Administered 2015-05-07: 5 [IU] via SUBCUTANEOUS
  Administered 2015-05-07: 7 [IU] via SUBCUTANEOUS
  Administered 2015-05-08: 1 [IU] via SUBCUTANEOUS
  Administered 2015-05-08: 3 [IU] via SUBCUTANEOUS
  Administered 2015-05-08: 2 [IU] via SUBCUTANEOUS
  Administered 2015-05-09: 1 [IU] via SUBCUTANEOUS

## 2015-05-05 MED ORDER — PRAVASTATIN SODIUM 40 MG PO TABS
40.0000 mg | ORAL_TABLET | Freq: Every day | ORAL | Status: DC
  Administered 2015-05-05 – 2015-05-09 (×5): 40 mg via ORAL
  Filled 2015-05-05 (×5): qty 1

## 2015-05-05 MED ORDER — ONDANSETRON HCL 4 MG/2ML IJ SOLN: 4.0000 mg | Freq: Four times a day (QID) | INTRAMUSCULAR | Status: DC | PRN

## 2015-05-05 MED ORDER — SODIUM CHLORIDE 0.9 % IV BOLUS (SEPSIS)
500.0000 mL | Freq: Once | INTRAVENOUS | Status: AC
  Administered 2015-05-05: 500 mL via INTRAVENOUS

## 2015-05-05 MED ORDER — DEXTROSE 50 % IV SOLN
25.0000 g | Freq: Once | INTRAVENOUS | Status: AC
  Administered 2015-05-05: 25 g via INTRAVENOUS
  Filled 2015-05-05: qty 50

## 2015-05-05 MED ORDER — ACETAMINOPHEN 650 MG RE SUPP: 650.0000 mg | Freq: Four times a day (QID) | RECTAL | Status: DC | PRN

## 2015-05-05 MED ORDER — ONDANSETRON HCL 4 MG PO TABS
4.0000 mg | ORAL_TABLET | Freq: Four times a day (QID) | ORAL | Status: DC | PRN
Start: 2015-05-05 — End: 2015-05-09

## 2015-05-05 MED ORDER — ADULT MULTIVITAMIN W/MINERALS CH
1.0000 | ORAL_TABLET | Freq: Every day | ORAL | Status: DC
  Administered 2015-05-05 – 2015-05-09 (×5): 1 via ORAL
  Filled 2015-05-05 (×9): qty 1

## 2015-05-05 MED ORDER — CARVEDILOL 12.5 MG PO TABS
12.5000 mg | ORAL_TABLET | Freq: Two times a day (BID) | ORAL | Status: DC
  Administered 2015-05-05 – 2015-05-09 (×8): 12.5 mg via ORAL
  Filled 2015-05-05 (×7): qty 1

## 2015-05-05 MED ORDER — HYDRALAZINE HCL 25 MG PO TABS
25.0000 mg | ORAL_TABLET | Freq: Three times a day (TID) | ORAL | Status: DC
  Administered 2015-05-05 – 2015-05-09 (×12): 25 mg via ORAL
  Filled 2015-05-05 (×12): qty 1

## 2015-05-05 MED ORDER — ASPIRIN 81 MG PO CHEW
81.0000 mg | CHEWABLE_TABLET | Freq: Every day | ORAL | Status: DC
  Administered 2015-05-05 – 2015-05-09 (×5): 81 mg via ORAL
  Filled 2015-05-05 (×5): qty 1

## 2015-05-05 MED ORDER — MIRABEGRON ER 25 MG PO TB24
25.0000 mg | ORAL_TABLET | Freq: Every day | ORAL | Status: DC
  Administered 2015-05-05 – 2015-05-09 (×5): 25 mg via ORAL
  Filled 2015-05-05 (×6): qty 1

## 2015-05-05 MED ORDER — INSULIN ASPART 100 UNIT/ML ~~LOC~~ SOLN
0.0000 [IU] | Freq: Every day | SUBCUTANEOUS | Status: DC
  Administered 2015-05-06: 2 [IU] via SUBCUTANEOUS

## 2015-05-05 MED ORDER — INSULIN DETEMIR 100 UNIT/ML FLEXPEN: 48.0000 [IU] | PEN_INJECTOR | SUBCUTANEOUS | Status: DC

## 2015-05-05 MED ORDER — SODIUM CHLORIDE 0.9 % IV BOLUS (SEPSIS)
1000.0000 mL | Freq: Once | INTRAVENOUS | Status: AC
  Administered 2015-05-05: 1000 mL via INTRAVENOUS

## 2015-05-05 MED ORDER — SACCHAROMYCES BOULARDII 250 MG PO CAPS
250.0000 mg | ORAL_CAPSULE | Freq: Two times a day (BID) | ORAL | Status: DC
  Administered 2015-05-05 – 2015-05-09 (×9): 250 mg via ORAL
  Filled 2015-05-05 (×9): qty 1

## 2015-05-05 MED ORDER — CEFTRIAXONE SODIUM 1 G IJ SOLR
1.0000 g | INTRAMUSCULAR | Status: DC
  Administered 2015-05-05 – 2015-05-08 (×4): 1 g via INTRAVENOUS
  Filled 2015-05-05 (×5): qty 10

## 2015-05-05 MED ORDER — INSULIN DETEMIR 100 UNIT/ML ~~LOC~~ SOLN
24.0000 [IU] | Freq: Every day | SUBCUTANEOUS | Status: DC
  Filled 2015-05-05: qty 0.24

## 2015-05-05 MED ORDER — DILTIAZEM HCL ER COATED BEADS 240 MG PO CP24
240.0000 mg | ORAL_CAPSULE | Freq: Every day | ORAL | Status: DC
  Administered 2015-05-05 – 2015-05-09 (×5): 240 mg via ORAL
  Filled 2015-05-05 (×5): qty 1

## 2015-05-05 MED ORDER — VITAMIN D3 25 MCG (1000 UNIT) PO TABS
5000.0000 [IU] | ORAL_TABLET | Freq: Every day | ORAL | Status: DC
  Administered 2015-05-05 – 2015-05-09 (×5): 5000 [IU] via ORAL
  Filled 2015-05-05 (×11): qty 5

## 2015-05-05 MED ORDER — MIRABEGRON ER 50 MG PO TB24: 50.0000 mg | ORAL_TABLET | Freq: Every day | ORAL | Status: DC

## 2015-05-05 MED ORDER — SODIUM CHLORIDE 0.9 % IJ SOLN
3.0000 mL | Freq: Two times a day (BID) | INTRAMUSCULAR | Status: DC
  Administered 2015-05-06 – 2015-05-09 (×3): 3 mL via INTRAVENOUS

## 2015-05-05 MED ORDER — SODIUM CHLORIDE 0.9 % IV SOLN
INTRAVENOUS | Status: DC
  Administered 2015-05-05 – 2015-05-07 (×5): via INTRAVENOUS

## 2015-05-05 MED ORDER — ACETAMINOPHEN 325 MG PO TABS
650.0000 mg | ORAL_TABLET | Freq: Four times a day (QID) | ORAL | Status: DC | PRN
Start: 2015-05-05 — End: 2015-05-09
  Administered 2015-05-06: 650 mg via ORAL
  Filled 2015-05-05: qty 2

## 2015-05-05 MED ORDER — SODIUM CHLORIDE 0.9 % IV SOLN
INTRAVENOUS | Status: DC
  Administered 2015-05-05: 11:00:00 via INTRAVENOUS

## 2015-05-05 MED ORDER — LATANOPROST 0.005 % OP SOLN
1.0000 [drp] | Freq: Every day | OPHTHALMIC | Status: DC
  Administered 2015-05-05 – 2015-05-08 (×4): 1 [drp] via OPHTHALMIC
  Filled 2015-05-05 (×2): qty 2.5

## 2015-05-05 NOTE — ED Notes (Signed)
Per RN, pt given 8oz. Of orange juice, tolerated well.

## 2015-05-05 NOTE — ED Notes (Signed)
RN notified of CBG, pt to CT , meal given upon return

## 2015-05-05 NOTE — ED Notes (Signed)
Bed: [id number] Expected date:  Expected time:  Means of arrival:  Comments: EMS 73M fall

## 2015-05-05 NOTE — ED Notes (Signed)
Pt given meal tray.

## 2015-05-05 NOTE — ED Provider Notes (Signed)
CSN: [id number]     Arrival date & time 05/05/15  [id number] History   First MD Initiated Contact with Patient 05/05/15 [phone]     Chief Complaint  Patient presents with  . Fall     (Consider location/radiation/quality/duration/timing/severity/associated sxs/prior Treatment) Patient is a 81 y.o. male presenting with fall. The history is provided by the patient.  Fall Pertinent negatives include no chest pain, no abdominal pain, no headaches and no shortness of breath.  Patient c/o bilateral leg > bilateral arm weakness in the past couple days. Symptoms persistent, constant, and are interfering w ability to walk.  Pt indicates last week 'had cold' but that those symptoms have resolved. 2 days ago, drove self to church, but states he was having trouble walking.  Pt indicates he had to 'concentrate real hard' to get his legs to move.  Indicates his legs just gave way, causing him to fall.  Pt indicates again yesterday bilateral legs just gave way, trouble walking, had to crawl out of bathroom.  Denies head injury. No headache. No syncope or dizziness. Denies change in speech or vision. No unilateral numbness or weakness. Patient denies neck or back pain. No fever or chills. Normal appetite. No nvd. No wt loss. Denies change in meds.      Past Medical History  Diagnosis Date  . Diabetes mellitus     insulin-dependent  . CHF (congestive heart failure) ([place])   . GERD (gastroesophageal reflux disease)   . Hypertension   . Dyslipidemia   . Glaucoma   . CVA (cerebral infarction)     Left Eye  . Diabetic retinopathy   . CKD (chronic kidney disease)   . BPH (benign prostatic hyperplasia)   . History of nuclear stress test 03/2004    inf wall scar from base to apex & anterolateral wall ischemia  . Hyperlipidemia   . Vitamin D deficiency   . Bronchitis     hx of   . Arthritis    Past Surgical History  Procedure Laterality Date  . Cardiac catheterization  04/2004    normal r-sided pressure,  mild pulm HTN  . Prostate surgery  10/2010    transurethral resection   . Transthoracic echocardiogram  05/2006    EF 60-70%; mild calcif of MV; mild MV regurg; LA mildly dilated  . Carpal tunnel release Right   . Lumbar spine surgery  2003  . Cervical spine surgery  1990's  . Total hip arthroplasty Left 01/17/2014    Procedure: LEFT TOTAL HIP ARTHROPLASTY;  Surgeon: Yvette Rack., MD;  Location: [place];  Service: Orthopedics;  Laterality: Left;  . Transurethral resection of prostate N/A 08/22/2014    Procedure: TRANSURETHRAL RESECTION OF THE PROSTATE (TURP);  Surgeon: Marcia Brash, MD;  Location: [facility];  Service: Urology;  Laterality: N/A;   Family History  Problem Relation Age of Onset  . Diabetes Mother   . Hypertension Mother   . Prostate cancer Father   . Heart disease Father   . Diabetes Brother   . Cancer Brother   . Heart disease Brother   . Diabetes Sister   . Heart disease Sister   . Hyperlipidemia Child   . Hypertension Child   . Diabetes Child   . Heart attack Child   . Cancer Child    Social History  Substance Use Topics  . Smoking status: Former Smoker    Quit date: 06/21/1983  . Smokeless tobacco: Never Used  . Alcohol Use: No  Review of Systems  Constitutional: Negative for fever and chills.  HENT: Negative for sore throat and trouble swallowing.   Eyes: Negative for visual disturbance.  Respiratory: Negative for cough and shortness of breath.   Cardiovascular: Negative for chest pain, palpitations and leg swelling.  Gastrointestinal: Negative for vomiting, abdominal pain, diarrhea and abdominal distention.  Endocrine: Negative for polyuria.  Genitourinary: Negative for dysuria and flank pain.  Musculoskeletal: Negative for back pain and neck pain.  Skin: Negative for rash.  Neurological: Positive for weakness. Negative for dizziness, syncope, speech difficulty and headaches.  Hematological: Does not bruise/bleed easily.  Psychiatric/Behavioral:  Negative for confusion.      Allergies  Ace inhibitors; Atenolol; Demerol; Flomax; Morphine and related; and Oxytrol  Home Medications   Prior to Admission medications   Medication Sig Start Date End Date Taking? Authorizing Provider  B-D UF III MINI PEN NEEDLES 31G X 5 MM MISC USE FOR INJECTIONS AS DIRECTED 04/10/15   Renato Shin, MD  carvedilol (COREG) 12.5 MG tablet TAKE  (1)  TABLET TWICE A DAY FOR HIGH BLOOD PRESSURE. 03/09/15   [name], MD  Cholecalciferol (VITAMIN D3) 5000 UNITS CAPS Take 1 capsule by mouth daily.     Historical Provider, MD  diltiazem (CARDIZEM CD) 240 MG 24 hr capsule TAKE (1) CAPSULE DAILY FOR HIGH BLOOD PRESSURE. 05/01/15   [name], MD  docusate sodium (COLACE) 100 MG capsule Take 1 capsule (100 mg total) by mouth 2 (two) times daily. 01/20/14   Joshua Chadwell, PA-C  furosemide (LASIX) 40 MG tablet TAKE 1 TABLET TWICE DAILY FOR BLOOD PRESSURE AND FLUIDS. 03/23/15   [name], MD  hydrALAZINE (APRESOLINE) 25 MG tablet Take 1 tablet (25 mg total) by mouth 3 (three) times daily. 08/25/14   [name], MD  hydrALAZINE (APRESOLINE) 25 MG tablet TAKE ONE TABLET 3 TIMES A DAY FOR BLOOD PRESSURE. 05/01/15   [name], MD  hyoscyamine (LEVSIN/SL) 0.125 MG SL tablet Place 1 tablet (0.125 mg total) under the tongue every 4 (four) hours as needed. 08/22/14   Carolan Clines, MD  Insulin Detemir (LEVEMIR FLEXTOUCH) 100 UNIT/ML Pen Inject 48 Units into the skin every morning.    Historical Provider, MD  LEVEMIR FLEXTOUCH 100 UNIT/ML Pen INJECT 0.5ML (50 UNITS) INTO THE SKIN DAILY 05/04/15   Renato Shin, MD  losartan (COZAAR) 50 MG tablet TAKE 1 TABLET ONCE DAILY. 05/01/15   [name], MD  LUMIGAN 0.01 % SOLN Place 1 drop into the right eye at bedtime.  03/29/13   Historical Provider, MD  Multiple Vitamin (MULTIVITAMIN) tablet Take 1 tablet by mouth daily.      Historical Provider, MD  MYRBETRIQ 50 MG TB24 tablet  12/10/14   Historical  Provider, MD  pravastatin (PRAVACHOL) 40 MG tablet TAKE 1 TABLET ONCE DAILY. 01/08/15   [name], MD  Probiotic Product (PROBIOTIC DAILY PO) Take 1 tablet by mouth daily.     Historical Provider, MD  SIMBRINZA 1-0.2 % SUSP Place 1 drop into both eyes 3 (three) times daily. 02/28/13   Historical Provider, MD  TRUE METRIX BLOOD GLUCOSE TEST test strip CHECK BLOOD SUGAR 6 TIMES A DAY. 03/12/15   Renato Shin, MD   BP 130/49 mmHg  Pulse 77  Temp(Src) 97.9 F (36.6 C) (Oral)  Resp 16  SpO2 98% Physical Exam  Constitutional: He is oriented to person, place, and time. He appears well-developed and well-nourished. No distress.  HENT:  Head: Atraumatic.  Mouth/Throat: Oropharynx is clear and moist.  Eyes: Conjunctivae and EOM are normal. Pupils are equal, round, and reactive to light. No scleral icterus.  Neck: Neck supple. No tracheal deviation present.  No bruits  Cardiovascular: Normal rate, regular rhythm, normal heart sounds and intact distal pulses.  Exam reveals no gallop and no friction rub.   No murmur heard. Pulmonary/Chest: Effort normal and breath sounds normal. No accessory muscle usage. No respiratory distress.  Abdominal: Soft. Bowel sounds are normal. He exhibits no distension. There is no tenderness.  Genitourinary:  No cva tenderness  Musculoskeletal: Normal range of motion. He exhibits no edema or tenderness.  CTLS spine, non tender, aligned, no step off. Old/healed surgical scar lumbar region.  Good rom bil ext without pain or focal bony tenderness.   Neurological: He is alert and oriented to person, place, and time. No cranial nerve deficit.  Motor intact bil ext. bil legs 4+/5. Is able to stand, can walk w narrow, shuffling gait, small steps, unsteady.   Skin: Skin is warm and dry. No rash noted. He is not diaphoretic.  Dry scaly, flaky skin bilateral lower legs and feet.   Psychiatric: He has a normal mood and affect.  Nursing note and vitals reviewed.   ED  Course  Procedures (including critical care time) Labs Review  Results for orders placed or performed during the hospital encounter of 05/05/15  CBC  Result Value Ref Range   WBC 16.7 (H) 4.0 - 10.5 K/uL   RBC 4.30 4.22 - 5.81 MIL/uL   Hemoglobin 12.8 (L) 13.0 - 17.0 g/dL   HCT 38.8 (L) 39.0 - 52.0 %   MCV 90.2 78.0 - 100.0 fL   MCH 29.8 26.0 - 34.0 pg   MCHC 33.0 30.0 - 36.0 g/dL   RDW 14.0 11.5 - 15.5 %   Platelets 145 (L) 150 - 400 K/uL  Comprehensive metabolic panel  Result Value Ref Range   Sodium 140 135 - 145 mmol/L   Potassium 3.5 3.5 - 5.1 mmol/L   Chloride 106 101 - 111 mmol/L   CO2 23 22 - 32 mmol/L   Glucose, Bld 45 (L) 65 - 99 mg/dL   BUN 48 (H) 6 - 20 mg/dL   Creatinine, Ser 3.03 (H) 0.61 - 1.24 mg/dL   Calcium 9.1 8.9 - 10.3 mg/dL   Total Protein 7.2 6.5 - 8.1 g/dL   Albumin 3.8 3.5 - 5.0 g/dL   AST 180 (H) 15 - 41 U/L   ALT 51 17 - 63 U/L   Alkaline Phosphatase 60 38 - 126 U/L   Total Bilirubin 0.8 0.3 - 1.2 mg/dL   GFR calc non Af Amer 17 (L) >60 mL/min   GFR calc Af Amer 20 (L) >60 mL/min   Anion gap 11 5 - 15  Troponin I  Result Value Ref Range   Troponin I 0.11 (H) <0.031 ng/mL  POC CBG, ED  Result Value Ref Range   Glucose-Capillary 32 (LL) 65 - 99 mg/dL  POC CBG, ED  Result Value Ref Range   Glucose-Capillary 55 (L) 65 - 99 mg/dL   Dg Chest 2 View  05/05/2015  CLINICAL DATA:  Increased weakness causing the patient to fall at home last night, recent URI, persistent cough, congestion, leukocytosis, personal history of diabetes mellitus, CHF, hypertension, stroke, bronchitis, former smoker, initial encounter EXAM: CHEST  2 VIEW COMPARISON:  04/02/2014 FINDINGS: Enlargement of cardiac silhouette. Mildly tortuous thoracic aorta. Mediastinal contours and pulmonary vascularity otherwise normal. RIGHT basilar atelectasis with mild central bronchitic changes.  No definite acute infiltrate, pleural effusion or pneumothorax. No acute osseous findings. Minimal  degenerative changes thoracic spine with degenerative changes of the RIGHT AC joint and LEFT glenohumeral joint. IMPRESSION: Enlargement of cardiac silhouette. Bronchitic changes with RIGHT basilar atelectasis. Electronically Signed   By: Lavonia Dana M.D.   On: 05/05/2015 09:58   Ct Head Wo Contrast  05/05/2015  CLINICAL DATA:  Status post fall the night of 05/04/2015. Initial encounter. EXAM: CT HEAD WITHOUT CONTRAST CT CERVICAL SPINE WITHOUT CONTRAST TECHNIQUE: Multidetector CT imaging of the head and cervical spine was performed following the standard protocol without intravenous contrast. Multiplanar CT image reconstructions of the cervical spine were also generated. COMPARISON:  None. FINDINGS: CT HEAD FINDINGS Chronic microvascular ischemic change and mild cortical atrophy are identified. There is no evidence of acute intracranial abnormality including hemorrhage, infarct, mass lesion, mass effect, midline shift or abnormal extra-axial fluid collection. No hydrocephalus or pneumocephalus. The calvarium is intact. Imaged paranasal sinuses and mastoid air cells are clear. CT CERVICAL SPINE FINDINGS No cervical spine fracture is identified. Autologous fusion of the C3-4 and C4-5 levels is identified. There is trace anterolisthesis C3 on C4 and C4 on C5. Marked loss of disc space height is present at C5-6 and C6-7. Multilevel facet arthropathy is noted. Lung apices are clear. IMPRESSION: No acute abnormality head or cervical spine. Chronic microvascular ischemic change and mild atrophy. Cervical spondylosis. Electronically Signed   By: Inge Rise M.D.   On: 05/05/2015 09:15   Ct Cervical Spine Wo Contrast  05/05/2015  CLINICAL DATA:  Status post fall the night of 05/04/2015. Initial encounter. EXAM: CT HEAD WITHOUT CONTRAST CT CERVICAL SPINE WITHOUT CONTRAST TECHNIQUE: Multidetector CT imaging of the head and cervical spine was performed following the standard protocol without intravenous contrast.  Multiplanar CT image reconstructions of the cervical spine were also generated. COMPARISON:  None. FINDINGS: CT HEAD FINDINGS Chronic microvascular ischemic change and mild cortical atrophy are identified. There is no evidence of acute intracranial abnormality including hemorrhage, infarct, mass lesion, mass effect, midline shift or abnormal extra-axial fluid collection. No hydrocephalus or pneumocephalus. The calvarium is intact. Imaged paranasal sinuses and mastoid air cells are clear. CT CERVICAL SPINE FINDINGS No cervical spine fracture is identified. Autologous fusion of the C3-4 and C4-5 levels is identified. There is trace anterolisthesis C3 on C4 and C4 on C5. Marked loss of disc space height is present at C5-6 and C6-7. Multilevel facet arthropathy is noted. Lung apices are clear. IMPRESSION: No acute abnormality head or cervical spine. Chronic microvascular ischemic change and mild atrophy. Cervical spondylosis. Electronically Signed   By: Inge Rise M.D.   On: 05/05/2015 09:15       I have personally reviewed and evaluated these images and lab results as part of my medical decision-making.   EKG Interpretation   Date/Time:  Tuesday May 05 2015 08:15:11 EST Ventricular Rate:  69 PR Interval:    QRS Duration: 106 QT Interval:  410 QTC Calculation: 439 R Axis:   39 Text Interpretation:  Sinus rhythm Non-specific intra-ventricular  conduction block No significant change since last tracing Confirmed by  Nizar Cutler  MD, Lennette Bihari ([id number]) on 05/05/2015 8:52:29 AM      MDM   Iv ns. Labs. Ct.  Reviewed nursing notes and prior charts for additional history.   Pt with recent falls, c/o bil ext weakness, will image head and c spine.  Glucose very low, d50.   Swallow screen by nursing.  Po fluids, meal.  Pt w AKI on labs.   Ivf.  ua pending.   Giving functional decline, bil leg weakness, gait instability, significant hypoglycemia, AKI - pt felt to require admission -  Hospitalist service paged.   Recheck, glucose improved, pt alert, awake.  Daughter present - had found pt on floor of living room, unclear how long there. Will add ck to labs, ua still pending.  Trop elev, no chest pain.  Will need repeat/f/u during admit.      Lajean Saver, MD 05/05/15 1005

## 2015-05-05 NOTE — H&P (Addendum)
[facility] History and Physical  Gregory Hatfield [id number] DOB: 08-26-1925 DOA: 05/05/2015  Referring physician: ER physician: Dr. Garrison Columbus PCP: Alesia Richards, MD  Chief Complaint: fall  HPI:  81 year-old male with past medical history significant for hypertension, diabetes on insulin, CKD stage 4 (baseline Cr 1.7 in 03/2014), dyslipidemia who presented to [place] long status post fall at home. Because of altered mental status patient is not a good historian and there is no family at bedside to provide details of present illness. Based on review of his chart and information provided by ED physician, patient apparently was in a good state of health and was driving himself to church but has reported difficulty walking over past few days. No loss of consciousness after the fall.  In ED, patient was hemodynamically stable. Blood work was notable for leukocytosis of 16.7, hemoglobin 12.8, platelets 145, creatinine 3.03, troponin level 0.11 and glucose of 45. CK level was 7460. Chest x-ray showed bronchitic changes with right basilar atelectasis. CT head and cervical spine did not show acute intracranial findings. Urinalysis demonstrated leukocytes with many bacteria and too numerous to count white blood cells. Patient was started on empiric Rocephin.  Assessment & Plan    Principal Problem:   Fall /Acute encephalopathy - Likely in the setting of Alzheimer's dementia, fall pt more altered than his baseline  - PT evaluation pending - CT head and cervical spine did not show acute intracranial findings   Active Problems:   Alzheimer's disease without behavioral disturbance - Stable - Order placed for physical therapy evaluation    Acute renal failure superimposed on stage 4 chronic kidney disease ([place]) - Baseline creatinine in October 2015 was 1.7 - Creatinine on this admission 3.03, likely from combination of losartan, Lasix. Those medications were stopped - Continue  IV fluids - Follow-up BMP tomorrow morning    Elevated CK - CK level 7460 on the admission. Likely secondary to fall, trauma. - Continue IV fluids - Continue to follow CK level daily    Anemia in chronic renal disease - Hemoglobin stable at 12.8 - No evidence of bleeding    Elevated troponin - Likely demand ischemia from acute on chronic renal disease - Troponin mildly elevated at 0.11 - We'll cycle cardiac enzymes and check x-ray showed sinus rhythm - The 12 lead EKG  - Obtain 2-D echo    Essential hypertension - Hold losartan and Lasix considering worsening renal function - Continue Hydralazine, Cardizem and Coreg - Blood pressure is 148/74     Uncontrolled insulin-dependent diabetes mellitus with renal manifestation ([place]) - Will continue with sliding scale insulin only considering initial hypoglycemia - A1c in September 2016 is 7.6, slightly above goal #4 diabetic patients    Dyslipidemia associated with type 2 diabetes mellitus (H[facility]) - Continue Pravachol    Urinary tract infection, site not specified / Leukocytosis - Start Rocephin - Follow up urine culture results    DVT prophylaxis:  - SCD's bilaterally   Radiological Exams on Admission: Dg Chest 2 View 05/05/2015  Enlargement of cardiac silhouette. Bronchitic changes with RIGHT basilar atelectasis. Electronically Signed   By: Lavonia Dana M.D.   On: 05/05/2015 09:58   Ct Head Wo Contrast 05/05/2015 No acute abnormality head or cervical spine. Chronic microvascular ischemic change and mild atrophy. Cervical spondylosis. Electronically Signed   By: Inge Rise M.D.   On: 05/05/2015 09:15   Ct Cervical Spine Wo Contrast 05/05/2015   No acute abnormality head or cervical spine.  Chronic microvascular ischemic change and mild atrophy. Cervical spondylosis. Electronically Signed   By: Inge Rise M.D.   On: 05/05/2015 09:15     Code Status: Full Family Communication: Family not at the bedside  Disposition  Plan: Admit for further evaluation, telemetry   Verlan Grotz, Dedra Skeens, MD  [facility] Pager [phone]  Time spent in minutes: 75 minutes  Review of Systems:  Unable to obtain due to patient's AMS  Past Medical History  Diagnosis Date  . Diabetes mellitus     insulin-dependent  . CHF (congestive heart failure) ([place])   . GERD (gastroesophageal reflux disease)   . Hypertension   . Dyslipidemia   . Glaucoma   . CVA (cerebral infarction)     Left Eye  . Diabetic retinopathy   . CKD (chronic kidney disease)   . BPH (benign prostatic hyperplasia)   . History of nuclear stress test 03/2004    inf wall scar from base to apex & anterolateral wall ischemia  . Hyperlipidemia   . Vitamin D deficiency   . Bronchitis     hx of   . Arthritis    Past Surgical History  Procedure Laterality Date  . Cardiac catheterization  04/2004    normal r-sided pressure, mild pulm HTN  . Prostate surgery  10/2010    transurethral resection   . Transthoracic echocardiogram  05/2006    EF 60-70%; mild calcif of MV; mild MV regurg; LA mildly dilated  . Carpal tunnel release Right   . Lumbar spine surgery  2003  . Cervical spine surgery  1990's  . Total hip arthroplasty Left 01/17/2014    Procedure: LEFT TOTAL HIP ARTHROPLASTY;  Surgeon: Yvette Rack., MD;  Location: [facility];  Service: Orthopedics;  Laterality: Left;  . Transurethral resection of prostate N/A 08/22/2014    Procedure: TRANSURETHRAL RESECTION OF THE PROSTATE (TURP);  Surgeon: Marcia Brash, MD;  Location: [facility];  Service: Urology;  Laterality: N/A;   Social History:  reports that he quit smoking about 31 years ago. He has never used smokeless tobacco. He reports that he does not drink alcohol or use illicit drugs.  Allergies  Allergen Reactions  . Ace Inhibitors Other (See Comments) and Cough    Chronic cough  . Atenolol     Atenolol eye drops  . Demerol Other (See Comments)    Hard to wake up  . Flomax [Tamsulosin Hcl] Other (See  Comments)    Low BP  . Morphine And Related Other (See Comments)    hallucinations  . Oxytrol [Oxybutynin] Rash    Family History:  Family History  Problem Relation Age of Onset  . Diabetes Mother   . Hypertension Mother   . Prostate cancer Father   . Heart disease Father   . Diabetes Brother   . Cancer Brother   . Heart disease Brother   . Diabetes Sister   . Heart disease Sister   . Hyperlipidemia Child   . Hypertension Child   . Diabetes Child   . Heart attack Child   . Cancer Child      Prior to Admission medications   Medication Sig Start Date End Date Taking? Authorizing Provider  aspirin 81 MG tablet Take 81 mg by mouth daily.   Yes Historical Provider, MD  B-D UF III MINI PEN NEEDLES 31G X 5 MM MISC USE FOR INJECTIONS AS DIRECTED 04/10/15  Yes Renato Shin, MD  carvedilol (COREG) 12.5 MG tablet TAKE  (1)  TABLET TWICE A DAY FOR HIGH BLOOD PRESSURE. 03/09/15  Yes [name], MD  Cholecalciferol (VITAMIN D3) 5000 UNITS CAPS Take 5,000 Units by mouth daily.    Yes Historical Provider, MD  diltiazem (CARDIZEM CD) 240 MG 24 hr capsule TAKE (1) CAPSULE DAILY FOR HIGH BLOOD PRESSURE. 05/01/15  Yes [name], MD  furosemide (LASIX) 40 MG tablet TAKE 1 TABLET TWICE DAILY FOR BLOOD PRESSURE AND FLUIDS. 03/23/15  Yes [name], MD  hydrALAZINE (APRESOLINE) 25 MG tablet Take 1 tablet (25 mg total) by mouth 3 (three) times daily. 08/25/14  Yes [name], MD  Insulin Detemir (LEVEMIR FLEXTOUCH) 100 UNIT/ML Pen Inject 48 Units into the skin every morning.   Yes Historical Provider, MD  LEVEMIR FLEXTOUCH 100 UNIT/ML Pen INJECT 0.5ML (50 UNITS) INTO THE SKIN DAILY 05/04/15  Yes Renato Shin, MD  losartan (COZAAR) 50 MG tablet TAKE 1 TABLET ONCE DAILY. 05/01/15  Yes [name], MD  LUMIGAN 0.01 % SOLN Place 1 drop into the right eye at bedtime.  03/29/13  Yes Historical Provider, MD  Multiple Vitamin (MULTIVITAMIN) tablet Take 1 tablet by mouth daily.     Yes  Historical Provider, MD  MYRBETRIQ 50 MG TB24 tablet Take 50 mg by mouth daily.  12/10/14  Yes Historical Provider, MD  pravastatin (PRAVACHOL) 40 MG tablet TAKE 1 TABLET ONCE DAILY. 01/08/15  Yes [name], MD  Probiotic Product (PROBIOTIC DAILY PO) Take 1 tablet by mouth daily.    Yes Historical Provider, MD  SIMBRINZA 1-0.2 % SUSP Place 1 drop into both eyes 3 (three) times daily. 02/28/13  Yes Historical Provider, MD  TRUE METRIX BLOOD GLUCOSE TEST test strip CHECK BLOOD SUGAR 6 TIMES A DAY. 03/12/15  Yes Renato Shin, MD  docusate sodium (COLACE) 100 MG capsule Take 1 capsule (100 mg total) by mouth 2 (two) times daily. Patient not taking: Reported on 05/05/2015 01/20/14   Chriss Czar, PA-C  hydrALAZINE (APRESOLINE) 25 MG tablet TAKE ONE TABLET 3 TIMES A DAY FOR BLOOD PRESSURE. Patient not taking: Reported on 05/05/2015 05/01/15   [name], MD  hyoscyamine (LEVSIN/SL) 0.125 MG SL tablet Place 1 tablet (0.125 mg total) under the tongue every 4 (four) hours as needed. Patient not taking: Reported on 05/05/2015 08/22/14   Carolan Clines, MD   Physical Exam: Filed Vitals:   05/05/15 0726 05/05/15 0833 05/05/15 0958  BP: 130/49  149/79  Pulse: 77  79  Temp: 97.9 F (36.6 C) 97.9 F (36.6 C)   TempSrc: Oral    Resp: 16  23  SpO2: 98%  95%    Physical Exam  Constitutional: Appears well-developed and well-nourished. No distress.  HENT: Normocephalic. No tonsillar erythema or exudates Eyes: Conjunctivae are normal. No scleral icterus.  Neck: Normal ROM. Neck supple. No JVD. No tracheal deviation. No thyromegaly.  CVS: RRR, S1/S2 appreciated  Pulmonary: Effort and breath sounds normal, no stridor, rhonchi, wheezes, rales.  Abdominal: Soft. BS +,  no distension, tenderness, rebound or guarding.  Musculoskeletal: No edema and no tenderness.  Lymphadenopathy: No lymphadenopathy noted, cervical, inguinal. Neuro: Alert. No focal neurologic deficits. Skin: Skin is warm and dry.  No rash noted.  No erythema. No pallor.  Psychiatric: Unable to obtain due to altered mental status   Labs on Admission:  Basic Metabolic Panel:  Recent Labs Lab 05/05/15 0750  NA 140  K 3.5  CL 106  CO2 23  GLUCOSE 45*  BUN 48*  CREATININE 3.03*  CALCIUM 9.1   Liver  Function Tests:  Recent Labs Lab 05/05/15 0750  AST 180*  ALT 51  ALKPHOS 60  BILITOT 0.8  PROT 7.2  ALBUMIN 3.8   No results for input(s): LIPASE, AMYLASE in the last 168 hours. No results for input(s): AMMONIA in the last 168 hours. CBC:  Recent Labs Lab 05/05/15 0750  WBC 16.7*  HGB 12.8*  HCT 38.8*  MCV 90.2  PLT 145*   Cardiac Enzymes:  Recent Labs Lab 05/05/15 0750  TROPONINI 0.11*   BNP: Invalid input(s): POCBNP CBG:  Recent Labs Lab 05/05/15 0806 05/05/15 0842  GLUCAP 32* 55*    If 7PM-7AM, please contact night-coverage [email or web address] Password [email or web address] 05/05/2015, 10:19 AM

## 2015-05-05 NOTE — ED Notes (Signed)
Pt states last night he was heading to the bathroom and his legs gave out and he eventually crawled to the living room.  States his legs feel weak as well as his arms.

## 2015-05-05 NOTE — Progress Notes (Signed)
Initial Nutrition Assessment  DOCUMENTATION CODES:   Not applicable  INTERVENTION:  - Continue Regular diet - RD will continue to monitor for needs  NUTRITION DIAGNOSIS:   Altered nutrition lab value related to acute illness as evidenced by other (see comment) (CBGs: 33-166 mg/dL).  GOAL:   Patient will meet greater than or equal to 90% of their needs  MONITOR:   PO intake, Weight trends, Labs, I & O's  REASON FOR ASSESSMENT:   Consult Assessment of nutrition requirement/status  ASSESSMENT:   81 year-old male with past medical history significant for hypertension, diabetes on insulin, CKD stage 4 (baseline Cr 1.7 in 03/2014), dyslipidemia who presented to [facility] long status post fall at home. Because of altered mental status patient is not a good historian and there is no family at bedside to provide details of present illness. Based on review of his chart and information provided by ED physician, patient apparently was in a good state of health and was driving himself to church but has reported difficulty walking over past few days. No loss of consciousness after the fall.  Pt seen for consult. BMI indicates overweight status. Per notes, pt with AMS and is not a good historian. No family/visitors in the room at time of RD visit.   Pt states that he had a good appetite with no recent changes PTA and denies chewing or swallowing issues. He denies recent weight fluctuations. Unable to confirm accuracy related to appetite and chewing/swallowing.   Per char review, pt's weight has been stable x10 months (205-212 lbs). No muscle or fat wasting or edema noted.   No intakes documented since admission. Unsure if pt was meeting needs PTA but he likely was as indicated by stable weight. Medications reviewed. Labs reviewed; CBGs: 32-166 mg/dL, BUN/creatinine elevated, GFR: 20.   Diet Order:  Diet regular Room service appropriate?: Yes; Fluid consistency:: Thin  Skin:  Reviewed, no  issues  Last BM:  11/13  Height:   Ht Readings from Last 1 Encounters:  05/05/15 6' (1.829 m)    Weight:   Wt Readings from Last 1 Encounters:  05/05/15 210 lb 1.6 oz (95.3 kg)    Ideal Body Weight:  80.91 kg (kg)  BMI:  Body mass index is 28.49 kg/(m^2).  Estimated Nutritional Needs:   Kcal:  1450-1650  Protein:  65-75 grams  Fluid:  1.8-2 L/day  EDUCATION NEEDS:   No education needs identified at this time     Jarome Matin, RD, LDN Inpatient Clinical Dietitian Pager # [phone] After hours/weekend pager # [phone]

## 2015-05-05 NOTE — ED Notes (Signed)
Called to give 20 minute "heads-up" for patient transfer to floor.

## 2015-05-05 NOTE — ED Notes (Signed)
Pt is from home.  Daughter found him on the living room floor this am when she came to check on him.  Pt denies pain and is AxO per EMS but daughter wants him evaluated.  VS: 150/70,80, CBG 70

## 2015-05-06 ENCOUNTER — Inpatient Hospital Stay (HOSPITAL_COMMUNITY): Payer: Medicare PPO

## 2015-05-06 DIAGNOSIS — E1165 Type 2 diabetes mellitus with hyperglycemia: Secondary | ICD-10-CM

## 2015-05-06 DIAGNOSIS — N184 Chronic kidney disease, stage 4 (severe): Secondary | ICD-10-CM

## 2015-05-06 DIAGNOSIS — E1129 Type 2 diabetes mellitus with other diabetic kidney complication: Secondary | ICD-10-CM

## 2015-05-06 DIAGNOSIS — Z794 Long term (current) use of insulin: Secondary | ICD-10-CM

## 2015-05-06 DIAGNOSIS — R55 Syncope and collapse: Secondary | ICD-10-CM

## 2015-05-06 DIAGNOSIS — N390 Urinary tract infection, site not specified: Secondary | ICD-10-CM

## 2015-05-06 DIAGNOSIS — R748 Abnormal levels of other serum enzymes: Secondary | ICD-10-CM

## 2015-05-06 DIAGNOSIS — N179 Acute kidney failure, unspecified: Secondary | ICD-10-CM

## 2015-05-06 DIAGNOSIS — F0280 Dementia in other diseases classified elsewhere without behavioral disturbance: Secondary | ICD-10-CM

## 2015-05-06 DIAGNOSIS — I10 Essential (primary) hypertension: Secondary | ICD-10-CM

## 2015-05-06 DIAGNOSIS — E119 Type 2 diabetes mellitus without complications: Secondary | ICD-10-CM

## 2015-05-06 DIAGNOSIS — R7989 Other specified abnormal findings of blood chemistry: Secondary | ICD-10-CM

## 2015-05-06 DIAGNOSIS — G308 Other Alzheimer's disease: Secondary | ICD-10-CM

## 2015-05-06 DIAGNOSIS — E785 Hyperlipidemia, unspecified: Secondary | ICD-10-CM

## 2015-05-06 DIAGNOSIS — N189 Chronic kidney disease, unspecified: Secondary | ICD-10-CM

## 2015-05-06 DIAGNOSIS — D631 Anemia in chronic kidney disease: Secondary | ICD-10-CM

## 2015-05-06 DIAGNOSIS — E1169 Type 2 diabetes mellitus with other specified complication: Secondary | ICD-10-CM

## 2015-05-06 DIAGNOSIS — G9340 Encephalopathy, unspecified: Principal | ICD-10-CM

## 2015-05-06 LAB — COMPREHENSIVE METABOLIC PANEL
ALBUMIN: 3 g/dL — AB (ref 3.5–5.0)
ALK PHOS: 53 U/L (ref 38–126)
ALT: 58 U/L (ref 17–63)
ANION GAP: 10 (ref 5–15)
AST: 156 U/L — ABNORMAL HIGH (ref 15–41)
BUN: 51 mg/dL — ABNORMAL HIGH (ref 6–20)
CALCIUM: 8 mg/dL — AB (ref 8.9–10.3)
CO2: 19 mmol/L — AB (ref 22–32)
Chloride: 109 mmol/L (ref 101–111)
Creatinine, Ser: 2.67 mg/dL — ABNORMAL HIGH (ref 0.61–1.24)
GFR calc non Af Amer: 20 mL/min — ABNORMAL LOW (ref >60)
GFR, EST AFRICAN AMERICAN: 23 mL/min — AB (ref >60)
Glucose, Bld: 164 mg/dL — ABNORMAL HIGH (ref 65–99)
POTASSIUM: 3.4 mmol/L — AB (ref 3.5–5.1)
SODIUM: 138 mmol/L (ref 135–145)
TOTAL PROTEIN: 6 g/dL — AB (ref 6.5–8.1)
Total Bilirubin: 0.7 mg/dL (ref 0.3–1.2)

## 2015-05-06 LAB — CBC
HEMATOCRIT: 36.6 % — AB (ref 39.0–52.0)
HEMOGLOBIN: 11.8 g/dL — AB (ref 13.0–17.0)
MCH: 29.1 pg (ref 26.0–34.0)
MCHC: 32.2 g/dL (ref 30.0–36.0)
MCV: 90.1 fL (ref 78.0–100.0)
Platelets: 122 10*3/uL — ABNORMAL LOW (ref 150–400)
RBC: 4.06 MIL/uL — AB (ref 4.22–5.81)
RDW: 14.2 % (ref 11.5–15.5)
WBC: 12.4 10*3/uL — ABNORMAL HIGH (ref 4.0–10.5)

## 2015-05-06 LAB — GLUCOSE, CAPILLARY
GLUCOSE-CAPILLARY: 207 mg/dL — AB (ref 65–99)
Glucose-Capillary: 245 mg/dL — ABNORMAL HIGH (ref 65–99)
Glucose-Capillary: 236 mg/dL — ABNORMAL HIGH (ref 65–99)
Glucose-Capillary: 244 mg/dL — ABNORMAL HIGH (ref 65–99)
Glucose-Capillary: 225 mg/dL — ABNORMAL HIGH (ref 65–99)

## 2015-05-06 LAB — HEMOGLOBIN A1C
HEMOGLOBIN A1C: 8.2 % — AB (ref 4.8–5.6)
MEAN PLASMA GLUCOSE: 189 mg/dL

## 2015-05-06 LAB — TROPONIN I
TROPONIN I: 0.12 ng/mL — AB (ref <0.031)
TROPONIN I: 0.07 ng/mL — AB (ref <0.031)

## 2015-05-06 LAB — CK: CK TOTAL: 3815 U/L — AB (ref 49–397)

## 2015-05-06 MED ORDER — POTASSIUM CHLORIDE CRYS ER 20 MEQ PO TBCR
40.0000 meq | EXTENDED_RELEASE_TABLET | Freq: Once | ORAL | Status: AC
  Administered 2015-05-06: 40 meq via ORAL
  Filled 2015-05-06: qty 2

## 2015-05-06 MED ORDER — INSULIN DETEMIR 100 UNIT/ML ~~LOC~~ SOLN
25.0000 [IU] | Freq: Every day | SUBCUTANEOUS | Status: DC
  Administered 2015-05-06 – 2015-05-07 (×2): 25 [IU] via SUBCUTANEOUS
  Filled 2015-05-06 (×3): qty 0.25

## 2015-05-06 NOTE — Progress Notes (Signed)
Inpatient Diabetes Program Recommendations  AACE/ADA: New Consensus Statement on Inpatient Glycemic Control (2015)  Target Ranges:  Prepandial:   less than 140 mg/dL      Peak postprandial:   less than 180 mg/dL (1-2 hours)      Critically ill patients:  140 - 180 mg/dL    Results for TRINITY, HUYLER (MRN [id number]) as of 05/06/2015 13:28  Ref. Range 05/06/2015 07:51 05/06/2015 11:50  Glucose-Capillary Latest Ref Range: 65-99 mg/dL 207 (H) 245 (H)     Home DM Meds: Levemir 50 units daily  Current Insulin Orders: Novolog Sensitive SSI (0-9 units) TID AC + HS     MD- CBGs elevated today.  Please consider starting 1/3 of patient's home dose of Levemir-  Levemir 15 units daily     --Will follow patient during hospitalization--  Wyn Quaker RN, MSN, CDE Diabetes Coordinator Inpatient Glycemic Control Team Team Pager: [phone] (8a-5p)

## 2015-05-06 NOTE — Progress Notes (Signed)
[facility] PROGRESS NOTE  Gregory Hatfield [id number] DOB: Dec 05, 1925 DOA: 05/05/2015 PCP: Alesia Richards, MD  Assessment/Plan: #1 acute encephalopathy/fall Likely metabolic in nature secondary to probable UTI in the setting of Alzheimer's dementia. She with some clinical improvement however baseline unknown. Patient has been assessed by PT and recommended a skilled nursing facility. Will check urine cultures. Check blood cultures 2. Chest x-ray was negative for any acute infiltrate however consented for possible bronchitis. Continue empiric IV Rocephin. Follow.  #2 elevated CK/elevated troponins CK on admission was 7460 and likely secondary to fall/trauma. Patient was placed on IV fluids with improvement in CK levels which are currently at 3815. Continue IV fluids. Elevated troponins likely secondary to a demand ischemia secondary to fall as to ponies seem to have plateaued. Cycle a few more troponins to ensure troponin levels are trending down. 2-D echo obtained with normal EF with no wall motion abnormalities. Follow.  #3 probable urinary tract infection Check urine cultures. Continue IV Rocephin.  #4 fever Likely secondary to probable UTI. Check urine cultures. Check blood cultures 2. Continue empiric IV Rocephin. Follow.  #5 hypokalemia Replete.  #6 acute on chronic kidney disease stage III-IV Baseline creatinine 1.4-1.7. Likely a prerenal azotemia in the setting of diuretics and ARB. Diuretics on hold. Creatinine on admission was 3.03. Renal function improved and currently at 2.67. Continue to hold diuretics and ARB. IV fluids. Follow.  #7 anemia of chronic disease Stable. Follow.  #8 hypertension Continue hydralazine, Cardizem, Coreg. Losartan and diuretics on hold secondary to worsening renal function. Follow.  #9 uncontrolled insulin-dependent diabetes mellitus   patient noted to be hypoglycemic initially. Hemoglobin A1c is 8.2 on 05/05/2015. CBGs have  ranged from 164-245.  #10 Alzheimer's dementia without behavioral disturbance Stable.  #11 dyslipidemia Continue statin.  #12 prophylaxis SCDs for DVT prophylaxis.    Code Status: Full Family Communication: Updated patient. No family at bedside. Disposition Plan: Back to assisted living facility when medically stable, afebrile for at least 24 hours, urine cultures have resulted.   Consultants:  None  Procedures:  2-D echo 05/06/2015  CT head/CT C-spine 05/05/2015  Chest x-ray 05/05/2015  Antibiotics:  IV Rocephin 05/05/2015    HPI/Subjective: Patient states she's feeling better. Patient denies any chest pain. No shortness of breath.  Objective: Filed Vitals:   05/06/15 1054  BP: 122/58  Pulse:   Temp:   Resp:     Intake/Output Summary (Last 24 hours) at 05/06/15 1130 Last data filed at 05/06/15 0600  Gross per 24 hour  Intake 1413.75 ml  Output    900 ml  Net 513.75 ml   Filed Weights   05/05/15 1140 05/06/15 0520  Weight: 95.3 kg (210 lb 1.6 oz) 98.2 kg (216 lb 7.9 oz)    Exam:   General:  NAD  Cardiovascular: RRR  Respiratory: Clear to auscultation bilaterally.  Abdomen: Soft, nontender, nondistended, positive bowel sounds.  Musculoskeletal: No clubbing cyanosis or edema.  Data Reviewed: Basic Metabolic Panel:  Recent Labs Lab 05/05/15 0750 05/05/15 1313 05/06/15 0417  NA 140 140 138  K 3.5 3.8 3.4*  CL 106 106 109  CO2 23 26 19*  GLUCOSE 45* 132* 164*  BUN 48* 50* 51*  CREATININE 3.03* 2.83* 2.67*  CALCIUM 9.1 8.7* 8.0*  MG  --  1.9  --   PHOS  --  3.4  --    Liver Function Tests:  Recent Labs Lab 05/05/15 0750 05/05/15 1313 05/06/15 0417  AST 180* 222* 156*  ALT 51 63 58  ALKPHOS 60 60 53  BILITOT 0.8 1.0 0.7  PROT 7.2 6.9 6.0*  ALBUMIN 3.8 3.6 3.0*   No results for input(s): LIPASE, AMYLASE in the last 168 hours. No results for input(s): AMMONIA in the last 168 hours. CBC:  Recent Labs Lab  05/05/15 0750 05/05/15 1313 05/06/15 0417  WBC 16.7* 14.9* 12.4*  NEUTROABS  --  12.9*  --   HGB 12.8* 12.8* 11.8*  HCT 38.8* 39.5 36.6*  MCV 90.2 90.2 90.1  PLT 145* 130* 122*   Cardiac Enzymes:  Recent Labs Lab 05/05/15 0750 05/05/15 1313 05/05/15 1820 05/06/15 05/06/15 0417  CKTOTAL 7460* 8727*  --   --  3815*  TROPONINI 0.11* 0.11* 0.12* 0.12*  --    BNP (last 3 results) No results for input(s): BNP in the last 8760 hours.  ProBNP (last 3 results) No results for input(s): PROBNP in the last 8760 hours.  CBG:  Recent Labs Lab 05/05/15 1053 05/05/15 1314 05/05/15 1721 05/05/15 2040 05/06/15 0751  GLUCAP 166* 137* 163* 164* 207*    Recent Results (from the past 240 hour(s))  MRSA PCR Screening     Status: None   Collection Time: 05/05/15  2:42 PM  Result Value Ref Range Status   MRSA by PCR NEGATIVE NEGATIVE Final    Comment:        The GeneXpert MRSA Assay (FDA approved for NASAL specimens only), is one component of a comprehensive MRSA colonization surveillance program. It is not intended to diagnose MRSA infection nor to guide or monitor treatment for MRSA infections.      Studies: Dg Chest 2 View  05/05/2015  CLINICAL DATA:  Increased weakness causing the patient to fall at home last night, recent URI, persistent cough, congestion, leukocytosis, personal history of diabetes mellitus, CHF, hypertension, stroke, bronchitis, former smoker, initial encounter EXAM: CHEST  2 VIEW COMPARISON:  04/02/2014 FINDINGS: Enlargement of cardiac silhouette. Mildly tortuous thoracic aorta. Mediastinal contours and pulmonary vascularity otherwise normal. RIGHT basilar atelectasis with mild central bronchitic changes. No definite acute infiltrate, pleural effusion or pneumothorax. No acute osseous findings. Minimal degenerative changes thoracic spine with degenerative changes of the RIGHT AC joint and LEFT glenohumeral joint. IMPRESSION: Enlargement of cardiac  silhouette. Bronchitic changes with RIGHT basilar atelectasis. Electronically Signed   By: Lavonia Dana M.D.   On: 05/05/2015 09:58   Ct Head Wo Contrast  05/05/2015  CLINICAL DATA:  Status post fall the night of 05/04/2015. Initial encounter. EXAM: CT HEAD WITHOUT CONTRAST CT CERVICAL SPINE WITHOUT CONTRAST TECHNIQUE: Multidetector CT imaging of the head and cervical spine was performed following the standard protocol without intravenous contrast. Multiplanar CT image reconstructions of the cervical spine were also generated. COMPARISON:  None. FINDINGS: CT HEAD FINDINGS Chronic microvascular ischemic change and mild cortical atrophy are identified. There is no evidence of acute intracranial abnormality including hemorrhage, infarct, mass lesion, mass effect, midline shift or abnormal extra-axial fluid collection. No hydrocephalus or pneumocephalus. The calvarium is intact. Imaged paranasal sinuses and mastoid air cells are clear. CT CERVICAL SPINE FINDINGS No cervical spine fracture is identified. Autologous fusion of the C3-4 and C4-5 levels is identified. There is trace anterolisthesis C3 on C4 and C4 on C5. Marked loss of disc space height is present at C5-6 and C6-7. Multilevel facet arthropathy is noted. Lung apices are clear. IMPRESSION: No acute abnormality head or cervical spine. Chronic microvascular ischemic change and mild atrophy. Cervical spondylosis. Electronically Signed   By: Marcello Moores  Dalessio M.D.   On: 05/05/2015 09:15   Ct Cervical Spine Wo Contrast  05/05/2015  CLINICAL DATA:  Status post fall the night of 05/04/2015. Initial encounter. EXAM: CT HEAD WITHOUT CONTRAST CT CERVICAL SPINE WITHOUT CONTRAST TECHNIQUE: Multidetector CT imaging of the head and cervical spine was performed following the standard protocol without intravenous contrast. Multiplanar CT image reconstructions of the cervical spine were also generated. COMPARISON:  None. FINDINGS: CT HEAD FINDINGS Chronic microvascular  ischemic change and mild cortical atrophy are identified. There is no evidence of acute intracranial abnormality including hemorrhage, infarct, mass lesion, mass effect, midline shift or abnormal extra-axial fluid collection. No hydrocephalus or pneumocephalus. The calvarium is intact. Imaged paranasal sinuses and mastoid air cells are clear. CT CERVICAL SPINE FINDINGS No cervical spine fracture is identified. Autologous fusion of the C3-4 and C4-5 levels is identified. There is trace anterolisthesis C3 on C4 and C4 on C5. Marked loss of disc space height is present at C5-6 and C6-7. Multilevel facet arthropathy is noted. Lung apices are clear. IMPRESSION: No acute abnormality head or cervical spine. Chronic microvascular ischemic change and mild atrophy. Cervical spondylosis. Electronically Signed   By: Inge Rise M.D.   On: 05/05/2015 09:15    Scheduled Meds: . aspirin  81 mg Oral Daily  . carvedilol  12.5 mg Oral BID WC  . cefTRIAXone (ROCEPHIN)  IV  1 g Intravenous Q24H  . cholecalciferol  5,000 Units Oral Daily  . diltiazem  240 mg Oral Daily  . hydrALAZINE  25 mg Oral TID  . insulin aspart  0-5 Units Subcutaneous QHS  . insulin aspart  0-9 Units Subcutaneous TID WC  . latanoprost  1 drop Right Eye QHS  . mirabegron ER  25 mg Oral Daily  . multivitamin with minerals  1 tablet Oral Daily  . pravastatin  40 mg Oral Daily  . saccharomyces boulardii  250 mg Oral BID  . sodium chloride  3 mL Intravenous Q12H   Continuous Infusions: . sodium chloride 75 mL/hr at 05/06/15 0531    Principal Problem:   Acute encephalopathy Active Problems:   Alzheimer's disease   Anemia in chronic renal disease   Elevated troponin   Acute renal failure superimposed on stage 4 chronic kidney disease (HCC)   Elevated CK   Uncontrolled insulin-dependent diabetes mellitus with renal manifestation (HCC)   Dyslipidemia associated with type 2 diabetes mellitus (HCC)   Urinary tract infection, site not  specified   Leukocytosis   Fall   Essential hypertension    Time spent: 12 minutes    THOMPSON,DANIEL M.D. [facility] Pager [phone]. If 7PM-7AM, please contact night-coverage at [email or web address], password [facility] 05/06/2015, 11:30 AM  LOS: 1 day

## 2015-05-06 NOTE — Evaluation (Signed)
Physical Therapy Evaluation Patient Details Name: Gregory Hatfield MRN: [id number] DOB: August 23, 1925 Today's Date: 05/06/2015   History of Present Illness  81 yo male admitted with acute encephalopathy, falls, weaknesss. Hx of DM, CHF, CVA, diabetic neuropathy, CKD, HTN, L THA 2015, TURP 2016.   Clinical Impression  On eval, pt required Min assist for mobility-walked ~70- feet with RW. Dyspnea 2/4. Recommend ST rehab at SNF. Pt is from home alone.     Follow Up Recommendations SNF    Equipment Recommendations  Rolling walker with 5" wheels    Recommendations for Other Services       Precautions / Restrictions Precautions Precautions: Fall Restrictions Weight Bearing Restrictions: No      Mobility  Bed Mobility Overal bed mobility: Needs Assistance Bed Mobility: Supine to Sit     Supine to sit: Min assist;HOB elevated     General bed mobility comments: Assist to scoot to EOB. Utilized bedpad. Increased time. cues for technique.   Transfers Overall transfer level: Needs assistance Equipment used: Rolling walker (2 wheeled) Transfers: Sit to/from Stand Sit to Stand: Min assist;From elevated surface         General transfer comment: assist to rise, stabilize, control descent. vcs safety, technique, hand placement  Ambulation/Gait Ambulation/Gait assistance: Min assist Ambulation Distance (Feet): 70 Feet Assistive device: Rolling walker (2 wheeled) Gait Pattern/deviations: Step-through pattern;Decreased stride length     General Gait Details: assist to stabilize and maneuver safely with walker. Pt tolerated distance fairly well although noted to have dyspnea ~2/4 towards end of distance.   Stairs            Wheelchair Mobility    Modified Rankin (Stroke Patients Only)       Balance Overall balance assessment: Needs assistance         Standing balance support: Bilateral upper extremity supported;During functional activity Standing balance-Leahy  Scale: Poor                               Pertinent Vitals/Pain Pain Assessment: No/denies pain    Home Living Family/patient expects to be discharged to:: Private residence Living Arrangements: Alone   Type of Home: House Home Access: Stairs to enter Entrance Stairs-Rails: Right Entrance Stairs-Number of Steps: 4 Home Layout: One level;Laundry or work area in [place]: Kasandra Knudsen - single point      Prior Function Level of Independence: Needs assistance   Gait / Transfers Assistance Needed: uses cane for ambulation  ADL's / Homemaking Assistance Needed: independent        Hand Dominance        Extremity/Trunk Assessment   Upper Extremity Assessment: Generalized weakness           Lower Extremity Assessment: Generalized weakness      Cervical / Trunk Assessment: Kyphotic  Communication   Communication: No difficulties  Cognition Arousal/Alertness: Awake/alert Behavior During Therapy: WFL for tasks assessed/performed Overall Cognitive Status: Within Functional Limits for tasks assessed                      General Comments      Exercises        Assessment/Plan    PT Assessment Patient needs continued PT services  PT Diagnosis Difficulty walking;Generalized weakness   PT Problem List Decreased strength;Decreased activity tolerance;Decreased balance;Decreased mobility;Decreased knowledge of use of DME  PT Treatment Interventions DME instruction;Gait training;Functional mobility training;Therapeutic activities;Patient/family education;Balance training;Therapeutic exercise  PT Goals (Current goals can be found in the Care Plan section) Acute Rehab PT Goals Patient Stated Goal: none stated PT Goal Formulation: With patient Time For Goal Achievement: 05/20/15 Potential to Achieve Goals: Good    Frequency Min 3X/week   Barriers to discharge        Co-evaluation               End of Session Equipment  Utilized During Treatment: Gait belt Activity Tolerance: Patient tolerated treatment well Patient left: in chair;with call bell/phone within reach;with chair alarm set           Time: [id number] PT Time Calculation (min) (ACUTE ONLY): 22 min   Charges:   PT Evaluation $Initial PT Evaluation Tier I: 1 Procedure     PT G Codes:        Weston Anna, MPT Pager: [phone]

## 2015-05-06 NOTE — Plan of Care (Signed)
Problem: Safety: Goal: Ability to remain free from injury will improve Outcome: Completed/Met Date Met:  05/06/15 Education provided regarding safety measures/ precautions

## 2015-05-06 NOTE — Progress Notes (Signed)
*  PRELIMINARY RESULTS* Echocardiogram 2D Echocardiogram has been performed.  Gregory Hatfield 05/06/2015, 12:39 PM

## 2015-05-06 NOTE — Care Management Note (Signed)
Case Management Note  Patient Details  Name: Gregory Hatfield MRN: [id number] Date of Birth: Nov 24, 1925  Subjective/Objective:                 ams with fall poss bronchitis   Action/Plan:Date: May 06, 2015 Chart reviewed for concurrent status and case management needs. Will continue to follow patient for changes and needs: Velva Harman, RN, BSN, [place]   [phone]   Expected Discharge Date:   (unknown)               Expected Discharge Plan:  [place]  In-House Referral:  Clinical Social Work  Discharge planning Services  CM Consult  Post Acute Care Choice:  NA Choice offered to:  NA  DME Arranged:    DME Agency:     HH Arranged:    [place] Agency:     Status of Service:  In process, will continue to follow  Medicare Important Message Given:    Date Medicare IM Given:    Medicare IM give by:    Date Additional Medicare IM Given:    Additional Medicare Important Message give by:     If discussed at [place] of Stay Meetings, dates discussed:    Additional Comments:  Leeroy Cha, RN 05/06/2015, 9:28 AM

## 2015-05-07 DIAGNOSIS — D72829 Elevated white blood cell count, unspecified: Secondary | ICD-10-CM

## 2015-05-07 DIAGNOSIS — G309 Alzheimer's disease, unspecified: Secondary | ICD-10-CM

## 2015-05-07 LAB — CBC
HEMATOCRIT: 36.6 % — AB (ref 39.0–52.0)
HEMOGLOBIN: 11.9 g/dL — AB (ref 13.0–17.0)
MCH: 29.4 pg (ref 26.0–34.0)
MCHC: 32.5 g/dL (ref 30.0–36.0)
MCV: 90.4 fL (ref 78.0–100.0)
Platelets: 118 10*3/uL — ABNORMAL LOW (ref 150–400)
RBC: 4.05 MIL/uL — ABNORMAL LOW (ref 4.22–5.81)
RDW: 14.4 % (ref 11.5–15.5)
WBC: 13 10*3/uL — ABNORMAL HIGH (ref 4.0–10.5)

## 2015-05-07 LAB — COMPREHENSIVE METABOLIC PANEL
ALT: 60 U/L (ref 17–63)
AST: 87 U/L — ABNORMAL HIGH (ref 15–41)
Albumin: 2.7 g/dL — ABNORMAL LOW (ref 3.5–5.0)
Alkaline Phosphatase: 63 U/L (ref 38–126)
Anion gap: 10 (ref 5–15)
BILIRUBIN TOTAL: 0.8 mg/dL (ref 0.3–1.2)
BUN: 54 mg/dL — ABNORMAL HIGH (ref 6–20)
CHLORIDE: 110 mmol/L (ref 101–111)
CO2: 19 mmol/L — ABNORMAL LOW (ref 22–32)
CREATININE: 2.44 mg/dL — AB (ref 0.61–1.24)
Calcium: 8.2 mg/dL — ABNORMAL LOW (ref 8.9–10.3)
GFR, EST AFRICAN AMERICAN: 26 mL/min — AB (ref >60)
GFR, EST NON AFRICAN AMERICAN: 22 mL/min — AB (ref >60)
Glucose, Bld: 265 mg/dL — ABNORMAL HIGH (ref 65–99)
POTASSIUM: 3.8 mmol/L (ref 3.5–5.1)
Sodium: 139 mmol/L (ref 135–145)
TOTAL PROTEIN: 6.2 g/dL — AB (ref 6.5–8.1)

## 2015-05-07 LAB — TROPONIN I
TROPONIN I: 0.07 ng/mL — AB (ref <0.031)
TROPONIN I: 0.06 ng/mL — AB (ref <0.031)

## 2015-05-07 LAB — MAGNESIUM: Magnesium: 2.1 mg/dL (ref 1.7–2.4)

## 2015-05-07 LAB — CK: Total CK: 1457 U/L — ABNORMAL HIGH (ref 49–397)

## 2015-05-07 LAB — GLUCOSE, CAPILLARY
Glucose-Capillary: 198 mg/dL — ABNORMAL HIGH (ref 65–99)
Glucose-Capillary: 289 mg/dL — ABNORMAL HIGH (ref 65–99)
Glucose-Capillary: 309 mg/dL — ABNORMAL HIGH (ref 65–99)

## 2015-05-07 MED ORDER — SENNOSIDES-DOCUSATE SODIUM 8.6-50 MG PO TABS
1.0000 | ORAL_TABLET | Freq: Every day | ORAL | Status: DC
  Administered 2015-05-07 – 2015-05-08 (×2): 1 via ORAL
  Filled 2015-05-07 (×2): qty 1

## 2015-05-07 MED ORDER — POLYETHYLENE GLYCOL 3350 17 G PO PACK
17.0000 g | PACK | Freq: Every day | ORAL | Status: DC
  Administered 2015-05-08: 17 g via ORAL
  Filled 2015-05-07 (×2): qty 1

## 2015-05-07 MED ORDER — SODIUM BICARBONATE 650 MG PO TABS
650.0000 mg | ORAL_TABLET | Freq: Two times a day (BID) | ORAL | Status: DC
  Administered 2015-05-07 – 2015-05-09 (×5): 650 mg via ORAL
  Filled 2015-05-07 (×5): qty 1

## 2015-05-07 NOTE — Progress Notes (Addendum)
Patient has made several attempts today to have a bowel movement without success. Abdomen is distended and firm. Bowels sounds are active. Pt denied N/V, pain and or discomfort but reports inability to move bowels. M.D. Notified, orders received. Will continue to monitor

## 2015-05-07 NOTE — Progress Notes (Signed)
[facility] PROGRESS NOTE  DERAY KIMBRO [id number] DOB: 02-Jul-1925 DOA: 05/05/2015 PCP: Alesia Richards, MD  Assessment/Plan: #1 acute encephalopathy/fall Likely metabolic in nature secondary to probable UTI in the setting of Alzheimer's dementia. She with some clinical improvement however baseline unknown. Patient has been assessed by PT and recommended a skilled nursing facility. Urine cultures pending. Blood cultures pending. Chest x-ray was negative for any acute infiltrate however consented for possible bronchitis. Continue empiric IV Rocephin. Follow.  #2 elevated CK/elevated troponins CK on admission was 7460 and likely secondary to fall/trauma. Patient was placed on IV fluids with improvement in CK levels which are currently at 1457. Continue IV fluids. Elevated troponins likely secondary to a demand ischemia secondary to fall as to ponies seem to have plateaued. Troponins tended down. 2-D echo obtained with normal EF with no wall motion abnormalities. Follow.  #3 probable urinary tract infection Check urine cultures. Continue IV Rocephin.  #4 fever Likely secondary to probable UTI. Urine cultures pending. Blood cultures pending. Continue empiric IV Rocephin. Follow.  #5 hypokalemia Replete.  #6 acute on chronic kidney disease stage III-IV Baseline creatinine 1.4-1.7. Likely a prerenal azotemia in the setting of diuretics and ARB. Diuretics on hold. Creatinine on admission was 3.03. Renal function improved and currently at 2.44. Continue to hold diuretics and ARB. IV fluids. Follow.  #7 anemia of chronic disease Stable. Follow.  #8 hypertension Continue hydralazine, Cardizem, Coreg. Losartan and diuretics on hold secondary to worsening renal function. Follow.  #9 uncontrolled insulin-dependent diabetes mellitus   patient noted to be hypoglycemic initially. Hemoglobin A1c is 8.2 on 05/05/2015. CBGs have ranged from 198-244. Patient was started back on  Levemir 25 units daily. Continue sliding scale insulin.  #10 Alzheimer's dementia without behavioral disturbance Stable.  #11 dyslipidemia Continue statin.  #12 prophylaxis SCDs for DVT prophylaxis.    Code Status: Full Family Communication: Updated patient. No family at bedside. Disposition Plan: Back to assisted living facility when medically stable, afebrile for at least 24 hours, urine cultures have resulted.   Consultants:  None  Procedures:  2-D echo 05/06/2015  CT head/CT C-spine 05/05/2015  Chest x-ray 05/05/2015  Antibiotics:  IV Rocephin 05/05/2015    HPI/Subjective: Patient states he's feeling better. Patient denies any chest pain. No shortness of breath.  Objective: Filed Vitals:   05/07/15 0800  BP: 142/62  Pulse: 68  Temp:   Resp:     Intake/Output Summary (Last 24 hours) at 05/07/15 0923 Last data filed at 05/07/15 0828  Gross per 24 hour  Intake   1685 ml  Output    950 ml  Net    735 ml   Filed Weights   05/05/15 1140 05/06/15 0520 05/07/15 0558  Weight: 95.3 kg (210 lb 1.6 oz) 98.2 kg (216 lb 7.9 oz) 97.07 kg (214 lb)    Exam:   General:  NAD  Cardiovascular: RRR  Respiratory: Clear to auscultation bilaterally.  Abdomen: Soft, nontender, mildly distended, positive bowel sounds.  Musculoskeletal: No clubbing cyanosis or edema.  Data Reviewed: Basic Metabolic Panel:  Recent Labs Lab 05/05/15 0750 05/05/15 1313 05/06/15 0417 05/07/15 0708  NA 140 140 138 139  K 3.5 3.8 3.4* 3.8  CL 106 106 109 110  CO2 23 26 19* 19*  GLUCOSE 45* 132* 164* 265*  BUN 48* 50* 51* 54*  CREATININE 3.03* 2.83* 2.67* 2.44*  CALCIUM 9.1 8.7* 8.0* 8.2*  MG  --  1.9  --  2.1  PHOS  --  3.4  --   --  Liver Function Tests:  Recent Labs Lab 05/05/15 0750 05/05/15 1313 05/06/15 0417 05/07/15 0708  AST 180* 222* 156* 87*  ALT 51 63 58 60  ALKPHOS 60 60 53 63  BILITOT 0.8 1.0 0.7 0.8  PROT 7.2 6.9 6.0* 6.2*  ALBUMIN 3.8 3.6 3.0*  2.7*   No results for input(s): LIPASE, AMYLASE in the last 168 hours. No results for input(s): AMMONIA in the last 168 hours. CBC:  Recent Labs Lab 05/05/15 0750 05/05/15 1313 05/06/15 0417 05/07/15 0708  WBC 16.7* 14.9* 12.4* 13.0*  NEUTROABS  --  12.9*  --   --   HGB 12.8* 12.8* 11.8* 11.9*  HCT 38.8* 39.5 36.6* 36.6*  MCV 90.2 90.2 90.1 90.4  PLT 145* 130* 122* 118*   Cardiac Enzymes:  Recent Labs Lab 05/05/15 0750 05/05/15 1313 05/05/15 1820 05/06/15 05/06/15 0417 05/06/15 1908 05/07/15 0105 05/07/15 0648 05/07/15 0708  CKTOTAL 7460* 8727*  --   --  3815*  --   --   --  1457*  TROPONINI 0.11* 0.11* 0.12* 0.12*  --  0.07* 0.07* 0.06*  --    BNP (last 3 results) No results for input(s): BNP in the last 8760 hours.  ProBNP (last 3 results) No results for input(s): PROBNP in the last 8760 hours.  CBG:  Recent Labs Lab 05/06/15 1150 05/06/15 1623 05/06/15 2002 05/06/15 2124 05/07/15 0743  GLUCAP 245* 236* 244* 225* 198*    Recent Results (from the past 240 hour(s))  MRSA PCR Screening     Status: None   Collection Time: 05/05/15  2:42 PM  Result Value Ref Range Status   MRSA by PCR NEGATIVE NEGATIVE Final    Comment:        The GeneXpert MRSA Assay (FDA approved for NASAL specimens only), is one component of a comprehensive MRSA colonization surveillance program. It is not intended to diagnose MRSA infection nor to guide or monitor treatment for MRSA infections.      Studies: Dg Chest 2 View  05/05/2015  CLINICAL DATA:  Increased weakness causing the patient to fall at home last night, recent URI, persistent cough, congestion, leukocytosis, personal history of diabetes mellitus, CHF, hypertension, stroke, bronchitis, former smoker, initial encounter EXAM: CHEST  2 VIEW COMPARISON:  04/02/2014 FINDINGS: Enlargement of cardiac silhouette. Mildly tortuous thoracic aorta. Mediastinal contours and pulmonary vascularity otherwise normal. RIGHT  basilar atelectasis with mild central bronchitic changes. No definite acute infiltrate, pleural effusion or pneumothorax. No acute osseous findings. Minimal degenerative changes thoracic spine with degenerative changes of the RIGHT AC joint and LEFT glenohumeral joint. IMPRESSION: Enlargement of cardiac silhouette. Bronchitic changes with RIGHT basilar atelectasis. Electronically Signed   By: Lavonia Dana M.D.   On: 05/05/2015 09:58    Scheduled Meds: . aspirin  81 mg Oral Daily  . carvedilol  12.5 mg Oral BID WC  . cefTRIAXone (ROCEPHIN)  IV  1 g Intravenous Q24H  . cholecalciferol  5,000 Units Oral Daily  . diltiazem  240 mg Oral Daily  . hydrALAZINE  25 mg Oral TID  . insulin aspart  0-5 Units Subcutaneous QHS  . insulin aspart  0-9 Units Subcutaneous TID WC  . insulin detemir  25 Units Subcutaneous Daily  . latanoprost  1 drop Right Eye QHS  . mirabegron ER  25 mg Oral Daily  . multivitamin with minerals  1 tablet Oral Daily  . pravastatin  40 mg Oral Daily  . saccharomyces boulardii  250 mg Oral BID  . sodium  bicarbonate  650 mg Oral BID  . sodium chloride  3 mL Intravenous Q12H   Continuous Infusions: . sodium chloride 75 mL/hr at 05/06/15 2008    Principal Problem:   Acute encephalopathy Active Problems:   Alzheimer's disease   Anemia in chronic renal disease   Elevated troponin   Acute renal failure superimposed on stage 4 chronic kidney disease (HCC)   Elevated CK   Uncontrolled insulin-dependent diabetes mellitus with renal manifestation (HCC)   Dyslipidemia associated with type 2 diabetes mellitus (HCC)   Urinary tract infection, site not specified   Leukocytosis   Fall   Essential hypertension   IDDM (insulin dependent diabetes mellitus) ([place])    Time spent: 90 minutes    THOMPSON,DANIEL M.D. [facility] Pager [phone]. If 7PM-7AM, please contact night-coverage at [email or web address], password [facility] 05/07/2015, 9:23 AM  LOS: 2 days

## 2015-05-07 NOTE — Progress Notes (Signed)
Inpatient Diabetes Program Recommendations  AACE/ADA: New Consensus Statement on Inpatient Glycemic Control (2015)  Target Ranges:  Prepandial:   less than 140 mg/dL      Peak postprandial:   less than 180 mg/dL (1-2 hours)      Critically ill patients:  140 - 180 mg/dL   Review of Glycemic Control  Results for Gregory Hatfield, Gregory Hatfield (MRN [id number]) as of 05/07/2015 12:05  Ref. Range 05/06/2015 16:23 05/06/2015 20:02 05/06/2015 21:24 05/07/2015 07:43 05/07/2015 12:01  Glucose-Capillary Latest Ref Range: 65-99 mg/dL 236 (H) 244 (H) 225 (H) 198 (H) 289 (H)   Needs insulin adjustment.  Inpatient Diabetes Program Recommendations:     Increase Levemir to 35 units QHS.  Will continue to follow. Thank you. Lorenda Peck, RD, LDN, CDE Inpatient Diabetes Coordinator [phone]

## 2015-05-07 NOTE — NC FL2 (Signed)
[place] LEVEL OF CARE SCREENING TOOL     IDENTIFICATION  Patient Name: Gregory Hatfield Birthdate: 04/12/1926 Sex: male Admission Date (Current Location): 05/05/2015  [facility] and [place] Number: Herbalist and Address:  [facility],  [place], [place]      Provider Number: [id number]  Attending Physician Name and Address:  Eugenie Filler, MD  Relative Name and Phone Number:       Current Level of Care: Hospital Recommended Level of Care: [place] Prior Approval Number:    Date Approved/Denied:   PASRR Number:   [id number]   Discharge Plan: SNF    Current Diagnoses: Patient Active Problem List   Diagnosis Date Noted  . Leucocytosis   . IDDM (insulin dependent diabetes mellitus) ([place])   . Elevated troponin 05/05/2015  . Acute renal failure superimposed on stage 4 chronic kidney disease ([place]) 05/05/2015  . Elevated CK 05/05/2015  . Uncontrolled insulin-dependent diabetes mellitus with renal manifestation ([place]) 05/05/2015  . Dyslipidemia associated with type 2 diabetes mellitus ([place]) 05/05/2015  . Urinary tract infection, site not specified 05/05/2015  . Leukocytosis 05/05/2015  . Fall 05/05/2015  . Acute encephalopathy 05/05/2015  . Essential hypertension 05/05/2015  . Anemia in chronic renal disease 02/04/2014  . Alzheimer's disease 06/16/2013    Orientation ACTIVITIES/SOCIAL BLADDER RESPIRATION    Self, Time, Situation, Place  Active Continent Normal  BEHAVIORAL SYMPTOMS/MOOD NEUROLOGICAL BOWEL NUTRITION STATUS      Continent    PHYSICIAN VISITS COMMUNICATION OF NEEDS Height & Weight Skin    Verbally 6' (182.9 cm) 214 lbs. Normal          AMBULATORY STATUS RESPIRATION    Assist extensive Normal      Personal Care Assistance Level of Assistance  Bathing, Dressing            Functional Limitations Info                SPECIAL CARE FACTORS FREQUENCY  PT (By  licensed PT), OT (By licensed OT)                   Additional Factors Info                  Current Medications (05/07/2015): Current Facility-Administered Medications  Medication Dose Route Frequency Provider Last Rate Last Dose  . 0.9 %  sodium chloride infusion   Intravenous Continuous [name], MD 75 mL/hr at 05/06/15 2008    . acetaminophen (TYLENOL) tablet 650 mg  650 mg Oral Q6H PRN [name], MD   650 mg at 05/06/15 2007   Or  . acetaminophen (TYLENOL) suppository 650 mg  650 mg Rectal Q6H PRN [name], MD      . aspirin chewable tablet 81 mg  81 mg Oral Daily [name], MD   81 mg at 05/07/15 1100  . carvedilol (COREG) tablet 12.5 mg  12.5 mg Oral BID WC [name], MD   12.5 mg at 05/07/15 0800  . cefTRIAXone (ROCEPHIN) 1 g in dextrose 5 % 50 mL IVPB  1 g Intravenous Q24H [name], MD   1 g at 05/06/15 1500  . cholecalciferol (VITAMIN D) tablet 5,000 Units  5,000 Units Oral Daily [name], MD   5,000 Units at 05/07/15 1100  . diltiazem (CARDIZEM CD) 24 hr capsule 240 mg  240 mg Oral Daily [name], MD  240 mg at 05/07/15 1100  . hydrALAZINE (APRESOLINE) tablet 25 mg  25 mg Oral TID [name], MD   25 mg at 05/07/15 1100  . insulin aspart (novoLOG) injection 0-5 Units  0-5 Units Subcutaneous QHS [name], MD   2 Units at 05/06/15 2148  . insulin aspart (novoLOG) injection 0-9 Units  0-9 Units Subcutaneous TID WC [name], MD   5 Units at 05/07/15 1213  . insulin detemir (LEVEMIR) injection 25 Units  25 Units Subcutaneous Daily Eugenie Filler, MD   25 Units at 05/07/15 1000  . latanoprost (XALATAN) 0.005 % ophthalmic solution 1 drop  1 drop Right Eye QHS [name], MD   1 drop at 05/06/15 2258  . mirabegron ER (MYRBETRIQ) tablet 25 mg  25 mg Oral Daily [name], MD   25 mg at 05/07/15 1100  . multivitamin with minerals tablet 1 tablet  1 tablet Oral Daily [name], MD   1 tablet at 05/07/15 1100  .  ondansetron (ZOFRAN) tablet 4 mg  4 mg Oral Q6H PRN [name], MD       Or  . ondansetron [facility]) injection 4 mg  4 mg Intravenous Q6H PRN [name], MD      . polyethylene glycol [facility] / GLYCOLAX) packet 17 g  17 g Oral Daily [name], MD      . pravastatin (PRAVACHOL) tablet 40 mg  40 mg Oral Daily [name], MD   40 mg at 05/07/15 1100  . saccharomyces boulardii (FLORASTOR) capsule 250 mg  250 mg Oral BID [name], MD   250 mg at 05/07/15 1100  . senna-docusate (Senokot-S) tablet 1 tablet  1 tablet Oral QHS [name], MD      . sodium bicarbonate tablet 650 mg  650 mg Oral BID Eugenie Filler, MD   650 mg at 05/07/15 1000  . sodium chloride 0.9 % injection 3 mL  3 mL Intravenous Q12H [name], MD   3 mL at 05/06/15 1000   Do not use this list as official medication orders. Please verify with discharge summary.  Discharge Medications:   Medication List    ASK your doctor about these medications        aspirin 81 MG tablet  Take 81 mg by mouth daily.     B-D UF III MINI PEN NEEDLES 31G X 5 MM Misc  Generic drug:  Insulin Pen Needle  USE FOR INJECTIONS AS DIRECTED     carvedilol 12.5 MG tablet  Commonly known as:  COREG  TAKE  (1)  TABLET TWICE A DAY FOR HIGH BLOOD PRESSURE.     diltiazem 240 MG 24 hr capsule  Commonly known as:  CARDIZEM CD  TAKE (1) CAPSULE DAILY FOR HIGH BLOOD PRESSURE.     docusate sodium 100 MG capsule  Commonly known as:  COLACE  Take 1 capsule (100 mg total) by mouth 2 (two) times daily.     furosemide 40 MG tablet  Commonly known as:  LASIX  TAKE 1 TABLET TWICE DAILY FOR BLOOD PRESSURE AND FLUIDS.     hydrALAZINE 25 MG tablet  Commonly known as:  APRESOLINE  Take 1 tablet (25 mg total) by mouth 3 (three) times daily.     hydrALAZINE 25 MG tablet  Commonly known as:  APRESOLINE  TAKE ONE TABLET 3 TIMES A DAY FOR BLOOD PRESSURE.  hyoscyamine 0.125 MG SL tablet  Commonly known as:  LEVSIN/SL   Place 1 tablet (0.125 mg total) under the tongue every 4 (four) hours as needed.     LEVEMIR FLEXTOUCH 100 UNIT/ML Pen  Generic drug:  Insulin Detemir  Inject 48 Units into the skin every morning.     LEVEMIR FLEXTOUCH 100 UNIT/ML Pen  Generic drug:  Insulin Detemir  INJECT 0.5ML (50 UNITS) INTO THE SKIN DAILY     losartan 50 MG tablet  Commonly known as:  COZAAR  TAKE 1 TABLET ONCE DAILY.     LUMIGAN 0.01 % Soln  Generic drug:  bimatoprost  Place 1 drop into the right eye at bedtime.     multivitamin tablet  Take 1 tablet by mouth daily.     MYRBETRIQ 50 MG Tb24 tablet  Generic drug:  mirabegron ER  Take 50 mg by mouth daily.     pravastatin 40 MG tablet  Commonly known as:  PRAVACHOL  TAKE 1 TABLET ONCE DAILY.     PROBIOTIC DAILY PO  Take 1 tablet by mouth daily.     SIMBRINZA 1-0.2 % Susp  Generic drug:  Brinzolamide-Brimonidine  Place 1 drop into both eyes 3 (three) times daily.     TRUE METRIX BLOOD GLUCOSE TEST test strip  Generic drug:  glucose blood  CHECK BLOOD SUGAR 6 TIMES A DAY.     Vitamin D3 5000 UNITS Caps  Take 5,000 Units by mouth daily.        Relevant Imaging Results:  Relevant Lab Results:  Recent Labs    Additional Information SSN [id number]  Ludwig Clarks, LCSW

## 2015-05-07 NOTE — Clinical Social Work Placement (Signed)
   CLINICAL SOCIAL WORK PLACEMENT  NOTE  Date:  05/07/2015  Patient Details  Name: Gregory Hatfield MRN: [id number] Date of Birth: April 10, 1926  Clinical Social Work is seeking post-discharge placement for this patient at the [place] level of care (*CSW will initial, date and re-position this form in  chart as items are completed):  No   Patient/family provided with [place] Work Department's list of facilities offering this level of care within the geographic area requested by the patient (or if unable, by the patient's family).  Yes   Patient/family informed of their freedom to choose among providers that offer the needed level of care, that participate in Medicare, Medicaid or managed care program needed by the patient, have an available bed and are willing to accept the patient.  Yes   Patient/family informed of [place]'s ownership interest in [facility] and [facility], as well as of the fact that they are under no obligation to receive care at these facilities.  PASRR submitted to EDS on       PASRR number received on       Existing PASRR number confirmed on 05/07/15     FL2 transmitted to all facilities in geographic area requested by pt/family on 05/07/15     FL2 transmitted to all facilities within larger geographic area on       Patient informed that his/her managed care company has contracts with or will negotiate with certain facilities, including the following:        Yes   Patient/family informed of bed offers received.  Patient chooses bed at  [facility])     Physician recommends and patient chooses bed at      Patient to be transferred to   on  .  Patient to be transferred to facility by       Patient family notified on   of transfer.  Name of family member notified:        PHYSICIAN Please prepare priority discharge summary, including medications, Please prepare prescriptions     Additional Comment:     _______________________________________________ Ludwig Clarks, LCSW 05/07/2015, 4:19 PM

## 2015-05-07 NOTE — NC FL2 (Signed)
[place] LEVEL OF CARE SCREENING TOOL     IDENTIFICATION  Patient Name: Gregory Hatfield Birthdate: [birth year] Sex: male Admission Date (Current Location): 05/05/2015  [facility] and [place] Number: Herbalist and Address:  [facility],  [place], [place]      Provider Number: [id number]  Attending Physician Name and Address:  Eugenie Filler, MD  Relative Name and Phone Number:       Current Level of Care: Hospital Recommended Level of Care: [place] Prior Approval Number:    Date Approved/Denied:   PASRR Number:    Discharge Plan: SNF    Current Diagnoses: Patient Active Problem List   Diagnosis Date Noted  . Leucocytosis   . IDDM (insulin dependent diabetes mellitus) ([place])   . Elevated troponin 05/05/2015  . Acute renal failure superimposed on stage 4 chronic kidney disease ([place]) 05/05/2015  . Elevated CK 05/05/2015  . Uncontrolled insulin-dependent diabetes mellitus with renal manifestation ([place]) 05/05/2015  . Dyslipidemia associated with type 2 diabetes mellitus ([place]) 05/05/2015  . Urinary tract infection, site not specified 05/05/2015  . Leukocytosis 05/05/2015  . Fall 05/05/2015  . Acute encephalopathy 05/05/2015  . Essential hypertension 05/05/2015  . Anemia in chronic renal disease 02/04/2014  . Alzheimer's disease 06/16/2013    Orientation ACTIVITIES/SOCIAL BLADDER RESPIRATION    Self, Time, Situation, Place  Active Continent Normal  BEHAVIORAL SYMPTOMS/MOOD NEUROLOGICAL BOWEL NUTRITION STATUS      Continent    PHYSICIAN VISITS COMMUNICATION OF NEEDS Height & Weight Skin    Verbally 6' (182.9 cm) 214 lbs. Normal          AMBULATORY STATUS RESPIRATION    Assist extensive Normal      Personal Care Assistance Level of Assistance  Bathing, Dressing            Functional Limitations Info                SPECIAL CARE FACTORS FREQUENCY  PT (By licensed PT), OT (By  licensed OT)                   Additional Factors Info                  Current Medications (05/07/2015): Current Facility-Administered Medications  Medication Dose Route Frequency Provider Last Rate Last Dose  . 0.9 %  sodium chloride infusion   Intravenous Continuous [name], MD 75 mL/hr at 05/06/15 2008    . acetaminophen (TYLENOL) tablet 650 mg  650 mg Oral Q6H PRN [name], MD   650 mg at 05/06/15 2007   Or  . acetaminophen (TYLENOL) suppository 650 mg  650 mg Rectal Q6H PRN [name], MD      . aspirin chewable tablet 81 mg  81 mg Oral Daily [name], MD   81 mg at 05/07/15 1100  . carvedilol (COREG) tablet 12.5 mg  12.5 mg Oral BID WC [name], MD   12.5 mg at 05/07/15 0800  . cefTRIAXone (ROCEPHIN) 1 g in dextrose 5 % 50 mL IVPB  1 g Intravenous Q24H [name], MD   1 g at 05/06/15 1500  . cholecalciferol (VITAMIN D) tablet 5,000 Units  5,000 Units Oral Daily [name], MD   5,000 Units at 05/07/15 1100  . diltiazem (CARDIZEM CD) 24 hr capsule 240 mg  240 mg Oral Daily [name], MD   [phone]  mg at 05/07/15 1100  . hydrALAZINE (APRESOLINE) tablet 25 mg  25 mg Oral TID [name], MD   25 mg at 05/07/15 1100  . insulin aspart (novoLOG) injection 0-5 Units  0-5 Units Subcutaneous QHS [name], MD   2 Units at 05/06/15 2148  . insulin aspart (novoLOG) injection 0-9 Units  0-9 Units Subcutaneous TID WC [name], MD   5 Units at 05/07/15 1213  . insulin detemir (LEVEMIR) injection 25 Units  25 Units Subcutaneous Daily Eugenie Filler, MD   25 Units at 05/07/15 1000  . latanoprost (XALATAN) 0.005 % ophthalmic solution 1 drop  1 drop Right Eye QHS [name], MD   1 drop at 05/06/15 2258  . mirabegron ER (MYRBETRIQ) tablet 25 mg  25 mg Oral Daily [name], MD   25 mg at 05/07/15 1100  . multivitamin with minerals tablet 1 tablet  1 tablet Oral Daily [name], MD   1 tablet at 05/07/15 1100  . ondansetron (ZOFRAN) tablet  4 mg  4 mg Oral Q6H PRN [name], MD       Or  . ondansetron [facility]) injection 4 mg  4 mg Intravenous Q6H PRN [name], MD      . polyethylene glycol [facility] / GLYCOLAX) packet 17 g  17 g Oral Daily [name], MD      . pravastatin (PRAVACHOL) tablet 40 mg  40 mg Oral Daily [name], MD   40 mg at 05/07/15 1100  . saccharomyces boulardii (FLORASTOR) capsule 250 mg  250 mg Oral BID [name], MD   250 mg at 05/07/15 1100  . senna-docusate (Senokot-S) tablet 1 tablet  1 tablet Oral QHS [name], MD      . sodium bicarbonate tablet 650 mg  650 mg Oral BID Eugenie Filler, MD   650 mg at 05/07/15 1000  . sodium chloride 0.9 % injection 3 mL  3 mL Intravenous Q12H [name], MD   3 mL at 05/06/15 1000   Do not use this list as official medication orders. Please verify with discharge summary.  Discharge Medications:   Medication List    ASK your doctor about these medications        aspirin 81 MG tablet  Take 81 mg by mouth daily.     B-D UF III MINI PEN NEEDLES 31G X 5 MM Misc  Generic drug:  Insulin Pen Needle  USE FOR INJECTIONS AS DIRECTED     carvedilol 12.5 MG tablet  Commonly known as:  COREG  TAKE  (1)  TABLET TWICE A DAY FOR HIGH BLOOD PRESSURE.     diltiazem 240 MG 24 hr capsule  Commonly known as:  CARDIZEM CD  TAKE (1) CAPSULE DAILY FOR HIGH BLOOD PRESSURE.     docusate sodium 100 MG capsule  Commonly known as:  COLACE  Take 1 capsule (100 mg total) by mouth 2 (two) times daily.     furosemide 40 MG tablet  Commonly known as:  LASIX  TAKE 1 TABLET TWICE DAILY FOR BLOOD PRESSURE AND FLUIDS.     hydrALAZINE 25 MG tablet  Commonly known as:  APRESOLINE  Take 1 tablet (25 mg total) by mouth 3 (three) times daily.     hydrALAZINE 25 MG tablet  Commonly known as:  APRESOLINE  TAKE ONE TABLET 3 TIMES A DAY FOR BLOOD PRESSURE.     hyoscyamine  0.125 MG SL tablet  Commonly known as:  LEVSIN/SL  Place 1 tablet (0.125 mg total)  under the tongue every 4 (four) hours as needed.     LEVEMIR FLEXTOUCH 100 UNIT/ML Pen  Generic drug:  Insulin Detemir  Inject 48 Units into the skin every morning.     LEVEMIR FLEXTOUCH 100 UNIT/ML Pen  Generic drug:  Insulin Detemir  INJECT 0.5ML (50 UNITS) INTO THE SKIN DAILY     losartan 50 MG tablet  Commonly known as:  COZAAR  TAKE 1 TABLET ONCE DAILY.     LUMIGAN 0.01 % Soln  Generic drug:  bimatoprost  Place 1 drop into the right eye at bedtime.     multivitamin tablet  Take 1 tablet by mouth daily.     MYRBETRIQ 50 MG Tb24 tablet  Generic drug:  mirabegron ER  Take 50 mg by mouth daily.     pravastatin 40 MG tablet  Commonly known as:  PRAVACHOL  TAKE 1 TABLET ONCE DAILY.     PROBIOTIC DAILY PO  Take 1 tablet by mouth daily.     SIMBRINZA 1-0.2 % Susp  Generic drug:  Brinzolamide-Brimonidine  Place 1 drop into both eyes 3 (three) times daily.     TRUE METRIX BLOOD GLUCOSE TEST test strip  Generic drug:  glucose blood  CHECK BLOOD SUGAR 6 TIMES A DAY.     Vitamin D3 5000 UNITS Caps  Take 5,000 Units by mouth daily.        Relevant Imaging Results:  Relevant Lab Results:  Recent Labs    Additional Information SSN [id number]  Ludwig Clarks, LCSW

## 2015-05-08 ENCOUNTER — Inpatient Hospital Stay (HOSPITAL_COMMUNITY): Payer: Medicare PPO

## 2015-05-08 LAB — GLUCOSE, CAPILLARY
GLUCOSE-CAPILLARY: 242 mg/dL — AB (ref 65–99)
GLUCOSE-CAPILLARY: 127 mg/dL — AB (ref 65–99)
Glucose-Capillary: 156 mg/dL — ABNORMAL HIGH (ref 65–99)
Glucose-Capillary: 179 mg/dL — ABNORMAL HIGH (ref 65–99)

## 2015-05-08 LAB — CBC
HCT: 35 % — ABNORMAL LOW (ref 39.0–52.0)
HEMOGLOBIN: 11.4 g/dL — AB (ref 13.0–17.0)
MCH: 29.2 pg (ref 26.0–34.0)
MCHC: 32.6 g/dL (ref 30.0–36.0)
MCV: 89.7 fL (ref 78.0–100.0)
Platelets: 140 10*3/uL — ABNORMAL LOW (ref 150–400)
RBC: 3.9 MIL/uL — AB (ref 4.22–5.81)
RDW: 14.5 % (ref 11.5–15.5)
WBC: 14.4 10*3/uL — ABNORMAL HIGH (ref 4.0–10.5)

## 2015-05-08 LAB — BASIC METABOLIC PANEL
Anion gap: 7 (ref 5–15)
BUN: 48 mg/dL — AB (ref 6–20)
CHLORIDE: 112 mmol/L — AB (ref 101–111)
CO2: 20 mmol/L — AB (ref 22–32)
Calcium: 8 mg/dL — ABNORMAL LOW (ref 8.9–10.3)
Creatinine, Ser: 2.12 mg/dL — ABNORMAL HIGH (ref 0.61–1.24)
GFR calc Af Amer: 31 mL/min — ABNORMAL LOW (ref >60)
GFR calc non Af Amer: 26 mL/min — ABNORMAL LOW (ref >60)
GLUCOSE: 207 mg/dL — AB (ref 65–99)
POTASSIUM: 4.1 mmol/L (ref 3.5–5.1)
Sodium: 139 mmol/L (ref 135–145)

## 2015-05-08 LAB — URINE CULTURE: Culture: 1000

## 2015-05-08 LAB — OCCULT BLOOD X 1 CARD TO LAB, STOOL: FECAL OCCULT BLD: POSITIVE — AB

## 2015-05-08 LAB — MAGNESIUM: MAGNESIUM: 2.1 mg/dL (ref 1.7–2.4)

## 2015-05-08 MED ORDER — SORBITOL 70 % SOLN
960.0000 mL | TOPICAL_OIL | Freq: Once | ORAL | Status: AC
  Administered 2015-05-08: 960 mL via RECTAL
  Filled 2015-05-08: qty 240

## 2015-05-08 MED ORDER — SIMETHICONE 80 MG PO CHEW
160.0000 mg | CHEWABLE_TABLET | Freq: Four times a day (QID) | ORAL | Status: DC
  Administered 2015-05-08 – 2015-05-09 (×5): 160 mg via ORAL
  Filled 2015-05-08 (×5): qty 2

## 2015-05-08 MED ORDER — CEFUROXIME AXETIL 500 MG PO TABS
500.0000 mg | ORAL_TABLET | Freq: Two times a day (BID) | ORAL | Status: DC
  Administered 2015-05-08 – 2015-05-09 (×2): 500 mg via ORAL
  Filled 2015-05-08 (×4): qty 1

## 2015-05-08 MED ORDER — POLYETHYLENE GLYCOL 3350 17 G PO PACK
17.0000 g | PACK | Freq: Two times a day (BID) | ORAL | Status: DC
  Filled 2015-05-08 (×2): qty 1

## 2015-05-08 MED ORDER — INSULIN DETEMIR 100 UNIT/ML ~~LOC~~ SOLN
30.0000 [IU] | Freq: Every day | SUBCUTANEOUS | Status: DC
  Administered 2015-05-08 – 2015-05-09 (×2): 30 [IU] via SUBCUTANEOUS
  Filled 2015-05-08 (×2): qty 0.3

## 2015-05-08 MED ORDER — SODIUM CHLORIDE 0.45 % IV SOLN
INTRAVENOUS | Status: DC
  Administered 2015-05-08: 09:00:00 via INTRAVENOUS
  Filled 2015-05-08 (×2): qty 1000

## 2015-05-08 NOTE — Progress Notes (Signed)
Patient abd distended and firm, enc pt to move in bed and turn to left side to attempt to pass flatus, unsuccessful. Pt slept throughout the night without resp distress, abd remains distended. SRP, RN

## 2015-05-08 NOTE — Progress Notes (Signed)
Physical Therapy Treatment Patient Details Name: Gregory Hatfield MRN: [id number] DOB: 1926-04-04 Today's Date: May 18, 2015    History of Present Illness 81 yo male admitted with acute encephalopathy, falls, weaknesss. Hx of DM, CHF, CVA, diabetic neuropathy, CKD, HTN, L THA 2015, TURP 2016.     PT Comments    Pt ambulated in hallway with RW and able to improve distance slightly today.  Pt plans to d/c to SNF, likely later today.  Follow Up Recommendations  SNF     Equipment Recommendations  Rolling walker with 5" wheels    Recommendations for Other Services       Precautions / Restrictions Precautions Precautions: Fall    Mobility  Bed Mobility               General bed mobility comments: pt up in recliner on arrival  Transfers Overall transfer level: Needs assistance Equipment used: Rolling walker (2 wheeled) Transfers: Sit to/from Stand Sit to Stand: Mod assist         General transfer comment: assist to rise and steady from low recliner surface, verbal cues for hand placement, pt able to recite "nose over toes" for technique  Ambulation/Gait Ambulation/Gait assistance: Min guard Ambulation Distance (Feet): 120 Feet Assistive device: Rolling walker (2 wheeled) Gait Pattern/deviations: Step-through pattern;Decreased stride length;Trunk flexed     General Gait Details: verbal cues for RW positioning and posture, able to improve distance today however does reports 2/4 dyspnea upon return to room   Stairs            Wheelchair Mobility    Modified Rankin (Stroke Patients Only)       Balance                                    Cognition Arousal/Alertness: Awake/alert Behavior During Therapy: WFL for tasks assessed/performed Overall Cognitive Status: Within Functional Limits for tasks assessed                      Exercises      General Comments        Pertinent Vitals/Pain Pain Assessment: No/denies pain     Home Living                      Prior Function            PT Goals (current goals can now be found in the care plan section) Progress towards PT goals: Progressing toward goals    Frequency  Min 3X/week    PT Plan Current plan remains appropriate    Co-evaluation             End of Session Equipment Utilized During Treatment: Gait belt Activity Tolerance: Patient tolerated treatment well Patient left: in chair;with call bell/phone within reach;with chair alarm set     Time: 1005-1019 PT Time Calculation (min) (ACUTE ONLY): 14 min  Charges:  $Gait Training: 8-22 mins                    G Codes:      Gregory Hatfield,[name] May 18, 2015, 3:07 PM Carmelia Bake, PT, DPT 05-18-15 Pager: [phone]

## 2015-05-08 NOTE — Progress Notes (Signed)
[facility] PROGRESS NOTE  Gregory Hatfield [id number] DOB: 11/30/1925 DOA: 05/05/2015 PCP: Alesia Richards, MD  Assessment/Plan: #1 acute encephalopathy/fall Likely metabolic in nature secondary to probable UTI in the setting of Alzheimer's dementia. Patient with some clinical improvement close to baseline per daughter. Patient has been assessed by PT and recommended a skilled nursing facility. Urine cultures negative. Blood cultures pending. Chest x-ray was negative for any acute infiltrate however consented for possible bronchitis. Change IV Rocephin to oral Ceftin. Follow.  #2 elevated CK/elevated troponins CK on admission was 7460 and likely secondary to fall/trauma. Patient was placed on IV fluids with improvement in CK levels which are currently at 1457. Continue IV fluids. Elevated troponins likely secondary to a demand ischemia secondary to fall as to ponies seem to have plateaued. Troponins tended down. 2-D echo obtained with normal EF with no wall motion abnormalities. Follow.  #3 probable urinary tract infection Urine cultures with no significant growth. Urine cultures were not obtained on admission and were obtained after patient had received IV antibiotics. Patient with clinical improvement. Change IV Rocephin to oral Ceftin to complete a course of antibiotic therapy.   #4 fever Likely secondary to probable UTI. Fever curve trending down. Urine cultures with no significant growth. Blood cultures pending. Change IV Rocephin to oral Ceftin. Follow.  #5 hypokalemia Replete.  #6 acute on chronic kidney disease stage III-IV Baseline creatinine 1.4-1.7. Likely a prerenal azotemia in the setting of diuretics and ARB. Diuretics on hold. Creatinine on admission was 3.03. Renal function improved and currently at 2.12. Continue to hold diuretics and ARB. Decrease IV fluids to 50 mL per hour. Follow.  #7 anemia of chronic disease Stable. Follow.  #8  hypertension Continue hydralazine, Cardizem, Coreg. Losartan and diuretics on hold secondary to worsening renal function. Follow.  #9 uncontrolled insulin-dependent diabetes mellitus   patient noted to be hypoglycemic initially. Hemoglobin A1c is 8.2 on 05/05/2015. CBGs have ranged from 156-309. Increase Levemir to 30 units daily. Continue sliding scale insulin.  #10 Alzheimer's dementia without behavioral disturbance Stable.  #11 dyslipidemia Continue statin.  #12 prophylaxis SCDs for DVT prophylaxis.    Code Status: Full Family Communication: Updated patient and daughter at bedside. Disposition Plan: To skilled nursing facility when afebrile for at least 24 hours, renal function is improved and close to baseline.    Consultants:  None  Procedures:  2-D echo 05/06/2015  CT head/CT C-spine 05/05/2015  Chest x-ray 05/05/2015  Antibiotics:  IV Rocephin 05/05/2015>>>>> 05/08/2015  Oral Ceftin 05/08/2015  HPI/Subjective: Patient states he's feeling better. Patient denies any chest pain. No shortness of breath.  Objective: Filed Vitals:   05/08/15 1040  BP: 130/60  Pulse:   Temp:   Resp:     Intake/Output Summary (Last 24 hours) at 05/08/15 1210 Last data filed at 05/08/15 0900  Gross per 24 hour  Intake   1380 ml  Output   1126 ml  Net    254 ml   Filed Weights   05/06/15 0520 05/07/15 0558 05/08/15 0437  Weight: 98.2 kg (216 lb 7.9 oz) 97.07 kg (214 lb) 101.8 kg (224 lb 6.9 oz)    Exam:   General:  NAD  Cardiovascular: RRR  Respiratory: Clear to auscultation bilaterally.  Abdomen: Soft, nontender, distended, positive bowel sounds.  Musculoskeletal: No clubbing cyanosis or edema.  Data Reviewed: Basic Metabolic Panel:  Recent Labs Lab 05/05/15 0750 05/05/15 1313 05/06/15 0417 05/07/15 0708 05/08/15 0417  NA 140 140 138 139 139  K  3.5 3.8 3.4* 3.8 4.1  CL 106 106 109 110 112*  CO2 23 26 19* 19* 20*  GLUCOSE 45* 132* 164* 265* 207*   BUN 48* 50* 51* 54* 48*  CREATININE 3.03* 2.83* 2.67* 2.44* 2.12*  CALCIUM 9.1 8.7* 8.0* 8.2* 8.0*  MG  --  1.9  --  2.1 2.1  PHOS  --  3.4  --   --   --    Liver Function Tests:  Recent Labs Lab 05/05/15 0750 05/05/15 1313 05/06/15 0417 05/07/15 0708  AST 180* 222* 156* 87*  ALT 51 63 58 60  ALKPHOS 60 60 53 63  BILITOT 0.8 1.0 0.7 0.8  PROT 7.2 6.9 6.0* 6.2*  ALBUMIN 3.8 3.6 3.0* 2.7*   No results for input(s): LIPASE, AMYLASE in the last 168 hours. No results for input(s): AMMONIA in the last 168 hours. CBC:  Recent Labs Lab 05/05/15 0750 05/05/15 1313 05/06/15 0417 05/07/15 0708 05/08/15 0417  WBC 16.7* 14.9* 12.4* 13.0* 14.4*  NEUTROABS  --  12.9*  --   --   --   HGB 12.8* 12.8* 11.8* 11.9* 11.4*  HCT 38.8* 39.5 36.6* 36.6* 35.0*  MCV 90.2 90.2 90.1 90.4 89.7  PLT 145* 130* 122* 118* 140*   Cardiac Enzymes:  Recent Labs Lab 05/05/15 0750 05/05/15 1313 05/05/15 1820 05/06/15 05/06/15 0417 05/06/15 1908 05/07/15 0105 05/07/15 0648 05/07/15 0708  CKTOTAL 7460* 8727*  --   --  3815*  --   --   --  1457*  TROPONINI 0.11* 0.11* 0.12* 0.12*  --  0.07* 0.07* 0.06*  --    BNP (last 3 results) No results for input(s): BNP in the last 8760 hours.  ProBNP (last 3 results) No results for input(s): PROBNP in the last 8760 hours.  CBG:  Recent Labs Lab 05/07/15 0743 05/07/15 1201 05/07/15 1713 05/08/15 0755 05/08/15 1152  GLUCAP 198* 289* 309* 156* 179*    Recent Results (from the past 240 hour(s))  MRSA PCR Screening     Status: None   Collection Time: 05/05/15  2:42 PM  Result Value Ref Range Status   MRSA by PCR NEGATIVE NEGATIVE Final    Comment:        The GeneXpert MRSA Assay (FDA approved for NASAL specimens only), is one component of a comprehensive MRSA colonization surveillance program. It is not intended to diagnose MRSA infection nor to guide or monitor treatment for MRSA infections.   Culture, blood (routine x 2)     Status:  None (Preliminary result)   Collection Time: 05/06/15  6:25 PM  Result Value Ref Range Status   Specimen Description BLOOD RIGHT ARM  Final   Special Requests BOTTLES DRAWN AEROBIC AND ANAEROBIC  10CC  Final   Culture   Final    NO GROWTH 2 DAYS Performed at [facility]    Report Status PENDING  Incomplete  Culture, blood (routine x 2)     Status: None (Preliminary result)   Collection Time: 05/06/15  6:30 PM  Result Value Ref Range Status   Specimen Description BLOOD RIGHT HAND  Final   Special Requests BOTTLES DRAWN AEROBIC AND ANAEROBIC  5CC  Final   Culture   Final    NO GROWTH 2 DAYS Performed at [facility]    Report Status PENDING  Incomplete  Culture, Urine     Status: None   Collection Time: 05/06/15  9:49 PM  Result Value Ref Range Status   Specimen  Description URINE, RANDOM BAG  Final   Special Requests NONE  Final   Culture   Final    1,000 COLONIES/mL INSIGNIFICANT GROWTH Performed at [facility]    Report Status 05/08/2015 FINAL  Final     Studies: Dg Abd 1 View  05/08/2015  CLINICAL DATA:  Abdominal distention and pain EXAM: ABDOMEN - 1 VIEW COMPARISON:  None. FINDINGS: Scattered large and small bowel gas is noted. No definitive obstructive change is seen. Postsurgical changes are noted. No free air is noted. IMPRESSION: No evidence of focal obstruction. Electronically Signed   By: Inez Catalina M.D.   On: 05/08/2015 09:25    Scheduled Meds: . aspirin  81 mg Oral Daily  . carvedilol  12.5 mg Oral BID WC  . cefTRIAXone (ROCEPHIN)  IV  1 g Intravenous Q24H  . cholecalciferol  5,000 Units Oral Daily  . diltiazem  240 mg Oral Daily  . hydrALAZINE  25 mg Oral TID  . insulin aspart  0-5 Units Subcutaneous QHS  . insulin aspart  0-9 Units Subcutaneous TID WC  . insulin detemir  30 Units Subcutaneous Daily  . latanoprost  1 drop Right Eye QHS  . mirabegron ER  25 mg Oral Daily  . multivitamin with minerals  1 tablet Oral Daily  .  polyethylene glycol  17 g Oral Daily  . pravastatin  40 mg Oral Daily  . saccharomyces boulardii  250 mg Oral BID  . senna-docusate  1 tablet Oral QHS  . simethicone  160 mg Oral QID  . sodium bicarbonate  650 mg Oral BID  . sodium chloride  3 mL Intravenous Q12H  . sorbitol, milk of mag, mineral oil, glycerin (SMOG) enema  960 mL Rectal Once   Continuous Infusions: . sodium chloride 0.45 % 1,000 mL infusion 50 mL/hr at 05/08/15 0830    Principal Problem:   Acute encephalopathy Active Problems:   Alzheimer's disease   Anemia in chronic renal disease   Elevated troponin   Acute renal failure superimposed on stage 4 chronic kidney disease (HCC)   Elevated CK   Uncontrolled insulin-dependent diabetes mellitus with renal manifestation (HCC)   Dyslipidemia associated with type 2 diabetes mellitus (HCC)   Urinary tract infection, site not specified   Leukocytosis   Fall   Essential hypertension   IDDM (insulin dependent diabetes mellitus) ([place])   Leucocytosis    Time spent: 54 minutes    Letanya Froh M.D. [facility] Pager [phone]. If 7PM-7AM, please contact night-coverage at [email or web address], password [facility] 05/08/2015, 12:10 PM  LOS: 3 days

## 2015-05-08 NOTE — NC FL2 (Signed)
[place] LEVEL OF CARE SCREENING TOOL     IDENTIFICATION  Patient Name: Gregory Hatfield Birthdate: 03-12-1926 Sex: male Admission Date (Current Location): 05/05/2015  [facility] [place] Number: Herbalist and Address:  [facility],  [address], [place]      Provider Number: [id number]  Attending Physician Name and Address:  Eugenie Filler, MD  Relative Name and Phone Number:       Current Level of Care: Hospital Recommended Level of Care: [place] Prior Approval Number:    Date Approved/Denied:   PASRR Number:   [id number]   Discharge Plan: SNF    Current Diagnoses: Patient Active Problem List   Diagnosis Date Noted  . Leucocytosis   . IDDM (insulin dependent diabetes mellitus) ([place])   . Elevated troponin 05/05/2015  . Acute renal failure superimposed on stage 4 chronic kidney disease ([place]) 05/05/2015  . Elevated CK 05/05/2015  . Uncontrolled insulin-dependent diabetes mellitus with renal manifestation ([place]) 05/05/2015  . Dyslipidemia associated with type 2 diabetes mellitus ([place]) 05/05/2015  . Urinary tract infection, site not specified 05/05/2015  . Leukocytosis 05/05/2015  . Fall 05/05/2015  . Acute encephalopathy 05/05/2015  . Essential hypertension 05/05/2015  . Anemia in chronic renal disease 02/04/2014  . Alzheimer's disease 06/16/2013    Orientation ACTIVITIES/SOCIAL BLADDER RESPIRATION    Self, Time, Situation, Place  Active Continent Normal  BEHAVIORAL SYMPTOMS/MOOD NEUROLOGICAL BOWEL NUTRITION STATUS      Continent    PHYSICIAN VISITS COMMUNICATION OF NEEDS Height & Weight Skin    Verbally 6' (182.9 cm) 214 lbs. Normal          AMBULATORY STATUS RESPIRATION    Assist extensive Normal      Personal Care Assistance Level of Assistance  Bathing, Dressing            Functional Limitations Info                SPECIAL CARE FACTORS FREQUENCY  PT (By  licensed PT), OT (By licensed OT)                   Additional Factors Info                  Current Medications (05/08/2015): Current Facility-Administered Medications  Medication Dose Route Frequency Provider Last Rate Last Dose  . acetaminophen (TYLENOL) tablet 650 mg  650 mg Oral Q6H PRN [name], MD   650 mg at 05/06/15 2007   Or  . acetaminophen (TYLENOL) suppository 650 mg  650 mg Rectal Q6H PRN [name], MD      . aspirin chewable tablet 81 mg  81 mg Oral Daily [name], MD   81 mg at 05/07/15 1100  . carvedilol (COREG) tablet 12.5 mg  12.5 mg Oral BID WC [name], MD   12.5 mg at 05/08/15 0827  . cefTRIAXone (ROCEPHIN) 1 g in dextrose 5 % 50 mL IVPB  1 g Intravenous Q24H [name], MD   1 g at 05/07/15 1500  . cholecalciferol (VITAMIN D) tablet 5,000 Units  5,000 Units Oral Daily [name], MD   5,000 Units at 05/07/15 1100  . diltiazem (CARDIZEM CD) 24 hr capsule 240 mg  240 mg Oral Daily [name], MD   240 mg at 05/07/15 1100  . hydrALAZINE (APRESOLINE) tablet 25 mg  25 mg Oral TID [name], MD  25 mg at 05/07/15 2123  . insulin aspart (novoLOG) injection 0-5 Units  0-5 Units Subcutaneous QHS [name], MD   2 Units at 05/06/15 2148  . insulin aspart (novoLOG) injection 0-9 Units  0-9 Units Subcutaneous TID WC [name], MD   1 Units at 05/08/15 [phone]  . insulin detemir (LEVEMIR) injection 30 Units  30 Units Subcutaneous Daily [name], MD      . latanoprost (XALATAN) 0.005 % ophthalmic solution 1 drop  1 drop Right Eye QHS [name], MD   1 drop at 05/07/15 2120  . mirabegron ER (MYRBETRIQ) tablet 25 mg  25 mg Oral Daily [name], MD   25 mg at 05/07/15 1100  . multivitamin with minerals tablet 1 tablet  1 tablet Oral Daily [name], MD   1 tablet at 05/07/15 1100  . ondansetron (ZOFRAN) tablet 4 mg  4 mg Oral Q6H PRN [name], MD       Or  . ondansetron [facility]) injection 4 mg  4 mg Intravenous  Q6H PRN [name], MD      . polyethylene glycol [facility] / GLYCOLAX) packet 17 g  17 g Oral Daily [name], MD      . pravastatin (PRAVACHOL) tablet 40 mg  40 mg Oral Daily [name], MD   40 mg at 05/07/15 1100  . saccharomyces boulardii (FLORASTOR) capsule 250 mg  250 mg Oral BID [name], MD   250 mg at 05/07/15 2123  . senna-docusate (Senokot-S) tablet 1 tablet  1 tablet Oral QHS Eugenie Filler, MD   1 tablet at 05/07/15 2123  . sodium bicarbonate tablet 650 mg  650 mg Oral BID Eugenie Filler, MD   650 mg at 05/07/15 2123  . sodium chloride 0.45 % 1,000 mL infusion   Intravenous Continuous Eugenie Filler, MD 50 mL/hr at 05/08/15 0830    . sodium chloride 0.9 % injection 3 mL  3 mL Intravenous Q12H [name], MD   3 mL at 05/06/15 1000  . sorbitol, milk of mag, mineral oil, glycerin (SMOG) enema  960 mL Rectal Once Eugenie Filler, MD       Do not use this list as official medication orders. Please verify with discharge summary.  Discharge Medications:   Medication List    ASK your doctor about these medications        aspirin 81 MG tablet  Take 81 mg by mouth daily.     B-D UF III MINI PEN NEEDLES 31G X 5 MM Misc  Generic drug:  Insulin Pen Needle  USE FOR INJECTIONS AS DIRECTED     carvedilol 12.5 MG tablet  Commonly known as:  COREG  TAKE  (1)  TABLET TWICE A DAY FOR HIGH BLOOD PRESSURE.     diltiazem 240 MG 24 hr capsule  Commonly known as:  CARDIZEM CD  TAKE (1) CAPSULE DAILY FOR HIGH BLOOD PRESSURE.     docusate sodium 100 MG capsule  Commonly known as:  COLACE  Take 1 capsule (100 mg total) by mouth 2 (two) times daily.     furosemide 40 MG tablet  Commonly known as:  LASIX  TAKE 1 TABLET TWICE DAILY FOR BLOOD PRESSURE AND FLUIDS.     hydrALAZINE 25 MG tablet  Commonly known as:  APRESOLINE  Take 1 tablet (25 mg total) by mouth 3 (three) times daily.  hydrALAZINE 25 MG tablet  Commonly known as:  APRESOLINE  TAKE ONE  TABLET 3 TIMES A DAY FOR BLOOD PRESSURE.     hyoscyamine 0.125 MG SL tablet  Commonly known as:  LEVSIN/SL  Place 1 tablet (0.125 mg total) under the tongue every 4 (four) hours as needed.     LEVEMIR FLEXTOUCH 100 UNIT/ML Pen  Generic drug:  Insulin Detemir  Inject 48 Units into the skin every morning.     LEVEMIR FLEXTOUCH 100 UNIT/ML Pen  Generic drug:  Insulin Detemir  INJECT 0.5ML (50 UNITS) INTO THE SKIN DAILY     losartan 50 MG tablet  Commonly known as:  COZAAR  TAKE 1 TABLET ONCE DAILY.     LUMIGAN 0.01 % Soln  Generic drug:  bimatoprost  Place 1 drop into the right eye at bedtime.     multivitamin tablet  Take 1 tablet by mouth daily.     MYRBETRIQ 50 MG Tb24 tablet  Generic drug:  mirabegron ER  Take 50 mg by mouth daily.     pravastatin 40 MG tablet  Commonly known as:  PRAVACHOL  TAKE 1 TABLET ONCE DAILY.     PROBIOTIC DAILY PO  Take 1 tablet by mouth daily.     SIMBRINZA 1-0.2 % Susp  Generic drug:  Brinzolamide-Brimonidine  Place 1 drop into both eyes 3 (three) times daily.     TRUE METRIX BLOOD GLUCOSE TEST test strip  Generic drug:  glucose blood  CHECK BLOOD SUGAR 6 TIMES A DAY.     Vitamin D3 5000 UNITS Caps  Take 5,000 Units by mouth daily.        Relevant Imaging Results:  Relevant Lab Results:  Recent Labs    Additional Information SSN [id number]  Ludwig Clarks, LCSW

## 2015-05-08 NOTE — Care Management Important Message (Signed)
Important Message  Patient Details  Name: Gregory Hatfield MRN: [id number] Date of Birth: 1926/01/12   Medicare Important Message Given:  Yes    [name] 05/08/2015, 12:41 [place] Message  Patient Details  Name: Gregory Hatfield MRN: [id number] Date of Birth: 12/04/1925   Medicare Important Message Given:  Yes    [name] 05/08/2015, 12:41 PM

## 2015-05-08 NOTE — Progress Notes (Signed)
SNF bed accepted at [facility]- transfer on hold until medically stable- will have weekend CSW follow up for possible dc. Family and patient agreeable to plans.   Eduard Clos, MSW, [place]

## 2015-05-08 NOTE — Progress Notes (Addendum)
Nutrition Follow-up  DOCUMENTATION CODES:   Not applicable  INTERVENTION:  - Continue Regular diet - RD will continue to monitor for needs  NUTRITION DIAGNOSIS:   Altered nutrition lab value related to acute illness as evidenced by other (see comment) (CBGs: 33-166 mg/dL). -CBGs now 156-245 mg/dL.  GOAL:   Patient will meet greater than or equal to 90% of their needs -variably met  MONITOR:   PO intake, Weight trends, Labs, I & O's  ASSESSMENT:   81 year-old male with past medical history significant for hypertension, diabetes on insulin, CKD stage 4 (baseline Cr 1.7 in 03/2014), dyslipidemia who presented to [facility] long status post fall at home. Because of altered mental status patient is not a good historian and there is no family at bedside to provide details of present illness. Based on review of his chart and information provided by ED physician, patient apparently was in a good state of health and was driving himself to church but has reported difficulty walking over past few days. No loss of consciousness after the fall.  11/18 Per chart review, pt ate 25% of breakfast 11/15, 50% of breakfast 11/16, and 100% of breakfast and lunch yesterday (11/17). Pt reports he ate 50% of breakfast this AM which he states consisted of [place] sausage, a cinnamon roll, and grits. Pt denies abdominal pain or nausea with intakes.   Pt's daughter is at bedside and reports that pt is on DASH diet and that he typically follows this well. He mainly avoids high sodium-containing foods although he sometimes likes to get onion rings at fast food restaurants. She states he has no chewing or swallowing issues and was not drinking nutrition supplements, such as Ensure, PTA.  No questions or concerns at this time. Variably meeting needs. Pt states he eats until he feels full and does not feel the need to always eat 100% of meals. Medications reviewed. Labs reviewed; CBGs: 156-245 mg/dL, Cl: 112 mmol/L,  BUN/creatinine elevated but trending down, Ca: 8 mg/dL, GFR: 31.    11/15 - Per notes, pt with AMS and is not a good historian. No family/visitors in the room at time of RD visit.  - Pt states that he had a good appetite with no recent changes PTA and denies chewing or swallowing issues.  - He denies recent weight fluctuations. Unable to confirm accuracy related to appetite and chewing/swallowing.  - Per char review, pt's weight has been stable x10 months (205-212 lbs). No muscle or fat wasting or edema noted.  - No intakes documented since admission.   Diet Order:  Diet regular Room service appropriate?: Yes; Fluid consistency:: Thin  Skin:  Reviewed, no issues  Last BM:  11/15  Height:   Ht Readings from Last 1 Encounters:  05/05/15 6' (1.829 m)    Weight:   Wt Readings from Last 1 Encounters:  05/08/15 224 lb 6.9 oz (101.8 kg)    Ideal Body Weight:  80.91 kg (kg)  BMI:  Body mass index is 30.43 kg/(m^2).  Estimated Nutritional Needs:   Kcal:  1450-1650  Protein:  65-75 grams  Fluid:  1.8-2 L/day  EDUCATION NEEDS:   No education needs identified at this time      Jarome Matin, RD, LDN Inpatient Clinical Dietitian Pager # [phone] After hours/weekend pager # [phone]

## 2015-05-09 DIAGNOSIS — K5900 Constipation, unspecified: Secondary | ICD-10-CM

## 2015-05-09 LAB — CBC
HCT: 34.8 % — ABNORMAL LOW (ref 39.0–52.0)
Hemoglobin: 11.5 g/dL — ABNORMAL LOW (ref 13.0–17.0)
MCH: 29.7 pg (ref 26.0–34.0)
MCHC: 33 g/dL (ref 30.0–36.0)
MCV: 89.9 fL (ref 78.0–100.0)
PLATELETS: 150 10*3/uL (ref 150–400)
RBC: 3.87 MIL/uL — AB (ref 4.22–5.81)
RDW: 14.6 % (ref 11.5–15.5)
WBC: 13.6 10*3/uL — ABNORMAL HIGH (ref 4.0–10.5)

## 2015-05-09 LAB — OCCULT BLOOD X 1 CARD TO LAB, STOOL: Fecal Occult Bld: POSITIVE — AB

## 2015-05-09 LAB — BASIC METABOLIC PANEL
Anion gap: 9 (ref 5–15)
BUN: 42 mg/dL — AB (ref 6–20)
CALCIUM: 8.4 mg/dL — AB (ref 8.9–10.3)
CO2: 21 mmol/L — AB (ref 22–32)
CREATININE: 1.85 mg/dL — AB (ref 0.61–1.24)
Chloride: 113 mmol/L — ABNORMAL HIGH (ref 101–111)
GFR calc non Af Amer: 31 mL/min — ABNORMAL LOW (ref >60)
GFR, EST AFRICAN AMERICAN: 36 mL/min — AB (ref >60)
Glucose, Bld: 99 mg/dL (ref 65–99)
Potassium: 3.8 mmol/L (ref 3.5–5.1)
SODIUM: 143 mmol/L (ref 135–145)

## 2015-05-09 LAB — GLUCOSE, CAPILLARY
GLUCOSE-CAPILLARY: 138 mg/dL — AB (ref 65–99)
Glucose-Capillary: 75 mg/dL (ref 65–99)

## 2015-05-09 LAB — CK: CK TOTAL: 277 U/L (ref 49–397)

## 2015-05-09 MED ORDER — POLYETHYLENE GLYCOL 3350 17 G PO PACK: 17.0000 g | PACK | Freq: Two times a day (BID) | ORAL | Status: DC

## 2015-05-09 MED ORDER — LOSARTAN POTASSIUM 50 MG PO TABS: 50.0000 mg | ORAL_TABLET | Freq: Every day | ORAL | Status: DC

## 2015-05-09 MED ORDER — SIMETHICONE 80 MG PO CHEW: 160.0000 mg | CHEWABLE_TABLET | Freq: Four times a day (QID) | ORAL | Status: DC

## 2015-05-09 MED ORDER — BENZONATATE 100 MG PO CAPS: 200.0000 mg | ORAL_CAPSULE | Freq: Three times a day (TID) | ORAL | Status: DC

## 2015-05-09 MED ORDER — CEFUROXIME AXETIL 500 MG PO TABS: 500.0000 mg | ORAL_TABLET | Freq: Two times a day (BID) | ORAL | Status: DC

## 2015-05-09 MED ORDER — INSULIN DETEMIR 100 UNIT/ML FLEXPEN: 30.0000 [IU] | PEN_INJECTOR | SUBCUTANEOUS | Status: DC

## 2015-05-09 MED ORDER — BENZONATATE 100 MG PO CAPS
200.0000 mg | ORAL_CAPSULE | Freq: Three times a day (TID) | ORAL | Status: DC
  Administered 2015-05-09: 200 mg via ORAL
  Filled 2015-05-09: qty 2

## 2015-05-09 MED ORDER — SENNOSIDES-DOCUSATE SODIUM 8.6-50 MG PO TABS: 1.0000 | ORAL_TABLET | Freq: Every day | ORAL | Status: DC

## 2015-05-09 MED ORDER — FUROSEMIDE 40 MG PO TABS: 40.0000 mg | ORAL_TABLET | Freq: Every day | ORAL | Status: DC

## 2015-05-09 NOTE — Clinical Social Work Placement (Signed)
   CLINICAL SOCIAL WORK PLACEMENT  NOTE  Date:  05/09/2015  Patient Details  Name: Gregory Hatfield MRN: [id number] Date of Birth: 09-30-1925  Clinical Social Work is seeking post-discharge placement for this patient at the [place] level of care (*CSW will initial, date and re-position this form in  chart as items are completed):  No   Patient/family provided with [place] Work Department's list of facilities offering this level of care within the geographic area requested by the patient (or if unable, by the patient's family).  Yes   Patient/family informed of their freedom to choose among providers that offer the needed level of care, that participate in Medicare, Medicaid or managed care program needed by the patient, have an available bed and are willing to accept the patient.  Yes   Patient/family informed of [facility]'s ownership interest in [facility], as well as of the fact that they are under no obligation to receive care at these facilities.  PASRR submitted to EDS on       PASRR number received on       Existing PASRR number confirmed on 05/07/15     FL2 transmitted to all facilities in geographic area requested by pt/family on 05/07/15     FL2 transmitted to all facilities within larger geographic area on       Patient informed that his/her managed care company has contracts with or will negotiate with certain facilities, including the following:        Yes   Patient/family informed of bed offers received.  Patient chooses bed at [facility])     Physician recommends and patient chooses bed at      Patient to be transferred to [facility] on 05/09/15.  Patient to be transferred to facility by [facility]     Patient family notified on 05/09/15 of transfer.  Name of family member notified:  DAUGHTER     PHYSICIAN Please prepare priority discharge summary, including medications, Please  prepare prescriptions     Additional Comment: Pt / family are in agreement with de/c to [facility] today. [facility] transport is required. Medical necessity form completed. [employer] medicare has provided prior authorization. D/C Summary sent to SNF for review prior to d/c.   _______________________________________________ Luretha Rued, [place] 05/09/2015, 2:04 PM

## 2015-05-09 NOTE — Progress Notes (Signed)
Called report to [facility]. Awaiting transport.  Rosine Beat, RN 1:02 PM

## 2015-05-09 NOTE — Discharge Summary (Signed)
Physician Discharge Summary  Maanav Blinn Justin [id number] DOB: March 25, 1926 DOA: 05/05/2015  PCP: Alesia Richards, MD  Admit date: 05/05/2015 Discharge date: 05/09/2015  Time spent: 65 minutes  Recommendations for Outpatient Follow-up:  1. Patient be discharged to [facility] place. Patient will follow-up with M.D. at skilled nursing facility. Patient needs a basic metabolic profile drawn in one week to follow-up on electrolytes and renal function.   Discharge Diagnoses:  Principal Problem:   Acute encephalopathy Active Problems:   Alzheimer's disease   Anemia in chronic renal disease   Elevated troponin   Acute renal failure superimposed on stage 4 chronic kidney disease (HCC)   Elevated CK   Uncontrolled insulin-dependent diabetes mellitus with renal manifestation (HCC)   Dyslipidemia associated with type 2 diabetes mellitus (HCC)   Urinary tract infection, site not specified   Leukocytosis   Fall   Essential hypertension   IDDM (insulin dependent diabetes mellitus) ([place])   Leucocytosis   Constipation   Discharge Condition: Stable and improved.  Diet recommendation: Carb modified diet.  Filed Weights   05/07/15 0558 05/08/15 0437 05/09/15 0515  Weight: 97.07 kg (214 lb) 101.8 kg (224 lb 6.9 oz) 102 kg (224 lb 13.9 oz)    History of present illness:  Per Dr Charlies Silvers 81 year-old male with past medical history significant for hypertension, diabetes on insulin, CKD stage 4 (baseline Cr 1.7 in 03/2014), dyslipidemia who presented to [facility] long status post fall at home. Because of altered mental status patient was not a good historian and there was no family at bedside to provide details of present illness. Based on review of his chart and information provided by ED physician, patient apparently was in a good state of health and was driving himself to church but has had reported difficulty walking over past few days. No loss of consciousness after the fall.  In ED, patient  was hemodynamically stable. Blood work was notable for leukocytosis of 16.7, hemoglobin 12.8, platelets 145, creatinine 3.03, troponin level 0.11 and glucose of 45. CK level was 7460. Chest x-ray showed bronchitic changes with right basilar atelectasis. CT head and cervical spine did not show acute intracranial findings. Urinalysis demonstrated leukocytes with many bacteria and too numerous to count white blood cells. Patient was started on empiric Rocephin.  Hospital Course:  #1 acute encephalopathy/fall Likely metabolic in nature secondary to probable UTI in the setting of Alzheimer's dementia. Patient was placed empirically on IV Rocephin. Urine cultures were drawn after IV antibiotics were given. Blood cultures were also obtained with no growth to date. Patient's fever curve trended down. Urine cultures were negative. Chest x-ray was negative for any acute infiltrate, however consistent with possible bronchitis. Patient was subsequently transitioned from IV Rocephin to oral Ceftin and will be discharged on 4 more days of oral Ceftin to complete a course of antibiotic therapy.  Patient was assessed by PT and recommended a skilled nursing facility. patient improved clinically such that by day of discharge patient was back at his baseline.   #2 elevated CK/elevated troponins CK on admission was 7460 and likely secondary to fall/trauma. Patient was placed on IV fluids with improvement in CK levels which were 8727 on day of admission and had improved to 277 on day of discharge. Elevated troponins likely secondary to a demand ischemia secondary to fall as troponins plateaued. Troponins tended down. 2-D echo obtained with normal EF with no wall motion abnormalities.  Patient denied any chest pain.  #3 probable urinary tract infection On admission  urinalysis which was done was consistent with a UTI. Patient was placed empirically on IV Rocephin. Patient had received IV antibiotics prior to urine cultures to be  drawn. Urine cultures with no significant growth. Patient improved clinically. IV Rocephin was subsequently transitioned to oral Ceftin. Patient be discharged on 4 more days of oral Ceftin to complete a course of antibiotic therapy.   #4 fever Likely secondary to probable UTI. Patient was pancultured with no growth to date. Patient had been started empirically on IV Rocephin will have a urine cultures were drawn after IV Rocephin was started. Patient's fever curve trended down.   #5 hypokalemia Repleted.  #6 acute on chronic kidney disease stage III-IV Baseline creatinine 1.4-1.7. Likely a prerenal azotemia in the setting of diuretics and ARB. Diuretics and ARB were held during the hospitalization. Creatinine on admission was 3.03.  Patient was gently hydrated with IV fluids and renal function improved daily such that by day of discharge patient was close to his baseline. Patient's creatinine on discharge was 1.85. Patient's ARB and diuretics had to be resumed in 3-4 days with close outpatient follow-up.  #7 anemia of chronic disease Stable. Follow.  #8 hypertension Patient was maintained on his home regimen of hydralazine, Cardizem, Coreg. Losartan and diuretics were held during the hospitalization secondary to worsening renal function.  patient's renal function improved and was close to baseline by day of discharge. Patient's losartan and diuretics will be resumed in about 3-4 days post discharge.   #9 uncontrolled insulin-dependent diabetes mellitus  patient noted to be hypoglycemic initially. Hemoglobin A1c was 8.2 on 05/05/2015. Patient was started on Levemir at half his home dose which was uptitrated to 30 units daily. Patient was also maintained on a sliding scale insulin. Patient will be maintained on Levemir 30 units daily on discharge. Outpatient follow-up.   #10 Alzheimer's dementia without behavioral disturbance Stable.  #11 dyslipidemia Continued home regimen of statin.  #12  constipation During the hospitalization patient was noted to have some abdominal distention which was felt to be likely secondary to constipation. KUB was obtained which was negative for any obstruction. Patient was given a soapsuds enema with no significant results. Patient was subsequently given a SMOG enema and had a significant amount of stool output. Patient was placed on MiraLAX twice a day as well as Senokot-S for bowel regimen. Patient's constipation resolved by day of discharge. Outpatient follow-up.   Procedures:  2-D echo 05/06/2015  CT head/CT C-spine 05/05/2015  Chest x-ray 05/05/2015  Consultations:  None  Discharge Exam: Filed Vitals:   05/09/15 0751  BP:   Pulse: 62  Temp:   Resp:     General: NAD Cardiovascular: RRR Respiratory: CTAB Abd: Soft/distended/NTTP/+BS  Discharge Instructions   Discharge Instructions    Diet Carb Modified    Complete by:  As directed      Increase activity slowly    Complete by:  As directed           Current Discharge Medication List    START taking these medications   Details  benzonatate (TESSALON PERLES) 100 MG capsule Take 2 capsules (200 mg total) by mouth 3 (three) times daily. Take for 5 days then stop. Qty: 20 capsule, Refills: 0    cefUROXime (CEFTIN) 500 MG tablet Take 1 tablet (500 mg total) by mouth 2 (two) times daily with a meal. Take for 4 days, then stop. Qty: 8 tablet, Refills: 0    polyethylene glycol (MIRALAX / GLYCOLAX) packet Take 17  g by mouth 2 (two) times daily. Qty: 14 each, Refills: 0    senna-docusate (SENOKOT-S) 8.6-50 MG tablet Take 1 tablet by mouth at bedtime.    simethicone (MYLICON) 80 MG chewable tablet Chew 2 tablets (160 mg total) by mouth 4 (four) times daily. Use for 4 days, then every 6 hours as needed. Qty: 30 tablet, Refills: 0      CONTINUE these medications which have CHANGED   Details  furosemide (LASIX) 40 MG tablet Take 1 tablet (40 mg total) by mouth daily. Resume  on 05/11/2013 Qty: 60 tablet, Refills: 0    Insulin Detemir (LEVEMIR FLEXTOUCH) 100 UNIT/ML Pen Inject 30 Units into the skin every morning. Qty: 15 mL, Refills: 11    losartan (COZAAR) 50 MG tablet Take 1 tablet (50 mg total) by mouth daily. Resume on 05/12/2015 Qty: 30 tablet, Refills: 0      CONTINUE these medications which have NOT CHANGED   Details  aspirin 81 MG tablet Take 81 mg by mouth daily.    B-D UF III MINI PEN NEEDLES 31G X 5 MM MISC USE FOR INJECTIONS AS DIRECTED Qty: 100 each, Refills: 1    carvedilol (COREG) 12.5 MG tablet TAKE  (1)  TABLET TWICE A DAY FOR HIGH BLOOD PRESSURE. Qty: 60 tablet, Refills: 2    Cholecalciferol (VITAMIN D3) 5000 UNITS CAPS Take 5,000 Units by mouth daily.     diltiazem (CARDIZEM CD) 240 MG 24 hr capsule TAKE (1) CAPSULE DAILY FOR HIGH BLOOD PRESSURE. Qty: 90 capsule, Refills: 0    hydrALAZINE (APRESOLINE) 25 MG tablet Take 1 tablet (25 mg total) by mouth 3 (three) times daily. Qty: 90 tablet, Refills: 3    LUMIGAN 0.01 % SOLN Place 1 drop into the right eye at bedtime.     Multiple Vitamin (MULTIVITAMIN) tablet Take 1 tablet by mouth daily.      MYRBETRIQ 50 MG TB24 tablet Take 50 mg by mouth daily.     pravastatin (PRAVACHOL) 40 MG tablet TAKE 1 TABLET ONCE DAILY. Qty: 90 tablet, Refills: 2    Probiotic Product (PROBIOTIC DAILY PO) Take 1 tablet by mouth daily.     SIMBRINZA 1-0.2 % SUSP Place 1 drop into both eyes 3 (three) times daily.    TRUE METRIX BLOOD GLUCOSE TEST test strip CHECK BLOOD SUGAR 6 TIMES A DAY. Qty: 200 each, Refills: 2    docusate sodium (COLACE) 100 MG capsule Take 1 capsule (100 mg total) by mouth 2 (two) times daily. Qty: 10 capsule, Refills: 0    hyoscyamine (LEVSIN/SL) 0.125 MG SL tablet Place 1 tablet (0.125 mg total) under the tongue every 4 (four) hours as needed. Qty: 30 tablet, Refills: 0       Allergies  Allergen Reactions  . Ace Inhibitors Other (See Comments) and Cough    Chronic  cough  . Atenolol     Atenolol eye drops  . Demerol Other (See Comments)    Hard to wake up  . Flomax [Tamsulosin Hcl] Other (See Comments)    Low BP  . Morphine And Related Other (See Comments)    hallucinations  . Oxytrol [Oxybutynin] Rash   Follow-up Information    Follow up with [facility].   Specialty:  [place]   Why:  f/u with MD at [facility]   Contact information:   [address] [place] [phone]       The results of significant diagnostics from this hospitalization (including  imaging, microbiology, ancillary and laboratory) are listed below for reference.    Significant Diagnostic Studies: Dg Chest 2 View  05/05/2015  CLINICAL DATA:  Increased weakness causing the patient to fall at home last night, recent URI, persistent cough, congestion, leukocytosis, personal history of diabetes mellitus, CHF, hypertension, stroke, bronchitis, former smoker, initial encounter EXAM: CHEST  2 VIEW COMPARISON:  04/02/2014 FINDINGS: Enlargement of cardiac silhouette. Mildly tortuous thoracic aorta. Mediastinal contours and pulmonary vascularity otherwise normal. RIGHT basilar atelectasis with mild central bronchitic changes. No definite acute infiltrate, pleural effusion or pneumothorax. No acute osseous findings. Minimal degenerative changes thoracic spine with degenerative changes of the RIGHT AC joint and LEFT glenohumeral joint. IMPRESSION: Enlargement of cardiac silhouette. Bronchitic changes with RIGHT basilar atelectasis. Electronically Signed   By: Lavonia Dana M.D.   On: 05/05/2015 09:58   Dg Abd 1 View  05/08/2015  CLINICAL DATA:  Abdominal distention and pain EXAM: ABDOMEN - 1 VIEW COMPARISON:  None. FINDINGS: Scattered large and small bowel gas is noted. No definitive obstructive change is seen. Postsurgical changes are noted. No free air is noted. IMPRESSION: No evidence of focal obstruction. Electronically Signed   By:  Inez Catalina M.D.   On: 05/08/2015 09:25   Ct Head Wo Contrast  05/05/2015  CLINICAL DATA:  Status post fall the night of 05/04/2015. Initial encounter. EXAM: CT HEAD WITHOUT CONTRAST CT CERVICAL SPINE WITHOUT CONTRAST TECHNIQUE: Multidetector CT imaging of the head and cervical spine was performed following the standard protocol without intravenous contrast. Multiplanar CT image reconstructions of the cervical spine were also generated. COMPARISON:  None. FINDINGS: CT HEAD FINDINGS Chronic microvascular ischemic change and mild cortical atrophy are identified. There is no evidence of acute intracranial abnormality including hemorrhage, infarct, mass lesion, mass effect, midline shift or abnormal extra-axial fluid collection. No hydrocephalus or pneumocephalus. The calvarium is intact. Imaged paranasal sinuses and mastoid air cells are clear. CT CERVICAL SPINE FINDINGS No cervical spine fracture is identified. Autologous fusion of the C3-4 and C4-5 levels is identified. There is trace anterolisthesis C3 on C4 and C4 on C5. Marked loss of disc space height is present at C5-6 and C6-7. Multilevel facet arthropathy is noted. Lung apices are clear. IMPRESSION: No acute abnormality head or cervical spine. Chronic microvascular ischemic change and mild atrophy. Cervical spondylosis. Electronically Signed   By: Inge Rise M.D.   On: 05/05/2015 09:15   Ct Cervical Spine Wo Contrast  05/05/2015  CLINICAL DATA:  Status post fall the night of 05/04/2015. Initial encounter. EXAM: CT HEAD WITHOUT CONTRAST CT CERVICAL SPINE WITHOUT CONTRAST TECHNIQUE: Multidetector CT imaging of the head and cervical spine was performed following the standard protocol without intravenous contrast. Multiplanar CT image reconstructions of the cervical spine were also generated. COMPARISON:  None. FINDINGS: CT HEAD FINDINGS Chronic microvascular ischemic change and mild cortical atrophy are identified. There is no evidence of acute  intracranial abnormality including hemorrhage, infarct, mass lesion, mass effect, midline shift or abnormal extra-axial fluid collection. No hydrocephalus or pneumocephalus. The calvarium is intact. Imaged paranasal sinuses and mastoid air cells are clear. CT CERVICAL SPINE FINDINGS No cervical spine fracture is identified. Autologous fusion of the C3-4 and C4-5 levels is identified. There is trace anterolisthesis C3 on C4 and C4 on C5. Marked loss of disc space height is present at C5-6 and C6-7. Multilevel facet arthropathy is noted. Lung apices are clear. IMPRESSION: No acute abnormality head or cervical spine. Chronic microvascular ischemic change and mild atrophy. Cervical spondylosis. Electronically  Signed   By: Inge Rise M.D.   On: 05/05/2015 09:15    Microbiology: Recent Results (from the past 240 hour(s))  MRSA PCR Screening     Status: None   Collection Time: 05/05/15  2:42 PM  Result Value Ref Range Status   MRSA by PCR NEGATIVE NEGATIVE Final    Comment:        The GeneXpert MRSA Assay (FDA approved for NASAL specimens only), is one component of a comprehensive MRSA colonization surveillance program. It is not intended to diagnose MRSA infection nor to guide or monitor treatment for MRSA infections.   Culture, blood (routine x 2)     Status: None (Preliminary result)   Collection Time: 05/06/15  6:25 PM  Result Value Ref Range Status   Specimen Description BLOOD RIGHT ARM  Final   Special Requests BOTTLES DRAWN AEROBIC AND ANAEROBIC  10CC  Final   Culture   Final    NO GROWTH 2 DAYS Performed at [facility]    Report Status PENDING  Incomplete  Culture, blood (routine x 2)     Status: None (Preliminary result)   Collection Time: 05/06/15  6:30 PM  Result Value Ref Range Status   Specimen Description BLOOD RIGHT HAND  Final   Special Requests BOTTLES DRAWN AEROBIC AND ANAEROBIC  5CC  Final   Culture   Final    NO GROWTH 2 DAYS Performed at [facility]    Report Status PENDING  Incomplete  Culture, Urine     Status: None   Collection Time: 05/06/15  9:49 PM  Result Value Ref Range Status   Specimen Description URINE, RANDOM BAG  Final   Special Requests NONE  Final   Culture   Final    1,000 COLONIES/mL INSIGNIFICANT GROWTH Performed at [facility]    Report Status 05/08/2015 FINAL  Final     Labs: Basic Metabolic Panel:  Recent Labs Lab 05/05/15 1313 05/06/15 0417 05/07/15 0708 05/08/15 0417 05/09/15 0515  NA 140 138 139 139 143  K 3.8 3.4* 3.8 4.1 3.8  CL 106 109 110 112* 113*  CO2 26 19* 19* 20* 21*  GLUCOSE 132* 164* 265* 207* 99  BUN 50* 51* 54* 48* 42*  CREATININE 2.83* 2.67* 2.44* 2.12* 1.85*  CALCIUM 8.7* 8.0* 8.2* 8.0* 8.4*  MG 1.9  --  2.1 2.1  --   PHOS 3.4  --   --   --   --    Liver Function Tests:  Recent Labs Lab 05/05/15 0750 05/05/15 1313 05/06/15 0417 05/07/15 0708  AST 180* 222* 156* 87*  ALT 51 63 58 60  ALKPHOS 60 60 53 63  BILITOT 0.8 1.0 0.7 0.8  PROT 7.2 6.9 6.0* 6.2*  ALBUMIN 3.8 3.6 3.0* 2.7*   No results for input(s): LIPASE, AMYLASE in the last 168 hours. No results for input(s): AMMONIA in the last 168 hours. CBC:  Recent Labs Lab 05/05/15 1313 05/06/15 0417 05/07/15 0708 05/08/15 0417 05/09/15 0515  WBC 14.9* 12.4* 13.0* 14.4* 13.6*  NEUTROABS 12.9*  --   --   --   --   HGB 12.8* 11.8* 11.9* 11.4* 11.5*  HCT 39.5 36.6* 36.6* 35.0* 34.8*  MCV 90.2 90.1 90.4 89.7 89.9  PLT 130* 122* 118* 140* 150   Cardiac Enzymes:  Recent Labs Lab 05/05/15 0750 05/05/15 1313 05/05/15 1820 05/06/15 05/06/15 0417 05/06/15 1908 05/07/15 0105 05/07/15 0648 05/07/15 0708 05/09/15 0515  CKTOTAL 7460* 8727*  --   --  3815*  --   --   --  1457* 277  TROPONINI 0.11* 0.11* 0.12* 0.12*  --  0.07* 0.07* 0.06*  --   --    BNP: BNP (last 3 results) No results for input(s): BNP in the last 8760 hours.  ProBNP (last 3 results) No results for input(s): PROBNP in  the last 8760 hours.  CBG:  Recent Labs Lab 05/08/15 1152 05/08/15 1640 05/08/15 2155 05/09/15 0742 05/09/15 1130  GLUCAP 179* 242* 127* 75 138*       Signed:  Velna Hedgecock  MD [facility] 05/09/2015, 12:12 PM

## 2015-05-11 LAB — CULTURE, BLOOD (ROUTINE X 2)
CULTURE: NO GROWTH
CULTURE: NO GROWTH

## 2015-05-14 ENCOUNTER — Non-Acute Institutional Stay (SKILLED_NURSING_FACILITY): Payer: Medicare PPO | Admitting: Internal Medicine

## 2015-05-14 DIAGNOSIS — IMO0001 Reserved for inherently not codable concepts without codable children: Secondary | ICD-10-CM

## 2015-05-14 DIAGNOSIS — E785 Hyperlipidemia, unspecified: Secondary | ICD-10-CM

## 2015-05-14 DIAGNOSIS — I509 Heart failure, unspecified: Secondary | ICD-10-CM | POA: Diagnosis not present

## 2015-05-14 DIAGNOSIS — R531 Weakness: Secondary | ICD-10-CM

## 2015-05-14 DIAGNOSIS — D72829 Elevated white blood cell count, unspecified: Secondary | ICD-10-CM | POA: Diagnosis not present

## 2015-05-14 DIAGNOSIS — E119 Type 2 diabetes mellitus without complications: Secondary | ICD-10-CM

## 2015-05-14 DIAGNOSIS — N179 Acute kidney failure, unspecified: Secondary | ICD-10-CM

## 2015-05-14 DIAGNOSIS — I10 Essential (primary) hypertension: Secondary | ICD-10-CM | POA: Diagnosis not present

## 2015-05-14 DIAGNOSIS — E1169 Type 2 diabetes mellitus with other specified complication: Secondary | ICD-10-CM | POA: Diagnosis not present

## 2015-05-14 DIAGNOSIS — N189 Chronic kidney disease, unspecified: Secondary | ICD-10-CM

## 2015-05-14 DIAGNOSIS — Z794 Long term (current) use of insulin: Secondary | ICD-10-CM

## 2015-05-14 DIAGNOSIS — R600 Localized edema: Secondary | ICD-10-CM

## 2015-05-14 DIAGNOSIS — N390 Urinary tract infection, site not specified: Secondary | ICD-10-CM | POA: Diagnosis not present

## 2015-05-14 DIAGNOSIS — D631 Anemia in chronic kidney disease: Secondary | ICD-10-CM

## 2015-05-14 DIAGNOSIS — K5900 Constipation, unspecified: Secondary | ICD-10-CM | POA: Diagnosis not present

## 2015-05-14 LAB — HEPATIC FUNCTION PANEL
ALT: 34 U/L (ref 10–40)
AST: 25 U/L (ref 14–40)
Alkaline Phosphatase: 64 U/L (ref 25–125)
Bilirubin, Total: 0.4 mg/dL

## 2015-05-14 LAB — BASIC METABOLIC PANEL
BUN: 26 mg/dL — AB (ref 4–21)
Creatinine: 1.9 mg/dL — AB (ref <=1.3)
GLUCOSE: 120 mg/dL
POTASSIUM: 3.8 mmol/L (ref 3.4–5.3)
SODIUM: 143 mmol/L (ref 137–147)

## 2015-05-14 LAB — CBC AND DIFFERENTIAL
HCT: 34 % — AB (ref 41–53)
HEMOGLOBIN: 10.8 g/dL — AB (ref 13.5–17.5)
Platelets: 331 10*3/uL (ref 150–399)
WBC: 10.3 10^3/mL

## 2015-05-14 NOTE — Progress Notes (Signed)
Patient ID: Gregory Hatfield, male   DOB: 1926-03-26, 81 y.o.   MRN: [id number]     Facility: [facility]    PCP: Alesia Richards, MD   Allergies  Allergen Reactions  . Ace Inhibitors Other (See Comments) and Cough    Chronic cough  . Atenolol     Atenolol eye drops  . Demerol Other (See Comments)    Hard to wake up  . Flomax [Tamsulosin Hcl] Other (See Comments)    Low BP  . Morphine And Related Other (See Comments)    hallucinations  . Oxytrol [Oxybutynin] Rash    Chief Complaint  Patient presents with  . New Admit To SNF     HPI:  81 y.o. patient is here for short term rehabilitation post hospital admission from 05/05/15-05/09/15 after a fall with altered mental status. He was diagnosed to have a UTI and bronchitis and was started on rocephin. He was later transitioned to oral ceftin. He also had elevated CK with concerns of rhabdomylosis post fall and was given iv fluids. He had acute on chronic renal failure and responded well to iv fluids.  He has PMH of HTN, DM, dementia, CKD 4, HLD among others. He is seen in his room today with his daughter present. He has been working with therapy team and feels to be making improvement.   Review of Systems:  Constitutional: positive for fatigue. Negative for fever, chills, diaphoresis.  HENT: Negative for headache, congestion, nasal discharge, difficulty swallowing.   Eyes: Negative for eye pain, blurred vision, double vision and discharge.  Respiratory: positive for cough. Negative for shortness of breath and wheezing.   Cardiovascular: Negative for chest pain, palpitations. Positive for leg swelling.  Gastrointestinal: Negative for heartburn, nausea, vomiting, abdominal pain. Had bowel mpvement this am but a small one and had to strain and stool was somewhat loose Genitourinary: Negative for dysuria, flank pain.  Musculoskeletal: Negative for back pain, falls. Skin: Negative for itching, rash.    Neurological: Negative for dizziness, tingling, focal weakness Psychiatric/Behavioral: Negative for depression    No past medical history on file. Past Surgical History  Procedure Laterality Date  . Cardiac catheterization  04/2004    normal r-sided pressure, mild pulm HTN  . Prostate surgery  10/2010    transurethral resection   . Transthoracic echocardiogram  05/2006    EF 60-70%; mild calcif of MV; mild MV regurg; LA mildly dilated  . Carpal tunnel release Right   . Lumbar spine surgery  2003  . Cervical spine surgery  1990's  . Total hip arthroplasty Left 01/17/2014    Procedure: LEFT TOTAL HIP ARTHROPLASTY;  Surgeon: Yvette Rack., MD;  Location: [facility];  Service: Orthopedics;  Laterality: Left;  . Transurethral resection of prostate N/A 08/22/2014    Procedure: TRANSURETHRAL RESECTION OF THE PROSTATE (TURP);  Surgeon: Marcia Brash, MD;  Location: [facility];  Service: Urology;  Laterality: N/A;   Social History:   reports that he quit smoking about 31 years ago. He has never used smokeless tobacco. He reports that he does not drink alcohol or use illicit drugs.  Family History  Problem Relation Age of Onset  . Diabetes Mother   . Hypertension Mother   . Prostate cancer Father   . Heart disease Father   . Diabetes Brother   . Cancer Brother   . Heart disease Brother   . Diabetes Sister   . Heart disease Sister   . Hyperlipidemia  Child   . Hypertension Child   . Diabetes Child   . Heart attack Child   . Cancer Child     Medications:   Medication List       This list is accurate as of: 05/14/15  9:18 AM.  Always use your most recent med list.               aspirin 81 MG tablet  Take 81 mg by mouth daily.     B-D UF III MINI PEN NEEDLES 31G X 5 MM Misc  Generic drug:  Insulin Pen Needle  USE FOR INJECTIONS AS DIRECTED     benzonatate 100 MG capsule  Commonly known as:  TESSALON PERLES  Take 2 capsules (200 mg total) by mouth 3 (three) times daily. Take  for 5 days then stop.     carvedilol 12.5 MG tablet  Commonly known as:  COREG  TAKE  (1)  TABLET TWICE A DAY FOR HIGH BLOOD PRESSURE.     cefUROXime 500 MG tablet  Commonly known as:  CEFTIN  Take 1 tablet (500 mg total) by mouth 2 (two) times daily with a meal. Take for 4 days, then stop.     diltiazem 240 MG 24 hr capsule  Commonly known as:  CARDIZEM CD  TAKE (1) CAPSULE DAILY FOR HIGH BLOOD PRESSURE.     docusate sodium 100 MG capsule  Commonly known as:  COLACE  Take 1 capsule (100 mg total) by mouth 2 (two) times daily.     furosemide 40 MG tablet  Commonly known as:  LASIX  Take 1 tablet (40 mg total) by mouth daily. Resume on 05/11/2013     hydrALAZINE 25 MG tablet  Commonly known as:  APRESOLINE  Take 1 tablet (25 mg total) by mouth 3 (three) times daily.     hyoscyamine 0.125 MG SL tablet  Commonly known as:  LEVSIN/SL  Place 1 tablet (0.125 mg total) under the tongue every 4 (four) hours as needed.     Insulin Detemir 100 UNIT/ML Pen  Commonly known as:  LEVEMIR FLEXTOUCH  Inject 30 Units into the skin every morning.     losartan 50 MG tablet  Commonly known as:  COZAAR  Take 1 tablet (50 mg total) by mouth daily. Resume on 05/12/2015     LUMIGAN 0.01 % Soln  Generic drug:  bimatoprost  Place 1 drop into the right eye at bedtime.     multivitamin tablet  Take 1 tablet by mouth daily.     MYRBETRIQ 50 MG Tb24 tablet  Generic drug:  mirabegron ER  Take 50 mg by mouth daily.     polyethylene glycol packet  Commonly known as:  MIRALAX / GLYCOLAX  Take 17 g by mouth 2 (two) times daily.     pravastatin 40 MG tablet  Commonly known as:  PRAVACHOL  TAKE 1 TABLET ONCE DAILY.     PROBIOTIC DAILY PO  Take 1 tablet by mouth daily.     senna-docusate 8.6-50 MG tablet  Commonly known as:  Senokot-S  Take 1 tablet by mouth at bedtime.     SIMBRINZA 1-0.2 % Susp  Generic drug:  Brinzolamide-Brimonidine  Place 1 drop into both eyes 3 (three) times daily.       simethicone 80 MG chewable tablet  Commonly known as:  MYLICON  Chew 2 tablets (160 mg total) by mouth 4 (four) times daily. Use for 4 days, then every 6 hours as needed.  TRUE METRIX BLOOD GLUCOSE TEST test strip  Generic drug:  glucose blood  CHECK BLOOD SUGAR 6 TIMES A DAY.     Vitamin D3 5000 UNITS Caps  Take 5,000 Units by mouth daily.         Physical Exam: Filed Vitals:   05/14/15 0917  BP: 148/72  Pulse: 65  Temp: 98.3 F (36.8 C)  Resp: 18  SpO2: 95%    General- elderly male, well built, in no acute distress Head- normocephalic, atraumatic Nose- normal nasal mucosa, no maxillary or frontal sinus tenderness, no nasal discharge Throat- moist mucus membrane  Eyes- PERRLA, EOMI, no pallor, no icterus, no discharge, normal conjunctiva, normal sclera Neck- no cervical lymphadenopathy Cardiovascular- normal s1,s2, no murmurs, palpable dorsalis pedis and radial pulses, 1+ leg edema Respiratory- bilateral clear to auscultation, no wheeze, no rhonchi, no crackles, no use of accessory muscles Abdomen- bowel sounds present, soft, non tender but distended Musculoskeletal- able to move all 4 extremities, generalized weakness, unsteady gait and on a wheelchair Neurological- no focal deficit, alert and oriented to person, place and time Skin- warm and dry Psychiatry- normal mood and affect    Labs reviewed: Basic Metabolic Panel:  Recent Labs  05/05/15 1313  05/07/15 0708 05/08/15 0417 05/09/15 0515  NA 140  < > 139 139 143  K 3.8  < > 3.8 4.1 3.8  CL 106  < > 110 112* 113*  CO2 26  < > 19* 20* 21*  GLUCOSE 132*  < > 265* 207* 99  BUN 50*  < > 54* 48* 42*  CREATININE 2.83*  < > 2.44* 2.12* 1.85*  CALCIUM 8.7*  < > 8.2* 8.0* 8.4*  MG 1.9  --  2.1 2.1  --   PHOS 3.4  --   --   --   --   < > = values in this interval not displayed. Liver Function Tests:  Recent Labs  05/05/15 1313 05/06/15 0417 05/07/15 0708  AST 222* 156* 87*  ALT 63 58 60   ALKPHOS 60 53 63  BILITOT 1.0 0.7 0.8  PROT 6.9 6.0* 6.2*  ALBUMIN 3.6 3.0* 2.7*   No results for input(s): LIPASE, AMYLASE in the last 8760 hours. No results for input(s): AMMONIA in the last 8760 hours. CBC:  Recent Labs  12/18/14 1704 03/30/15 1640  05/05/15 1313  05/07/15 0708 05/08/15 0417 05/09/15 0515  WBC 5.9 5.9  < > 14.9*  < > 13.0* 14.4* 13.6*  NEUTROABS 2.8 2.7  --  12.9*  --   --   --   --   HGB 12.9* 12.8*  < > 12.8*  < > 11.9* 11.4* 11.5*  HCT 39.4 38.2*  < > 39.5  < > 36.6* 35.0* 34.8*  MCV 89.7 87.6  < > 90.2  < > 90.4 89.7 89.9  PLT 173 158  < > 130*  < > 118* 140* 150  < > = values in this interval not displayed. Cardiac Enzymes:  Recent Labs  05/06/15 0417 05/06/15 1908 05/07/15 0105 05/07/15 0648 05/07/15 0708 05/09/15 0515  CKTOTAL 3815*  --   --   --  1457* 277  TROPONINI  --  0.07* 0.07* 0.06*  --   --    BNP: Invalid input(s): POCBNP CBG:  Recent Labs  05/08/15 2155 05/09/15 0742 05/09/15 1130  GLUCAP 127* 75 138*    Radiological Exams: Dg Chest 2 View  05/05/2015  CLINICAL DATA:  Increased weakness causing the patient to  fall at home last night, recent URI, persistent cough, congestion, leukocytosis, personal history of diabetes mellitus, CHF, hypertension, stroke, bronchitis, former smoker, initial encounter EXAM: CHEST  2 VIEW COMPARISON:  04/02/2014 FINDINGS: Enlargement of cardiac silhouette. Mildly tortuous thoracic aorta. Mediastinal contours and pulmonary vascularity otherwise normal. RIGHT basilar atelectasis with mild central bronchitic changes. No definite acute infiltrate, pleural effusion or pneumothorax. No acute osseous findings. Minimal degenerative changes thoracic spine with degenerative changes of the RIGHT AC joint and LEFT glenohumeral joint. IMPRESSION: Enlargement of cardiac silhouette. Bronchitic changes with RIGHT basilar atelectasis. Electronically Signed   By: Lavonia Dana M.D.   On: 05/05/2015 09:58   Ct Head  Wo Contrast  05/05/2015  CLINICAL DATA:  Status post fall the night of 05/04/2015. Initial encounter. EXAM: CT HEAD WITHOUT CONTRAST CT CERVICAL SPINE WITHOUT CONTRAST TECHNIQUE: Multidetector CT imaging of the head and cervical spine was performed following the standard protocol without intravenous contrast. Multiplanar CT image reconstructions of the cervical spine were also generated. COMPARISON:  None. FINDINGS: CT HEAD FINDINGS Chronic microvascular ischemic change and mild cortical atrophy are identified. There is no evidence of acute intracranial abnormality including hemorrhage, infarct, mass lesion, mass effect, midline shift or abnormal extra-axial fluid collection. No hydrocephalus or pneumocephalus. The calvarium is intact. Imaged paranasal sinuses and mastoid air cells are clear. CT CERVICAL SPINE FINDINGS No cervical spine fracture is identified. Autologous fusion of the C3-4 and C4-5 levels is identified. There is trace anterolisthesis C3 on C4 and C4 on C5. Marked loss of disc space height is present at C5-6 and C6-7. Multilevel facet arthropathy is noted. Lung apices are clear. IMPRESSION: No acute abnormality head or cervical spine. Chronic microvascular ischemic change and mild atrophy. Cervical spondylosis. Electronically Signed   By: Inge Rise M.D.   On: 05/05/2015 09:15   Ct Cervical Spine Wo Contrast  05/05/2015  CLINICAL DATA:  Status post fall the night of 05/04/2015. Initial encounter. EXAM: CT HEAD WITHOUT CONTRAST CT CERVICAL SPINE WITHOUT CONTRAST TECHNIQUE: Multidetector CT imaging of the head and cervical spine was performed following the standard protocol without intravenous contrast. Multiplanar CT image reconstructions of the cervical spine were also generated. COMPARISON:  None. FINDINGS: CT HEAD FINDINGS Chronic microvascular ischemic change and mild cortical atrophy are identified. There is no evidence of acute intracranial abnormality including hemorrhage, infarct,  mass lesion, mass effect, midline shift or abnormal extra-axial fluid collection. No hydrocephalus or pneumocephalus. The calvarium is intact. Imaged paranasal sinuses and mastoid air cells are clear. CT CERVICAL SPINE FINDINGS No cervical spine fracture is identified. Autologous fusion of the C3-4 and C4-5 levels is identified. There is trace anterolisthesis C3 on C4 and C4 on C5. Marked loss of disc space height is present at C5-6 and C6-7. Multilevel facet arthropathy is noted. Lung apices are clear. IMPRESSION: No acute abnormality head or cervical spine. Chronic microvascular ischemic change and mild atrophy. Cervical spondylosis. Electronically Signed   By: Inge Rise M.D.   On: 05/05/2015 09:15    Assessment/Plan  Generalized weakness Will have him work with physical therapy and occupational therapy team to help with gait training and muscle strengthening exercises.fall precautions. Skin care. Encourage to be out of bed.   Urinary tract infection Currently asymptomatic. Hydration to be maintained. Will complete his ceftin course today.   Acute on chronic renal failure Monitor renal function, has received iv fluids in hospital. With him on lasix and losartan, monitor bmp  Leukocytosis To complete his ceftin today. Remains afebrile. Check  cbc with diff 05/15/15  HTN Elevated BP reading. Monitor bp daily for now and adjust medication if needed. Continue coreg 12.5 mg bid, cardizem 240 mg daily, hydralazine 25 mg tid, losartan 50 mg daily and aspirin.  Constipation Currently on miralax bid and senna s daily. With loose stool, change miralax to daily prn only and add colace 100 mg bid.  CHF Monitor clinically, daily weight check. Continue coreg, lasix 40 mg daily, hydralazine and losartan. Monitor bmp  Dm  Lab Results  Component Value Date   HGBA1C 8.2* 05/05/2015  monitor cbg, continue lantus 30 u sq daily with aspirin and statin   Leg edema Keep legs elevated at rest. Add  ted hose and monitor daily weight for now  HLD Continue pravachol 40 mg daily  Anemia of chronic renal disease Monitor h&h   Goals of care: short term rehabilitation   Labs/tests ordered: cbc, cmp 05/15/15  Family/ staff Communication: reviewed care plan with patient, his daughter and nursing supervisor    Blanchie Serve, MD  [place] [phone] (Monday-Friday 8 am - 5 pm) [phone] (afterhours)

## 2015-05-22 ENCOUNTER — Encounter: Payer: Self-pay | Admitting: Nurse Practitioner

## 2015-05-22 ENCOUNTER — Non-Acute Institutional Stay (SKILLED_NURSING_FACILITY): Payer: Medicare PPO | Admitting: Nurse Practitioner

## 2015-05-22 DIAGNOSIS — R600 Localized edema: Secondary | ICD-10-CM | POA: Diagnosis not present

## 2015-05-22 DIAGNOSIS — N189 Chronic kidney disease, unspecified: Secondary | ICD-10-CM

## 2015-05-22 DIAGNOSIS — E1169 Type 2 diabetes mellitus with other specified complication: Secondary | ICD-10-CM | POA: Diagnosis not present

## 2015-05-22 DIAGNOSIS — D631 Anemia in chronic kidney disease: Secondary | ICD-10-CM | POA: Diagnosis not present

## 2015-05-22 DIAGNOSIS — Z794 Long term (current) use of insulin: Secondary | ICD-10-CM

## 2015-05-22 DIAGNOSIS — E785 Hyperlipidemia, unspecified: Secondary | ICD-10-CM

## 2015-05-22 DIAGNOSIS — I509 Heart failure, unspecified: Secondary | ICD-10-CM

## 2015-05-22 DIAGNOSIS — N390 Urinary tract infection, site not specified: Secondary | ICD-10-CM | POA: Diagnosis not present

## 2015-05-22 DIAGNOSIS — E119 Type 2 diabetes mellitus without complications: Secondary | ICD-10-CM | POA: Diagnosis not present

## 2015-05-22 DIAGNOSIS — I10 Essential (primary) hypertension: Secondary | ICD-10-CM | POA: Diagnosis not present

## 2015-05-22 DIAGNOSIS — N179 Acute kidney failure, unspecified: Secondary | ICD-10-CM | POA: Diagnosis not present

## 2015-05-22 DIAGNOSIS — IMO0001 Reserved for inherently not codable concepts without codable children: Secondary | ICD-10-CM

## 2015-05-22 NOTE — Progress Notes (Signed)
Nursing Home Location:  [facility]   Place of Service: SNF (69)  PCP: Alesia Richards, MD  Allergies  Allergen Reactions  . Ace Inhibitors Other (See Comments) and Cough    Chronic cough  . Atenolol     Atenolol eye drops  . Demerol Other (See Comments)    Hard to wake up  . Flomax [Tamsulosin Hcl] Other (See Comments)    Low BP  . Morphine And Related Other (See Comments)    hallucinations  . Oxytrol [Oxybutynin] Rash    Chief Complaint  Patient presents with  . Discharge Note    HPI:  Patient is a 81 y.o. male seen today at [facility] for discharge home.  He has PMH of HTN, DM, dementia, CKD 4, HLD. Pt at [facility] for short term rehabilitation after hospitalization from 05/05/15-05/09/15 after a fall with altered mental status. He was diagnosed to have a UTI and bronchitis. Pt completed oral course of ceftin. He also had elevated CK with concerns of rhabdomylosis post fall and was given IV fluids and responded well.   Review of Systems:  Review of Systems  Constitutional: Negative for activity change, appetite change, fatigue and unexpected weight change.  HENT: Negative for congestion and hearing loss.   Eyes: Negative.   Respiratory: Negative for cough and shortness of breath.   Cardiovascular: Negative for chest pain, palpitations and leg swelling.  Gastrointestinal: Negative for abdominal pain, diarrhea and constipation.  Genitourinary: Negative for dysuria and difficulty urinating.  Musculoskeletal: Negative for myalgias and arthralgias.  Skin: Negative for color change and wound.  Neurological: Negative for dizziness and weakness.  Psychiatric/Behavioral: Negative for behavioral problems, confusion and agitation.    No past medical history on file. Past Surgical History  Procedure Laterality Date  . Cardiac catheterization  04/2004    normal r-sided pressure, mild pulm HTN  . Prostate surgery  10/2010   transurethral resection   . Transthoracic echocardiogram  05/2006    EF 60-70%; mild calcif of MV; mild MV regurg; LA mildly dilated  . Carpal tunnel release Right   . Lumbar spine surgery  2003  . Cervical spine surgery  1990's  . Total hip arthroplasty Left 01/17/2014    Procedure: LEFT TOTAL HIP ARTHROPLASTY;  Surgeon: Yvette Rack., MD;  Location: [place];  Service: Orthopedics;  Laterality: Left;  . Transurethral resection of prostate N/A 08/22/2014    Procedure: TRANSURETHRAL RESECTION OF THE PROSTATE (TURP);  Surgeon: Marcia Brash, MD;  Location: [facility];  Service: Urology;  Laterality: N/A;   Social History:   reports that he quit smoking about 31 years ago. He has never used smokeless tobacco. He reports that he does not drink alcohol or use illicit drugs.  Family History  Problem Relation Age of Onset  . Diabetes Mother   . Hypertension Mother   . Prostate cancer Father   . Heart disease Father   . Diabetes Brother   . Cancer Brother   . Heart disease Brother   . Diabetes Sister   . Heart disease Sister   . Hyperlipidemia Child   . Hypertension Child   . Diabetes Child   . Heart attack Child   . Cancer Child     Medications: Patient's Medications  New Prescriptions   No medications on file  Previous Medications   ASPIRIN 81 MG TABLET    Take 81 mg by mouth daily.   B-D UF  III MINI PEN NEEDLES 31G X 5 MM MISC    USE FOR INJECTIONS AS DIRECTED   BENZONATATE (TESSALON PERLES) 100 MG CAPSULE    Take 2 capsules (200 mg total) by mouth 3 (three) times daily. Take for 5 days then stop.   CARVEDILOL (COREG) 12.5 MG TABLET    TAKE  (1)  TABLET TWICE A DAY FOR HIGH BLOOD PRESSURE.       CHOLECALCIFEROL (VITAMIN D3) 5000 UNITS CAPS    Take 5,000 Units by mouth daily.    DILTIAZEM (CARDIZEM CD) 240 MG 24 HR CAPSULE    TAKE (1) CAPSULE DAILY FOR HIGH BLOOD PRESSURE.   DOCUSATE SODIUM (COLACE) 100 MG CAPSULE    Take 1 capsule (100 mg total) by mouth 2 (two) times daily.    FUROSEMIDE (LASIX) 40 MG TABLET    Take 1 tablet (40 mg total) by mouth daily. Resume on 05/11/2013   HYDRALAZINE (APRESOLINE) 25 MG TABLET    Take 1 tablet (25 mg total) by mouth 3 (three) times daily.   HYOSCYAMINE (LEVSIN/SL) 0.125 MG SL TABLET    Place 1 tablet (0.125 mg total) under the tongue every 4 (four) hours as needed.   INSULIN DETEMIR (LEVEMIR FLEXTOUCH) 100 UNIT/ML PEN    Inject 30 Units into the skin every morning.   LOSARTAN (COZAAR) 50 MG TABLET    Take 1 tablet (50 mg total) by mouth daily. Resume on 05/12/2015   LUMIGAN 0.01 % SOLN    Place 1 drop into the right eye at bedtime.    MULTIPLE VITAMIN (MULTIVITAMIN) TABLET    Take 1 tablet by mouth daily.     MYRBETRIQ 50 MG TB24 TABLET    Take 50 mg by mouth daily.    POLYETHYLENE GLYCOL (MIRALAX / GLYCOLAX) PACKET    Take 17 g by mouth 2 (two) times daily.   PRAVASTATIN (PRAVACHOL) 40 MG TABLET    TAKE 1 TABLET ONCE DAILY.   PROBIOTIC PRODUCT (PROBIOTIC DAILY PO)    Take 1 tablet by mouth daily.    SENNA-DOCUSATE (SENOKOT-S) 8.6-50 MG TABLET    Take 1 tablet by mouth at bedtime.   SIMBRINZA 1-0.2 % SUSP    Place 1 drop into both eyes 3 (three) times daily.   SIMETHICONE (MYLICON) 80 MG CHEWABLE TABLET    Chew 2 tablets (160 mg total) by mouth 4 (four) times daily. Use for 4 days, then every 6 hours as needed.   TRUE METRIX BLOOD GLUCOSE TEST TEST STRIP    CHECK BLOOD SUGAR 6 TIMES A DAY.  Modified Medications   No medications on file  Discontinued Medications   No medications on file     Physical Exam: Filed Vitals:   05/22/15 1047  BP: 147/70  Pulse: 68  Temp: 97.8 F (36.6 C)  Resp: 18  Height: 6' (1.829 m)  Weight: 224 lb 13.9 oz (102 kg)    Physical Exam  Constitutional: He is oriented to person, place, and time. He appears well-developed and well-nourished. No distress.  HENT:  Head: Normocephalic and atraumatic.  Mouth/Throat: Oropharynx is clear and moist. No oropharyngeal exudate.  Eyes: Conjunctivae  and EOM are normal. Pupils are equal, round, and reactive to light.  Neck: Normal range of motion. Neck supple.  Cardiovascular: Normal rate, regular rhythm and normal heart sounds.   Pulmonary/Chest: Effort normal and breath sounds normal.  Abdominal: Soft. Bowel sounds are normal.  Musculoskeletal: He exhibits no tenderness. Edema: +1 bilateral LE edema.  Neurological:  He is alert and oriented to person, place, and time.  Skin: Skin is warm and dry. He is not diaphoretic.  Psychiatric: He has a normal mood and affect.    Labs reviewed: Basic Metabolic Panel:  Recent Labs  05/05/15 1313  05/07/15 0708 05/08/15 0417 05/09/15 0515 05/14/15  NA 140  < > 139 139 143 143  K 3.8  < > 3.8 4.1 3.8 3.8  CL 106  < > 110 112* 113*  --   CO2 26  < > 19* 20* 21*  --   GLUCOSE 132*  < > 265* 207* 99  --   BUN 50*  < > 54* 48* 42* 26*  CREATININE 2.83*  < > 2.44* 2.12* 1.85* 1.9*  CALCIUM 8.7*  < > 8.2* 8.0* 8.4*  --   MG 1.9  --  2.1 2.1  --   --   PHOS 3.4  --   --   --   --   --   < > = values in this interval not displayed. Liver Function Tests:  Recent Labs  05/05/15 1313 05/06/15 0417 05/07/15 0708 05/14/15  AST 222* 156* 87* 25  ALT 63 58 60 34  ALKPHOS 60 53 63 64  BILITOT 1.0 0.7 0.8  --   PROT 6.9 6.0* 6.2*  --   ALBUMIN 3.6 3.0* 2.7*  --    No results for input(s): LIPASE, AMYLASE in the last 8760 hours. No results for input(s): AMMONIA in the last 8760 hours. CBC:  Recent Labs  12/18/14 1704 03/30/15 1640  05/05/15 1313  05/07/15 0708 05/08/15 0417 05/09/15 0515 05/14/15  WBC 5.9 5.9  < > 14.9*  < > 13.0* 14.4* 13.6* 10.3  NEUTROABS 2.8 2.7  --  12.9*  --   --   --   --   --   HGB 12.9* 12.8*  < > 12.8*  < > 11.9* 11.4* 11.5* 10.8*  HCT 39.4 38.2*  < > 39.5  < > 36.6* 35.0* 34.8* 34*  MCV 89.7 87.6  < > 90.2  < > 90.4 89.7 89.9  --   PLT 173 158  < > 130*  < > 118* 140* 150 331  < > = values in this interval not displayed. TSH:  Recent Labs   09/16/14 1734 12/18/14 1704 05/05/15 1313  TSH 1.181 1.315 0.712   A1C: Lab Results  Component Value Date   HGBA1C 8.2* 05/05/2015   Lipid Panel:  Recent Labs  09/16/14 1734 12/18/14 1704 03/30/15 1640  CHOL 154 134 127  HDL 44 43 46  LDLCALC 77 74 66  TRIG 166* 83 75  CHOLHDL 3.5 3.1 2.8    Radiological Exams: Dg Chest 2 View  05/05/2015  CLINICAL DATA:  Increased weakness causing the patient to fall at home last night, recent URI, persistent cough, congestion, leukocytosis, personal history of diabetes mellitus, CHF, hypertension, stroke, bronchitis, former smoker, initial encounter EXAM: CHEST  2 VIEW COMPARISON:  04/02/2014 FINDINGS: Enlargement of cardiac silhouette. Mildly tortuous thoracic aorta. Mediastinal contours and pulmonary vascularity otherwise normal. RIGHT basilar atelectasis with mild central bronchitic changes. No definite acute infiltrate, pleural effusion or pneumothorax. No acute osseous findings. Minimal degenerative changes thoracic spine with degenerative changes of the RIGHT AC joint and LEFT glenohumeral joint. IMPRESSION: Enlargement of cardiac silhouette. Bronchitic changes with RIGHT basilar atelectasis. Electronically Signed   By: Lavonia Dana M.D.   On: 05/05/2015 09:58   Ct Head Wo Contrast  05/05/2015  CLINICAL DATA:  Status post fall the night of 05/04/2015. Initial encounter. EXAM: CT HEAD WITHOUT CONTRAST CT CERVICAL SPINE WITHOUT CONTRAST TECHNIQUE: Multidetector CT imaging of the head and cervical spine was performed following the standard protocol without intravenous contrast. Multiplanar CT image reconstructions of the cervical spine were also generated. COMPARISON:  None. FINDINGS: CT HEAD FINDINGS Chronic microvascular ischemic change and mild cortical atrophy are identified. There is no evidence of acute intracranial abnormality including hemorrhage, infarct, mass lesion, mass effect, midline shift or abnormal extra-axial fluid collection. No  hydrocephalus or pneumocephalus. The calvarium is intact. Imaged paranasal sinuses and mastoid air cells are clear. CT CERVICAL SPINE FINDINGS No cervical spine fracture is identified. Autologous fusion of the C3-4 and C4-5 levels is identified. There is trace anterolisthesis C3 on C4 and C4 on C5. Marked loss of disc space height is present at C5-6 and C6-7. Multilevel facet arthropathy is noted. Lung apices are clear. IMPRESSION: No acute abnormality head or cervical spine. Chronic microvascular ischemic change and mild atrophy. Cervical spondylosis. Electronically Signed   By: Inge Rise M.D.   On: 05/05/2015 09:15   Ct Cervical Spine Wo Contrast  05/05/2015  CLINICAL DATA:  Status post fall the night of 05/04/2015. Initial encounter. EXAM: CT HEAD WITHOUT CONTRAST CT CERVICAL SPINE WITHOUT CONTRAST TECHNIQUE: Multidetector CT imaging of the head and cervical spine was performed following the standard protocol without intravenous contrast. Multiplanar CT image reconstructions of the cervical spine were also generated. COMPARISON:  None. FINDINGS: CT HEAD FINDINGS Chronic microvascular ischemic change and mild cortical atrophy are identified. There is no evidence of acute intracranial abnormality including hemorrhage, infarct, mass lesion, mass effect, midline shift or abnormal extra-axial fluid collection. No hydrocephalus or pneumocephalus. The calvarium is intact. Imaged paranasal sinuses and mastoid air cells are clear. CT CERVICAL SPINE FINDINGS No cervical spine fracture is identified. Autologous fusion of the C3-4 and C4-5 levels is identified. There is trace anterolisthesis C3 on C4 and C4 on C5. Marked loss of disc space height is present at C5-6 and C6-7. Multilevel facet arthropathy is noted. Lung apices are clear. IMPRESSION: No acute abnormality head or cervical spine. Chronic microvascular ischemic change and mild atrophy. Cervical spondylosis. Electronically Signed   By: Inge Rise  M.D.   On: 05/05/2015 09:15    Assessment/Plan 1. Urinary tract infection, site not specified Completed Ceftin, no current symptoms   2. Acute on chronic renal failure (HCC) - BUN and Cr improved during hospitalization, remains stable at this time  3. Essential hypertension, benign Stable conts on coreg 12.5 mg bid, cardizem 240 mg daily, hydralazine 25 mg tid, losartan 50 mg daily and ASA daily.  4. IDDM (insulin dependent diabetes mellitus) (HCC) Stable, conts on levemir 30 units daily   5. Dyslipidemia  (HCC) Stable, remains on pravastatin.   6. Anemia in chronic renal disease hgb at 10.8, no signs of bleeding, will need ongoing outpatient follow up  7. Bilateral edema of lower extremity -trace LE edema, remains stable, cont elevation of LE, TEDs and lsaix  8. Congestive heart failure, unspecified congestive heart failure chronicity, unspecified congestive heart failure type (HCC) Euvolemic, conts on lasix, coreg and losartan.     Carlos American. Harle Battiest  [facility] [phone] 8 am - 5 pm) [phone] (after hours)

## 2015-05-24 DIAGNOSIS — M6281 Muscle weakness (generalized): Secondary | ICD-10-CM | POA: Diagnosis not present

## 2015-05-24 DIAGNOSIS — N184 Chronic kidney disease, stage 4 (severe): Secondary | ICD-10-CM | POA: Diagnosis not present

## 2015-05-24 DIAGNOSIS — I509 Heart failure, unspecified: Secondary | ICD-10-CM | POA: Diagnosis not present

## 2015-05-24 DIAGNOSIS — I130 Hypertensive heart and chronic kidney disease with heart failure and stage 1 through stage 4 chronic kidney disease, or unspecified chronic kidney disease: Secondary | ICD-10-CM | POA: Diagnosis not present

## 2015-05-24 DIAGNOSIS — R2689 Other abnormalities of gait and mobility: Secondary | ICD-10-CM | POA: Diagnosis not present

## 2015-05-24 DIAGNOSIS — E1029 Type 1 diabetes mellitus with other diabetic kidney complication: Secondary | ICD-10-CM | POA: Diagnosis not present

## 2015-06-02 ENCOUNTER — Telehealth: Payer: Self-pay | Admitting: *Deleted

## 2015-06-02 NOTE — Telephone Encounter (Signed)
Per Dr Melford Aase, it is OK for OT 1 time a week for 4 weeks.  Marlowe Kays, at [facility], is aware.

## 2015-06-03 ENCOUNTER — Encounter: Payer: Self-pay | Admitting: Internal Medicine

## 2015-06-03 ENCOUNTER — Ambulatory Visit (INDEPENDENT_AMBULATORY_CARE_PROVIDER_SITE_OTHER): Payer: Medicare PPO | Admitting: Internal Medicine

## 2015-06-03 VITALS — BP 136/60 | HR 76 | Temp 97.5°F | Resp 16 | Ht 71.0 in | Wt 220.2 lb

## 2015-06-03 DIAGNOSIS — Z79899 Other long term (current) drug therapy: Secondary | ICD-10-CM | POA: Diagnosis not present

## 2015-06-03 DIAGNOSIS — I150 Renovascular hypertension: Secondary | ICD-10-CM

## 2015-06-03 DIAGNOSIS — E559 Vitamin D deficiency, unspecified: Secondary | ICD-10-CM | POA: Insufficient documentation

## 2015-06-03 DIAGNOSIS — E1022 Type 1 diabetes mellitus with diabetic chronic kidney disease: Secondary | ICD-10-CM

## 2015-06-03 DIAGNOSIS — N184 Chronic kidney disease, stage 4 (severe): Secondary | ICD-10-CM

## 2015-06-03 DIAGNOSIS — N390 Urinary tract infection, site not specified: Secondary | ICD-10-CM

## 2015-06-03 DIAGNOSIS — N183 Chronic kidney disease, stage 3 (moderate): Secondary | ICD-10-CM | POA: Diagnosis not present

## 2015-06-03 DIAGNOSIS — E1122 Type 2 diabetes mellitus with diabetic chronic kidney disease: Secondary | ICD-10-CM | POA: Insufficient documentation

## 2015-06-03 HISTORY — DX: Type 2 diabetes mellitus with diabetic chronic kidney disease: E11.22

## 2015-06-03 LAB — CBC WITH DIFFERENTIAL/PLATELET
BASOS PCT: 0 % (ref 0–1)
Basophils Absolute: 0 10*3/uL (ref 0.0–0.1)
EOS ABS: 0.2 10*3/uL (ref 0.0–0.7)
EOS PCT: 2 % (ref 0–5)
HCT: 31.2 % — ABNORMAL LOW (ref 39.0–52.0)
Hemoglobin: 9.9 g/dL — ABNORMAL LOW (ref 13.0–17.0)
Lymphocytes Relative: 20 % (ref 12–46)
Lymphs Abs: 2.2 10*3/uL (ref 0.7–4.0)
MCH: 28.5 pg (ref 26.0–34.0)
MCHC: 31.7 g/dL (ref 30.0–36.0)
MCV: 89.9 fL (ref 78.0–100.0)
MONO ABS: 1.1 10*3/uL — AB (ref 0.1–1.0)
MONOS PCT: 10 % (ref 3–12)
MPV: 10.5 fL (ref 8.6–12.4)
Neutro Abs: 7.3 10*3/uL (ref 1.7–7.7)
Neutrophils Relative %: 68 % (ref 43–77)
PLATELETS: 270 10*3/uL (ref 150–400)
RBC: 3.47 MIL/uL — ABNORMAL LOW (ref 4.22–5.81)
RDW: 14.5 % (ref 11.5–15.5)
WBC: 10.8 10*3/uL — ABNORMAL HIGH (ref 4.0–10.5)

## 2015-06-03 NOTE — Patient Instructions (Signed)
Meds as reviewed with Brother

## 2015-06-03 NOTE — Progress Notes (Signed)
Subjective:    Patient ID: Gregory Hatfield, male    DOB: January 12, 1926, 81 y.o.   MRN: [id number]  HPI Very nice 81 yo [name] with multiple co-morbidities including HTN, HLD, Insulin requiring T2_DM, mild/mod senile Dementia, Vit D Deficiency who was recently hospitalized Nov 15-19 with Delirium due to dehydration, sepsis attributed to UTI & Bronchitis and then went to [facility] for rehab Nov 19-Dec 3 and now home since with various family and sitters closely monitoring him. Brought in today by a "baby" brother. Patient reports CBG's ranging betw 60-150's with a "median" of 138.     Patient c/o diarrhea today and med list reveals he is on Miralax, Colace & Senokot & he's advised to only use on an as needed basis. His brother indicates that his daughters are seeking AL placement and realizing that he cannot live alone.  Medication Sig  . aspirin 81 MG tablet Take 81 mg by mouth daily.  . carvedilol  12.5 MG tablet TAKE  (1)  TABLET TWICE A DAY FOR HIGH BLOOD PRESSURE.  Marland Kitchen VITAMIN D 5000 UNITS  Take 5,000 Units by mouth daily.   Marland Kitchen diltiazem  CD 240 MG  TAKE (1) CAPSULE DAILY FOR HIGH BLOOD PRESSURE.  Marland Kitchen COLACE 100 MG capsule Take 1 capsule (100 mg total) by mouth 2 (two) times daily.  . furosemide 40 MG tablet Take 1 tablet (40 mg total) by mouth daily.  . APRESOLINE 25 MG tablet Take 1 tablet (25 mg total) by mouth 3 (three) times daily.  Marland Kitchen LEVSIN/SL 0.125 MG SL  Place 1 tablet (0.125 mg total) under the tongue every 4 (four) hours as needed.  Marland Kitchen LEVEMIR FLEXTOUCH)100 UNIT/ML Inject 30 Units into the skin every morning.  Marland Kitchen losartan  50 MG tablet Take 1 tablet (50 mg total) by mouth daily.   Marland Kitchen LUMIGAN 0.01 % SOLN Place 1 drop into the right eye at bedtime.   . Multiple Vitamin Take 1 tablet by mouth daily.    Marland Kitchen MYRBETRIQ 50 MG TB24 Take 50 mg by mouth daily.   Marland Kitchen MIRALAX / GLYCOLAX Take 17 g by mouth 2 (two) times daily.  . pravastatin  40 MG tablet TAKE 1 TABLET ONCE DAILY.  Marland Kitchen PROBIOTIC DAILY Take 1  tablet by mouth daily.   [name]  Take 1 tablet by mouth at bedtime  . SIMBRINZA 1-0.2 % SUSP Place 1 drop into both eyes 3 (three) times daily.  Marland Kitchen MYLICON 80 MG  Chew 2 tablets every 6 hours as needed.   Allergies  Allergen Reactions  . Ace Inhibitors Other (See Comments) and Cough    Chronic cough  . Atenolol     Atenolol eye drops  . Demerol Other (See Comments)    Hard to wake up  . Flomax [Tamsulosin Hcl] Other (See Comments)    Low BP  . Morphine And Related Other (See Comments)    hallucinations  . Oxytrol [Oxybutynin] Rash   Review of Systems  10 point systems review negative except as above.    Objective:   Physical Exam  BP 136/60 mmHg  Pulse 76  Temp(Src) 97.5 F (36.4 C)  Resp 16  Ht 5\' 11"  (1.803 m)  Wt 220 lb 3.2 oz (99.882 kg)  BMI 30.73 kg/m2  HEENT - Eac's patent. TM's Nl. EOM's full. PERRLA. NasoOroPharynx clear. Neck - supple. Nl Thyroid. Carotids 2+ & No bruits, nodes, JVD Chest - Clear equal BS w/o Rales, rhonchi, wheezes. Cor - Nl HS.  RRR w/o sig MGR. PP 1(+). No edema. Abd - No palpable organomegaly, masses or tenderness. BS nl. MS- FROM w/o deformities. Muscle power, tone and bulk Nl. Gait Nl. Neuro - No obvious Cr N abnormalities. Sensory, motor and Cerebellar functions appear Nl w/o focal abnormalities. Psyche - Mental status - alert.  Patient has limited insight into his cognitive deficiencies.    Assessment & Plan:   1. Benign renovascular hypertension   2. Controlled type 1 diabetes mellitus with stage 3 chronic kidney disease ([name])   3. Medication management  - BASIC METABOLIC PANEL WITH GFR - CBC with Differential/Platelet  4. Urinary tract infection, site not specified  - Urinalysis, Routine w reflex microscopic (not at [facility]) - Urine culture  Over 34minutes of exam, counseling, chart review and  critical decision making was performed.

## 2015-06-04 LAB — BASIC METABOLIC PANEL WITH GFR
BUN: 26 mg/dL — AB (ref 7–25)
CALCIUM: 8.5 mg/dL — AB (ref 8.6–10.3)
CHLORIDE: 109 mmol/L (ref 98–110)
CO2: 23 mmol/L (ref 20–31)
CREATININE: 2 mg/dL — AB (ref 0.70–1.11)
GFR, Est African American: 34 mL/min — ABNORMAL LOW (ref >=60)
GFR, Est Non African American: 29 mL/min — ABNORMAL LOW (ref >=60)
Glucose, Bld: 70 mg/dL (ref 65–99)
Potassium: 3.7 mmol/L (ref 3.5–5.3)
Sodium: 141 mmol/L (ref 135–146)

## 2015-06-04 LAB — URINALYSIS, ROUTINE W REFLEX MICROSCOPIC
BILIRUBIN URINE: NEGATIVE
GLUCOSE, UA: NEGATIVE
Hgb urine dipstick: NEGATIVE
KETONES UR: NEGATIVE
Nitrite: POSITIVE — AB
SPECIFIC GRAVITY, URINE: 1.019 (ref 1.001–1.035)
pH: 5.5 (ref 5.0–8.0)

## 2015-06-04 LAB — URINALYSIS, MICROSCOPIC ONLY
Casts: NONE SEEN [LPF]
Crystals: NONE SEEN [HPF]
SQUAMOUS EPITHELIAL / LPF: NONE SEEN [HPF] (ref <=5)
YEAST: NONE SEEN [HPF]

## 2015-06-05 ENCOUNTER — Other Ambulatory Visit: Payer: Self-pay | Admitting: Internal Medicine

## 2015-06-05 LAB — URINE CULTURE

## 2015-06-05 MED ORDER — CIPROFLOXACIN HCL 500 MG PO TABS: ORAL_TABLET | ORAL | Status: AC

## 2015-06-11 ENCOUNTER — Ambulatory Visit: Payer: Self-pay | Admitting: Internal Medicine

## 2015-06-11 ENCOUNTER — Other Ambulatory Visit: Payer: Self-pay | Admitting: Internal Medicine

## 2015-06-11 DIAGNOSIS — R609 Edema, unspecified: Secondary | ICD-10-CM

## 2015-06-11 DIAGNOSIS — I10 Essential (primary) hypertension: Secondary | ICD-10-CM

## 2015-06-11 MED ORDER — FUROSEMIDE 40 MG PO TABS: ORAL_TABLET | ORAL | Status: DC

## 2015-06-19 ENCOUNTER — Other Ambulatory Visit: Payer: Self-pay | Admitting: Internal Medicine

## 2015-06-23 ENCOUNTER — Other Ambulatory Visit: Payer: Self-pay | Admitting: Endocrinology

## 2015-06-23 ENCOUNTER — Other Ambulatory Visit: Payer: Self-pay | Admitting: Internal Medicine

## 2015-06-23 ENCOUNTER — Other Ambulatory Visit: Payer: Self-pay | Admitting: *Deleted

## 2015-06-23 MED ORDER — HYDRALAZINE HCL 25 MG PO TABS: ORAL_TABLET | ORAL | Status: DC

## 2015-06-24 ENCOUNTER — Telehealth: Payer: Self-pay | Admitting: Internal Medicine

## 2015-06-24 DIAGNOSIS — E1022 Type 1 diabetes mellitus with diabetic chronic kidney disease: Secondary | ICD-10-CM | POA: Diagnosis not present

## 2015-06-24 DIAGNOSIS — F0280 Dementia in other diseases classified elsewhere without behavioral disturbance: Secondary | ICD-10-CM | POA: Diagnosis not present

## 2015-06-24 DIAGNOSIS — R2681 Unsteadiness on feet: Secondary | ICD-10-CM | POA: Diagnosis not present

## 2015-06-24 DIAGNOSIS — I509 Heart failure, unspecified: Secondary | ICD-10-CM | POA: Diagnosis not present

## 2015-06-24 DIAGNOSIS — G309 Alzheimer's disease, unspecified: Secondary | ICD-10-CM | POA: Diagnosis not present

## 2015-06-24 DIAGNOSIS — I130 Hypertensive heart and chronic kidney disease with heart failure and stage 1 through stage 4 chronic kidney disease, or unspecified chronic kidney disease: Secondary | ICD-10-CM | POA: Diagnosis not present

## 2015-06-24 NOTE — Telephone Encounter (Signed)
Wes @ [facility] [phone] Requesting PT extention 2times a week for 4weeks and [place] 2times aweek for 4weeks. Dr Melford Aase approved verbally

## 2015-06-25 ENCOUNTER — Other Ambulatory Visit: Payer: Self-pay | Admitting: *Deleted

## 2015-06-25 MED ORDER — BENZONATATE 100 MG PO CAPS: 100.0000 mg | ORAL_CAPSULE | Freq: Three times a day (TID) | ORAL | Status: DC | PRN

## 2015-06-29 ENCOUNTER — Encounter: Payer: Self-pay | Admitting: Sports Medicine

## 2015-06-29 ENCOUNTER — Other Ambulatory Visit: Payer: Self-pay | Admitting: Internal Medicine

## 2015-06-29 ENCOUNTER — Ambulatory Visit (INDEPENDENT_AMBULATORY_CARE_PROVIDER_SITE_OTHER): Payer: Medicare Other | Admitting: Sports Medicine

## 2015-06-29 DIAGNOSIS — M79672 Pain in left foot: Secondary | ICD-10-CM | POA: Diagnosis not present

## 2015-06-29 DIAGNOSIS — E119 Type 2 diabetes mellitus without complications: Secondary | ICD-10-CM

## 2015-06-29 DIAGNOSIS — M79671 Pain in right foot: Secondary | ICD-10-CM | POA: Diagnosis not present

## 2015-06-29 DIAGNOSIS — B351 Tinea unguium: Secondary | ICD-10-CM

## 2015-06-29 NOTE — Progress Notes (Signed)
Patient ID: Gregory Hatfield, male   DOB: Aug 16, 1925, 82 y.o.   MRN: [id number] Subjective: Gregory Hatfield is a 82 y.o. male patient with history of type 2 diabetes who presents to office today complaining of long, painful nails  while ambulating in shoes; unable to trim. Patient states that the glucose reading this morning was 200 mg/dl.Patient denies any new changes in medication or new problems. Patient denies any new cramping, numbness, burning or tingling in the legs.  Patient Active Problem List   Diagnosis Date Noted  . CKD stage 4 due to type 2 diabetes mellitus ([place]) 06/03/2015  . Vitamin D deficiency 06/03/2015  . Medication management 06/03/2015  . Controlled type 1 diabetes mellitus with stage 3 chronic kidney disease ([place]) 06/03/2015  . Constipation 05/09/2015  . IDDM (insulin dependent diabetes mellitus) ([place])   . Uncontrolled insulin-dependent diabetes mellitus with renal manifestation ([place]) 05/05/2015  . Dyslipidemia associated with type 2 diabetes mellitus ([place]) 05/05/2015  . Urinary tract infection, site not specified 05/05/2015  . Essential hypertension 05/05/2015  . SDAT (senile dementia of Alzheimer's type) 06/16/2013   Current Outpatient Prescriptions on File Prior to Visit  Medication Sig Dispense Refill  . aspirin 81 MG tablet Take 81 mg by mouth daily.    . B-D UF III MINI PEN NEEDLES 31G X 5 MM MISC USE FOR INJECTIONS AS DIRECTED 100 each 1  . benzonatate (TESSALON PERLES) 100 MG capsule Take 1 capsule (100 mg total) by mouth 3 (three) times daily as needed for cough. 90 capsule 0  . Cholecalciferol (VITAMIN D3) 5000 UNITS CAPS Take 5,000 Units by mouth daily.     Marland Kitchen diltiazem (CARDIZEM CD) 240 MG 24 hr capsule TAKE (1) CAPSULE DAILY FOR HIGH BLOOD PRESSURE. 90 capsule 0  . docusate sodium (COLACE) 100 MG capsule Take 1 capsule (100 mg total) by mouth 2 (two) times daily. 10 capsule 0  . furosemide (LASIX) 40 MG tablet Take 1 to 2 tablets daily as directed for BP  and ankle swelling 180 tablet 1  . hydrALAZINE (APRESOLINE) 25 MG tablet TAKE ONE TABLET 3 TIMES A DAY FOR BLOOD PRESSURE. 90 tablet 1  . hyoscyamine (LEVSIN/SL) 0.125 MG SL tablet Place 1 tablet (0.125 mg total) under the tongue every 4 (four) hours as needed. 30 tablet 0  . LEVEMIR FLEXTOUCH 100 UNIT/ML Pen INJECT 0.5ML (50 UNITS) INTO THE SKIN DAILY 15 mL 0  . losartan (COZAAR) 50 MG tablet Take 1 tablet (50 mg total) by mouth daily. Resume on 05/12/2015 30 tablet 0  . LUMIGAN 0.01 % SOLN Place 1 drop into the right eye at bedtime.     . Multiple Vitamin (MULTIVITAMIN) tablet Take 1 tablet by mouth daily.      Marland Kitchen MYRBETRIQ 50 MG TB24 tablet Take 50 mg by mouth daily.     . polyethylene glycol (MIRALAX / GLYCOLAX) packet Take 17 g by mouth 2 (two) times daily. 14 each 0  . pravastatin (PRAVACHOL) 40 MG tablet TAKE 1 TABLET ONCE DAILY. 90 tablet 2  . Probiotic Product (PROBIOTIC DAILY PO) Take 1 tablet by mouth daily.     Marland Kitchen senna-docusate (SENOKOT-S) 8.6-50 MG tablet Take 1 tablet by mouth at bedtime. (Patient taking differently: Take 1 tablet by mouth daily. )    . SIMBRINZA 1-0.2 % SUSP Place 1 drop into both eyes 3 (three) times daily.    . simethicone (MYLICON) 80 MG chewable tablet Chew 2 tablets (160 mg total) by mouth 4 (four)  times daily. Use for 4 days, then every 6 hours as needed. 30 tablet 0  . TRUE METRIX BLOOD GLUCOSE TEST test strip CHECK BLOOD SUGAR 6 TIMES A DAY. 200 each 2   No current facility-administered medications on file prior to visit.   Allergies  Allergen Reactions  . Ace Inhibitors Other (See Comments) and Cough    Chronic cough  . Atenolol     Atenolol eye drops  . Demerol Other (See Comments)    Hard to wake up  . Flomax [Tamsulosin Hcl] Other (See Comments)    Low BP  . Morphine And Related Other (See Comments)    hallucinations  . Oxytrol [Oxybutynin] Rash    Labs: HEMOGLOBIN A1C- Per patient report "good".  Objective: General: Patient is awake,  alert, and oriented x 3 and in no acute distress.  Integument: Skin is warm, dry and supple bilateral. Nails are tender, long, thickened and  dystrophic with subungual debris, consistent with onychomycosis, 1-5 bilateral. No signs of infection. No open lesions or preulcerative lesions present bilateral. Remaining integument unremarkable.  Vasculature:  Dorsalis Pedis pulse 1/4 bilateral. Posterior Tibial pulse  0/4 bilateral.  Capillary fill time <3 sec 1-5 bilateral. Positive hair growth to the level of the digits. Temperature gradient within normal limits. No varicosities present bilateral. Trace edema present bilateral.   Neurology: The patient has intact sensation measured with a 5.07/10g Semmes Weinstein Monofilament at all pedal sites bilateral . Vibratory sensation diminished bilateral with tuning fork. No Babinski sign present bilateral.   Musculoskeletal: No gross pedal deformities noted bilateral. Muscular strength 5/5 in all lower extremity muscular groups bilateral without pain or limitation on range of motion . No tenderness with calf compression bilateral.  Assessment and Plan: Problem List Items Addressed This Visit    None    Visit Diagnoses    Dermatophytosis of nail    -  Primary    Foot pain, bilateral        Diabetes mellitus without complication ([place])          -Examined patient. -Discussed and educated patient on diabetic foot care, especially with  regards to the vascular, neurological and musculoskeletal systems.  -Stressed the importance of good glycemic control and the detriment of not  controlling glucose levels in relation to the foot. -Mechanically debrided all nails 1-5 bilateral using sterile nail nipper and filed with dremel without incident  -Answered all patient questions -Patient to return in 3 months for at risk foot care -Patient advised to call the office if any problems or questions arise in the meantime.  Gregory Hatfield, DPM

## 2015-07-02 ENCOUNTER — Ambulatory Visit (INDEPENDENT_AMBULATORY_CARE_PROVIDER_SITE_OTHER): Payer: Medicare PPO | Admitting: Endocrinology

## 2015-07-02 ENCOUNTER — Encounter: Payer: Self-pay | Admitting: Endocrinology

## 2015-07-02 VITALS — BP 128/62 | HR 59 | Temp 97.9°F | Ht 71.0 in | Wt 209.0 lb

## 2015-07-02 DIAGNOSIS — IMO0001 Reserved for inherently not codable concepts without codable children: Secondary | ICD-10-CM

## 2015-07-02 DIAGNOSIS — E1165 Type 2 diabetes mellitus with hyperglycemia: Secondary | ICD-10-CM

## 2015-07-02 DIAGNOSIS — E1129 Type 2 diabetes mellitus with other diabetic kidney complication: Secondary | ICD-10-CM

## 2015-07-02 DIAGNOSIS — Z794 Long term (current) use of insulin: Principal | ICD-10-CM

## 2015-07-02 LAB — POCT GLYCOSYLATED HEMOGLOBIN (HGB A1C): Hemoglobin A1C: 7.4

## 2015-07-02 MED ORDER — INSULIN DETEMIR 100 UNIT/ML FLEXPEN: 45.0000 [IU] | PEN_INJECTOR | SUBCUTANEOUS | Status: DC

## 2015-07-02 NOTE — Progress Notes (Signed)
Subjective:    Patient ID: Gregory Hatfield, male    DOB: 03-04-1926, 82 y.o.   MRN: [id number]  HPI Pt returns for f/u of diabetes mellitus: DM type: Insulin-requiring type 2 Dx'ed: [id number] Complications: renal insuff and CHF. Therapy: insulin since 2003 DKA: never Severe hypoglycemia: never.   Pancreatitis: never. Other: he is on a QD insulin regimen, due to noncompliance with multiple daily injections.   Interval history: he brings a record of his cbg's which i have reviewed today.  It varies from 74-200's. There is no trend throughout the day.  I asked pt's brother, who says pt states pt is doing well in general.  Pt says he misses the insulin approx twice per week, out of fear of hypoglycemia.   Past Medical History  Diagnosis Date  . Essential hypertension 05/05/2015  . Uncontrolled insulin-dependent diabetes mellitus with renal manifestation ([place]) 05/05/2015  . Dyslipidemia associated with type 2 diabetes mellitus ([place]) 05/05/2015  . IDDM (insulin dependent diabetes mellitus) ([place])   . SDAT (senile dementia of Alzheimer's type) 06/16/2013  . CKD stage 4 due to type 2 diabetes mellitus ([place]) 06/03/2015    Past Surgical History  Procedure Laterality Date  . Cardiac catheterization  04/2004    normal r-sided pressure, mild pulm HTN  . Prostate surgery  10/2010    transurethral resection   . Transthoracic echocardiogram  05/2006    EF 60-70%; mild calcif of MV; mild MV regurg; LA mildly dilated  . Carpal tunnel release Right   . Lumbar spine surgery  2003  . Cervical spine surgery  1990's  . Total hip arthroplasty Left 01/17/2014    Procedure: LEFT TOTAL HIP ARTHROPLASTY;  Surgeon: Yvette Rack., MD;  Location: [facility];  Service: Orthopedics;  Laterality: Left;  . Transurethral resection of prostate N/A 08/22/2014    Procedure: TRANSURETHRAL RESECTION OF THE PROSTATE (TURP);  Surgeon: Marcia Brash, MD;  Location: [facility];  Service: Urology;  Laterality: N/A;    Social  History   Social History  . Marital Status: Widowed    Spouse Name: N/A  . Number of Children: 5  . Years of Education: master's   Occupational History  . teacher/instructor    Social History Main Topics  . Smoking status: Former Smoker    Quit date: 06/21/1983  . Smokeless tobacco: Never Used  . Alcohol Use: No  . Drug Use: No  . Sexual Activity: No   Other Topics Concern  . Not on file   Social History Narrative    Current Outpatient Prescriptions on File Prior to Visit  Medication Sig Dispense Refill  . aspirin 81 MG tablet Take 81 mg by mouth daily.    . B-D UF III MINI PEN NEEDLES 31G X 5 MM MISC USE FOR INJECTIONS AS DIRECTED 100 each 1  . benzonatate (TESSALON PERLES) 100 MG capsule Take 1 capsule (100 mg total) by mouth 3 (three) times daily as needed for cough. 90 capsule 0  . carvedilol (COREG) 12.5 MG tablet TAKE  (1)  TABLET TWICE A DAY FOR HIGH BLOOD PRESSURE. 60 tablet 2  . Cholecalciferol (VITAMIN D3) 5000 UNITS CAPS Take 5,000 Units by mouth daily.     Marland Kitchen diltiazem (CARDIZEM CD) 240 MG 24 hr capsule TAKE (1) CAPSULE DAILY FOR HIGH BLOOD PRESSURE. 90 capsule 0  . docusate sodium (COLACE) 100 MG capsule Take 1 capsule (100 mg total) by mouth 2 (two) times daily. 10 capsule 0  . furosemide (LASIX)  40 MG tablet Take 1 to 2 tablets daily as directed for BP and ankle swelling 180 tablet 1  . hydrALAZINE (APRESOLINE) 25 MG tablet TAKE ONE TABLET 3 TIMES A DAY FOR BLOOD PRESSURE. 90 tablet 1  . hyoscyamine (LEVSIN/SL) 0.125 MG SL tablet Place 1 tablet (0.125 mg total) under the tongue every 4 (four) hours as needed. 30 tablet 0  . losartan (COZAAR) 50 MG tablet Take 1 tablet (50 mg total) by mouth daily. Resume on 05/12/2015 30 tablet 0  . LUMIGAN 0.01 % SOLN Place 1 drop into the right eye at bedtime.     . Multiple Vitamin (MULTIVITAMIN) tablet Take 1 tablet by mouth daily.      Marland Kitchen MYRBETRIQ 50 MG TB24 tablet Take 50 mg by mouth daily.     . polyethylene glycol  (MIRALAX / GLYCOLAX) packet Take 17 g by mouth 2 (two) times daily. 14 each 0  . pravastatin (PRAVACHOL) 40 MG tablet TAKE 1 TABLET ONCE DAILY. 90 tablet 2  . Probiotic Product (PROBIOTIC DAILY PO) Take 1 tablet by mouth daily.     Marland Kitchen senna-docusate (SENOKOT-S) 8.6-50 MG tablet Take 1 tablet by mouth at bedtime. (Patient taking differently: Take 1 tablet by mouth daily. )    . SIMBRINZA 1-0.2 % SUSP Place 1 drop into both eyes 3 (three) times daily.    . TRUE METRIX BLOOD GLUCOSE TEST test strip CHECK BLOOD SUGAR 6 TIMES A DAY. 200 each 2  . simethicone (MYLICON) 80 MG chewable tablet Chew 2 tablets (160 mg total) by mouth 4 (four) times daily. Use for 4 days, then every 6 hours as needed. 30 tablet 0   No current facility-administered medications on file prior to visit.    Allergies  Allergen Reactions  . Ace Inhibitors Other (See Comments) and Cough    Chronic cough  . Atenolol     Atenolol eye drops  . Demerol Other (See Comments)    Hard to wake up  . Flomax [Tamsulosin Hcl] Other (See Comments)    Low BP  . Morphine And Related Other (See Comments)    hallucinations  . Oxytrol [Oxybutynin] Rash    Family History  Problem Relation Age of Onset  . Diabetes Mother   . Hypertension Mother   . Prostate cancer Father   . Heart disease Father   . Diabetes Brother   . Cancer Brother   . Heart disease Brother   . Diabetes Sister   . Heart disease Sister   . Hyperlipidemia Child   . Hypertension Child   . Diabetes Child   . Heart attack Child   . Cancer Child     BP 128/62 mmHg  Pulse 59  Temp(Src) 97.9 F (36.6 C) (Oral)  Ht 5\' 11"  (1.803 m)  Wt 209 lb (94.802 kg)  BMI 29.16 kg/m2  SpO2 95%  Review of Systems He denies hypoglycemia.      Objective:   Physical Exam VITAL SIGNS:  See vs page.   GENERAL: no distress.   Pulses: dorsalis pedis intact bilat.  CV: 2+ bilat leg edema.  Feet: no deformity. There is spotty hyperpigmentation on the legs and feet. There  is bilateral onychomycosis.  Skin: feet are of normal color and temp. no ulcer on the feet.  Neuro: sensation is intact to touch on the feet.   A1c=7.4%     Assessment & Plan:  DM: this is the best control this pt should aim for, given this regimen, which  does match insulin to his changing needs throughout the day.  Patient is advised the following: Patient Instructions  check your blood sugar 6 times a day--before the 3 meals, and at bedtime.  also check if you have symptoms of your blood sugar being too high or too low.  please keep a record of the readings and bring it to your next appointment here.  please call us sooner if you are having low blood sugar episodes.   On this type of insulin schedule, you should eat meals on a regular schedule.  If a meal is missed or significantly delayed, your blood sugar could go low. To avoid the lows, carry some packets of cheese and crackers.  This way, you can avoid the lows.   Please reduce the levemir to 45 units each morning.   Please come back for a follow-up appointment in 4 months.

## 2015-07-02 NOTE — Patient Instructions (Addendum)
check your blood sugar 6 times a day--before the 3 meals, and at bedtime.  also check if you have symptoms of your blood sugar being too high or too low.  please keep a record of the readings and bring it to your next appointment here.  please call us sooner if you are having low blood sugar episodes.   On this type of insulin schedule, you should eat meals on a regular schedule.  If a meal is missed or significantly delayed, your blood sugar could go low. To avoid the lows, carry some packets of cheese and crackers.  This way, you can avoid the lows.   Please reduce the levemir to 45 units each morning.   Please come back for a follow-up appointment in 4 months.

## 2015-07-06 ENCOUNTER — Ambulatory Visit (INDEPENDENT_AMBULATORY_CARE_PROVIDER_SITE_OTHER): Payer: Medicare Other | Admitting: Internal Medicine

## 2015-07-06 ENCOUNTER — Encounter: Payer: Self-pay | Admitting: Internal Medicine

## 2015-07-06 VITALS — BP 180/82 | HR 72 | Temp 97.3°F | Resp 16 | Ht 71.0 in | Wt 210.4 lb

## 2015-07-06 DIAGNOSIS — N390 Urinary tract infection, site not specified: Secondary | ICD-10-CM | POA: Diagnosis not present

## 2015-07-06 DIAGNOSIS — E1022 Type 1 diabetes mellitus with diabetic chronic kidney disease: Secondary | ICD-10-CM

## 2015-07-06 DIAGNOSIS — G308 Other Alzheimer's disease: Secondary | ICD-10-CM | POA: Diagnosis not present

## 2015-07-06 DIAGNOSIS — H4010X0 Unspecified open-angle glaucoma, stage unspecified: Secondary | ICD-10-CM | POA: Insufficient documentation

## 2015-07-06 DIAGNOSIS — Z79899 Other long term (current) drug therapy: Secondary | ICD-10-CM | POA: Diagnosis not present

## 2015-07-06 DIAGNOSIS — I10 Essential (primary) hypertension: Secondary | ICD-10-CM

## 2015-07-06 DIAGNOSIS — F0280 Dementia in other diseases classified elsewhere without behavioral disturbance: Secondary | ICD-10-CM

## 2015-07-06 DIAGNOSIS — E1169 Type 2 diabetes mellitus with other specified complication: Secondary | ICD-10-CM | POA: Diagnosis not present

## 2015-07-06 DIAGNOSIS — N183 Chronic kidney disease, stage 3 (moderate): Secondary | ICD-10-CM | POA: Diagnosis not present

## 2015-07-06 DIAGNOSIS — E785 Hyperlipidemia, unspecified: Secondary | ICD-10-CM | POA: Diagnosis not present

## 2015-07-06 DIAGNOSIS — G301 Alzheimer's disease with late onset: Secondary | ICD-10-CM

## 2015-07-06 DIAGNOSIS — E559 Vitamin D deficiency, unspecified: Secondary | ICD-10-CM

## 2015-07-06 LAB — CBC WITH DIFFERENTIAL/PLATELET
BASOS PCT: 1 % (ref 0–1)
Basophils Absolute: 0.1 10*3/uL (ref 0.0–0.1)
EOS ABS: 0.2 10*3/uL (ref 0.0–0.7)
Eosinophils Relative: 3 % (ref 0–5)
HCT: 34.3 % — ABNORMAL LOW (ref 39.0–52.0)
HEMOGLOBIN: 11.2 g/dL — AB (ref 13.0–17.0)
Lymphocytes Relative: 37 % (ref 12–46)
Lymphs Abs: 2.3 10*3/uL (ref 0.7–4.0)
MCH: 29.4 pg (ref 26.0–34.0)
MCHC: 32.7 g/dL (ref 30.0–36.0)
MCV: 90 fL (ref 78.0–100.0)
MONO ABS: 0.5 10*3/uL (ref 0.1–1.0)
MONOS PCT: 9 % (ref 3–12)
MPV: 10.9 fL (ref 8.6–12.4)
Neutro Abs: 3.1 10*3/uL (ref 1.7–7.7)
Neutrophils Relative %: 50 % (ref 43–77)
PLATELETS: 225 10*3/uL (ref 150–400)
RBC: 3.81 MIL/uL — ABNORMAL LOW (ref 4.22–5.81)
RDW: 14.9 % (ref 11.5–15.5)
WBC: 6.1 10*3/uL (ref 4.0–10.5)

## 2015-07-06 NOTE — Patient Instructions (Signed)

## 2015-07-06 NOTE — Progress Notes (Signed)
Patient ID: Gregory Hatfield, male   DOB: 24-Jun-1925, 82 y.o.   MRN: [id number]     This very nice 82 y.o. [facility] presents for follow up with Hypertension, Hyperlipidemia, SDAT, Insulin requiring T2_DM , CKD 3 and Vitamin D Deficiency. Patient was recently hospitalized Nov 15-19 with Delirium due to dehydration, sepsis attributed to UTI & Bronchitis and then went to [facility] for rehab Nov 19-Dec 3. Patient has been home since Dec 3rd with various family and sitters closely monitoring him. Brought in today by 2 daughters who are searching for an AL facility for him. Apparently they realize that he can no longer live independently for med compliance or meals.      Patient is treated for HTN since 1992 & BP has been controlled at home. Today's BP: is 180/82 - re-checked at 150/80. Patient has had no complaints of any cardiac type chest pain, palpitations, dyspnea/orthopnea/PND, dizziness, claudication, or dependent edema.     Hyperlipidemia is controlled with diet & meds. Patient denies myalgias or other med SE's. Last Lipids were Cholesterol 127; HDL 46; LDL 66; Triglycerides 75 on 03/30/2015.      Also, the patient has history of Insulin requiring T2_NIDDM w/CKD 3 since 1975 , initially on oral agents til started on insulin in 1997 by Dr Loanne Drilling.  Pt has had no symptoms of reactive hypoglycemia, diabetic polys, paresthesias or visual blurring.  Last A1c was 7.4% on 07/02/2015 at Dr Cordelia Pen office.       Further, the patient also has history of Vitamin D Deficiency of "28" in 2008 and supplements vitamin D without any suspected side-effects. Last vitamin D was 54 on 12/18/2014.     Medication Sig  . aspirin 81 MG tablet Take 81 mg by mouth daily.  . carvedilol 12.5 MG tablet TAKE  (1)  TABLET TWICE A DAY FOR HIGH BLOOD PRESSURE.  Marland Kitchen VITAMIN D 5000 UNITS  Take 5,000 Units by mouth daily.   Marland Kitchen diltiazem CD 240 MG TAKE (1) CAPSULE DAILY FOR HIGH BLOOD PRESSURE.  . furosemide 40 MG tablet Take 1 to 2 tablets  daily as directed for BP and ankle swelling  . hydrALAZINE  25 MG tablet TAKE ONE TABLET 3 TIMES A DAY FOR BLOOD PRESSURE.  Marland Kitchen LEVEMIR FLEXTOUCH  Inject 45 Units into the skin every morning. And pen needles 1/day  . losartan  50 MG tablet Take 1 tablet (50 mg total) by mouth daily. Resume on 05/12/2015  . LUMIGAN 0.01 % SOLN Place 1 drop into the right eye at bedtime.   . Multiple Vitamin Take 1 tablet by mouth daily.    Marland Kitchen MYRBETRIQ 50 MG TB24 Take 50 mg by mouth daily.   . pravastatin  40 MG tablet TAKE 1 TABLET ONCE DAILY.  . Probiotic Product  Take 1 tablet by mouth daily.   Marland Kitchen SIMBRINZA 1-0.2 % SUSP Place 1 drop into both eyes 3 (three) times daily.  . simethicone (MYLICON) 80 MG Chew 2 tablets (160 mg total) by mouth 4 (four) times daily. Use for 4 days, then every 6 hours as needed.   Allergies  Allergen Reactions  . Ace Inhibitors Other (See Comments) and Cough    Chronic cough  . Atenolol     Atenolol eye drops  . Demerol Other (See Comments)    Hard to wake up  . Flomax [Tamsulosin Hcl] Other (See Comments)    Low BP  . Morphine And Related Other (See Comments)    hallucinations  .  Oxytrol [Oxybutynin] Rash   PMHx:   Past Medical History  Diagnosis Date  . Essential hypertension 05/05/2015  . Uncontrolled insulin-dependent diabetes mellitus with renal manifestation ([place]) 05/05/2015  . Dyslipidemia associated with type 2 diabetes mellitus ([place]) 05/05/2015  . IDDM (insulin dependent diabetes mellitus) ([place])   . SDAT (senile dementia of Alzheimer's type) 06/16/2013  . CKD stage 4 due to type 2 diabetes mellitus ([place]) 06/03/2015   Immunization History  Administered Date(s) Administered  . DTaP 09/28/2006  . Influenza Whole 04/05/2010, 04/04/2013  . Influenza, High Dose Seasonal PF 04/02/2014, 03/30/2015  . Influenza-Unspecified 02/19/2011  . Pneumococcal Conjugate-13 06/10/2014  . Pneumococcal Polysaccharide-23 07/21/2001, 03/20/2008  . Td 08/28/2008   Past Surgical  History  Procedure Laterality Date  . Cardiac catheterization  04/2004    normal r-sided pressure, mild pulm HTN  . Prostate surgery  10/2010    transurethral resection   . Transthoracic echocardiogram  05/2006    EF 60-70%; mild calcif of MV; mild MV regurg; LA mildly dilated  . Carpal tunnel release Right   . Lumbar spine surgery  2003  . Cervical spine surgery  1990's  . Total hip arthroplasty Left 01/17/2014    Procedure: LEFT TOTAL HIP ARTHROPLASTY;  Surgeon: Yvette Rack., MD;  Location: [facility];  Service: Orthopedics;  Laterality: Left;  . Transurethral resection of prostate N/A 08/22/2014    Procedure: TRANSURETHRAL RESECTION OF THE PROSTATE (TURP);  Surgeon: Marcia Brash, MD;  Location: [facility];  Service: Urology;  Laterality: N/A;   FHx:    Reviewed / unchanged  SHx:    Reviewed / unchanged  Systems Review:  Constitutional: Denies fever, chills, wt changes, headaches, insomnia, fatigue, night sweats, change in appetite. Eyes: Denies redness, blurred vision, diplopia, discharge, itchy, watery eyes.  ENT: Denies discharge, congestion, post nasal drip, epistaxis, sore throat, earache, hearing loss, dental pain, tinnitus, vertigo, sinus pain, snoring.  CV: Denies chest pain, palpitations, irregular heartbeat, syncope, dyspnea, diaphoresis, orthopnea, PND, claudication or edema. Respiratory: denies cough, dyspnea, DOE, pleurisy, hoarseness, laryngitis, wheezing.  Gastrointestinal: Denies dysphagia, odynophagia, heartburn, reflux, water brash, abdominal pain or cramps, nausea, vomiting, bloating, diarrhea, constipation, hematemesis, melena, hematochezia  or hemorrhoids. Genitourinary: Denies frequency, urgency, nocturia, hesitancy, discharge, hematuria or flank pain. Has had recent dysuria tx'd w/Cipro.  Musculoskeletal: Denies arthralgias, myalgias, stiffness, jt. swelling, pain, limping or strain/sprain.  Skin: Denies pruritus, rash, hives, warts, acne, eczema or change in skin  lesion(s). Neuro: No weakness, tremor, incoordination, spasms, paresthesia or pain. Psychiatric: Denies confusion, memory loss or sensory loss. Endo: Denies change in weight, skin or hair change.  Heme/Lymph: No excessive bleeding, bruising or enlarged lymph nodes.  Physical Exam  BP 180/82 mmHg  Pulse 72  Temp(Src) 97.3 F (36.3 C)  Resp 16  Ht 5\' 11"  (1.803 m)  Wt 210 lb 6.4 oz (95.437 kg)  BMI 29.36 kg/m2  Appears well nourished and in no distress. Eyes: PERRLA, EOMs, conjunctiva no swelling or erythema. Sinuses: No frontal/maxillary tenderness ENT/Mouth: EAC's clear, TM's nl w/o erythema, bulging. Nares clear w/o erythema, swelling, exudates. Oropharynx clear without erythema or exudates. Oral hygiene is good. Tongue normal, non obstructing. Hearing intact.  Neck: Supple. Thyroid nl. Car 2+/2+ without bruits, nodes or JVD. Chest: Respirations nl with BS clear & equal w/o rales, rhonchi, wheezing or stridor.  Cor: Heart sounds normal w/ regular rate and rhythm without sig. murmurs, gallops, clicks, or rubs. Peripheral pulses normal and equal  without edema.  Abdomen: Soft & bowel  sounds normal. Non-tender w/o guarding, rebound, hernias, masses, or organomegaly.  Lymphatics: Unremarkable.  Musculoskeletal: Full ROM all peripheral extremities, joint stability, 5/5 strength, and normal gait.  Skin: Warm, dry without exposed rashes, lesions or ecchymosis apparent.  Neuro: Cranial nerves intact, reflexes equal bilaterally. Sensory-motor testing grossly intact. Tendon reflexes grossly intact.  Pysch: Alert & oriented x 3.  Insight and judgement limited. ST recall poor.  Assessment and Plan:  1. Essential hypertension  - TSH  2. Dyslipidemia associated with type 2 diabetes mellitus (HCC)  - Lipid panel - TSH  3. Controlled type 1 diabetes mellitus with stage 3 chronic kidney disease ([place])   4. Vitamin D deficiency  - VITAMIN D 25 Hydroxy (Vit-D Deficiency, Fractures)  5.  Medication management  - CBC with Differential/Platelet - BASIC METABOLIC PANEL WITH GFR - Hepatic function panel - Magnesium  6. SDAT (senile dementia of Alzheimer's type)   7. Open-angle glaucoma, unspecified glaucoma stage, unspecified laterality, unspecified open-angle glaucoma type   8. UTI , recent   - re-ck U/A & C/S  Recommended regular exercise, BP monitoring, weight control, and discussed med and SE's. Recommended labs to assess and monitor clinical status. Further disposition pending results of labs. Over 30 minutes of exam, counseling, chart review was performed. ROV 6 weeks to monitor closely.

## 2015-07-06 NOTE — Addendum Note (Signed)
Addended by: [name] on: 07/06/2015 09:50 PM   Modules accepted: Level of Service

## 2015-07-07 LAB — URINALYSIS, ROUTINE W REFLEX MICROSCOPIC
Bilirubin Urine: NEGATIVE
GLUCOSE, UA: NEGATIVE
HGB URINE DIPSTICK: NEGATIVE
KETONES UR: NEGATIVE
NITRITE: NEGATIVE
PH: 6.5 (ref 5.0–8.0)
Protein, ur: NEGATIVE
SPECIFIC GRAVITY, URINE: 1.006 (ref 1.001–1.035)

## 2015-07-07 LAB — BASIC METABOLIC PANEL WITH GFR
BUN: 29 mg/dL — ABNORMAL HIGH (ref 7–25)
CALCIUM: 9.4 mg/dL (ref 8.6–10.3)
CO2: 27 mmol/L (ref 20–31)
CREATININE: 1.72 mg/dL — AB (ref 0.70–1.11)
Chloride: 103 mmol/L (ref 98–110)
GFR, Est African American: 40 mL/min — ABNORMAL LOW (ref >=60)
GFR, Est Non African American: 35 mL/min — ABNORMAL LOW (ref >=60)
Glucose, Bld: 89 mg/dL (ref 65–99)
Potassium: 4.1 mmol/L (ref 3.5–5.3)
SODIUM: 136 mmol/L (ref 135–146)

## 2015-07-07 LAB — VITAMIN D 25 HYDROXY (VIT D DEFICIENCY, FRACTURES): Vit D, 25-Hydroxy: 97 ng/mL (ref 30–100)

## 2015-07-07 LAB — HEPATIC FUNCTION PANEL
ALT: 14 U/L (ref 9–46)
AST: 20 U/L (ref 10–35)
Albumin: 3.9 g/dL (ref 3.6–5.1)
Alkaline Phosphatase: 49 U/L (ref 40–115)
BILIRUBIN DIRECT: 0.1 mg/dL (ref <=0.2)
Indirect Bilirubin: 0.3 mg/dL (ref 0.2–1.2)
Total Bilirubin: 0.4 mg/dL (ref 0.2–1.2)
Total Protein: 7.1 g/dL (ref 6.1–8.1)

## 2015-07-07 LAB — URINALYSIS, MICROSCOPIC ONLY
Bacteria, UA: NONE SEEN [HPF]
CASTS: NONE SEEN [LPF]
CRYSTALS: NONE SEEN [HPF]
RBC / HPF: NONE SEEN RBC/HPF (ref <=2)
SQUAMOUS EPITHELIAL / LPF: NONE SEEN [HPF] (ref <=5)
YEAST: NONE SEEN [HPF]

## 2015-07-07 LAB — LIPID PANEL
CHOL/HDL RATIO: 3 ratio (ref <=5.0)
Cholesterol: 148 mg/dL (ref 125–200)
HDL: 50 mg/dL (ref >=40)
LDL CALC: 85 mg/dL (ref <130)
TRIGLYCERIDES: 63 mg/dL (ref <150)
VLDL: 13 mg/dL (ref <30)

## 2015-07-07 LAB — MAGNESIUM: Magnesium: 2 mg/dL (ref 1.5–2.5)

## 2015-07-07 LAB — TSH: TSH: 0.94 u[IU]/mL (ref 0.350–4.500)

## 2015-07-08 LAB — URINE CULTURE
COLONY COUNT: NO GROWTH
Organism ID, Bacteria: NO GROWTH

## 2015-07-16 ENCOUNTER — Other Ambulatory Visit: Payer: Self-pay | Admitting: Endocrinology

## 2015-07-22 ENCOUNTER — Telehealth: Payer: Self-pay | Admitting: *Deleted

## 2015-07-22 NOTE — Telephone Encounter (Signed)
Patient was assured there was no change in his medications based on his last lab results.

## 2015-07-27 ENCOUNTER — Other Ambulatory Visit: Payer: Self-pay | Admitting: Internal Medicine

## 2015-08-17 ENCOUNTER — Encounter: Payer: Self-pay | Admitting: Internal Medicine

## 2015-08-17 ENCOUNTER — Ambulatory Visit (INDEPENDENT_AMBULATORY_CARE_PROVIDER_SITE_OTHER): Payer: Medicare Other | Admitting: Internal Medicine

## 2015-08-17 VITALS — BP 126/58 | HR 62 | Temp 98.2°F | Resp 18 | Ht 71.0 in | Wt 205.0 lb

## 2015-08-17 DIAGNOSIS — I10 Essential (primary) hypertension: Secondary | ICD-10-CM

## 2015-08-17 NOTE — Progress Notes (Signed)
Patient ID: Gregory Hatfield, male   DOB: October 16, 1925, 82 y.o.   MRN: [id number]  Assessment and Plan:   1. Essential hypertension -chart reviewed here in the office and uncontrolled HTN at visit in January -rechecked today and normal.  -BP log from home was also normal -no change in medications need  HPI 82 y.o.male presents for 6 month follow up of Hypertension.  He reports that it was very high when he was here at his last visit he reports that he hadn't taken his BP meds at last visit. Patient reports that they have been doing well.  male is taking their medication.  They are not having difficulty with their medications.  They report no adverse reactions.  He has had no medication changes.  He reports that he checks BP daily and  Past Medical History  Diagnosis Date  . Essential hypertension 05/05/2015  . Uncontrolled insulin-dependent diabetes mellitus with renal manifestation ([place]) 05/05/2015  . Dyslipidemia associated with type 2 diabetes mellitus ([place]) 05/05/2015  . IDDM (insulin dependent diabetes mellitus) ([place])   . SDAT (senile dementia of Alzheimer's type) 06/16/2013  . CKD stage 4 due to type 2 diabetes mellitus ([place]) 06/03/2015     Allergies  Allergen Reactions  . Ace Inhibitors Other (See Comments) and Cough    Chronic cough  . Atenolol     Atenolol eye drops  . Demerol Other (See Comments)    Hard to wake up  . Flomax [Tamsulosin Hcl] Other (See Comments)    Low BP  . Morphine And Related Other (See Comments)    hallucinations  . Oxytrol [Oxybutynin] Rash      Current Outpatient Prescriptions on File Prior to Visit  Medication Sig Dispense Refill  . aspirin 81 MG tablet Take 81 mg by mouth daily.    . B-D UF III MINI PEN NEEDLES 31G X 5 MM MISC USE FOR INJECTIONS AS DIRECTED 100 each 1  . Blood Glucose Monitoring Suppl (ONE TOUCH ULTRA 2) w/Device KIT     . carvedilol (COREG) 12.5 MG tablet TAKE  (1)  TABLET TWICE A DAY FOR HIGH BLOOD PRESSURE. 60 tablet 2   . Cholecalciferol (VITAMIN D3) 5000 UNITS CAPS Take 5,000 Units by mouth daily.     Marland Kitchen diltiazem (CARDIZEM CD) 240 MG 24 hr capsule TAKE (1) CAPSULE DAILY FOR HIGH BLOOD PRESSURE. 90 capsule 0  . docusate sodium (COLACE) 100 MG capsule Take 1 capsule (100 mg total) by mouth 2 (two) times daily. 10 capsule 0  . furosemide (LASIX) 40 MG tablet Take 1 to 2 tablets daily as directed for BP and ankle swelling 180 tablet 1  . hydrALAZINE (APRESOLINE) 25 MG tablet TAKE ONE TABLET 3 TIMES A DAY FOR BLOOD PRESSURE. 90 tablet 1  . hyoscyamine (LEVSIN/SL) 0.125 MG SL tablet Place 1 tablet (0.125 mg total) under the tongue every 4 (four) hours as needed. 30 tablet 0  . Insulin Detemir (LEVEMIR FLEXTOUCH) 100 UNIT/ML Pen Inject 45 Units into the skin every morning. And pen needles 1/day 15 mL 11  . loratadine (CLARITIN) 10 MG tablet Take 10 mg by mouth daily.    Marland Kitchen losartan (COZAAR) 50 MG tablet Take 1 tablet (50 mg total) by mouth daily. Resume on 05/12/2015 30 tablet 0  . LUMIGAN 0.01 % SOLN Place 1 drop into the right eye at bedtime.     . Multiple Vitamin (MULTIVITAMIN) tablet Take 1 tablet by mouth daily.      Marland Kitchen MYRBETRIQ 50  MG TB24 tablet Take 50 mg by mouth daily.     . ONE TOUCH ULTRA TEST test strip CHECK BLOOD SUGAR 6 TIMES A DAY. 150 each 2  . OVER THE COUNTER MEDICATION Mucinex DM 12 hour 2 tablets daily    . polyethylene glycol (MIRALAX / GLYCOLAX) packet Take 17 g by mouth 2 (two) times daily. 14 each 0  . pravastatin (PRAVACHOL) 40 MG tablet TAKE 1 TABLET ONCE DAILY. 90 tablet 2  . Probiotic Product (PROBIOTIC DAILY PO) Take 1 tablet by mouth daily.     Marland Kitchen senna-docusate (SENOKOT-S) 8.6-50 MG tablet Take 1 tablet by mouth at bedtime. (Patient taking differently: Take 1 tablet by mouth daily. )    . SIMBRINZA 1-0.2 % SUSP Place 1 drop into both eyes 3 (three) times daily.    . simethicone (MYLICON) 80 MG chewable tablet Chew 2 tablets (160 mg total) by mouth 4 (four) times daily. Use for 4 days,  then every 6 hours as needed. 30 tablet 0   No current facility-administered medications on file prior to visit.    ROS: all negative except above.   Physical Exam: Filed Weights   08/17/15 1051  Weight: 205 lb (92.987 kg)   Ht '5\' 11"'$  (1.803 m)  Wt 205 lb (92.987 kg)  BMI 28.60 kg/m2 General Appearance: Well developed well nourished, non-toxic appearing in no apparent distress. Eyes: PERRLA, EOMs, conjunctiva w/ no swelling or erythema or discharge Sinuses: No Frontal/maxillary tenderness ENT/Mouth: Ear canals clear without swelling or erythema.  TM's normal bilaterally with no retractions, bulging, or loss of landmarks.   Neck: Supple, thyroid normal, no notable JVD  Respiratory: Respiratory effort normal, Clear breath sounds anteriorly and posteriorly bilaterally without rales, rhonchi, wheezing or stridor. No retractions or accessory muscle usage. Cardio: RRR with no MRGs.   Abdomen: Soft, + BS.  Non tender, no guarding, rebound, hernias, masses.  Musculoskeletal: Full ROM, 5/5 strength, normal gait.  Skin: Warm, dry without rashes  Neuro: Awake and oriented X 3, Cranial nerves intact. Normal muscle tone, no cerebellar symptoms. Sensation intact.  Psych: normal affect, Insight and Judgment appropriate.     Starlyn Skeans, PA-C 10:55 AM [facility]

## 2015-08-25 ENCOUNTER — Other Ambulatory Visit: Payer: Self-pay | Admitting: Internal Medicine

## 2015-09-18 ENCOUNTER — Other Ambulatory Visit: Payer: Self-pay | Admitting: Internal Medicine

## 2015-09-28 ENCOUNTER — Ambulatory Visit (INDEPENDENT_AMBULATORY_CARE_PROVIDER_SITE_OTHER): Payer: Medicare Other | Admitting: Sports Medicine

## 2015-09-28 ENCOUNTER — Encounter: Payer: Self-pay | Admitting: Sports Medicine

## 2015-09-28 DIAGNOSIS — M79676 Pain in unspecified toe(s): Secondary | ICD-10-CM | POA: Diagnosis not present

## 2015-09-28 DIAGNOSIS — E119 Type 2 diabetes mellitus without complications: Secondary | ICD-10-CM | POA: Diagnosis not present

## 2015-09-28 DIAGNOSIS — B351 Tinea unguium: Secondary | ICD-10-CM

## 2015-09-28 NOTE — Progress Notes (Signed)
Patient ID: Gregory Hatfield, male   DOB: [birth year], 82 y.o.   MRN: [id number]  Subjective: Gregory Hatfield is a 82 y.o. male patient with history of type 2 diabetes who presents to office today complaining of long, painful nails  while ambulating in shoes; unable to trim. Patient states that the glucose reading this morning was not recorded. Patient denies any new changes in medication or new problems. Patient denies any new cramping, numbness, burning or tingling in the legs.  Patient Active Problem List   Diagnosis Date Noted  . Open-angle glaucoma 07/06/2015  . CKD stage 4 due to type 2 diabetes mellitus ([place]) 06/03/2015  . Vitamin D deficiency 06/03/2015  . Medication management 06/03/2015  . Controlled type 1 diabetes mellitus with stage 3 chronic kidney disease ([place]) 06/03/2015  . Dyslipidemia associated with type 2 diabetes mellitus ([place]) 05/05/2015  . Urinary tract infection, site not specified 05/05/2015  . Essential hypertension 05/05/2015  . SDAT (senile dementia of Alzheimer's type) 06/16/2013   Current Outpatient Prescriptions on File Prior to Visit  Medication Sig Dispense Refill  . acetaminophen (TYLENOL) 500 MG tablet Take 500 mg by mouth every 6 (six) hours as needed.    Marland Kitchen aspirin 81 MG tablet Take 81 mg by mouth daily.    . B-D UF III MINI PEN NEEDLES 31G X 5 MM MISC USE FOR INJECTIONS AS DIRECTED 100 each 1  . Blood Glucose Monitoring Suppl (ONE TOUCH ULTRA 2) w/Device KIT     . carvedilol (COREG) 12.5 MG tablet TAKE  (1)  TABLET TWICE A DAY FOR HIGH BLOOD PRESSURE. 60 tablet 2  . Cholecalciferol (VITAMIN D3) 5000 UNITS CAPS Take 5,000 Units by mouth daily.     Marland Kitchen diltiazem (CARDIZEM CD) 240 MG 24 hr capsule TAKE (1) CAPSULE DAILY FOR HIGH BLOOD PRESSURE. 90 capsule 1  . docusate sodium (COLACE) 100 MG capsule Take 1 capsule (100 mg total) by mouth 2 (two) times daily. 10 capsule 0  . furosemide (LASIX) 40 MG tablet Take 1 to 2 tablets daily as directed for BP and  ankle swelling 180 tablet 1  . hydrALAZINE (APRESOLINE) 25 MG tablet TAKE ONE TABLET 3 TIMES A DAY FOR BLOOD PRESSURE. 90 tablet 1  . hydrALAZINE (APRESOLINE) 25 MG tablet TAKE ONE TABLET 3 TIMES A DAY FOR BLOOD PRESSURE. 90 tablet 0  . hyoscyamine (LEVSIN/SL) 0.125 MG SL tablet Place 1 tablet (0.125 mg total) under the tongue every 4 (four) hours as needed. 30 tablet 0  . Insulin Detemir (LEVEMIR FLEXTOUCH) 100 UNIT/ML Pen Inject 45 Units into the skin every morning. And pen needles 1/day 15 mL 11  . loratadine (CLARITIN) 10 MG tablet Take 10 mg by mouth daily.    Marland Kitchen losartan (COZAAR) 50 MG tablet Take 1 tablet (50 mg total) by mouth daily. Resume on 05/12/2015 30 tablet 0  . LUMIGAN 0.01 % SOLN Place 1 drop into the right eye at bedtime.     . Multiple Vitamin (MULTIVITAMIN) tablet Take 1 tablet by mouth daily.      Marland Kitchen MYRBETRIQ 50 MG TB24 tablet Take 50 mg by mouth daily.     . ONE TOUCH ULTRA TEST test strip CHECK BLOOD SUGAR 6 TIMES A DAY. 150 each 2  . OVER THE COUNTER MEDICATION Mucinex DM 12 hour 2 tablets daily    . polyethylene glycol (MIRALAX / GLYCOLAX) packet Take 17 g by mouth 2 (two) times daily. 14 each 0  . pravastatin (PRAVACHOL) 40 MG  tablet TAKE 1 TABLET ONCE DAILY. 90 tablet 2  . Probiotic Product (PROBIOTIC DAILY PO) Take 1 tablet by mouth daily.     Marland Kitchen senna-docusate (SENOKOT-S) 8.6-50 MG tablet Take 1 tablet by mouth at bedtime. (Patient taking differently: Take 1 tablet by mouth daily. )    . SIMBRINZA 1-0.2 % SUSP Place 1 drop into both eyes 3 (three) times daily.    . simethicone (MYLICON) 80 MG chewable tablet Chew 2 tablets (160 mg total) by mouth 4 (four) times daily. Use for 4 days, then every 6 hours as needed. 30 tablet 0   No current facility-administered medications on file prior to visit.   Allergies  Allergen Reactions  . Ace Inhibitors Other (See Comments) and Cough    Chronic cough  . Atenolol     Atenolol eye drops  . Demerol Other (See Comments)     Hard to wake up  . Flomax [Tamsulosin Hcl] Other (See Comments)    Low BP  . Morphine And Related Other (See Comments)    hallucinations  . Oxytrol [Oxybutynin] Rash    Labs: HEMOGLOBIN A1C- Per patient report "good".  Objective: General: Patient is awake, alert, and oriented x 3 and in no acute distress.  Integument: Skin is warm, dry and supple bilateral. Nails are tender, long, thickened and  dystrophic with subungual debris, consistent with onychomycosis, 1-5 bilateral. No signs of infection. No open lesions or preulcerative lesions present bilateral. Remaining integument unremarkable.  Vasculature:  Dorsalis Pedis pulse 1/4 bilateral. Posterior Tibial pulse  0/4 bilateral.  Capillary fill time <3 sec 1-5 bilateral. Positive hair growth to the level of the digits. Temperature gradient within normal limits. No varicosities present bilateral. Trace edema present bilateral.   Neurology: The patient has intact sensation measured with a 5.07/10g Semmes Weinstein Monofilament at all pedal sites bilateral . Vibratory sensation diminished bilateral with tuning fork. No Babinski sign present bilateral.   Musculoskeletal: No gross pedal deformities noted bilateral. Muscular strength 5/5 in all lower extremity muscular groups bilateral without pain or limitation on range of motion . No tenderness with calf compression bilateral.  Assessment and Plan: Problem List Items Addressed This Visit    None    Visit Diagnoses    Pain due to onychomycosis of toenail    -  Primary    Diabetes mellitus without complication ([place])          -Examined patient. -Discussed and educated patient on diabetic foot care, especially with  regards to the vascular, neurological and musculoskeletal systems.  -Stressed the importance of good glycemic control and the detriment of not  controlling glucose levels in relation to the foot. -Mechanically debrided all nails 1-5 bilateral using sterile nail nipper and  filed with dremel without incident  -Answered all patient questions -Patient to return in 3 months for at risk foot care -Patient advised to call the office if any problems or questions arise in the meantime.  Landis Martins, DPM

## 2015-09-29 ENCOUNTER — Other Ambulatory Visit: Payer: Self-pay | Admitting: Internal Medicine

## 2015-10-05 ENCOUNTER — Other Ambulatory Visit: Payer: Self-pay | Admitting: Internal Medicine

## 2015-10-05 ENCOUNTER — Other Ambulatory Visit: Payer: Self-pay | Admitting: Endocrinology

## 2015-10-12 ENCOUNTER — Ambulatory Visit (INDEPENDENT_AMBULATORY_CARE_PROVIDER_SITE_OTHER): Payer: Medicare Other | Admitting: Physician Assistant

## 2015-10-12 ENCOUNTER — Encounter: Payer: Self-pay | Admitting: Physician Assistant

## 2015-10-12 ENCOUNTER — Ambulatory Visit: Payer: Self-pay | Admitting: Internal Medicine

## 2015-10-12 VITALS — BP 130/70 | HR 61 | Temp 97.9°F | Resp 14 | Ht 71.0 in | Wt 207.6 lb

## 2015-10-12 DIAGNOSIS — F0280 Dementia in other diseases classified elsewhere without behavioral disturbance: Secondary | ICD-10-CM

## 2015-10-12 DIAGNOSIS — Z0001 Encounter for general adult medical examination with abnormal findings: Secondary | ICD-10-CM

## 2015-10-12 DIAGNOSIS — H4010X0 Unspecified open-angle glaucoma, stage unspecified: Secondary | ICD-10-CM

## 2015-10-12 DIAGNOSIS — E785 Hyperlipidemia, unspecified: Secondary | ICD-10-CM | POA: Diagnosis not present

## 2015-10-12 DIAGNOSIS — E559 Vitamin D deficiency, unspecified: Secondary | ICD-10-CM

## 2015-10-12 DIAGNOSIS — E1122 Type 2 diabetes mellitus with diabetic chronic kidney disease: Secondary | ICD-10-CM

## 2015-10-12 DIAGNOSIS — I10 Essential (primary) hypertension: Secondary | ICD-10-CM | POA: Diagnosis not present

## 2015-10-12 DIAGNOSIS — C61 Malignant neoplasm of prostate: Secondary | ICD-10-CM

## 2015-10-12 DIAGNOSIS — G308 Other Alzheimer's disease: Secondary | ICD-10-CM | POA: Diagnosis not present

## 2015-10-12 DIAGNOSIS — R350 Frequency of micturition: Secondary | ICD-10-CM | POA: Diagnosis not present

## 2015-10-12 DIAGNOSIS — Z79899 Other long term (current) drug therapy: Secondary | ICD-10-CM | POA: Diagnosis not present

## 2015-10-12 DIAGNOSIS — Z7189 Other specified counseling: Secondary | ICD-10-CM | POA: Insufficient documentation

## 2015-10-12 DIAGNOSIS — E113599 Type 2 diabetes mellitus with proliferative diabetic retinopathy without macular edema, unspecified eye: Secondary | ICD-10-CM | POA: Diagnosis not present

## 2015-10-12 DIAGNOSIS — N183 Chronic kidney disease, stage 3 (moderate): Secondary | ICD-10-CM

## 2015-10-12 DIAGNOSIS — E11319 Type 2 diabetes mellitus with unspecified diabetic retinopathy without macular edema: Secondary | ICD-10-CM | POA: Insufficient documentation

## 2015-10-12 DIAGNOSIS — Z0000 Encounter for general adult medical examination without abnormal findings: Secondary | ICD-10-CM

## 2015-10-12 DIAGNOSIS — G301 Alzheimer's disease with late onset: Secondary | ICD-10-CM

## 2015-10-12 DIAGNOSIS — R6889 Other general symptoms and signs: Secondary | ICD-10-CM | POA: Diagnosis not present

## 2015-10-12 DIAGNOSIS — E1169 Type 2 diabetes mellitus with other specified complication: Secondary | ICD-10-CM

## 2015-10-12 LAB — URINALYSIS, ROUTINE W REFLEX MICROSCOPIC
BILIRUBIN URINE: NEGATIVE
GLUCOSE, UA: NEGATIVE
HGB URINE DIPSTICK: NEGATIVE
KETONES UR: NEGATIVE
LEUKOCYTES UA: NEGATIVE
Nitrite: NEGATIVE
PH: 5.5 (ref 5.0–8.0)
Protein, ur: NEGATIVE
SPECIFIC GRAVITY, URINE: 1.013 (ref 1.001–1.035)

## 2015-10-12 NOTE — Progress Notes (Signed)
Patient ID: Gregory Hatfield, male   DOB: 11-Jun-1926, 82 y.o.   MRN: [id number]  MEDICARE ANNUAL WELLNESS VISIT AND OV  Assessment:   1. Essential hypertension - continue medications, DASH diet, exercise and monitor at home. Call if greater than 130/80.   2. CKD stage 3 due to type 2 diabetes mellitus ([place]) Discussed general issues about diabetes pathophysiology and management., Educational material distributed., Suggested low cholesterol diet., Encouraged aerobic exercise., Discussed foot care., Reminded to get yearly retinal exam. - BASIC METABOLIC PANEL WITH GFR  3. Dyslipidemia associated with type 2 diabetes mellitus ([place]) -continue medications, check lipids, decrease fatty foods, increase activity.   4. Vitamin D deficiency  5. Medication management continue follow up  6. Open-angle glaucoma, unspecified glaucoma stage, unspecified laterality, unspecified open-angle glaucoma type  7. SDAT (senile dementia of Alzheimer's type) Will be going to assisted living, continue to monitor, needs aid with medications/food, etc.   8. Prostate cancer ([place]) Continue follow up  9. Proliferative diabetic retinopathy associated with type 2 diabetes mellitus, macular edema presence unspecified ([place]) continue follow up  10. Encounter for Medicare annual wellness exam  11. Urinary frequency - Urinalysis, Routine w reflex microscopic (not at [facility]) - Urine culture   Plan:   During the course of the visit the patient was educated and counseled about appropriate screening and preventive services including:    Pneumococcal vaccine   Influenza vaccine  Td vaccine  Screening electrocardiogram  Bone densitometry screening  Colorectal cancer screening  Diabetes screening  Glaucoma screening  Nutrition counseling   Advanced directives: requested  Conditions/risks identified: BMI: Discussed weight loss, diet, and increase physical activity.  Increase physical activity: AHA  recommends 150 minutes of physical activity a week.  Medications reviewed Diabetes is not at goal, ACE/ARB therapy: Yes. Urinary Incontinence is not an issue: discussed non pharmacology and pharmacology options.  Fall risk: low- discussed PT, home fall assessment, medications.   Subjective:   Gregory Hatfield  presents for [facility] Wellness Visit and OV.  Date of last medicare wellness visit was 08/2014.   He has had elevated blood pressure since 1992. His blood pressure has been controlled at home, BP: 130/70 mmHg He does not workout. He denies chest pain, shortness of breath, dizziness.  He is on cholesterol medication and denies myalgias. His cholesterol is at goal. The cholesterol last visit was:  Lab Results  Component Value Date   CHOL 148 07/06/2015   HDL 50 07/06/2015   LDLCALC 85 07/06/2015   TRIG 63 07/06/2015   CHOLHDL 3.0 07/06/2015   He has had diabetes for 43 years since 1973 - initially treated with oral agents but started on Insulin in 1997. Currently he's followed by Dr Loanne Drilling who has innovated a protocol of loose control given the patient's limited cognitive capacity and insight in comprehending a vigorous regimen, on insulin 45 units .  He has been working on diet and exercise and denies foot ulcerations, hyperglycemia, hypoglycemia , nausea, paresthesia of the feet, polydipsia, polyuria and visual disturbances. Last A1C in the office was:  Lab Results  Component Value Date   HGBA1C 7.4 07/02/2015   Patient is on Vitamin D supplement.   Lab Results  Component Value Date   VD25OH 97 07/06/2015     Patient has GERD which is controlled with diet and IBS for which he occasionally takes hyoscyamine. Other problems include Depression, controlled or in remission and lastly hx/o of low grade Prostate Cancer monitored by Dr Gaynelle Arabian.  Patient has SDAT and is no longer able to live independently due to medical noncompliance and meals, but still at home, walking with a  walking stick for balance, and grand daughter stays with him all the time. Gregory Hatfield brother is here with him.    HAD LABS DRAWN AT [facility] 09/29/2015- last A1C was 7.9, and LDL 76, Anemia was 11.9/36.6. Cr 1.980,  BUN 43, TSH 1.28, B12 1186,   Names of Other Physician/Practitioners you currently use: 1. [facility] here for primary care 2. Dr Lanell Matar, eye doctor, last visit Dec 2017 for quarterly Glaucoma monitoring 3. Dr's Loistine Simas, DDS, dentist, last visit - 1 week ago  Patient Care Team: [name], MD as PCP - General (Internal Medicine) Renato Shin, MD as Consulting Physician (Endocrinology) Diane Dalphine Handing (Optometry) Earlie Server, MD as Consulting Physician (Orthopedic Surgery) Carolan Clines, MD as Consulting Physician (Urology) Laurence Spates, MD as Consulting Physician (Gastroenterology) Fleet Contras, MD as Consulting Physician (Nephrology) Ninetta Lights, MD as Consulting Physician (Orthopedic Surgery) Pixie Casino, MD as Consulting Physician (Cardiology) Teena Irani, MD as Consulting Physician (Gastroenterology)  Medication Review: Current Outpatient Prescriptions on File Prior to Visit  Medication Sig Dispense Refill  . acetaminophen (TYLENOL) 500 MG tablet Take 500 mg by mouth every 6 (six) hours as needed.    Marland Kitchen aspirin 81 MG tablet Take 81 mg by mouth daily.    . B-D UF III MINI PEN NEEDLES 31G X 5 MM MISC USE FOR INJECTIONS AS DIRECTED 100 each 1  . Blood Glucose Monitoring Suppl (ONE TOUCH ULTRA 2) w/Device KIT     . carvedilol (COREG) 12.5 MG tablet TAKE  (1)  TABLET TWICE A DAY FOR HIGH BLOOD PRESSURE. 180 tablet 1  . Cholecalciferol (VITAMIN D3) 5000 UNITS CAPS Take 5,000 Units by mouth daily.     Marland Kitchen diltiazem (CARDIZEM CD) 240 MG 24 hr capsule TAKE (1) CAPSULE DAILY FOR HIGH BLOOD PRESSURE. 90 capsule 1  . furosemide (LASIX) 40 MG tablet Take 1 to 2 tablets daily as directed for BP and  ankle swelling 180 tablet 1  . hydrALAZINE (APRESOLINE) 25 MG tablet TAKE ONE TABLET 3 TIMES A DAY FOR BLOOD PRESSURE. 90 tablet 1  . hyoscyamine (LEVSIN/SL) 0.125 MG SL tablet Place 1 tablet (0.125 mg total) under the tongue every 4 (four) hours as needed. 30 tablet 0  . Insulin Detemir (LEVEMIR FLEXTOUCH) 100 UNIT/ML Pen Inject 45 Units into the skin every morning. And pen needles 1/day 15 mL 11  . loratadine (CLARITIN) 10 MG tablet Take 10 mg by mouth daily.    Marland Kitchen losartan (COZAAR) 50 MG tablet Take 1 tablet (50 mg total) by mouth daily. Resume on 05/12/2015 30 tablet 0  . LUMIGAN 0.01 % SOLN Place 1 drop into the right eye at bedtime.     . Multiple Vitamin (MULTIVITAMIN) tablet Take 1 tablet by mouth daily.      Marland Kitchen MYRBETRIQ 50 MG TB24 tablet Take 50 mg by mouth daily.     . ONE TOUCH ULTRA TEST test strip CHECK BLOOD SUGAR 6 TIMES A DAY. 150 each 2  . OVER THE COUNTER MEDICATION Mucinex DM 12 hour 2 tablets daily    . pravastatin (PRAVACHOL) 40 MG tablet TAKE 1 TABLET ONCE DAILY. 90 tablet 1  . Probiotic Product (PROBIOTIC DAILY PO) Take 1 tablet by mouth daily.     Marland Kitchen senna-docusate (SENOKOT-S) 8.6-50 MG tablet Take 1 tablet by mouth at  bedtime. (Patient taking differently: Take 1 tablet by mouth daily. )    . SIMBRINZA 1-0.2 % SUSP Place 1 drop into both eyes 3 (three) times daily.     No current facility-administered medications on file prior to visit.    Current Problems (verified) Patient Active Problem List   Diagnosis Date Noted  . Prostate cancer ([place]) 10/12/2015  . Diabetic retinopathy ([place]) 10/12/2015  . Encounter for Medicare annual wellness exam 10/12/2015  . Open-angle glaucoma 07/06/2015  . Vitamin D deficiency 06/03/2015  . Medication management 06/03/2015  . CKD stage 3 due to type 2 diabetes mellitus ([place]) 06/03/2015  . Dyslipidemia associated with type 2 diabetes mellitus ([place]) 05/05/2015  . Urinary tract infection, site not specified 05/05/2015  . Essential  hypertension 05/05/2015  . SDAT (senile dementia of Alzheimer's type) 06/16/2013   Screening Tests Immunization History  Administered Date(s) Administered  . DTaP 09/28/2006  . Influenza Whole 04/05/2010, 04/04/2013  . Influenza, High Dose Seasonal PF 04/02/2014, 03/30/2015  . Influenza-Unspecified 02/19/2011  . Pneumococcal Conjugate-13 06/10/2014  . Pneumococcal Polysaccharide-23 07/21/2001, 03/20/2008  . Td 08/28/2008   Preventative care: Last colonoscopy: 2010- recc 10 yr f/u  Echo 04/2015 55-60% CT head 04/2015 Renal US 2007  Influenza 03/2015 Prenvar 13 2015 Pneumonia 2009 Tetantus 2010 Zoster- declines  History reviewed: allergies, current medications, past family history, past medical history, past social history, past surgical history and problem list  Risk Factors: Tobacco Social History  Substance Use Topics  . Smoking status: Former Smoker    Quit date: 06/21/1983  . Smokeless tobacco: Never Used  . Alcohol Use: No   MEDICARE WELLNESS OBJECTIVES: Physical activity: Current Exercise Habits: The patient does not participate in regular exercise at present, Exercise limited by: orthopedic condition(s) Cardiac risk factors: Cardiac Risk Factors include: advanced age (>34mn, >>65women);diabetes mellitus;dyslipidemia;hypertension;sedentary lifestyle;obesity (BMI >30kg/m2);male gender Depression/mood screen:   [facility] 10/12/2015  Decreased Interest 0  Down, Depressed, Hopeless 0  PHQ - 2 Score 0    ADLs:  In your present state of health, do you have any difficulty performing the following activities: 10/12/2015 07/06/2015  Hearing? Y N  Vision? Y -  Difficulty concentrating or making decisions? Y -  Walking or climbing stairs? Y -  Dressing or bathing? N -  Doing errands, shopping? Y -  PConservation officer, natureand eating ? Y -  Using the Toilet? Y -  In the past six months, have you accidently leaked urine? Y -  Do you have problems with loss of  bowel control? N -  Managing your Medications? Y -  Managing your Finances? Y -  Housekeeping or managing your Housekeeping? Y -     Cognitive Testing  Alert? Yes  Normal Appearance?Yes  Oriented to person? Yes  Place? Yes   Time? Yes  Recall of three objects?  No  Can perform simple calculations? No  Displays appropriate judgment?Yes  Can read the correct time from a watch face? No  EOL planning: Does patient have an advance directive?: Yes Type of Advance Directive: Healthcare Power of Attorney, Living will Does patient want to make changes to advanced directive?: No - Patient declined Copy of advanced directive(s) in chart?: No - copy requested    Allergies  Allergen Reactions  . Ace Inhibitors Other (See Comments) and Cough    Chronic cough  . Atenolol     Atenolol eye drops  . Demerol Other (See Comments)    Hard to wake up  .  Flomax [Tamsulosin Hcl] Other (See Comments)    Low BP  . Morphine And Related Other (See Comments)    hallucinations  . Oxytrol [Oxybutynin] Rash     Past Medical History  Diagnosis Date  . Essential hypertension 05/05/2015  . Uncontrolled insulin-dependent diabetes mellitus with renal manifestation ([place]) 05/05/2015  . Dyslipidemia associated with type 2 diabetes mellitus ([place]) 05/05/2015  . IDDM (insulin dependent diabetes mellitus) ([place])   . SDAT (senile dementia of Alzheimer's type) 06/16/2013  . CKD stage 4 due to type 2 diabetes mellitus ([place]) 06/03/2015    Past Surgical History  Procedure Laterality Date  . Cardiac catheterization  04/2004    normal r-sided pressure, mild pulm HTN  . Prostate surgery  10/2010    transurethral resection   . Transthoracic echocardiogram  05/2006    EF 60-70%; mild calcif of MV; mild MV regurg; LA mildly dilated  . Carpal tunnel release Right   . Lumbar spine surgery  2003  . Cervical spine surgery  1990's  . Total hip arthroplasty Left 01/17/2014    Procedure: LEFT TOTAL HIP ARTHROPLASTY;   Surgeon: Yvette Rack., MD;  Location: [facility];  Service: Orthopedics;  Laterality: Left;  . Transurethral resection of prostate N/A 08/22/2014    Procedure: TRANSURETHRAL RESECTION OF THE PROSTATE (TURP);  Surgeon: Marcia Brash, MD;  Location: [facility];  Service: Urology;  Laterality: N/A;     Objective:     BP 130/70 mmHg  Pulse 61  Temp(Src) 97.9 F (36.6 C) (Temporal)  Resp 14  Ht _0  (1.803 m)  Wt 207 lb 9.6 oz (94.167 kg)  BMI 28.97 kg/m2  SpO2 96%  General Appearance:  Alert  WD/WN, male , in no apparent distress. Eyes: PERRLA, EOMs, conjunctiva no swelling or erythema, normal fundi and vessels. Sinuses: No frontal/maxillary tenderness ENT/Mouth: EACs patent / TMs  nl. Nares clear without erythema, swelling, mucoid exudates. Oral hygiene is good. No erythema, swelling, or exudate. Tongue normal, non-obstructing. Tonsils not swollen or erythematous. Hearing decreased Neck: Supple, thyroid normal. No bruits, nodes or JVD. Respiratory: Respiratory effort normal.  BS equal and clear bilateral without rales, rhonci, wheezing or stridor. Cardio: Heart sounds are normal with regular rate and rhythm and no murmurs, rubs or gallops. Peripheral pulses are normal and equal bilaterally without edema. No aortic or femoral bruits. Chest: symmetric with normal excursions and percussion.  Abdomen: Flat, soft, with nl bowel sounds. Nontender, no guarding, rebound, hernias, masses, or organomegaly.  Lymphatics: Non tender without lymphadenopathy.  Musculoskeletal: Full ROM all peripheral extremities, joint stability, 4/5 strength, and antaglic gait with cane.  Skin: Warm and dry without rashes, lesions, cyanosis, clubbing or  ecchymosis.  Neuro: Cranial nerves intact, reflexes equal bilaterally. Normal muscle tone, no cerebellar symptoms. Sensation intact.  Pysch: Awake and oriented X 3 with normal affect, insight and judgment appropriate.    Medicare Attestation I have personally  reviewed: The patient's medical and social history Their use of alcohol, tobacco or illicit drugs Their current medications and supplements The patient's functional ability including ADLs,fall risks, home safety risks, cognitive, and hearing and visual impairment Diet and physical activities Evidence for depression or mood disorders  The patient's weight, height, BMI, and visual acuity have been recorded in the chart.  I have made referrals, counseling, and provided education to the patient based on review of the above and I have provided the patient with a written personalized care plan for preventive services.  Over 40 minutes of  exam, counseling, chart review was performed.   Vicie Mutters, PA-C   10/12/2015

## 2015-10-12 NOTE — Patient Instructions (Signed)
Your A1C is a measure of your sugar over the past 3 months and is not affected by what you have eaten over the past few days. Diabetes increases your chances of stroke and heart attack over 300 % and is the leading cause of blindness and kidney failure in the [place]. Please make sure you decrease bad carbs like white bread, white rice, potatoes, corn, soft drinks, pasta, cereals, refined sugars, sweet tea, dried fruits, and fruit juice. Good carbs are okay to eat in moderation like sweet potatoes, brown rice, whole grain pasta/bread, most fruit (except dried fruit) and you can eat as many veggies as you want.   Greater than 6.5 is considered diabetic. Between 6.4 and 5.7 is prediabetic If your A1C is less than 5.7 you are NOT diabetic.  Targets for Glucose Readings: Time of Check Target for patients WITHOUT Diabetes Target for DIABETICS  Before Meals Less than 100  less than 150  Two hours after meals Less than 200  Less than 250       Bad carbs also include fruit juice, alcohol, and sweet tea. These are empty calories that do not signal to your brain that you are full.   Please remember the good carbs are still carbs which convert into sugar. So please measure them out no more than 1/2-1 cup of rice, oatmeal, pasta, and beans  Veggies are however free foods! Pile them on.   Not all fruit is created equal. Please see the list below, the fruit at the bottom is higher in sugars than the fruit at the top. Please avoid all dried fruits.     

## 2015-10-13 LAB — BASIC METABOLIC PANEL WITH GFR
BUN: 38 mg/dL — AB (ref 7–25)
CALCIUM: 9.2 mg/dL (ref 8.6–10.3)
CO2: 26 mmol/L (ref 20–31)
CREATININE: 1.77 mg/dL — AB (ref 0.70–1.11)
Chloride: 105 mmol/L (ref 98–110)
GFR, EST AFRICAN AMERICAN: 39 mL/min — AB (ref >=60)
GFR, Est Non African American: 34 mL/min — ABNORMAL LOW (ref >=60)
GLUCOSE: 105 mg/dL — AB (ref 65–99)
Potassium: 4.1 mmol/L (ref 3.5–5.3)
Sodium: 141 mmol/L (ref 135–146)

## 2015-10-14 LAB — URINE CULTURE
Colony Count: NO GROWTH
Organism ID, Bacteria: NO GROWTH

## 2015-10-21 ENCOUNTER — Other Ambulatory Visit: Payer: Self-pay | Admitting: Internal Medicine

## 2015-10-30 ENCOUNTER — Ambulatory Visit (INDEPENDENT_AMBULATORY_CARE_PROVIDER_SITE_OTHER): Payer: Medicare PPO | Admitting: Endocrinology

## 2015-10-30 VITALS — BP 116/80 | HR 68 | Ht 71.0 in | Wt 206.0 lb

## 2015-10-30 DIAGNOSIS — E1122 Type 2 diabetes mellitus with diabetic chronic kidney disease: Secondary | ICD-10-CM

## 2015-10-30 DIAGNOSIS — Z794 Long term (current) use of insulin: Secondary | ICD-10-CM | POA: Diagnosis not present

## 2015-10-30 DIAGNOSIS — N183 Chronic kidney disease, stage 3 unspecified: Secondary | ICD-10-CM

## 2015-10-30 LAB — POCT GLYCOSYLATED HEMOGLOBIN (HGB A1C): HEMOGLOBIN A1C: 8

## 2015-10-30 NOTE — Progress Notes (Signed)
Subjective:    Patient ID: Gregory Hatfield, male    DOB: May 25, 1926, 82 y.o.   MRN: [id number]  HPI Pt returns for f/u of diabetes mellitus: DM type: Insulin-requiring type 2 Dx'ed: 6553 Complications: renal insuff, retinopathy, and CHF. Therapy: insulin since 2003 DKA: never Severe hypoglycemia: never.   Pancreatitis: never. Other: he is on a QD insulin regimen, due to noncompliance with multiple daily injections.   Interval history: he brings a record of his cbg's which i have reviewed today.  It varies from 63-300's. There is no trend throughout the day.  pt states he feels well in general. Pt says he never misses the insulin.   Past Medical History  Diagnosis Date  . Essential hypertension 05/05/2015  . Uncontrolled insulin-dependent diabetes mellitus with renal manifestation ([place]) 05/05/2015  . Dyslipidemia associated with type 2 diabetes mellitus ([place]) 05/05/2015  . IDDM (insulin dependent diabetes mellitus) ([place])   . SDAT (senile dementia of Alzheimer's type) 06/16/2013  . CKD stage 4 due to type 2 diabetes mellitus ([place]) 06/03/2015    Past Surgical History  Procedure Laterality Date  . Cardiac catheterization  04/2004    normal r-sided pressure, mild pulm HTN  . Prostate surgery  10/2010    transurethral resection   . Transthoracic echocardiogram  05/2006    EF 60-70%; mild calcif of MV; mild MV regurg; LA mildly dilated  . Carpal tunnel release Right   . Lumbar spine surgery  2003  . Cervical spine surgery  1990's  . Total hip arthroplasty Left 01/17/2014    Procedure: LEFT TOTAL HIP ARTHROPLASTY;  Surgeon: Yvette Rack., MD;  Location: [facility];  Service: Orthopedics;  Laterality: Left;  . Transurethral resection of prostate N/A 08/22/2014    Procedure: TRANSURETHRAL RESECTION OF THE PROSTATE (TURP);  Surgeon: Marcia Brash, MD;  Location: [facility];  Service: Urology;  Laterality: N/A;    Social History   Social History  . Marital Status: Widowed    Spouse  Name: N/A  . Number of Children: 5  . Years of Education: master's   Occupational History  . teacher/instructor    Social History Main Topics  . Smoking status: Former Smoker    Quit date: 06/21/1983  . Smokeless tobacco: Never Used  . Alcohol Use: No  . Drug Use: No  . Sexual Activity: No   Other Topics Concern  . Not on file   Social History Narrative    Current Outpatient Prescriptions on File Prior to Visit  Medication Sig Dispense Refill  . acetaminophen (TYLENOL) 500 MG tablet Take 500 mg by mouth every 6 (six) hours as needed.    Marland Kitchen aspirin 81 MG tablet Take 81 mg by mouth daily.    . B-D UF III MINI PEN NEEDLES 31G X 5 MM MISC USE FOR INJECTIONS AS DIRECTED 100 each 1  . Blood Glucose Monitoring Suppl (ONE TOUCH ULTRA 2) w/Device KIT     . carvedilol (COREG) 12.5 MG tablet TAKE  (1)  TABLET TWICE A DAY FOR HIGH BLOOD PRESSURE. 180 tablet 1  . Cholecalciferol (VITAMIN D3) 5000 UNITS CAPS Take 5,000 Units by mouth daily.     Marland Kitchen diltiazem (CARDIZEM CD) 240 MG 24 hr capsule TAKE (1) CAPSULE DAILY FOR HIGH BLOOD PRESSURE. 90 capsule 1  . furosemide (LASIX) 40 MG tablet Take 1 to 2 tablets daily as directed for BP and ankle swelling 180 tablet 1  . hydrALAZINE (APRESOLINE) 25 MG tablet TAKE ONE TABLET 3  TIMES A DAY FOR BLOOD PRESSURE. 90 tablet 1  . hydrALAZINE (APRESOLINE) 25 MG tablet TAKE ONE TABLET 3 TIMES A DAY FOR BLOOD PRESSURE. 90 tablet 1  . hyoscyamine (LEVSIN/SL) 0.125 MG SL tablet Place 1 tablet (0.125 mg total) under the tongue every 4 (four) hours as needed. 30 tablet 0  . Insulin Detemir (LEVEMIR FLEXTOUCH) 100 UNIT/ML Pen Inject 45 Units into the skin every morning. And pen needles 1/day 15 mL 11  . loratadine (CLARITIN) 10 MG tablet Take 10 mg by mouth daily.    Marland Kitchen losartan (COZAAR) 50 MG tablet Take 1 tablet (50 mg total) by mouth daily. Resume on 05/12/2015 30 tablet 0  . LUMIGAN 0.01 % SOLN Place 1 drop into the right eye at bedtime.     . Multiple Vitamin  (MULTIVITAMIN) tablet Take 1 tablet by mouth daily.      Marland Kitchen MYRBETRIQ 50 MG TB24 tablet Take 50 mg by mouth daily.     . ONE TOUCH ULTRA TEST test strip CHECK BLOOD SUGAR 6 TIMES A DAY. 150 each 2  . OVER THE COUNTER MEDICATION Mucinex DM 12 hour 2 tablets daily    . pravastatin (PRAVACHOL) 40 MG tablet TAKE 1 TABLET ONCE DAILY. 90 tablet 1  . Probiotic Product (PROBIOTIC DAILY PO) Take 1 tablet by mouth daily.     Marland Kitchen senna-docusate (SENOKOT-S) 8.6-50 MG tablet Take 1 tablet by mouth at bedtime. (Patient taking differently: Take 1 tablet by mouth daily. )    . SIMBRINZA 1-0.2 % SUSP Place 1 drop into both eyes 3 (three) times daily.     No current facility-administered medications on file prior to visit.    Allergies  Allergen Reactions  . Ace Inhibitors Other (See Comments) and Cough    Chronic cough  . Atenolol     Atenolol eye drops  . Demerol Other (See Comments)    Hard to wake up  . Flomax [Tamsulosin Hcl] Other (See Comments)    Low BP  . Morphine And Related Other (See Comments)    hallucinations  . Oxytrol [Oxybutynin] Rash    Family History  Problem Relation Age of Onset  . Diabetes Mother   . Hypertension Mother   . Prostate cancer Father   . Heart disease Father   . Diabetes Brother   . Cancer Brother   . Heart disease Brother   . Diabetes Sister   . Heart disease Sister   . Hyperlipidemia Child   . Hypertension Child   . Diabetes Child   . Heart attack Child   . Cancer Child    BP 116/80 mmHg  Pulse 68  Ht _0  (1.803 m)  Wt 206 lb (93.441 kg)  BMI 28.74 kg/m2  SpO2 93%  Review of Systems Denies LOC    Objective:   Physical Exam VITAL SIGNS:  See vs page.  GENERAL: no distress.  Pulses: dorsalis pedis intact bilat.  CV: 2+ bilat leg edema.  Feet: no deformity. There is spotty hyperpigmentation on the legs and feet. There is bilateral onychomycosis.  Skin: feet are of normal color and temp, but the skin is dry and scaly. no ulcer on the feet.    Neuro: sensation is intact to touch on the feet.    A1c=8.0%    Assessment & Plan:  DM: a1c is higher than at last ov, but therapy is limited by variable cbg's Hypoglycemia: this is limiting insulin rx.  Frail elderly state: in this setting, we can't  safely increase insulin.    Patient is advised the following: Patient Instructions  check your blood sugar 6 times a day--before the 3 meals, and at bedtime.  also check if you have symptoms of your blood sugar being too high or too low.  please keep a record of the readings and bring it to your next appointment here.  please call us sooner if you are having low blood sugar episodes.   On this type of insulin schedule, you should eat meals on a regular schedule.  If a meal is missed or significantly delayed, your blood sugar could go low. To avoid the lows, carry some packets of cheese and crackers.  This way, you can avoid the lows.   Please continue the same insulin.   Please come back for a follow-up appointment in 4 months.

## 2015-10-30 NOTE — Patient Instructions (Addendum)
check your blood sugar 6 times a day--before the 3 meals, and at bedtime.  also check if you have symptoms of your blood sugar being too high or too low.  please keep a record of the readings and bring it to your next appointment here.  please call us sooner if you are having low blood sugar episodes.   On this type of insulin schedule, you should eat meals on a regular schedule.  If a meal is missed or significantly delayed, your blood sugar could go low. To avoid the lows, carry some packets of cheese and crackers.  This way, you can avoid the lows.   Please continue the same insulin.   Please come back for a follow-up appointment in 4 months.    

## 2015-11-23 ENCOUNTER — Other Ambulatory Visit: Payer: Self-pay | Admitting: Internal Medicine

## 2015-12-21 ENCOUNTER — Other Ambulatory Visit: Payer: Self-pay | Admitting: Endocrinology

## 2015-12-21 ENCOUNTER — Other Ambulatory Visit: Payer: Self-pay | Admitting: Internal Medicine

## 2016-01-04 ENCOUNTER — Encounter: Payer: Self-pay | Admitting: Sports Medicine

## 2016-01-04 ENCOUNTER — Ambulatory Visit (INDEPENDENT_AMBULATORY_CARE_PROVIDER_SITE_OTHER): Payer: Medicare Other | Admitting: Sports Medicine

## 2016-01-04 DIAGNOSIS — E119 Type 2 diabetes mellitus without complications: Secondary | ICD-10-CM | POA: Diagnosis not present

## 2016-01-04 DIAGNOSIS — B351 Tinea unguium: Secondary | ICD-10-CM | POA: Diagnosis not present

## 2016-01-04 DIAGNOSIS — M79676 Pain in unspecified toe(s): Secondary | ICD-10-CM

## 2016-01-04 NOTE — Progress Notes (Signed)
Patient ID: KAMAURY CUTBIRTH, male   DOB: [birth year], 82 y.o.   MRN: [id number]  Subjective: Gregory Hatfield is a 82 y.o. male patient with history of type 2 diabetes who presents to office today complaining of long, painful nails  while ambulating in shoes; unable to trim. Patient states that the glucose reading this morning was not recorded but has been up and down. Patient denies any new changes in medication or new problems. Patient denies any new cramping, numbness, burning or tingling in the legs.  Patient Active Problem List   Diagnosis Date Noted  . Diabetes ([place]) 10/31/2015  . Prostate cancer ([place]) 10/12/2015  . Diabetic retinopathy ([place]) 10/12/2015  . Encounter for Medicare annual wellness exam 10/12/2015  . Open-angle glaucoma 07/06/2015  . Vitamin D deficiency 06/03/2015  . Medication management 06/03/2015  . CKD stage 3 due to type 2 diabetes mellitus ([place]) 06/03/2015  . Dyslipidemia associated with type 2 diabetes mellitus ([place]) 05/05/2015  . Urinary tract infection, site not specified 05/05/2015  . Essential hypertension 05/05/2015  . SDAT (senile dementia of Alzheimer's type) 06/16/2013   Current Outpatient Prescriptions on File Prior to Visit  Medication Sig Dispense Refill  . acetaminophen (TYLENOL) 500 MG tablet Take 500 mg by mouth every 6 (six) hours as needed.    Marland Kitchen aspirin 81 MG tablet Take 81 mg by mouth daily.    . B-D UF III MINI PEN NEEDLES 31G X 5 MM MISC USE FOR INJECTIONS AS DIRECTED 100 each 1  . Blood Glucose Monitoring Suppl (ONE TOUCH ULTRA 2) w/Device KIT     . carvedilol (COREG) 12.5 MG tablet TAKE  (1)  TABLET TWICE A DAY FOR HIGH BLOOD PRESSURE. 180 tablet 1  . Cholecalciferol (VITAMIN D3) 5000 UNITS CAPS Take 5,000 Units by mouth daily.     Marland Kitchen diltiazem (CARDIZEM CD) 240 MG 24 hr capsule TAKE (1) CAPSULE DAILY FOR HIGH BLOOD PRESSURE. 90 capsule 0  . furosemide (LASIX) 40 MG tablet TAKE 1 OR 2 TABLETS ONCE DAILY FOR BLOOD PRESSURE AND ANKLE SWELLING.  60 tablet 0  . hydrALAZINE (APRESOLINE) 25 MG tablet TAKE ONE TABLET 3 TIMES A DAY FOR BLOOD PRESSURE. 90 tablet 1  . hydrALAZINE (APRESOLINE) 25 MG tablet TAKE ONE TABLET 3 TIMES A DAY FOR BLOOD PRESSURE. 90 tablet 1  . hydrALAZINE (APRESOLINE) 25 MG tablet TAKE ONE TABLET 3 TIMES A DAY FOR BLOOD PRESSURE. 90 tablet 0  . hyoscyamine (LEVSIN/SL) 0.125 MG SL tablet Place 1 tablet (0.125 mg total) under the tongue every 4 (four) hours as needed. 30 tablet 0  . Insulin Detemir (LEVEMIR FLEXTOUCH) 100 UNIT/ML Pen Inject 45 Units into the skin every morning. And pen needles 1/day 15 mL 11  . loratadine (CLARITIN) 10 MG tablet Take 10 mg by mouth daily.    Marland Kitchen losartan (COZAAR) 50 MG tablet TAKE 1 TABLET ONCE DAILY. 30 tablet 0  . LUMIGAN 0.01 % SOLN Place 1 drop into the right eye at bedtime.     . Multiple Vitamin (MULTIVITAMIN) tablet Take 1 tablet by mouth daily.      Marland Kitchen MYRBETRIQ 50 MG TB24 tablet Take 50 mg by mouth daily.     . ONE TOUCH ULTRA TEST test strip CHECK BLOOD SUGAR 6 TIMES A DAY. 150 each 0  . OVER THE COUNTER MEDICATION Mucinex DM 12 hour 2 tablets daily    . pravastatin (PRAVACHOL) 40 MG tablet TAKE 1 TABLET ONCE DAILY. 90 tablet 1  . Probiotic Product (  PROBIOTIC DAILY PO) Take 1 tablet by mouth daily.     Marland Kitchen senna-docusate (SENOKOT-S) 8.6-50 MG tablet Take 1 tablet by mouth at bedtime. (Patient taking differently: Take 1 tablet by mouth daily. )    . SIMBRINZA 1-0.2 % SUSP Place 1 drop into both eyes 3 (three) times daily.     No current facility-administered medications on file prior to visit.   Allergies  Allergen Reactions  . Ace Inhibitors Other (See Comments) and Cough    Chronic cough  . Atenolol     Atenolol eye drops  . Demerol Other (See Comments)    Hard to wake up  . Flomax [Tamsulosin Hcl] Other (See Comments)    Low BP  . Morphine And Related Other (See Comments)    hallucinations  . Oxytrol [Oxybutynin] Rash    Objective: General: Patient is awake, alert,  and oriented x 3 and in no acute distress.  Integument: Skin is warm, dry and supple bilateral. Nails are tender, long, thickened and  dystrophic with subungual debris, consistent with onychomycosis, 1-5 bilateral. No signs of infection. No open lesions or preulcerative lesions present bilateral. Remaining integument unremarkable.  Vasculature:  Dorsalis Pedis pulse 1/4 bilateral. Posterior Tibial pulse  0/4 bilateral. No ischemia or gangrene. Capillary fill time <3 sec 1-5 bilateral. Positive hair growth to the level of the digits. Temperature gradient within normal limits. No varicosities present bilateral. Trace edema present bilateral.   Neurology: The patient has intact sensation measured with a 5.07/10g Semmes Weinstein Monofilament at all pedal sites bilateral . Vibratory sensation diminished bilateral with tuning fork. No Babinski sign present bilateral.   Musculoskeletal: No gross pedal deformities noted bilateral. Muscular strength 5/5 in all lower extremity muscular groups bilateral without pain or limitation on range of motion . No tenderness with calf compression bilateral.  Assessment and Plan: Problem List Items Addressed This Visit    None    Visit Diagnoses    Pain due to onychomycosis of toenail    -  Primary    Diabetes mellitus without complication ([place])          -Examined patient. -Discussed and educated patient on diabetic foot care, especially with  regards to the vascular, neurological and musculoskeletal systems.  -Stressed the importance of good glycemic control and the detriment of not  controlling glucose levels in relation to the foot. -Mechanically debrided all nails 1-5 bilateral using sterile nail nipper and filed with dremel without incident  -Answered all patient questions -Patient to return in 3 months for at risk foot care -Patient advised to call the office if any problems or questions arise in the meantime.  Landis Martins, DPM

## 2016-01-12 ENCOUNTER — Ambulatory Visit (INDEPENDENT_AMBULATORY_CARE_PROVIDER_SITE_OTHER): Payer: Medicare Other | Admitting: Internal Medicine

## 2016-01-12 VITALS — BP 126/64 | HR 60 | Temp 97.4°F | Resp 16 | Ht 70.5 in | Wt 208.6 lb

## 2016-01-12 DIAGNOSIS — G301 Alzheimer's disease with late onset: Secondary | ICD-10-CM

## 2016-01-12 DIAGNOSIS — Z136 Encounter for screening for cardiovascular disorders: Secondary | ICD-10-CM | POA: Diagnosis not present

## 2016-01-12 DIAGNOSIS — E1122 Type 2 diabetes mellitus with diabetic chronic kidney disease: Secondary | ICD-10-CM

## 2016-01-12 DIAGNOSIS — E119 Type 2 diabetes mellitus without complications: Secondary | ICD-10-CM | POA: Diagnosis not present

## 2016-01-12 DIAGNOSIS — Z0001 Encounter for general adult medical examination with abnormal findings: Secondary | ICD-10-CM

## 2016-01-12 DIAGNOSIS — I10 Essential (primary) hypertension: Secondary | ICD-10-CM

## 2016-01-12 DIAGNOSIS — E559 Vitamin D deficiency, unspecified: Secondary | ICD-10-CM

## 2016-01-12 DIAGNOSIS — F0280 Dementia in other diseases classified elsewhere without behavioral disturbance: Secondary | ICD-10-CM

## 2016-01-12 DIAGNOSIS — C61 Malignant neoplasm of prostate: Secondary | ICD-10-CM

## 2016-01-12 DIAGNOSIS — N183 Chronic kidney disease, stage 3 unspecified: Secondary | ICD-10-CM

## 2016-01-12 DIAGNOSIS — Z794 Long term (current) use of insulin: Secondary | ICD-10-CM | POA: Diagnosis not present

## 2016-01-12 DIAGNOSIS — Z1212 Encounter for screening for malignant neoplasm of rectum: Secondary | ICD-10-CM

## 2016-01-12 DIAGNOSIS — E08319 Diabetes mellitus due to underlying condition with unspecified diabetic retinopathy without macular edema: Secondary | ICD-10-CM

## 2016-01-12 DIAGNOSIS — Z79899 Other long term (current) drug therapy: Secondary | ICD-10-CM

## 2016-01-12 DIAGNOSIS — E785 Hyperlipidemia, unspecified: Secondary | ICD-10-CM | POA: Insufficient documentation

## 2016-01-12 DIAGNOSIS — R6889 Other general symptoms and signs: Secondary | ICD-10-CM

## 2016-01-12 NOTE — Patient Instructions (Signed)

## 2016-01-13 LAB — HEPATIC FUNCTION PANEL
ALK PHOS: 59 U/L (ref 40–115)
ALT: 17 U/L (ref 9–46)
AST: 20 U/L (ref 10–35)
Albumin: 4 g/dL (ref 3.6–5.1)
BILIRUBIN INDIRECT: 0.3 mg/dL (ref 0.2–1.2)
Bilirubin, Direct: 0.1 mg/dL (ref <=0.2)
TOTAL PROTEIN: 6.7 g/dL (ref 6.1–8.1)
Total Bilirubin: 0.4 mg/dL (ref 0.2–1.2)

## 2016-01-13 LAB — HEMOGLOBIN A1C
HEMOGLOBIN A1C: 7.5 % — AB (ref <5.7)
MEAN PLASMA GLUCOSE: 169 mg/dL

## 2016-01-13 LAB — LIPID PANEL
Cholesterol: 119 mg/dL — ABNORMAL LOW (ref 125–200)
HDL: 38 mg/dL — ABNORMAL LOW (ref >=40)
LDL CALC: 41 mg/dL (ref <130)
Total CHOL/HDL Ratio: 3.1 Ratio (ref <=5.0)
Triglycerides: 198 mg/dL — ABNORMAL HIGH (ref <150)
VLDL: 40 mg/dL — AB (ref <30)

## 2016-01-13 LAB — BASIC METABOLIC PANEL WITH GFR
BUN: 30 mg/dL — ABNORMAL HIGH (ref 7–25)
CO2: 23 mmol/L (ref 20–31)
Calcium: 8.9 mg/dL (ref 8.6–10.3)
Chloride: 105 mmol/L (ref 98–110)
Creat: 1.89 mg/dL — ABNORMAL HIGH (ref 0.70–1.11)
GFR, EST NON AFRICAN AMERICAN: 31 mL/min — AB (ref >=60)
GFR, Est African American: 36 mL/min — ABNORMAL LOW (ref >=60)
GLUCOSE: 134 mg/dL — AB (ref 65–99)
POTASSIUM: 4 mmol/L (ref 3.5–5.3)
Sodium: 139 mmol/L (ref 135–146)

## 2016-01-13 LAB — CBC WITH DIFFERENTIAL/PLATELET
BASOS PCT: 1 %
Basophils Absolute: 60 cells/uL (ref 0–200)
EOS ABS: 180 {cells}/uL (ref 15–500)
Eosinophils Relative: 3 %
HEMATOCRIT: 36.9 % — AB (ref 38.5–50.0)
HEMOGLOBIN: 12 g/dL — AB (ref 13.2–17.1)
LYMPHS ABS: 2700 {cells}/uL (ref 850–3900)
Lymphocytes Relative: 45 %
MCH: 29 pg (ref 27.0–33.0)
MCHC: 32.5 g/dL (ref 32.0–36.0)
MCV: 89.1 fL (ref 80.0–100.0)
MONO ABS: 480 {cells}/uL (ref 200–950)
MPV: 12.3 fL (ref 7.5–12.5)
Monocytes Relative: 8 %
NEUTROS PCT: 43 %
Neutro Abs: 2580 cells/uL (ref 1500–7800)
Platelets: 194 10*3/uL (ref 140–400)
RBC: 4.14 MIL/uL — AB (ref 4.20–5.80)
RDW: 13.8 % (ref 11.0–15.0)
WBC: 6 10*3/uL (ref 3.8–10.8)

## 2016-01-13 LAB — TSH: TSH: 0.96 m[IU]/L (ref 0.40–4.50)

## 2016-01-13 LAB — URINALYSIS, ROUTINE W REFLEX MICROSCOPIC
BILIRUBIN URINE: NEGATIVE
Glucose, UA: NEGATIVE
Hgb urine dipstick: NEGATIVE
Ketones, ur: NEGATIVE
LEUKOCYTES UA: NEGATIVE
NITRITE: NEGATIVE
PROTEIN: NEGATIVE
SPECIFIC GRAVITY, URINE: 1.011 (ref 1.001–1.035)
pH: 6 (ref 5.0–8.0)

## 2016-01-13 LAB — MICROALBUMIN / CREATININE URINE RATIO
Creatinine, Urine: 57 mg/dL (ref 20–370)
MICROALB/CREAT RATIO: 18 ug/mg{creat} (ref <30)
Microalb, Ur: 1 mg/dL

## 2016-01-13 LAB — VITAMIN D 25 HYDROXY (VIT D DEFICIENCY, FRACTURES): Vit D, 25-Hydroxy: 65 ng/mL (ref 30–100)

## 2016-01-13 LAB — MAGNESIUM: Magnesium: 2 mg/dL (ref 1.5–2.5)

## 2016-01-13 LAB — URIC ACID: Uric Acid, Serum: 4.9 mg/dL (ref 4.0–8.0)

## 2016-01-13 LAB — PSA: PSA: 0.54 ng/mL (ref <=4.00)

## 2016-01-16 ENCOUNTER — Other Ambulatory Visit: Payer: Self-pay | Admitting: Endocrinology

## 2016-01-16 ENCOUNTER — Other Ambulatory Visit: Payer: Self-pay | Admitting: Internal Medicine

## 2016-01-17 ENCOUNTER — Encounter: Payer: Self-pay | Admitting: Internal Medicine

## 2016-01-17 NOTE — Progress Notes (Signed)
[facility]   Lucky Cowboy, M.D.    Dyanne Carrel. Steffanie Dunn, P.A.-C      Terri Piedra, P.A.-C   [facility]                [address]                [place], [place]. [address] Telephone [phone] Telefax [phone]  Annual  Screening/Preventative Visit And Comprehensive Evaluation & Examination     This very nice 82 y.o. [facility] presents for a Wellness/Preventative Visit & comprehensive evaluation and management of multiple medical co-morbidities.  Patient has been followed for HTN, T2_NIDDM  w/CKD3, Hyperlipidemia and Vitamin D Deficiency. Patient has mild to moderate SDAT and has affairs & meds managed & supervised by his children.      HTN predates circa 1992. Patient's BP has been controlled at home.Today's BP: 126/64. Patient denies any cardiac symptoms as chest pain, palpitations, shortness of breath, dizziness or ankle swelling.     Patient's hyperlipidemia is controlled with diet and medications. Patient denies myalgias or other medication SE's. Last lipids were at goal: Lab Results  Component Value Date   CHOL 119 (L) 01/12/2016   HDL 38 (L) 01/12/2016   LDLCALC 41 01/12/2016   TRIG 198 (H) 01/12/2016   CHOLHDL 3.1 01/12/2016      Patient has T2_NIDDM treated since 1975 with oral agents and then started on insulin in 1997    and patient denies reactive hypoglycemic symptoms, visual blurring, diabetic polys or paresthesias. Patient is followed by Dr Everardo All Last A1c was not at idel goal , but felt sufficient control as limited by his dementia and concern for precipitous hypoglycemia with "tight" control.  Lab Results  Component Value Date   HGBA1C 7.5 (H) 01/12/2016       Finally, patient has history of Vitamin D Deficiency of "28" in 2008 and last vitamin D was  Lab Results  Component Value Date   VD25OH 40 01/12/2016   Current Outpatient Prescriptions on File Prior to Visit  Medication Sig  .  acetaminophen (TYLENOL) 500 MG tablet Take 500 mg by mouth every 6 (six) hours as needed.  Marland Kitchen aspirin 81 MG tablet Take 81 mg by mouth daily.  . B-D UF III MINI PEN NEEDLES 31G X 5 MM MISC USE FOR INJECTIONS AS DIRECTED  . Blood Glucose Monitoring Suppl (ONE TOUCH ULTRA 2) w/Device KIT   . Cholecalciferol (VITAMIN D3) 5000 UNITS CAPS Take 5,000 Units by mouth daily.   . hyoscyamine (LEVSIN/SL) 0.125 MG SL tablet Place 1 tablet (0.125 mg total) under the tongue every 4 (four) hours as needed.  . Insulin Detemir (LEVEMIR FLEXTOUCH) 100 UNIT/ML Pen Inject 45 Units into the skin every morning. And pen needles 1/day  . loratadine (CLARITIN) 10 MG tablet Take 10 mg by mouth daily.  Marland Kitchen LUMIGAN 0.01 % SOLN Place 1 drop into the right eye at bedtime.   . Multiple Vitamin (MULTIVITAMIN) tablet Take 1 tablet by mouth daily.    Marland Kitchen MYRBETRIQ 50 MG TB24 tablet Take 50 mg by mouth daily.   Marland Kitchen OVER THE COUNTER MEDICATION Mucinex DM 12 hour 2 tablets daily  . Probiotic Product (PROBIOTIC DAILY PO) Take 1 tablet by mouth daily.   Marland Kitchen senna-docusate (SENOKOT-S) 8.6-50 MG tablet Take 1 tablet by mouth at bedtime. (Patient taking differently: Take 1 tablet by mouth daily. )  . SIMBRINZA 1-0.2 % SUSP Place 1 drop into both  eyes 3 (three) times daily.   No current facility-administered medications on file prior to visit.    Allergies  Allergen Reactions  . Ace Inhibitors Other (See Comments) and Cough    Chronic cough  . Atenolol     Atenolol eye drops  . Demerol Other (See Comments)    Hard to wake up  . Flomax [Tamsulosin Hcl] Other (See Comments)    Low BP  . Morphine And Related Other (See Comments)    hallucinations  . Oxytrol [Oxybutynin] Rash   Past Medical History:  Diagnosis Date  . CKD stage 4 due to type 2 diabetes mellitus (HCC) 06/03/2015  . Dyslipidemia associated with type 2 diabetes mellitus (HCC) 05/05/2015  . Essential hypertension 05/05/2015  . IDDM (insulin dependent diabetes mellitus)  (HCC)   . SDAT (senile dementia of Alzheimer's type) 06/16/2013  . Uncontrolled insulin-dependent diabetes mellitus with renal manifestation (HCC) 05/05/2015   Health Maintenance  Topic Date Due  . ZOSTAVAX  11/09/1987  . INFLUENZA VACCINE  01/19/2016  . OPHTHALMOLOGY EXAM  02/26/2016  . HEMOGLOBIN A1C  07/14/2016  . FOOT EXAM  01/11/2017  . URINE MICROALBUMIN  01/11/2017  . TETANUS/TDAP  08/29/2018  . PNA vac Low Risk Adult  Completed   Immunization History  Administered Date(s) Administered  . DTaP 09/28/2006  . Influenza Whole 04/05/2010, 04/04/2013  . Influenza, High Dose Seasonal PF 04/02/2014, 03/30/2015  . Influenza-Unspecified 02/19/2011  . Pneumococcal Conjugate-13 06/10/2014  . Pneumococcal Polysaccharide-23 07/21/2001, 03/20/2008  . Td 08/28/2008   Past Surgical History:  Procedure Laterality Date  . CARDIAC CATHETERIZATION  04/2004   normal r-sided pressure, mild pulm HTN  . CARPAL TUNNEL RELEASE Right   . CERVICAL SPINE SURGERY  1990's  . LUMBAR SPINE SURGERY  2003  . PROSTATE SURGERY  10/2010   transurethral resection   . TOTAL HIP ARTHROPLASTY Left 01/17/2014   Procedure: LEFT TOTAL HIP ARTHROPLASTY;  Surgeon: Thera Flake., MD;  Location: [facility];  Service: Orthopedics;  Laterality: Left;  . TRANSTHORACIC ECHOCARDIOGRAM  05/2006   EF 60-70%; mild calcif of MV; mild MV regurg; LA mildly dilated  . TRANSURETHRAL RESECTION OF PROSTATE N/A 08/22/2014   Procedure: TRANSURETHRAL RESECTION OF THE PROSTATE (TURP);  Surgeon: Katharine Look, MD;  Location: [facility];  Service: Urology;  Laterality: N/A;   Family History  Problem Relation Age of Onset  . Diabetes Mother   . Hypertension Mother   . Prostate cancer Father   . Heart disease Father   . Diabetes Brother   . Cancer Brother   . Heart disease Brother   . Diabetes Sister   . Heart disease Sister   . Hyperlipidemia Child   . Hypertension Child   . Diabetes Child   . Heart attack Child   . Cancer Child     Social History   Social History  . Marital status: Widowed    Spouse name: N/A  . Number of children: 5  . Years of education: master's   Occupational History  . teacher/instructor Retired   Social History Main Topics  . Smoking status: Former Smoker    Quit date: 06/21/1983  . Smokeless tobacco: Never Used  . Alcohol use No  . Drug use: No  . Sexual activity: No   Other Topics Concern  . Not on file   Social History Narrative  . No narrative on file    ROS Constitutional: Denies fever, chills, weight loss/gain, headaches, insomnia,  night sweats or change  in appetite. Does c/o fatigue. Eyes: Denies redness, blurred vision, diplopia, discharge, itchy or watery eyes.  ENT: Denies discharge, congestion, post nasal drip, epistaxis, sore throat, earache, hearing loss, dental pain, Tinnitus, Vertigo, Sinus pain or snoring.  Cardio: Denies chest pain, palpitations, irregular heartbeat, syncope, dyspnea, diaphoresis, orthopnea, PND, claudication or edema Respiratory: denies cough, dyspnea, DOE, pleurisy, hoarseness, laryngitis or wheezing.  Gastrointestinal: Denies dysphagia, heartburn, reflux, water brash, pain, cramps, nausea, vomiting, bloating, diarrhea, constipation, hematemesis, melena, hematochezia, jaundice or hemorrhoids Genitourinary: Denies dysuria, frequency, urgency, nocturia, hesitancy, discharge, hematuria or flank pain Musculoskeletal: Denies arthralgia, myalgia, stiffness, Jt. Swelling, pain, limp or strain/sprain. Denies Falls. Skin: Denies puritis, rash, hives, warts, acne, eczema or change in skin lesion Neuro: No weakness, tremor, incoordination, spasms, paresthesia or pain Psychiatric: Denies confusion, memory loss or sensory loss. Denies Depression. Endocrine: Denies change in weight, skin, hair change, nocturia, and paresthesia, diabetic polys, visual blurring or hyper / hypo glycemic episodes.  Heme/Lymph: No excessive bleeding, bruising or enlarged lymph  nodes.  Physical Exam  BP 126/64   Pulse 60   Temp 97.4 F (36.3 C)   Resp 16   Ht 5' 10.5" (1.791 m)   Wt 208 lb 9.6 oz (94.6 kg)   BMI 29.51 kg/m   General Appearance: Well nourished, in no apparent distress. Eyes: PERRLA, EOMs, conjunctiva no swelling or erythema, normal fundi and vessels. Sinuses: No frontal/maxillary tenderness ENT/Mouth: EACs patent / TMs  nl. Nares clear without erythema, swelling, mucoid exudates. Oral hygiene is good. No erythema, swelling, or exudate. Tongue normal, non-obstructing. Tonsils not swollen or erythematous. Hearing normal.  Neck: Supple, thyroid normal. No bruits, nodes or JVD. Respiratory: Respiratory effort normal.  BS equal and clear bilateral without rales, rhonci, wheezing or stridor. Cardio: Heart sounds are normal with regular rate and rhythm and no murmurs, rubs or gallops. Peripheral pulses are normal and equal bilaterally without edema. No aortic or femoral bruits. Chest: symmetric with normal excursions and percussion.  Abdomen: Soft, with Nl bowel sounds. Nontender, no guarding, rebound, hernias, masses, or organomegaly.  Lymphatics: Non tender without lymphadenopathy.  Genitourinary: No hernias.Testes nl. DRE - prostate nl for age - smooth & firm w/o nodules. Musculoskeletal: Full ROM all peripheral extremities, joint stability, 5/5 strength, and normal gait. Skin: Warm and dry without rashes, lesions, cyanosis, clubbing or  ecchymosis.  Neuro: Cranial nerves intact, reflexes equal bilaterally. Normal muscle tone, no cerebellar symptoms. Sensation intact toi touch, Vibratory & Monofilament to the toes bilaterally.  Pysch: Alert and oriented X 3 with normal affect, but insight and judgment very limited by his dementia.   Assessment and Plan  1. Annual Preventative/Screening Exam   - Microalbumin / creatinine urine ratio - EKG 12-Lead - [place], RETROPERITNL ABD,  LTD - POC Hemoccult Bld/Stl  - Urinalysis, Routine w reflex microscopic   - PSA - Uric acid - HM DIABETES FOOT EXAM - LOW EXTREMITY NEUR EXAM DOCUM - CBC with Differential/Platelet - BASIC METABOLIC PANEL WITH GFR - Hepatic function panel - Magnesium - Lipid panel - TSH - Hemoglobin A1c - VITAMIN D 25 Hydroxy  2. Essential hypertension  - EKG 12-Lead - [place], RETROPERITNL ABD,  LTD - TSH  3. Hyperlipidemia  - EKG 12-Lead - [place], RETROPERITNL ABD,  LTD - Lipid panel - TSH  4. Insulin-requiring or dependent type II diabetes mellitus ([place])  - Microalbumin / creatinine urine ratio - EKG 12-Lead - [place], RETROPERITNL ABD,  LTD - [facility] DIABETES FOOT EXAM - LOW EXTREMITY NEUR EXAM  DOCUM - Hemoglobin A1c  5. Vitamin D deficiency  - VITAMIN D 25 Hydroxy   6. SDAT (senile dementia of Alzheimer's type)   7. CKD stage 3 due to type 2 diabetes mellitus ([place])   8. Diabetic retinopathy without macular edema associated with diabetes mellitus due to underlying condition, unspecified retinopathy severity ([place])   9. Medication management  - Urinalysis, Routine w reflex microscopic  - Uric acid - CBC with Differential/Platelet - BASIC METABOLIC PANEL WITH GFR - Hepatic function panel - Magnesium  10. Prostate cancer ([place])   - PSA  11. Encounter for screening for malignant neoplasm of rectum  - POC Hemoccult Bld/Stl   12. Screening for ischemic heart disease  - EKG 12-Lead  13. Screening for AAA (aortic abdominal aneurysm)  - [place], RETROPERITNL ABD,  LTD   Continue prudent diet as discussed, weight control, BP monitoring, regular exercise, and medications as discussed.  Discussed med effects and SE's. Routine screening labs and tests as requested with regular follow-up as recommended. Over 40 minutes of exam, counseling, chart review and high complex critical decision making was performed

## 2016-02-03 ENCOUNTER — Other Ambulatory Visit: Payer: Self-pay

## 2016-02-03 DIAGNOSIS — Z0001 Encounter for general adult medical examination with abnormal findings: Secondary | ICD-10-CM

## 2016-02-03 DIAGNOSIS — Z1212 Encounter for screening for malignant neoplasm of rectum: Secondary | ICD-10-CM

## 2016-02-03 LAB — POC HEMOCCULT BLD/STL (HOME/3-CARD/SCREEN)
Card #3 Fecal Occult Blood, POC: NEGATIVE
FECAL OCCULT BLD: NEGATIVE
Fecal Occult Blood, POC: NEGATIVE

## 2016-02-09 ENCOUNTER — Telehealth: Payer: Self-pay | Admitting: *Deleted

## 2016-02-09 NOTE — Telephone Encounter (Signed)
Pt aware of lab results 

## 2016-02-27 IMAGING — CT CT CERVICAL SPINE W/O CM
4 of 6 series · 14 of 33 positions shown, 16 images · non-contrast
Comparison: None.

CLINICAL DATA: Status post fall the night of 05/04/2015. Initial
encounter.

EXAM:
CT HEAD WITHOUT CONTRAST
CT CERVICAL SPINE WITHOUT CONTRAST
TECHNIQUE: Multidetector CT imaging of the head and cervical spine was
performed following the standard protocol without intravenous
contrast. Multiplanar CT image reconstructions of the cervical spine
were also generated.

[Series 5: c-spine st · axial · 0.29mm/px · z∈[-218,-118]mm · 3 of 100 slices shown, 4 images]
[im 25/100  soft-tissue]
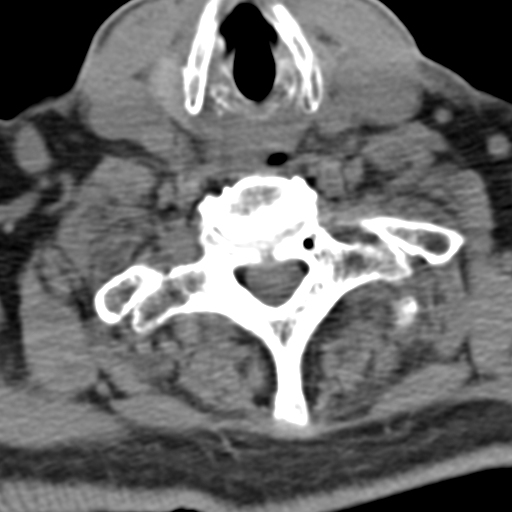
[im 25/100  bone]
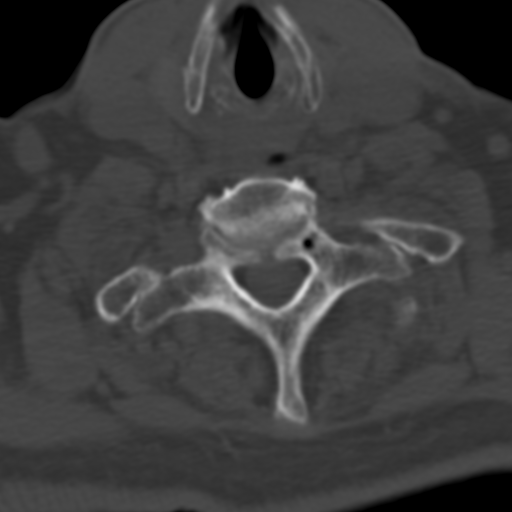
[im 50/100  bone]
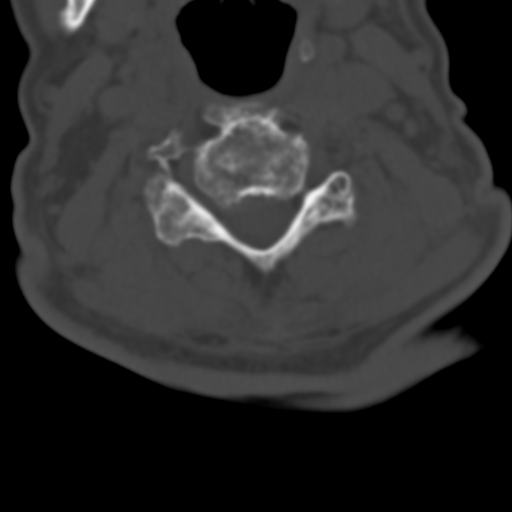
[im 75/100  bone]
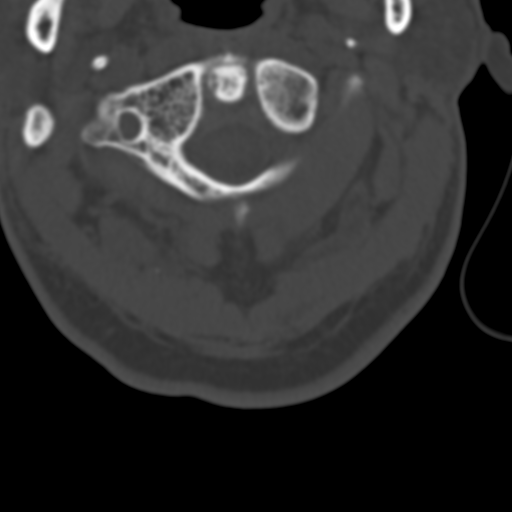

[Series 602: <mpr thick range> · coronal · 0.39mm/px · 3 of 46 slices shown]
[im 10/46  bone]
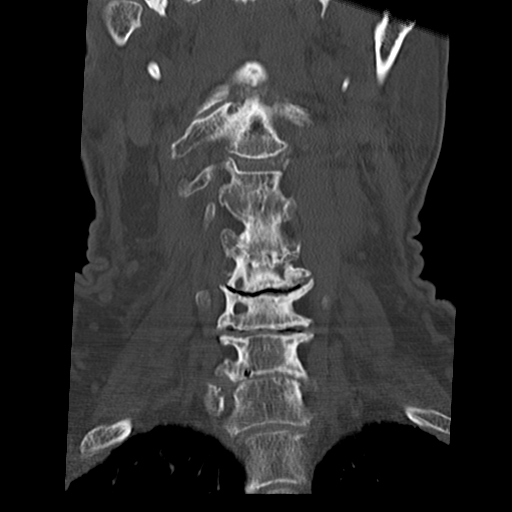
[im 19/46  bone]
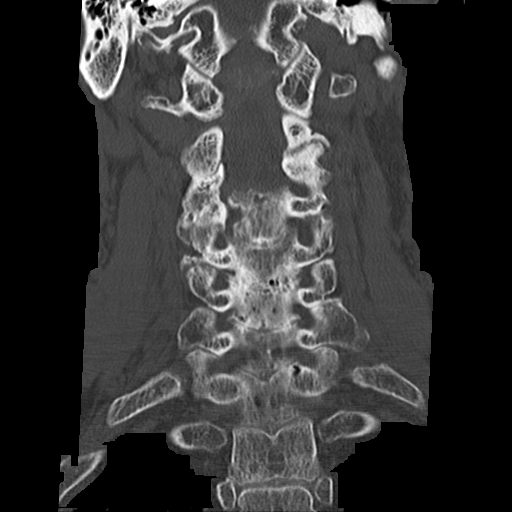
[im 28/46  bone]
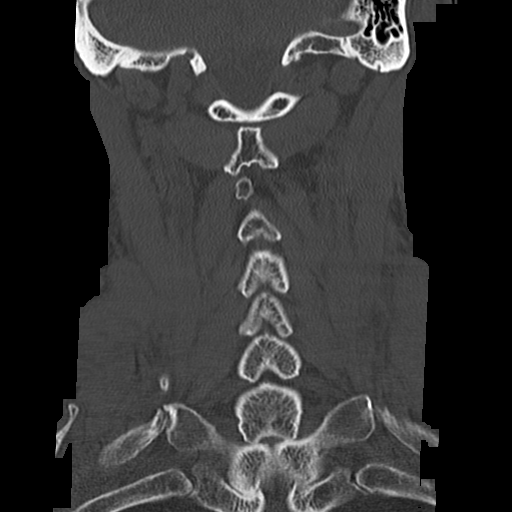

[Series 603: <mpr thick range(1)> · axial · 0.39mm/px · z∈[-258,-166]mm · 3 of 105 slices shown]
[im 27/105  bone]
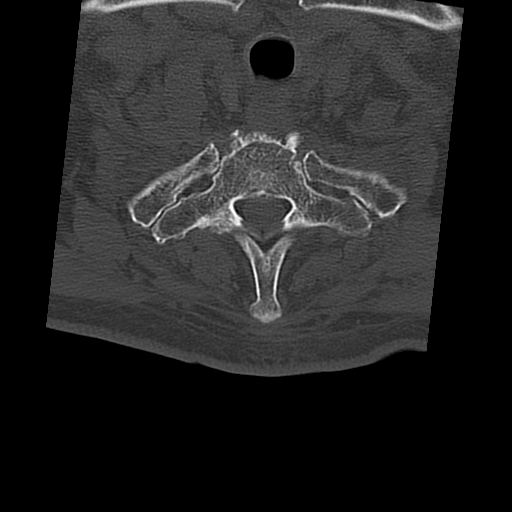
[im 53/105  bone]
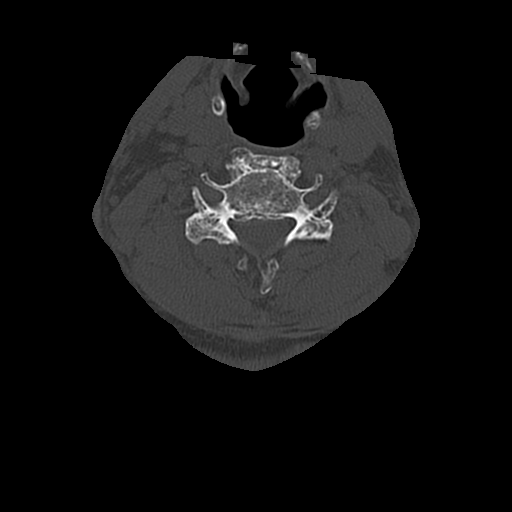
[im 79/105  bone]
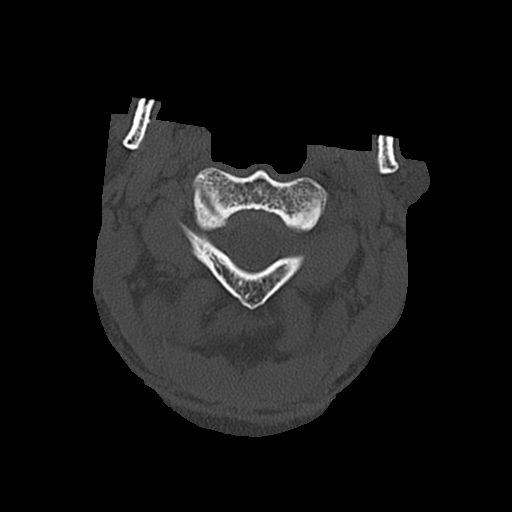

[Series 604: <mpr thick range(2)> · sagittal · 0.39mm/px · 5 of 64 slices shown, 6 images]
[im 22/64  bone]
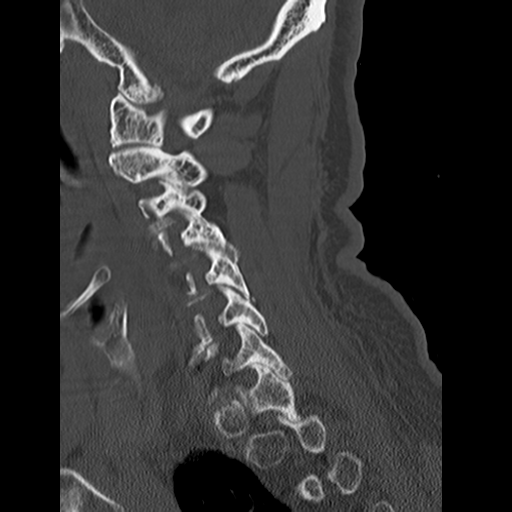
[im 27/64  bone]
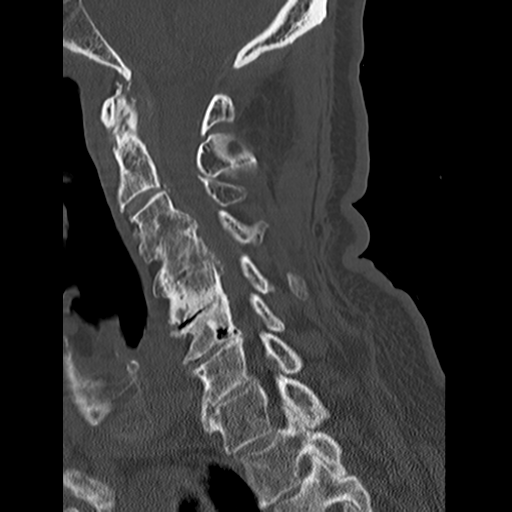
[im 32/64  soft-tissue]
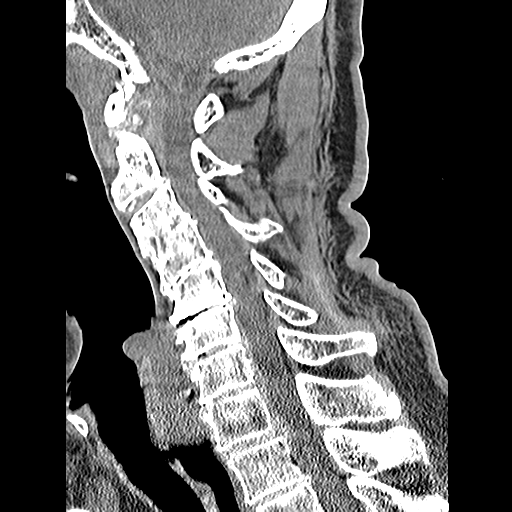
[im 32/64  bone]
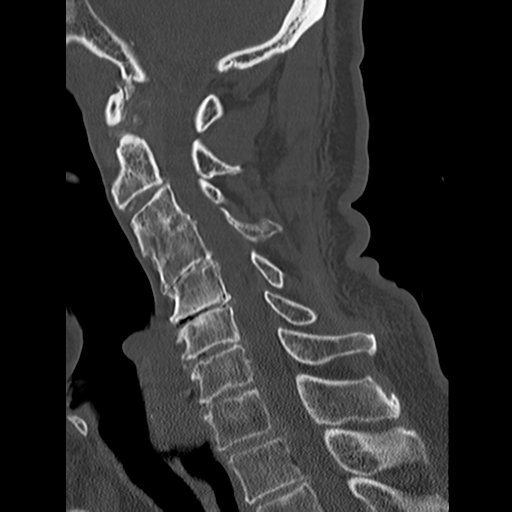
[im 37/64  bone]
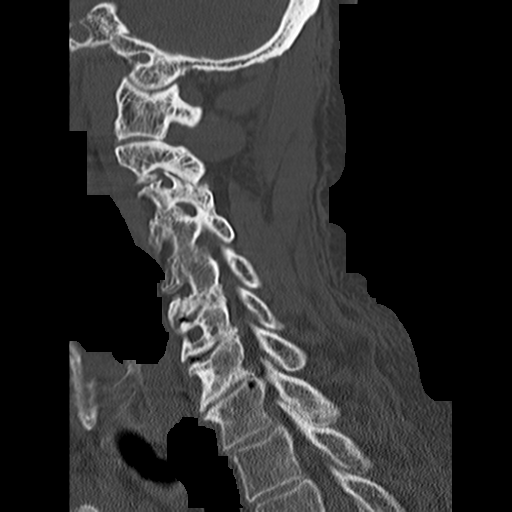
[im 43/64  bone]
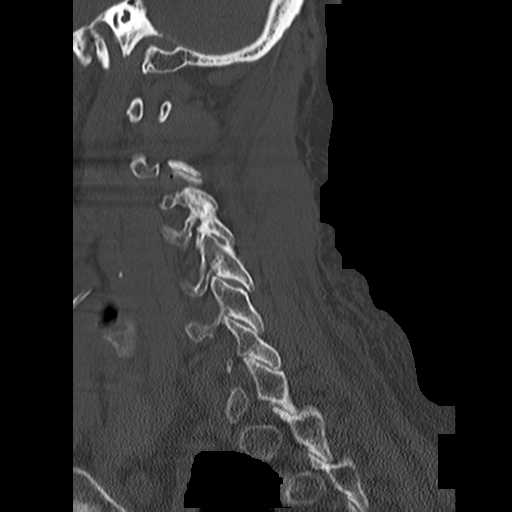

[14 of 33 positions shown; findings below may reference images not displayed]

FINDINGS: CT HEAD FINDINGS

Chronic microvascular ischemic change and mild cortical atrophy are
identified. There is no evidence of acute intracranial abnormality
including hemorrhage, infarct, mass lesion, mass effect, midline
shift or abnormal extra-axial fluid collection. No hydrocephalus or
pneumocephalus. The calvarium is intact. Imaged paranasal sinuses
and mastoid air cells are clear.

CT CERVICAL SPINE FINDINGS

No cervical spine fracture is identified. Autologous fusion of the
C3-4 and C4-5 levels is identified. There is trace anterolisthesis
C3 on C4 and C4 on C5. Marked loss of disc space height is present
at C5-6 and C6-7. Multilevel facet arthropathy is noted. Lung apices
are clear.
IMPRESSION: No acute abnormality head or cervical spine.

Chronic microvascular ischemic change and mild atrophy.

Cervical spondylosis.

## 2016-03-01 ENCOUNTER — Ambulatory Visit: Payer: Medicare Other | Admitting: Endocrinology

## 2016-03-07 ENCOUNTER — Ambulatory Visit (INDEPENDENT_AMBULATORY_CARE_PROVIDER_SITE_OTHER): Payer: Medicare Other | Admitting: Endocrinology

## 2016-03-07 ENCOUNTER — Encounter: Payer: Self-pay | Admitting: Endocrinology

## 2016-03-07 VITALS — BP 128/70 | HR 61 | Ht 70.5 in | Wt 210.0 lb

## 2016-03-07 DIAGNOSIS — Z794 Long term (current) use of insulin: Secondary | ICD-10-CM | POA: Diagnosis not present

## 2016-03-07 DIAGNOSIS — E119 Type 2 diabetes mellitus without complications: Secondary | ICD-10-CM | POA: Diagnosis not present

## 2016-03-07 MED ORDER — INSULIN DETEMIR 100 UNIT/ML FLEXPEN: 45.0000 [IU] | PEN_INJECTOR | SUBCUTANEOUS | 11 refills | Status: DC

## 2016-03-07 NOTE — Progress Notes (Signed)
Subjective:    Patient ID: Gregory Hatfield, male    DOB: 06-10-1926, 82 y.o.   MRN: [id number]  HPI Pt returns for f/u of diabetes mellitus: DM type: Insulin-requiring type 2 Dx'ed: 5465 Complications: renal insuff, retinopathy, and CHF.   Therapy: insulin since 2003 DKA: never Severe hypoglycemia: never.   Pancreatitis: never. Other: he is on a QD insulin regimen, due to noncompliance with multiple daily injections.   Interval history: no cbg record, but states cbg's vary from 80-315. There is no trend throughout the day, but it is lowest after he misses a meal.  pt states he feels well in general. Pt says he never misses the insulin.   Past Medical History:  Diagnosis Date  . CKD stage 4 due to type 2 diabetes mellitus ([place]) 06/03/2015  . Dyslipidemia associated with type 2 diabetes mellitus ([place]) 05/05/2015  . Essential hypertension 05/05/2015  . IDDM (insulin dependent diabetes mellitus) ([place])   . SDAT (senile dementia of Alzheimer's type) 06/16/2013  . Uncontrolled insulin-dependent diabetes mellitus with renal manifestation ([place]) 05/05/2015    Past Surgical History:  Procedure Laterality Date  . CARDIAC CATHETERIZATION  04/2004   normal r-sided pressure, mild pulm HTN  . CARPAL TUNNEL RELEASE Right   . CERVICAL SPINE SURGERY  1990's  . LUMBAR SPINE SURGERY  2003  . PROSTATE SURGERY  10/2010   transurethral resection   . TOTAL HIP ARTHROPLASTY Left 01/17/2014   Procedure: LEFT TOTAL HIP ARTHROPLASTY;  Surgeon: Yvette Rack., MD;  Location: [facility];  Service: Orthopedics;  Laterality: Left;  . TRANSTHORACIC ECHOCARDIOGRAM  05/2006   EF 60-70%; mild calcif of MV; mild MV regurg; LA mildly dilated  . TRANSURETHRAL RESECTION OF PROSTATE N/A 08/22/2014   Procedure: TRANSURETHRAL RESECTION OF THE PROSTATE (TURP);  Surgeon: Marcia Brash, MD;  Location: [facility];  Service: Urology;  Laterality: N/A;    Social History   Social History  . Marital status: Widowed    Spouse  name: N/A  . Number of children: 5  . Years of education: master's   Occupational History  . teacher/instructor Retired   Social History Main Topics  . Smoking status: Former Smoker    Quit date: 06/21/1983  . Smokeless tobacco: Never Used  . Alcohol use No  . Drug use: No  . Sexual activity: No   Other Topics Concern  . Not on file   Social History Narrative  . No narrative on file    Current Outpatient Prescriptions on File Prior to Visit  Medication Sig Dispense Refill  . acetaminophen (TYLENOL) 500 MG tablet Take 500 mg by mouth every 6 (six) hours as needed.    Marland Kitchen aspirin 81 MG tablet Take 81 mg by mouth daily.    . B-D UF III MINI PEN NEEDLES 31G X 5 MM MISC USE FOR INJECTIONS AS DIRECTED 100 each 1  . Blood Glucose Monitoring Suppl (ONE TOUCH ULTRA 2) w/Device KIT     . Cholecalciferol (VITAMIN D3) 5000 UNITS CAPS Take 5,000 Units by mouth daily.     . furosemide (LASIX) 40 MG tablet TAKE 1 OR 2 TABLETS ONCE DAILY FOR BLOOD PRESSURE AND ANKLE SWELLING. 360 tablet 1  . hydrALAZINE (APRESOLINE) 25 MG tablet TAKE ONE TABLET 3 TIMES A DAY FOR BLOOD PRESSURE. 270 tablet 1  . hyoscyamine (LEVSIN/SL) 0.125 MG SL tablet Place 1 tablet (0.125 mg total) under the tongue every 4 (four) hours as needed. 30 tablet 0  . loratadine (CLARITIN)  10 MG tablet Take 10 mg by mouth daily.    Marland Kitchen LUMIGAN 0.01 % SOLN Place 1 drop into the right eye at bedtime.     . Multiple Vitamin (MULTIVITAMIN) tablet Take 1 tablet by mouth daily.      Marland Kitchen MYRBETRIQ 50 MG TB24 tablet Take 50 mg by mouth daily.     . ONE TOUCH ULTRA TEST test strip CHECK BLOOD SUGAR 6 TIMES A DAY. 180 each 11  . OVER THE COUNTER MEDICATION Mucinex DM 12 hour 2 tablets daily    . pravastatin (PRAVACHOL) 40 MG tablet TAKE 1 TABLET ONCE DAILY FOR CHOLESTEROL. 90 tablet 1  . Probiotic Product (PROBIOTIC DAILY PO) Take 1 tablet by mouth daily.     Marland Kitchen senna-docusate (SENOKOT-S) 8.6-50 MG tablet Take 1 tablet by mouth at bedtime. (Patient  taking differently: Take 1 tablet by mouth daily. )    . SIMBRINZA 1-0.2 % SUSP Place 1 drop into both eyes 3 (three) times daily.     No current facility-administered medications on file prior to visit.     Allergies  Allergen Reactions  . Ace Inhibitors Other (See Comments) and Cough    Chronic cough  . Atenolol     Atenolol eye drops  . Demerol Other (See Comments)    Hard to wake up  . Flomax [Tamsulosin Hcl] Other (See Comments)    Low BP  . Morphine And Related Other (See Comments)    hallucinations  . Oxytrol [Oxybutynin] Rash    Family History  Problem Relation Age of Onset  . Diabetes Mother   . Hypertension Mother   . Prostate cancer Father   . Heart disease Father   . Diabetes Brother   . Cancer Brother   . Heart disease Brother   . Diabetes Sister   . Heart disease Sister   . Hyperlipidemia Child   . Hypertension Child   . Diabetes Child   . Heart attack Child   . Cancer Child    BP 128/70   Pulse 61   Ht 5' 10.5" (1.791 m)   Wt 210 lb (95.3 kg)   SpO2 97%   BMI 29.71 kg/m   Review of Systems He denies hypoglycemia.      Objective:   Physical Exam VITAL SIGNS:  See vs page.  GENERAL: no distress.  Pulses: dorsalis pedis intact bilat.  CV: 2+ bilat leg edema.  Feet: no deformity. There is spotty hyperpigmentation on the legs and feet. There is bilateral onychomycosis.  Skin: feet are of normal color and temp, but the skin is dry and scaly. no ulcer on the feet.  Neuro: sensation is intact to touch on the feet.   Lab Results  Component Value Date   HGBA1C 7.5 (H) 01/12/2016   Lab Results  Component Value Date   CREATININE 1.89 (H) 01/12/2016   BUN 30 (H) 01/12/2016   NA 139 01/12/2016   K 4.0 01/12/2016   CL 105 01/12/2016   CO2 23 01/12/2016      Assessment & Plan:  Insulin-requiring type 2 DM: this is the best control this pt should aim for, given this regimen, which does match insulin to his changing needs throughout the day.

## 2016-03-07 NOTE — Patient Instructions (Signed)
check your blood sugar 6 times a day--before the 3 meals, and at bedtime.  also check if you have symptoms of your blood sugar being too high or too low.  please keep a record of the readings and bring it to your next appointment here.  please call us sooner if you are having low blood sugar episodes.   On this type of insulin schedule, you should eat meals on a regular schedule.  If a meal is missed or significantly delayed, your blood sugar could go low. To avoid the lows, carry some packets of cheese and crackers.  This way, you can avoid the lows.   Please continue the same insulin.   Please come back for a follow-up appointment in 4 months.

## 2016-04-12 ENCOUNTER — Ambulatory Visit (INDEPENDENT_AMBULATORY_CARE_PROVIDER_SITE_OTHER): Payer: Medicare Other | Admitting: Sports Medicine

## 2016-04-12 ENCOUNTER — Encounter: Payer: Self-pay | Admitting: Sports Medicine

## 2016-04-12 DIAGNOSIS — M79676 Pain in unspecified toe(s): Secondary | ICD-10-CM

## 2016-04-12 DIAGNOSIS — M79672 Pain in left foot: Secondary | ICD-10-CM

## 2016-04-12 DIAGNOSIS — B351 Tinea unguium: Secondary | ICD-10-CM

## 2016-04-12 DIAGNOSIS — E119 Type 2 diabetes mellitus without complications: Secondary | ICD-10-CM | POA: Diagnosis not present

## 2016-04-12 DIAGNOSIS — M79671 Pain in right foot: Secondary | ICD-10-CM

## 2016-04-12 NOTE — Progress Notes (Signed)
Patient ID: Gregory Hatfield, male   DOB: 06/10/1926, 82 y.o.   MRN: [id number]  Subjective: Gregory Hatfield is a 82 y.o. male patient with history of type 2 diabetes who presents to office today complaining of long, painful nails  while ambulating in shoes; unable to trim. Patient states that the glucose reading this morning was not recorded but has been up and down. Patient denies any new changes in medication or new problems. Patient denies any new cramping, numbness, burning or tingling in the legs.  A1c is down to 8.5 per patient report.  Patient Active Problem List   Diagnosis Date Noted  . Hyperlipidemia 01/12/2016  . Prostate cancer (HCC) 10/12/2015  . Diabetic retinopathy (HCC) 10/12/2015  . Encounter for Medicare annual wellness exam 10/12/2015  . Open-angle glaucoma 07/06/2015  . Vitamin D deficiency 06/03/2015  . Medication management 06/03/2015  . CKD stage 3 due to type 2 diabetes mellitus (HCC) 06/03/2015  . Insulin-requiring or dependent type II diabetes mellitus (HCC) 05/05/2015  . Essential hypertension 05/05/2015  . SDAT (senile dementia of Alzheimer's type) 06/16/2013   Current Outpatient Prescriptions on File Prior to Visit  Medication Sig Dispense Refill  . acetaminophen (TYLENOL) 500 MG tablet Take 500 mg by mouth every 6 (six) hours as needed.    Marland Kitchen aspirin 81 MG tablet Take 81 mg by mouth daily.    . B-D UF III MINI PEN NEEDLES 31G X 5 MM MISC USE FOR INJECTIONS AS DIRECTED 100 each 1  . Blood Glucose Monitoring Suppl (ONE TOUCH ULTRA 2) w/Device KIT     . Cholecalciferol (VITAMIN D3) 5000 UNITS CAPS Take 5,000 Units by mouth daily.     . furosemide (LASIX) 40 MG tablet TAKE 1 OR 2 TABLETS ONCE DAILY FOR BLOOD PRESSURE AND ANKLE SWELLING. 360 tablet 1  . hydrALAZINE (APRESOLINE) 25 MG tablet TAKE ONE TABLET 3 TIMES A DAY FOR BLOOD PRESSURE. 270 tablet 1  . hyoscyamine (LEVSIN/SL) 0.125 MG SL tablet Place 1 tablet (0.125 mg total) under the tongue every 4 (four)  hours as needed. 30 tablet 0  . Insulin Detemir (LEVEMIR FLEXTOUCH) 100 UNIT/ML Pen Inject 45 Units into the skin every morning. And pen needles 1/day 30 mL 11  . loratadine (CLARITIN) 10 MG tablet Take 10 mg by mouth daily.    Marland Kitchen LUMIGAN 0.01 % SOLN Place 1 drop into the right eye at bedtime.     . Multiple Vitamin (MULTIVITAMIN) tablet Take 1 tablet by mouth daily.      Marland Kitchen MYRBETRIQ 50 MG TB24 tablet Take 50 mg by mouth daily.     . ONE TOUCH ULTRA TEST test strip CHECK BLOOD SUGAR 6 TIMES A DAY. 180 each 11  . OVER THE COUNTER MEDICATION Mucinex DM 12 hour 2 tablets daily    . pravastatin (PRAVACHOL) 40 MG tablet TAKE 1 TABLET ONCE DAILY FOR CHOLESTEROL. 90 tablet 1  . Probiotic Product (PROBIOTIC DAILY PO) Take 1 tablet by mouth daily.     Marland Kitchen senna-docusate (SENOKOT-S) 8.6-50 MG tablet Take 1 tablet by mouth at bedtime. (Patient taking differently: Take 1 tablet by mouth daily. )    . SIMBRINZA 1-0.2 % SUSP Place 1 drop into both eyes 3 (three) times daily.     No current facility-administered medications on file prior to visit.    Allergies  Allergen Reactions  . Ace Inhibitors Other (See Comments) and Cough    Chronic cough  . Atenolol     Atenolol eye  drops  . Demerol Other (See Comments)    Hard to wake up  . Flomax [Tamsulosin Hcl] Other (See Comments)    Low BP  . Morphine And Related Other (See Comments)    hallucinations  . Oxytrol [Oxybutynin] Rash    Objective: General: Patient is awake, alert, and oriented x 3 and in no acute distress.  Integument: Skin is warm, dry and supple bilateral. Nails are tender, long, thickened and  dystrophic with subungual debris, consistent with onychomycosis, 1-5 bilateral. No signs of infection. No open lesions or preulcerative lesions present bilateral. Remaining integument unremarkable.  Vasculature:  Dorsalis Pedis pulse 1/4 bilateral. Posterior Tibial pulse  0/4 bilateral. No ischemia or gangrene. Capillary fill time <3 sec 1-5  bilateral. Positive hair growth to the level of the digits. Temperature gradient within normal limits. No varicosities present bilateral. Trace edema present bilateral.   Neurology: The patient has intact sensation measured with a 5.07/10g Semmes Weinstein Monofilament at all pedal sites bilateral . Vibratory sensation diminished bilateral with tuning fork. No Babinski sign present bilateral.   Musculoskeletal: No symptomatic pedal deformities noted bilateral. Muscular strength 5/5 in all lower extremity muscular groups bilateral without pain or limitation on range of motion. No tenderness with calf compression bilateral.  Assessment and Plan: Problem List Items Addressed This Visit    None    Visit Diagnoses    Pain due to onychomycosis of toenail    -  Primary   Diabetes mellitus without complication (HCC)       Foot pain, bilateral         -Examined patient. -Discussed and educated patient on diabetic foot care, especially with  regards to the vascular, neurological and musculoskeletal systems.  -Stressed the importance of good glycemic control and the detriment of not  controlling glucose levels in relation to the foot. -Mechanically debrided all nails 1-5 bilateral using sterile nail nipper and filed with dremel without incident  -Answered all patient questions -Patient to return in 3 months for at risk foot care -Patient advised to call the office if any problems or questions arise in the meantime.  Landis Martins, DPM

## 2016-04-14 ENCOUNTER — Encounter: Payer: Self-pay | Admitting: Internal Medicine

## 2016-04-14 ENCOUNTER — Ambulatory Visit (INDEPENDENT_AMBULATORY_CARE_PROVIDER_SITE_OTHER): Payer: Medicare Other | Admitting: Internal Medicine

## 2016-04-14 VITALS — BP 138/66 | HR 62 | Temp 98.2°F | Resp 16 | Ht 70.5 in | Wt 212.0 lb

## 2016-04-14 DIAGNOSIS — H6122 Impacted cerumen, left ear: Secondary | ICD-10-CM

## 2016-04-14 DIAGNOSIS — Z794 Long term (current) use of insulin: Secondary | ICD-10-CM | POA: Diagnosis not present

## 2016-04-14 DIAGNOSIS — E782 Mixed hyperlipidemia: Secondary | ICD-10-CM

## 2016-04-14 DIAGNOSIS — I10 Essential (primary) hypertension: Secondary | ICD-10-CM | POA: Diagnosis not present

## 2016-04-14 DIAGNOSIS — Z79899 Other long term (current) drug therapy: Secondary | ICD-10-CM

## 2016-04-14 DIAGNOSIS — E08319 Diabetes mellitus due to underlying condition with unspecified diabetic retinopathy without macular edema: Secondary | ICD-10-CM

## 2016-04-14 DIAGNOSIS — N183 Chronic kidney disease, stage 3 unspecified: Secondary | ICD-10-CM

## 2016-04-14 DIAGNOSIS — E119 Type 2 diabetes mellitus without complications: Secondary | ICD-10-CM

## 2016-04-14 DIAGNOSIS — E559 Vitamin D deficiency, unspecified: Secondary | ICD-10-CM

## 2016-04-14 DIAGNOSIS — E1122 Type 2 diabetes mellitus with diabetic chronic kidney disease: Secondary | ICD-10-CM

## 2016-04-14 LAB — HEPATIC FUNCTION PANEL
ALK PHOS: 60 U/L (ref 40–115)
ALT: 25 U/L (ref 9–46)
AST: 25 U/L (ref 10–35)
Albumin: 4 g/dL (ref 3.6–5.1)
BILIRUBIN DIRECT: 0.1 mg/dL (ref <=0.2)
BILIRUBIN INDIRECT: 0.3 mg/dL (ref 0.2–1.2)
BILIRUBIN TOTAL: 0.4 mg/dL (ref 0.2–1.2)
Total Protein: 7.1 g/dL (ref 6.1–8.1)

## 2016-04-14 LAB — TSH: TSH: 1.59 mIU/L (ref 0.40–4.50)

## 2016-04-14 LAB — LIPID PANEL
CHOL/HDL RATIO: 3.2 ratio (ref <=5.0)
CHOLESTEROL: 136 mg/dL (ref 125–200)
HDL: 42 mg/dL (ref >=40)
LDL CALC: 69 mg/dL (ref <130)
Triglycerides: 125 mg/dL (ref <150)
VLDL: 25 mg/dL (ref <30)

## 2016-04-14 LAB — BASIC METABOLIC PANEL WITH GFR
BUN: 31 mg/dL — ABNORMAL HIGH (ref 7–25)
CHLORIDE: 106 mmol/L (ref 98–110)
CO2: 26 mmol/L (ref 20–31)
Calcium: 10 mg/dL (ref 8.6–10.3)
Creat: 1.8 mg/dL — ABNORMAL HIGH (ref 0.70–1.11)
GFR, EST NON AFRICAN AMERICAN: 33 mL/min — AB (ref >=60)
GFR, Est African American: 38 mL/min — ABNORMAL LOW (ref >=60)
Glucose, Bld: 109 mg/dL — ABNORMAL HIGH (ref 65–99)
POTASSIUM: 4.3 mmol/L (ref 3.5–5.3)
SODIUM: 141 mmol/L (ref 135–146)

## 2016-04-14 LAB — CBC WITH DIFFERENTIAL/PLATELET
BASOS PCT: 0 %
Basophils Absolute: 0 cells/uL (ref 0–200)
EOS PCT: 2 %
Eosinophils Absolute: 136 cells/uL (ref 15–500)
HEMATOCRIT: 38.3 % — AB (ref 38.5–50.0)
HEMOGLOBIN: 12.6 g/dL — AB (ref 13.2–17.1)
LYMPHS ABS: 2788 {cells}/uL (ref 850–3900)
Lymphocytes Relative: 41 %
MCH: 28.9 pg (ref 27.0–33.0)
MCHC: 32.9 g/dL (ref 32.0–36.0)
MCV: 87.8 fL (ref 80.0–100.0)
MONO ABS: 544 {cells}/uL (ref 200–950)
MPV: 12.6 fL — AB (ref 7.5–12.5)
Monocytes Relative: 8 %
NEUTROS ABS: 3332 {cells}/uL (ref 1500–7800)
Neutrophils Relative %: 49 %
Platelets: 195 10*3/uL (ref 140–400)
RBC: 4.36 MIL/uL (ref 4.20–5.80)
RDW: 13.7 % (ref 11.0–15.0)
WBC: 6.8 10*3/uL (ref 3.8–10.8)

## 2016-04-14 NOTE — Progress Notes (Signed)
Assessment and Plan:  Hypertension:  -well controlled -Continue medication -monitor blood pressure at home. -Continue DASH diet -Reminder to go to the ER if any CP, SOB, nausea, dizziness, severe HA, changes vision/speech, left arm numbness and tingling and jaw pain.  Cholesterol - Continue diet and exercise -Check cholesterol.   Diabetes with diabetic chronic kidney disease  -[facility] -likely needs 70/30 mixed insulin or needs meal time coverage with an amaryl  -Continue diet and exercise.  -Check A1C  Vitamin D Def -check level -continue medications.   Cerumen impaction -ear lavage performed here  Continue diet and meds as discussed. Further disposition pending results of labs. Discussed med's effects and SE's.    HPI 82 y.o. male  presents for 3 month follow up with hypertension, hyperlipidemia, diabetes and vitamin D deficiency.   His blood pressure has been controlled at home, today their BP is BP: 138/66.He does not workout. He denies chest pain, shortness of breath, dizziness.   He is on cholesterol medication and denies myalgias. His cholesterol is at goal. The cholesterol was:  01/12/2016: Cholesterol 119; HDL 38; LDL Cholesterol 41; Triglycerides 198   He has been working on diet and exercise for diabetes with diabetic chronic kidney disease, he is on bASA, he is on ACE/ARB, and denies  foot ulcerations, hyperglycemia, hypoglycemia , increased appetite, nausea, paresthesia of the feet, polydipsia, polyuria, visual disturbances, vomiting and weight loss. Last A1C was: 01/12/2016: Hgb A1c MFr Bld 7.5.  He does follow with dr. Loanne Drilling for his diabetes.  It has been uncontrolled for some time.  He is only  On levemir.    Patient is on Vitamin D supplement. 01/12/2016: Vit D, 25-Hydroxy 65  Patient reports that he has recently been seen by his [facility] doctors and likes to stay established with them so he can get his medications for a  cheaper price.    He reports that he is seeing Dr. Loanne Drilling.  He has been having his A1C   Current Medications:  Current Outpatient Prescriptions on File Prior to Visit  Medication Sig Dispense Refill  . acetaminophen (TYLENOL) 500 MG tablet Take 500 mg by mouth every 6 (six) hours as needed.    Marland Kitchen aspirin 81 MG tablet Take 81 mg by mouth daily.    . B-D UF III MINI PEN NEEDLES 31G X 5 MM MISC USE FOR INJECTIONS AS DIRECTED 100 each 1  . Blood Glucose Monitoring Suppl (ONE TOUCH ULTRA 2) w/Device KIT     . Cholecalciferol (VITAMIN D3) 5000 UNITS CAPS Take 5,000 Units by mouth daily.     . furosemide (LASIX) 40 MG tablet TAKE 1 OR 2 TABLETS ONCE DAILY FOR BLOOD PRESSURE AND ANKLE SWELLING. 360 tablet 1  . hydrALAZINE (APRESOLINE) 25 MG tablet TAKE ONE TABLET 3 TIMES A DAY FOR BLOOD PRESSURE. 270 tablet 1  . hyoscyamine (LEVSIN/SL) 0.125 MG SL tablet Place 1 tablet (0.125 mg total) under the tongue every 4 (four) hours as needed. 30 tablet 0  . Insulin Detemir (LEVEMIR FLEXTOUCH) 100 UNIT/ML Pen Inject 45 Units into the skin every morning. And pen needles 1/day 30 mL 11  . loratadine (CLARITIN) 10 MG tablet Take 10 mg by mouth daily.    Marland Kitchen LUMIGAN 0.01 % SOLN Place 1 drop into the right eye at bedtime.     . Multiple Vitamin (MULTIVITAMIN) tablet Take 1 tablet by mouth daily.      Marland Kitchen MYRBETRIQ 50 MG TB24 tablet Take  50 mg by mouth daily.     . ONE TOUCH ULTRA TEST test strip CHECK BLOOD SUGAR 6 TIMES A DAY. 180 each 11  . OVER THE COUNTER MEDICATION Mucinex DM 12 hour 2 tablets daily    . pravastatin (PRAVACHOL) 40 MG tablet TAKE 1 TABLET ONCE DAILY FOR CHOLESTEROL. 90 tablet 1  . Probiotic Product (PROBIOTIC DAILY PO) Take 1 tablet by mouth daily.     Marland Kitchen senna-docusate (SENOKOT-S) 8.6-50 MG tablet Take 1 tablet by mouth at bedtime. (Patient taking differently: Take 1 tablet by mouth daily. )    . SIMBRINZA 1-0.2 % SUSP Place 1 drop into both eyes 3 (three) times daily.     No current  facility-administered medications on file prior to visit.    Medical History:  Past Medical History:  Diagnosis Date  . CKD stage 4 due to type 2 diabetes mellitus ([place]) 06/03/2015  . Dyslipidemia associated with type 2 diabetes mellitus ([place]) 05/05/2015  . Essential hypertension 05/05/2015  . IDDM (insulin dependent diabetes mellitus) ([place])   . SDAT (senile dementia of Alzheimer's type) 06/16/2013  . Uncontrolled insulin-dependent diabetes mellitus with renal manifestation ([place]) 05/05/2015   Allergies:  Allergies  Allergen Reactions  . Ace Inhibitors Other (See Comments) and Cough    Chronic cough  . Atenolol     Atenolol eye drops  . Demerol Other (See Comments)    Hard to wake up  . Flomax [Tamsulosin Hcl] Other (See Comments)    Low BP  . Morphine And Related Other (See Comments)    hallucinations  . Oxytrol [Oxybutynin] Rash     Review of Systems:  Review of Systems  Constitutional: Negative for chills, fever and malaise/fatigue.  HENT: Negative for congestion, ear pain and sore throat.   Eyes: Negative.   Respiratory: Negative for cough, shortness of breath and wheezing.   Cardiovascular: Negative for chest pain, palpitations and leg swelling.  Gastrointestinal: Negative for abdominal pain, blood in stool, constipation, diarrhea, heartburn and melena.  Genitourinary: Negative.   Skin: Negative.   Neurological: Negative for dizziness, sensory change, loss of consciousness and headaches.  Psychiatric/Behavioral: Negative for depression. The patient is not nervous/anxious and does not have insomnia.     Family history- Review and unchanged  Social history- Review and unchanged  Physical Exam: BP 138/66   Pulse 62   Temp 98.2 F (36.8 C) (Temporal)   Resp 16   Ht 5' 10.5" (1.791 m)   Wt 212 lb (96.2 kg)   BMI 29.99 kg/m  Wt Readings from Last 3 Encounters:  04/14/16 212 lb (96.2 kg)  03/07/16 210 lb (95.3 kg)  01/12/16 208 lb 9.6 oz (94.6 kg)   General  Appearance: Well nourished well developed, non-toxic appearing, in no apparent distress. Eyes: PERRLA, EOMs, conjunctiva no swelling or erythema ENT/Mouth: Ear canals clear with no erythema, swelling, or discharge.  TMs normal bilaterally, oropharynx clear, moist, with no exudate.   Neck: Supple, thyroid normal, no JVD, no cervical adenopathy.  Respiratory: Respiratory effort normal, breath sounds clear A&P, no wheeze, rhonchi or rales noted.  No retractions, no accessory muscle usage Cardio: RRR with no MRGs. No noted edema.  Abdomen: Soft, + BS.  Non tender, no guarding, rebound, hernias, masses. Musculoskeletal: Full ROM, 5/5 strength, Normal gait Skin: Warm, dry without rashes, lesions, ecchymosis.  Neuro: Awake and oriented X 3, Cranial nerves intact. No cerebellar symptoms.  Psych: normal affect, Insight and Judgment appropriate.    Starlyn Skeans, PA-C 3:43  PM [facility]

## 2016-04-15 LAB — HEMOGLOBIN A1C
Hgb A1c MFr Bld: 8.3 % — ABNORMAL HIGH (ref <5.7)
MEAN PLASMA GLUCOSE: 192 mg/dL

## 2016-06-29 ENCOUNTER — Encounter: Payer: Self-pay | Admitting: Endocrinology

## 2016-06-29 ENCOUNTER — Ambulatory Visit (INDEPENDENT_AMBULATORY_CARE_PROVIDER_SITE_OTHER): Payer: Medicare Other | Admitting: Endocrinology

## 2016-06-29 VITALS — BP 126/62 | HR 65 | Ht 70.5 in | Wt 193.0 lb

## 2016-06-29 DIAGNOSIS — E119 Type 2 diabetes mellitus without complications: Secondary | ICD-10-CM

## 2016-06-29 DIAGNOSIS — Z794 Long term (current) use of insulin: Secondary | ICD-10-CM | POA: Diagnosis not present

## 2016-06-29 LAB — POCT GLYCOSYLATED HEMOGLOBIN (HGB A1C): HEMOGLOBIN A1C: 8.3

## 2016-06-29 NOTE — Patient Instructions (Signed)
check your blood sugar 6 times a day--before the 3 meals, and at bedtime.  also check if you have symptoms of your blood sugar being too high or too low.  please keep a record of the readings and bring it to your next appointment here.  please call us sooner if you are having low blood sugar episodes.   On this type of insulin schedule, you should eat meals on a regular schedule.  If a meal is missed or significantly delayed, your blood sugar could go low. To avoid the lows, carry some packets of cheese and crackers.  This way, you can avoid the lows.   Please continue the same insulin.   Please come back for a follow-up appointment in 4 months.

## 2016-06-29 NOTE — Progress Notes (Signed)
Subjective:    Patient ID: Gregory Hatfield, male    DOB: 05-Sep-1925, 83 y.o.   MRN: [id number]  HPI Pt returns for f/u of diabetes mellitus: DM type: Insulin-requiring type 2 Dx'ed: 1093 Complications: renal insuff, retinopathy, and CHF.   Therapy: insulin since 2003 DKA: never Severe hypoglycemia: never.   Pancreatitis: never. Other: he is on a QD insulin regimen, due to noncompliance with multiple daily injections.   Interval history: He brings a record of his cbg's which I have reviewed today.  It varies from 65-200's. There is no trend throughout the day, but it is lowest after he misses a meal.  pt states he feels well in general. Pt says he never misses the insulin.  Past Medical History:  Diagnosis Date  . CKD stage 4 due to type 2 diabetes mellitus ([place]) 06/03/2015  . Dyslipidemia associated with type 2 diabetes mellitus ([place]) 05/05/2015  . Essential hypertension 05/05/2015  . IDDM (insulin dependent diabetes mellitus) ([place])   . SDAT (senile dementia of Alzheimer's type) 06/16/2013  . Uncontrolled insulin-dependent diabetes mellitus with renal manifestation ([place]) 05/05/2015    Past Surgical History:  Procedure Laterality Date  . CARDIAC CATHETERIZATION  04/2004   normal r-sided pressure, mild pulm HTN  . CARPAL TUNNEL RELEASE Right   . CERVICAL SPINE SURGERY  1990's  . LUMBAR SPINE SURGERY  2003  . PROSTATE SURGERY  10/2010   transurethral resection   . TOTAL HIP ARTHROPLASTY Left 01/17/2014   Procedure: LEFT TOTAL HIP ARTHROPLASTY;  Surgeon: Yvette Rack., MD;  Location: [facility];  Service: Orthopedics;  Laterality: Left;  . TRANSTHORACIC ECHOCARDIOGRAM  05/2006   EF 60-70%; mild calcif of MV; mild MV regurg; LA mildly dilated  . TRANSURETHRAL RESECTION OF PROSTATE N/A 08/22/2014   Procedure: TRANSURETHRAL RESECTION OF THE PROSTATE (TURP);  Surgeon: Marcia Brash, MD;  Location: [facility];  Service: Urology;  Laterality: N/A;    Social History   Social History  .  Marital status: Widowed    Spouse name: N/A  . Number of children: 5  . Years of education: master's   Occupational History  . teacher/instructor Retired   Social History Main Topics  . Smoking status: Former Smoker    Quit date: 06/21/1983  . Smokeless tobacco: Never Used  . Alcohol use No  . Drug use: No  . Sexual activity: No   Other Topics Concern  . Not on file   Social History Narrative  . No narrative on file    Current Outpatient Prescriptions on File Prior to Visit  Medication Sig Dispense Refill  . acetaminophen (TYLENOL) 500 MG tablet Take 500 mg by mouth every 6 (six) hours as needed.    Marland Kitchen aspirin 81 MG tablet Take 81 mg by mouth daily.    . B-D UF III MINI PEN NEEDLES 31G X 5 MM MISC USE FOR INJECTIONS AS DIRECTED 100 each 1  . Blood Glucose Monitoring Suppl (ONE TOUCH ULTRA 2) w/Device KIT     . Cholecalciferol (VITAMIN D3) 5000 UNITS CAPS Take 5,000 Units by mouth daily.     . furosemide (LASIX) 40 MG tablet TAKE 1 OR 2 TABLETS ONCE DAILY FOR BLOOD PRESSURE AND ANKLE SWELLING. 360 tablet 1  . hydrALAZINE (APRESOLINE) 25 MG tablet TAKE ONE TABLET 3 TIMES A DAY FOR BLOOD PRESSURE. 270 tablet 1  . hyoscyamine (LEVSIN/SL) 0.125 MG SL tablet Place 1 tablet (0.125 mg total) under the tongue every 4 (four) hours as needed.  30 tablet 0  . Insulin Detemir (LEVEMIR FLEXTOUCH) 100 UNIT/ML Pen Inject 45 Units into the skin every morning. And pen needles 1/day 30 mL 11  . loratadine (CLARITIN) 10 MG tablet Take 10 mg by mouth daily.    Marland Kitchen LUMIGAN 0.01 % SOLN Place 1 drop into the right eye at bedtime.     . Multiple Vitamin (MULTIVITAMIN) tablet Take 1 tablet by mouth daily.      Marland Kitchen MYRBETRIQ 50 MG TB24 tablet Take 50 mg by mouth daily.     . ONE TOUCH ULTRA TEST test strip CHECK BLOOD SUGAR 6 TIMES A DAY. 180 each 11  . OVER THE COUNTER MEDICATION Mucinex DM 12 hour 2 tablets daily    . pravastatin (PRAVACHOL) 40 MG tablet TAKE 1 TABLET ONCE DAILY FOR CHOLESTEROL. 90 tablet 1    . Probiotic Product (PROBIOTIC DAILY PO) Take 1 tablet by mouth daily.     Marland Kitchen senna-docusate (SENOKOT-S) 8.6-50 MG tablet Take 1 tablet by mouth at bedtime. (Patient taking differently: Take 1 tablet by mouth daily. )    . SIMBRINZA 1-0.2 % SUSP Place 1 drop into both eyes 3 (three) times daily.     No current facility-administered medications on file prior to visit.     Allergies  Allergen Reactions  . Ace Inhibitors Other (See Comments) and Cough    Chronic cough  . Atenolol     Atenolol eye drops  . Demerol Other (See Comments)    Hard to wake up  . Flomax [Tamsulosin Hcl] Other (See Comments)    Low BP  . Morphine And Related Other (See Comments)    hallucinations  . Oxytrol [Oxybutynin] Rash    Family History  Problem Relation Age of Onset  . Diabetes Mother   . Hypertension Mother   . Prostate cancer Father   . Heart disease Father   . Diabetes Brother   . Cancer Brother   . Heart disease Brother   . Diabetes Sister   . Heart disease Sister   . Hyperlipidemia Child   . Hypertension Child   . Diabetes Child   . Heart attack Child   . Cancer Child     BP 126/62   Pulse 65   Ht 5' 10.5" (1.791 m)   Wt 193 lb (87.5 kg)   SpO2 95%   BMI 27.30 kg/m    Review of Systems Denies LOC.      Objective:   Physical Exam VITAL SIGNS:  See vs page.  GENERAL: no distress.  Pulses: dorsalis pedis intact bilat.  CV: 1+ bilat leg edema.  Feet: no deformity. There is spotty hyperpigmentation on the legs and feet. There is bilateral onychomycosis.  Skin: feet are of normal color and temp, but the skin is dry and scaly. no ulcer on the feet.  Neuro: sensation is intact to touch on the feet.  A1c=8.3%    Assessment & Plan:  Type 2 DM, with retinopathy.  Renal insuff: this increases the risk of hypoglycemia.   Frail elderly state: he is not a candidate for aggressive glycemic control.   Patient is advised the following: Patient Instructions  check your blood  sugar 6 times a day--before the 3 meals, and at bedtime.  also check if you have symptoms of your blood sugar being too high or too low.  please keep a record of the readings and bring it to your next appointment here.  please call us sooner if you are having  low blood sugar episodes.   On this type of insulin schedule, you should eat meals on a regular schedule.  If a meal is missed or significantly delayed, your blood sugar could go low. To avoid the lows, carry some packets of cheese and crackers.  This way, you can avoid the lows.   Please continue the same insulin.   Please come back for a follow-up appointment in 4 months.

## 2016-07-06 ENCOUNTER — Ambulatory Visit: Payer: Medicare Other | Admitting: Endocrinology

## 2016-07-12 ENCOUNTER — Encounter: Payer: Self-pay | Admitting: Sports Medicine

## 2016-07-12 ENCOUNTER — Ambulatory Visit (INDEPENDENT_AMBULATORY_CARE_PROVIDER_SITE_OTHER): Payer: Medicare Other | Admitting: Sports Medicine

## 2016-07-12 DIAGNOSIS — M79671 Pain in right foot: Secondary | ICD-10-CM

## 2016-07-12 DIAGNOSIS — B351 Tinea unguium: Secondary | ICD-10-CM | POA: Diagnosis not present

## 2016-07-12 DIAGNOSIS — E119 Type 2 diabetes mellitus without complications: Secondary | ICD-10-CM | POA: Diagnosis not present

## 2016-07-12 DIAGNOSIS — M79672 Pain in left foot: Secondary | ICD-10-CM

## 2016-07-12 DIAGNOSIS — M79676 Pain in unspecified toe(s): Secondary | ICD-10-CM | POA: Diagnosis not present

## 2016-07-12 NOTE — Progress Notes (Signed)
Patient ID: Gregory Hatfield, male   DOB: November 29, 1925, 83 y.o.   MRN: [id number]  Subjective: Gregory Hatfield is a 83 y.o. male patient with history of type 2 diabetes who presents to office today complaining of long, painful nails  while ambulating in shoes; unable to trim. Patient states that the glucose reading this morning was 330. Patient denies any new changes in medication or new problems. Patient denies any new cramping, numbness, burning or tingling in the legs.  A1c is down to 8.3 per patient report.  Patient Active Problem List   Diagnosis Date Noted  . Hyperlipidemia 01/12/2016  . Prostate cancer ([place]) 10/12/2015  . Diabetic retinopathy ([place]) 10/12/2015  . Encounter for Medicare annual wellness exam 10/12/2015  . Open-angle glaucoma 07/06/2015  . Vitamin D deficiency 06/03/2015  . Medication management 06/03/2015  . CKD stage 3 due to type 2 diabetes mellitus ([place]) 06/03/2015  . Insulin-requiring or dependent type II diabetes mellitus ([place]) 05/05/2015  . Essential hypertension 05/05/2015  . SDAT (senile dementia of Alzheimer's type) 06/16/2013   Current Outpatient Prescriptions on File Prior to Visit  Medication Sig Dispense Refill  . acetaminophen (TYLENOL) 500 MG tablet Take 500 mg by mouth every 6 (six) hours as needed.    Marland Kitchen aspirin 81 MG tablet Take 81 mg by mouth daily.    . B-D UF III MINI PEN NEEDLES 31G X 5 MM MISC USE FOR INJECTIONS AS DIRECTED 100 each 1  . Blood Glucose Monitoring Suppl (ONE TOUCH ULTRA 2) w/Device KIT     . carvedilol (COREG) 12.5 MG tablet Take 12.5 mg by mouth 2 (two) times daily with a meal.    . Cholecalciferol (VITAMIN D3) 5000 UNITS CAPS Take 5,000 Units by mouth daily.     Marland Kitchen diltiazem (CARDIZEM CD) 240 MG 24 hr capsule Take 240 mg by mouth daily.    . furosemide (LASIX) 40 MG tablet TAKE 1 OR 2 TABLETS ONCE DAILY FOR BLOOD PRESSURE AND ANKLE SWELLING. 360 tablet 1  . hydrALAZINE (APRESOLINE) 25 MG tablet TAKE ONE TABLET 3 TIMES A DAY FOR  BLOOD PRESSURE. 270 tablet 1  . hyoscyamine (LEVSIN/SL) 0.125 MG SL tablet Place 1 tablet (0.125 mg total) under the tongue every 4 (four) hours as needed. 30 tablet 0  . Insulin Detemir (LEVEMIR FLEXTOUCH) 100 UNIT/ML Pen Inject 45 Units into the skin every morning. And pen needles 1/day 30 mL 11  . loratadine (CLARITIN) 10 MG tablet Take 10 mg by mouth daily.    Marland Kitchen LUMIGAN 0.01 % SOLN Place 1 drop into the right eye at bedtime.     . Multiple Vitamin (MULTIVITAMIN) tablet Take 1 tablet by mouth daily.      Marland Kitchen MYRBETRIQ 50 MG TB24 tablet Take 50 mg by mouth daily.     . ONE TOUCH ULTRA TEST test strip CHECK BLOOD SUGAR 6 TIMES A DAY. 180 each 11  . OVER THE COUNTER MEDICATION Mucinex DM 12 hour 2 tablets daily    . pravastatin (PRAVACHOL) 40 MG tablet TAKE 1 TABLET ONCE DAILY FOR CHOLESTEROL. 90 tablet 1  . Probiotic Product (PROBIOTIC DAILY PO) Take 1 tablet by mouth daily.     Marland Kitchen senna-docusate (SENOKOT-S) 8.6-50 MG tablet Take 1 tablet by mouth at bedtime. (Patient taking differently: Take 1 tablet by mouth daily. )    . SIMBRINZA 1-0.2 % SUSP Place 1 drop into both eyes 3 (three) times daily.     No current facility-administered medications on file prior  to visit.    Allergies  Allergen Reactions  . Ace Inhibitors Other (See Comments) and Cough    Chronic cough  . Atenolol     Atenolol eye drops  . Demerol Other (See Comments)    Hard to wake up  . Flomax [Tamsulosin Hcl] Other (See Comments)    Low BP  . Morphine And Related Other (See Comments)    hallucinations  . Oxytrol [Oxybutynin] Rash    Objective: General: Patient is awake, alert, and oriented x 3 and in no acute distress.  Integument: Skin is warm, dry and supple bilateral. Nails are tender, long, thickened and  dystrophic with subungual debris, consistent with onychomycosis, 1-5 bilateral. No signs of infection. No open lesions or preulcerative lesions present bilateral. Remaining integument  unremarkable.  Vasculature:  Dorsalis Pedis pulse 1/4 bilateral. Posterior Tibial pulse  0/4 bilateral. No ischemia or gangrene. Capillary fill time <3 sec 1-5 bilateral. Positive hair growth to the level of the digits. Temperature gradient within normal limits. No varicosities present bilateral. Trace edema present bilateral.   Neurology: The patient has intact sensation measured with a 5.07/10g Semmes Weinstein Monofilament at all pedal sites bilateral . Vibratory sensation diminished bilateral with tuning fork. No Babinski sign present bilateral.   Musculoskeletal: No symptomatic pedal deformities noted bilateral. Muscular strength 5/5 in all lower extremity muscular groups bilateral without pain or limitation on range of motion. No tenderness with calf compression bilateral.  Assessment and Plan: Problem List Items Addressed This Visit    None    Visit Diagnoses    Pain due to onychomycosis of toenail    -  Primary   Diabetes mellitus without complication (HCC)       Foot pain, bilateral         -Examined patient. -Discussed and educated patient on diabetic foot care, especially with  regards to the vascular, neurological and musculoskeletal systems.  -Stressed the importance of good glycemic control and the detriment of not  controlling glucose levels in relation to the foot. -Mechanically debrided all nails 1-5 bilateral using sterile nail nipper and filed with dremel without incident  -Answered all patient questions -Patient to return in 3 months for at risk foot care -Patient advised to call the office if any problems or questions arise in the meantime.  Landis Martins, DPM

## 2016-07-18 ENCOUNTER — Ambulatory Visit (INDEPENDENT_AMBULATORY_CARE_PROVIDER_SITE_OTHER): Payer: Medicare Other | Admitting: Internal Medicine

## 2016-07-18 VITALS — BP 148/70 | HR 64 | Temp 97.3°F | Resp 16 | Ht 70.5 in | Wt 218.8 lb

## 2016-07-18 DIAGNOSIS — E559 Vitamin D deficiency, unspecified: Secondary | ICD-10-CM

## 2016-07-18 DIAGNOSIS — E782 Mixed hyperlipidemia: Secondary | ICD-10-CM

## 2016-07-18 DIAGNOSIS — Z794 Long term (current) use of insulin: Secondary | ICD-10-CM

## 2016-07-18 DIAGNOSIS — Z79899 Other long term (current) drug therapy: Secondary | ICD-10-CM | POA: Diagnosis not present

## 2016-07-18 DIAGNOSIS — I10 Essential (primary) hypertension: Secondary | ICD-10-CM | POA: Diagnosis not present

## 2016-07-18 DIAGNOSIS — E119 Type 2 diabetes mellitus without complications: Secondary | ICD-10-CM

## 2016-07-18 DIAGNOSIS — N183 Chronic kidney disease, stage 3 (moderate): Secondary | ICD-10-CM

## 2016-07-18 DIAGNOSIS — E1122 Type 2 diabetes mellitus with diabetic chronic kidney disease: Secondary | ICD-10-CM

## 2016-07-18 LAB — CBC WITH DIFFERENTIAL/PLATELET
BASOS PCT: 1 %
Basophils Absolute: 64 cells/uL (ref 0–200)
EOS PCT: 2 %
Eosinophils Absolute: 128 cells/uL (ref 15–500)
HCT: 37.7 % — ABNORMAL LOW (ref 38.5–50.0)
Hemoglobin: 12.2 g/dL — ABNORMAL LOW (ref 13.2–17.1)
LYMPHS PCT: 37 %
Lymphs Abs: 2368 cells/uL (ref 850–3900)
MCH: 29 pg (ref 27.0–33.0)
MCHC: 32.4 g/dL (ref 32.0–36.0)
MCV: 89.5 fL (ref 80.0–100.0)
MONO ABS: 640 {cells}/uL (ref 200–950)
MONOS PCT: 10 %
MPV: 12.9 fL — ABNORMAL HIGH (ref 7.5–12.5)
Neutro Abs: 3200 cells/uL (ref 1500–7800)
Neutrophils Relative %: 50 %
PLATELETS: 240 10*3/uL (ref 140–400)
RBC: 4.21 MIL/uL (ref 4.20–5.80)
RDW: 13.5 % (ref 11.0–15.0)
WBC: 6.4 10*3/uL (ref 3.8–10.8)

## 2016-07-18 NOTE — Patient Instructions (Signed)

## 2016-07-18 NOTE — Progress Notes (Signed)
[facility] [name], M.D.        Uvaldo Bristle. Silverio Lay, P.A.-C       Starlyn Skeans, P.A.-C  [facility]                [address] [place], [place] [address] Telephone [phone] Telefax [phone] ______________________________________________________________________     This very nice 83 y.o. [facility] presents for 6 month follow up with Hypertension, Hyperlipidemia, Dementia, Insulin requiring T2_DM/CKD3, SDAT and Vitamin D Deficiency.      Patient is treated for HTN (1992) & BP has been controlled at home. Today's BP is sl elevated at 148/70. Patient has had no complaints of any cardiac type chest pain, palpitations, dyspnea/orthopnea/PND, dizziness, claudication, or dependent edema.     Hyperlipidemia is controlled with diet & meds. Patient denies myalgias or other med SE's. Last Lipids were at goal: Lab Results  Component Value Date   CHOL 126 07/18/2016   HDL 42 07/18/2016   LDLCALC 67 07/18/2016   TRIG 86 07/18/2016   CHOLHDL 3.0 07/18/2016      Also, the patient has history of T2_NIDDM PreDiabetes and has had no symptoms of reactive hypoglycemia, diabetic polys, paresthesias or visual blurring.  Last A1c was  Lab Results  Component Value Date   HGBA1C 9.3 (H) 07/18/2016      Further, the patient also has history of Vitamin D Deficiency and supplements vitamin D without any suspected side-effects. Last vitamin D was   Lab Results  Component Value Date   VD25OH 105 (H) 07/18/2016   Current Outpatient Prescriptions on File Prior to Visit  Medication Sig  . acetaminophen  500 MG tablet Take 500 mg  every 6  hours as needed.  Marland Kitchen aspirin 81 MG tablet Take  daily.  . Carvedilol 12.5 MG tablet Take  2  times daily with a meal.  . VITAMIN D 5000 UNITS S Take  daily.   Marland Kitchen diltiazem  CD 240 MG 24 hr  Take  daily.  . furosemide  40 MG  TAKE 1-2 TAB  DAILY   . hydrALAZINE 25 MG tablet TAKE  ONE TAB 3 TIMES A DAY   . LEVSIN/SL 0.125 MG SL 1 tab under tongue every 4  hours as needed.  Marland Kitchen LEVEMIR FLEX Pen Inject 45 Units into the skin every morning  . loratadine  10 MG tablet Take  daily.  Marland Kitchen LUMIGAN 0.01 % SOLN Place 1 drop into the rt eye at bedtime.   . Multiple Vitamin  Take 1 tab daily.    Marland Kitchen MYRBETRIQ 50 MG  Take  daily.   . pravastatin  40 MG tablet TAKE 1 TAB ONCE DAILY  . Probiotic  Take 1 tab daily.   [name] Take 1 tab at bedtime.   Marland Kitchen SIMBRINZA 1-0.2 % SUSP 1 drop into both eyes 3 times daily.   Allergies  Allergen Reactions  . Ace Inhibitors Other (See Comments) and Cough    Chronic cough  . Atenolol     Atenolol eye drops  . Demerol Other (See Comments)    Hard to wake up  . Flomax [Tamsulosin Hcl] Other (See Comments)    Low BP  . Morphine And Related Other (See Comments)    hallucinations  . Oxytrol [Oxybutynin] Rash   PMHx:   Past Medical History:  Diagnosis Date  . CKD  stage 4 due to type 2 diabetes mellitus ([place]) 06/03/2015  . Dyslipidemia associated with type 2 diabetes mellitus ([place]) 05/05/2015  . Essential hypertension 05/05/2015  . IDDM (insulin dependent diabetes mellitus) ([place])   . SDAT (senile dementia of Alzheimer's type) 06/16/2013  . Uncontrolled insulin-dependent diabetes mellitus with renal manifestation ([place]) 05/05/2015   Immunization History  Administered Date(s) Administered  . DTaP 09/28/2006  . Influenza Whole 04/05/2010, 04/04/2013  . Influenza, High Dose Seasonal PF 04/02/2014, 03/30/2015  . Influenza-Unspecified 02/19/2011  . Pneumococcal Conjugate-13 06/10/2014  . Pneumococcal Polysaccharide-23 07/21/2001, 03/20/2008  . Td 08/28/2008   Past Surgical History:  Procedure Laterality Date  . CARDIAC CATHETERIZATION  04/2004   normal r-sided pressure, mild pulm HTN  . CARPAL TUNNEL RELEASE Right   . CERVICAL SPINE SURGERY  1990's  . LUMBAR SPINE SURGERY  2003  . PROSTATE SURGERY  10/2010   transurethral resection   .  TOTAL HIP ARTHROPLASTY Left 01/17/2014   Procedure: LEFT TOTAL HIP ARTHROPLASTY;  Surgeon: Yvette Rack., MD;  Location: [facility];  Service: Orthopedics;  Laterality: Left;  . TRANSTHORACIC ECHOCARDIOGRAM  05/2006   EF 60-70%; mild calcif of MV; mild MV regurg; LA mildly dilated  . TRANSURETHRAL RESECTION OF PROSTATE N/A 08/22/2014   Procedure: TRANSURETHRAL RESECTION OF THE PROSTATE (TURP);  Surgeon: Marcia Brash, MD;  Location: [facility];  Service: Urology;  Laterality: N/A;   FHx:    Reviewed / unchanged  SHx:    Reviewed / unchanged  Systems Review:  Constitutional: Denies fever, chills, wt changes, headaches, insomnia, fatigue, night sweats, change in appetite. Eyes: Denies redness, blurred vision, diplopia, discharge, itchy, watery eyes.  ENT: Denies discharge, congestion, post nasal drip, epistaxis, sore throat, earache, hearing loss, dental pain, tinnitus, vertigo, sinus pain, snoring.  CV: Denies chest pain, palpitations, irregular heartbeat, syncope, dyspnea, diaphoresis, orthopnea, PND, claudication or edema. Respiratory: denies cough, dyspnea, DOE, pleurisy, hoarseness, laryngitis, wheezing.  Gastrointestinal: Denies dysphagia, odynophagia, heartburn, reflux, water brash, abdominal pain or cramps, nausea, vomiting, bloating, diarrhea, constipation, hematemesis, melena, hematochezia  or hemorrhoids. Genitourinary: Denies dysuria, frequency, urgency, nocturia, hesitancy, discharge, hematuria or flank pain. Musculoskeletal: Denies arthralgias, myalgias, stiffness, jt. swelling, pain, limping or strain/sprain.  Skin: Denies pruritus, rash, hives, warts, acne, eczema or change in skin lesion(s). Neuro: No weakness, tremor, incoordination, spasms, paresthesia or pain. Psychiatric: Denies confusion, memory loss or sensory loss. Endo: Denies change in weight, skin or hair change.  Heme/Lymph: No excessive bleeding, bruising or enlarged lymph nodes.  Physical Exam  BP (!) 148/70   Pulse  64   Temp 97.3 F (36.3 C)   Resp 16   Ht 5' 10.5" (1.791 m)   Wt 218 lb 12.8 oz (99.2 kg)   BMI 30.95 kg/m   Appears well nourished and in no distress.  Eyes: PERRLA, EOMs, conjunctiva no swelling or erythema. Sinuses: No frontal/maxillary tenderness ENT/Mouth: EAC's clear, TM's nl w/o erythema, bulging. Nares clear w/o erythema, swelling, exudates. Oropharynx clear without erythema or exudates. Oral hygiene is good. Tongue normal, non obstructing. Hearing intact.  Neck: Supple. Thyroid nl. Car 2+/2+ without bruits, nodes or JVD. Chest: Respirations nl with BS clear & equal w/o rales, rhonchi, wheezing or stridor.  Cor: Heart sounds normal w/ regular rate and rhythm without sig. murmurs, gallops, clicks, or rubs. Peripheral pulses normal and equal  without edema.  Abdomen: Soft & bowel sounds normal. Non-tender w/o guarding, rebound, hernias, masses, or organomegaly.  Lymphatics: Unremarkable.  Musculoskeletal: Full ROM all peripheral extremities,  joint stability, 5/5 strength, and normal gait.  Skin: Warm, dry without exposed rashes, lesions or ecchymosis apparent.  Neuro: Cranial nerves intact, reflexes equal bilaterally. Sensory-motor testing grossly intact. Tendon reflexes grossly intact.  Pysch: Alert & oriented x 3.  Insight and judgement nl & appropriate. No ideations.  Assessment and Plan:  1. Essential hypertension  - Continue medication, monitor blood pressure at home.  - Continue DASH diet. Reminder to go to the ER if any CP,  SOB, nausea, dizziness, severe HA, changes vision/speech,  left arm numbness and tingling and jaw pain.  - CBC with Differential/Platelet - BASIC METABOLIC PANEL WITH GFR - TSH  2. Mixed hyperlipidemia  - Continue diet/meds, exercise,& lifestyle modifications.  - Continue monitor periodic cholesterol/liver & renal functions   - Hepatic function panel - Lipid panel - TSH  3. CKD stage 3 due to type 2 diabetes mellitus ([place])  -  Continue diet, exercise, lifestyle modifications.  - Monitor appropriate labs.  - Hemoglobin A1c  4. Vitamin D deficiency  - Continue supplementation.  - VITAMIN D 25 Hydroxy   5. Insulin-requiring or dependent type II diabetes mellitus ([place])   6. Medication management  - CBC with Differential/Platelet - BASIC METABOLIC PANEL WITH GFR - Hepatic function panel - Magnesium - Lipid panel - TSH - Hemoglobin A1c - VITAMIN D 25 Hydroxy        Recommended regular exercise, BP monitoring, weight control, and discussed med and SE's. Recommended labs to assess and monitor clinical status. Further disposition pending results of labs. Over 30 minutes of exam, counseling, chart review was performed

## 2016-07-19 LAB — HEPATIC FUNCTION PANEL
ALBUMIN: 3.8 g/dL (ref 3.6–5.1)
ALK PHOS: 68 U/L (ref 40–115)
ALT: 19 U/L (ref 9–46)
AST: 23 U/L (ref 10–35)
BILIRUBIN INDIRECT: 0.3 mg/dL (ref 0.2–1.2)
BILIRUBIN TOTAL: 0.4 mg/dL (ref 0.2–1.2)
Bilirubin, Direct: 0.1 mg/dL (ref <=0.2)
Total Protein: 7 g/dL (ref 6.1–8.1)

## 2016-07-19 LAB — BASIC METABOLIC PANEL WITH GFR
BUN: 25 mg/dL (ref 7–25)
CALCIUM: 9.5 mg/dL (ref 8.6–10.3)
CO2: 28 mmol/L (ref 20–31)
Chloride: 103 mmol/L (ref 98–110)
Creat: 1.89 mg/dL — ABNORMAL HIGH (ref 0.70–1.11)
GFR, EST AFRICAN AMERICAN: 36 mL/min — AB (ref >=60)
GFR, EST NON AFRICAN AMERICAN: 31 mL/min — AB (ref >=60)
Glucose, Bld: 159 mg/dL — ABNORMAL HIGH (ref 65–99)
POTASSIUM: 3.7 mmol/L (ref 3.5–5.3)
SODIUM: 141 mmol/L (ref 135–146)

## 2016-07-19 LAB — VITAMIN D 25 HYDROXY (VIT D DEFICIENCY, FRACTURES): VIT D 25 HYDROXY: 105 ng/mL — AB (ref 30–100)

## 2016-07-19 LAB — MAGNESIUM: Magnesium: 2 mg/dL (ref 1.5–2.5)

## 2016-07-19 LAB — HEMOGLOBIN A1C
Hgb A1c MFr Bld: 9.3 % — ABNORMAL HIGH (ref <5.7)
MEAN PLASMA GLUCOSE: 220 mg/dL

## 2016-07-19 LAB — LIPID PANEL
Cholesterol: 126 mg/dL (ref <200)
HDL: 42 mg/dL (ref >40)
LDL Cholesterol: 67 mg/dL (ref <100)
TRIGLYCERIDES: 86 mg/dL (ref <150)
Total CHOL/HDL Ratio: 3 Ratio (ref <5.0)
VLDL: 17 mg/dL (ref <30)

## 2016-07-19 LAB — TSH: TSH: 1.84 mIU/L (ref 0.40–4.50)

## 2016-07-23 ENCOUNTER — Encounter: Payer: Self-pay | Admitting: Internal Medicine

## 2016-07-27 ENCOUNTER — Other Ambulatory Visit: Payer: Self-pay | Admitting: Internal Medicine

## 2016-07-29 ENCOUNTER — Other Ambulatory Visit: Payer: Self-pay | Admitting: Internal Medicine

## 2016-08-15 ENCOUNTER — Other Ambulatory Visit: Payer: Self-pay | Admitting: Endocrinology

## 2016-08-26 ENCOUNTER — Other Ambulatory Visit: Payer: Self-pay | Admitting: Physician Assistant

## 2016-09-26 ENCOUNTER — Ambulatory Visit (INDEPENDENT_AMBULATORY_CARE_PROVIDER_SITE_OTHER): Payer: Medicare Other | Admitting: Endocrinology

## 2016-09-26 ENCOUNTER — Encounter: Payer: Self-pay | Admitting: Endocrinology

## 2016-09-26 ENCOUNTER — Other Ambulatory Visit: Payer: Self-pay | Admitting: Internal Medicine

## 2016-09-26 VITALS — BP 132/80 | HR 64 | Ht 70.5 in | Wt 209.0 lb

## 2016-09-26 DIAGNOSIS — E119 Type 2 diabetes mellitus without complications: Secondary | ICD-10-CM | POA: Diagnosis not present

## 2016-09-26 DIAGNOSIS — Z794 Long term (current) use of insulin: Secondary | ICD-10-CM

## 2016-09-26 LAB — POCT GLYCOSYLATED HEMOGLOBIN (HGB A1C): Hemoglobin A1C: 9.1

## 2016-09-26 MED ORDER — INSULIN DETEMIR 100 UNIT/ML FLEXPEN: 50.0000 [IU] | PEN_INJECTOR | SUBCUTANEOUS | 11 refills | Status: DC

## 2016-09-26 NOTE — Progress Notes (Signed)
Subjective:    Patient ID: Gregory Hatfield, male    DOB: [birth year], 83 y.o.   MRN: [id number]  HPI Pt returns for f/u of diabetes mellitus: DM type: Insulin-requiring type 2 Dx'ed: 4656 Complications: renal insuff, retinopathy, and CHF.   Therapy: insulin since 2003 DKA: never Severe hypoglycemia: never.   Pancreatitis: never. Other: he is on a QD insulin regimen, due to noncompliance with multiple daily injections.   Interval history: He brings a record of his cbg's which I have reviewed today.  It varies from 93-300. There is no trend throughout the day, but it is lowest after he misses a meal.  pt states he feels well in general. Pt says he never misses the insulin.  Past Medical History:  Diagnosis Date  . CKD stage 4 due to type 2 diabetes mellitus ([place]) 06/03/2015  . Dyslipidemia associated with type 2 diabetes mellitus ([place]) 05/05/2015  . Essential hypertension 05/05/2015  . IDDM (insulin dependent diabetes mellitus) ([place])   . SDAT (senile dementia of Alzheimer's type) 06/16/2013  . Uncontrolled insulin-dependent diabetes mellitus with renal manifestation ([place]) 05/05/2015    Past Surgical History:  Procedure Laterality Date  . CARDIAC CATHETERIZATION  04/2004   normal r-sided pressure, mild pulm HTN  . CARPAL TUNNEL RELEASE Right   . CERVICAL SPINE SURGERY  1990's  . LUMBAR SPINE SURGERY  2003  . PROSTATE SURGERY  10/2010   transurethral resection   . TOTAL HIP ARTHROPLASTY Left 01/17/2014   Procedure: LEFT TOTAL HIP ARTHROPLASTY;  Surgeon: Yvette Rack., MD;  Location: [facility];  Service: Orthopedics;  Laterality: Left;  . TRANSTHORACIC ECHOCARDIOGRAM  05/2006   EF 60-70%; mild calcif of MV; mild MV regurg; LA mildly dilated  . TRANSURETHRAL RESECTION OF PROSTATE N/A 08/22/2014   Procedure: TRANSURETHRAL RESECTION OF THE PROSTATE (TURP);  Surgeon: Marcia Brash, MD;  [facility];  Service: Urology;  Laterality: N/A;    Social History   Social History  .  Marital status: Widowed    Spouse name: N/A  . Number of children: 5  . Years of education: master's   Occupational History  . teacher/instructor Retired   Social History Main Topics  . Smoking status: Former Smoker    Quit date: 06/21/1983  . Smokeless tobacco: Never Used  . Alcohol use No  . Drug use: No  . Sexual activity: No   Other Topics Concern  . Not on file   Social History Narrative  . No narrative on file    Current Outpatient Prescriptions on File Prior to Visit  Medication Sig Dispense Refill  . acetaminophen (TYLENOL) 500 MG tablet Take 500 mg by mouth every 6 (six) hours as needed.    Marland Kitchen aspirin 81 MG tablet Take 81 mg by mouth daily.    . B-D UF III MINI PEN NEEDLES 31G X 5 MM MISC USE FOR INJECTIONS AS DIRECTED 100 each 0  . Blood Glucose Monitoring Suppl (ONE TOUCH ULTRA 2) w/Device KIT     . Cholecalciferol (VITAMIN D3) 5000 UNITS CAPS Take 5,000 Units by mouth daily.     Marland Kitchen diltiazem (CARDIZEM CD) 240 MG 24 hr capsule TAKE (1) CAPSULE DAILY FOR HIGH BLOOD PRESSURE. 90 capsule 1  . furosemide (LASIX) 40 MG tablet TAKE 1 OR 2 TABLETS ONCE DAILY FOR BLOOD PRESSURE AND ANKLE SWELLING. 360 tablet 1  . hydrALAZINE (APRESOLINE) 25 MG tablet TAKE ONE TABLET 3 TIMES A DAY FOR BLOOD PRESSURE. 270 tablet 1  .  hyoscyamine (LEVSIN/SL) 0.125 MG SL tablet Place 1 tablet (0.125 mg total) under the tongue every 4 (four) hours as needed. 30 tablet 0  . loratadine (CLARITIN) 10 MG tablet Take 10 mg by mouth daily.    Marland Kitchen LUMIGAN 0.01 % SOLN Place 1 drop into the right eye at bedtime.     . Multiple Vitamin (MULTIVITAMIN) tablet Take 1 tablet by mouth daily.      Marland Kitchen MYRBETRIQ 50 MG TB24 tablet Take 50 mg by mouth daily.     . ONE TOUCH ULTRA TEST test strip CHECK BLOOD SUGAR 6 TIMES A DAY. 180 each 11  . OVER THE COUNTER MEDICATION Mucinex DM 12 hour 2 tablets daily    . Probiotic Product (PROBIOTIC DAILY PO) Take 1 tablet by mouth daily.     Marland Kitchen senna-docusate (SENOKOT-S) 8.6-50 MG  tablet Take 1 tablet by mouth at bedtime. (Patient taking differently: Take 1 tablet by mouth daily. )    . SIMBRINZA 1-0.2 % SUSP Place 1 drop into both eyes 3 (three) times daily.     No current facility-administered medications on file prior to visit.     Allergies  Allergen Reactions  . Ace Inhibitors Other (See Comments) and Cough    Chronic cough  . Atenolol     Atenolol eye drops  . Demerol Other (See Comments)    Hard to wake up  . Flomax [Tamsulosin Hcl] Other (See Comments)    Low BP  . Morphine And Related Other (See Comments)    hallucinations  . Oxytrol [Oxybutynin] Rash    Family History  Problem Relation Age of Onset  . Diabetes Mother   . Hypertension Mother   . Prostate cancer Father   . Heart disease Father   . Diabetes Brother   . Cancer Brother   . Heart disease Brother   . Diabetes Sister   . Heart disease Sister   . Hyperlipidemia Child   . Hypertension Child   . Diabetes Child   . Heart attack Child   . Cancer Child     BP 132/80   Pulse 64   Ht 5' 10.5" (1.791 m)   Wt 209 lb (94.8 kg)   SpO2 95%   BMI 29.56 kg/m    Review of Systems He denies hypoglycemia.      Objective:   Physical Exam VITAL SIGNS:  See vs page.  GENERAL: no distress.  Pulses: dorsalis pedis intact bilat.   CV: 1+ bilat leg edema.  Feet: no deformity. There is spotty hyperpigmentation on the legs and feet. There is bilateral onychomycosis.  Skin: feet are of normal temp. no ulcer on the feet.  Neuro: sensation is intact to touch on the feet.   a1c=9.1%    Assessment & Plan:  Insulin-requiring type 2 DM, with retinopathy: worse Frail elderly state: he is not a candidate for aggressive glycemic control. Renal insuff: in this setting, he is at risk for hypoglycemia.  Patient Instructions  check your blood sugar 6 times a day--before the 3 meals, and at bedtime.  also check if you have symptoms of your blood sugar being too high or too low.  please keep a  record of the readings and bring it to your next appointment here.  please call us sooner if you are having low blood sugar episodes.   On this type of insulin schedule, you should eat meals on a regular schedule.  If a meal is missed or significantly delayed, your blood  sugar could go low.  Please increase the levemir to 50 units each morning.  Please come back for a follow-up appointment in 3 months.

## 2016-09-26 NOTE — Patient Instructions (Addendum)
check your blood sugar 6 times a day--before the 3 meals, and at bedtime.  also check if you have symptoms of your blood sugar being too high or too low.  please keep a record of the readings and bring it to your next appointment here.  please call us sooner if you are having low blood sugar episodes.   On this type of insulin schedule, you should eat meals on a regular schedule.  If a meal is missed or significantly delayed, your blood sugar could go low.  Please increase the levemir to 50 units each morning.  Please come back for a follow-up appointment in 3 months.

## 2016-09-27 ENCOUNTER — Ambulatory Visit (INDEPENDENT_AMBULATORY_CARE_PROVIDER_SITE_OTHER): Payer: Medicare Other | Admitting: Internal Medicine

## 2016-09-27 ENCOUNTER — Encounter: Payer: Self-pay | Admitting: Internal Medicine

## 2016-09-27 VITALS — BP 138/60 | HR 62 | Temp 98.2°F | Resp 16 | Ht 70.5 in | Wt 212.0 lb

## 2016-09-27 DIAGNOSIS — Z794 Long term (current) use of insulin: Secondary | ICD-10-CM | POA: Diagnosis not present

## 2016-09-27 DIAGNOSIS — R809 Proteinuria, unspecified: Secondary | ICD-10-CM

## 2016-09-27 DIAGNOSIS — I10 Essential (primary) hypertension: Secondary | ICD-10-CM | POA: Diagnosis not present

## 2016-09-27 DIAGNOSIS — E119 Type 2 diabetes mellitus without complications: Secondary | ICD-10-CM | POA: Diagnosis not present

## 2016-09-27 NOTE — Progress Notes (Signed)
Assessment and Plan:    1. Essential hypertension -BP readings are somewhere around 160-175/70-80 -will need to adjust medication but as he is currently seeing multiple doctors want to confirm medication routine before making changes.  May need to increase hydralazine vs. Potentially using a clonidine patch  2. Proteinuria, unspecified type -not a candidate for ARB/ACE given kidney function -likely worsening given uncontrolled BP and uncontrolled CBGs - Microalbumin / creatinine urine ratio  3. Insulin-requiring or dependent type II diabetes mellitus ([place]) -managed by Dr. Loanne Drilling -patient had his levemir increased to 50 units yesterday    HPI 83 y.o.male presents for evaluation of proteinuria.  He was referred back to [place] from the [place] for the proteinuria.  He is not currently on ARB due to renal function.  He had been on an ACE inhibitor in the past and had a dry cough with it.  Last GFR was 30%.  He is followed at [facility] by Dr. Mercy Moore.  He was last seen by them in October 2017.  He does see Dr. Loanne Drilling and he went there yesterday.  He reports that Dr. Loanne Drilling made some adjustments of his insulin.  He reports that the [place] suggested he get in touch with Dr. Mercy Moore. He could not get in touch with Dr. Mercy Moore.  His daughter has some concerns that he is not necessarily taking medications correctly.  Somebody does lay out pill boxes for him.  His balance is poor.  He is using a cane but does not use a walker.  His memory is poor.    Past Medical History:  Diagnosis Date  . CKD stage 4 due to type 2 diabetes mellitus ([place]) 06/03/2015  . Dyslipidemia associated with type 2 diabetes mellitus ([place]) 05/05/2015  . Essential hypertension 05/05/2015  . IDDM (insulin dependent diabetes mellitus) ([place])   . SDAT (senile dementia of Alzheimer's type) 06/16/2013  . Uncontrolled insulin-dependent diabetes mellitus with renal manifestation ([place]) 05/05/2015     Allergies  Allergen Reactions   . Ace Inhibitors Other (See Comments) and Cough    Chronic cough  . Atenolol     Atenolol eye drops  . Demerol Other (See Comments)    Hard to wake up  . Flomax [Tamsulosin Hcl] Other (See Comments)    Low BP  . Morphine And Related Other (See Comments)    hallucinations  . Oxytrol [Oxybutynin] Rash      Current Outpatient Prescriptions on File Prior to Visit  Medication Sig Dispense Refill  . acetaminophen (TYLENOL) 500 MG tablet Take 500 mg by mouth every 6 (six) hours as needed.    Marland Kitchen aspirin 81 MG tablet Take 81 mg by mouth daily.    . B-D UF III MINI PEN NEEDLES 31G X 5 MM MISC USE FOR INJECTIONS AS DIRECTED 100 each 0  . Blood Glucose Monitoring Suppl (ONE TOUCH ULTRA 2) w/Device KIT     . carvedilol (COREG) 12.5 MG tablet TAKE  (1)  TABLET TWICE A DAY FOR HIGH BLOOD PRESSURE. 60 tablet 0  . Cholecalciferol (VITAMIN D3) 5000 UNITS CAPS Take 5,000 Units by mouth daily.     Marland Kitchen diltiazem (CARDIZEM CD) 240 MG 24 hr capsule TAKE (1) CAPSULE DAILY FOR HIGH BLOOD PRESSURE. 90 capsule 1  . furosemide (LASIX) 40 MG tablet TAKE 1 OR 2 TABLETS ONCE DAILY FOR BLOOD PRESSURE AND ANKLE SWELLING. 360 tablet 1  . hydrALAZINE (APRESOLINE) 25 MG tablet TAKE ONE TABLET 3 TIMES A DAY FOR BLOOD PRESSURE. 270 tablet  1  . hyoscyamine (LEVSIN/SL) 0.125 MG SL tablet Place 1 tablet (0.125 mg total) under the tongue every 4 (four) hours as needed. 30 tablet 0  . Insulin Detemir (LEVEMIR FLEXTOUCH) 100 UNIT/ML Pen Inject 50 Units into the skin every morning. And pen needles 1/day 30 mL 11  . loratadine (CLARITIN) 10 MG tablet Take 10 mg by mouth daily.    Marland Kitchen LUMIGAN 0.01 % SOLN Place 1 drop into the right eye at bedtime.     . Multiple Vitamin (MULTIVITAMIN) tablet Take 1 tablet by mouth daily.      Marland Kitchen MYRBETRIQ 50 MG TB24 tablet Take 50 mg by mouth daily.     . ONE TOUCH ULTRA TEST test strip CHECK BLOOD SUGAR 6 TIMES A DAY. 180 each 11  . OVER THE COUNTER MEDICATION Mucinex DM 12 hour 2 tablets daily    .  pravastatin (PRAVACHOL) 40 MG tablet TAKE 1 TABLET ONCE DAILY FOR CHOLESTEROL. 30 tablet 0  . Probiotic Product (PROBIOTIC DAILY PO) Take 1 tablet by mouth daily.     Marland Kitchen senna-docusate (SENOKOT-S) 8.6-50 MG tablet Take 1 tablet by mouth at bedtime. (Patient taking differently: Take 1 tablet by mouth daily. )    . SIMBRINZA 1-0.2 % SUSP Place 1 drop into both eyes 3 (three) times daily.     No current facility-administered medications on file prior to visit.     Review of Systems  Constitutional: Negative for chills, fever and malaise/fatigue.  Respiratory: Negative for cough, shortness of breath and wheezing.   Cardiovascular: Negative for chest pain, palpitations and leg swelling.  Gastrointestinal: Negative for abdominal pain, blood in stool, constipation, diarrhea, heartburn and melena.  Genitourinary: Negative.   Musculoskeletal: Negative.   Skin: Negative.   Neurological: Negative for dizziness, sensory change and loss of consciousness.  Psychiatric/Behavioral: Negative for depression. The patient is not nervous/anxious and does not have insomnia.      Physical Exam: Filed Weights   09/27/16 1031  Weight: 212 lb (96.2 kg)   BP 138/60   Pulse 62   Temp 98.2 F (36.8 C) (Temporal)   Resp 16   Ht 5' 10.5" (1.791 m)   Wt 212 lb (96.2 kg)   BMI 29.99 kg/m  General Appearance: Well developed well nourished, non-toxic appearing in no apparent distress. Eyes: PERRLA, EOMs, conjunctiva w/ no swelling or erythema or discharge Sinuses: No Frontal/maxillary tenderness ENT/Mouth: Ear canals clear without swelling or erythema.  TM's normal bilaterally with no retractions, bulging, or loss of landmarks.   Neck: Supple, thyroid normal, no notable JVD  Respiratory: Respiratory effort normal, Clear breath sounds anteriorly and posteriorly bilaterally without rales, rhonchi, wheezing or stridor. No retractions or accessory muscle usage. Cardio: RRR with no MRGs.   Abdomen: Soft, + BS.  Non  tender, no guarding, rebound, hernias, masses.  Musculoskeletal: Full ROM, 5/5 strength, normal gait.  Skin: Warm, dry without rashes  Neuro: Awake and oriented X 2, Cranial nerves intact. Normal muscle tone, balance precarious with the use of cane.  Does have to hold on to a wall while using cane.   Psych: normal affect, Poor insight and judgement, rambles and cannot keep timeline straight in conversation.     Starlyn Skeans, PA-C 10:42 AM [facility]

## 2016-09-28 LAB — MICROALBUMIN / CREATININE URINE RATIO
CREATININE, URINE: 99 mg/dL (ref 20–370)
MICROALB UR: 25 mg/dL
MICROALB/CREAT RATIO: 253 ug/mg{creat} — AB (ref <30)

## 2016-10-06 ENCOUNTER — Emergency Department (HOSPITAL_COMMUNITY): Payer: Medicare Other

## 2016-10-06 ENCOUNTER — Encounter (HOSPITAL_COMMUNITY): Payer: Self-pay | Admitting: Emergency Medicine

## 2016-10-06 ENCOUNTER — Observation Stay (HOSPITAL_COMMUNITY)
Admission: EM | Admit: 2016-10-06 | Discharge: 2016-10-07 | Disposition: A | Discharge status: Home / Self Care | Payer: Medicare Other | Attending: Internal Medicine | Admitting: Internal Medicine

## 2016-10-06 DIAGNOSIS — Z8546 Personal history of malignant neoplasm of prostate: Secondary | ICD-10-CM | POA: Insufficient documentation

## 2016-10-06 DIAGNOSIS — G301 Alzheimer's disease with late onset: Secondary | ICD-10-CM

## 2016-10-06 DIAGNOSIS — Z79899 Other long term (current) drug therapy: Secondary | ICD-10-CM

## 2016-10-06 DIAGNOSIS — Z96641 Presence of right artificial hip joint: Secondary | ICD-10-CM | POA: Insufficient documentation

## 2016-10-06 DIAGNOSIS — E11319 Type 2 diabetes mellitus with unspecified diabetic retinopathy without macular edema: Secondary | ICD-10-CM | POA: Diagnosis not present

## 2016-10-06 DIAGNOSIS — I5032 Chronic diastolic (congestive) heart failure: Secondary | ICD-10-CM | POA: Diagnosis not present

## 2016-10-06 DIAGNOSIS — F0280 Dementia in other diseases classified elsewhere without behavioral disturbance: Secondary | ICD-10-CM | POA: Diagnosis present

## 2016-10-06 DIAGNOSIS — R531 Weakness: Secondary | ICD-10-CM

## 2016-10-06 DIAGNOSIS — Z87891 Personal history of nicotine dependence: Secondary | ICD-10-CM | POA: Insufficient documentation

## 2016-10-06 DIAGNOSIS — Z7982 Long term (current) use of aspirin: Secondary | ICD-10-CM | POA: Insufficient documentation

## 2016-10-06 DIAGNOSIS — E118 Type 2 diabetes mellitus with unspecified complications: Secondary | ICD-10-CM

## 2016-10-06 DIAGNOSIS — I10 Essential (primary) hypertension: Secondary | ICD-10-CM | POA: Diagnosis not present

## 2016-10-06 DIAGNOSIS — N183 Chronic kidney disease, stage 3 (moderate): Secondary | ICD-10-CM

## 2016-10-06 DIAGNOSIS — R001 Bradycardia, unspecified: Principal | ICD-10-CM

## 2016-10-06 DIAGNOSIS — E1122 Type 2 diabetes mellitus with diabetic chronic kidney disease: Secondary | ICD-10-CM | POA: Diagnosis not present

## 2016-10-06 DIAGNOSIS — I130 Hypertensive heart and chronic kidney disease with heart failure and stage 1 through stage 4 chronic kidney disease, or unspecified chronic kidney disease: Secondary | ICD-10-CM | POA: Diagnosis not present

## 2016-10-06 DIAGNOSIS — N184 Chronic kidney disease, stage 4 (severe): Secondary | ICD-10-CM | POA: Insufficient documentation

## 2016-10-06 DIAGNOSIS — E785 Hyperlipidemia, unspecified: Secondary | ICD-10-CM | POA: Diagnosis present

## 2016-10-06 DIAGNOSIS — N179 Acute kidney failure, unspecified: Secondary | ICD-10-CM | POA: Diagnosis not present

## 2016-10-06 DIAGNOSIS — I5033 Acute on chronic diastolic (congestive) heart failure: Secondary | ICD-10-CM

## 2016-10-06 HISTORY — DX: Bradycardia, unspecified: R00.1

## 2016-10-06 LAB — URINALYSIS, ROUTINE W REFLEX MICROSCOPIC
Bilirubin Urine: NEGATIVE
GLUCOSE, UA: 150 mg/dL — AB
Hgb urine dipstick: NEGATIVE
Ketones, ur: NEGATIVE mg/dL
LEUKOCYTES UA: NEGATIVE
Nitrite: NEGATIVE
PH: 5 (ref 5.0–8.0)
Protein, ur: NEGATIVE mg/dL
SPECIFIC GRAVITY, URINE: 1.011 (ref 1.005–1.030)

## 2016-10-06 LAB — COMPREHENSIVE METABOLIC PANEL
ALK PHOS: 67 U/L (ref 38–126)
ALT: 23 U/L (ref 17–63)
ANION GAP: 10 (ref 5–15)
AST: 28 U/L (ref 15–41)
Albumin: 3.8 g/dL (ref 3.5–5.0)
BILIRUBIN TOTAL: 0.2 mg/dL — AB (ref 0.3–1.2)
BUN: 40 mg/dL — AB (ref 6–20)
CALCIUM: 9 mg/dL (ref 8.9–10.3)
CO2: 23 mmol/L (ref 22–32)
CREATININE: 2.45 mg/dL — AB (ref 0.61–1.24)
Chloride: 100 mmol/L — ABNORMAL LOW (ref 101–111)
GFR calc non Af Amer: 22 mL/min — ABNORMAL LOW (ref >60)
GFR, EST AFRICAN AMERICAN: 25 mL/min — AB (ref >60)
GLUCOSE: 343 mg/dL — AB (ref 65–99)
Potassium: 4.2 mmol/L (ref 3.5–5.1)
SODIUM: 133 mmol/L — AB (ref 135–145)
Total Protein: 6.9 g/dL (ref 6.5–8.1)

## 2016-10-06 LAB — CBC
HCT: 35.7 % — ABNORMAL LOW (ref 39.0–52.0)
HEMOGLOBIN: 11.6 g/dL — AB (ref 13.0–17.0)
MCH: 29 pg (ref 26.0–34.0)
MCHC: 32.5 g/dL (ref 30.0–36.0)
MCV: 89.3 fL (ref 78.0–100.0)
Platelets: 180 10*3/uL (ref 150–400)
RBC: 4 MIL/uL — ABNORMAL LOW (ref 4.22–5.81)
RDW: 13.6 % (ref 11.5–15.5)
WBC: 8.1 10*3/uL (ref 4.0–10.5)

## 2016-10-06 LAB — GLUCOSE, CAPILLARY
GLUCOSE-CAPILLARY: 179 mg/dL — AB (ref 65–99)
Glucose-Capillary: 269 mg/dL — ABNORMAL HIGH (ref 65–99)

## 2016-10-06 LAB — PHOSPHORUS: Phosphorus: 3.9 mg/dL (ref 2.5–4.6)

## 2016-10-06 LAB — TROPONIN I
Troponin I: <0.03 ng/mL (ref <0.03)
Troponin I: <0.03 ng/mL (ref <0.03)
Troponin I: <0.03 ng/mL (ref <0.03)

## 2016-10-06 LAB — MAGNESIUM: Magnesium: 2 mg/dL (ref 1.7–2.4)

## 2016-10-06 LAB — TSH: TSH: 2.471 u[IU]/mL (ref 0.350–4.500)

## 2016-10-06 MED ORDER — PRAVASTATIN SODIUM 40 MG PO TABS
40.0000 mg | ORAL_TABLET | Freq: Every day | ORAL | Status: DC
  Administered 2016-10-06: 40 mg via ORAL
  Filled 2016-10-06: qty 1

## 2016-10-06 MED ORDER — LATANOPROST 0.005 % OP SOLN
1.0000 [drp] | Freq: Every day | OPHTHALMIC | Status: DC
  Administered 2016-10-06: 1 [drp] via OPHTHALMIC
  Filled 2016-10-06: qty 2.5

## 2016-10-06 MED ORDER — ACETAMINOPHEN 650 MG RE SUPP: 650.0000 mg | Freq: Four times a day (QID) | RECTAL | Status: DC | PRN

## 2016-10-06 MED ORDER — ONDANSETRON HCL 4 MG/2ML IJ SOLN: 4.0000 mg | Freq: Four times a day (QID) | INTRAMUSCULAR | Status: DC | PRN

## 2016-10-06 MED ORDER — ONDANSETRON HCL 4 MG PO TABS: 4.0000 mg | ORAL_TABLET | Freq: Four times a day (QID) | ORAL | Status: DC | PRN

## 2016-10-06 MED ORDER — HEPARIN SODIUM (PORCINE) 5000 UNIT/ML IJ SOLN
5000.0000 [IU] | Freq: Three times a day (TID) | INTRAMUSCULAR | Status: DC
  Administered 2016-10-06 – 2016-10-07 (×4): 5000 [IU] via SUBCUTANEOUS
  Filled 2016-10-06 (×4): qty 1

## 2016-10-06 MED ORDER — INSULIN ASPART 100 UNIT/ML ~~LOC~~ SOLN: 0.0000 [IU] | Freq: Every day | SUBCUTANEOUS | Status: DC

## 2016-10-06 MED ORDER — SODIUM CHLORIDE 0.9 % IV BOLUS (SEPSIS)
1000.0000 mL | Freq: Once | INTRAVENOUS | Status: AC
  Administered 2016-10-06: 1000 mL via INTRAVENOUS

## 2016-10-06 MED ORDER — INSULIN DETEMIR 100 UNIT/ML ~~LOC~~ SOLN
50.0000 [IU] | Freq: Every day | SUBCUTANEOUS | Status: DC
  Administered 2016-10-07: 50 [IU] via SUBCUTANEOUS
  Filled 2016-10-06: qty 0.5

## 2016-10-06 MED ORDER — ACETAMINOPHEN 325 MG PO TABS: 650.0000 mg | ORAL_TABLET | Freq: Four times a day (QID) | ORAL | Status: DC | PRN

## 2016-10-06 MED ORDER — MIRABEGRON ER 25 MG PO TB24
50.0000 mg | ORAL_TABLET | Freq: Every day | ORAL | Status: DC
Start: 2016-10-06 — End: 2016-10-07
  Administered 2016-10-06 – 2016-10-07 (×2): 50 mg via ORAL
  Filled 2016-10-06 (×2): qty 2

## 2016-10-06 MED ORDER — ASPIRIN EC 81 MG PO TBEC
81.0000 mg | DELAYED_RELEASE_TABLET | Freq: Every day | ORAL | Status: DC
  Administered 2016-10-06 – 2016-10-07 (×2): 81 mg via ORAL
  Filled 2016-10-06 (×2): qty 1

## 2016-10-06 MED ORDER — SODIUM CHLORIDE 0.9 % IV SOLN
INTRAVENOUS | Status: DC
  Administered 2016-10-06: 17:00:00 via INTRAVENOUS
  Administered 2016-10-07: 75 mL/h via INTRAVENOUS

## 2016-10-06 MED ORDER — ASPIRIN 81 MG PO TABS: 81.0000 mg | ORAL_TABLET | Freq: Every day | ORAL | Status: DC

## 2016-10-06 MED ORDER — HYDRALAZINE HCL 25 MG PO TABS
25.0000 mg | ORAL_TABLET | Freq: Three times a day (TID) | ORAL | Status: DC
  Administered 2016-10-06 – 2016-10-07 (×4): 25 mg via ORAL
  Filled 2016-10-06 (×4): qty 1

## 2016-10-06 MED ORDER — SODIUM CHLORIDE 0.9 % IV SOLN
1.0000 g | Freq: Once | INTRAVENOUS | Status: AC
  Administered 2016-10-06: 1 g via INTRAVENOUS
  Filled 2016-10-06: qty 10

## 2016-10-06 MED ORDER — INSULIN ASPART 100 UNIT/ML ~~LOC~~ SOLN
0.0000 [IU] | Freq: Three times a day (TID) | SUBCUTANEOUS | Status: DC
  Administered 2016-10-06: 5 [IU] via SUBCUTANEOUS
  Administered 2016-10-07 (×2): 2 [IU] via SUBCUTANEOUS

## 2016-10-06 MED ORDER — FUROSEMIDE 40 MG PO TABS: 40.0000 mg | ORAL_TABLET | Freq: Two times a day (BID) | ORAL | Status: DC

## 2016-10-06 MED ORDER — INSULIN DETEMIR 100 UNIT/ML FLEXPEN: 50.0000 [IU] | PEN_INJECTOR | SUBCUTANEOUS | Status: DC

## 2016-10-06 NOTE — Progress Notes (Signed)
ANTICOAGULATION CONSULT NOTE - Initial Consult  Pharmacy Consult for Heparin Indication: VTE prophylaxis  Allergies  Allergen Reactions  . Ace Inhibitors Other (See Comments) and Cough    Chronic cough  . Demerol Other (See Comments)    Hard to wake up  . Cosopt [Dorzolamide Hcl-Timolol Mal] Other (See Comments)    Cause heart rate to drop  . Flomax [Tamsulosin Hcl] Other (See Comments)    Low BP  . Morphine And Related Other (See Comments)    hallucinations  . Oxytrol [Oxybutynin] Rash    Patient Measurements: Height: 5\' 10"  (177.8 cm) Weight: 212 lb (96.2 kg) IBW/kg (Calculated) : 73  Vital Signs: Temp: 98.1 F (36.7 C) (04/19 1439) Temp Source: Oral (04/19 1439) BP: 143/61 (04/19 1439) Pulse Rate: 57 (04/19 1400)  Labs:  Recent Labs  10/06/16 1025 10/06/16 1149  HGB 11.6*  --   HCT 35.7*  --   PLT 180  --   CREATININE 2.45*  --   TROPONINI  --  <0.03    Estimated Creatinine Clearance: 24.3 mL/min (A) (by C-G formula based on SCr of 2.45 mg/dL (H)).   Medical History: Past Medical History:  Diagnosis Date  . CKD stage 4 due to type 2 diabetes mellitus ([place]) 06/03/2015  . Dyslipidemia associated with type 2 diabetes mellitus ([place]) 05/05/2015  . Essential hypertension 05/05/2015  . IDDM (insulin dependent diabetes mellitus) ([place])   . SDAT (senile dementia of Alzheimer's type) 06/16/2013  . Uncontrolled insulin-dependent diabetes mellitus with renal manifestation ([place]) 05/05/2015    Medications:  Scheduled:  . hydrALAZINE  25 mg Oral Q8H  . insulin aspart  0-5 Units Subcutaneous QHS  . insulin aspart  0-9 Units Subcutaneous TID WC  . latanoprost  1 drop Right Eye QHS  . mirabegron ER  50 mg Oral Daily  . pravastatin  40 mg Oral q1800    Assessment: 83yo male admitted with fatigue & weakness.  Cr is up from baseline 1.9.    Plan:  Heparin 5000 units SQ q8 Pharmacy will sign off, please reconsult as needed  Gracy Bruins, PharmD Clinical  Pharmacist [facility]

## 2016-10-06 NOTE — H&P (Addendum)
History and Physical    Gregory Hatfield QQV:[id number] DOB: Nov 28, 1925 DOA: 10/06/2016  [facility]  [facility] office via EMS  Chief Complaint: fatigue and generalized weakness.  HPI: Gregory Hatfield is a 83 y.o. male with medical history significant of CKD4 diabetes, early dementia, hypertension. Patient presenting with a four-hour history of gradual onset fatigue and generalized weakness. No other associated symptoms. Symptoms were constant and progressive until time of presentation to the ED. Patient endorses going to his optometry appointment on day of admission for routine exam. While there a request was made to check his vital signs which were noted to be abnormal. Patient with normal blood pressures but extremely bradycardic. Patient endorses associated symptoms of intermittent dizziness which is worse with ambulation but denies fevers, chest pain, palpitations, nausea, vomiting, diaphoresis, dysuria, frequency, neck stiffness, headache, focal neurological deficit. Patient states he does have loose stools which he passes twice daily but states that this is fairly typical for him. Patient does state that he recently has started changing when he takes some of his medicines. Of note patient does have an in-home healthcare aide who helps patient with his ADLs but states that patient takes his own medicines. Level V caveat applies as patient is somewhat confused and has dementia.   ED Course: Patient was noted to have a junctional rhythm with bradycardic pace of 30 bpm. Patient had pacers placed and was given calcium gluconate with return of sinus bradycardic rhythm and increase of heart rate into the 50s. Patient feeling better. Patient also given a 1 L normal saline bolus. Other objective findings outlined below. Patient was never hypotensive  Review of Systems: As per HPI otherwise 10 point review of systems negative.   Ambulatory Status: No restrictions  Past Medical  History:  Diagnosis Date  . CKD stage 4 due to type 2 diabetes mellitus ([place]) 06/03/2015  . Dyslipidemia associated with type 2 diabetes mellitus ([place]) 05/05/2015  . Essential hypertension 05/05/2015  . IDDM (insulin dependent diabetes mellitus) ([place])   . SDAT (senile dementia of Alzheimer's type) 06/16/2013  . Uncontrolled insulin-dependent diabetes mellitus with renal manifestation ([place]) 05/05/2015    Past Surgical History:  Procedure Laterality Date  . CARDIAC CATHETERIZATION  04/2004   normal r-sided pressure, mild pulm HTN  . CARPAL TUNNEL RELEASE Right   . CERVICAL SPINE SURGERY  1990's  . LUMBAR SPINE SURGERY  2003  . PROSTATE SURGERY  10/2010   transurethral resection   . TOTAL HIP ARTHROPLASTY Left 01/17/2014   Procedure: LEFT TOTAL HIP ARTHROPLASTY;  Surgeon: Yvette Rack., MD;  Location: [facility];  Service: Orthopedics;  Laterality: Left;  . TRANSTHORACIC ECHOCARDIOGRAM  05/2006   EF 60-70%; mild calcif of MV; mild MV regurg; LA mildly dilated  . TRANSURETHRAL RESECTION OF PROSTATE N/A 08/22/2014   Procedure: TRANSURETHRAL RESECTION OF THE PROSTATE (TURP);  Surgeon: Marcia Brash, MD;  Location: [facility];  Service: Urology;  Laterality: N/A;    Social History   Social History  . Marital status: Widowed    Spouse name: N/A  . Number of children: 5  . Years of education: master's   Occupational History  . teacher/instructor Retired   Social History Main Topics  . Smoking status: Former Smoker    Quit date: 06/21/1983  . Smokeless tobacco: Never Used  . Alcohol use No  . Drug use: No  . Sexual activity: No   Other Topics Concern  . Not on file   Social  History Narrative  . No narrative on file    Allergies  Allergen Reactions  . Ace Inhibitors Other (See Comments) and Cough    Chronic cough  . Demerol Other (See Comments)    Hard to wake up  . Cosopt [Dorzolamide Hcl-Timolol Mal] Other (See Comments)    Cause heart rate to drop  . Flomax [Tamsulosin  Hcl] Other (See Comments)    Low BP  . Morphine And Related Other (See Comments)    hallucinations  . Oxytrol [Oxybutynin] Rash    Family History  Problem Relation Age of Onset  . Diabetes Mother   . Hypertension Mother   . Prostate cancer Father   . Heart disease Father   . Diabetes Brother   . Cancer Brother   . Heart disease Brother   . Diabetes Sister   . Heart disease Sister   . Hyperlipidemia Child   . Hypertension Child   . Diabetes Child   . Heart attack Child   . Cancer Child     Prior to Admission medications   Medication Sig Start Date End Date Taking? Authorizing Provider  aspirin 81 MG tablet Take 81 mg by mouth daily.   Yes Historical Provider, MD  carvedilol (COREG) 12.5 MG tablet TAKE  (1)  TABLET TWICE A DAY FOR HIGH BLOOD PRESSURE. 09/26/16  Yes Vicie Mutters, PA-C  Cholecalciferol (VITAMIN D3) 5000 UNITS CAPS Take 5,000 Units by mouth 2 (two) times daily.    Yes Historical Provider, MD  dextromethorphan-guaiFENesin (MUCINEX DM) 30-600 MG 12hr tablet Take 1 tablet by mouth 2 (two) times daily.   Yes Historical Provider, MD  diltiazem (CARDIZEM CD) 240 MG 24 hr capsule TAKE (1) CAPSULE DAILY FOR HIGH BLOOD PRESSURE. 08/26/16  Yes [name], MD  furosemide (LASIX) 40 MG tablet TAKE 1 OR 2 TABLETS ONCE DAILY FOR BLOOD PRESSURE AND ANKLE SWELLING. 01/16/16  Yes [name], MD  hydrALAZINE (APRESOLINE) 25 MG tablet TAKE ONE TABLET 3 TIMES A DAY FOR BLOOD PRESSURE. 07/29/16  Yes [name], MD  Insulin Detemir (LEVEMIR FLEXTOUCH) 100 UNIT/ML Pen Inject 50 Units into the skin every morning. And pen needles 1/day 09/26/16  Yes Renato Shin, MD  LUMIGAN 0.01 % SOLN Place 1 drop into the right eye at bedtime.  03/29/13  Yes Historical Provider, MD  Multiple Vitamin (MULTIVITAMIN) tablet Take 1 tablet by mouth daily.     Yes Historical Provider, MD  MYRBETRIQ 50 MG TB24 tablet Take 50 mg by mouth daily.  12/10/14  Yes Historical Provider, MD  pravastatin  (PRAVACHOL) 40 MG tablet TAKE 1 TABLET ONCE DAILY FOR CHOLESTEROL. 09/26/16  Yes Vicie Mutters, PA-C  Probiotic Product (PROBIOTIC DAILY PO) Take 1 tablet by mouth daily.    Yes Historical Provider, MD  SIMBRINZA 1-0.2 % SUSP Place 1 drop into both eyes 3 (three) times daily. 02/28/13  Yes Historical Provider, MD  hyoscyamine (LEVSIN/SL) 0.125 MG SL tablet Place 1 tablet (0.125 mg total) under the tongue every 4 (four) hours as needed. Patient not taking: Reported on 10/06/2016 08/22/14   Carolan Clines, MD  senna-docusate (SENOKOT-S) 8.6-50 MG tablet Take 1 tablet by mouth at bedtime. Patient taking differently: Take 1 tablet by mouth daily.  05/09/15   Eugenie Filler, MD    Physical Exam: Vitals:   10/06/16 1330 10/06/16 1345 10/06/16 1400 10/06/16 1439  BP: (!) 157/69 (!) 149/70 138/65 (!) 143/61  Pulse: 63 (!) 59 (!) 57   Resp: (!) 27 (!) 23 20  16  Temp:    98.1 F (36.7 C)  TempSrc:    Oral  SpO2: 95% 95% 96% 100%  Weight:      Height:         General:  Appears calm and comfortable  Eyes:  PERRL, EOMI, normal lids, iris ENT:  grossly normal hearing, lips & tongue, mmm Neck:  no LAD, masses or thyromegaly Cardiovascular:  RRR, no m/r/g. 1+ LE edema.  Respiratory:  CTA bilaterally, no w/r/r. Normal respiratory effort. Abdomen:  soft, ntnd, NABS Skin:  no rash or induration seen on limited exam Musculoskeletal:  grossly normal tone BUE/BLE, good ROM, no bony abnormality Psychiatric:  grossly normal mood and affect, speech fluent and appropriate, AOx3 Neurologic:  CN 2-12 grossly intact, moves all extremities in coordinated fashion, sensation intact  Labs on Admission: I have personally reviewed following labs and imaging studies  CBC:  Recent Labs Lab 10/06/16 1025  WBC 8.1  HGB 11.6*  HCT 35.7*  MCV 89.3  PLT 106   Basic Metabolic Panel:  Recent Labs Lab 10/06/16 1025  NA 133*  K 4.2  CL 100*  CO2 23  GLUCOSE 343*  BUN 40*  CREATININE 2.45*  CALCIUM  9.0  MG 2.0  PHOS 3.9   GFR: Estimated Creatinine Clearance: 24.3 mL/min (A) (by C-G formula based on SCr of 2.45 mg/dL (H)). Liver Function Tests:  Recent Labs Lab 10/06/16 1025  AST 28  ALT 23  ALKPHOS 67  BILITOT 0.2*  PROT 6.9  ALBUMIN 3.8   No results for input(s): LIPASE, AMYLASE in the last 168 hours. No results for input(s): AMMONIA in the last 168 hours. Coagulation Profile: No results for input(s): INR, PROTIME in the last 168 hours. Cardiac Enzymes:  Recent Labs Lab 10/06/16 1149  TROPONINI <0.03   BNP (last 3 results) No results for input(s): PROBNP in the last 8760 hours. HbA1C: No results for input(s): HGBA1C in the last 72 hours. CBG: No results for input(s): GLUCAP in the last 168 hours. Lipid Profile: No results for input(s): CHOL, HDL, LDLCALC, TRIG, CHOLHDL, LDLDIRECT in the last 72 hours. Thyroid Function Tests:  Recent Labs  10/06/16 1025  TSH 2.471   Anemia Panel: No results for input(s): VITAMINB12, FOLATE, FERRITIN, TIBC, IRON, RETICCTPCT in the last 72 hours. Urine analysis:    Component Value Date/Time   COLORURINE YELLOW 10/06/2016 1249   APPEARANCEUR CLEAR 10/06/2016 1249   LABSPEC 1.011 10/06/2016 1249   PHURINE 5.0 10/06/2016 1249   GLUCOSEU 150 (A) 10/06/2016 1249   HGBUR NEGATIVE 10/06/2016 1249   BILIRUBINUR NEGATIVE 10/06/2016 1249   KETONESUR NEGATIVE 10/06/2016 1249   PROTEINUR NEGATIVE 10/06/2016 1249   UROBILINOGEN 0.2 05/05/2015 1023   NITRITE NEGATIVE 10/06/2016 1249   LEUKOCYTESUR NEGATIVE 10/06/2016 1249    Creatinine Clearance: Estimated Creatinine Clearance: 24.3 mL/min (A) (by C-G formula based on SCr of 2.45 mg/dL (H)).  Sepsis Labs: @LABRCNTIP (procalcitonin:4,lacticidven:4) )No results found for this or any previous visit (from the past 240 hour(s)).   Radiological Exams on Admission: Dg Chest Portable 1 View  Result Date: 10/06/2016 CLINICAL DATA:  Bradycardia. EXAM: PORTABLE CHEST 1 VIEW  COMPARISON:  05/05/2015.  04/02/2014. FINDINGS: Mediastinum and hilar structures normal. Bibasilar infiltrates. Cardiomegaly. No pleural effusion or pneumothorax. Degenerative changes thoracic spine and both shoulders. IMPRESSION: 1.  Cardiomegaly.  No pulmonary venous congestion. 2.  Mild bibasilar pulmonary infiltrates. Electronically Signed   By: Marcello Moores  Register   On: 10/06/2016 11:55    EKG: Independently reviewed.  Initially in a junctional rhythm and bradycardic w/ a rate of 36. Repeat NSR w/ a rate of 55.   Assessment/Plan Active Problems:   SDAT (senile dementia of Alzheimer's type)   Essential hypertension   Medication management   CKD stage 3 due to type 2 diabetes mellitus (HCC)   Hyperlipidemia   Symptomatic bradycardia   Diabetes mellitus with complication (HCC)   AKI (acute kidney injury) ([place])   Chronic diastolic CHF (congestive heart failure) (HCC)   Symptomatic bradycardia: Likely secondary to unintentional medication overdose patient with many problems and taking Coreg and diltiazem. Patient endorses changing around when he was taking his doses of his medicines. Additionally may be in part related to worsening renal function. Patient received calcium gluconate upon arrival in the ED with resolution of junctional rhythm and converted to sinus. Patient far less symptomatic at this time. No significant electrolyte or hormonal abnormalities abnormalities noted. - Hold Coreg and diltiazem. Depending on resolution of cardiac symptoms and return of renal function doses of medicines will need to be altered at time of discharge. - Tele - Cards eval in am if sx return  AoCKD: Cr. 2.45 w/ baseline 1.9. Suspect due to hypoperfusion over the last 24 hours versus progressive decline with new baseline. Mild decrease in oral intake reported over the last 24 hours but doubt truly contributory. Followed by Dr. Mercy Moore of [facility]. No change in GU habits. Patient does report being told on his last  lab work from the [place] a few days ago that he had increased protein in his urine. UA sample at time of admission the glucose but no protein and no evidence of infection, spec grav 1.011. - IVF - BMET in am - if not responding then consult renal in am   Dementia: early stages. Home health aid to assist w/ ADLs. Question pts ability to take meds accurately.  - MMSE after DC  HTN: - hold lasix, coreg, dilt. - continue home hydralazine   DM:  - SSI - continue levemir  HLD: - continue statin  Diastolic congestive heart failure: Last Echo w/ EF 33% and diastolic dysfunction; well compensated - hold lasix due to renal function - I/O, Daily wts  Bladder spasm: - continue Myrbetriq  DVT prophylaxis: Hep  Code Status: full  Family Communication: daughter and home health aid Disposition Plan: pending improvement  Consults called: none Admission status: observation    Tracye Szuch J MD [facility]  If 7PM-7AM, please contact night-coverage [email or web address] Password [facility]  10/06/2016, 2:48 PM

## 2016-10-06 NOTE — ED Provider Notes (Signed)
[facility] DEPT Provider Note   CSN: [id number] Arrival date & time: 10/06/16  1006     History   Chief Complaint Chief Complaint  Patient presents with  . Bradycardia    HPI Gregory Hatfield is a 83 y.o. male.  HPI Patient is brought to the emergency department by EMS after being found to be weak and bradycardic at the optometry office.  He states his felt weak over the past several days and his caretaker asked that his vital signs be taken while at the optometry office where he was found to have heart rates in the 30s and EMS brought him to the ER.  He reports no recent changes medications.  Review of his medication list does demonstrate he is both on Coreg and diltiazem.  He denies chest pain and shortness of breath.  He's had no recent illness.  He reports his appetite is been normal.  He denies chest pain or chest tightness.  No abdominal pain.  No other symptoms at this time.  Patient presents to the emergency department with heart rates in the mid 30s which appears to be a junctional bradycardia.  No P waves are present   Past Medical History:  Diagnosis Date  . CKD stage 4 due to type 2 diabetes mellitus ([place]) 06/03/2015  . Dyslipidemia associated with type 2 diabetes mellitus ([place]) 05/05/2015  . Essential hypertension 05/05/2015  . IDDM (insulin dependent diabetes mellitus) ([place])   . SDAT (senile dementia of Alzheimer's type) 06/16/2013  . Uncontrolled insulin-dependent diabetes mellitus with renal manifestation ([place]) 05/05/2015    Patient Active Problem List   Diagnosis Date Noted  . Hyperlipidemia 01/12/2016  . Prostate cancer ([place]) 10/12/2015  . Diabetic retinopathy ([place]) 10/12/2015  . Encounter for Medicare annual wellness exam 10/12/2015  . Open-angle glaucoma 07/06/2015  . Vitamin D deficiency 06/03/2015  . Medication management 06/03/2015  . CKD stage 3 due to type 2 diabetes mellitus ([place]) 06/03/2015  . Insulin-requiring or dependent type II diabetes  mellitus ([place]) 05/05/2015  . Essential hypertension 05/05/2015  . SDAT (senile dementia of Alzheimer's type) 06/16/2013    Past Surgical History:  Procedure Laterality Date  . CARDIAC CATHETERIZATION  04/2004   normal r-sided pressure, mild pulm HTN  . CARPAL TUNNEL RELEASE Right   . CERVICAL SPINE SURGERY  1990's  . LUMBAR SPINE SURGERY  2003  . PROSTATE SURGERY  10/2010   transurethral resection   . TOTAL HIP ARTHROPLASTY Left 01/17/2014   Procedure: LEFT TOTAL HIP ARTHROPLASTY;  Surgeon: Yvette Rack., MD;  Location: [facility];  Service: Orthopedics;  Laterality: Left;  . TRANSTHORACIC ECHOCARDIOGRAM  05/2006   EF 60-70%; mild calcif of MV; mild MV regurg; LA mildly dilated  . TRANSURETHRAL RESECTION OF PROSTATE N/A 08/22/2014   Procedure: TRANSURETHRAL RESECTION OF THE PROSTATE (TURP);  Surgeon: Marcia Brash, MD;  Location: [facility];  Service: Urology;  Laterality: N/A;       Home Medications    Prior to Admission medications   Medication Sig Start Date End Date Taking? Authorizing Provider  aspirin 81 MG tablet Take 81 mg by mouth daily.   Yes Historical Provider, MD  carvedilol (COREG) 12.5 MG tablet TAKE  (1)  TABLET TWICE A DAY FOR HIGH BLOOD PRESSURE. 09/26/16  Yes Vicie Mutters, PA-C  Cholecalciferol (VITAMIN D3) 5000 UNITS CAPS Take 5,000 Units by mouth 2 (two) times daily.    Yes Historical Provider, MD  dextromethorphan-guaiFENesin (MUCINEX DM) 30-600 MG 12hr tablet Take 1  tablet by mouth 2 (two) times daily.   Yes Historical Provider, MD  diltiazem (CARDIZEM CD) 240 MG 24 hr capsule TAKE (1) CAPSULE DAILY FOR HIGH BLOOD PRESSURE. 08/26/16  Yes [name], MD  furosemide (LASIX) 40 MG tablet TAKE 1 OR 2 TABLETS ONCE DAILY FOR BLOOD PRESSURE AND ANKLE SWELLING. 01/16/16  Yes [name], MD  hydrALAZINE (APRESOLINE) 25 MG tablet TAKE ONE TABLET 3 TIMES A DAY FOR BLOOD PRESSURE. 07/29/16  Yes [name], MD  Insulin Detemir (LEVEMIR FLEXTOUCH) 100 UNIT/ML Pen  Inject 50 Units into the skin every morning. And pen needles 1/day 09/26/16  Yes Renato Shin, MD  LUMIGAN 0.01 % SOLN Place 1 drop into the right eye at bedtime.  03/29/13  Yes Historical Provider, MD  Multiple Vitamin (MULTIVITAMIN) tablet Take 1 tablet by mouth daily.     Yes Historical Provider, MD  MYRBETRIQ 50 MG TB24 tablet Take 50 mg by mouth daily.  12/10/14  Yes Historical Provider, MD  pravastatin (PRAVACHOL) 40 MG tablet TAKE 1 TABLET ONCE DAILY FOR CHOLESTEROL. 09/26/16  Yes Vicie Mutters, PA-C  Probiotic Product (PROBIOTIC DAILY PO) Take 1 tablet by mouth daily.    Yes Historical Provider, MD  SIMBRINZA 1-0.2 % SUSP Place 1 drop into both eyes 3 (three) times daily. 02/28/13  Yes Historical Provider, MD  hyoscyamine (LEVSIN/SL) 0.125 MG SL tablet Place 1 tablet (0.125 mg total) under the tongue every 4 (four) hours as needed. Patient not taking: Reported on 10/06/2016 08/22/14   Carolan Clines, MD  senna-docusate (SENOKOT-S) 8.6-50 MG tablet Take 1 tablet by mouth at bedtime. Patient taking differently: Take 1 tablet by mouth daily.  05/09/15   Eugenie Filler, MD    Family History Family History  Problem Relation Age of Onset  . Diabetes Mother   . Hypertension Mother   . Prostate cancer Father   . Heart disease Father   . Diabetes Brother   . Cancer Brother   . Heart disease Brother   . Diabetes Sister   . Heart disease Sister   . Hyperlipidemia Child   . Hypertension Child   . Diabetes Child   . Heart attack Child   . Cancer Child     Social History Social History  Substance Use Topics  . Smoking status: Former Smoker    Quit date: 06/21/1983  . Smokeless tobacco: Never Used  . Alcohol use No     Allergies   Ace inhibitors; Demerol; Cosopt [dorzolamide hcl-timolol mal]; Flomax [tamsulosin hcl]; Morphine and related; and Oxytrol [oxybutynin]   Review of Systems Review of Systems  All other systems reviewed and are negative.    Physical Exam Updated  Vital Signs BP 134/71   Pulse (!) 51   Temp 97.4 F (36.3 C)   Resp 11   Ht 5\' 10"  (1.778 m)   Wt 212 lb (96.2 kg)   SpO2 98%   BMI 30.42 kg/m   Physical Exam  Constitutional: He is oriented to person, place, and time. He appears well-developed and well-nourished.  HENT:  Head: Normocephalic and atraumatic.  Eyes: EOM are normal.  Neck: Normal range of motion.  Cardiovascular: Intact distal pulses.   Bradycardic rate  Pulmonary/Chest: Effort normal and breath sounds normal. No respiratory distress.  Abdominal: Soft. He exhibits no distension. There is no tenderness.  Musculoskeletal: Normal range of motion.  Neurological: He is alert and oriented to person, place, and time.  Skin: Skin is warm and dry.  Psychiatric: He has  a normal mood and affect. Judgment normal.  Nursing note and vitals reviewed.    ED Treatments / Results  Labs (all labs ordered are listed, but only abnormal results are displayed) Labs Reviewed  CBC - Abnormal; Notable for the following:       Result Value   RBC 4.00 (*)    Hemoglobin 11.6 (*)    HCT 35.7 (*)    All other components within normal limits  COMPREHENSIVE METABOLIC PANEL - Abnormal; Notable for the following:    Sodium 133 (*)    Chloride 100 (*)    Glucose, Bld 343 (*)    BUN 40 (*)    Creatinine, Ser 2.45 (*)    Total Bilirubin 0.2 (*)    GFR calc non Af Amer 22 (*)    GFR calc Af Amer 25 (*)    All other components within normal limits  TSH  MAGNESIUM  PHOSPHORUS  TROPONIN I  URINALYSIS, ROUTINE W REFLEX MICROSCOPIC   BUN  Date Value Ref Range Status  10/06/2016 40 (H) 6 - 20 mg/dL Final  07/18/2016 25 7 - 25 mg/dL Final  04/14/2016 31 (H) 7 - 25 mg/dL Final  01/12/2016 30 (H) 7 - 25 mg/dL Final  05/14/2015 26 (A) 4 - 21 mg/dL Final   Creat  Date Value Ref Range Status  07/18/2016 1.89 (H) 0.70 - 1.11 mg/dL Final    Comment:      For patients > or = 81 years of age: The upper reference limit for Creatinine is  approximately 13% higher for people identified as African-American.     04/14/2016 1.80 (H) 0.70 - 1.11 mg/dL Final    Comment:      For patients > or = 81 years of age: The upper reference limit for Creatinine is approximately 13% higher for people identified as African-American.     01/12/2016 1.89 (H) 0.70 - 1.11 mg/dL Final    Comment:      For patients > or = 81 years of age: The upper reference limit for Creatinine is approximately 13% higher for people identified as African-American.     10/12/2015 1.77 (H) 0.70 - 1.11 mg/dL Final   Creatinine, Ser  Date Value Ref Range Status  10/06/2016 2.45 (H) 0.61 - 1.24 mg/dL Final      EKG  EKG Interpretation #1  Date/Time:  Thursday October 06 2016 10:11:23 EDT Ventricular Rate:  36 PR Interval:    QRS Duration: 112 QT Interval:  494 QTC Calculation: 383 R Axis:   -9 Text Interpretation:  Junctional rhythm Incomplete left bundle branch block Low voltage, precordial leads Anterior Q waves, possibly due to ILBBB Baseline wander in lead(s) II III aVF new junctional rhythm Confirmed by Khris Jansson  MD, Keeva Reisen ([id number]) on 10/06/2016 11:37:13 AM       EKG Interpretation #2  Date/Time:  Thursday October 06 2016 12:43:19 EDT Ventricular Rate:  55 PR Interval:    QRS Duration: 111 QT Interval:  451 QTC Calculation: 432 R Axis:   -14 Text Interpretation:  Sinus rhythm Prolonged PR interval Probable anteroseptal infarct, old First degree A-V block junctional rhythm resolved Confirmed by Theus Espin  MD, Lennette Bihari ([id number]) on 10/06/2016 1:05:52 PM         Radiology Dg Chest Portable 1 View  Result Date: 10/06/2016 CLINICAL DATA:  Bradycardia. EXAM: PORTABLE CHEST 1 VIEW COMPARISON:  05/05/2015.  04/02/2014. FINDINGS: Mediastinum and hilar structures normal. Bibasilar infiltrates. Cardiomegaly. No pleural effusion or pneumothorax. Degenerative changes  thoracic spine and both shoulders. IMPRESSION: 1.  Cardiomegaly.  No pulmonary venous  congestion. 2.  Mild bibasilar pulmonary infiltrates. Electronically Signed   By: Marcello Moores  Register   On: 10/06/2016 11:55    Procedures Procedures (including critical care time)  Medications Ordered in ED Medications  sodium chloride 0.9 % bolus 1,000 mL (not administered)  calcium gluconate 1 g in sodium chloride 0.9 % 100 mL IVPB (1 g Intravenous New Bag/Given 10/06/16 1047)     Initial Impression / Assessment and Plan / ED Course  I have reviewed the triage vital signs and the nursing notes.  Pertinent labs & imaging results that were available during my care of the patient were reviewed by me and considered in my medical decision making (see chart for details).     Patient presents in a junctional bradycardia.  This then reverted after some time and calcium back into a sinus rhythm with P waves in first-degree AV block.  He does appear to have acute renal failure.  I suspect much of his bradycardia secondary to medications and his beta blocker and calcium channel blockers will need to be held.  Final Clinical Impressions(s) / ED Diagnoses   Final diagnoses:  Bradycardia  AKI (acute kidney injury) ([place])  Weakness    New Prescriptions New Prescriptions   No medications on file     Jola Schmidt, MD 10/06/16 1310

## 2016-10-07 DIAGNOSIS — E11319 Type 2 diabetes mellitus with unspecified diabetic retinopathy without macular edema: Secondary | ICD-10-CM | POA: Diagnosis not present

## 2016-10-07 DIAGNOSIS — N179 Acute kidney failure, unspecified: Secondary | ICD-10-CM | POA: Diagnosis not present

## 2016-10-07 DIAGNOSIS — R001 Bradycardia, unspecified: Secondary | ICD-10-CM | POA: Diagnosis not present

## 2016-10-07 DIAGNOSIS — R531 Weakness: Secondary | ICD-10-CM | POA: Diagnosis not present

## 2016-10-07 LAB — COMPREHENSIVE METABOLIC PANEL
ALK PHOS: 61 U/L (ref 38–126)
ALT: 22 U/L (ref 17–63)
AST: 23 U/L (ref 15–41)
Albumin: 3.3 g/dL — ABNORMAL LOW (ref 3.5–5.0)
Anion gap: 11 (ref 5–15)
BUN: 34 mg/dL — AB (ref 6–20)
CALCIUM: 8.9 mg/dL (ref 8.9–10.3)
CO2: 21 mmol/L — AB (ref 22–32)
CREATININE: 2 mg/dL — AB (ref 0.61–1.24)
Chloride: 107 mmol/L (ref 101–111)
GFR calc Af Amer: 33 mL/min — ABNORMAL LOW (ref >60)
GFR calc non Af Amer: 28 mL/min — ABNORMAL LOW (ref >60)
Glucose, Bld: 157 mg/dL — ABNORMAL HIGH (ref 65–99)
Potassium: 3.3 mmol/L — ABNORMAL LOW (ref 3.5–5.1)
SODIUM: 139 mmol/L (ref 135–145)
Total Bilirubin: 0.5 mg/dL (ref 0.3–1.2)
Total Protein: 6.5 g/dL (ref 6.5–8.1)

## 2016-10-07 LAB — CBC
HCT: 36.2 % — ABNORMAL LOW (ref 39.0–52.0)
HEMOGLOBIN: 11.5 g/dL — AB (ref 13.0–17.0)
MCH: 28.5 pg (ref 26.0–34.0)
MCHC: 31.8 g/dL (ref 30.0–36.0)
MCV: 89.8 fL (ref 78.0–100.0)
Platelets: 174 10*3/uL (ref 150–400)
RBC: 4.03 MIL/uL — AB (ref 4.22–5.81)
RDW: 13.7 % (ref 11.5–15.5)
WBC: 7.4 10*3/uL (ref 4.0–10.5)

## 2016-10-07 LAB — GLUCOSE, CAPILLARY
Glucose-Capillary: 190 mg/dL — ABNORMAL HIGH (ref 65–99)
Glucose-Capillary: 171 mg/dL — ABNORMAL HIGH (ref 65–99)

## 2016-10-07 LAB — HEMOGLOBIN A1C
Hgb A1c MFr Bld: 9.7 % — ABNORMAL HIGH (ref 4.8–5.6)
MEAN PLASMA GLUCOSE: 232 mg/dL

## 2016-10-07 LAB — TROPONIN I: Troponin I: <0.03 ng/mL (ref <0.03)

## 2016-10-07 MED ORDER — DILTIAZEM HCL ER COATED BEADS 120 MG PO CP24: 120.0000 mg | ORAL_CAPSULE | Freq: Every day | ORAL | 0 refills | Status: DC

## 2016-10-07 MED ORDER — POTASSIUM CHLORIDE CRYS ER 20 MEQ PO TBCR
40.0000 meq | EXTENDED_RELEASE_TABLET | Freq: Four times a day (QID) | ORAL | Status: AC
Start: 2016-10-07 — End: 2016-10-07
  Administered 2016-10-07 (×2): 40 meq via ORAL
  Filled 2016-10-07 (×2): qty 2

## 2016-10-07 MED ORDER — CARVEDILOL 6.25 MG PO TABS: 6.2500 mg | ORAL_TABLET | Freq: Two times a day (BID) | ORAL | 0 refills | Status: DC

## 2016-10-07 NOTE — Progress Notes (Signed)
Notified by CCMD that patient was having pauses: 3.0, 2.5, and 3.07 seconds.  Patient is asymptomatic. On call Tylene Fantasia, NP made aware.  Stat EKG ordered. Will continue to monitor.

## 2016-10-07 NOTE — Discharge Summary (Signed)
Gregory Hatfield POI:[id number] DOB: 1925/12/17 DOA: 10/06/2016  PCP: No primary care provider on file.  Admit date: 10/06/2016  Discharge date: 10/07/2016  Admitted From: Home  Disposition:  Hom   Recommendations for Outpatient Follow-up:   Follow up with PCP in 1-2 weeks  PCP Please obtain BMP/CBC, 2 view CXR in 1week,  (see Discharge instructions)   PCP Please follow up on the following pending results: Monitor BP and Heart Rate closely   Home Health: PT,RN   Equipment/Devices: None  Consultations: None Discharge Condition: Fair   CODE STATUS: Full   Diet Recommendation:  Heart Healthy    Chief Complaint  Patient presents with  . Bradycardia     Brief history of present illness from the day of admission and additional interim summary    Gregory Hatfield is a 83 y.o. male with medical history significant of CKD4 diabetes, early dementia, hypertension. Patient presenting with a four-hour history of gradual onset fatigue and generalized weakness, workup suggested symptomatic bradycardia in the presence of high dose beta blocker and calcium channel blocker with stable TSH.                                                                 Hospital Course     Symptomatic bradycardia causing fatigue and generalized weakness. Caused due to presence of high dose beta blocker and calcium channel blocker, both medications reduced in half, heart rate blood pressure much improved, excellent chronotropic response without any symptoms. Will be discharged home with outpatient PCP and cardiologist Dr Debara Pickett follow-up. Home PT ordered as well. TSH was stable.  Dementia. No acute issues. Continue home medications unchanged.  ARF on CK D stage IV. Patient follows with Dr. Mercy Moore, was gently hydrated, renal function  close to baseline, requested to follow with Dr. Mercy Moore. Note he takes when necessary Lasix which for now has been continued. Request PCP and primary nephrologist to monitor his renal function closely.  Hypertension. Home does hydralazine continued, Coreg and diltiazem have been cut in half due to bradycardia, PCP to monitor blood pressure closely.  DM2 - on home regimen, follow with Dr Loanne Drilling.  Discharge diagnosis     Active Problems:   SDAT (senile dementia of Alzheimer's type)   Essential hypertension   Medication management   CKD stage 3 due to type 2 diabetes mellitus (H[facility])   Hyperlipidemia   Symptomatic bradycardia   Diabetes mellitus with complication (H[facility])   AKI (acute kidney injury) ([place])   Chronic diastolic CHF (congestive heart failure) (H[facility])   Bradycardia    Discharge instructions    Discharge Instructions    Diet - low sodium heart healthy    Complete by:  As directed    Discharge instructions    Complete by:  As directed  Follow with Primary MD in 7 days   Get CBC, CMP, 2 view Chest X ray checked  by Primary MD or SNF MD in 5-7 days ( we routinely change or add medications that can affect your baseline labs and fluid status, therefore we recommend that you get the mentioned basic workup next visit with your PCP, your PCP may decide not to get them or add new tests based on their clinical decision)  Activity: As tolerated with Full fall precautions use walker/cane & assistance as needed  Disposition Home     Diet: Heart Healthy    For Heart failure patients - Check your Weight same time everyday, if you gain over 2 pounds, or you develop in leg swelling, experience more shortness of breath or chest pain, call your Primary MD immediately. Follow Cardiac Low Salt Diet and 1.5 lit/day fluid restriction.  On your next visit with your primary care physician please Get Medicines reviewed and adjusted.  Please request your Prim.MD to go over all Hospital Tests  and Procedure/Radiological results at the follow up, please get all Hospital records sent to your Prim MD by signing hospital release before you go home.  If you experience worsening of your admission symptoms, develop shortness of breath, life threatening emergency, suicidal or homicidal thoughts you must seek medical attention immediately by calling 911 or calling your MD immediately  if symptoms less severe.  You Must read complete instructions/literature along with all the possible adverse reactions/side effects for all the Medicines you take and that have been prescribed to you. Take any new Medicines after you have completely understood and accpet all the possible adverse reactions/side effects.   Do not drive, operate heavy machinery, perform activities at heights, swimming or participation in water activities or provide baby sitting services if your were admitted for syncope or siezures until you have seen by Primary MD or a Neurologist and advised to do so again.  Do not drive when taking Pain medications.    Do not take more than prescribed Pain, Sleep and Anxiety Medications  Special Instructions: If you have smoked or chewed Tobacco  in the last 2 yrs please stop smoking, stop any regular Alcohol  and or any Recreational drug use.  Wear Seat belts while driving.   Please note  You were cared for by a hospitalist during your hospital stay. If you have any questions about your discharge medications or the care you received while you were in the hospital after you are discharged, you can call the unit and asked to speak with the hospitalist on call if the hospitalist that took care of you is not available. Once you are discharged, your primary care physician will handle any further medical issues. Please note that NO REFILLS for any discharge medications will be authorized once you are discharged, as it is imperative that you return to your primary care physician (or establish a  relationship with a primary care physician if you do not have one) for your aftercare needs so that they can reassess your need for medications and monitor your lab values.   Increase activity slowly    Complete by:  As directed       Discharge Medications   Allergies as of 10/07/2016      Reactions   Ace Inhibitors Other (See Comments), Cough   Chronic cough   Demerol Other (See Comments)   Hard to wake up   Cosopt [dorzolamide Hcl-timolol Mal] Other (See Comments)   Cause  heart rate to drop   Flomax [tamsulosin Hcl] Other (See Comments)   Low BP   Morphine And Related Other (See Comments)   hallucinations   Oxytrol [oxybutynin] Rash      Medication List    TAKE these medications   aspirin 81 MG tablet Take 81 mg by mouth daily.   carvedilol 6.25 MG tablet Commonly known as:  COREG Take 1 tablet (6.25 mg total) by mouth 2 (two) times daily with a meal. What changed:  See the new instructions.   dextromethorphan-guaiFENesin 30-600 MG 12hr tablet Commonly known as:  MUCINEX DM Take 1 tablet by mouth 2 (two) times daily.   diltiazem 120 MG 24 hr capsule Commonly known as:  CARDIZEM CD Take 1 capsule (120 mg total) by mouth daily. What changed:  See the new instructions.   furosemide 40 MG tablet Commonly known as:  LASIX TAKE 1 OR 2 TABLETS ONCE DAILY FOR BLOOD PRESSURE AND ANKLE SWELLING.   hydrALAZINE 25 MG tablet Commonly known as:  APRESOLINE TAKE ONE TABLET 3 TIMES A DAY FOR BLOOD PRESSURE.   hyoscyamine 0.125 MG SL tablet Commonly known as:  LEVSIN/SL Place 1 tablet (0.125 mg total) under the tongue every 4 (four) hours as needed.   Insulin Detemir 100 UNIT/ML Pen Commonly known as:  LEVEMIR FLEXTOUCH Inject 50 Units into the skin every morning. And pen needles 1/day   LUMIGAN 0.01 % Soln Generic drug:  bimatoprost Place 1 drop into the right eye at bedtime.   multivitamin tablet Take 1 tablet by mouth daily.   MYRBETRIQ 50 MG Tb24 tablet Generic  drug:  mirabegron ER Take 50 mg by mouth daily.   pravastatin 40 MG tablet Commonly known as:  PRAVACHOL TAKE 1 TABLET ONCE DAILY FOR CHOLESTEROL.   PROBIOTIC DAILY PO Take 1 tablet by mouth daily.   senna-docusate 8.6-50 MG tablet Commonly known as:  Senokot-S Take 1 tablet by mouth at bedtime. What changed:  when to take this   SIMBRINZA 1-0.2 % Susp Generic drug:  Brinzolamide-Brimonidine Place 1 drop into both eyes 3 (three) times daily.   Vitamin D3 5000 units Caps Take 5,000 Units by mouth 2 (two) times daily.       Follow-up Information    MCKEOWN,WILLIAM DAVID, MD Follow up.   Specialty:  Internal Medicine Contact information: [address] [place] [address] [phone]        Windy Kalata, MD. Schedule an appointment as soon as possible for a visit in 1 week(s).   Specialty:  Nephrology Contact information: [place] [address] [phone]        Pixie Casino, MD. Schedule an appointment as soon as possible for a visit in 1 week(s).   Specialty:  Cardiology Contact information: [place] [address] [phone]        Renato Shin, MD. Schedule an appointment as soon as possible for a visit in 1 week(s).   Specialty:  Endocrinology Contact information: [address] [place] [address] [phone]           Major procedures and Radiology Reports - PLEASE review detailed and final reports thoroughly  -     TTE  Left ventricle: The cavity size was normal. Wall thickness was increased in a pattern of moderate LVH. Systolic function was   normal. The estimated ejection fraction was in the range of 55%  to 60%. Wall motion was normal; there were  no regional wall   motion abnormalities.    Dg Chest Portable 1 View  Result Date: 10/06/2016 CLINICAL DATA:  Bradycardia. EXAM: PORTABLE CHEST 1 VIEW COMPARISON:  05/05/2015.  04/02/2014. FINDINGS:  Mediastinum and hilar structures normal. Bibasilar infiltrates. Cardiomegaly. No pleural effusion or pneumothorax. Degenerative changes thoracic spine and both shoulders. IMPRESSION: 1.  Cardiomegaly.  No pulmonary venous congestion. 2.  Mild bibasilar pulmonary infiltrates. Electronically Signed   By: Marcello Moores  Register   On: 10/06/2016 11:55    Micro Results     No results found for this or any previous visit (from the past 240 hour(s)).  Today   Subjective    Tee Richeson today has no headache,no chest abdominal pain,no new weakness tingling or numbness, feels much better wants to go home today.     Objective   Blood pressure 165/75, pulse 71, temperature 97.9 F (36.6 C), temperature source Oral, resp. rate 17, height 5\' 10"  (1.778 m), weight 96.2 kg (212 lb), SpO2 98 %.   Intake/Output Summary (Last 24 hours) at 10/07/16 1205 Last data filed at 10/07/16 1100  Gross per 24 hour  Intake           2732.5 ml  Output             1175 ml  Net           1557.5 ml    Exam Awake Alert, Oriented x 3, No new F.N deficits, Normal affect [place].AT,PERRAL Supple Neck,No JVD, No cervical lymphadenopathy appriciated.  Symmetrical Chest wall movement, Good air movement bilaterally, CTAB RRR,No Gallops,Rubs or new Murmurs, No Parasternal Heave +ve B.Sounds, Abd Soft, Non tender, No organomegaly appriciated, No rebound -guarding or rigidity. No Cyanosis, Clubbing or edema, No new Rash or bruise   Data Review   CBC w Diff:  Lab Results  Component Value Date   WBC 7.4 10/07/2016   HGB 11.5 (L) 10/07/2016   HCT 36.2 (L) 10/07/2016   PLT 174 10/07/2016   LYMPHOPCT 37 07/18/2016   MONOPCT 10 07/18/2016   EOSPCT 2 07/18/2016   BASOPCT 1 07/18/2016    CMP:  Lab Results  Component Value Date   NA 139 10/07/2016   NA 143 05/14/2015   K 3.3 (L) 10/07/2016   CL 107 10/07/2016   CO2 21 (L) 10/07/2016   BUN 34 (H) 10/07/2016   BUN 26 (A) 05/14/2015   CREATININE 2.00 (H) 10/07/2016    CREATININE 1.89 (H) 07/18/2016   GLU 120 05/14/2015   PROT 6.5 10/07/2016   ALBUMIN 3.3 (L) 10/07/2016   BILITOT 0.5 10/07/2016   ALKPHOS 61 10/07/2016   AST 23 10/07/2016   ALT 22 10/07/2016  . Lab Results  Component Value Date   TSH 2.471 10/06/2016     Total Time in preparing paper work, data evaluation and todays exam - 69 minutes  Lala Lund M.D on 10/07/2016 at 12:05 PM  [facility]   Office  [phone]

## 2016-10-07 NOTE — Progress Notes (Signed)
Pt ambulated in hallway, BP 183/93, R 67, O2 96, and was asymptomatic. Prior BP 179/75, HR 59, O2 98. Dr. Candiss Norse was notified.

## 2016-10-07 NOTE — Progress Notes (Signed)
Pt and family received discharge instructions and education. Pt and family verbalize understanding. I personally called in pt's prescriptions to the pharmacy of choice. Case manager will follow up about home health.

## 2016-10-07 NOTE — Progress Notes (Signed)
Inpatient Diabetes Program Recommendations  AACE/ADA: New Consensus Statement on Inpatient Glycemic Control (2015)  Target Ranges:  Prepandial:   less than 140 mg/dL      Peak postprandial:   less than 180 mg/dL (1-2 hours)      Critically ill patients:  140 - 180 mg/dL   Results for [name], [name] (MRN [id number]) as of 10/07/2016 08:23  Ref. Range 09/26/2016 13:47 10/06/2016 15:02  Hemoglobin A1C Latest Ref Range: 4.8 - 5.6 % 9.1 9.7 (H)    Admit with: Symptomatic Bradycardia  History: DM, CKD4, Early Dementia  Home DM Meds: Levemir 50 units daily  Current Insulin Orders: Levemir 50 units daily      Novolog Sensitive Correction Scale/ SSI (0-9 units) TID AC + HS      MD- Note patient saw his Endocrinologist Dr. Renato Shin with [facility] on 09/25/16.  At that visit, Dr. Loanne Drilling stated patient "is on a QD insulin regimen, due to noncompliance with multiple daily injections."    At that visit with Dr. Loanne Drilling, pt's Levemir dose was increased to 50 units daily (from 45 units daily).  Note Levemir to start this AM.    Plan to speak with pt today to assess if he is able to take his insulin safely at home.     --Will follow patient during hospitalization--  Wyn Quaker RN, MSN, CDE Diabetes Coordinator Inpatient Glycemic Control Team Team Pager: [phone] (8a-5p)

## 2016-10-07 NOTE — Evaluation (Signed)
Physical Therapy Evaluation Patient Details Name: Gregory Hatfield MRN: [id number] DOB: Aug 09, 1925 Today's Date: 10/07/2016   History of Present Illness  83 y.o. male with medical history significant of CKD4, diabetes, early dementia, hypertension, CHF, CVA, L THA admitted with weakness/fatigue. Dx of bradycardia (HR in 30s).   Clinical Impression  Pt admitted with above diagnosis. Pt currently with functional limitations due to the deficits listed below (see PT Problem List). Pt ambulated 200' with RW with min/guard assist, no loss of balance. SaO2 100% on RA walking, HR 71.  Pt will benefit from skilled PT to increase their independence and safety with mobility to allow discharge to the venue listed below.       Follow Up Recommendations Home health PT    Equipment Recommendations  None recommended by PT    Recommendations for Other Services       Precautions / Restrictions Precautions Precautions: Fall Precaution Comments: 2 falls in past 4 months Restrictions Weight Bearing Restrictions: No      Mobility  Bed Mobility Overal bed mobility: Modified Independent             General bed mobility comments: with bedrail, HOB up 30*  Transfers Overall transfer level: Modified independent Equipment used: Rolling walker (2 wheeled)                Ambulation/Gait Ambulation/Gait assistance: Min guard Ambulation Distance (Feet): 200 Feet Assistive device: Rolling walker (2 wheeled) Gait Pattern/deviations: Step-through pattern;Decreased step length - right;Decreased step length - left;Trunk flexed   Gait velocity interpretation: at or above normal speed for age/gender General Gait Details: VCs for posture, positioning in RW, and to increase step length, no LOB, HR 71, SaO2 100% on RA  Stairs            Wheelchair Mobility    Modified Rankin (Stroke Patients Only)       Balance Overall balance assessment: History of Falls;Needs  assistance Sitting-balance support: Feet supported;No upper extremity supported Sitting balance-Leahy Scale: Good       Standing balance-Leahy Scale: Fair                               Pertinent Vitals/Pain Pain Assessment: No/denies pain    Home Living Family/patient expects to be discharged to:: Private residence Living Arrangements: Other relatives Advertising account executive) Available Help at Discharge: Family;Available PRN/intermittently Type of Home: House Home Access: Stairs to enter Entrance Stairs-Rails: Can reach both;Left;Right Entrance Stairs-Number of Steps: 4 Home Layout: One level Home Equipment: Walker - 2 wheels;Grab bars - tub/shower;Cane - single point;Toilet riser Additional Comments: aide comes 3x/week for bathing    Prior Function Level of Independence: Needs assistance   Gait / Transfers Assistance Needed: uses cane or RW for walking  ADL's / Homemaking Assistance Needed: assist from aide 3x/week        Hand Dominance        Extremity/Trunk Assessment   Upper Extremity Assessment Upper Extremity Assessment: Overall WFL for tasks assessed    Lower Extremity Assessment Lower Extremity Assessment: Overall WFL for tasks assessed (5/5 knee ext B)    Cervical / Trunk Assessment Cervical / Trunk Assessment: Normal  Communication   Communication: No difficulties  Cognition Arousal/Alertness: Awake/alert Behavior During Therapy: WFL for tasks assessed/performed Overall Cognitive Status: History of cognitive impairments - at baseline (h/o Alzheimers)  General Comments: can follow commands, is oriented to self and location, per daughter pt has been encouraged by MD to use RW rather than cane and pt is resistant to this despite several recent falls      General Comments      Exercises     Assessment/Plan    PT Assessment Patient needs continued PT services  PT Problem List Decreased  balance;Decreased mobility       PT Treatment Interventions Gait training;Stair training;Functional mobility training;Balance training;Therapeutic exercise;Therapeutic activities;Patient/family education    PT Goals (Current goals can be found in the Care Plan section)  Acute Rehab PT Goals Patient Stated Goal: return home PT Goal Formulation: With patient/family Time For Goal Achievement: 10/21/16 Potential to Achieve Goals: Good    Frequency Min 3X/week   Barriers to discharge        Co-evaluation               End of Session Equipment Utilized During Treatment: Gait belt Activity Tolerance: Patient tolerated treatment well Patient left: in chair;with call bell/phone within reach;with family/visitor present Nurse Communication: Mobility status PT Visit Diagnosis: History of falling (Z91.81)    Time: 4656-8127 PT Time Calculation (min) (ACUTE ONLY): 35 min   Charges:   PT Evaluation $PT Eval Low Complexity: 1 Procedure PT Treatments $Gait Training: 8-22 mins   PT G Codes:   PT G-Codes **NOT FOR INPATIENT CLASS** Functional Assessment Tool Used: AM-PAC 6 Clicks Basic Mobility Functional Limitation: Mobility: Walking and moving around Mobility: Walking and Moving Around Current Status (N1700): At least 20 percent but less than 40 percent impaired, limited or restricted Mobility: Walking and Moving Around Goal Status [phone]): At least 1 percent but less than 20 percent impaired, limited or restricted     Philomena Doheny 10/07/2016, 10:02 AM [phone]

## 2016-10-07 NOTE — Progress Notes (Addendum)
Spoke w/ pt and his daughter this afternoon about pt's home insulin regimen.  Pt stated he takes Levemir 50 units daily and remembers to rotate insulin sites.  Asked me if he can inject in his upper legs.  I encouraged pt to use his stomach and the back of his arms instead.  Pt also had questions about the priming of his insulin pen.  Concerned that he is wasting insulin with the prime.  Explained to pt that the insulin manufacturers overfill the pens to allow for priming.  Pt asked me if he could prime with one unit instead.  Explained to pt that the manufacturers recommend 2 units but that if he primes with 1 unit and liquid comes out of the tip of the needle than the needle is likely primed.  Pt and daughter had questions about the timing of meals at home.  Encouraged pt to eat three meals a day (being careful with carbohydrate portion sizes) and to spread the meals apart 4-5 hours (recommended breakfast between 8-9am, lunch between 12-1pm and dinner between 5-6 pm.  Pt stated he likes to eat a snack at [place].  Since pt is 83 years old, told pt that if he feels like he needs a snack at bedtime than he should limit it to 15 grams of carbohydrate with some protein (likes to eat yogurt).  Has CBG meter at home and checks very frequently (sometimes 6 times per day).  Knows how to treat Hypoglycemic events at home.  Plans to make follow-up appt with Dr. Loanne Drilling in the next three months (just saw Dr. Loanne Drilling on 09/25/16).      --Will follow patient during hospitalization--  Wyn Quaker RN, MSN, CDE Diabetes Coordinator Inpatient Glycemic Control Team Team Pager: [phone] (8a-5p)

## 2016-10-07 NOTE — Care Management Obs Status (Signed)
[place] NOTIFICATION   Patient Details  Name: Gregory Hatfield MRN: [id number] Date of Birth: [birth year]   Medicare Observation Status Notification Given:  Yes    Bethena Roys, RN 10/07/2016, 2:11 PM

## 2016-10-07 NOTE — Care Management Note (Addendum)
Case Management Note  Patient Details  Name: Gregory Hatfield MRN: [id number] Date of Birth: 15-Feb-1926  Subjective/Objective: Pt presented for symptomatic bradycardia. Pt is from home alone, however has family support. Pt in need of [facility] PT [facility].                    Action/Plan: Agency List provided for patient and he chose [facility] for services. CM did make referral and is waiting on response. SOC to begin within 24-48 hours post d/c.   Expected Discharge Date:  10/07/16               Expected Discharge Plan:  [place]  In-House Referral:  NA  Discharge planning Services  CM Consult  Post Acute Care Choice:  Home Health Choice offered to:  Patient, [facility] POA / Guardian  DME Arranged:  N/A DME Agency:  NA  HH Arranged:  RN, PT [place] Agency:   [facility]  Status of Service:  Completed, signed off  If discussed at [place] of Stay Meetings, dates discussed:    Additional Comments: 1617 10-07-16 Jacqlyn Krauss, RN,BSN [phone] [facility] was not able to accept the patient for services. [facility] assisted with [facility] and pt is agreeable to agency and [place] to begin within 24-48 hours post d/c. CM to call family to make them aware. No further needs from CM at this time.  Bethena Roys, RN 10/07/2016, 2:08 PM

## 2016-10-07 NOTE — Discharge Instructions (Signed)
Follow with Primary MD  in 7 days  ° °Get CBC, CMP, 2 view Chest X ray checked  by Primary MD or SNF MD in 5-7 days ( we routinely change or add medications that can affect your baseline labs and fluid status, therefore we recommend that you get the mentioned basic workup next visit with your PCP, your PCP may decide not to get them or add new tests based on their clinical decision) ° °Activity: As tolerated with Full fall precautions use walker/cane & assistance as needed ° °Disposition Home   ° °Diet:   Heart Healthy   ° °For Heart failure patients - Check your Weight same time everyday, if you gain over 2 pounds, or you develop in leg swelling, experience more shortness of breath or chest pain, call your Primary MD immediately. Follow Cardiac Low Salt Diet and 1.5 lit/day fluid restriction. ° °On your next visit with your primary care physician please Get Medicines reviewed and adjusted. ° °Please request your Prim.MD to go over all Hospital Tests and Procedure/Radiological results at the follow up, please get all Hospital records sent to your Prim MD by signing hospital release before you go home. ° °If you experience worsening of your admission symptoms, develop shortness of breath, life threatening emergency, suicidal or homicidal thoughts you must seek medical attention immediately by calling 911 or calling your MD immediately  if symptoms less severe. ° °You Must read complete instructions/literature along with all the possible adverse reactions/side effects for all the Medicines you take and that have been prescribed to you. Take any new Medicines after you have completely understood and accpet all the possible adverse reactions/side effects.  ° °Do not drive, operate heavy machinery, perform activities at heights, swimming or participation in water activities or provide baby sitting services if your were admitted for syncope or siezures until you have seen by Primary MD or a Neurologist and advised to do  so again. ° °Do not drive when taking Pain medications.  ° ° °Do not take more than prescribed Pain, Sleep and Anxiety Medications ° °Special Instructions: If you have smoked or chewed Tobacco  in the last 2 yrs please stop smoking, stop any regular Alcohol  and or any Recreational drug use. ° °Wear Seat belts while driving. ° ° °Please note ° °You were cared for by a hospitalist during your hospital stay. If you have any questions about your discharge medications or the care you received while you were in the hospital after you are discharged, you can call the unit and asked to speak with the hospitalist on call if the hospitalist that took care of you is not available. Once you are discharged, your primary care physician will handle any further medical issues. Please note that NO REFILLS for any discharge medications will be authorized once you are discharged, as it is imperative that you return to your primary care physician (or establish a relationship with a primary care physician if you do not have one) for your aftercare needs so that they can reassess your need for medications and monitor your lab values. ° °

## 2016-10-08 DIAGNOSIS — I5032 Chronic diastolic (congestive) heart failure: Secondary | ICD-10-CM | POA: Diagnosis not present

## 2016-10-08 DIAGNOSIS — F0281 Dementia in other diseases classified elsewhere with behavioral disturbance: Secondary | ICD-10-CM | POA: Diagnosis not present

## 2016-10-08 DIAGNOSIS — N184 Chronic kidney disease, stage 4 (severe): Secondary | ICD-10-CM | POA: Diagnosis not present

## 2016-10-08 DIAGNOSIS — M6281 Muscle weakness (generalized): Secondary | ICD-10-CM | POA: Diagnosis not present

## 2016-10-08 DIAGNOSIS — G309 Alzheimer's disease, unspecified: Secondary | ICD-10-CM | POA: Diagnosis not present

## 2016-10-08 DIAGNOSIS — R260 Ataxic gait: Secondary | ICD-10-CM | POA: Diagnosis not present

## 2016-10-08 DIAGNOSIS — Z9181 History of falling: Secondary | ICD-10-CM | POA: Diagnosis not present

## 2016-10-08 DIAGNOSIS — I130 Hypertensive heart and chronic kidney disease with heart failure and stage 1 through stage 4 chronic kidney disease, or unspecified chronic kidney disease: Secondary | ICD-10-CM | POA: Diagnosis not present

## 2016-10-08 DIAGNOSIS — R001 Bradycardia, unspecified: Secondary | ICD-10-CM | POA: Diagnosis not present

## 2016-10-08 DIAGNOSIS — E1122 Type 2 diabetes mellitus with diabetic chronic kidney disease: Secondary | ICD-10-CM | POA: Diagnosis not present

## 2016-10-10 ENCOUNTER — Telehealth: Payer: Self-pay | Admitting: Internal Medicine

## 2016-10-10 NOTE — Telephone Encounter (Signed)
Returned call to [facility]-reports they are asking for Dr. Debara Pickett to sign for continuation of PT orders.  Advised patient has not been seen since 2015. Therefore would defer to PCP.

## 2016-10-10 NOTE — Telephone Encounter (Signed)
Please call Peter,he needs to give you an update on patient.

## 2016-10-11 ENCOUNTER — Encounter: Payer: Self-pay | Admitting: Sports Medicine

## 2016-10-11 ENCOUNTER — Ambulatory Visit (INDEPENDENT_AMBULATORY_CARE_PROVIDER_SITE_OTHER): Payer: Medicare Other | Admitting: Sports Medicine

## 2016-10-11 DIAGNOSIS — M79676 Pain in unspecified toe(s): Secondary | ICD-10-CM | POA: Diagnosis not present

## 2016-10-11 DIAGNOSIS — B351 Tinea unguium: Secondary | ICD-10-CM

## 2016-10-11 DIAGNOSIS — E119 Type 2 diabetes mellitus without complications: Secondary | ICD-10-CM

## 2016-10-11 NOTE — Progress Notes (Signed)
Patient ID: Gregory Hatfield, male   DOB: September 16, 1925, 83 y.o.   MRN: [id number]  Subjective: Gregory Hatfield is a 83 y.o. male patient with history of type 2 diabetes who presents to office today complaining of long, painful nails while ambulating in shoes; unable to trim. Patient states that the glucose reading is doing better was just discharged from hospital for his heart on Friday. Patient denies any other new problems. Patient denies any new cramping, numbness, burning or tingling in the legs.  Patient Active Problem List   Diagnosis Date Noted  . Symptomatic bradycardia 10/06/2016  . Diabetes mellitus with complication ([place]) 62/95/2841  . AKI (acute kidney injury) ([place]) 10/06/2016  . Chronic diastolic CHF (congestive heart failure) ([place]) 10/06/2016  . Bradycardia   . Hyperlipidemia 01/12/2016  . Prostate cancer ([place]) 10/12/2015  . Diabetic retinopathy ([place]) 10/12/2015  . Encounter for Medicare annual wellness exam 10/12/2015  . Open-angle glaucoma 07/06/2015  . Vitamin D deficiency 06/03/2015  . Medication management 06/03/2015  . CKD stage 3 due to type 2 diabetes mellitus ([place]) 06/03/2015  . Insulin-requiring or dependent type II diabetes mellitus ([place]) 05/05/2015  . Essential hypertension 05/05/2015  . SDAT (senile dementia of Alzheimer's type) 06/16/2013   Current Outpatient Prescriptions on File Prior to Visit  Medication Sig Dispense Refill  . aspirin 81 MG tablet Take 81 mg by mouth daily.    . carvedilol (COREG) 6.25 MG tablet Take 1 tablet (6.25 mg total) by mouth 2 (two) times daily with a meal. 60 tablet 0  . Cholecalciferol (VITAMIN D3) 5000 UNITS CAPS Take 5,000 Units by mouth 2 (two) times daily.     Marland Kitchen dextromethorphan-guaiFENesin (MUCINEX DM) 30-600 MG 12hr tablet Take 1 tablet by mouth 2 (two) times daily.    Marland Kitchen diltiazem (CARDIZEM CD) 120 MG 24 hr capsule Take 1 capsule (120 mg total) by mouth daily. 30 capsule 0  . furosemide (LASIX) 40 MG tablet TAKE 1 OR 2  TABLETS ONCE DAILY FOR BLOOD PRESSURE AND ANKLE SWELLING. 360 tablet 1  . hydrALAZINE (APRESOLINE) 25 MG tablet TAKE ONE TABLET 3 TIMES A DAY FOR BLOOD PRESSURE. 270 tablet 1  . hyoscyamine (LEVSIN/SL) 0.125 MG SL tablet Place 1 tablet (0.125 mg total) under the tongue every 4 (four) hours as needed. (Patient not taking: Reported on 10/06/2016) 30 tablet 0  . Insulin Detemir (LEVEMIR FLEXTOUCH) 100 UNIT/ML Pen Inject 50 Units into the skin every morning. And pen needles 1/day 30 mL 11  . LUMIGAN 0.01 % SOLN Place 1 drop into the right eye at bedtime.     . Multiple Vitamin (MULTIVITAMIN) tablet Take 1 tablet by mouth daily.      Marland Kitchen MYRBETRIQ 50 MG TB24 tablet Take 50 mg by mouth daily.     . pravastatin (PRAVACHOL) 40 MG tablet TAKE 1 TABLET ONCE DAILY FOR CHOLESTEROL. 30 tablet 0  . Probiotic Product (PROBIOTIC DAILY PO) Take 1 tablet by mouth daily.     Marland Kitchen senna-docusate (SENOKOT-S) 8.6-50 MG tablet Take 1 tablet by mouth at bedtime. (Patient taking differently: Take 1 tablet by mouth daily. )    . SIMBRINZA 1-0.2 % SUSP Place 1 drop into both eyes 3 (three) times daily.     No current facility-administered medications on file prior to visit.    Allergies  Allergen Reactions  . Ace Inhibitors Other (See Comments) and Cough    Chronic cough  . Demerol Other (See Comments)    Hard to wake up  .  Cosopt [Dorzolamide Hcl-Timolol Mal] Other (See Comments)    Cause heart rate to drop  . Flomax [Tamsulosin Hcl] Other (See Comments)    Low BP  . Morphine And Related Other (See Comments)    hallucinations  . Oxytrol [Oxybutynin] Rash    Objective: General: Patient is awake, alert, and oriented x 3 and in no acute distress.  Integument: Skin is warm, dry and supple bilateral. Nails are tender, long, thickened and dystrophic with subungual debris, consistent with onychomycosis, 1-5 bilateral. No signs of infection. No open lesions or preulcerative lesions present bilateral. Remaining integument  unremarkable.  Vasculature:  Dorsalis Pedis pulse 1/4 bilateral. Posterior Tibial pulse  0/4 bilateral. No ischemia or gangrene. Capillary fill time <3 sec 1-5 bilateral. Positive hair growth to the level of the digits.Temperature gradient within normal limits. No varicosities present bilateral. Trace edema present bilateral.   Neurology: The patient has intact sensation measured with a 5.07/10g Semmes Weinstein Monofilament at all pedal sites bilateral . Vibratory sensation diminished bilateral with tuning fork. No Babinski sign present bilateral.   Musculoskeletal: No symptomatic pedal deformities noted bilateral. Muscular strength 5/5 in all lower extremity muscular groups bilateral without pain or limitation on range of motion. No tenderness with calf compression bilateral.  Assessment and Plan: Problem List Items Addressed This Visit    None    Visit Diagnoses    Pain due to onychomycosis of toenail    -  Primary   Diabetes mellitus without complication ([place])         -Examined patient. -Discussed and educated patient on diabetic foot care, especially with  regards to the vascular, neurological and musculoskeletal systems.  -Stressed the importance of good glycemic control and the detriment of not  controlling glucose levels in relation to the foot. -Mechanically debrided all nails 1-5 bilateral using sterile nail nipper and filed with dremel without incident  -Answered all patient questions -Patient to return in 3 months for at risk foot care -Patient advised to call the office if any problems or questions arise in the meantime.  Landis Martins, DPM

## 2016-10-18 NOTE — Progress Notes (Signed)
Patient ID: Gregory Hatfield, male   DOB: 1926-02-10, 83 y.o.   MRN: [id number]  MEDICARE ANNUAL WELLNESS VISIT AND OV  Assessment:   Essential hypertension - continue medications, DASH diet, exercise and monitor at home. Call if greater than 130/80.   CKD stage 3 due to type 2 diabetes mellitus ([place]) Discussed general issues about diabetes pathophysiology and management., Educational material distributed., Suggested low cholesterol diet., Encouraged aerobic exercise., Discussed foot care., Reminded to get yearly retinal exam. - BASIC METABOLIC PANEL WITH GFR   Dyslipidemia associated with type 2 diabetes mellitus ([place]) -continue medications, check lipids, decrease fatty foods, increase activity.    Vitamin D deficiency   Medication management continue follow up  Open-angle glaucoma, unspecified glaucoma stage, unspecified laterality, unspecified open-angle glaucoma type  SDAT (senile dementia of Alzheimer's type) Will be going to assisted living, continue to monitor, needs aid with medications/food, etc.    Prostate cancer ([place]) Continue follow up  Proliferative diabetic retinopathy associated with type 2 diabetes mellitus, macular edema presence unspecified (Gregory Hatfield) continue follow up  Encounter for Medicare annual wellness exam  Chronic diastolic CHF (congestive heart failure) (Gregory Hatfield) Monitor weight  Insulin-requiring or dependent type II diabetes mellitus (Gregory Hatfield) Discussed general issues about diabetes pathophysiology and management., Educational material distributed., Suggested low cholesterol diet., Encouraged aerobic exercise., Discussed foot care., Reminded to get yearly retinal exam. Goal is around 8 for A1C, monitor closely  Diabetic retinopathy without macular edema associated with diabetes mellitus due to underlying condition, unspecified laterality, unspecified retinopathy severity (Gregory Hatfield) Discussed general issues about diabetes pathophysiology and management.,  Educational material distributed., Suggested low cholesterol diet., Encouraged aerobic exercise., Discussed foot care., Reminded to get yearly retinal exam.  Symptomatic bradycardia Improved with decrease in meds, continue to keep good record  Bilateral hearing loss due to cerumen impaction Cerumen impaction - stop using Qtips, irrigation used in the office without complications, use OTC drops/oil at home to prevent reoccurence    Plan:   During the course of the visit the patient was educated and counseled about appropriate screening and preventive services including:    Pneumococcal vaccine   Influenza vaccine  Td vaccine  Screening electrocardiogram  Bone densitometry screening  Colorectal cancer screening  Diabetes screening  Glaucoma screening  Nutrition counseling   Advanced directives: requested  Subjective:  Gregory Hatfield  presents for Medicare Annual Wellness Visit and OV.    He has had elevated blood pressure since 1992. His blood pressure has been controlled at home, BP: 130/68 He does not workout. He denies chest pain, shortness of breath, dizziness.  Had recent hospitlization for bradycardia, He is on cardizem to 120, and coreg to 6.25mg .  Daughter is here [place] in [place], [place] daughter at [employer], granddaughter lives with him, she is Biomedical engineer, he is doing PT 2 days a week, someone else doing his pill boxes, and lasix is 40mg  daily.  He is on cholesterol medication and denies myalgias. His cholesterol is at goal. The cholesterol last visit was:  Lab Results  Component Value Date   CHOL 126 07/18/2016   HDL 42 07/18/2016   LDLCALC 67 07/18/2016   TRIG 86 07/18/2016   CHOLHDL 3.0 07/18/2016   He has had diabetes for 43 years since 1973 - initially treated with oral agents but started on Insulin in 1997. Currently he's followed by Dr Loanne Drilling who has innovated a protocol of loose control given the patient's limited cognitive capacity and insight in  comprehending a vigorous regimen, on insulin,  RECENTLY INCREASED TO  50 units .  He has been working on diet and exercise and denies foot ulcerations, hyperglycemia, hypoglycemia , nausea, paresthesia of the feet, polydipsia, polyuria and visual disturbances. Last A1C in the office was:  Lab Results  Component Value Date   HGBA1C 9.7 (H) 10/06/2016   Patient is on Vitamin D supplement.   Lab Results  Component Value Date   VD25OH 105 (H) 07/18/2016     Patient has GERD which is controlled with diet and IBS for which he occasionally takes hyoscyamine. Other problems include Depression, controlled or in remission and lastly hx/o of low grade Prostate Cancer monitored by Dr Gaynelle Arabian.  Patient has SDAT and is no longer able to live independently due to medical noncompliance and meals, but still at home, walking with a walking stick for balance, and grand daughter stays with him all the time. [name] brother is here with him.  Lab Results  Component Value Date   GFRAA 33 (L) 10/07/2016    Names of Other Physician/Practitioners you currently use: 1. [facility] here for primary care 2. Dr Lanell Matar, eye doctor, last visit Dec 2018 for quarterly Glaucoma monitoring 3. Dr's Loistine Simas, DDS, dentist, last visit - 1 week ago  Patient Care Team: No Pcp Per Patient as PCP - General (General Practice) Renato Shin, MD as Consulting Physician (Endocrinology) Diane Dalphine Handing (Optometry) Earlie Server, MD as Consulting Physician (Orthopedic Surgery) Carolan Clines, MD as Consulting Physician (Urology) Laurence Spates, MD as Consulting Physician (Gastroenterology) Fleet Contras, MD as Consulting Physician (Nephrology) Ninetta Lights, MD as Consulting Physician (Orthopedic Surgery) Pixie Casino, MD as Consulting Physician (Cardiology) Teena Irani, MD as Consulting Physician (Gastroenterology)  Medication Review: Current Outpatient  Prescriptions on File Prior to Visit  Medication Sig Dispense Refill  . aspirin 81 MG tablet Take 81 mg by mouth daily.    . carvedilol (COREG) 6.25 MG tablet Take 1 tablet (6.25 mg total) by mouth 2 (two) times daily with a meal. 60 tablet 0  . Cholecalciferol (VITAMIN D3) 5000 UNITS CAPS Take 5,000 Units by mouth 2 (two) times daily.     Marland Kitchen dextromethorphan-guaiFENesin (MUCINEX DM) 30-600 MG 12hr tablet Take 1 tablet by mouth 2 (two) times daily.    Marland Kitchen diltiazem (CARDIZEM CD) 120 MG 24 hr capsule Take 1 capsule (120 mg total) by mouth daily. 30 capsule 0  . furosemide (LASIX) 40 MG tablet TAKE 1 OR 2 TABLETS ONCE DAILY FOR BLOOD PRESSURE AND ANKLE SWELLING. 360 tablet 1  . hydrALAZINE (APRESOLINE) 25 MG tablet TAKE ONE TABLET 3 TIMES A DAY FOR BLOOD PRESSURE. 270 tablet 1  . hyoscyamine (LEVSIN/SL) 0.125 MG SL tablet Place 1 tablet (0.125 mg total) under the tongue every 4 (four) hours as needed. 30 tablet 0  . Insulin Detemir (LEVEMIR FLEXTOUCH) 100 UNIT/ML Pen Inject 50 Units into the skin every morning. And pen needles 1/day 30 mL 11  . LUMIGAN 0.01 % SOLN Place 1 drop into the right eye at bedtime.     . Multiple Vitamin (MULTIVITAMIN) tablet Take 1 tablet by mouth daily.      Marland Kitchen MYRBETRIQ 50 MG TB24 tablet Take 50 mg by mouth daily.     . pravastatin (PRAVACHOL) 40 MG tablet TAKE 1 TABLET ONCE DAILY FOR CHOLESTEROL. 30 tablet 0  . Probiotic Product (PROBIOTIC DAILY PO) Take 1 tablet by mouth daily.     Marland Kitchen senna-docusate (SENOKOT-S) 8.6-50 MG tablet Take 1 tablet  by mouth at bedtime. (Patient taking differently: Take 1 tablet by mouth daily. )    . SIMBRINZA 1-0.2 % SUSP Place 1 drop into both eyes 3 (three) times daily.     No current facility-administered medications on file prior to visit.     Current Problems (verified) Patient Active Problem List   Diagnosis Date Noted  . Symptomatic bradycardia 10/06/2016  . Diabetes mellitus with complication ([place]) 32/95/1884  . AKI (acute kidney  injury) ([place]) 10/06/2016  . Chronic diastolic CHF (congestive heart failure) ([place]) 10/06/2016  . Hyperlipidemia 01/12/2016  . Prostate cancer ([place]) 10/12/2015  . Diabetic retinopathy ([place]) 10/12/2015  . Encounter for Medicare annual wellness exam 10/12/2015  . Open-angle glaucoma 07/06/2015  . Vitamin D deficiency 06/03/2015  . Medication management 06/03/2015  . CKD stage 3 due to type 2 diabetes mellitus ([place]) 06/03/2015  . Insulin-requiring or dependent type II diabetes mellitus ([place]) 05/05/2015  . Essential hypertension 05/05/2015  . SDAT (senile dementia of Alzheimer's type) 06/16/2013   Screening Tests Immunization History  Administered Date(s) Administered  . DTaP 09/28/2006  . Influenza Whole 04/05/2010, 04/04/2013  . Influenza, High Dose Seasonal PF 04/02/2014, 03/30/2015  . Influenza-Unspecified 02/19/2011  . Pneumococcal Conjugate-13 06/10/2014  . Pneumococcal Polysaccharide-23 07/21/2001, 03/20/2008  . Td 08/28/2008   Preventative care: Last colonoscopy: 2010- recc 10 yr f/u  Echo 04/2015 55-60% CT head 04/2015 Renal US 2007  Influenza 03/2015 Prenvar 13 2015 Pneumonia 2009 Tetantus 2010 Zoster- declines  History reviewed: allergies, current medications, past family history, past medical history, past social history, past surgical history and problem list  Allergies Allergies  Allergen Reactions  . Ace Inhibitors Other (See Comments) and Cough    Chronic cough  . Demerol Other (See Comments)    Hard to wake up  . Cosopt [Dorzolamide Hcl-Timolol Mal] Other (See Comments)    Cause heart rate to drop  . Flomax [Tamsulosin Hcl] Other (See Comments)    Low BP  . Morphine And Related Other (See Comments)    hallucinations  . Oxytrol [Oxybutynin] Rash    SURGICAL HISTORY He  has a past surgical history that includes Cardiac catheterization (04/2004); Prostate surgery (10/2010); transthoracic echocardiogram (05/2006); Carpal tunnel release (Right); Lumbar  spine surgery (2003); Cervical spine surgery (1990's); Total hip arthroplasty (Left, 01/17/2014); and Transurethral resection of prostate (N/A, 08/22/2014). FAMILY HISTORY His family history includes Cancer in his brother and child; Diabetes in his brother, child, mother, and sister; Heart attack in his child; Heart disease in his brother, father, and sister; Hyperlipidemia in his child; Hypertension in his child and mother; Prostate cancer in his father. SOCIAL HISTORY He  reports that he quit smoking about 33 years ago. He has never used smokeless tobacco. He reports that he does not drink alcohol or use drugs.  MEDICARE WELLNESS OBJECTIVES: Physical activity:   Cardiac risk factors:   Depression/mood screen:   Depression screen [facility] 2/9 07/23/2016  Decreased Interest 0  Down, Depressed, Hopeless -  PHQ - 2 Score 0    ADLs:  In your present state of health, do you have any difficulty performing the following activities: 10/06/2016 07/23/2016  Hearing? N N  Vision? N N  Difficulty concentrating or making decisions? Y N  Walking or climbing stairs? Y N  Dressing or bathing? N N  Doing errands, shopping? Y N  Preparing Food and eating ? - -  Using the Toilet? - -  In the past six months, have you accidently leaked urine? - -  Do you have problems with loss of bowel control? - -  Managing your Medications? - -  Managing your Finances? - -  Housekeeping or managing your Housekeeping? - -  Some recent data might be hidden     Cognitive Testing  Alert? Yes  Normal Appearance?Yes  Oriented to person? Yes  Place? Yes   Time? Yes  Recall of three objects?  No  Can perform simple calculations? No  Displays appropriate judgment?Yes  Can read the correct time from a watch face? No  EOL planning: Does Patient Have a Medical Advance Directive?: Yes Type of Advance Directive: [place], Living will [place] in Chart?: No - copy  requested   Objective:     BP 130/68   Pulse 67   Temp 97.9 F (36.6 C)   Resp 14   Ht 5' 10.5" (1.791 m)   Wt 203 lb (92.1 kg)   SpO2 97%   BMI 28.72 kg/m   General Appearance:  Alert  WD/WN, male , in no apparent distress. Eyes: PERRLA, EOMs, conjunctiva no swelling or erythema, normal fundi and vessels. Sinuses: No frontal/maxillary tenderness ENT/Mouth: EACs patent / TMs  nl. Nares clear without erythema, swelling, mucoid exudates. Oral hygiene is good. No erythema, swelling, or exudate. Tongue normal, non-obstructing. Tonsils not swollen or erythematous. Hearing decreased Neck: Supple, thyroid normal. No bruits, nodes or JVD. Respiratory: Respiratory effort normal.  BS equal and clear bilateral without rales, rhonci, wheezing or stridor. Cardio: Heart sounds are normal with regular rate and rhythm and no murmurs, rubs or gallops. Peripheral pulses are normal and equal bilaterally without edema. No aortic or femoral bruits. Chest: symmetric with normal excursions and percussion.  Abdomen: Flat, soft, with nl bowel sounds. Nontender, no guarding, rebound, hernias, masses, or organomegaly.  Lymphatics: Non tender without lymphadenopathy.  Musculoskeletal: Full ROM all peripheral extremities, joint stability, 4/5 strength, and antaglic gait with cane.  Skin: Warm and dry without rashes, lesions, cyanosis, clubbing or  ecchymosis.  Neuro: Cranial nerves intact, reflexes equal bilaterally. Normal muscle tone, no cerebellar symptoms. Sensation intact.  Pysch: Awake and oriented X 3 with normal affect, insight and judgment appropriate.    Medicare Attestation I have personally reviewed: The patient's medical and social history Their use of alcohol, tobacco or illicit drugs Their current medications and supplements The patient's functional ability including ADLs,fall risks, home safety risks, cognitive, and hearing and visual impairment Diet and physical activities Evidence for  depression or mood disorders  The patient's weight, height, BMI, and visual acuity have been recorded in the chart.  I have made referrals, counseling, and provided education to the patient based on review of the above and I have provided the patient with a written personalized care plan for preventive services.  Over 40 minutes of exam, counseling, chart review was performed.   Vicie Mutters, PA-C   10/19/2016

## 2016-10-19 ENCOUNTER — Ambulatory Visit (INDEPENDENT_AMBULATORY_CARE_PROVIDER_SITE_OTHER): Payer: Medicare Other | Admitting: Physician Assistant

## 2016-10-19 ENCOUNTER — Encounter: Payer: Self-pay | Admitting: Physician Assistant

## 2016-10-19 VITALS — BP 130/68 | HR 67 | Temp 97.9°F | Resp 14 | Ht 70.5 in | Wt 203.0 lb

## 2016-10-19 DIAGNOSIS — Z0001 Encounter for general adult medical examination with abnormal findings: Secondary | ICD-10-CM

## 2016-10-19 DIAGNOSIS — H9203 Otalgia, bilateral: Secondary | ICD-10-CM

## 2016-10-19 DIAGNOSIS — R6889 Other general symptoms and signs: Secondary | ICD-10-CM | POA: Diagnosis not present

## 2016-10-19 DIAGNOSIS — E08319 Diabetes mellitus due to underlying condition with unspecified diabetic retinopathy without macular edema: Secondary | ICD-10-CM

## 2016-10-19 DIAGNOSIS — F0280 Dementia in other diseases classified elsewhere without behavioral disturbance: Secondary | ICD-10-CM

## 2016-10-19 DIAGNOSIS — N179 Acute kidney failure, unspecified: Secondary | ICD-10-CM

## 2016-10-19 DIAGNOSIS — C61 Malignant neoplasm of prostate: Secondary | ICD-10-CM

## 2016-10-19 DIAGNOSIS — Z79899 Other long term (current) drug therapy: Secondary | ICD-10-CM

## 2016-10-19 DIAGNOSIS — E118 Type 2 diabetes mellitus with unspecified complications: Secondary | ICD-10-CM

## 2016-10-19 DIAGNOSIS — H6123 Impacted cerumen, bilateral: Secondary | ICD-10-CM

## 2016-10-19 DIAGNOSIS — N183 Chronic kidney disease, stage 3 (moderate): Secondary | ICD-10-CM

## 2016-10-19 DIAGNOSIS — G301 Alzheimer's disease with late onset: Secondary | ICD-10-CM

## 2016-10-19 DIAGNOSIS — H4010X0 Unspecified open-angle glaucoma, stage unspecified: Secondary | ICD-10-CM

## 2016-10-19 DIAGNOSIS — Z794 Long term (current) use of insulin: Secondary | ICD-10-CM

## 2016-10-19 DIAGNOSIS — E1122 Type 2 diabetes mellitus with diabetic chronic kidney disease: Secondary | ICD-10-CM

## 2016-10-19 DIAGNOSIS — Z0000 Encounter for general adult medical examination without abnormal findings: Secondary | ICD-10-CM

## 2016-10-19 DIAGNOSIS — I5032 Chronic diastolic (congestive) heart failure: Secondary | ICD-10-CM

## 2016-10-19 DIAGNOSIS — E559 Vitamin D deficiency, unspecified: Secondary | ICD-10-CM

## 2016-10-19 DIAGNOSIS — I10 Essential (primary) hypertension: Secondary | ICD-10-CM

## 2016-10-19 DIAGNOSIS — E119 Type 2 diabetes mellitus without complications: Secondary | ICD-10-CM

## 2016-10-19 DIAGNOSIS — E782 Mixed hyperlipidemia: Secondary | ICD-10-CM

## 2016-10-19 DIAGNOSIS — R001 Bradycardia, unspecified: Secondary | ICD-10-CM

## 2016-10-19 LAB — CBC WITH DIFFERENTIAL/PLATELET
BASOS PCT: 1 %
Basophils Absolute: 77 cells/uL (ref 0–200)
Eosinophils Absolute: 154 cells/uL (ref 15–500)
Eosinophils Relative: 2 %
HCT: 38.6 % (ref 38.5–50.0)
Hemoglobin: 12.3 g/dL — ABNORMAL LOW (ref 13.2–17.1)
LYMPHS ABS: 2849 {cells}/uL (ref 850–3900)
Lymphocytes Relative: 37 %
MCH: 28.9 pg (ref 27.0–33.0)
MCHC: 31.9 g/dL — ABNORMAL LOW (ref 32.0–36.0)
MCV: 90.6 fL (ref 80.0–100.0)
MPV: 12.4 fL (ref 7.5–12.5)
Monocytes Absolute: 462 cells/uL (ref 200–950)
Monocytes Relative: 6 %
Neutro Abs: 4158 cells/uL (ref 1500–7800)
Neutrophils Relative %: 54 %
Platelets: 169 10*3/uL (ref 140–400)
RBC: 4.26 MIL/uL (ref 4.20–5.80)
RDW: 13.8 % (ref 11.0–15.0)
WBC: 7.7 10*3/uL (ref 3.8–10.8)

## 2016-10-19 LAB — TSH: TSH: 1.21 m[IU]/L (ref 0.40–4.50)

## 2016-10-19 NOTE — Patient Instructions (Signed)
Potassium Content of Foods  The body needs potassium to control blood pressure and to keep the muscles and nervous system healthy. Here are some healthy foods below that are high in potassium. Also you can get the white label salt of "NO SALT" salt substitute, 1/4 teaspoon of this is equivalent to 102meq potassium.   FOODS AND DRINKS HIGH IN POTASSIUM FOODS MODERATE IN POTASSIUM   Fruits  Avocado (cubed),  c / 50 g.  Banana (sliced), 75 g.  Cantaloupe (cubed), 80 g.  Honeydew, 1 wedge / 85 g.  Kiwi (sliced), 90 g.  Nectarine, 1 small / 129 g.  Orange, 1 medium / 131 g. Vegetables  Artichoke,  of a medium / 64 g.  Asparagus (boiled), 90 g..  Broccoli (boiled), 78 g.  Brussels sprout (boiled), 78 g.  Butternut squash (baked), 103 g.  Chickpea (cooked), 82 g.  Green peas (cooked), 80 g.  Kidney beans (cooked), 5 tbsp / 55 g.  Lima beans (cooked),  c / 43 g.  Navy beans (cooked),  c / 61 g.  Spinach (cooked),  c / 45 g.  Sweet potato (baked),  c / 50 g.  Tomato (chopped or sliced), 90 g.  Vegetable juice.  White mushrooms (cooked), 78 g.  Yam (cooked or baked),  c / 34 g.  Zucchini squash (boiled), 90 g. Other Foods and Drinks  Almonds (whole),  c / 36 g.  Fish, 3 oz / 85 g.  Nonfat fruit variety yogurt, 123 g.  Pistachio nuts, 1 oz / 28 g.  Pumpkin seeds, 1 oz / 28 g.  Red meat (broiled, cooked, grilled), 3 oz / 85 g.  Scallops (steamed), 3 oz / 85 g.  Spaghetti sauce,  c / 66 g.  Sunflower seeds (dry roasted), 1 oz / 28 g.  Veggie burger, 1 patty / 70 g. Fruits  Grapefruit,  of the fruit / 329 g  Plums (sliced), 83 g.  Tangerine, 1 large / 120 g. Vegetables  Carrots (boiled), 78 g.  Carrots (sliced), 61 g.  Rhubarb (cooked with sugar), 120 g.  Rutabaga (cooked), 120 g.  Yellow snap beans (cooked), 63 g. Other Foods and Drinks   Chicken breast (roasted and chopped),  c / 70 g.  Pita bread, 1 large / 64 g.  Shrimp  (steamed), 4 oz / 113 g.  Swiss cheese (diced), 70 g.        Bad carbs also include fruit juice, alcohol, and sweet tea. These are empty calories that do not signal to your brain that you are full.   Please remember the good carbs are still carbs which convert into sugar. So please measure them out no more than 1/2-1 cup of rice, oatmeal, pasta, and beans  Veggies are however free foods! Pile them on.   Not all fruit is created equal. Please see the list below, the fruit at the bottom is higher in sugars than the fruit at the top. Please avoid all dried fruits.

## 2016-10-20 LAB — LIPID PANEL
CHOL/HDL RATIO: 3.3 ratio (ref <5.0)
Cholesterol: 144 mg/dL (ref <200)
HDL: 43 mg/dL (ref >40)
LDL CALC: 68 mg/dL (ref <100)
Triglycerides: 167 mg/dL — ABNORMAL HIGH (ref <150)
VLDL: 33 mg/dL — AB (ref <30)

## 2016-10-20 LAB — HEPATIC FUNCTION PANEL
ALK PHOS: 68 U/L (ref 40–115)
ALT: 17 U/L (ref 9–46)
AST: 17 U/L (ref 10–35)
Albumin: 3.9 g/dL (ref 3.6–5.1)
BILIRUBIN DIRECT: 0.1 mg/dL (ref <=0.2)
BILIRUBIN INDIRECT: 0.3 mg/dL (ref 0.2–1.2)
BILIRUBIN TOTAL: 0.4 mg/dL (ref 0.2–1.2)
Total Protein: 7 g/dL (ref 6.1–8.1)

## 2016-10-20 LAB — BASIC METABOLIC PANEL WITH GFR
BUN: 29 mg/dL — ABNORMAL HIGH (ref 7–25)
CO2: 26 mmol/L (ref 20–31)
Calcium: 9.3 mg/dL (ref 8.6–10.3)
Chloride: 100 mmol/L (ref 98–110)
Creat: 2.08 mg/dL — ABNORMAL HIGH (ref 0.70–1.11)
GFR, EST AFRICAN AMERICAN: 32 mL/min — AB (ref >=60)
GFR, EST NON AFRICAN AMERICAN: 28 mL/min — AB (ref >=60)
GLUCOSE: 275 mg/dL — AB (ref 65–99)
POTASSIUM: 4.3 mmol/L (ref 3.5–5.3)
SODIUM: 137 mmol/L (ref 135–146)

## 2016-10-20 NOTE — Progress Notes (Signed)
Pt aware of lab results & voiced understanding of those results.

## 2016-10-20 NOTE — Progress Notes (Signed)
LVM for pt to return office call for LAB results.

## 2016-10-27 ENCOUNTER — Other Ambulatory Visit: Payer: Self-pay | Admitting: Physician Assistant

## 2016-10-28 ENCOUNTER — Ambulatory Visit (INDEPENDENT_AMBULATORY_CARE_PROVIDER_SITE_OTHER): Payer: Medicare Other | Admitting: Endocrinology

## 2016-10-28 ENCOUNTER — Encounter: Payer: Self-pay | Admitting: Endocrinology

## 2016-10-28 VITALS — BP 128/68 | HR 69 | Ht 70.5 in | Wt 205.0 lb

## 2016-10-28 DIAGNOSIS — E118 Type 2 diabetes mellitus with unspecified complications: Secondary | ICD-10-CM | POA: Diagnosis not present

## 2016-10-28 MED ORDER — INSULIN DETEMIR 100 UNIT/ML FLEXPEN: 55.0000 [IU] | PEN_INJECTOR | SUBCUTANEOUS | 11 refills | Status: DC

## 2016-10-28 NOTE — Patient Instructions (Addendum)
check your blood sugar 6 times a day--before the 3 meals, and at bedtime.  also check if you have symptoms of your blood sugar being too high or too low.  please keep a record of the readings and bring it to your next appointment here.  please call us sooner if you are having low blood sugar episodes.   On this type of insulin schedule, you should eat meals on a regular schedule.  If a meal is missed or significantly delayed, your blood sugar could go low.  Please increase the levemir to 55 units each morning.  Please come back for a follow-up appointment in 3 months.

## 2016-10-28 NOTE — Progress Notes (Signed)
Subjective:    Patient ID: Gregory Hatfield, male    DOB: [birth year], 83 y.o.   MRN: [id number]  HPI Pt returns for f/u of diabetes mellitus: DM type: Insulin-requiring type 2 Dx'ed: 1856 Complications: renal insuff, retinopathy, CAD, and CHF.   Therapy: insulin since 2003 DKA: never Severe hypoglycemia: never.   Pancreatitis: never. Other: he is on a QD insulin regimen, due to noncompliance with multiple daily injections.   Interval history: He brings a record of his cbg's which I have reviewed today.  It varies from 160-415. There is no trend throughout the day, but it is lowest after he misses a meal.  pt states he feels better in general, since recent hosp stay. Pt says he never misses the insulin.   Past Medical History:  Diagnosis Date  . Bradycardia 10/06/2016  . CHF (congestive heart failure) ([place])   . CKD stage 4 due to type 2 diabetes mellitus ([place]) 06/03/2015  . Dyslipidemia associated with type 2 diabetes mellitus ([place]) 05/05/2015  . Essential hypertension 05/05/2015  . IDDM (insulin dependent diabetes mellitus) ([place])   . SDAT (senile dementia of Alzheimer's type) 06/16/2013  . Uncontrolled insulin-dependent diabetes mellitus with renal manifestation ([place]) 05/05/2015    Past Surgical History:  Procedure Laterality Date  . CARDIAC CATHETERIZATION  04/2004   normal r-sided pressure, mild pulm HTN  . CARPAL TUNNEL RELEASE Right   . CERVICAL SPINE SURGERY  1990's  . LUMBAR SPINE SURGERY  2003  . PROSTATE SURGERY  10/2010   transurethral resection   . TOTAL HIP ARTHROPLASTY Left 01/17/2014   Procedure: LEFT TOTAL HIP ARTHROPLASTY;  Surgeon: Yvette Rack., MD;  Location: [facility];  Service: Orthopedics;  Laterality: Left;  . TRANSTHORACIC ECHOCARDIOGRAM  05/2006   EF 60-70%; mild calcif of MV; mild MV regurg; LA mildly dilated  . TRANSURETHRAL RESECTION OF PROSTATE N/A 08/22/2014   Procedure: TRANSURETHRAL RESECTION OF THE PROSTATE (TURP);  Surgeon: Marcia Brash, MD;   Location: [facility];  Service: Urology;  Laterality: N/A;    Social History   Social History  . Marital status: Widowed    Spouse name: N/A  . Number of children: 5  . Years of education: master's   Occupational History  . teacher/instructor Retired   Social History Main Topics  . Smoking status: Former Smoker    Quit date: 06/21/1983  . Smokeless tobacco: Never Used  . Alcohol use No  . Drug use: No  . Sexual activity: No   Other Topics Concern  . Not on file   Social History Narrative  . No narrative on file    Current Outpatient Prescriptions on File Prior to Visit  Medication Sig Dispense Refill  . aspirin 81 MG tablet Take 81 mg by mouth daily.    . carvedilol (COREG) 6.25 MG tablet Take 1 tablet (6.25 mg total) by mouth 2 (two) times daily with a meal. 60 tablet 0  . Cholecalciferol (VITAMIN D3) 5000 UNITS CAPS Take 5,000 Units by mouth 2 (two) times daily.     Marland Kitchen dextromethorphan-guaiFENesin (MUCINEX DM) 30-600 MG 12hr tablet Take 1 tablet by mouth 2 (two) times daily.    Marland Kitchen diltiazem (CARDIZEM CD) 120 MG 24 hr capsule Take 1 capsule (120 mg total) by mouth daily. 30 capsule 0  . furosemide (LASIX) 40 MG tablet TAKE 1 OR 2 TABLETS ONCE DAILY FOR BLOOD PRESSURE AND ANKLE SWELLING. 360 tablet 1  . hydrALAZINE (APRESOLINE) 25 MG tablet TAKE ONE TABLET 3  TIMES A DAY FOR BLOOD PRESSURE. 270 tablet 1  . hyoscyamine (LEVSIN/SL) 0.125 MG SL tablet Place 1 tablet (0.125 mg total) under the tongue every 4 (four) hours as needed. 30 tablet 0  . LUMIGAN 0.01 % SOLN Place 1 drop into the right eye at bedtime.     . Multiple Vitamin (MULTIVITAMIN) tablet Take 1 tablet by mouth daily.      Marland Kitchen MYRBETRIQ 50 MG TB24 tablet Take 50 mg by mouth daily.     . pravastatin (PRAVACHOL) 40 MG tablet TAKE 1 TABLET ONCE DAILY FOR CHOLESTEROL. 30 tablet 1  . Probiotic Product (PROBIOTIC DAILY PO) Take 1 tablet by mouth daily.     Marland Kitchen senna-docusate (SENOKOT-S) 8.6-50 MG tablet Take 1 tablet by mouth at  bedtime. (Patient taking differently: Take 1 tablet by mouth daily. )    . SIMBRINZA 1-0.2 % SUSP Place 1 drop into both eyes 3 (three) times daily.     No current facility-administered medications on file prior to visit.     Allergies  Allergen Reactions  . Ace Inhibitors Other (See Comments) and Cough    Chronic cough  . Demerol Other (See Comments)    Hard to wake up  . Cosopt [Dorzolamide Hcl-Timolol Mal] Other (See Comments)    Cause heart rate to drop  . Flomax [Tamsulosin Hcl] Other (See Comments)    Low BP  . Morphine And Related Other (See Comments)    hallucinations  . Oxytrol [Oxybutynin] Rash    Family History  Problem Relation Age of Onset  . Diabetes Mother   . Hypertension Mother   . Prostate cancer Father   . Heart disease Father   . Diabetes Brother   . Cancer Brother   . Heart disease Brother   . Diabetes Sister   . Heart disease Sister   . Hyperlipidemia Child   . Hypertension Child   . Diabetes Child   . Heart attack Child   . Cancer Child     BP 128/68 (BP Location: Left Arm, Patient Position: Sitting, Cuff Size: Normal)   Pulse 69   Ht 5' 10.5" (1.791 m)   Wt 205 lb (93 kg)   SpO2 97%   BMI 29.00 kg/m   Review of Systems He denies hypoglycemia.      Objective:   Physical Exam VITAL SIGNS:  See vs page.  GENERAL: no distress.  Pulses: dorsalis pedis intact bilat.   CV: 1+ bilat leg edema.  Feet: no deformity. There is spotty hyperpigmentation on the legs and feet. There is bilateral onychomycosis.  Skin: feet are of normal temp. no ulcer on the feet.  Neuro: sensation is intact to touch on the feet.   Lab Results  Component Value Date   HGBA1C 9.7 (H) 10/06/2016      Assessment & Plan:  Insulin-requiring type 2 DM, with retinopathy: worse.  Frail elderly state: he is not a candidate for aggressive glycemic control.   Renal insufficiency: this places him at risk for hypoglycemia.    Patient Instructions  check your blood sugar  6 times a day--before the 3 meals, and at bedtime.  also check if you have symptoms of your blood sugar being too high or too low.  please keep a record of the readings and bring it to your next appointment here.  please call us sooner if you are having low blood sugar episodes.   On this type of insulin schedule, you should eat meals on a regular  schedule.  If a meal is missed or significantly delayed, your blood sugar could go low.  Please increase the levemir to 55 units each morning.  Please come back for a follow-up appointment in 3 months.

## 2016-10-31 ENCOUNTER — Encounter: Payer: Self-pay | Admitting: Internal Medicine

## 2016-10-31 ENCOUNTER — Ambulatory Visit (INDEPENDENT_AMBULATORY_CARE_PROVIDER_SITE_OTHER): Payer: Medicare Other | Admitting: Internal Medicine

## 2016-10-31 VITALS — BP 167/69 | HR 63 | Ht 70.5 in | Wt 204.8 lb

## 2016-10-31 DIAGNOSIS — N183 Chronic kidney disease, stage 3 unspecified: Secondary | ICD-10-CM

## 2016-10-31 DIAGNOSIS — R001 Bradycardia, unspecified: Secondary | ICD-10-CM | POA: Diagnosis not present

## 2016-10-31 DIAGNOSIS — I10 Essential (primary) hypertension: Secondary | ICD-10-CM | POA: Diagnosis not present

## 2016-10-31 MED ORDER — AMLODIPINE BESYLATE 5 MG PO TABS: 5.0000 mg | ORAL_TABLET | Freq: Every day | ORAL | 3 refills | Status: DC

## 2016-10-31 NOTE — Patient Instructions (Signed)
Your physician has recommended you make the following change in your medication:  -- STOP diltiazem -- START amlodipine 5mg  once daily  Dr. Debara Pickett has requested that you schedule an appointment with one of our clinical pharmacists for a blood pressure check appointment within the next 3 weeks.  -- if you monitor your blood pressure (BP) at home, please bring your BP cuff and your BP readings with you to this appointment -- please check your BP no more than twice daily, after you have been sitting/resting for 5-10 minutes, at least 1 hour after taking your BP medications  Your physician wants you to follow-up in: ONE YEAR with Dr. Debara Pickett. You will receive a reminder letter in the mail two months in advance. If you don't receive a letter, please call our office to schedule the follow-up appointment.

## 2016-10-31 NOTE — Progress Notes (Addendum)
OFFICE NOTE  Chief Complaint:  Hospital follow-up for bradycardia  Primary Care Physician: Vicie Mutters, PA-C  HPI:  Gregory Hatfield is an 83 year old gentleman who I saw last November of 2012 with a history of hypertension, chronic kidney disease, insulin-dependent diabetes, and dyslipidemia. He has continued to describe no anginal symptoms or worsening shortness of breath. He does have a history of cardiac catheterization in 2005 which was normal, and his main complaints today are left shoulder pain as well as some leg or hip pain. Denies any chest pain, shortness of breath, palpitations, presyncope or syncopal symptoms. He recently reports problems with his hips and says that he may need to have hip replacement. He occasionally has some lower extremity swelling denies any chest pain or worsening shortness of breath.  Mr. Rhoads returns today for preoperative evaluation prior to left hip or placement in August. He is generally asymptomatic and denies any chest pain or shortness of breath. He is able to do some activities although is limited by his hip.  10/31/2016  Mr. Matlock returns today for follow-up. He was recently hospitalized for profound bradycardia with heart rate in the 30s. It was not clear if he possibly was confused about his medications and he is on carvedilol and diltiazem. Those were held initially and was noted to be hypokalemic. He was also given calcium gluconate in the ER and his junctional rhythm improved to sinus rhythm. There was acute on chronic renal disease with creatinine 2.45 which returned to baseline of 1.9. Since discharge he has had heart rates in the 50s and 60s and apparently has restarted on a lower dose of diltiazem 120 mg daily versus 240 mg daily. He is also on carvedilol 6.25 mg twice a day. He denies any chest pain, shortness of breath or presyncopal or syncopal symptoms. He does get some dizziness. He denies any weakness.  PMHx:  Past Medical  History:  Diagnosis Date  . Bradycardia 10/06/2016  . CHF (congestive heart failure) ([place])   . CKD stage 4 due to type 2 diabetes mellitus ([place]) 06/03/2015  . Dyslipidemia associated with type 2 diabetes mellitus ([place]) 05/05/2015  . Essential hypertension 05/05/2015  . IDDM (insulin dependent diabetes mellitus) ([place])   . SDAT (senile dementia of Alzheimer's type) 06/16/2013  . Uncontrolled insulin-dependent diabetes mellitus with renal manifestation ([place]) 05/05/2015    Past Surgical History:  Procedure Laterality Date  . CARDIAC CATHETERIZATION  04/2004   normal r-sided pressure, mild pulm HTN  . CARPAL TUNNEL RELEASE Right   . CERVICAL SPINE SURGERY  1990's  . LUMBAR SPINE SURGERY  2003  . PROSTATE SURGERY  10/2010   transurethral resection   . TOTAL HIP ARTHROPLASTY Left 01/17/2014   Procedure: LEFT TOTAL HIP ARTHROPLASTY;  Surgeon: Yvette Rack., MD;  Location: [facility];  Service: Orthopedics;  Laterality: Left;  . TRANSTHORACIC ECHOCARDIOGRAM  05/2006   EF 60-70%; mild calcif of MV; mild MV regurg; LA mildly dilated  . TRANSURETHRAL RESECTION OF PROSTATE N/A 08/22/2014   Procedure: TRANSURETHRAL RESECTION OF THE PROSTATE (TURP);  Surgeon: Marcia Brash, MD;  Location: [facility];  Service: Urology;  Laterality: N/A;    FAMHx:  Family History  Problem Relation Age of Onset  . Diabetes Mother   . Hypertension Mother   . Prostate cancer Father   . Heart disease Father   . Diabetes Brother   . Cancer Brother   . Heart disease Brother   . Diabetes Sister   .  Heart disease Sister   . Hyperlipidemia Child   . Hypertension Child   . Diabetes Child   . Heart attack Child   . Cancer Child     SOCHx:   reports that he quit smoking about 33 years ago. He has never used smokeless tobacco. He reports that he does not drink alcohol or use drugs.  ALLERGIES:  Allergies  Allergen Reactions  . Ace Inhibitors Other (See Comments) and Cough    Chronic cough  . Demerol Other (See  Comments)    Hard to wake up  . Cosopt [Dorzolamide Hcl-Timolol Mal] Other (See Comments)    Cause heart rate to drop  . Flomax [Tamsulosin Hcl] Other (See Comments)    Low BP  . Morphine And Related Other (See Comments)    hallucinations  . Oxytrol [Oxybutynin] Rash    ROS: Pertinent items noted in HPI and remainder of comprehensive ROS otherwise negative.  HOME MEDS: Current Outpatient Prescriptions  Medication Sig Dispense Refill  . aspirin 81 MG tablet Take 81 mg by mouth daily.    . carvedilol (COREG) 6.25 MG tablet Take 1 tablet (6.25 mg total) by mouth 2 (two) times daily with a meal. 60 tablet 0  . Cholecalciferol (VITAMIN D3) 5000 UNITS CAPS Take 5,000 Units by mouth 2 (two) times daily.     Marland Kitchen dextromethorphan-guaiFENesin (MUCINEX DM) 30-600 MG 12hr tablet Take 1 tablet by mouth 2 (two) times daily.    . furosemide (LASIX) 40 MG tablet TAKE 1 OR 2 TABLETS ONCE DAILY FOR BLOOD PRESSURE AND ANKLE SWELLING. 360 tablet 1  . hydrALAZINE (APRESOLINE) 25 MG tablet TAKE ONE TABLET 3 TIMES A DAY FOR BLOOD PRESSURE. 270 tablet 1  . hyoscyamine (LEVSIN/SL) 0.125 MG SL tablet Place 1 tablet (0.125 mg total) under the tongue every 4 (four) hours as needed. 30 tablet 0  . Insulin Detemir (LEVEMIR FLEXTOUCH) 100 UNIT/ML Pen Inject 55 Units into the skin every morning. And pen needles 1/day 30 mL 11  . LUMIGAN 0.01 % SOLN Place 1 drop into the right eye at bedtime.     . Multiple Vitamin (MULTIVITAMIN) tablet Take 1 tablet by mouth daily.      Marland Kitchen MYRBETRIQ 50 MG TB24 tablet Take 50 mg by mouth daily.     . pravastatin (PRAVACHOL) 40 MG tablet TAKE 1 TABLET ONCE DAILY FOR CHOLESTEROL. 30 tablet 1  . Probiotic Product (PROBIOTIC DAILY PO) Take 1 tablet by mouth daily.     Marland Kitchen senna-docusate (SENOKOT-S) 8.6-50 MG tablet Take 1 tablet by mouth at bedtime. (Patient taking differently: Take 1 tablet by mouth daily. )    . SIMBRINZA 1-0.2 % SUSP Place 1 drop into both eyes 3 (three) times daily.    Marland Kitchen  amLODipine (NORVASC) 5 MG tablet Take 1 tablet (5 mg total) by mouth daily. 90 tablet 3   No current facility-administered medications for this visit.     LABS/IMAGING: No results found for this or any previous visit (from the past 48 hour(s)). No results found.  VITALS: BP (!) 167/69   Pulse 63   Ht 5' 10.5" (1.791 m)   Wt 204 lb 12.8 oz (92.9 kg)   BMI 28.97 kg/m   EXAM: General appearance: alert and no distress Neck: no carotid bruit and no JVD Lungs: clear to auscultation bilaterally Heart: regular rate and rhythm, S1, S2 normal, no murmur, click, rub or gallop Abdomen: soft, non-tender; bowel sounds normal; no masses,  no organomegaly Extremities: extremities normal,  atraumatic, no cyanosis or edema Pulses: 2+ and symmetric Skin: Skin color, texture, turgor normal. No rashes or lesions Neurologic: Grossly normal  EKG: Deferred  ASSESSMENT: 1. Symptomatic bradycardia 2. Type 1 diabetes 3. Hypertension 4. CKD 3 5. Dyslipidemia  PLAN: 1.   Mr. Paolini recently had symptomatic bradycardia with heart rate in the 30s and a junctional rhythm which responded to calcium gluconate and withdrawal of beta blocker and calcium channel blocker. He was restarted on his medicines all be at a lower dose of his diltiazem. There may be underlying conduction disease or this could've been related to medication mismanagement. I recommend discontinuing diltiazem and will place him on amlodipine 5 mg daily. He may need medication increases his blood pressure is still elevated. Will schedule hypertension clinic follow-up in 3 weeks. He should continue on carvedilol.  Follow-up with me annually or sooner as necessary.  Pixie Casino, MD, [place]  Attending Cardiologist  Direct Dial: [phone]  Fax: [phone]  Website:  [email or web address] 10/31/2016, 10:56 AM

## 2016-11-01 ENCOUNTER — Telehealth: Payer: Self-pay | Admitting: Internal Medicine

## 2016-11-01 NOTE — Telephone Encounter (Signed)
Faxed home health certification & plan of care to [facility]

## 2016-11-03 ENCOUNTER — Telehealth: Payer: Self-pay | Admitting: Internal Medicine

## 2016-11-03 NOTE — Telephone Encounter (Signed)
Returned call to [place] PT with [facility].He wanted to ask Dr.Hilty if ok to continue care once a week 4 more times.Advised to call PCP about blood sugar.Message sent to Dr.Hilty.

## 2016-11-03 NOTE — Telephone Encounter (Signed)
I'm okay with continued PT - have them send the order and I will sign it.  Dr. Lemmie Evens

## 2016-11-03 NOTE — Telephone Encounter (Signed)
Spoke to [facility] PT he took a verbal order to continue care once a week 4 more times.

## 2016-11-03 NOTE — Telephone Encounter (Signed)
New message      Calling to let Dr Debara Pickett know pt was to be discharged today, but they are going to continue care one time a week for four more weeks; pts bp today was 142/72.  He has not filled the new presc ---hope to fill it today.  Also, patient's blood sugar last night was 342 and he seems a bit confused today.

## 2016-11-07 ENCOUNTER — Other Ambulatory Visit: Payer: Self-pay | Admitting: Internal Medicine

## 2016-11-07 ENCOUNTER — Telehealth: Payer: Self-pay | Admitting: Internal Medicine

## 2016-11-07 NOTE — Telephone Encounter (Signed)
Faxed signed orders r/t PT for gait training, neuromuscular re-education, home safety to [facility]

## 2016-11-08 ENCOUNTER — Encounter: Payer: Self-pay | Admitting: Physician Assistant

## 2016-11-15 ENCOUNTER — Telehealth: Payer: Self-pay | Admitting: Internal Medicine

## 2016-11-15 ENCOUNTER — Telehealth: Payer: Self-pay

## 2016-11-15 NOTE — Telephone Encounter (Signed)
please call patient: Please tell us specifically how the blood sugar is doing.

## 2016-11-15 NOTE — Telephone Encounter (Signed)
Patient called back to follow up. Advised to allow more time.

## 2016-11-15 NOTE — Telephone Encounter (Signed)
Therapist, music at [place] Nonclinical Telephone Record [place] [facility] [place] Client Site [place] [facility] [place] Type Call Who Is Calling Patient / Member / Family / Caregiver Caller Name Gagandeep Pettet Caller Phone Number [phone] Patient Name Gregory Hatfield. [place] Call Type Message Only Information Provided Reason for Call Request for General Office Information Initial Comment Caller states his blood sugar has been high. He would like to let the dr know that he is needing Novalog increased to a night dose. He declined triage, only wanted to l/m for office.

## 2016-11-15 NOTE — Telephone Encounter (Signed)
Faxed signed home health orders r/t medication changes to [facility]

## 2016-11-15 NOTE — Telephone Encounter (Signed)
Left vm requesting a call back to discuss his blood sugar readings

## 2016-11-16 NOTE — Telephone Encounter (Signed)
please call patient: Please verify he takes levemir, 55 units qam. How are cbg's?

## 2016-11-16 NOTE — Telephone Encounter (Signed)
I contacted the patient. He stated he recently saw Dr. Marisue Brooklyn and [facility] and it was recommended her start the Novolin 70/30 insulin. I asked the patient if he had any recent blood sugars he could report to me and he advised me he did not know much about his blood sugar reading but to contact Estill Bamberg at Dr. Erling Conte office. I contacted her and she stated she did not have any recent blood sugar readings but did have a a1c report from 10/06/2016 at it was 9.7. She stated the 70/30 insulin had been recommended because the patient cognitive function. She stated the patient has been having trouble remembering and MD McKowan thought meal time coverage would be beneficial.  Please advise, Thanks!

## 2016-11-18 ENCOUNTER — Telehealth: Payer: Self-pay

## 2016-11-18 ENCOUNTER — Other Ambulatory Visit: Payer: Self-pay | Admitting: Physician Assistant

## 2016-11-18 NOTE — Telephone Encounter (Signed)
Called pt in order to ascertain what decision he would like to know was made. Message was unclear.  Pt called wanting to know what the provider here & Dr. Loanne Drilling came up with?

## 2016-11-23 ENCOUNTER — Telehealth: Payer: Self-pay | Admitting: Endocrinology

## 2016-11-23 NOTE — Telephone Encounter (Signed)
Do you know which medications you would like to send in? I see the Levemir was increased to 55 anything else?  Please advise. Thank you!

## 2016-11-23 NOTE — Telephone Encounter (Signed)
Patient called to advise that his pcp had spoken to dr Loanne Drilling about changing the dosage of Insulin Detemir (LEVEMIR FLEXTOUCH) 100 UNIT/ML Pen [[id number]] to 65 units. There is no documentation of this that I can find. He also states that there was another drug that his pcp and dr Loanne Drilling agreed to put him on. There is no documentation on this either. He did not know the name of the drug, but only that it was 10 mg dosage.   Please call the patient for clarification if needed, but he insisted that dr Loanne Drilling would know what he was referring to.

## 2016-11-23 NOTE — Telephone Encounter (Signed)
No, just the levemir, 55 units qam

## 2016-11-24 ENCOUNTER — Ambulatory Visit (INDEPENDENT_AMBULATORY_CARE_PROVIDER_SITE_OTHER): Payer: Medicare Other | Admitting: Pharmacist

## 2016-11-24 VITALS — BP 124/58 | HR 62

## 2016-11-24 DIAGNOSIS — I10 Essential (primary) hypertension: Secondary | ICD-10-CM

## 2016-11-24 NOTE — Telephone Encounter (Signed)
Spoke with patient and he was very upset- he does not know the name of the medication that he thinks was prescribed by Estill Bamberg at the [facility]- patient states he spoke with someone at [facility] and they told him it was ok to take 10 units of the medication but he could not tell me who he spoke with- The patient stated Estill Bamberg said it would work well with the levemir but he would have to get permission from Dr. Loanne Drilling before it could be prescribed- we have not received this prescription and there is no documentation of this office saying it was ok to take the medication- I requested that the patient cantact Estill Bamberg at the [facility] since she is the one that prescribed the new medication

## 2016-11-24 NOTE — Progress Notes (Signed)
Patient ID: [name]                 DOB: [birth year]                      MRN: [id number]     HPI: Gregory Hatfield is a 83 y.o. male referred by Dr. Debara Pickett to HTN clinic. PMH includes symptomatic bradycardia, CHF, DM, hypertension, CKD, and dyslipidemia.  He was recently hospitalized with bradycardia due to possible confusion with medication.  During most recent cardiologist office visit on 10/31/16 his diltiazem was discontinued and replaced with amlodipine 5mg .   Patient resents today for HTN follow up. Denies dizziness, swelling or problems with his blood pressure or heart. Only complain today if his poorly controlled diabetes.   Current HTN meds:  Amlodipine 5mg  daily Carvedilol 6.25mg  twice daily Hydralazine 25mg  TID  BP goal: < 140/90   Family History: Diabetes and hypertension from mother;cancer and heart disease from father; diabetes and heart disease siblings, and hyperlipidemia, hypertension, DM and cancer in [place].  Social History: reports that he quit smoking about 33 years ago. He has never used smokeless tobacco. He reports that he does not drink alcohol or use drugs.  Home BP readings: no records available  Wt Readings from Last 3 Encounters:  10/31/16 204 lb 12.8 oz (92.9 kg)  10/28/16 205 lb (93 kg)  10/19/16 203 lb (92.1 kg)   BP Readings from Last 3 Encounters:  11/24/16 (!) 124/58  10/31/16 (!) 167/69  10/28/16 128/68   Pulse Readings from Last 3 Encounters:  11/24/16 62  10/31/16 63  10/28/16 69    Past Medical History:  Diagnosis Date  . Bradycardia 10/06/2016  . CHF (congestive heart failure) ([place])   . CKD stage 4 due to type 2 diabetes mellitus ([place]) 06/03/2015  . Dyslipidemia associated with type 2 diabetes mellitus ([place]) 05/05/2015  . Essential hypertension 05/05/2015  . IDDM (insulin dependent diabetes mellitus) ([place])   . SDAT (senile dementia of Alzheimer's type) 06/16/2013  . Uncontrolled insulin-dependent diabetes mellitus with  renal manifestation ([place]) 05/05/2015    Current Outpatient Prescriptions on File Prior to Visit  Medication Sig Dispense Refill  . amLODipine (NORVASC) 5 MG tablet Take 1 tablet (5 mg total) by mouth daily. 90 tablet 3  . aspirin 81 MG tablet Take 81 mg by mouth daily.    . carvedilol (COREG) 6.25 MG tablet TAKE 1 TABLET TWICE DAILY WITH A MEAL. 60 tablet 1  . Cholecalciferol (VITAMIN D3) 5000 UNITS CAPS Take 5,000 Units by mouth 2 (two) times daily.     Marland Kitchen dextromethorphan-guaiFENesin (MUCINEX DM) 30-600 MG 12hr tablet Take 1 tablet by mouth 2 (two) times daily.    . furosemide (LASIX) 40 MG tablet TAKE 1 OR 2 TABLETS ONCE DAILY FOR BLOOD PRESSURE AND ANKLE SWELLING. 360 tablet 1  . hydrALAZINE (APRESOLINE) 25 MG tablet TAKE ONE TABLET 3 TIMES A DAY FOR BLOOD PRESSURE. 270 tablet 1  . hyoscyamine (LEVSIN/SL) 0.125 MG SL tablet Place 1 tablet (0.125 mg total) under the tongue every 4 (four) hours as needed. 30 tablet 0  . Insulin Detemir (LEVEMIR FLEXTOUCH) 100 UNIT/ML Pen Inject 55 Units into the skin every morning. And pen needles 1/day 30 mL 11  . LUMIGAN 0.01 % SOLN Place 1 drop into the right eye at bedtime.     . Multiple Vitamin (MULTIVITAMIN) tablet Take 1 tablet by mouth daily.      Marland Kitchen MYRBETRIQ 50  MG TB24 tablet Take 50 mg by mouth daily.     . pravastatin (PRAVACHOL) 40 MG tablet TAKE 1 TABLET ONCE DAILY FOR CHOLESTEROL. 30 tablet 1  . Probiotic Product (PROBIOTIC DAILY PO) Take 1 tablet by mouth daily.     Marland Kitchen senna-docusate (SENOKOT-S) 8.6-50 MG tablet Take 1 tablet by mouth at bedtime. (Patient taking differently: Take 1 tablet by mouth daily. )    . SIMBRINZA 1-0.2 % SUSP Place 1 drop into both eyes 3 (three) times daily.     No current facility-administered medications on file prior to visit.     Allergies  Allergen Reactions  . Ace Inhibitors Other (See Comments) and Cough    Chronic cough  . Demerol Other (See Comments)    Hard to wake up  . Cosopt [Dorzolamide  Hcl-Timolol Mal] Other (See Comments)    Cause heart rate to drop  . Flomax [Tamsulosin Hcl] Other (See Comments)    Low BP  . Morphine And Related Other (See Comments)    hallucinations  . Oxytrol [Oxybutynin] Rash    Blood pressure (!) 124/58, pulse 62, SpO2 97 %.  Essential hypertension:  Blood pressure at goal today with HR of 62 bpm. Patient is tolerating current therapy very well and denies intolerance to therapy. Medication reconciliation was completed during appointment and medication list updated. Will continue current therapy without changes and follow up as needed.   Dylon Correa Rodriguez-Guzman PharmD, [place] [id number] 11/24/2016 8:06 PM

## 2016-11-24 NOTE — Telephone Encounter (Signed)
please call patient: I would be happy to help you, but I really need to know the name of the medication.  Can you read it off to [place]?

## 2016-11-24 NOTE — Telephone Encounter (Signed)
Patient states he does not have the paper with the name of the medication on it so he does not know what it is called I advised him to call Estill Bamberg at the [place] to verify what medication she suggested for him- asked patient to call back an let me know what the name of the medication is

## 2016-11-24 NOTE — Patient Instructions (Signed)
Return for a  follow up appointment as needed  Your blood pressure today is 124/58 pulse 62  Check your blood pressure at home daily (if able) and keep record of the readings.  Take your BP meds as follows: **ALL medication as prescribed**  Bring all of your meds, your BP cuff and your record of home blood pressures to your next appointment.  Exercise as you're able, try to walk approximately 30 minutes per day.  Keep salt intake to a minimum, especially watch canned and prepared boxed foods.  Eat more fresh fruits and vegetables and fewer canned items.  Avoid eating in fast food restaurants.    HOW TO TAKE YOUR BLOOD PRESSURE: . Rest 5 minutes before taking your blood pressure. .  Don't smoke or drink caffeinated beverages for at least 30 minutes before. . Take your blood pressure before (not after) you eat. . Sit comfortably with your back supported and both feet on the floor (don't cross your legs). . Elevate your arm to heart level on a table or a desk. . Use the proper sized cuff. It should fit smoothly and snugly around your bare upper arm. There should be enough room to slip a fingertip under the cuff. The bottom edge of the cuff should be 1 inch above the crease of the elbow. . Ideally, take 3 measurements at one sitting and record the average.

## 2016-11-25 NOTE — Telephone Encounter (Signed)
Can I please know exactly how high the blood sugar is?

## 2016-11-25 NOTE — Telephone Encounter (Signed)
Patient still has high bs and wants to know if the 55 units needs to be adjusted?  [place], [place]  Please advise.  Thank you,  -LL

## 2016-11-28 NOTE — Telephone Encounter (Signed)
Patient was told Gregory Hatfield was going to call him back in 15 mins this morning, but has not heard back from anyone. He needs to arrange an appt so his daughter can bring him to it. He lives in [place].  If he has to go to ED she also is his method of transport.  Thursday Morning 298 Lunch 365 Dinner 161 Bedtime 255  Friday  Morning 300 Lunch 343 Dinner 218 Bedtime 242  Saturday June 9th [place] 274 Lunch 266 Dinner 116 Bedtime 140   Sunday June 10th  Morning 305 Lunch 299 Dinner 192 Bedtime 253  Today  Morning 255  Thank you,  -LL

## 2016-11-28 NOTE — Telephone Encounter (Signed)
Requested a call back from the patient for him to report his blood sugar readings.

## 2016-11-28 NOTE — Telephone Encounter (Signed)
See message and please advise during Dr. Ellison's absence. Thanks!  

## 2016-11-28 NOTE — Telephone Encounter (Signed)
Patient returning phone call. Patient stated he may be busy when the call is returned an dit is okay to leave a detailed message. I asked patient to give me his readings but, he insisted on only speaking to [facility] due to the length of time it would take to tell me the numbers for the past 4 weeks.   Patient requested a call back as soon as possible due to having a nurse coming at 11am and meals on wheels coming today as well. Please advise.

## 2016-11-29 NOTE — Telephone Encounter (Signed)
Please add a 20 units Levemir dose at bedtime. May increase to 25 if sugars still high in am in few days.

## 2016-11-29 NOTE — Telephone Encounter (Signed)
Patient notified of message and new instructions and voiced understanding. He had no further questions at this time.

## 2016-12-06 ENCOUNTER — Other Ambulatory Visit: Payer: Self-pay | Admitting: Physician Assistant

## 2016-12-06 ENCOUNTER — Telehealth: Payer: Self-pay | Admitting: Internal Medicine

## 2016-12-06 MED ORDER — INSULIN DETEMIR 100 UNIT/ML FLEXPEN: PEN_INJECTOR | SUBCUTANEOUS | 11 refills | Status: DC

## 2016-12-06 NOTE — Telephone Encounter (Signed)
Faxed signed orders r/t skilled nursing

## 2016-12-13 ENCOUNTER — Ambulatory Visit (INDEPENDENT_AMBULATORY_CARE_PROVIDER_SITE_OTHER): Payer: Medicare Other | Admitting: Pharmacist

## 2016-12-13 VITALS — BP 130/68 | HR 66

## 2016-12-13 DIAGNOSIS — I10 Essential (primary) hypertension: Secondary | ICD-10-CM | POA: Diagnosis not present

## 2016-12-13 NOTE — Patient Instructions (Addendum)
Return for a  follow up appointment in 4 weeks  Your blood pressure today is 130/68 pulse 66  Check your blood pressure at home daily (if able) and keep record of the readings.  Take your BP meds as follows: *Decrease furosemide 40mg  (#7) in the morning ONLY for 2 days (tomorrow and Thursday), then resume taking twice daily* *Continue all other medication as prescribed*  Bring all of your meds, your BP cuff and your record of home blood pressures to your next appointment.  Exercise as you're able, try to walk approximately 30 minutes per day.  Keep salt intake to a minimum, especially watch canned and prepared boxed foods.  Eat more fresh fruits and vegetables and fewer canned items.  Avoid eating in fast food restaurants.    HOW TO TAKE YOUR BLOOD PRESSURE: . Rest 5 minutes before taking your blood pressure. .  Don't smoke or drink caffeinated beverages for at least 30 minutes before. . Take your blood pressure before (not after) you eat. . Sit comfortably with your back supported and both feet on the floor (don't cross your legs). . Elevate your arm to heart level on a table or a desk. . Use the proper sized cuff. It should fit smoothly and snugly around your bare upper arm. There should be enough room to slip a fingertip under the cuff. The bottom edge of the cuff should be 1 inch above the crease of the elbow. . Ideally, take 3 measurements at one sitting and record the average.

## 2016-12-13 NOTE — Progress Notes (Signed)
Patient ID: Gregory Hatfield                 DOB: [birth year]                      MRN: [id number]     HPI: Gregory Hatfield is a 83 y.o. male referred by Dr. Debara Pickett to HTN clinic. PMH includes symptomatic bradycardia, CHF, DM, hypertension, CKD, and dyslipidemia.  He was recently hospitalized with bradycardia due to possible confusion with medication.  During most recent cardiologist office visit on 10/31/16 his diltiazem was discontinued and replaced with amlodipine 5mg  but it was noted PCP re-ordered beta-blocker and diltiazem.  Medication list was corrected and no additional adjustment were made at the time.  Patient resents today for HTN follow up. Denies dizziness, swelling or problems with his blood pressure or heart. Only complain today if his poorly controlled diabetes and some blurry vision possibly related to uncontrolled diabetes.   Current HTN meds:  Amlodipine 5mg  daily Carvedilol 6.25mg  twice daily Hydralazine 25mg  TID Furosemide 40mg  twice daily  BP goal: < 140/90   Family History: Diabetes and hypertension from mother;cancer and heart disease from father; diabetes and heart disease siblings, and hyperlipidemia, hypertension, DM and cancer in cildren.  Social History: reports that he quit smoking about 33 years ago. He has never used smokeless tobacco. He reports that he does not drink alcohol or use drugs.  Home BP readings: 10 readings; average 150/74 (pulse 53-64 bpm)  Wt Readings from Last 3 Encounters:  10/31/16 204 lb 12.8 oz (92.9 kg)  10/28/16 205 lb (93 kg)  10/19/16 203 lb (92.1 kg)   BP Readings from Last 3 Encounters:  12/13/16 130/68  11/24/16 (!) 124/58  10/31/16 (!) 167/69   Pulse Readings from Last 3 Encounters:  12/13/16 68  11/24/16 62  10/31/16 63    Past Medical History:  Diagnosis Date  . Bradycardia 10/06/2016  . CHF (congestive heart failure) ([place])   . CKD stage 4 due to type 2 diabetes mellitus ([place]) 06/03/2015  . Dyslipidemia  associated with type 2 diabetes mellitus ([facility]) 05/05/2015  . Essential hypertension 05/05/2015  . IDDM (insulin dependent diabetes mellitus) ([place])   . SDAT (senile dementia of Alzheimer's type) 06/16/2013  . Uncontrolled insulin-dependent diabetes mellitus with renal manifestation ([place]) 05/05/2015    Current Outpatient Prescriptions on File Prior to Visit  Medication Sig Dispense Refill  . amLODipine (NORVASC) 5 MG tablet Take 1 tablet (5 mg total) by mouth daily. 90 tablet 3  . aspirin 81 MG tablet Take 81 mg by mouth daily.    . carvedilol (COREG) 6.25 MG tablet TAKE 1 TABLET TWICE DAILY WITH A MEAL. 60 tablet 1  . Cholecalciferol (VITAMIN D3) 5000 UNITS CAPS Take 5,000 Units by mouth 2 (two) times daily.     Marland Kitchen dextromethorphan-guaiFENesin (MUCINEX DM) 30-600 MG 12hr tablet Take 1 tablet by mouth 2 (two) times daily.    . furosemide (LASIX) 40 MG tablet TAKE 1 OR 2 TABLETS ONCE DAILY FOR BLOOD PRESSURE AND ANKLE SWELLING. 360 tablet 1  . hydrALAZINE (APRESOLINE) 25 MG tablet TAKE ONE TABLET 3 TIMES A DAY FOR BLOOD PRESSURE. 270 tablet 1  . hyoscyamine (LEVSIN/SL) 0.125 MG SL tablet Place 1 tablet (0.125 mg total) under the tongue every 4 (four) hours as needed. 30 tablet 0  . Insulin Detemir (LEVEMIR FLEXTOUCH) 100 UNIT/ML Pen 20 units in the AM, 55 units at night per Dr. Lissa Merlin 30 mL  11  . LUMIGAN 0.01 % SOLN Place 1 drop into the right eye at bedtime.     . Multiple Vitamin (MULTIVITAMIN) tablet Take 1 tablet by mouth daily.      Marland Kitchen MYRBETRIQ 50 MG TB24 tablet Take 50 mg by mouth daily.     . pravastatin (PRAVACHOL) 40 MG tablet TAKE 1 TABLET ONCE DAILY FOR CHOLESTEROL. 30 tablet 1  . Probiotic Product (PROBIOTIC DAILY PO) Take 1 tablet by mouth daily.     Marland Kitchen senna-docusate (SENOKOT-S) 8.6-50 MG tablet Take 1 tablet by mouth at bedtime. (Patient taking differently: Take 1 tablet by mouth daily. )    . SIMBRINZA 1-0.2 % SUSP Place 1 drop into both eyes 3 (three) times daily.     No current  facility-administered medications on file prior to visit.     Allergies  Allergen Reactions  . Ace Inhibitors Other (See Comments) and Cough    Chronic cough  . Demerol Other (See Comments)    Hard to wake up  . Cosopt [Dorzolamide Hcl-Timolol Mal] Other (See Comments)    Cause heart rate to drop  . Flomax [Tamsulosin Hcl] Other (See Comments)    Low BP  . Morphine And Related Other (See Comments)    hallucinations  . Oxytrol [Oxybutynin] Rash    Blood pressure 130/68, pulse 68, SpO2 98 %.  Essential hypertension:  Blood pressure remains at goal during office visit and pulse also appropriate at 66 bpm. Noted BP readings at home remain above desired goal range with an average reading of 150/74, but patient reports changes on systolic BP from 867 to 619 just by changing placement of arm cuff. Patient was encouraged to bring his home BP cuff to next office visit to calibrate the device and correct technique as needed. He was instructed to bring ALL current medication and BP log. Will continue current therapy without changes until additional information obtained.  Due to reports of blurry vision improved by hydration, patient was also instructed to decrease his furosemide from 80mg  daily to 40mg  for 2 days, then resume 80mg  daily.   Monterio Bob Rodriguez-Guzman PharmD, [place] [id number] 12/13/2016 2:48 PM

## 2016-12-14 ENCOUNTER — Encounter: Payer: Self-pay | Admitting: Pharmacist

## 2016-12-16 NOTE — Telephone Encounter (Signed)
**  Remind patient they can make refill requests via MyChart**  Medication refill request (Name & Dosage):  Insulin Detemir (LEVEMIR FLEXTOUCH) 100 UNIT/ML Pen  Preferred pharmacy (Name & Address):  [facility], [place]  Other comments (if applicable): Out of script and wants to clarify dosage.

## 2016-12-19 ENCOUNTER — Telehealth: Payer: Self-pay | Admitting: Internal Medicine

## 2016-12-19 NOTE — Telephone Encounter (Signed)
New message    Paige from Dr. Laurena Bering is calling to find out when pt had a heart attack.

## 2016-12-19 NOTE — Telephone Encounter (Signed)
Received a call from Hanna at [facility] office.Stated patient sitting in chair to have dental cleaning.After reviewing chart no indication for antibiotic.

## 2016-12-20 ENCOUNTER — Telehealth: Payer: Self-pay | Admitting: Endocrinology

## 2016-12-20 NOTE — Telephone Encounter (Signed)
please call patient: How many units of levemir do you take?

## 2016-12-22 NOTE — Telephone Encounter (Signed)
Patient called to advise that his insulin has been taken care of by his PCP. Patient takes 55 units in the morning and 20 units at night. No further action required.

## 2016-12-22 NOTE — Telephone Encounter (Signed)
See message to be advised.  

## 2016-12-22 NOTE — Telephone Encounter (Signed)
Requested a call back from the patient to further discuss,

## 2016-12-26 ENCOUNTER — Ambulatory Visit: Payer: Medicare Other | Admitting: Endocrinology

## 2016-12-26 ENCOUNTER — Telehealth: Payer: Self-pay | Admitting: Endocrinology

## 2016-12-26 NOTE — Telephone Encounter (Signed)
We are waiting on a call back from the patient to verify his medication dosage. Pharmacy will be contacted once the patient calls back.

## 2016-12-26 NOTE — Telephone Encounter (Signed)
Requested a call back from the patient to discuss further.  

## 2016-12-26 NOTE — Telephone Encounter (Signed)
Pharmacy needs clarification on the Insulin Detemir (LEVEMIR FLEXTOUCH) 100 UNIT/ML Pen. Call pharmacy before the end of the day to advise.

## 2016-12-26 NOTE — Telephone Encounter (Signed)
please call patient: I received cbg record. Please verify insulin is 55 units qam and 20 units qpm Then change to 50 units qam and 25 units qpm. I'll see you next time.

## 2016-12-27 ENCOUNTER — Other Ambulatory Visit: Payer: Self-pay

## 2016-12-27 ENCOUNTER — Other Ambulatory Visit: Payer: Self-pay | Admitting: Internal Medicine

## 2016-12-27 MED ORDER — INSULIN DETEMIR 100 UNIT/ML FLEXPEN: PEN_INJECTOR | SUBCUTANEOUS | 2 refills | Status: DC

## 2016-12-27 NOTE — Telephone Encounter (Signed)
Requested a call back from the patient to further discuss insulin dosage.

## 2016-12-27 NOTE — Addendum Note (Signed)
Addended by: Verlin Grills T on: 12/27/2016 02:48 PM   Modules accepted: Orders

## 2016-12-27 NOTE — Telephone Encounter (Signed)
Patient called back and confirmed he was taking 55 units in the am and 20 units in the pm. Patient agreed to instructions and had no further questions at this time. Patient verbalized understanding on instructions.

## 2016-12-29 ENCOUNTER — Ambulatory Visit (INDEPENDENT_AMBULATORY_CARE_PROVIDER_SITE_OTHER): Payer: Medicare Other | Admitting: Internal Medicine

## 2016-12-29 VITALS — BP 138/66 | HR 68 | Temp 97.5°F | Resp 16 | Ht 70.5 in | Wt 213.6 lb

## 2016-12-29 DIAGNOSIS — E1122 Type 2 diabetes mellitus with diabetic chronic kidney disease: Secondary | ICD-10-CM

## 2016-12-29 DIAGNOSIS — Z794 Long term (current) use of insulin: Secondary | ICD-10-CM

## 2016-12-29 DIAGNOSIS — I10 Essential (primary) hypertension: Secondary | ICD-10-CM

## 2016-12-29 DIAGNOSIS — E119 Type 2 diabetes mellitus without complications: Secondary | ICD-10-CM

## 2016-12-29 DIAGNOSIS — E876 Hypokalemia: Secondary | ICD-10-CM | POA: Diagnosis not present

## 2016-12-29 DIAGNOSIS — Z79899 Other long term (current) drug therapy: Secondary | ICD-10-CM | POA: Diagnosis not present

## 2016-12-29 DIAGNOSIS — N183 Chronic kidney disease, stage 3 (moderate): Secondary | ICD-10-CM

## 2016-12-29 LAB — BASIC METABOLIC PANEL WITH GFR
BUN: 40 mg/dL — ABNORMAL HIGH (ref 7–25)
CALCIUM: 9.6 mg/dL (ref 8.6–10.3)
CO2: 25 mmol/L (ref 20–31)
Chloride: 101 mmol/L (ref 98–110)
Creat: 1.93 mg/dL — ABNORMAL HIGH (ref 0.70–1.11)
GFR, EST AFRICAN AMERICAN: 35 mL/min — AB (ref >=60)
GFR, Est Non African American: 30 mL/min — ABNORMAL LOW (ref >=60)
Glucose, Bld: 143 mg/dL — ABNORMAL HIGH (ref 65–99)
Potassium: 3.7 mmol/L (ref 3.5–5.3)
SODIUM: 137 mmol/L (ref 135–146)

## 2016-12-31 ENCOUNTER — Encounter: Payer: Self-pay | Admitting: Internal Medicine

## 2016-12-31 NOTE — Progress Notes (Signed)
This very nice 83 y.o. [name]  presents for 3 month follow up with Hypertension, Hyperlipidemia, Pre-Diabetes and Vitamin D Deficiency.      Patient is treated for HTN (1992) & BP has been controlled at home. Today's BP is at goal - 138/66. Patient has had no complaints of any cardiac type chest pain, palpitations, dyspnea/orthopnea/PND, dizziness, claudication, or dependent edema.     Hyperlipidemia is controlled with diet & meds. Patient denies myalgias or other med SE's. Last Lipids were at goal with mildly elevated Trig's: Lab Results  Component Value Date   CHOL 144 10/19/2016   HDL 43 10/19/2016   LDLCALC 68 10/19/2016   TRIG 167 (H) 10/19/2016   CHOLHDL 3.3 10/19/2016      Also, the patient has history of insulin requiring T2_DM (1975)  w/CKD3 and control was been limited and compromised by his lack of understanding insulin dynamics and diabetic monitoring and treatment  has been appropriated to avoid wide swings in control and hypoglycemia. Initially in 1975, he was begun on oral agents and then in 1997 was transitioned to Insulin. Patient is unaware of  symptoms of reactive hypoglycemia, diabetic polys, paresthesias or visual blurring.  Last A1c was not at goal: Lab Results  Component Value Date   HGBA1C 9.7 (H) 10/06/2016      Further, the patient also has history of Vitamin D Deficiency and supplements vitamin D without any suspected side-effects. Last vitamin D was borderline elevated  Lab Results  Component Value Date   VD25OH 105 (H) 07/18/2016   Current Outpatient Prescriptions on File Prior to Visit  Medication Sig  . amLODipine (NORVASC) 5 MG tablet Take 1 tablet (5 mg total) by mouth daily.  Marland Kitchen aspirin 81 MG tablet Take 81 mg by mouth daily.  . carvedilol (COREG) 6.25 MG tablet TAKE 1 TABLET TWICE DAILY WITH A MEAL.  Marland Kitchen Cholecalciferol (VITAMIN D3) 5000 UNITS CAPS Take 5,000 Units by mouth 2 (two) times daily.   Marland Kitchen dextromethorphan-guaiFENesin (MUCINEX DM) 30-600 MG  12hr tablet Take 1 tablet by mouth 2 (two) times daily.  . furosemide (LASIX) 40 MG tablet TAKE 1 OR 2 TABLETS ONCE DAILY FOR BLOOD PRESSURE AND ANKLE SWELLING.  . hydrALAZINE (APRESOLINE) 25 MG tablet TAKE ONE TABLET 3 TIMES A DAY FOR BLOOD PRESSURE.  . hyoscyamine (LEVSIN/SL) 0.125 MG SL tablet Place 1 tablet (0.125 mg total) under the tongue every 4 (four) hours as needed.  . Insulin Detemir (LEVEMIR FLEXTOUCH) 100 UNIT/ML Pen 50 units in the am and 25 units in the pm  . LUMIGAN 0.01 % SOLN Place 1 drop into the right eye at bedtime.   . Multiple Vitamin (MULTIVITAMIN) tablet Take 1 tablet by mouth daily.    Marland Kitchen MYRBETRIQ 50 MG TB24 tablet Take 50 mg by mouth daily.   . pravastatin (PRAVACHOL) 40 MG tablet TAKE 1 TABLET ONCE DAILY FOR CHOLESTEROL.  . Probiotic Product (PROBIOTIC DAILY PO) Take 1 tablet by mouth daily.   Marland Kitchen senna-docusate (SENOKOT-S) 8.6-50 MG tablet Take 1 tablet by mouth at bedtime. (Patient taking differently: Take 1 tablet by mouth daily. )  . SIMBRINZA 1-0.2 % SUSP Place 1 drop into both eyes 3 (three) times daily.   No current facility-administered medications on file prior to visit.    Allergies  Allergen Reactions  . Ace Inhibitors Other (See Comments) and Cough    Chronic cough  . Demerol Other (See Comments)    Hard to wake up  .  Cosopt [Dorzolamide Hcl-Timolol Mal] Other (See Comments)    Cause heart rate to drop  . Flomax [Tamsulosin Hcl] Other (See Comments)    Low BP  . Morphine And Related Other (See Comments)    hallucinations  . Oxytrol [Oxybutynin] Rash   PMHx:   Past Medical History:  Diagnosis Date  . Bradycardia 10/06/2016  . CHF (congestive heart failure) ([facility])   . CKD stage 4 due to type 2 diabetes mellitus ([facility]) 06/03/2015  . Dyslipidemia associated with type 2 diabetes mellitus ([facility]) 05/05/2015  . Essential hypertension 05/05/2015  . IDDM (insulin dependent diabetes mellitus) ([facility])   . SDAT (senile dementia of Alzheimer's type) 06/16/2013   . Uncontrolled insulin-dependent diabetes mellitus with renal manifestation ([facility]) 05/05/2015   Immunization History  Administered Date(s) Administered  . DTaP 09/28/2006  . Influenza Whole 04/05/2010, 04/04/2013  . Influenza, High Dose Seasonal PF 04/02/2014, 03/30/2015  . Influenza-Unspecified 02/19/2011  . Pneumococcal Conjugate-13 06/10/2014  . Pneumococcal Polysaccharide-23 07/21/2001, 03/20/2008  . Td 08/28/2008   Past Surgical History:  Procedure Laterality Date  . CARDIAC CATHETERIZATION  04/2004   normal r-sided pressure, mild pulm HTN  . CARPAL TUNNEL RELEASE Right   . CERVICAL SPINE SURGERY  1990's  . LUMBAR SPINE SURGERY  2003  . PROSTATE SURGERY  10/2010   transurethral resection   . TOTAL HIP ARTHROPLASTY Left 01/17/2014   Procedure: LEFT TOTAL HIP ARTHROPLASTY;  Surgeon: Yvette Rack., MD;  Location: [facility];  Service: Orthopedics;  Laterality: Left;  . TRANSTHORACIC ECHOCARDIOGRAM  05/2006   EF 60-70%; mild calcif of MV; mild MV regurg; LA mildly dilated  . TRANSURETHRAL RESECTION OF PROSTATE N/A 08/22/2014   Procedure: TRANSURETHRAL RESECTION OF THE PROSTATE (TURP);  Surgeon: Marcia Brash, MD;  Location: [facility];  Service: Urology;  Laterality: N/A;   FHx:    Reviewed / unchanged  SHx:    Reviewed / unchanged  Systems Review:  Constitutional: Denies fever, chills, wt changes, headaches, insomnia, fatigue, night sweats, change in appetite. Eyes: Denies redness, blurred vision, diplopia, discharge, itchy, watery eyes.  ENT: Denies discharge, congestion, post nasal drip, epistaxis, sore throat, earache, hearing loss, dental pain, tinnitus, vertigo, sinus pain, snoring.  CV: Denies chest pain, palpitations, irregular heartbeat, syncope, dyspnea, diaphoresis, orthopnea, PND, claudication or edema. Respiratory: denies cough, dyspnea, DOE, pleurisy, hoarseness, laryngitis, wheezing.  Gastrointestinal: Denies dysphagia, odynophagia, heartburn, reflux, water brash,  abdominal pain or cramps, nausea, vomiting, bloating, diarrhea, constipation, hematemesis, melena, hematochezia  or hemorrhoids. Genitourinary: Denies dysuria, frequency, urgency, nocturia, hesitancy, discharge, hematuria or flank pain. Musculoskeletal: Denies arthralgias, myalgias, stiffness, jt. swelling, pain, limping or strain/sprain.  Skin: Denies pruritus, rash, hives, warts, acne, eczema or change in skin lesion(s). Neuro: No weakness, tremor, incoordination, spasms, paresthesia or pain. Psychiatric: Denies confusion, memory loss or sensory loss. Endo: Denies change in weight, skin or hair change.  Heme/Lymph: No excessive bleeding, bruising or enlarged lymph nodes.  Physical Exam  BP 138/66   Pulse 68   Temp (!) 97.5 F (36.4 C)   Resp 16   Ht 5' 10.5" (1.791 m)   Wt 213 lb 9.6 oz (96.9 kg)   BMI 30.22 kg/m   Appears OZver nourished, well groomed  and in no distress.  Eyes: PERRLA, EOMs, conjunctiva no swelling or erythema. Sinuses: No frontal/maxillary tenderness ENT/Mouth: EAC's clear, TM's nl w/o erythema, bulging. Nares clear w/o erythema, swelling, exudates. Oropharynx clear without erythema or exudates. Oral hygiene is good. Tongue normal, non obstructing. Hearing intact.  Neck:  Supple. Thyroid nl. Car 2+/2+ without bruits, nodes or JVD. Chest: Respirations nl with BS clear & equal w/o rales, rhonchi, wheezing or stridor.  Cor: Heart sounds normal w/ regular rate and rhythm without sig. murmurs, gallops, clicks or rubs. Peripheral pulses normal and equal  without edema.  Lymphatics: Unremarkable.  Musculoskeletal: Full ROM all peripheral extremities, joint stability, 5/5 strength and normal gait.  Skin: Warm, dry without exposed rashes, lesions or ecchymosis apparent.  Neuro: Cranial nerves intact, reflexes equal bilaterally. Sensory-motor testing grossly intact. Tendon reflexes grossly intact.  Pysch: Alert & oriented x 3.  Insight and judgement poor with flight of  ideas, pressured speech and making little sense. No ideations.  Assessment and Plan:  1. Essential hypertension  - Continue medication, monitor blood pressure at home.  - Continue DASH diet. Reminder to go to the ER if any CP,  SOB, nausea, dizziness, severe HA, changes vision/speech.  - BASIC METABOLIC PANEL WITH GFR  2. Insulin-requiring or dependent type II diabetes mellitus ([place])  - Continue diet/meds, exercise,& lifestyle modifications.  - Continue monitor periodic cholesterol/liver & renal functions   3. CKD stage 3 due to type 2 diabetes mellitus ([facility])  - Continue diet, exercise, lifestyle modifications.  - Monitor appropriate labs.  - BASIC METABOLIC PANEL WITH GFR  4. Hypokalemia  - Continue supplementation. - BASIC METABOLIC PANEL WITH GFR  5. Medication management  - BASIC METABOLIC PANEL WITH GFR       Discussed  regular exercise, BP monitoring, weight control to achieve/maintain BMI less than 25 and discussed med and SE's. Recommended labs to assess and monitor clinical status with further disposition pending results of labs. Over 30 minutes of exam, counseling, chart review was performed.

## 2017-01-06 ENCOUNTER — Other Ambulatory Visit: Payer: Self-pay | Admitting: Internal Medicine

## 2017-01-10 ENCOUNTER — Ambulatory Visit: Payer: Medicare Other

## 2017-01-10 ENCOUNTER — Ambulatory Visit (INDEPENDENT_AMBULATORY_CARE_PROVIDER_SITE_OTHER): Payer: Medicare Other | Admitting: Sports Medicine

## 2017-01-10 DIAGNOSIS — M79676 Pain in unspecified toe(s): Secondary | ICD-10-CM | POA: Diagnosis not present

## 2017-01-10 DIAGNOSIS — E119 Type 2 diabetes mellitus without complications: Secondary | ICD-10-CM | POA: Diagnosis not present

## 2017-01-10 DIAGNOSIS — B351 Tinea unguium: Secondary | ICD-10-CM | POA: Diagnosis not present

## 2017-01-10 NOTE — Progress Notes (Signed)
Patient ID: Gregory Hatfield, male   DOB: 08-Jan-1926, 83 y.o.   MRN: [id number]  Subjective: Gregory Hatfield is a 83 y.o. male patient with history of type 2 diabetes who presents to office today complaining of long, painful nails while ambulating in shoes; unable to trim. Patient denies any other new problems.   Patient provided list of current medications.   Patient Active Problem List   Diagnosis Date Noted  . Symptomatic bradycardia 10/06/2016  . Diabetes mellitus with complication ([facility]) 89/[birth year]/1941  . AKI (acute kidney injury) ([facility]) 10/06/2016  . Chronic diastolic CHF (congestive heart failure) ([facility]) 10/06/2016  . Hyperlipidemia 01/12/2016  . Prostate cancer ([place]) 10/12/2015  . Diabetic retinopathy ([facility] [place]) 10/12/2015  . Encounter for Medicare annual wellness exam 10/12/2015  . Open-angle glaucoma 07/06/2015  . Vitamin D deficiency 06/03/2015  . Medication management 06/03/2015  . Stage 3 chronic kidney disease 06/03/2015  . Insulin-requiring or dependent type II diabetes mellitus ([place]) 05/05/2015  . Essential hypertension 05/05/2015  . SDAT (senile dementia of Alzheimer's type) 06/16/2013   Current Outpatient Prescriptions on File Prior to Visit  Medication Sig Dispense Refill  . amLODipine (NORVASC) 5 MG tablet Take 1 tablet (5 mg total) by mouth daily. 90 tablet 3  . aspirin 81 MG tablet Take 81 mg by mouth daily.    . carvedilol (COREG) 6.25 MG tablet TAKE 1 TABLET TWICE DAILY WITH A MEAL. 180 tablet 1  . Cholecalciferol (VITAMIN D3) 5000 UNITS CAPS Take 5,000 Units by mouth 2 (two) times daily.     Marland Kitchen dextromethorphan-guaiFENesin (MUCINEX DM) 30-600 MG 12hr tablet Take 1 tablet by mouth 2 (two) times daily.    . furosemide (LASIX) 40 MG tablet TAKE 1 OR 2 TABLETS ONCE DAILY FOR BLOOD PRESSURE AND ANKLE SWELLING. 360 tablet 1  . hydrALAZINE (APRESOLINE) 25 MG tablet TAKE ONE TABLET 3 TIMES A DAY FOR BLOOD PRESSURE. 270 tablet 1  . hyoscyamine (LEVSIN/SL) 0.125 MG SL tablet  Place 1 tablet (0.125 mg total) under the tongue every 4 (four) hours as needed. 30 tablet 0  . Insulin Detemir (LEVEMIR FLEXTOUCH) 100 UNIT/ML Pen 50 units in the am and 25 units in the pm 30 mL 2  . LUMIGAN 0.01 % SOLN Place 1 drop into the right eye at bedtime.     . Multiple Vitamin (MULTIVITAMIN) tablet Take 1 tablet by mouth daily.      Marland Kitchen MYRBETRIQ 50 MG TB24 tablet Take 50 mg by mouth daily.     . pravastatin (PRAVACHOL) 40 MG tablet TAKE 1 TABLET ONCE DAILY FOR CHOLESTEROL. 30 tablet 1  . Probiotic Product (PROBIOTIC DAILY PO) Take 1 tablet by mouth daily.     Marland Kitchen senna-docusate (SENOKOT-S) 8.6-50 MG tablet Take 1 tablet by mouth at bedtime. (Patient taking differently: Take 1 tablet by mouth daily. )    . SIMBRINZA 1-0.2 % SUSP Place 1 drop into both eyes 3 (three) times daily.     No current facility-administered medications on file prior to visit.    Allergies  Allergen Reactions  . Ace Inhibitors Other (See Comments) and Cough    Chronic cough  . Demerol Other (See Comments)    Hard to wake up  . Cosopt [Dorzolamide Hcl-Timolol Mal] Other (See Comments)    Cause heart rate to drop  . Flomax [Tamsulosin Hcl] Other (See Comments)    Low BP  . Morphine And Related Other (See Comments)    hallucinations  . Oxytrol [Oxybutynin] Rash  Objective: General: Patient is awake, alert, and oriented x 3 and in no acute distress.  Integument: Skin is warm, dry and supple bilateral. Nails are tender, long, thickened and dystrophic with subungual debris, consistent with onychomycosis, 1-5 bilateral. No signs of infection. No open lesions or preulcerative lesions present bilateral. Remaining integument unremarkable.  Vasculature:  Dorsalis Pedis pulse 1/4 bilateral. Posterior Tibial pulse  0/4 bilateral. No ischemia or gangrene. Capillary fill time <3 sec 1-5 bilateral. Positive hair growth to the level of the digits.Temperature gradient within normal limits. No varicosities present  bilateral. Trace edema present bilateral.   Neurology: The patient has intact sensation measured with a 5.07/10g Semmes Weinstein Monofilament at all pedal sites bilateral . Vibratory sensation diminished bilateral with tuning fork. No Babinski sign present bilateral.   Musculoskeletal: No symptomatic pedal deformities noted bilateral. Muscular strength 5/5 in all lower extremity muscular groups bilateral without pain or limitation on range of motion. No tenderness with calf compression bilateral.  Assessment and Plan: Problem List Items Addressed This Visit    None    Visit Diagnoses    Pain due to onychomycosis of toenail    -  Primary   Diabetes mellitus without complication (Gregory Hatfield)         -Examined patient. -Discussed and educated patient on diabetic foot care, especially with  regards to the vascular, neurological and musculoskeletal systems.  -Stressed the importance of good glycemic control and the detriment of not  controlling glucose levels in relation to the foot. -Mechanically debrided all nails 1-5 bilateral using sterile nail nipper and filed with dremel without incident  -Answered all patient questions -Patient to return in 3 months for at risk foot care -Patient advised to call the office if any problems or questions arise in the meantime.  Landis Martins, DPM

## 2017-01-12 ENCOUNTER — Ambulatory Visit (INDEPENDENT_AMBULATORY_CARE_PROVIDER_SITE_OTHER): Payer: Medicare Other | Admitting: Pharmacist Clinician (PhC)/ Clinical Pharmacy Specialist

## 2017-01-12 ENCOUNTER — Encounter: Payer: Self-pay | Admitting: Pharmacist Clinician (PhC)/ Clinical Pharmacy Specialist

## 2017-01-12 DIAGNOSIS — I10 Essential (primary) hypertension: Secondary | ICD-10-CM | POA: Diagnosis not present

## 2017-01-12 MED ORDER — HYDRALAZINE HCL 50 MG PO TABS: 50.0000 mg | ORAL_TABLET | Freq: Three times a day (TID) | ORAL | 3 refills | Status: DC

## 2017-01-12 NOTE — Assessment & Plan Note (Signed)
Patient with poorly controlled hypertension.  Will increase his hydralazine to 50 mg three times daily.  He will continue with daily home BP checks and return in 4-5 weeks for follow up.

## 2017-01-12 NOTE — Progress Notes (Signed)
Patient ID: Gregory Hatfield                 DOB: [birth year]                      MRN: [id number]     HPI: Gregory Hatfield is a 83 y.o. male referred by Dr. Debara Pickett to HTN clinic. PMH includes symptomatic bradycardia, CHF, DM, hypertension, CKD, and dyslipidemia.  He was also hospitalized earlier this year with bradycardia and possible confusion with medications.  He has been seen twice now in the hypertension clinic, however with his confusion and our uncertainty about his meds, no medication changes have yet been made.  Today he is here with his aide, as well as his home BP cuff.   No complaints about his health today, no dizziness, chest pain or shortness of breath.  He does endorse occasional lower extremity edema, but not enough to be bothersome at this time.    Current HTN meds:  Amlodipine 5mg  daily Carvedilol 6.25mg  twice daily Hydralazine 25mg  TID Furosemide 40mg  twice daily  BP goal: < 140/90   Family History: Diabetes and hypertension from mother;cancer and heart disease from father; diabetes and heart disease siblings, and hyperlipidemia, hypertension, DM and cancer in cildren.  Social History: reports that he quit smoking about 33 years ago. He has never used smokeless tobacco. He reports that he does not drink alcohol or use drugs  Coffee tid with meals, no other caffeine  Diet:  Gets meals on wheels; does admit to adding salt tot many foods  Home BP readings: checks 4 times per day, almost all readings > 765 systolic, home cuff [employer], brought with him to office today  Read within 10 points of office machine.  Reviewed cuff technique with patient.    Wt Readings from Last 3 Encounters:  12/29/16 213 lb 9.6 oz (96.9 kg)  10/31/16 204 lb 12.8 oz (92.9 kg)  10/28/16 205 lb (93 kg)   BP Readings from Last 3 Encounters:  12/29/16 138/66  12/13/16 130/68  11/24/16 (!) 124/58   Pulse Readings from Last 3 Encounters:  12/29/16 68  12/13/16 66  11/24/16 62    Past  Medical History:  Diagnosis Date  . Bradycardia 10/06/2016  . CHF (congestive heart failure) ([place])   . CKD stage 4 due to type 2 diabetes mellitus ([place]) 06/03/2015  . Dyslipidemia associated with type 2 diabetes mellitus ([place]) 05/05/2015  . Essential hypertension 05/05/2015  . IDDM (insulin dependent diabetes mellitus) ([place])   . SDAT (senile dementia of Alzheimer's type) 06/16/2013  . Uncontrolled insulin-dependent diabetes mellitus with renal manifestation ([place]) 05/05/2015    Current Outpatient Prescriptions on File Prior to Visit  Medication Sig Dispense Refill  . amLODipine (NORVASC) 5 MG tablet Take 1 tablet (5 mg total) by mouth daily. 90 tablet 3  . aspirin 81 MG tablet Take 81 mg by mouth daily.    . carvedilol (COREG) 6.25 MG tablet TAKE 1 TABLET TWICE DAILY WITH A MEAL. 180 tablet 1  . Cholecalciferol (VITAMIN D3) 5000 UNITS CAPS Take 5,000 Units by mouth 2 (two) times daily.     Marland Kitchen dextromethorphan-guaiFENesin (MUCINEX DM) 30-600 MG 12hr tablet Take 1 tablet by mouth 2 (two) times daily.    . furosemide (LASIX) 40 MG tablet TAKE 1 OR 2 TABLETS ONCE DAILY FOR BLOOD PRESSURE AND ANKLE SWELLING. 360 tablet 1  . hydrALAZINE (APRESOLINE) 25 MG tablet TAKE ONE TABLET 3 TIMES A DAY  FOR BLOOD PRESSURE. 270 tablet 1  . hyoscyamine (LEVSIN/SL) 0.125 MG SL tablet Place 1 tablet (0.125 mg total) under the tongue every 4 (four) hours as needed. 30 tablet 0  . Insulin Detemir (LEVEMIR FLEXTOUCH) 100 UNIT/ML Pen 50 units in the am and 25 units in the pm 30 mL 2  . LUMIGAN 0.01 % SOLN Place 1 drop into the right eye at bedtime.     . Multiple Vitamin (MULTIVITAMIN) tablet Take 1 tablet by mouth daily.      Marland Kitchen MYRBETRIQ 50 MG TB24 tablet Take 50 mg by mouth daily.     . pravastatin (PRAVACHOL) 40 MG tablet TAKE 1 TABLET ONCE DAILY FOR CHOLESTEROL. 30 tablet 1  . Probiotic Product (PROBIOTIC DAILY PO) Take 1 tablet by mouth daily.     Marland Kitchen senna-docusate (SENOKOT-S) 8.6-50 MG tablet Take 1 tablet by  mouth at bedtime. (Patient taking differently: Take 1 tablet by mouth daily. )    . SIMBRINZA 1-0.2 % SUSP Place 1 drop into both eyes 3 (three) times daily.     No current facility-administered medications on file prior to visit.     Allergies  Allergen Reactions  . Ace Inhibitors Other (See Comments) and Cough    Chronic cough  . Demerol Other (See Comments)    Hard to wake up  . Cosopt [Dorzolamide Hcl-Timolol Mal] Other (See Comments)    Cause heart rate to drop  . Flomax [Tamsulosin Hcl] Other (See Comments)    Low BP  . Morphine And Related Other (See Comments)    hallucinations  . Oxytrol [Oxybutynin] Rash    There were no vitals taken for this visit.  Essential hypertension:  Patient with poorly controlled hypertension.  Will increase his hydralazine to 50 mg three times daily.  He will continue with daily home BP checks and return in 4-5 weeks for follow up.   Tommy Medal PharmD CPP [facility] [address] [place] [address] 01/12/2017 2:19 PM

## 2017-01-12 NOTE — Patient Instructions (Addendum)
Return for a a follow up appointment in 4 weeks  Your blood pressure today is 152/72 (goal is < 130/80)  Check your blood pressure at home daily and keep record of the readings.  Take your BP meds as follows:  Increase hydralazine to 50 mg three times daily.  Prescription at [facility].  You can take 2 of the 25 mg tablets three times daily until they are gone before starting the 50 mg tablets.  Bring all of your meds, your BP cuff and your record of home blood pressures to your next appointment.  Exercise as you're able, try to walk approximately 30 minutes per day.  Keep salt intake to a minimum, especially watch canned and prepared boxed foods.  Eat more fresh fruits and vegetables and fewer canned items.  Avoid eating in fast food restaurants.    HOW TO TAKE YOUR BLOOD PRESSURE: . Rest 5 minutes before taking your blood pressure. .  Don't smoke or drink caffeinated beverages for at least 30 minutes before. . Take your blood pressure before (not after) you eat. . Sit comfortably with your back supported and both feet on the floor (don't cross your legs). . Elevate your arm to heart level on a table or a desk. . Use the proper sized cuff. It should fit smoothly and snugly around your bare upper arm. There should be enough room to slip a fingertip under the cuff. The bottom edge of the cuff should be 1 inch above the crease of the elbow. . Ideally, take 3 measurements at one sitting and record the average.

## 2017-01-16 ENCOUNTER — Other Ambulatory Visit: Payer: Self-pay | Admitting: Internal Medicine

## 2017-01-31 ENCOUNTER — Ambulatory Visit: Payer: Medicare Other | Admitting: Endocrinology

## 2017-02-11 ENCOUNTER — Other Ambulatory Visit: Payer: Self-pay | Admitting: Endocrinology

## 2017-02-14 ENCOUNTER — Ambulatory Visit (INDEPENDENT_AMBULATORY_CARE_PROVIDER_SITE_OTHER): Payer: Medicare Other | Admitting: Pharmacist Clinician (PhC)/ Clinical Pharmacy Specialist

## 2017-02-14 DIAGNOSIS — I10 Essential (primary) hypertension: Secondary | ICD-10-CM

## 2017-02-14 NOTE — Patient Instructions (Signed)
Return for a a follow up appointment in 3 months  Your blood pressure today is 112/58  (goal is < 130/80)  Check your blood pressure at home daily and keep record of the readings.  Take your BP meds as follows:  Continue with all current medications  Bring all of your meds, your BP cuff and your record of home blood pressures to your next appointment.  Exercise as you're able, try to walk approximately 30 minutes per day.  Keep salt intake to a minimum, especially watch canned and prepared boxed foods.  Eat more fresh fruits and vegetables and fewer canned items.  Avoid eating in fast food restaurants.    HOW TO TAKE YOUR BLOOD PRESSURE: . Rest 5 minutes before taking your blood pressure. .  Don't smoke or drink caffeinated beverages for at least 30 minutes before. . Take your blood pressure before (not after) you eat. . Sit comfortably with your back supported and both feet on the floor (don't cross your legs). . Elevate your arm to heart level on a table or a desk. . Use the proper sized cuff. It should fit smoothly and snugly around your bare upper arm. There should be enough room to slip a fingertip under the cuff. The bottom edge of the cuff should be 1 inch above the crease of the elbow. . Ideally, take 3 measurements at one sitting and record the average.

## 2017-02-14 NOTE — Progress Notes (Signed)
Patient ID: Gregory Hatfield                 DOB: 05-Aug-1925                      MRN: [id number]     HPI: Gregory Hatfield is a 83 y.o. male referred by Dr. Debara Pickett to HTN clinic. PMH includes symptomatic bradycardia, CHF, DM, hypertension, CKD, and dyslipidemia.  He was also hospitalized earlier this year with bradycardia and possible confusion with medications.  Now that he has been seen 3 times in the hypertension clinic, he seems to have a better grasp on his medications.  He is here today with his med aide, and she reports that he now has a "system" to remember to take his medications, and he does quite well with it. Even remembers mid-day hydralazine dose without any problems.    No complaints about his health today, no dizziness, chest pain or shortness of breath.  He does endorse occasional lower extremity edema, but not enough to be bothersome at this time.  He gets many of his meals from [employer], so he cannot control his diet too much.  Because of this he has a difficult time with his blood sugars, which seem to spike and drop regularly (range last week 88-413)  Current HTN meds:  Amlodipine 5mg  daily Carvedilol 6.25mg  twice daily Hydralazine 25mg  TID Furosemide 40mg  twice daily  BP goal: < 140/90   Family History: Diabetes and hypertension from mother;cancer and heart disease from father; diabetes and heart disease siblings, and hyperlipidemia, hypertension, DM and cancer in cildren.  Social History: reports that he quit smoking about 33 years ago. He has never used smokeless tobacco. He reports that he does not drink alcohol or use drugs  Coffee tid with meals, no other caffeine  Diet:  Gets meals on wheels; does admit to adding salt tot many foods  Home BP readings: checks 4 times per day, home cuff is from [employer].  At his last visit we checked his machine and technique.  Read within 10 points of the office cuff.  Averages for last week (27 readings) was 153/78, and  this week his first 5 readings were all < 650 systolic.    Intolerances: ACEI caused cough  Wt Readings from Last 3 Encounters:  12/29/16 213 lb 9.6 oz (96.9 kg)  10/31/16 204 lb 12.8 oz (92.9 kg)  10/28/16 205 lb (93 kg)   BP Readings from Last 3 Encounters:  02/14/17 (!) 112/58  01/12/17 (!) 152/72  12/29/16 138/66   Pulse Readings from Last 3 Encounters:  02/14/17 72  01/12/17 60  12/29/16 68    Past Medical History:  Diagnosis Date  . Bradycardia 10/06/2016  . CHF (congestive heart failure) ([place])   . CKD stage 4 due to type 2 diabetes mellitus ([place]) 06/03/2015  . Dyslipidemia associated with type 2 diabetes mellitus ([place]) 05/05/2015  . Essential hypertension 05/05/2015  . IDDM (insulin dependent diabetes mellitus) ([place])   . SDAT (senile dementia of Alzheimer's type) 06/16/2013  . Uncontrolled insulin-dependent diabetes mellitus with renal manifestation ([place]) 05/05/2015    Current Outpatient Prescriptions on File Prior to Visit  Medication Sig Dispense Refill  . amLODipine (NORVASC) 5 MG tablet Take 1 tablet (5 mg total) by mouth daily. 90 tablet 3  . aspirin 81 MG tablet Take 81 mg by mouth daily.    . carvedilol (COREG) 6.25 MG tablet TAKE 1 TABLET TWICE  DAILY WITH A MEAL. 180 tablet 1  . Cholecalciferol (VITAMIN D3) 5000 UNITS CAPS Take 5,000 Units by mouth 2 (two) times daily.     Marland Kitchen dextromethorphan-guaiFENesin (MUCINEX DM) 30-600 MG 12hr tablet Take 1 tablet by mouth 2 (two) times daily.    . furosemide (LASIX) 40 MG tablet TAKE 1 OR 2 TABLETS ONCE DAILY FOR BLOOD PRESSURE AND ANKLE SWELLING. 180 tablet 1  . hydrALAZINE (APRESOLINE) 50 MG tablet Take 1 tablet (50 mg total) by mouth 3 (three) times daily. 90 tablet 3  . hyoscyamine (LEVSIN/SL) 0.125 MG SL tablet Place 1 tablet (0.125 mg total) under the tongue every 4 (four) hours as needed. 30 tablet 0  . Insulin Detemir (LEVEMIR FLEXTOUCH) 100 UNIT/ML Pen 50 units in the am and 25 units in the pm 30 mL 2  . LUMIGAN  0.01 % SOLN Place 1 drop into the right eye at bedtime.     . Multiple Vitamin (MULTIVITAMIN) tablet Take 1 tablet by mouth daily.      Marland Kitchen MYRBETRIQ 50 MG TB24 tablet Take 50 mg by mouth daily.     . ONE TOUCH ULTRA TEST test strip CHECK BLOOD SUGAR 6 TIMES A DAY. 540 each 3  . pravastatin (PRAVACHOL) 40 MG tablet TAKE 1 TABLET ONCE DAILY FOR CHOLESTEROL. 30 tablet 1  . Probiotic Product (PROBIOTIC DAILY PO) Take 1 tablet by mouth daily.     Marland Kitchen senna-docusate (SENOKOT-S) 8.6-50 MG tablet Take 1 tablet by mouth at bedtime. (Patient taking differently: Take 1 tablet by mouth daily. )    . SIMBRINZA 1-0.2 % SUSP Place 1 drop into both eyes 3 (three) times daily.     No current facility-administered medications on file prior to visit.     Allergies  Allergen Reactions  . Ace Inhibitors Other (See Comments) and Cough    Chronic cough  . Demerol Other (See Comments)    Hard to wake up  . Cosopt [Dorzolamide Hcl-Timolol Mal] Other (See Comments)    Cause heart rate to drop  . Flomax [Tamsulosin Hcl] Other (See Comments)    Low BP  . Morphine And Related Other (See Comments)    hallucinations  . Oxytrol [Oxybutynin] Rash    Blood pressure (!) 112/58, pulse 72.  Essential hypertension:  Blood pressure well at goal in the office today despite continued elevated readings at home.  Readings from this week at home show a downward trend, has past 5-6 readings < 620 systolic.  With his poor balance and excellent office reading today, I will not make any medication changes.  He will continue with daily home BP checks and we will see him back in 3 months for a follow up visit.     Tommy Medal PharmD CPP [facility] [address] [place] [address] 02/14/2017 2:42 PM

## 2017-02-14 NOTE — Assessment & Plan Note (Signed)
Blood pressure well at goal in the office today despite continued elevated readings at home.  Readings from this week at home show a downward trend, has past 5-6 readings < 740 systolic.  With his poor balance and excellent office reading today, I will not make any medication changes.  He will continue with daily home BP checks and we will see him back in 3 months for a follow up visit.

## 2017-02-15 ENCOUNTER — Encounter: Payer: Self-pay | Admitting: Internal Medicine

## 2017-02-15 NOTE — Progress Notes (Signed)
[facility]   [name], M.D.      Uvaldo Bristle. Silverio Lay, P.A.-[facility]                [address] [place], [place] [address] Telephone [phone] Telefax [phone] Annual  Screening/Preventative Visit  & Comprehensive Evaluation & Examination     This very nice 84 y.o. cognitively challenged [facility] presents for a Screening/Preventative Visit & comprehensive evaluation and management of multiple medical co-morbidities.  Patient has been followed for HTN, Insulin Requiring  T2_DM , Hyperlipidemia and Vitamin D Deficiency.     HTN predates since 55. Patient's BP has been controlled at home.  Today's BP is at goal - 138/84. Patient denies any cardiac symptoms as chest pain, palpitations, shortness of breath, dizziness or ankle swelling.     Patient's hyperlipidemia is controlled with diet and medications. Patient denies myalgias or other medication SE's. Last lipids were  Lab Results  Component Value Date   CHOL 143 02/16/2017   HDL 41 02/16/2017   LDLCALC 68 10/19/2016   TRIG 176 (H) 02/16/2017   CHOLHDL 3.5 02/16/2017      Patient has T2_NIDDM w/CKD 3 (GFR 32 ml/min) since 1975 and patient denies reactive hypoglycemic symptoms, visual blurring, diabetic polys or paresthesias. Patients's control has been compromised by his lack of comprehension and inability to adjust his insulin dosing sliding scale.  In 1997 , he was transitioned to insulin. Last A1c was not at goal and Dr Loanne Drilling is controlling with limitations of patient's Dementia Lab Results  Component Value Date   HGBA1C 8.6 (H) 02/16/2017       Finally, patient has history of Vitamin D Deficiency ("28" in 2008) and last vitamin D was at goal: Lab Results  Component Value Date   VD25OH 82 02/16/2017   Current Outpatient Prescriptions on File Prior to Visit  Medication Sig  . aspirin 81 MG tablet Take 81 mg by mouth daily.  .  carvedilol (COREG) 6.25 MG tablet TAKE 1 TABLET TWICE DAILY WITH A MEAL.  Marland Kitchen Cholecalciferol (VITAMIN D3) 5000 UNITS CAPS Take 5,000 Units by mouth 2 (two) times daily.   Marland Kitchen dextromethorphan-guaiFENesin (MUCINEX DM) 30-600 MG 12hr tablet Take 1 tablet by mouth 2 (two) times daily.  . furosemide (LASIX) 40 MG tablet TAKE 1 OR 2 TABLETS ONCE DAILY FOR BLOOD PRESSURE AND ANKLE SWELLING.  . hydrALAZINE (APRESOLINE) 50 MG tablet Take 1 tablet (50 mg total) by mouth 3 (three) times daily.  . hyoscyamine (LEVSIN/SL) 0.125 MG SL tablet Place 1 tablet (0.125 mg total) under the tongue every 4 (four) hours as needed.  . Insulin Detemir (LEVEMIR FLEXTOUCH) 100 UNIT/ML Pen 50 units in the am and 25 units in the pm  . LUMIGAN 0.01 % SOLN Place 1 drop into the right eye at bedtime.   . Multiple Vitamin (MULTIVITAMIN) tablet Take 1 tablet by mouth daily.    Marland Kitchen MYRBETRIQ 50 MG TB24 tablet Take 50 mg by mouth daily.   . ONE TOUCH ULTRA TEST test strip CHECK BLOOD SUGAR 6 TIMES A DAY.  . pravastatin (PRAVACHOL) 40 MG tablet TAKE 1 TABLET ONCE DAILY FOR CHOLESTEROL.  . Probiotic Product (PROBIOTIC DAILY PO) Take 1 tablet by mouth daily.   Marland Kitchen senna-docusate (SENOKOT-S) 8.6-50 MG tablet Take 1 tablet by mouth at bedtime. (Patient taking differently: Take 1 tablet by mouth daily. )  .  SIMBRINZA 1-0.2 % SUSP Place 1 drop into both eyes 3 (three) times daily.  Marland Kitchen amLODipine (NORVASC) 5 MG tablet Take 1 tablet (5 mg total) by mouth daily.   No current facility-administered medications on file prior to visit.    Allergies  Allergen Reactions  . Ace Inhibitors Other (See Comments) and Cough    Chronic cough  . Demerol Other (See Comments)    Hard to wake up  . Cosopt [Dorzolamide Hcl-Timolol Mal] Other (See Comments)    Cause heart rate to drop  . Flomax [Tamsulosin Hcl] Other (See Comments)    Low BP  . Morphine And Related Other (See Comments)    hallucinations  . Oxytrol [Oxybutynin] Rash   Past Medical History:   Diagnosis Date  . Bradycardia 10/06/2016  . CHF (congestive heart failure) ([place])   . CKD stage 4 due to type 2 diabetes mellitus ([place]) 06/03/2015  . Dyslipidemia associated with type 2 diabetes mellitus ([place]) 05/05/2015  . Essential hypertension 05/05/2015  . IDDM (insulin dependent diabetes mellitus) ([place])   . SDAT (senile dementia of Alzheimer's type) 06/16/2013  . Uncontrolled insulin-dependent diabetes mellitus with renal manifestation ([place]) 05/05/2015   Health Maintenance  Topic Date Due  . OPHTHALMOLOGY EXAM  02/26/2016  . INFLUENZA VACCINE  01/18/2017  . HEMOGLOBIN A1C  08/17/2017  . FOOT EXAM  02/15/2018  . URINE MICROALBUMIN  02/16/2018  . TETANUS/TDAP  08/29/2018  . PNA vac Low Risk Adult  Completed   Immunization History  Administered Date(s) Administered  . DTaP 09/28/2006  . Influenza Whole 04/05/2010, 04/04/2013  . Influenza, High Dose Seasonal PF 04/02/2014, 03/30/2015  . Influenza-Unspecified 02/19/2011  . Pneumococcal Conjugate-13 06/10/2014  . Pneumococcal Polysaccharide-23 07/21/2001, 03/20/2008  . Td 08/28/2008   Past Surgical History:  Procedure Laterality Date  . CARDIAC CATHETERIZATION  04/2004   normal r-sided pressure, mild pulm HTN  . CARPAL TUNNEL RELEASE Right   . CERVICAL SPINE SURGERY  1990's  . LUMBAR SPINE SURGERY  2003  . PROSTATE SURGERY  10/2010   transurethral resection   . TOTAL HIP ARTHROPLASTY Left 01/17/2014   Procedure: LEFT TOTAL HIP ARTHROPLASTY;  Surgeon: Yvette Rack., MD;  Location: [place];  Service: Orthopedics;  Laterality: Left;  . TRANSTHORACIC ECHOCARDIOGRAM  05/2006   EF 60-70%; mild calcif of MV; mild MV regurg; LA mildly dilated  . TRANSURETHRAL RESECTION OF PROSTATE N/A 08/22/2014   Procedure: TRANSURETHRAL RESECTION OF THE PROSTATE (TURP);  Surgeon: Marcia Brash, MD;  Location: [facility];  Service: Urology;  Laterality: N/A;   Family History  Problem Relation Age of Onset  . Diabetes Mother   . Hypertension  Mother   . Prostate cancer Father   . Heart disease Father   . Diabetes Brother   . Cancer Brother   . Heart disease Brother   . Diabetes Sister   . Heart disease Sister   . Hyperlipidemia Child   . Hypertension Child   . Diabetes Child   . Heart attack Child   . Cancer Child    Social History   Social History  . Marital status: Widowed    Spouse name: N/A  . Number of children: 5  . Years of education: master's   Occupational History  . teacher/instructor Retired   Social History Main Topics  . Smoking status: Former Smoker    Quit date: 06/21/1983  . Smokeless tobacco: Never Used  . Alcohol use No  . Drug use: No  . Sexual activity:  No    ROS Constitutional: Denies fever, chills, weight loss/gain, headaches, insomnia,  night sweats or change in appetite. Does c/o fatigue. Eyes: Denies redness, blurred vision, diplopia, discharge, itchy or watery eyes.  ENT: Denies discharge, congestion, post nasal drip, epistaxis, sore throat, earache, hearing loss, dental pain, Tinnitus, Vertigo, Sinus pain or snoring.  Cardio: Denies chest pain, palpitations, irregular heartbeat, syncope, dyspnea, diaphoresis, orthopnea, PND, claudication or edema Respiratory: denies cough, dyspnea, DOE, pleurisy, hoarseness, laryngitis or wheezing.  Gastrointestinal: Denies dysphagia, heartburn, reflux, water brash, pain, cramps, nausea, vomiting, bloating, diarrhea, constipation, hematemesis, melena, hematochezia, jaundice or hemorrhoids Genitourinary: Denies dysuria, frequency, urgency, nocturia, hesitancy, discharge, hematuria or flank pain Musculoskeletal: Denies arthralgia, myalgia, stiffness, Jt. Swelling, pain, limp or strain/sprain. Denies Falls. Skin: Denies puritis, rash, hives, warts, acne, eczema or change in skin lesion Neuro: No weakness, tremor, incoordination, spasms, paresthesia or pain Psychiatric: Denies confusion, memory loss or sensory loss. Denies Depression. Endocrine: Denies  change in weight, skin, hair change, nocturia, and paresthesia, diabetic polys, visual blurring or hyper / hypo glycemic episodes.  Heme/Lymph: No excessive bleeding, bruising or enlarged lymph nodes.  Physical Exam  BP 138/84   Pulse 72   Temp (!) 97.3 F (36.3 C)   Resp 18   Ht 5' 9.5" (1.765 m)   Wt 219 lb (99.3 kg)   BMI 31.88 kg/m   General Appearance: Well nourished and well groomed and in no apparent distress.  Eyes: PERRLA, EOMs, conjunctiva no swelling or erythema, normal fundi and vessels. Sinuses: No frontal/maxillary tenderness ENT/Mouth: EACs patent / TMs  nl. Nares clear without erythema, swelling, mucoid exudates. Oral hygiene is good. No erythema, swelling, or exudate. Tongue normal, non-obstructing. Tonsils not swollen or erythematous. Hearing normal.  Neck: Supple, thyroid normal. No bruits, nodes or JVD. Respiratory: Respiratory effort normal.  BS equal and clear bilateral without rales, rhonci, wheezing or stridor. Cardio: Heart sounds are normal with regular rate and rhythm and no murmurs, rubs or gallops. Peripheral pulses are normal and equal bilaterally without edema. No aortic or femoral bruits. Chest: symmetric with normal excursions and percussion.  Abdomen: Soft, with Nl bowel sounds. Nontender, no guarding, rebound, hernias, masses, or organomegaly.  Lymphatics: Non tender without lymphadenopathy.  Genitourinary: DRE - deferred age. Musculoskeletal: Full ROM all peripheral extremities, joint stability, 5/5 strength, and normal gait. Skin: Warm and dry without rashes, lesions, cyanosis, clubbing or  ecchymosis.  Neuro: Cranial nerves intact, reflexes equal bilaterally. Normal muscle tone, no cerebellar symptoms. Sensation intact to touch, vibratory and Monofilament to the toes bilaterally. Pysch: Alert and oriented X 3 with normal affect, insight and judgment appropriate.   Assessment and Plan  1. Annual Preventative/Screening Exam   2. Essential  hypertension  - EKG 12-Lead - [place], RETROPERITNL ABD,  LTD - Urinalysis, Routine w reflex microscopic - Microalbumin / creatinine urine ratio - CBC with Differential/Platelet - BASIC METABOLIC PANEL WITH GFR - Magnesium  3. Hyperlipidemia, mixed  - EKG 12-Lead - [place], RETROPERITNL ABD,  LTD - Hepatic function panel - Lipid panel - TSH  4. Insulin-requiring or dependent type II diabetes mellitus ([place])  - EKG 12-Lead - [place], RETROPERITNL ABD,  LTD - HM DIABETES FOOT EXAM - LOW EXTREMITY NEUR EXAM DOCUM - Hemoglobin A1c  5. Vitamin D deficiency  - VITAMIN D 25 Hydroxy  6. Type 2 DM with CKD3 with long-term current use of insulin (HCC)  - Hemoglobin A1c  7. Diabetic retinopathy  associated with diabetes mellitus - Hemoglobin A1c  8.  SDAT (senile dementia of Alzheimer's type)   9. Chronic diastolic CHF (congestive heart failure) (HCC)  - POC Hemoccult Bld/Stl  10. Prostate cancer ([place])  - PSA  11. Screening for colorectal cancer   12. Screening for ischemic heart disease  - EKG 12-Lead  13. Screening for AAA (aortic abdominal aneurysm)  - [place], RETROPERITNL ABD,  LTD  14. Medication management  - Urinalysis, Routine w reflex microscopic - Microalbumin / creatinine urine ratio  15. Gout  - Uric acid      Patient was counseled in prudent diet, weight control to achieve/maintain BMI less than 25, BP monitoring, regular exercise and medications as discussed.  Discussed med effects and SE's. Routine screening labs and tests as requested with regular follow-up as recommended. Over 40 minutes of exam, counseling, chart review and high complex critical decision making was performed

## 2017-02-16 ENCOUNTER — Ambulatory Visit (INDEPENDENT_AMBULATORY_CARE_PROVIDER_SITE_OTHER): Payer: Medicare Other | Admitting: Internal Medicine

## 2017-02-16 VITALS — BP 138/84 | HR 72 | Temp 97.3°F | Resp 18 | Ht 69.5 in | Wt 219.0 lb

## 2017-02-16 DIAGNOSIS — E559 Vitamin D deficiency, unspecified: Secondary | ICD-10-CM

## 2017-02-16 DIAGNOSIS — E119 Type 2 diabetes mellitus without complications: Secondary | ICD-10-CM

## 2017-02-16 DIAGNOSIS — Z79899 Other long term (current) drug therapy: Secondary | ICD-10-CM

## 2017-02-16 DIAGNOSIS — F0280 Dementia in other diseases classified elsewhere without behavioral disturbance: Secondary | ICD-10-CM

## 2017-02-16 DIAGNOSIS — G301 Alzheimer's disease with late onset: Secondary | ICD-10-CM

## 2017-02-16 DIAGNOSIS — I5032 Chronic diastolic (congestive) heart failure: Secondary | ICD-10-CM

## 2017-02-16 DIAGNOSIS — Z1211 Encounter for screening for malignant neoplasm of colon: Secondary | ICD-10-CM

## 2017-02-16 DIAGNOSIS — E782 Mixed hyperlipidemia: Secondary | ICD-10-CM

## 2017-02-16 DIAGNOSIS — Z794 Long term (current) use of insulin: Secondary | ICD-10-CM

## 2017-02-16 DIAGNOSIS — E1122 Type 2 diabetes mellitus with diabetic chronic kidney disease: Secondary | ICD-10-CM

## 2017-02-16 DIAGNOSIS — Z0001 Encounter for general adult medical examination with abnormal findings: Secondary | ICD-10-CM

## 2017-02-16 DIAGNOSIS — M109 Gout, unspecified: Secondary | ICD-10-CM

## 2017-02-16 DIAGNOSIS — C61 Malignant neoplasm of prostate: Secondary | ICD-10-CM

## 2017-02-16 DIAGNOSIS — Z136 Encounter for screening for cardiovascular disorders: Secondary | ICD-10-CM | POA: Diagnosis not present

## 2017-02-16 DIAGNOSIS — N183 Chronic kidney disease, stage 3 unspecified: Secondary | ICD-10-CM

## 2017-02-16 DIAGNOSIS — Z0000 Encounter for general adult medical examination without abnormal findings: Secondary | ICD-10-CM | POA: Diagnosis not present

## 2017-02-16 DIAGNOSIS — Z1212 Encounter for screening for malignant neoplasm of rectum: Secondary | ICD-10-CM

## 2017-02-16 DIAGNOSIS — I10 Essential (primary) hypertension: Secondary | ICD-10-CM

## 2017-02-16 DIAGNOSIS — E08319 Diabetes mellitus due to underlying condition with unspecified diabetic retinopathy without macular edema: Secondary | ICD-10-CM

## 2017-02-16 NOTE — Patient Instructions (Signed)

## 2017-02-17 LAB — CBC WITH DIFFERENTIAL/PLATELET
BASOS PCT: 1.1 %
Basophils Absolute: 59 cells/uL (ref 0–200)
Eosinophils Absolute: 167 cells/uL (ref 15–500)
Eosinophils Relative: 3.1 %
HEMATOCRIT: 36.2 % — AB (ref 38.5–50.0)
HEMOGLOBIN: 12.1 g/dL — AB (ref 13.2–17.1)
LYMPHS ABS: 2047 {cells}/uL (ref 850–3900)
MCH: 29.1 pg (ref 27.0–33.0)
MCHC: 33.4 g/dL (ref 32.0–36.0)
MCV: 87 fL (ref 80.0–100.0)
MONOS PCT: 9.8 %
MPV: 13.4 fL — AB (ref 7.5–12.5)
NEUTROS ABS: 2597 {cells}/uL (ref 1500–7800)
Neutrophils Relative %: 48.1 %
Platelets: 168 10*3/uL (ref 140–400)
RBC: 4.16 10*6/uL — AB (ref 4.20–5.80)
RDW: 12.1 % (ref 11.0–15.0)
Total Lymphocyte: 37.9 %
WBC mixed population: 529 cells/uL (ref 200–950)
WBC: 5.4 10*3/uL (ref 3.8–10.8)

## 2017-02-17 LAB — LIPID PANEL
Cholesterol: 143 mg/dL (ref <200)
HDL: 41 mg/dL (ref >40)
LDL CHOLESTEROL (CALC): 75 mg/dL
NON-HDL CHOLESTEROL (CALC): 102 mg/dL (ref <130)
TRIGLYCERIDES: 176 mg/dL — AB (ref <150)
Total CHOL/HDL Ratio: 3.5 (calc) (ref <5.0)

## 2017-02-17 LAB — URINALYSIS, ROUTINE W REFLEX MICROSCOPIC
BACTERIA UA: NONE SEEN /HPF
BILIRUBIN URINE: NEGATIVE
Glucose, UA: NEGATIVE
HYALINE CAST: NONE SEEN /LPF
Hgb urine dipstick: NEGATIVE
Ketones, ur: NEGATIVE
Leukocytes, UA: NEGATIVE
NITRITE: NEGATIVE
RBC / HPF: NONE SEEN /HPF (ref 0–2)
SQUAMOUS EPITHELIAL / LPF: NONE SEEN /HPF (ref <=5)
Specific Gravity, Urine: 1.011 (ref 1.001–1.03)
WBC, UA: NONE SEEN /HPF (ref 0–5)
pH: 6 (ref 5.0–8.0)

## 2017-02-17 LAB — TSH: TSH: 1.63 mIU/L (ref 0.40–4.50)

## 2017-02-17 LAB — BASIC METABOLIC PANEL WITH GFR
BUN / CREAT RATIO: 14 (calc) (ref 6–22)
BUN: 31 mg/dL — AB (ref 7–25)
CALCIUM: 9.3 mg/dL (ref 8.6–10.3)
CO2: 24 mmol/L (ref 20–32)
Chloride: 103 mmol/L (ref 98–110)
Creat: 2.15 mg/dL — ABNORMAL HIGH (ref 0.70–1.11)
GFR, EST AFRICAN AMERICAN: 31 mL/min/{1.73_m2} — AB (ref >=60)
GFR, EST NON AFRICAN AMERICAN: 26 mL/min/{1.73_m2} — AB (ref >=60)
Glucose, Bld: 194 mg/dL — ABNORMAL HIGH (ref 65–99)
POTASSIUM: 3.4 mmol/L — AB (ref 3.5–5.3)
Sodium: 138 mmol/L (ref 135–146)

## 2017-02-17 LAB — MICROALBUMIN / CREATININE URINE RATIO
CREATININE, URINE: 84 mg/dL (ref 20–370)
MICROALB UR: 10.5 mg/dL
Microalb Creat Ratio: 125 mcg/mg creat — ABNORMAL HIGH (ref <30)

## 2017-02-17 LAB — HEPATIC FUNCTION PANEL
AG Ratio: 1.6 (calc) (ref 1.0–2.5)
ALBUMIN MSPROF: 4 g/dL (ref 3.6–5.1)
ALT: 24 U/L (ref 9–46)
AST: 26 U/L (ref 10–35)
Alkaline phosphatase (APISO): 55 U/L (ref 40–115)
BILIRUBIN DIRECT: 0.1 mg/dL (ref 0.0–0.2)
BILIRUBIN TOTAL: 0.4 mg/dL (ref 0.2–1.2)
GLOBULIN: 2.5 g/dL (ref 1.9–3.7)
Indirect Bilirubin: 0.3 mg/dL (calc) (ref 0.2–1.2)
Total Protein: 6.5 g/dL (ref 6.1–8.1)

## 2017-02-17 LAB — HEMOGLOBIN A1C
Hgb A1c MFr Bld: 8.6 % of total Hgb — ABNORMAL HIGH (ref <5.7)
MEAN PLASMA GLUCOSE: 200 (calc)
eAG (mmol/L): 11.1 (calc)

## 2017-02-17 LAB — VITAMIN D 25 HYDROXY (VIT D DEFICIENCY, FRACTURES): Vit D, 25-Hydroxy: 82 ng/mL (ref 30–100)

## 2017-02-17 LAB — MAGNESIUM: Magnesium: 2.1 mg/dL (ref 1.5–2.5)

## 2017-02-17 LAB — PSA: PSA: 0.5 ng/mL (ref <=4.0)

## 2017-02-17 LAB — URIC ACID: Uric Acid, Serum: 6 mg/dL (ref 4.0–8.0)

## 2017-02-18 ENCOUNTER — Encounter: Payer: Self-pay | Admitting: Internal Medicine

## 2017-02-24 ENCOUNTER — Other Ambulatory Visit: Payer: Self-pay | Admitting: Internal Medicine

## 2017-03-07 ENCOUNTER — Ambulatory Visit (INDEPENDENT_AMBULATORY_CARE_PROVIDER_SITE_OTHER): Payer: Medicare Other | Admitting: Endocrinology

## 2017-03-07 ENCOUNTER — Encounter: Payer: Self-pay | Admitting: Endocrinology

## 2017-03-07 VITALS — BP 160/70 | HR 60 | Wt 220.2 lb

## 2017-03-07 DIAGNOSIS — E118 Type 2 diabetes mellitus with unspecified complications: Secondary | ICD-10-CM

## 2017-03-07 MED ORDER — INSULIN DETEMIR 100 UNIT/ML FLEXPEN: PEN_INJECTOR | SUBCUTANEOUS | 2 refills | Status: DC

## 2017-03-07 NOTE — Progress Notes (Signed)
Subjective:    Patient ID: Gregory Hatfield, male    DOB: 01/05/1926, 83 y.o.   MRN: [id number]  HPI Pt returns for f/u of diabetes mellitus: DM type: Insulin-requiring type 2 Dx'ed: 8588 Complications: renal insuff, retinopathy, CAD, and CHF.   Therapy: insulin since 2003 DKA: never Severe hypoglycemia: never.   Pancreatitis: never. Other: he is on a QD insulin regimen, due to noncompliance with multiple daily injections.   Interval history: He takes 50 units qam and 25 units qpm.  He brings a record of his cbg's which I have reviewed today.  It varies from 90-442.  There is no trend throughout the day.  He has gained weight.  He says this is due to eating at HS, due to PM insulin.  Past Medical History:  Diagnosis Date  . Bradycardia 10/06/2016  . CHF (congestive heart failure) ([place])   . CKD stage 4 due to type 2 diabetes mellitus ([place]) 06/03/2015  . Dyslipidemia associated with type 2 diabetes mellitus ([place]) 05/05/2015  . Essential hypertension 05/05/2015  . IDDM (insulin dependent diabetes mellitus) ([place])   . SDAT (senile dementia of Alzheimer's type) 06/16/2013  . Uncontrolled insulin-dependent diabetes mellitus with renal manifestation ([place]) 05/05/2015    Past Surgical History:  Procedure Laterality Date  . CARDIAC CATHETERIZATION  04/2004   normal r-sided pressure, mild pulm HTN  . CARPAL TUNNEL RELEASE Right   . CERVICAL SPINE SURGERY  1990's  . LUMBAR SPINE SURGERY  2003  . PROSTATE SURGERY  10/2010   transurethral resection   . TOTAL HIP ARTHROPLASTY Left 01/17/2014   Procedure: LEFT TOTAL HIP ARTHROPLASTY;  Surgeon: Yvette Rack., MD;  Location: [place];  Service: Orthopedics;  Laterality: Left;  . TRANSTHORACIC ECHOCARDIOGRAM  05/2006   EF 60-70%; mild calcif of MV; mild MV regurg; LA mildly dilated  . TRANSURETHRAL RESECTION OF PROSTATE N/A 08/22/2014   Procedure: TRANSURETHRAL RESECTION OF THE PROSTATE (TURP);  Surgeon: Marcia Brash, MD;  Location[facility];   Service: Urology;  Laterality: N/A;    Social History   Social History  . Marital status: Widowed    Spouse name: N/A  . Number of children: 5  . Years of education: master's   Occupational History  . teacher/instructor Retired   Social History Main Topics  . Smoking status: Former Smoker    Quit date: 06/21/1983  . Smokeless tobacco: Never Used  . Alcohol use No  . Drug use: No  . Sexual activity: No   Other Topics Concern  . Not on file   Social History Narrative  . No narrative on file    Current Outpatient Prescriptions on File Prior to Visit  Medication Sig Dispense Refill  . amLODipine (NORVASC) 5 MG tablet Take 1 tablet (5 mg total) by mouth daily. 90 tablet 3  . aspirin 81 MG tablet Take 81 mg by mouth daily.    . carvedilol (COREG) 6.25 MG tablet TAKE 1 TABLET TWICE DAILY WITH A MEAL. 180 tablet 1  . Cholecalciferol (VITAMIN D3) 5000 UNITS CAPS Take 5,000 Units by mouth 2 (two) times daily.     Marland Kitchen dextromethorphan-guaiFENesin (MUCINEX DM) 30-600 MG 12hr tablet Take 1 tablet by mouth 2 (two) times daily.    . furosemide (LASIX) 40 MG tablet TAKE 1 OR 2 TABLETS ONCE DAILY FOR BLOOD PRESSURE AND ANKLE SWELLING. 180 tablet 1  . hydrALAZINE (APRESOLINE) 50 MG tablet Take 1 tablet (50 mg total) by mouth 3 (three) times daily. [place]  tablet 3  . hyoscyamine (LEVSIN/SL) 0.125 MG SL tablet Place 1 tablet (0.125 mg total) under the tongue every 4 (four) hours as needed. 30 tablet 0  . LUMIGAN 0.01 % SOLN Place 1 drop into the right eye at bedtime.     . Multiple Vitamin (MULTIVITAMIN) tablet Take 1 tablet by mouth daily.      Marland Kitchen MYRBETRIQ 50 MG TB24 tablet Take 50 mg by mouth daily.     . ONE TOUCH ULTRA TEST test strip CHECK BLOOD SUGAR 6 TIMES A DAY. 540 each 3  . pravastatin (PRAVACHOL) 40 MG tablet TAKE 1 TABLET ONCE DAILY FOR CHOLESTEROL. 30 tablet 3  . Probiotic Product (PROBIOTIC DAILY PO) Take 1 tablet by mouth daily.     Marland Kitchen senna-docusate (SENOKOT-S) 8.6-50 MG tablet Take  1 tablet by mouth at bedtime. (Patient taking differently: Take 1 tablet by mouth daily. )    . SIMBRINZA 1-0.2 % SUSP Place 1 drop into both eyes 3 (three) times daily.     No current facility-administered medications on file prior to visit.     Allergies  Allergen Reactions  . Ace Inhibitors Other (See Comments) and Cough    Chronic cough  . Demerol Other (See Comments)    Hard to wake up  . Cosopt [Dorzolamide Hcl-Timolol Mal] Other (See Comments)    Cause heart rate to drop  . Flomax [Tamsulosin Hcl] Other (See Comments)    Low BP  . Morphine And Related Other (See Comments)    hallucinations  . Oxytrol [Oxybutynin] Rash    Family History  Problem Relation Age of Onset  . Diabetes Mother   . Hypertension Mother   . Prostate cancer Father   . Heart disease Father   . Diabetes Brother   . Cancer Brother   . Heart disease Brother   . Diabetes Sister   . Heart disease Sister   . Hyperlipidemia Child   . Hypertension Child   . Diabetes Child   . Heart attack Child   . Cancer Child     BP (!) 160/70   Pulse 60   Wt 220 lb 3.2 oz (99.9 kg)   SpO2 93%   BMI 32.05 kg/m    Review of Systems He denies hypoglycemia.        Objective:   Physical Exam VITAL SIGNS:  See vs page.  GENERAL: no distress.  Pulses: foot pulses are intact bilaterally.   MSK: no deformity of the feet or ankles.  CV: 1+ bilat edema of the legs Skin:  no ulcer on the feet or ankles.  normal temp on the feet and ankles. There is spotty hyperpigmentation and scaling on the legs and feet Neuro: sensation is intact to touch on the feet and ankles.   Ext: There is bilateral onychomycosis of the toenails   Lab Results  Component Value Date   HGBA1C 8.6 (H) 02/16/2017      Assessment & Plan:  Insulin-requiring type 2 DM, with DR.  Frail elderly state: he is not a candidate for aggressive glycemic control. Weight gain: due to increased insulin.   Patient Instructions  check your blood  sugar 6 times a day--before the 3 meals, and at bedtime.  also check if you have symptoms of your blood sugar being too high or too low.  please keep a record of the readings and bring it to your next appointment here.  please call us sooner if you are having low blood sugar episodes.  On this type of insulin schedule, you should eat meals on a regular schedule.  If a meal is missed or significantly delayed, your blood sugar could go low.   Please continue the same insulin.   Please come back for a follow-up appointment in 2 months.

## 2017-03-07 NOTE — Patient Instructions (Addendum)
check your blood sugar 6 times a day--before the 3 meals, and at bedtime.  also check if you have symptoms of your blood sugar being too high or too low.  please keep a record of the readings and bring it to your next appointment here.  please call us sooner if you are having low blood sugar episodes.   On this type of insulin schedule, you should eat meals on a regular schedule.  If a meal is missed or significantly delayed, your blood sugar could go low.   Please continue the same insulin.   Please come back for a follow-up appointment in 2 months.

## 2017-04-04 ENCOUNTER — Other Ambulatory Visit: Payer: Self-pay

## 2017-04-04 DIAGNOSIS — I5032 Chronic diastolic (congestive) heart failure: Secondary | ICD-10-CM

## 2017-04-04 LAB — POC HEMOCCULT BLD/STL (HOME/3-CARD/SCREEN)
Card #2 Fecal Occult Blod, POC: NEGATIVE
FECAL OCCULT BLD: NEGATIVE
FECAL OCCULT BLD: NEGATIVE

## 2017-04-11 ENCOUNTER — Ambulatory Visit (INDEPENDENT_AMBULATORY_CARE_PROVIDER_SITE_OTHER): Payer: Medicare Other | Admitting: Sports Medicine

## 2017-04-11 DIAGNOSIS — E119 Type 2 diabetes mellitus without complications: Secondary | ICD-10-CM

## 2017-04-11 DIAGNOSIS — M79676 Pain in unspecified toe(s): Secondary | ICD-10-CM

## 2017-04-11 DIAGNOSIS — B351 Tinea unguium: Secondary | ICD-10-CM

## 2017-04-11 DIAGNOSIS — M79671 Pain in right foot: Secondary | ICD-10-CM

## 2017-04-11 DIAGNOSIS — M79672 Pain in left foot: Secondary | ICD-10-CM

## 2017-04-11 NOTE — Progress Notes (Signed)
Patient ID: Gregory Hatfield, male   DOB: 1925/07/10, 83 y.o.   MRN: [id number]  Subjective: Gregory Hatfield is a 83 y.o. male patient with history of type 2 diabetes who presents to office today complaining of long, painful nails while ambulating in shoes; unable to trim. Patient denies any other new problems.   FBS today was 199  A1c 8.7   Patient Active Problem List   Diagnosis Date Noted  . Symptomatic bradycardia 10/06/2016  . Diabetes mellitus with complication ([place]) 76/28/3151  . AKI (acute kidney injury) ([place]) 10/06/2016  . Chronic diastolic CHF (congestive heart failure) ([place]) 10/06/2016  . Hyperlipidemia 01/12/2016  . Prostate cancer ([place]) 10/12/2015  . Diabetic retinopathy ([place]) 10/12/2015  . Encounter for Medicare annual wellness exam 10/12/2015  . Open-angle glaucoma 07/06/2015  . Vitamin D deficiency 06/03/2015  . Medication management 06/03/2015  . Stage 3 chronic kidney disease ([place]) 06/03/2015  . Insulin-requiring or dependent type II diabetes mellitus ([place]) 05/05/2015  . Essential hypertension 05/05/2015  . SDAT (senile dementia of Alzheimer's type) 06/16/2013   Current Outpatient Prescriptions on File Prior to Visit  Medication Sig Dispense Refill  . amLODipine (NORVASC) 5 MG tablet Take 1 tablet (5 mg total) by mouth daily. 90 tablet 3  . aspirin 81 MG tablet Take 81 mg by mouth daily.    . carvedilol (COREG) 6.25 MG tablet TAKE 1 TABLET TWICE DAILY WITH A MEAL. 180 tablet 1  . Cholecalciferol (VITAMIN D3) 5000 UNITS CAPS Take 5,000 Units by mouth 2 (two) times daily.     Marland Kitchen dextromethorphan-guaiFENesin (MUCINEX DM) 30-600 MG 12hr tablet Take 1 tablet by mouth 2 (two) times daily.    . furosemide (LASIX) 40 MG tablet TAKE 1 OR 2 TABLETS ONCE DAILY FOR BLOOD PRESSURE AND ANKLE SWELLING. 180 tablet 1  . hydrALAZINE (APRESOLINE) 50 MG tablet Take 1 tablet (50 mg total) by mouth 3 (three) times daily. 90 tablet 3  . hyoscyamine (LEVSIN/SL) 0.125 MG SL tablet Place  1 tablet (0.125 mg total) under the tongue every 4 (four) hours as needed. 30 tablet 0  . Insulin Detemir (LEVEMIR FLEXTOUCH) 100 UNIT/ML Pen 50 units in the am and 25 units in the pm 30 mL 2  . LUMIGAN 0.01 % SOLN Place 1 drop into the right eye at bedtime.     . Multiple Vitamin (MULTIVITAMIN) tablet Take 1 tablet by mouth daily.      Marland Kitchen MYRBETRIQ 50 MG TB24 tablet Take 50 mg by mouth daily.     . ONE TOUCH ULTRA TEST test strip CHECK BLOOD SUGAR 6 TIMES A DAY. 540 each 3  . pravastatin (PRAVACHOL) 40 MG tablet TAKE 1 TABLET ONCE DAILY FOR CHOLESTEROL. 30 tablet 3  . Probiotic Product (PROBIOTIC DAILY PO) Take 1 tablet by mouth daily.     Marland Kitchen senna-docusate (SENOKOT-S) 8.6-50 MG tablet Take 1 tablet by mouth at bedtime. (Patient taking differently: Take 1 tablet by mouth daily. )    . SIMBRINZA 1-0.2 % SUSP Place 1 drop into both eyes 3 (three) times daily.     No current facility-administered medications on file prior to visit.    Allergies  Allergen Reactions  . Ace Inhibitors Other (See Comments) and Cough    Chronic cough  . Demerol Other (See Comments)    Hard to wake up  . Cosopt [Dorzolamide Hcl-Timolol Mal] Other (See Comments)    Cause heart rate to drop  . Flomax [Tamsulosin Hcl] Other (See Comments)  Low BP  . Morphine And Related Other (See Comments)    hallucinations  . Oxytrol [Oxybutynin] Rash    Objective: General: Patient is awake, alert, and oriented x 3 and in no acute distress.  Integument: Skin is warm, dry and supple bilateral. Nails are tender, long, thickened and dystrophic with subungual debris, consistent with onychomycosis, 1-5 bilateral. No signs of infection. No open lesions or preulcerative lesions present bilateral. Remaining integument unremarkable.  Vasculature:  Dorsalis Pedis pulse 1/4 bilateral. Posterior Tibial pulse  0/4 bilateral. No ischemia or gangrene. Capillary fill time <3 sec 1-5 bilateral. Positive hair growth to the level of the  digits.Temperature gradient within normal limits. No varicosities present bilateral. Trace edema present bilateral.   Neurology: The patient has intact sensation measured with a 5.07/10g Semmes Weinstein Monofilament at all pedal sites bilateral . Vibratory sensation diminished bilateral with tuning fork. No Babinski sign present bilateral.   Musculoskeletal: No symptomatic pedal deformities noted bilateral. Muscular strength 5/5 in all lower extremity muscular groups bilateral without pain or limitation on range of motion. No tenderness with calf compression bilateral.  Assessment and Plan: Problem List Items Addressed This Visit    None    Visit Diagnoses    Pain due to onychomycosis of toenail    -  Primary   Diabetes mellitus without complication (HCC)       Foot pain, bilateral         -Examined patient. -Discussed and educated patient on diabetic foot care, especially with  regards to the vascular, neurological and musculoskeletal systems.  -Stressed the importance of good glycemic control and the detriment of not  controlling glucose levels in relation to the foot. -Mechanically debrided all nails 1-5 bilateral using sterile nail nipper and filed with dremel without incident  -Answered all patient questions -Patient to return in 3 months for at risk foot care -Patient advised to call the office if any problems or questions arise in the meantime.  Landis Martins, DPM

## 2017-04-14 ENCOUNTER — Other Ambulatory Visit: Payer: Self-pay | Admitting: Endocrinology

## 2017-05-04 ENCOUNTER — Ambulatory Visit: Payer: Medicare Other

## 2017-05-04 NOTE — Progress Notes (Unsigned)
Patient ID: Gregory Hatfield                 DOB: May 28, 1926                      MRN: [id number]     HPI: Gregory Hatfield is a 83 y.o. male referred by Dr. Debara Pickett to HTN clinic. PMH includes symptomatic bradycardia, CHF, DM, hypertension, CKD, and dyslipidemia.  He was also hospitalized earlier this year with bradycardia and possible confusion with medications.  Now that he has been seen 3 times in the hypertension clinic, he seems to have a better grasp on his medications.  He is here today with his med aide, and she reports that he now has a "system" to remember to take his medications, and he does quite well with it. Even remembers mid-day hydralazine dose without any problems.    No complaints about his health today, no dizziness, chest pain or shortness of breath.  He does endorse occasional lower extremity edema, but not enough to be bothersome at this time.  He gets many of his meals from [employer], so he cannot control his diet too much.  Because of this he has a difficult time with his blood sugars, which seem to spike and drop regularly (range last week 88-413)  Current HTN meds:  Amlodipine 5mg  daily Carvedilol 6.25mg  twice daily Hydralazine 25mg  three times a day Furosemide 40mg  twice daily  BP goal: < 140/90   Family History: Diabetes and hypertension from mother;cancer and heart disease from father; diabetes and heart disease siblings, and hyperlipidemia, hypertension, DM and cancer in cildren.  Social History: reports that he quit smoking about 33 years ago. He has never used smokeless tobacco. He reports that he does not drink alcohol or use drugs  Coffee tid with meals, no other caffeine  Diet:  Gets meals on wheels; does admit to adding salt tot many foods  Home BP readings:  Intolerances: ACEI caused cough   Wt Readings from Last 3 Encounters:  03/07/17 220 lb 3.2 oz (99.9 kg)  02/16/17 219 lb (99.3 kg)  12/29/16 213 lb 9.6 oz (96.9 kg)   BP Readings  from Last 3 Encounters:  03/07/17 (!) 160/70  02/18/17 138/84  02/14/17 (!) 112/58   Pulse Readings from Last 3 Encounters:  03/07/17 60  02/16/17 72  02/14/17 72    Past Medical History:  Diagnosis Date  . Bradycardia 10/06/2016  . CHF (congestive heart failure) ([place])   . CKD stage 4 due to type 2 diabetes mellitus ([place]) 06/03/2015  . Dyslipidemia associated with type 2 diabetes mellitus ([place]) 05/05/2015  . Essential hypertension 05/05/2015  . IDDM (insulin dependent diabetes mellitus) ([place])   . SDAT (senile dementia of Alzheimer's type) 06/16/2013  . Uncontrolled insulin-dependent diabetes mellitus with renal manifestation ([place]) 05/05/2015    Current Outpatient Medications on File Prior to Visit  Medication Sig Dispense Refill  . amLODipine (NORVASC) 5 MG tablet Take 1 tablet (5 mg total) by mouth daily. 90 tablet 3  . aspirin 81 MG tablet Take 81 mg by mouth daily.    . B-D UF III MINI PEN NEEDLES 31G X 5 MM MISC USE FOR INJECTIONS AS DIRECTED 100 each 0  . carvedilol (COREG) 6.25 MG tablet TAKE 1 TABLET TWICE DAILY WITH A MEAL. 180 tablet 1  . Cholecalciferol (VITAMIN D3) 5000 UNITS CAPS Take 5,000 Units by mouth 2 (two) times daily.     Marland Kitchen  dextromethorphan-guaiFENesin (MUCINEX DM) 30-600 MG 12hr tablet Take 1 tablet by mouth 2 (two) times daily.    . furosemide (LASIX) 40 MG tablet TAKE 1 OR 2 TABLETS ONCE DAILY FOR BLOOD PRESSURE AND ANKLE SWELLING. 180 tablet 1  . hydrALAZINE (APRESOLINE) 50 MG tablet Take 1 tablet (50 mg total) by mouth 3 (three) times daily. 90 tablet 3  . hyoscyamine (LEVSIN/SL) 0.125 MG SL tablet Place 1 tablet (0.125 mg total) under the tongue every 4 (four) hours as needed. 30 tablet 0  . Insulin Detemir (LEVEMIR FLEXTOUCH) 100 UNIT/ML Pen 50 units in the am and 25 units in the pm 30 mL 2  . LUMIGAN 0.01 % SOLN Place 1 drop into the right eye at bedtime.     . Multiple Vitamin (MULTIVITAMIN) tablet Take 1 tablet by mouth daily.      Marland Kitchen MYRBETRIQ 50 MG  TB24 tablet Take 50 mg by mouth daily.     . ONE TOUCH ULTRA TEST test strip CHECK BLOOD SUGAR 6 TIMES A DAY. 540 each 3  . pravastatin (PRAVACHOL) 40 MG tablet TAKE 1 TABLET ONCE DAILY FOR CHOLESTEROL. 30 tablet 3  . Probiotic Product (PROBIOTIC DAILY PO) Take 1 tablet by mouth daily.     Marland Kitchen senna-docusate (SENOKOT-S) 8.6-50 MG tablet Take 1 tablet by mouth at bedtime. (Patient taking differently: Take 1 tablet by mouth daily. )    . SIMBRINZA 1-0.2 % SUSP Place 1 drop into both eyes 3 (three) times daily.     No current facility-administered medications on file prior to visit.     Allergies  Allergen Reactions  . Ace Inhibitors Other (See Comments) and Cough    Chronic cough  . Demerol Other (See Comments)    Hard to wake up  . Cosopt [Dorzolamide Hcl-Timolol Mal] Other (See Comments)    Cause heart rate to drop  . Flomax [Tamsulosin Hcl] Other (See Comments)    Low BP  . Morphine And Related Other (See Comments)    hallucinations  . Oxytrol [Oxybutynin] Rash    There were no vitals taken for this visit.  Essential hypertension:      Malita Ignasiak Rodriguez-Guzman PharmD, BCPS, [facility] [address] [place] [address] 05/04/2017 1:28 PM

## 2017-05-08 NOTE — Progress Notes (Signed)
Patient ID: Gregory Hatfield                 DOB: 05/09/1926                      MRN: [id number]     HPI: Gregory Hatfield is a 83 y.o. male referred by Dr. Debara Hatfield to HTN clinic. PMH includes symptomatic bradycardia, CHF, DM, hypertension, CKD, and dyslipidemia.  He was also hospitalized earlier this year with bradycardia and possible confusion with medications.  Now that he has been seen 3 times in the hypertension clinic, he seems to have a better grasp on his medications.  He is here today with his med aide, and she reports that he now has a "system" to remember to take his medications, and he does quite well with it. Even remembers mid-day hydralazine dose without any problems.    No complaints about his health today, no dizziness, chest pain or shortness of breath.  He does endorse occasional lower extremity edema, but not enough to be bothersome at this time.  He gets many of his meals from [employer] Whe[employer], so he cannot control his diet too much.  Because of this he has a difficult time with his blood sugars, which seem to spike and drop regularly (range last week 88-413)  Current HTN meds:  Amlodipine 5mg  daily Carvedilol 6.25mg  twice daily Hydralazine 25mg  TID Furosemide 40mg  twice daily  BP goal: < 140/90   Family History: Diabetes and hypertension from mother;cancer and heart disease from father; diabetes and heart disease siblings, and hyperlipidemia, hypertension, DM and cancer in cildren.  Social History: reports that he quit smoking about 33 years ago. He has never used smokeless tobacco. He reports that he does not drink alcohol or use drugs  Coffee tid with meals, no other caffeine  Diet:  Gets meals on wheels; does admit to adding salt tot many foods; now eating wheat breads, pastas  Home BP readings: checks 4 times per day, home cuff is from [employer].  At his last visit we checked his machine and technique.  Read within 10 points of the office cuff.  Averages for last week  (27 readings) was 153/78, and this week his first 5 readings were all < 735 systolic.    Home readigns - only 1 > 160 in past weeks   Intolerances: ACEI caused cough  Wt Readings from Last 3 Encounters:  05/09/17 220 lb 12.8 oz (100.2 kg)  03/07/17 220 lb 3.2 oz (99.9 kg)  02/16/17 219 lb (99.3 kg)   BP Readings from Last 3 Encounters:  05/09/17 128/64  05/09/17 128/62  03/07/17 (!) 160/70   Pulse Readings from Last 3 Encounters:  05/09/17 62  05/09/17 68  03/07/17 60    Past Medical History:  Diagnosis Date  . Bradycardia 10/06/2016  . CHF (congestive heart failure) ([place])   . CKD stage 4 due to type 2 diabetes mellitus ([place]) 06/03/2015  . Dyslipidemia associated with type 2 diabetes mellitus ([place]) 05/05/2015  . Essential hypertension 05/05/2015  . IDDM (insulin dependent diabetes mellitus) ([place])   . SDAT (senile dementia of Alzheimer's type) 06/16/2013  . Uncontrolled insulin-dependent diabetes mellitus with renal manifestation ([place]) 05/05/2015    Current Outpatient Medications on File Prior to Visit  Medication Sig Dispense Refill  . amLODipine (NORVASC) 5 MG tablet Take 1 tablet (5 mg total) by mouth daily. 90 tablet 3  . aspirin 81 MG tablet Take 81 mg  by mouth daily.    . B-D UF III MINI PEN NEEDLES 31G X 5 MM MISC USE FOR INJECTIONS AS DIRECTED 100 each 0  . carvedilol (COREG) 6.25 MG tablet TAKE 1 TABLET TWICE DAILY WITH A MEAL. 180 tablet 1  . Cholecalciferol (VITAMIN D3) 5000 UNITS CAPS Take 5,000 Units by mouth 2 (two) times daily.     Marland Kitchen dextromethorphan-guaiFENesin (MUCINEX DM) 30-600 MG 12hr tablet Take 1 tablet by mouth 2 (two) times daily.    . furosemide (LASIX) 40 MG tablet TAKE 1 OR 2 TABLETS ONCE DAILY FOR BLOOD PRESSURE AND ANKLE SWELLING. 180 tablet 1  . hydrALAZINE (APRESOLINE) 50 MG tablet Take 1 tablet (50 mg total) by mouth 3 (three) times daily. 90 tablet 3  . hyoscyamine (LEVSIN/SL) 0.125 MG SL tablet Place 1 tablet (0.125 mg total) under the  tongue every 4 (four) hours as needed. 30 tablet 0  . Insulin Detemir (LEVEMIR FLEXTOUCH) 100 UNIT/ML Pen 50 units in the am and 25 units in the pm 30 mL 2  . LUMIGAN 0.01 % SOLN Place 1 drop into the right eye at bedtime.     . Multiple Vitamin (MULTIVITAMIN) tablet Take 1 tablet by mouth daily.      Marland Kitchen MYRBETRIQ 50 MG TB24 tablet Take 50 mg by mouth daily.     . ONE TOUCH ULTRA TEST test strip CHECK BLOOD SUGAR 6 TIMES A DAY. 540 each 3  . pravastatin (PRAVACHOL) 40 MG tablet TAKE 1 TABLET ONCE DAILY FOR CHOLESTEROL. 30 tablet 3  . Probiotic Product (PROBIOTIC DAILY PO) Take 1 tablet by mouth daily.     Marland Kitchen senna-docusate (SENOKOT-S) 8.6-50 MG tablet Take 1 tablet by mouth at bedtime. (Patient taking differently: Take 1 tablet by mouth daily. )    . SIMBRINZA 1-0.2 % SUSP Place 1 drop into both eyes 3 (three) times daily.     No current facility-administered medications on file prior to visit.     Allergies  Allergen Reactions  . Ace Inhibitors Other (See Comments) and Cough    Chronic cough  . Demerol Other (See Comments)    Hard to wake up  . Cosopt [Dorzolamide Hcl-Timolol Mal] Other (See Comments)    Cause heart rate to drop  . Flomax [Tamsulosin Hcl] Other (See Comments)    Low BP  . Morphine And Related Other (See Comments)    hallucinations  . Oxytrol [Oxybutynin] Rash    Blood pressure 128/62, pulse 68.  Essential hypertension:  Blood pressure well at goal in the office today despite continued elevated readings at home.  Readings from this week at home show a downward trend, has past 5-6 readings < 562 systolic.  With his poor balance and excellent office reading today, I will not make any medication changes.  He will continue with daily home BP checks and we will see him back in 3 months for a follow up visit.     Gregory Hatfield PharmD CPP [facility] [place] [address] 05/11/2017 9:26 AM

## 2017-05-09 ENCOUNTER — Encounter: Payer: Self-pay | Admitting: Endocrinology

## 2017-05-09 ENCOUNTER — Ambulatory Visit (INDEPENDENT_AMBULATORY_CARE_PROVIDER_SITE_OTHER): Payer: Medicare Other | Admitting: Pharmacist Clinician (PhC)/ Clinical Pharmacy Specialist

## 2017-05-09 ENCOUNTER — Ambulatory Visit: Payer: Medicare Other | Admitting: Endocrinology

## 2017-05-09 VITALS — BP 128/62 | HR 68

## 2017-05-09 VITALS — BP 128/64 | HR 62 | Wt 220.8 lb

## 2017-05-09 DIAGNOSIS — E118 Type 2 diabetes mellitus with unspecified complications: Secondary | ICD-10-CM

## 2017-05-09 DIAGNOSIS — I10 Essential (primary) hypertension: Secondary | ICD-10-CM

## 2017-05-09 LAB — POCT GLYCOSYLATED HEMOGLOBIN (HGB A1C): Hemoglobin A1C: 7.8

## 2017-05-09 NOTE — Progress Notes (Signed)
Subjective:    Patient ID: Gregory Hatfield, male    DOB: August 31, 1925, 83 y.o.   MRN: [id number]  HPI Pt returns for f/u of diabetes mellitus: DM type: Insulin-requiring type 2 Dx'ed: 0175 Complications: renal insuff, retinopathy, CAD, and CHF.   Therapy: insulin since 2003 DKA: never Severe hypoglycemia: never.   Pancreatitis: never. Other: he is on a BID insulin regimen, due to noncompliance with multiple daily injections.   Interval history: He takes 50 units qam and 25 units qpm.  He brings a record of his cbg's which I have reviewed today.  It varies from 80-300's.  There is no trend throughout the day.    Past Medical History:  Diagnosis Date  . Bradycardia 10/06/2016  . CHF (congestive heart failure) ([place])   . CKD stage 4 due to type 2 diabetes mellitus ([place]) 06/03/2015  . Dyslipidemia associated with type 2 diabetes mellitus ([place]) 05/05/2015  . Essential hypertension 05/05/2015  . IDDM (insulin dependent diabetes mellitus) ([place])   . SDAT (senile dementia of Alzheimer's type) 06/16/2013  . Uncontrolled insulin-dependent diabetes mellitus with renal manifestation ([place]) 05/05/2015    Past Surgical History:  Procedure Laterality Date  . CARDIAC CATHETERIZATION  04/2004   normal r-sided pressure, mild pulm HTN  . CARPAL TUNNEL RELEASE Right   . CERVICAL SPINE SURGERY  1990's  . LUMBAR SPINE SURGERY  2003  . PROSTATE SURGERY  10/2010   transurethral resection   . TOTAL HIP ARTHROPLASTY Left 01/17/2014   Procedure: LEFT TOTAL HIP ARTHROPLASTY;  Surgeon: Yvette Rack., MD;  Location: [facility];  Service: Orthopedics;  Laterality: Left;  . TRANSTHORACIC ECHOCARDIOGRAM  05/2006   EF 60-70%; mild calcif of MV; mild MV regurg; LA mildly dilated  . TRANSURETHRAL RESECTION OF PROSTATE N/A 08/22/2014   Procedure: TRANSURETHRAL RESECTION OF THE PROSTATE (TURP);  Surgeon: Marcia Brash, MD;  Location: [facility];  Service: Urology;  Laterality: N/A;    Social History   Socioeconomic  History  . Marital status: Widowed    Spouse name: Not on file  . Number of children: 5  . Years of education: master's  . Highest education level: Not on file  Social Needs  . Financial resource strain: Not on file  . Food insecurity - worry: Not on file  . Food insecurity - inability: Not on file  . Transportation needs - medical: Not on file  . Transportation needs - non-medical: Not on file  Occupational History  . Occupation: Therapist, music: RETIRED  Tobacco Use  . Smoking status: Former Smoker    Last attempt to quit: 06/21/1983    Years since quitting: 33.9  . Smokeless tobacco: Never Used  Substance and Sexual Activity  . Alcohol use: No  . Drug use: No  . Sexual activity: No  Other Topics Concern  . Not on file  Social History Narrative  . Not on file    Current Outpatient Medications on File Prior to Visit  Medication Sig Dispense Refill  . aspirin 81 MG tablet Take 81 mg by mouth daily.    . B-D UF III MINI PEN NEEDLES 31G X 5 MM MISC USE FOR INJECTIONS AS DIRECTED 100 each 0  . carvedilol (COREG) 6.25 MG tablet TAKE 1 TABLET TWICE DAILY WITH A MEAL. 180 tablet 1  . Cholecalciferol (VITAMIN D3) 5000 UNITS CAPS Take 5,000 Units by mouth 2 (two) times daily.     Marland Kitchen dextromethorphan-guaiFENesin (MUCINEX DM) 30-600 MG 12hr tablet  Take 1 tablet by mouth 2 (two) times daily.    . furosemide (LASIX) 40 MG tablet TAKE 1 OR 2 TABLETS ONCE DAILY FOR BLOOD PRESSURE AND ANKLE SWELLING. 180 tablet 1  . hyoscyamine (LEVSIN/SL) 0.125 MG SL tablet Place 1 tablet (0.125 mg total) under the tongue every 4 (four) hours as needed. 30 tablet 0  . Insulin Detemir (LEVEMIR FLEXTOUCH) 100 UNIT/ML Pen 50 units in the am and 25 units in the pm 30 mL 2  . LUMIGAN 0.01 % SOLN Place 1 drop into the right eye at bedtime.     . Multiple Vitamin (MULTIVITAMIN) tablet Take 1 tablet by mouth daily.      Marland Kitchen MYRBETRIQ 50 MG TB24 tablet Take 50 mg by mouth daily.     . ONE TOUCH ULTRA  TEST test strip CHECK BLOOD SUGAR 6 TIMES A DAY. 540 each 3  . pravastatin (PRAVACHOL) 40 MG tablet TAKE 1 TABLET ONCE DAILY FOR CHOLESTEROL. 30 tablet 3  . Probiotic Product (PROBIOTIC DAILY PO) Take 1 tablet by mouth daily.     Marland Kitchen senna-docusate (SENOKOT-S) 8.6-50 MG tablet Take 1 tablet by mouth at bedtime. (Patient taking differently: Take 1 tablet by mouth daily. )    . SIMBRINZA 1-0.2 % SUSP Place 1 drop into both eyes 3 (three) times daily.    Marland Kitchen amLODipine (NORVASC) 5 MG tablet Take 1 tablet (5 mg total) by mouth daily. 90 tablet 3  . hydrALAZINE (APRESOLINE) 50 MG tablet Take 1 tablet (50 mg total) by mouth 3 (three) times daily. 90 tablet 3   No current facility-administered medications on file prior to visit.     Allergies  Allergen Reactions  . Ace Inhibitors Other (See Comments) and Cough    Chronic cough  . Demerol Other (See Comments)    Hard to wake up  . Cosopt [Dorzolamide Hcl-Timolol Mal] Other (See Comments)    Cause heart rate to drop  . Flomax [Tamsulosin Hcl] Other (See Comments)    Low BP  . Morphine And Related Other (See Comments)    hallucinations  . Oxytrol [Oxybutynin] Rash    Family History  Problem Relation Age of Onset  . Diabetes Mother   . Hypertension Mother   . Prostate cancer Father   . Heart disease Father   . Diabetes Brother   . Cancer Brother   . Heart disease Brother   . Diabetes Sister   . Heart disease Sister   . Hyperlipidemia Child   . Hypertension Child   . Diabetes Child   . Heart attack Child   . Cancer Child     BP 128/64 (BP Location: Left Arm, Patient Position: Sitting, Cuff Size: Normal)   Pulse 62   Wt 220 lb 12.8 oz (100.2 kg)   SpO2 96%   BMI 32.14 kg/m    Review of Systems Denies LOC    Objective:   Physical Exam VITAL SIGNS:  See vs page.  GENERAL: no distress.  Pulses: foot pulses are intact bilaterally.   MSK: no deformity of the feet or ankles.  CV: 2+ bilat edema of the legs Skin:  no ulcer on  the feet or ankles.  normal temp on the feet and ankles. There is spotty hyperpigmentation and severe scaling on the legs and feet Neuro: sensation is intact to touch on the feet and ankles.   Ext: There is bilateral onychomycosis of the toenails.  A1c=7.6%     Assessment & Plan:  Insulin-requiring type  2 DM, with renal insuff: this is the best control this pt should aim for, given this regimen, which does match insulin to his changing needs throughout the day  Patient Instructions  check your blood sugar 6 times a day--before the 3 meals, and at bedtime.  also check if you have symptoms of your blood sugar being too high or too low.  please keep a record of the readings and bring it to your next appointment here.  please call us sooner if you are having low blood sugar episodes.   On this type of insulin schedule, you should eat meals on a regular schedule.  If a meal is missed or significantly delayed, your blood sugar could go low.  Please continue the same insulin.   Please come back for a follow-up appointment in 3-4 months.

## 2017-05-09 NOTE — Patient Instructions (Addendum)
check your blood sugar 6 times a day--before the 3 meals, and at bedtime.  also check if you have symptoms of your blood sugar being too high or too low.  please keep a record of the readings and bring it to your next appointment here.  please call us sooner if you are having low blood sugar episodes.   On this type of insulin schedule, you should eat meals on a regular schedule.  If a meal is missed or significantly delayed, your blood sugar could go low.  Please continue the same insulin.   Please come back for a follow-up appointment in 3-4 months.

## 2017-05-09 NOTE — Patient Instructions (Signed)
Happy Thanksgiving!  Your blood pressure today is 128/62  Check your blood pressure at home daily and keep record of the readings.  Take your BP meds as follows:  No change to your medications today.  Bring all of your meds, your BP cuff and your record of home blood pressures to your next appointment.  Exercise as you're able, try to walk approximately 30 minutes per day.  Keep salt intake to a minimum, especially watch canned and prepared boxed foods.  Eat more fresh fruits and vegetables and fewer canned items.  Avoid eating in fast food restaurants.    HOW TO TAKE YOUR BLOOD PRESSURE: . Rest 5 minutes before taking your blood pressure. .  Don't smoke or drink caffeinated beverages for at least 30 minutes before. . Take your blood pressure before (not after) you eat. . Sit comfortably with your back supported and both feet on the floor (don't cross your legs). . Elevate your arm to heart level on a table or a desk. . Use the proper sized cuff. It should fit smoothly and snugly around your bare upper arm. There should be enough room to slip a fingertip under the cuff. The bottom edge of the cuff should be 1 inch above the crease of the elbow. . Ideally, take 3 measurements at one sitting and record the average.

## 2017-05-11 ENCOUNTER — Encounter: Payer: Self-pay | Admitting: Pharmacist Clinician (PhC)/ Clinical Pharmacy Specialist

## 2017-05-22 ENCOUNTER — Other Ambulatory Visit: Payer: Self-pay | Admitting: Internal Medicine

## 2017-05-22 ENCOUNTER — Other Ambulatory Visit: Payer: Self-pay | Admitting: Endocrinology

## 2017-05-23 ENCOUNTER — Encounter: Payer: Self-pay | Admitting: Physician Assistant

## 2017-05-23 ENCOUNTER — Ambulatory Visit: Payer: Medicare Other | Admitting: Physician Assistant

## 2017-05-23 VITALS — BP 140/72 | HR 72 | Temp 97.3°F | Resp 16 | Ht 71.0 in | Wt 220.0 lb

## 2017-05-23 DIAGNOSIS — E119 Type 2 diabetes mellitus without complications: Secondary | ICD-10-CM | POA: Diagnosis not present

## 2017-05-23 DIAGNOSIS — C61 Malignant neoplasm of prostate: Secondary | ICD-10-CM

## 2017-05-23 DIAGNOSIS — E08319 Diabetes mellitus due to underlying condition with unspecified diabetic retinopathy without macular edema: Secondary | ICD-10-CM

## 2017-05-23 DIAGNOSIS — N183 Chronic kidney disease, stage 3 unspecified: Secondary | ICD-10-CM

## 2017-05-23 DIAGNOSIS — Z79899 Other long term (current) drug therapy: Secondary | ICD-10-CM

## 2017-05-23 DIAGNOSIS — I5032 Chronic diastolic (congestive) heart failure: Secondary | ICD-10-CM

## 2017-05-23 DIAGNOSIS — E1122 Type 2 diabetes mellitus with diabetic chronic kidney disease: Secondary | ICD-10-CM | POA: Diagnosis not present

## 2017-05-23 DIAGNOSIS — E782 Mixed hyperlipidemia: Secondary | ICD-10-CM

## 2017-05-23 DIAGNOSIS — Z794 Long term (current) use of insulin: Secondary | ICD-10-CM | POA: Diagnosis not present

## 2017-05-23 DIAGNOSIS — I10 Essential (primary) hypertension: Secondary | ICD-10-CM

## 2017-05-23 NOTE — Progress Notes (Signed)
Patient ID: Gregory Hatfield, male   DOB: 25-Oct-1925, 83 y.o.   MRN: [id number]  FOLLOW UP OV  Assessment:   Essential hypertension - continue medications, DASH diet, exercise and monitor at home. Call if greater than 130/80.   CKD stage 3 due to type 2 diabetes mellitus (Gregory Hatfield) Discussed general issues about diabetes pathophysiology and management., Educational material distributed., Suggested low cholesterol diet., Encouraged aerobic exercise., Discussed foot care., Reminded to get yearly retinal exam. - BASIC METABOLIC PANEL WITH GFR   Dyslipidemia associated with type 2 diabetes mellitus (Gregory Hatfield) -continue medications, check lipids, decrease fatty foods, increase activity.    Prostate cancer (Gregory Hatfield) Continue follow up  Proliferative diabetic retinopathy associated with type 2 diabetes mellitus, macular edema presence unspecified (Gregory Hatfield) continue follow up  Chronic diastolic CHF (congestive heart failure) (Gregory Hatfield) Monitor weight  Insulin-requiring or dependent type II diabetes mellitus (Gregory Hatfield) Discussed general issues about diabetes pathophysiology and management., Educational material distributed., Suggested low cholesterol diet., Encouraged aerobic exercise., Discussed foot care., Reminded to get yearly retinal exam. Goal is around 8 for A1C, monitor closely  Diabetic retinopathy without macular edema associated with diabetes mellitus due to underlying condition, unspecified laterality, unspecified retinopathy severity (Gregory Hatfield) Continue eye visits Discussed disease progression and risks Discussed diet/exercise, weight management and risk modification   Future Appointments  Date Time Provider [place]  07/18/2017  1:30 PM Landis Martins, DPM [facility]  09/05/2017  2:30 PM [name], MD [facility] None  09/07/2017 10:30 AM Renato Shin, MD [facility] None  03/08/2018  9:00 AM [name], MD [facility] None      Subjective:  Gregory Hatfield  presents  for follow up OV.    He has had elevated blood pressure since 1992. His blood pressure has been controlled at home, BP: 140/72 He does not workout. He denies chest pain, shortness of breath, dizziness.  His home health aide is here with him.  He is on cholesterol medication and denies myalgias. His cholesterol is at goal. The cholesterol last visit was:  Lab Results  Component Value Date   CHOL 143 02/16/2017   HDL 41 02/16/2017   LDLCALC 68 10/19/2016   TRIG 176 (H) 02/16/2017   CHOLHDL 3.5 02/16/2017   He has had diabetes for 43 years since 1973 - initially treated with oral agents but started on Insulin in 1997. Currently he's followed by Dr Loanne Drilling who has innovated a protocol of loose control given the patient's limited cognitive capacity and insight in comprehending a vigorous regimen, on insulin,  50 units in the AM and 25 at night, has home health aide that is helping .  He has been working on diet and exercise and denies foot ulcerations, hyperglycemia, hypoglycemia , nausea, paresthesia of the feet, polydipsia, polyuria and visual disturbances. Last A1C in the office was:  Lab Results  Component Value Date   HGBA1C 7.8 05/09/2017   Patient is on Vitamin D supplement.   Lab Results  Component Value Date   VD25OH 70 02/16/2017     Patient has GERD which is controlled with diet and IBS for which he occasionally takes hyoscyamine. Other problems include Depression, controlled or in remission and lastly hx/o of low grade Prostate Cancer monitored by Dr Gaynelle Arabian.  Patient has SDAT, still living at home, has home health aide, he is making breakfast but health aide is cooking other meals, granddaughter lives with him. Walking with a walking stick for balance. Lab Results  Component Value Date   GFRAA 6 (  L) 02/16/2017   BMI is Body mass index is 30.68 kg/m., he is working on diet and exercise. Wt Readings from Last 3 Encounters:  05/23/17 220 lb (99.8 kg)  05/09/17 220 lb 12.8 oz  (100.2 kg)  03/07/17 220 lb 3.2 oz (99.9 kg)    Medication Review: Current Outpatient Medications on File Prior to Visit  Medication Sig Dispense Refill  . aspirin 81 MG tablet Take 81 mg by mouth daily.    . B-D UF III MINI PEN NEEDLES 31G X 5 MM MISC USE FOR INJECTIONS AS DIRECTED 100 each 0  . carvedilol (COREG) 6.25 MG tablet TAKE 1 TABLET TWICE DAILY WITH A MEAL. 180 tablet 1  . Cholecalciferol (VITAMIN D3) 5000 UNITS CAPS Take 5,000 Units by mouth 2 (two) times daily.     Marland Kitchen dextromethorphan-guaiFENesin (MUCINEX DM) 30-600 MG 12hr tablet Take 1 tablet by mouth 2 (two) times daily.    . furosemide (LASIX) 40 MG tablet TAKE 1 OR 2 TABLETS ONCE DAILY FOR BLOOD PRESSURE AND ANKLE SWELLING. 180 tablet 1  . hydrALAZINE (APRESOLINE) 50 MG tablet TAKE 1 TABLET BY MOUTH 3 TIMES A DAY. 90 tablet 0  . hyoscyamine (LEVSIN/SL) 0.125 MG SL tablet Place 1 tablet (0.125 mg total) under the tongue every 4 (four) hours as needed. 30 tablet 0  . Insulin Detemir (LEVEMIR FLEXTOUCH) 100 UNIT/ML Pen 50 units in the am and 25 units in the pm 30 mL 2  . LUMIGAN 0.01 % SOLN Place 1 drop into the right eye at bedtime.     . Multiple Vitamin (MULTIVITAMIN) tablet Take 1 tablet by mouth daily.      Marland Kitchen MYRBETRIQ 50 MG TB24 tablet Take 50 mg by mouth daily.     . ONE TOUCH ULTRA TEST test strip CHECK BLOOD SUGAR 6 TIMES A DAY. 540 each 3  . pravastatin (PRAVACHOL) 40 MG tablet TAKE 1 TABLET ONCE DAILY FOR CHOLESTEROL. 30 tablet 3  . Probiotic Product (PROBIOTIC DAILY PO) Take 1 tablet by mouth daily.     Marland Kitchen senna-docusate (SENOKOT-S) 8.6-50 MG tablet Take 1 tablet by mouth at bedtime. (Patient taking differently: Take 1 tablet by mouth daily. )    . SIMBRINZA 1-0.2 % SUSP Place 1 drop into both eyes 3 (three) times daily.    Marland Kitchen amLODipine (NORVASC) 5 MG tablet Take 1 tablet (5 mg total) by mouth daily. 90 tablet 3   No current facility-administered medications on file prior to visit.     Current Problems  (verified) Patient Active Problem List   Diagnosis Date Noted  . Symptomatic bradycardia 10/06/2016  . Diabetes mellitus with complication ([place]) 11/94/1740  . AKI (acute kidney injury) ([place]) 10/06/2016  . Chronic diastolic CHF (congestive heart failure) ([place]) 10/06/2016  . Hyperlipidemia 01/12/2016  . Prostate cancer ([place]) 10/12/2015  . Diabetic retinopathy ([place]) 10/12/2015  . Encounter for Medicare annual wellness exam 10/12/2015  . Open-angle glaucoma 07/06/2015  . Vitamin D deficiency 06/03/2015  . Medication management 06/03/2015  . Stage 3 chronic kidney disease ([place]) 06/03/2015  . Insulin-requiring or dependent type II diabetes mellitus ([place]) 05/05/2015  . Essential hypertension 05/05/2015  . SDAT (senile dementia of Alzheimer's type) 06/16/2013   Allergies Allergies  Allergen Reactions  . Ace Inhibitors Other (See Comments) and Cough    Chronic cough  . Demerol Other (See Comments)    Hard to wake up  . Cosopt [Dorzolamide Hcl-Timolol Mal] Other (See Comments)    Cause heart rate to drop  .  Flomax [Tamsulosin Hcl] Other (See Comments)    Low BP  . Morphine And Related Other (See Comments)    hallucinations  . Oxytrol [Oxybutynin] Rash    SURGICAL HISTORY He  has a past surgical history that includes Cardiac catheterization (04/2004); Prostate surgery (10/2010); transthoracic echocardiogram (05/2006); Carpal tunnel release (Right); Lumbar spine surgery (2003); Cervical spine surgery (1990's); Total hip arthroplasty (Left, 01/17/2014); and Transurethral resection of prostate (N/A, 08/22/2014). FAMILY HISTORY His family history includes Cancer in his brother and child; Diabetes in his brother, child, mother, and sister; Heart attack in his child; Heart disease in his brother, father, and sister; Hyperlipidemia in his child; Hypertension in his child and mother; Prostate cancer in his father. SOCIAL HISTORY He  reports that he quit smoking about 33 years ago. he has never used  smokeless tobacco. He reports that he does not drink alcohol or use drugs.   Objective:     BP 140/72   Pulse 72   Temp (!) 97.3 F (36.3 C)   Resp 16   Ht 5\' 11"  (1.803 m)   Wt 220 lb (99.8 kg)   SpO2 94%   BMI 30.68 kg/m   General Appearance:  Alert  WD/WN, male , in no apparent distress. Eyes: PERRLA, EOMs, conjunctiva no swelling or erythema, normal fundi and vessels. Sinuses: No frontal/maxillary tenderness ENT/Mouth: EACs patent / TMs  nl. Nares clear without erythema, swelling, mucoid exudates. Oral hygiene is good. No erythema, swelling, or exudate. Tongue normal, non-obstructing. Tonsils not swollen or erythematous. Hearing decreased Neck: Supple, thyroid normal. No bruits, nodes or JVD. Respiratory: Respiratory effort normal.  BS equal and clear bilateral without rales, rhonci, wheezing or stridor. Cardio: Heart sounds are normal with regular rate and rhythm and no murmurs, rubs or gallops. Peripheral pulses are normal and equal bilaterally with 1-2 edema, stasis dermatitis changes.  Chest: symmetric with normal excursions and percussion.  Abdomen: Obese, soft, with nl bowel sounds. Nontender, no guarding, rebound, hernias, masses, or organomegaly.  Lymphatics: Non tender without lymphadenopathy.  Musculoskeletal: Full ROM all peripheral extremities, joint stability, 4/5 strength, and antaglic, slow gait with cane.  Skin: Warm and dry without rashes, lesions, cyanosis, clubbing or  ecchymosis.  Neuro: Cranial nerves intact, reflexes equal bilaterally. Normal muscle tone, no cerebellar symptoms.   Pysch: Awake and oriented X 2 with normal affect, insight and judgment appropriate for age   Vicie Mutters, Hershal Coria   05/23/2017

## 2017-05-23 NOTE — Patient Instructions (Addendum)
VENOUS INSUFFICIENCY Our lower leg venous system is not the most reliable, the heart does NOT pump fluid up, there is a valve system.  The muscles of the leg squeeze and the blood moves up and a valve opens and close, then they squeeze, blood moves up and valves open and closes keeping the blood moving towards the heart.  Lots can go wrong with this valve system.  If someone is sitting or standing without movement, everyone will get swelling.  THINGS TO DO:  Do not stand or sit in one position for long periods of time. Do not sit with your legs crossed. Rest with your legs raised during the day.  Your legs have to be higher than your heart so that gravity will force the valves to open, so please really elevate your legs.   Wear elastic stockings or support hose. Do not wear other tight, encircling garments around the legs, pelvis, or waist.  [employer]  has a wide variety of well priced compression stockings. [place], [place] [id number] [id number] [place] has a good cheap selection, I like the socks, they are not as hard to get on  Walk as much as possible to increase blood flow.  Raise the foot of your bed at night with 2-inch blocks.  SEEK MEDICAL CARE IF:   The skin around your ankle starts to break down.  You have pain, redness, tenderness, or hard swelling developing in your leg over a vein.  You are uncomfortable due to leg pain.  If you ever have shortness of breath with exertion or chest pain go to the ER.    You can take tylenol (500mg ) or tylenol arthritis (650mg ).  The max you can take of tylenol a day is 3000mg  daily, max of 4 a day of the tylenol arthritis (650mg ) as long as no other medications you are taking contain tylenol.   Heating pad, try that on your neck  Go to the ER if you have any new weakness in your arms, trouble with your grip, worse headache ever, fever, chills. or have worsening pain.   If you are not better in 1-3 month  we will refer you to ortho  Managing pain, stiffness, and swelling  If directed, use a cervical traction device as told by your health care provider.  If directed, apply heat to the affected area before you do your physical therapy or as often as told by your health care provider. Use the heat source that your health care provider recommends, such as a moist heat pack or a heating pad. ? Place a towel between your skin and the heat source. ? Leave the heat on for 20-30 minutes. ? Remove the heat if your skin turns bright red. This is especially important if you are unable to feel pain, heat, or cold. You may have a greater risk of getting burned.  If directed, put ice on the affected area: ? Put ice in a plastic bag. ? Place a towel between your skin and the bag. ? Leave the ice on for 20 minutes, 2-3 times a day. Activity  Do not drive while wearing a cervical collar. If you do not have a cervical collar, ask your health care provider if it is safe to drive while your neck heals.  Do not drive or use heavy machinery while taking prescription pain medicine or muscle relaxants, unless your health care provider approves.  Do not lift anything that is  heavier than 10 lb (4.5 kg) until your health care provider tells you that it is safe.  Rest as directed by your health care provider. Avoid positions and activities that make your symptoms worse. Ask your health care provider what activities are safe for you.  If physical therapy was prescribed, do exercises as told by your health care provider or physical therapist. General instructions  Take over-the-counter and prescription medicines only as told by your health care provider.  Do not use any products that contain nicotine or tobacco, such as cigarettes and e-cigarettes. These can delay healing. If you need help quitting, ask your health care provider.  Keep all follow-up visits as told by your health care provider or physical therapist.  This is important. How is this prevented? To prevent a cervical sprain from happening again:  Use and maintain good posture. Make any needed adjustments to your workstation to help you use good posture.  Exercise regularly as directed by your health care provider or physical therapist.  Avoid risky activities that may cause a cervical sprain.  Contact a health care provider if:  You have symptoms that get worse or do not get better after 2 weeks of treatment.  You have pain that gets worse or does not get better with medicine.  You develop new, unexplained symptoms.  You have sores or irritated skin on your neck from wearing your cervical collar. Get help right away if:  You have severe pain.  You develop numbness, tingling, or weakness in any part of your body.  You cannot move a part of your body (you have paralysis).  You have neck pain along with: ? Severe dizziness. ? Headache. Summary  A cervical sprain is a stretch or tear in one or more of the tough, cord-like tissues that connect bones (ligaments) in the neck.  Cervical sprains may be caused by an injury (trauma), such as from a motor vehicle accident, a fall, or sudden forward and backward whipping movement of the head and neck (whiplash injury).  Symptoms may develop right away after injury, or they may develop over a few days.  This condition is treated by resting and icing the injured area and doing physical therapy exercises. This information is not intended to replace advice given to you by your health care provider. Make sure you discuss any questions you have with your health care provider. Document Released: 04/03/2007 Document Revised: 02/03/2016 Document Reviewed: 02/03/2016 Elsevier Interactive Patient Education  2017 [employer]   Cervical Strain and Sprain Rehab Ask your health care provider which exercises are safe for you. Do exercises exactly as told by your health care provider and adjust  them as directed. It is normal to feel mild stretching, pulling, tightness, or discomfort as you do these exercises, but you should stop right away if you feel sudden pain or your pain gets worse.Do not begin these exercises until told by your health care provider. Stretching and range of motion exercises These exercises warm up your muscles and joints and improve the movement and flexibility of your neck. These exercises also help to relieve pain, numbness, and tingling. Exercise A: Cervical side bend  1. Using good posture, sit on a stable chair or stand up. 2. Without moving your shoulders, slowly tilt your left / right ear to your shoulder until you feel a stretch in your neck muscles. You should be looking straight ahead. 3. Hold for __________ seconds. 4. Repeat with the other side of your neck. Repeat  __________ times. Complete this exercise __________ times a day. Exercise B: Cervical rotation  1. Using good posture, sit on a stable chair or stand up. 2. Slowly turn your head to the side as if you are looking over your left / right shoulder. ? Keep your eyes level with the ground. ? Stop when you feel a stretch along the side and the back of your neck. 3. Hold for __________ seconds. 4. Repeat this by turning to your other side. Repeat __________ times. Complete this exercise __________ times a day. Exercise C: Thoracic extension and pectoral stretch 1. Roll a towel or a small blanket so it is about 4 inches (10 cm) in diameter. 2. Lie down on your back on a firm surface. 3. Put the towel lengthwise, under your spine in the middle of your back. It should not be not under your shoulder blades. The towel should line up with your spine from your middle back to your lower back. 4. Put your hands behind your head and let your elbows fall out to your sides. 5. Hold for __________ seconds. Repeat __________ times. Complete this exercise __________ times a day. Strengthening  exercises These exercises build strength and endurance in your neck. Endurance is the ability to use your muscles for a long time, even after your muscles get tired. Exercise D: Upper cervical flexion, isometric 1. Lie on your back with a thin pillow behind your head and a small rolled-up towel under your neck. 2. Gently tuck your chin toward your chest and nod your head down to look toward your feet. Do not lift your head off the pillow. 3. Hold for __________ seconds. 4. Release the tension slowly. Relax your neck muscles completely before you repeat this exercise. Repeat __________ times. Complete this exercise __________ times a day. Exercise E: Cervical extension, isometric  1. Stand about 6 inches (15 cm) away from a wall, with your back facing the wall. 2. Place a soft object, about 6-8 inches (15-20 cm) in diameter, between the back of your head and the wall. A soft object could be a small pillow, a ball, or a folded towel. 3. Gently tilt your head back and press into the soft object. Keep your jaw and forehead relaxed. 4. Hold for __________ seconds. 5. Release the tension slowly. Relax your neck muscles completely before you repeat this exercise. Repeat __________ times. Complete this exercise __________ times a day. Posture and body mechanics  Body mechanics refers to the movements and positions of your body while you do your daily activities. Posture is part of body mechanics. Good posture and healthy body mechanics can help to relieve stress in your body's tissues and joints. Good posture means that your spine is in its natural S-curve position (your spine is neutral), your shoulders are pulled back slightly, and your head is not tipped forward. The following are general guidelines for applying improved posture and body mechanics to your everyday activities. Standing  When standing, keep your spine neutral and keep your feet about hip-width apart. Keep a slight bend in your knees.  Your ears, shoulders, and hips should line up.  When you do a task in which you stand in one place for a long time, place one foot up on a stable object that is 2-4 inches (5-10 cm) high, such as a footstool. This helps keep your spine neutral. Sitting   When sitting, keep your spine neutral and your keep feet flat on the floor. Use a footrest, if  necessary, and keep your thighs parallel to the floor. Avoid rounding your shoulders, and avoid tilting your head forward.  When working at a desk or a computer, keep your desk at a height where your hands are slightly lower than your elbows. Slide your chair under your desk so you are close enough to maintain good posture.  When working at a computer, place your monitor at a height where you are looking straight ahead and you do not have to tilt your head forward or downward to look at the screen. Resting When lying down and resting, avoid positions that are most painful for you. Try to support your neck in a neutral position. You can use a contour pillow or a small rolled-up towel. Your pillow should support your neck but not push on it. This information is not intended to replace advice given to you by your health care provider. Make sure you discuss any questions you have with your health care provider. Document Released: 06/06/2005 Document Revised: 02/11/2016 Document Reviewed: 05/13/2015 Elsevier Interactive Patient Education  [employer].

## 2017-05-24 LAB — CBC WITH DIFFERENTIAL/PLATELET
BASOS ABS: 57 {cells}/uL (ref 0–200)
BASOS PCT: 1 %
EOS ABS: 171 {cells}/uL (ref 15–500)
Eosinophils Relative: 3 %
HEMATOCRIT: 37.3 % — AB (ref 38.5–50.0)
HEMOGLOBIN: 12.3 g/dL — AB (ref 13.2–17.1)
Lymphs Abs: 2320 cells/uL (ref 850–3900)
MCH: 28.5 pg (ref 27.0–33.0)
MCHC: 33 g/dL (ref 32.0–36.0)
MCV: 86.3 fL (ref 80.0–100.0)
MPV: 12.7 fL — AB (ref 7.5–12.5)
Monocytes Relative: 8.4 %
NEUTROS ABS: 2673 {cells}/uL (ref 1500–7800)
Neutrophils Relative %: 46.9 %
Platelets: 171 10*3/uL (ref 140–400)
RBC: 4.32 10*6/uL (ref 4.20–5.80)
RDW: 12.2 % (ref 11.0–15.0)
Total Lymphocyte: 40.7 %
WBC: 5.7 10*3/uL (ref 3.8–10.8)
WBCMIX: 479 {cells}/uL (ref 200–950)

## 2017-05-24 LAB — BASIC METABOLIC PANEL WITH GFR
BUN/Creatinine Ratio: 14 (calc) (ref 6–22)
BUN: 28 mg/dL — ABNORMAL HIGH (ref 7–25)
CALCIUM: 9.8 mg/dL (ref 8.6–10.3)
CHLORIDE: 103 mmol/L (ref 98–110)
CO2: 29 mmol/L (ref 20–32)
Creat: 2.03 mg/dL — ABNORMAL HIGH (ref 0.70–1.11)
GFR, EST AFRICAN AMERICAN: 33 mL/min/{1.73_m2} — AB (ref >=60)
GFR, Est Non African American: 28 mL/min/{1.73_m2} — ABNORMAL LOW (ref >=60)
GLUCOSE: 116 mg/dL — AB (ref 65–99)
POTASSIUM: 4.1 mmol/L (ref 3.5–5.3)
Sodium: 141 mmol/L (ref 135–146)

## 2017-05-24 LAB — LIPID PANEL
CHOL/HDL RATIO: 3 (calc) (ref <5.0)
Cholesterol: 139 mg/dL (ref <200)
HDL: 47 mg/dL (ref >40)
LDL Cholesterol (Calc): 73 mg/dL (calc)
NON-HDL CHOLESTEROL (CALC): 92 mg/dL (ref <130)
Triglycerides: 102 mg/dL (ref <150)

## 2017-05-24 LAB — HEMOGLOBIN A1C
EAG (MMOL/L): 10.1 (calc)
HEMOGLOBIN A1C: 8 %{Hb} — AB (ref <5.7)
Mean Plasma Glucose: 183 (calc)

## 2017-05-24 LAB — HEPATIC FUNCTION PANEL
AG RATIO: 1.5 (calc) (ref 1.0–2.5)
ALT: 19 U/L (ref 9–46)
AST: 24 U/L (ref 10–35)
Albumin: 4.3 g/dL (ref 3.6–5.1)
Alkaline phosphatase (APISO): 56 U/L (ref 40–115)
BILIRUBIN DIRECT: 0.1 mg/dL (ref 0.0–0.2)
BILIRUBIN INDIRECT: 0.3 mg/dL (ref 0.2–1.2)
BILIRUBIN TOTAL: 0.4 mg/dL (ref 0.2–1.2)
Globulin: 2.8 g/dL (calc) (ref 1.9–3.7)
Total Protein: 7.1 g/dL (ref 6.1–8.1)

## 2017-05-24 LAB — TSH: TSH: 1.8 mIU/L (ref 0.40–4.50)

## 2017-06-22 ENCOUNTER — Other Ambulatory Visit: Payer: Self-pay | Admitting: Internal Medicine

## 2017-06-22 ENCOUNTER — Other Ambulatory Visit: Payer: Self-pay | Admitting: Endocrinology

## 2017-06-26 ENCOUNTER — Other Ambulatory Visit: Payer: Self-pay | Admitting: Internal Medicine

## 2017-07-07 ENCOUNTER — Other Ambulatory Visit: Payer: Self-pay | Admitting: Internal Medicine

## 2017-07-18 ENCOUNTER — Encounter: Payer: Self-pay | Admitting: Sports Medicine

## 2017-07-18 ENCOUNTER — Ambulatory Visit: Payer: Medicare Other | Admitting: Sports Medicine

## 2017-07-18 DIAGNOSIS — B351 Tinea unguium: Secondary | ICD-10-CM | POA: Diagnosis not present

## 2017-07-18 DIAGNOSIS — E119 Type 2 diabetes mellitus without complications: Secondary | ICD-10-CM

## 2017-07-18 DIAGNOSIS — M79676 Pain in unspecified toe(s): Secondary | ICD-10-CM

## 2017-07-18 NOTE — Progress Notes (Signed)
Patient ID: Gregory Hatfield, male   DOB: 05/27/1926, 84 y.o.   MRN: [id number]  Subjective: Gregory Hatfield is a 84 y.o. male patient with history of type 2 diabetes who presents to office today complaining of long, painful nails while ambulating in shoes; unable to trim. Patient denies any other new problems.   FBS today was 131  Patient Active Problem List   Diagnosis Date Noted  . Symptomatic bradycardia 10/06/2016  . Diabetes mellitus with complication ([place]) 66/44/0347  . AKI (acute kidney injury) ([place]) 10/06/2016  . Chronic diastolic CHF (congestive heart failure) ([place]) 10/06/2016  . Hyperlipidemia 01/12/2016  . Prostate cancer ([place]) 10/12/2015  . Diabetic retinopathy ([place]) 10/12/2015  . Encounter for Medicare annual wellness exam 10/12/2015  . Open-angle glaucoma 07/06/2015  . Vitamin D deficiency 06/03/2015  . Medication management 06/03/2015  . Stage 3 chronic kidney disease ([place]) 06/03/2015  . Insulin-requiring or dependent type II diabetes mellitus ([place]) 05/05/2015  . Essential hypertension 05/05/2015  . SDAT (senile dementia of Alzheimer's type) 06/16/2013   Current Outpatient Medications on File Prior to Visit  Medication Sig Dispense Refill  . amLODipine (NORVASC) 5 MG tablet Take 1 tablet (5 mg total) by mouth daily. 90 tablet 3  . aspirin 81 MG tablet Take 81 mg by mouth daily.    . B-D UF III MINI PEN NEEDLES 31G X 5 MM MISC USE FOR INJECTIONS AS DIRECTED 100 each 0  . carvedilol (COREG) 6.25 MG tablet TAKE 1 TABLET BY MOUTH TWICE DAILY WITH A MEAL. 180 tablet 1  . Cholecalciferol (VITAMIN D3) 5000 UNITS CAPS Take 5,000 Units by mouth 2 (two) times daily.     Marland Kitchen dextromethorphan-guaiFENesin (MUCINEX DM) 30-600 MG 12hr tablet Take 1 tablet by mouth 2 (two) times daily.    . furosemide (LASIX) 40 MG tablet TAKE 1 OR 2 TABLETS ONCE DAILY FOR BLOOD PRESSURE AND ANKLE SWELLING. 180 tablet 1  . hydrALAZINE (APRESOLINE) 50 MG tablet TAKE 1 TABLET BY MOUTH 3 TIMES A DAY. 90  tablet 2  . hyoscyamine (LEVSIN/SL) 0.125 MG SL tablet Place 1 tablet (0.125 mg total) under the tongue every 4 (four) hours as needed. 30 tablet 0  . Insulin Detemir (LEVEMIR FLEXTOUCH) 100 UNIT/ML Pen 50 units in the am and 25 units in the pm 30 mL 2  . LUMIGAN 0.01 % SOLN Place 1 drop into the right eye at bedtime.     . Multiple Vitamin (MULTIVITAMIN) tablet Take 1 tablet by mouth daily.      Marland Kitchen MYRBETRIQ 50 MG TB24 tablet Take 50 mg by mouth daily.     . ONE TOUCH ULTRA TEST test strip CHECK BLOOD SUGAR 6 TIMES A DAY. 540 each 3  . pravastatin (PRAVACHOL) 40 MG tablet TAKE 1 TABLET ONCE DAILY FOR CHOLESTEROL. 30 tablet 2  . Probiotic Product (PROBIOTIC DAILY PO) Take 1 tablet by mouth daily.     Marland Kitchen senna-docusate (SENOKOT-S) 8.6-50 MG tablet Take 1 tablet by mouth at bedtime. (Patient taking differently: Take 1 tablet by mouth daily. )    . SIMBRINZA 1-0.2 % SUSP Place 1 drop into both eyes 3 (three) times daily.     No current facility-administered medications on file prior to visit.    Allergies  Allergen Reactions  . Ace Inhibitors Other (See Comments) and Cough    Chronic cough  . Demerol Other (See Comments)    Hard to wake up  . Cosopt [Dorzolamide Hcl-Timolol Mal] Other (See Comments)  Cause heart rate to drop  . Flomax [Tamsulosin Hcl] Other (See Comments)    Low BP  . Morphine And Related Other (See Comments)    hallucinations  . Oxytrol [Oxybutynin] Rash    Objective: General: Patient is awake, alert, and oriented x 3 and in no acute distress. Cane assisted gait.   Integument: Skin is warm, dry and supple bilateral. Nails are tender, long, thickened and dystrophic with subungual debris, consistent with onychomycosis, 1-5 bilateral. No signs of infection. No open lesions or preulcerative lesions present bilateral. Remaining integument unremarkable.  Vasculature:  Dorsalis Pedis pulse 1/4 bilateral. Posterior Tibial pulse  0/4 bilateral. No ischemia or gangrene.  Capillary fill time <3 sec 1-5 bilateral. Positive hair growth to the level of the digits.Temperature gradient within normal limits. No varicosities present bilateral. Trace edema present bilateral.   Neurology: The patient has intact sensation measured with a 5.07/10g Semmes Weinstein Monofilament at all pedal sites bilateral . Vibratory sensation diminished bilateral with tuning fork. No Babinski sign present bilateral.   Musculoskeletal: No symptomatic pedal deformities noted bilateral. Muscular strength 5/5 in all lower extremity muscular groups bilateral without pain or limitation on range of motion. No tenderness with calf compression bilateral.  Assessment and Plan: Problem List Items Addressed This Visit    None    Visit Diagnoses    Pain due to onychomycosis of toenail    -  Primary   Diabetes mellitus without complication ([place])         -Examined patient. -Discussed and educated patient on diabetic foot care, especially with  regards to the vascular, neurological and musculoskeletal systems.  -Stressed the importance of good glycemic control and the detriment of not  controlling glucose levels in relation to the foot. -Mechanically debrided all nails 1-5 bilateral using sterile nail nipper and filed with dremel without incident  -Continue with cane for stability in gait  -Patient to return in 3 months for at risk foot care -Patient advised to call the office if any problems or questions arise in the meantime.  Landis Martins, DPM

## 2017-07-19 ENCOUNTER — Other Ambulatory Visit: Payer: Self-pay | Admitting: Internal Medicine

## 2017-08-08 ENCOUNTER — Ambulatory Visit: Payer: Medicare Other | Admitting: Pharmacist Clinician (PhC)/ Clinical Pharmacy Specialist

## 2017-08-08 ENCOUNTER — Encounter: Payer: Self-pay | Admitting: Pharmacist Clinician (PhC)/ Clinical Pharmacy Specialist

## 2017-08-08 DIAGNOSIS — I10 Essential (primary) hypertension: Secondary | ICD-10-CM

## 2017-08-08 NOTE — Patient Instructions (Signed)
Return for a a follow up appointment in 1 month  Your blood pressure today is 128/62  Check your blood pressure at home daily and keep record of the readings.  Take your BP meds as follows:   Continue with all current medications  Bring all of your meds, your BP cuff and your record of home blood pressures to your next appointment.  Exercise as you're able, try to walk approximately 30 minutes per day.  Keep salt intake to a minimum, especially watch canned and prepared boxed foods.  Eat more fresh fruits and vegetables and fewer canned items.  Avoid eating in fast food restaurants.    HOW TO TAKE YOUR BLOOD PRESSURE: . Rest 5 minutes before taking your blood pressure. .  Don't smoke or drink caffeinated beverages for at least 30 minutes before. . Take your blood pressure before (not after) you eat. . Sit comfortably with your back supported and both feet on the floor (don't cross your legs). . Elevate your arm to heart level on a table or a desk. . Use the proper sized cuff. It should fit smoothly and snugly around your bare upper arm. There should be enough room to slip a fingertip under the cuff. The bottom edge of the cuff should be 1 inch above the crease of the elbow. . Ideally, take 3 measurements at one sitting and record the average.

## 2017-08-08 NOTE — Assessment & Plan Note (Signed)
Patient with isolated systolic hypertension based on home readings, but had a good reading in the office today at 128/62.  Will not change any of his medications at this time, but will have him continue with home monitoring and bring his newer cuff in for a follow up visit in a month.

## 2017-08-08 NOTE — Progress Notes (Signed)
Patient ID: Gregory Hatfield                 DOB: 1925-12-03                      MRN: [id number]     HPI: Gregory Hatfield is a 84 y.o. male referred by Dr. Debara Pickett to HTN clinic. PMH includes symptomatic bradycardia, CHF, DM, hypertension, CKD, and dyslipidemia.  He was also hospitalized last year with bradycardia and possible confusion with medications.  He was seen multiple times in the CV[facility] clinic after that and eventually understood how and when to take each of his medications.  We have not seen him for several months, but they scheduled an appointment for today as his home blood pressures seem to be going up.  He is here today with his med aide and states continued compliance with his medications.   No complaints about his health today, no dizziness, chest pain or shortness of breath.  He does endorse occasional lower extremity edema, but not enough to be bothersome at this time.  He gets many of his meals from [employer] Whe[employer], so he cannot control his diet too much.  Because of this he has a difficult time with his blood sugars, which seem to spike and drop regularly.  He recently bought a new blood pressure cuff and has not had it checked against the physician cuff yet.    Current HTN meds:  Amlodipine 5mg  daily Carvedilol 6.25mg  twice daily Hydralazine 50mg  TID Furosemide 40mg  twice daily  BP goal: < 140/90   Family History: Diabetes and hypertension from mother;cancer and heart disease from father; diabetes and heart disease siblings, and hyperlipidemia, hypertension, DM and cancer in [place].  Social History: reports that he quit smoking about 33 years ago. He has never used smokeless tobacco. He reports that he does not drink alcohol or use drugs  Coffee tid with meals, no other caffeine  Diet:  Gets meals on wheels; does admit to adding salt tot many foods; now eating wheat breads, pastas  Exercise:  Has exercise bike at home, rides for 15-20 minutes 3-4 times daily.    Home  BP readings: checks 4 times per day, recently bought new cuff, has not been able to calibrate with office readings.   Over the past 2-3 weeks, most home readings were 244-010 systolic range.  Diastolic readings all WNL and HR usually in the 50's.      Intolerances: ACEI caused cough  Wt Readings from Last 3 Encounters:  05/23/17 220 lb (99.8 kg)  05/09/17 220 lb 12.8 oz (100.2 kg)  03/07/17 220 lb 3.2 oz (99.9 kg)   BP Readings from Last 3 Encounters:  08/08/17 128/62  05/23/17 140/72  05/09/17 128/64   Pulse Readings from Last 3 Encounters:  08/08/17 (!) 56  05/23/17 72  05/09/17 62    Past Medical History:  Diagnosis Date  . Bradycardia 10/06/2016  . CHF (congestive heart failure) ([place])   . CKD stage 4 due to type 2 diabetes mellitus ([place]) 06/03/2015  . Dyslipidemia associated with type 2 diabetes mellitus ([place]) 05/05/2015  . Essential hypertension 05/05/2015  . IDDM (insulin dependent diabetes mellitus) ([place])   . SDAT (senile dementia of Alzheimer's type) 06/16/2013  . Uncontrolled insulin-dependent diabetes mellitus with renal manifestation ([place]) 05/05/2015    Current Outpatient Medications on File Prior to Visit  Medication Sig Dispense Refill  . amLODipine (NORVASC) 5 MG tablet Take 1  tablet (5 mg total) by mouth daily. 90 tablet 3  . aspirin 81 MG tablet Take 81 mg by mouth daily.    . B-D UF III MINI PEN NEEDLES 31G X 5 MM MISC USE FOR INJECTIONS AS DIRECTED 100 each 0  . carvedilol (COREG) 6.25 MG tablet TAKE 1 TABLET BY MOUTH TWICE DAILY WITH A MEAL. 180 tablet 1  . Cholecalciferol (VITAMIN D3) 5000 UNITS CAPS Take 5,000 Units by mouth 2 (two) times daily.     Marland Kitchen dextromethorphan-guaiFENesin (MUCINEX DM) 30-600 MG 12hr tablet Take 1 tablet by mouth 2 (two) times daily.    . furosemide (LASIX) 40 MG tablet TAKE 1 OR 2 TABLETS ONCE DAILY FOR BLOOD PRESSURE AND ANKLE SWELLING. 180 tablet 0  . hydrALAZINE (APRESOLINE) 50 MG tablet TAKE 1 TABLET BY MOUTH 3 TIMES A DAY. 90  tablet 2  . hyoscyamine (LEVSIN/SL) 0.125 MG SL tablet Place 1 tablet (0.125 mg total) under the tongue every 4 (four) hours as needed. 30 tablet 0  . Insulin Detemir (LEVEMIR FLEXTOUCH) 100 UNIT/ML Pen 50 units in the am and 25 units in the pm 30 mL 2  . LUMIGAN 0.01 % SOLN Place 1 drop into the right eye at bedtime.     . Multiple Vitamin (MULTIVITAMIN) tablet Take 1 tablet by mouth daily.      Marland Kitchen MYRBETRIQ 50 MG TB24 tablet Take 50 mg by mouth daily.     . ONE TOUCH ULTRA TEST test strip CHECK BLOOD SUGAR 6 TIMES A DAY. 540 each 3  . pravastatin (PRAVACHOL) 40 MG tablet TAKE 1 TABLET ONCE DAILY FOR CHOLESTEROL. 30 tablet 2  . Probiotic Product (PROBIOTIC DAILY PO) Take 1 tablet by mouth daily.     Marland Kitchen senna-docusate (SENOKOT-S) 8.6-50 MG tablet Take 1 tablet by mouth at bedtime. (Patient taking differently: Take 1 tablet by mouth daily. )    . SIMBRINZA 1-0.2 % SUSP Place 1 drop into both eyes 3 (three) times daily.     No current facility-administered medications on file prior to visit.     Allergies  Allergen Reactions  . Ace Inhibitors Other (See Comments) and Cough    Chronic cough  . Demerol Other (See Comments)    Hard to wake up  . Cosopt [Dorzolamide Hcl-Timolol Mal] Other (See Comments)    Cause heart rate to drop  . Flomax [Tamsulosin Hcl] Other (See Comments)    Low BP  . Morphine And Related Other (See Comments)    hallucinations  . Oxytrol [Oxybutynin] Rash    Blood pressure 128/62, pulse (!) 56.  Essential hypertension:  Blood pressure well at goal in the office today despite continued elevated readings at home.  Readings from this week at home show a downward trend, has past 5-6 readings < 203 systolic.  With his poor balance and excellent office reading today, I will not make any medication changes.  He will continue with daily home BP checks and we will see him back in 3 months for a follow up visit.     Tommy Medal PharmD CPP [facility] [address] [place] [address] 08/08/2017 4:37 PM

## 2017-09-05 ENCOUNTER — Ambulatory Visit: Payer: Self-pay | Admitting: Internal Medicine

## 2017-09-06 ENCOUNTER — Encounter: Payer: Self-pay | Admitting: Internal Medicine

## 2017-09-06 ENCOUNTER — Ambulatory Visit: Payer: Medicare Other | Admitting: Internal Medicine

## 2017-09-06 VITALS — BP 164/76 | HR 60 | Temp 97.5°F | Resp 18 | Ht 69.75 in | Wt 218.8 lb

## 2017-09-06 DIAGNOSIS — Z794 Long term (current) use of insulin: Secondary | ICD-10-CM

## 2017-09-06 DIAGNOSIS — E119 Type 2 diabetes mellitus without complications: Secondary | ICD-10-CM

## 2017-09-06 DIAGNOSIS — F0280 Dementia in other diseases classified elsewhere without behavioral disturbance: Secondary | ICD-10-CM | POA: Diagnosis not present

## 2017-09-06 DIAGNOSIS — N183 Chronic kidney disease, stage 3 unspecified: Secondary | ICD-10-CM

## 2017-09-06 DIAGNOSIS — M109 Gout, unspecified: Secondary | ICD-10-CM | POA: Diagnosis not present

## 2017-09-06 DIAGNOSIS — E782 Mixed hyperlipidemia: Secondary | ICD-10-CM | POA: Diagnosis not present

## 2017-09-06 DIAGNOSIS — I10 Essential (primary) hypertension: Secondary | ICD-10-CM | POA: Diagnosis not present

## 2017-09-06 DIAGNOSIS — E1122 Type 2 diabetes mellitus with diabetic chronic kidney disease: Secondary | ICD-10-CM | POA: Diagnosis not present

## 2017-09-06 DIAGNOSIS — Z79899 Other long term (current) drug therapy: Secondary | ICD-10-CM

## 2017-09-06 DIAGNOSIS — G301 Alzheimer's disease with late onset: Secondary | ICD-10-CM

## 2017-09-06 DIAGNOSIS — E559 Vitamin D deficiency, unspecified: Secondary | ICD-10-CM | POA: Diagnosis not present

## 2017-09-06 NOTE — Progress Notes (Signed)
This very nice 84 y.o. [facility]  presents for 6 month follow up with HTN, HLD, T2_DM, Dementia  and Vitamin D Deficiency.      Patient is treated for HTN (1992)  & BP has been labile. Today's BP was elevated at 164/76. Apparently , he has NOT taken ANY of his meds this morning (current time is 10:30 AM)  Patient was hospitalized in Apr 2018 with Bradycardia - recovering with tapering of his Beta blocker medications. Patient has had no complaints of any cardiac type chest pain, palpitations, dyspnea / orthopnea / PND, dizziness, claudication, or dependent edema.     Hyperlipidemia is controlled with diet. Last Lipids were at goal  Lab Results  Component Value Date   CHOL 139 05/23/2017   HDL 47 05/23/2017   LDLCALC 73 05/23/2017   TRIG 102 05/23/2017   CHOLHDL 3.0 05/23/2017      Also, the patient has history of Insulin Requiring T2_DM  Dx'd initially in 1975 and transitioned to Insulin in 1997. Patient has CKD3 (GFR 33) followed by Dr Mercy Moore. Diabetic control has been sub-optimal compromised by his Dementia & inability to comprehend dosing & SS regimens.  Dr Renato Shin has simplified his Diabetic management . Patient is not aware of  symptoms of reactive hypoglycemia, diabetic polys, paresthesias or visual blurring.  Last A1c was not at goal: Lab Results  Component Value Date   HGBA1C 8.0 (H) 05/23/2017      Further, the patient also has history of Vitamin D Deficiency and supplements vitamin D without any suspected side-effects. Last vitamin D was at goal:  Lab Results  Component Value Date   VD25OH 82 02/16/2017   Current Outpatient Medications on File Prior to Visit  Medication Sig  . aspirin 81 MG tablet Take 81 mg by mouth daily.  . B-D UF III MINI PEN NEEDLES 31G X 5 MM MISC USE FOR INJECTIONS AS DIRECTED  . carvedilol (COREG) 6.25 MG tablet TAKE 1 TABLET BY MOUTH TWICE DAILY WITH A MEAL.  Marland Kitchen Cholecalciferol (VITAMIN D3) 5000 UNITS CAPS Take 5,000 Units by mouth 2 (two) times  daily.   Marland Kitchen dextromethorphan-guaiFENesin (MUCINEX DM) 30-600 MG 12hr tablet Take 1 tablet by mouth 2 (two) times daily.  . furosemide (LASIX) 40 MG tablet TAKE 1 OR 2 TABLETS ONCE DAILY FOR BLOOD PRESSURE AND ANKLE SWELLING.  . hydrALAZINE (APRESOLINE) 50 MG tablet TAKE 1 TABLET BY MOUTH 3 TIMES A DAY.  . hyoscyamine (LEVSIN/SL) 0.125 MG SL tablet Place 1 tablet (0.125 mg total) under the tongue every 4 (four) hours as needed.  . Insulin Detemir (LEVEMIR FLEXTOUCH) 100 UNIT/ML Pen 50 units in the am and 25 units in the pm  . LUMIGAN 0.01 % SOLN Place 1 drop into the right eye at bedtime.   . Multiple Vitamin (MULTIVITAMIN) tablet Take 1 tablet by mouth daily.    Marland Kitchen MYRBETRIQ 50 MG TB24 tablet Take 50 mg by mouth daily.   . ONE TOUCH ULTRA TEST test strip CHECK BLOOD SUGAR 6 TIMES A DAY.  . pravastatin (PRAVACHOL) 40 MG tablet TAKE 1 TABLET ONCE DAILY FOR CHOLESTEROL.  . Probiotic Product (PROBIOTIC DAILY PO) Take 1 tablet by mouth daily.   Marland Kitchen senna-docusate (SENOKOT-S) 8.6-50 MG tablet Take 1 tablet by mouth at bedtime. (Patient taking differently: Take 1 tablet by mouth daily. )  . SIMBRINZA 1-0.2 % SUSP Place 1 drop into both eyes 3 (three) times daily.  Marland Kitchen amLODipine (NORVASC) 5 MG tablet  Take 1 tablet (5 mg total) by mouth daily.   No current facility-administered medications on file prior to visit.    Allergies  Allergen Reactions  . Ace Inhibitors Other (See Comments) and Cough    Chronic cough  . Demerol Other (See Comments)    Hard to wake up  . Cosopt [Dorzolamide Hcl-Timolol Mal] Other (See Comments)    Cause heart rate to drop  . Flomax [Tamsulosin Hcl] Other (See Comments)    Low BP  . Morphine And Related Other (See Comments)    hallucinations  . Oxytrol [Oxybutynin] Rash   PMHx:   Past Medical History:  Diagnosis Date  . Bradycardia 10/06/2016  . CHF (congestive heart failure) ([place])   . CKD stage 4 due to type 2 diabetes mellitus ([place]) 06/03/2015  . Dyslipidemia  associated with type 2 diabetes mellitus ([place]) 05/05/2015  . Essential hypertension 05/05/2015  . IDDM (insulin dependent diabetes mellitus) ([place])   . SDAT (senile dementia of Alzheimer's type) 06/16/2013  . Uncontrolled insulin-dependent diabetes mellitus with renal manifestation ([place]) 05/05/2015   Immunization History  Administered Date(s) Administered  . DTaP 09/28/2006  . Influenza Whole 04/05/2010, 04/04/2013  . Influenza, High Dose Seasonal PF 04/02/2014, 03/30/2015  . Influenza-Unspecified 02/19/2011  . Pneumococcal Conjugate-13 06/10/2014  . Pneumococcal Polysaccharide-23 07/21/2001, 03/20/2008  . Td 08/28/2008   Past Surgical History:  Procedure Laterality Date  . CARDIAC CATHETERIZATION  04/2004   normal r-sided pressure, mild pulm HTN  . CARPAL TUNNEL RELEASE Right   . CERVICAL SPINE SURGERY  1990's  . LUMBAR SPINE SURGERY  2003  . PROSTATE SURGERY  10/2010   transurethral resection   . TOTAL HIP ARTHROPLASTY Left 01/17/2014   Procedure: LEFT TOTAL HIP ARTHROPLASTY;  Surgeon: Yvette Rack., MD;  Location: [facility];  Service: Orthopedics;  Laterality: Left;  . TRANSTHORACIC ECHOCARDIOGRAM  05/2006   EF 60-70%; mild calcif of MV; mild MV regurg; LA mildly dilated  . TRANSURETHRAL RESECTION OF PROSTATE N/A 08/22/2014   Procedure: TRANSURETHRAL RESECTION OF THE PROSTATE (TURP);  Surgeon: Marcia Brash, MD;  Location: [facility];  Service: Urology;  Laterality: N/A;   FHx:    Reviewed / unchanged  SHx:    Reviewed / unchanged  Systems Review:  Constitutional: Denies fever, chills, wt changes, headaches, insomnia, fatigue, night sweats, change in appetite. Eyes: Denies redness, blurred vision, diplopia, discharge, itchy, watery eyes.  ENT: Denies discharge, congestion, post nasal drip, epistaxis, sore throat, earache, hearing loss, dental pain, tinnitus, vertigo, sinus pain, snoring.  CV: Denies chest pain, palpitations, irregular heartbeat, syncope, dyspnea, diaphoresis,  orthopnea, PND, claudication or edema. Respiratory: denies cough, dyspnea, DOE, pleurisy, hoarseness, laryngitis, wheezing.  Gastrointestinal: Denies dysphagia, odynophagia, heartburn, reflux, water brash, abdominal pain or cramps, nausea, vomiting, bloating, diarrhea, constipation, hematemesis, melena, hematochezia  or hemorrhoids. Genitourinary: Denies dysuria, frequency, urgency, nocturia, hesitancy, discharge, hematuria or flank pain. Musculoskeletal: Denies arthralgias, myalgias, stiffness, jt. swelling, pain, limping or strain/sprain.  Skin: Denies pruritus, rash, hives, warts, acne, eczema or change in skin lesion(s). Neuro: No weakness, tremor, incoordination, spasms, paresthesia or pain. Psychiatric: Denies confusion, memory loss or sensory loss. Endo: Denies change in weight, skin or hair change.  Heme/Lymph: No excessive bleeding, bruising or enlarged lymph nodes.  Physical Exam  BP (!) 164/76   Pulse 60   Temp (!) 97.5 F (36.4 C)   Resp 18   Ht 5' 9.75" (1.772 m)   Wt 218 lb 12.8 oz (99.2 kg)   BMI 31.62 kg/m  Appears  well nourished, well groomed  and in no distress.  Eyes: PERRLA, EOMs, conjunctiva no swelling or erythema. Sinuses: No frontal/maxillary tenderness ENT/Mouth: EAC's clear, TM's nl w/o erythema, bulging. Nares clear w/o erythema, swelling, exudates. Oropharynx clear without erythema or exudates. Oral hygiene is good. Tongue normal, non obstructing. Hearing intact.  Neck: Supple. Thyroid not palpable. Car 2+/2+ without bruits, nodes or JVD. Chest: Respirations nl with BS clear & equal w/o rales, rhonchi, wheezing or stridor.  Cor: Heart sounds normal w/ regular rate and rhythm without sig. murmurs, gallops, clicks or rubs. Peripheral pulses normal and equal  without edema.  Abdomen: Soft & bowel sounds normal. Non-tender w/o guarding, rebound, hernias, masses or organomegaly.  Lymphatics: Unremarkable.  Musculoskeletal: Full ROM all peripheral extremities,  joint stability, 5/5 strength and normal gait.  Skin: Warm, dry without exposed rashes, lesions or ecchymosis apparent.  Neuro: Cranial nerves intact, reflexes equal bilaterally. Sensory-motor testing grossly intact. Tendon reflexes grossly intact.  Pysch: Alert & oriented x 3.  Insight and judgement nl & appropriate. No ideations.  Assessment and Plan:  1. Essential hypertension  - Advised to return home & take his BP meds on schedule and call if BP remains elevated over 140 sys.    - Continue DASH diet. Reminder to go to the ER if any CP,  SOB, nausea, dizziness, severe HA, changes vision/speech.  - CBC with Differential/Platelet - BASIC METABOLIC PANEL WITH GFR - Magnesium  2. Hyperlipidemia, mixed  - Continue diet/meds, exercise,& lifestyle modifications.  - Continue monitor periodic cholesterol/liver & renal functions   - Hepatic function panel - TSH  3. Type 2 diabetes mellitus with stage 3 chronic kidney disease, with long-term current use of insulin (HCC)  - Encouraged better diet compliance,  -  & exercise - Monitor appropriate labs.  - Hemoglobin A1c  4. Vitamin D deficiency  - Continue supplementation.  - VITAMIN D 25 Hydroxyl  5. Gout  - Uric acid  6. Insulin-requiring or dependent type II diabetes mellitus ([place])  - Hemoglobin A1c  7. SDAT (senile dementia of Alzheimer's type)   8. Medication management - CBC with Differential/Platelet - BASIC METABOLIC PANEL WITH GFR - Hepatic function panel - Magnesium - TSH - Hemoglobin A1c - Insulin, random - VITAMIN D 25 Hydroxyl - Uric acid      Discussed  regular exercise, BP monitoring, weight control to achieve/maintain BMI less than 25 and discussed med and SE's. Recommended labs to assess and monitor clinical status with further disposition pending results of labs. Over 30 minutes of exam, counseling, chart review was performed.

## 2017-09-06 NOTE — Patient Instructions (Signed)

## 2017-09-07 ENCOUNTER — Encounter: Payer: Self-pay | Admitting: Endocrinology

## 2017-09-07 ENCOUNTER — Ambulatory Visit: Payer: Medicare Other | Admitting: Endocrinology

## 2017-09-07 ENCOUNTER — Ambulatory Visit: Payer: Medicare Other

## 2017-09-07 VITALS — BP 170/62 | HR 84 | Wt 219.4 lb

## 2017-09-07 DIAGNOSIS — E119 Type 2 diabetes mellitus without complications: Secondary | ICD-10-CM | POA: Diagnosis not present

## 2017-09-07 DIAGNOSIS — Z794 Long term (current) use of insulin: Secondary | ICD-10-CM

## 2017-09-07 LAB — HEPATIC FUNCTION PANEL
AG Ratio: 1.6 (calc) (ref 1.0–2.5)
ALT: 24 U/L (ref 9–46)
AST: 30 U/L (ref 10–35)
Albumin: 4.5 g/dL (ref 3.6–5.1)
Alkaline phosphatase (APISO): 58 U/L (ref 40–115)
BILIRUBIN DIRECT: 0.1 mg/dL (ref 0.0–0.2)
BILIRUBIN INDIRECT: 0.4 mg/dL (ref 0.2–1.2)
BILIRUBIN TOTAL: 0.5 mg/dL (ref 0.2–1.2)
Globulin: 2.8 g/dL (calc) (ref 1.9–3.7)
Total Protein: 7.3 g/dL (ref 6.1–8.1)

## 2017-09-07 LAB — BASIC METABOLIC PANEL WITH GFR
BUN / CREAT RATIO: 12 (calc) (ref 6–22)
BUN: 28 mg/dL — ABNORMAL HIGH (ref 7–25)
CHLORIDE: 102 mmol/L (ref 98–110)
CO2: 29 mmol/L (ref 20–32)
Calcium: 9.7 mg/dL (ref 8.6–10.3)
Creat: 2.36 mg/dL — ABNORMAL HIGH (ref 0.70–1.11)
GFR, EST AFRICAN AMERICAN: 27 mL/min/{1.73_m2} — AB (ref >=60)
GFR, EST NON AFRICAN AMERICAN: 24 mL/min/{1.73_m2} — AB (ref >=60)
Glucose, Bld: 108 mg/dL — ABNORMAL HIGH (ref 65–99)
POTASSIUM: 3.6 mmol/L (ref 3.5–5.3)
Sodium: 139 mmol/L (ref 135–146)

## 2017-09-07 LAB — CBC WITH DIFFERENTIAL/PLATELET
BASOS ABS: 72 {cells}/uL (ref 0–200)
Basophils Relative: 1.1 %
EOS ABS: 150 {cells}/uL (ref 15–500)
Eosinophils Relative: 2.3 %
HCT: 38.3 % — ABNORMAL LOW (ref 38.5–50.0)
Hemoglobin: 12.7 g/dL — ABNORMAL LOW (ref 13.2–17.1)
Lymphs Abs: 2548 cells/uL (ref 850–3900)
MCH: 28.5 pg (ref 27.0–33.0)
MCHC: 33.2 g/dL (ref 32.0–36.0)
MCV: 86.1 fL (ref 80.0–100.0)
MPV: 12.5 fL (ref 7.5–12.5)
Monocytes Relative: 8.9 %
Neutro Abs: 3153 cells/uL (ref 1500–7800)
Neutrophils Relative %: 48.5 %
PLATELETS: 177 10*3/uL (ref 140–400)
RBC: 4.45 10*6/uL (ref 4.20–5.80)
RDW: 12.5 % (ref 11.0–15.0)
TOTAL LYMPHOCYTE: 39.2 %
WBC: 6.5 10*3/uL (ref 3.8–10.8)
WBCMIX: 579 {cells}/uL (ref 200–950)

## 2017-09-07 LAB — MAGNESIUM: MAGNESIUM: 2.1 mg/dL (ref 1.5–2.5)

## 2017-09-07 LAB — HEMOGLOBIN A1C
EAG (MMOL/L): 10.8 (calc)
Hgb A1c MFr Bld: 8.4 % of total Hgb — ABNORMAL HIGH (ref <5.7)
Mean Plasma Glucose: 194 (calc)

## 2017-09-07 LAB — VITAMIN D 25 HYDROXY (VIT D DEFICIENCY, FRACTURES): VIT D 25 HYDROXY: 102 ng/mL — AB (ref 30–100)

## 2017-09-07 LAB — INSULIN, RANDOM: INSULIN: 52.5 u[IU]/mL — AB (ref 2.0–19.6)

## 2017-09-07 LAB — TSH: TSH: 2.09 mIU/L (ref 0.40–4.50)

## 2017-09-07 LAB — URIC ACID: Uric Acid, Serum: 6.1 mg/dL (ref 4.0–8.0)

## 2017-09-07 NOTE — Patient Instructions (Addendum)
Your blood pressure is high today.  Please see your primary care provider soon, to have it rechecked.   check your blood sugar 6 times a day--before the 3 meals, and at bedtime.  also check if you have symptoms of your blood sugar being too high or too low.  please keep a record of the readings and bring it to your next appointment here.  please call us sooner if you are having low blood sugar episodes.   On this type of insulin schedule, you should eat meals on a regular schedule.  If a meal is missed or significantly delayed, your blood sugar could go low.  Please continue the same insulin.   Please come back for a follow-up appointment in 3-4 months.

## 2017-09-07 NOTE — Progress Notes (Signed)
Subjective:    Patient ID: Gregory Hatfield, male    DOB: Oct 23, 1925, 84 y.o.   MRN: [id number]  HPI Pt returns for f/u of diabetes mellitus: DM type: Insulin-requiring type 2 Dx'ed: 1093 Complications: renal insuff, retinopathy, CAD, and CHF.   Therapy: insulin since 2003 DKA: never Severe hypoglycemia: never.   Pancreatitis: never. Other: he is on a BID insulin regimen, due to noncompliance with multiple daily injections.   Interval history: He takes 50 units qam and 25 units qpm.  He brings a record of his cbg's which I have reviewed today.  It varies from 114-300's.  There is no trend throughout the day.   Past Medical History:  Diagnosis Date  . Bradycardia 10/06/2016  . CHF (congestive heart failure) ([place])   . CKD stage 4 due to type 2 diabetes mellitus ([place]) 06/03/2015  . Dyslipidemia associated with type 2 diabetes mellitus ([place]) 05/05/2015  . Essential hypertension 05/05/2015  . IDDM (insulin dependent diabetes mellitus) ([place])   . SDAT (senile dementia of Alzheimer's type) 06/16/2013  . Uncontrolled insulin-dependent diabetes mellitus with renal manifestation ([place]) 05/05/2015    Past Surgical History:  Procedure Laterality Date  . CARDIAC CATHETERIZATION  04/2004   normal r-sided pressure, mild pulm HTN  . CARPAL TUNNEL RELEASE Right   . CERVICAL SPINE SURGERY  1990's  . LUMBAR SPINE SURGERY  2003  . PROSTATE SURGERY  10/2010   transurethral resection   . TOTAL HIP ARTHROPLASTY Left 01/17/2014   Procedure: LEFT TOTAL HIP ARTHROPLASTY;  Surgeon: Yvette Rack., MD;  Location: [facility];  Service: Orthopedics;  Laterality: Left;  . TRANSTHORACIC ECHOCARDIOGRAM  05/2006   EF 60-70%; mild calcif of MV; mild MV regurg; LA mildly dilated  . TRANSURETHRAL RESECTION OF PROSTATE N/A 08/22/2014   Procedure: TRANSURETHRAL RESECTION OF THE PROSTATE (TURP);  Surgeon: Marcia Brash, MD;  Location: [facility];  Service: Urology;  Laterality: N/A;    Social History   Socioeconomic  History  . Marital status: Widowed    Spouse name: Not on file  . Number of children: 5  . Years of education: master's  . Highest education level: Not on file  Occupational History  . Occupation: Therapist, music: RETIRED  Social Needs  . Financial resource strain: Not on file  . Food insecurity:    Worry: Not on file    Inability: Not on file  . Transportation needs:    Medical: Not on file    Non-medical: Not on file  Tobacco Use  . Smoking status: Former Smoker    Last attempt to quit: 06/21/1983    Years since quitting: 34.2  . Smokeless tobacco: Never Used  Substance and Sexual Activity  . Alcohol use: No  . Drug use: No  . Sexual activity: Never  Lifestyle  . Physical activity:    Days per week: Not on file    Minutes per session: Not on file  . Stress: Not on file  Relationships  . Social connections:    Talks on phone: Not on file    Gets together: Not on file    Attends religious service: Not on file    Active member of club or organization: Not on file    Attends meetings of clubs or organizations: Not on file    Relationship status: Not on file  . Intimate partner violence:    Fear of current or ex partner: Not on file    Emotionally abused: Not  on file    Physically abused: Not on file    Forced sexual activity: Not on file  Other Topics Concern  . Not on file  Social History Narrative  . Not on file    Current Outpatient Medications on File Prior to Visit  Medication Sig Dispense Refill  . amLODipine (NORVASC) 5 MG tablet Take 1 tablet (5 mg total) by mouth daily. 90 tablet 3  . aspirin 81 MG tablet Take 81 mg by mouth daily.    . B-D UF III MINI PEN NEEDLES 31G X 5 MM MISC USE FOR INJECTIONS AS DIRECTED 100 each 0  . carvedilol (COREG) 6.25 MG tablet TAKE 1 TABLET BY MOUTH TWICE DAILY WITH A MEAL. 180 tablet 1  . Cholecalciferol (VITAMIN D3) 5000 UNITS CAPS Take 5,000 Units by mouth 2 (two) times daily.     Marland Kitchen  dextromethorphan-guaiFENesin (MUCINEX DM) 30-600 MG 12hr tablet Take 1 tablet by mouth 2 (two) times daily.    . furosemide (LASIX) 40 MG tablet TAKE 1 OR 2 TABLETS ONCE DAILY FOR BLOOD PRESSURE AND ANKLE SWELLING. 180 tablet 0  . hydrALAZINE (APRESOLINE) 50 MG tablet TAKE 1 TABLET BY MOUTH 3 TIMES A DAY. 90 tablet 2  . hyoscyamine (LEVSIN/SL) 0.125 MG SL tablet Place 1 tablet (0.125 mg total) under the tongue every 4 (four) hours as needed. 30 tablet 0  . Insulin Detemir (LEVEMIR FLEXTOUCH) 100 UNIT/ML Pen 50 units in the am and 25 units in the pm 30 mL 2  . LUMIGAN 0.01 % SOLN Place 1 drop into the right eye at bedtime.     . Multiple Vitamin (MULTIVITAMIN) tablet Take 1 tablet by mouth daily.      Marland Kitchen MYRBETRIQ 50 MG TB24 tablet Take 50 mg by mouth daily.     . ONE TOUCH ULTRA TEST test strip CHECK BLOOD SUGAR 6 TIMES A DAY. 540 each 3  . pravastatin (PRAVACHOL) 40 MG tablet TAKE 1 TABLET ONCE DAILY FOR CHOLESTEROL. 30 tablet 2  . Probiotic Product (PROBIOTIC DAILY PO) Take 1 tablet by mouth daily.     Marland Kitchen senna-docusate (SENOKOT-S) 8.6-50 MG tablet Take 1 tablet by mouth at bedtime. (Patient taking differently: Take 1 tablet by mouth daily. )    . SIMBRINZA 1-0.2 % SUSP Place 1 drop into both eyes 3 (three) times daily.     No current facility-administered medications on file prior to visit.     Allergies  Allergen Reactions  . Ace Inhibitors Other (See Comments) and Cough    Chronic cough  . Demerol Other (See Comments)    Hard to wake up  . Cosopt [Dorzolamide Hcl-Timolol Mal] Other (See Comments)    Cause heart rate to drop  . Flomax [Tamsulosin Hcl] Other (See Comments)    Low BP  . Morphine And Related Other (See Comments)    hallucinations  . Oxytrol [Oxybutynin] Rash    Family History  Problem Relation Age of Onset  . Diabetes Mother   . Hypertension Mother   . Prostate cancer Father   . Heart disease Father   . Diabetes Brother   . Cancer Brother   . Heart disease  Brother   . Diabetes Sister   . Heart disease Sister   . Hyperlipidemia Child   . Hypertension Child   . Diabetes Child   . Heart attack Child   . Cancer Child     BP (!) 170/62 (BP Location: Left Arm, Patient Position: Sitting, Cuff Size: Normal)  Pulse 84   Wt 219 lb 6.4 oz (99.5 kg)   SpO2 97%   BMI 31.71 kg/m    Review of Systems He denies hypoglycemia    Objective:   Physical Exam VITAL SIGNS:  See vs page.  GENERAL: no distress.  Pulses: foot pulses are intact bilaterally.   MSK: no deformity of the feet or ankles.  CV: 2+ bilat edema of the legs Skin:  no ulcer on the feet or ankles.  normal temp on the feet and ankles. There is spotty hyperpigmentation and severe scaling on the legs and feet Neuro: sensation is intact to touch on the feet and ankles.   Ext: There is bilateral onychomycosis of the toenails.  Lab Results  Component Value Date   HGBA1C 8.4 (H) 09/06/2017       Assessment & Plan:  Insulin-requiring type 2 DM: this is the best control this pt should aim for, given variable cbg's, and advanced age.  Patient Instructions  Your blood pressure is high today.  Please see your primary care provider soon, to have it rechecked.   check your blood sugar 6 times a day--before the 3 meals, and at bedtime.  also check if you have symptoms of your blood sugar being too high or too low.  please keep a record of the readings and bring it to your next appointment here.  please call us sooner if you are having low blood sugar episodes.   On this type of insulin schedule, you should eat meals on a regular schedule.  If a meal is missed or significantly delayed, your blood sugar could go low.  Please continue the same insulin.   Please come back for a follow-up appointment in 3-4 months.

## 2017-09-07 NOTE — Progress Notes (Deleted)
Patient ID: Gregory Hatfield                 DOB: October 14, 1925                      MRN: [id number]     HPI: Gregory Hatfield is a 84 y.o. male referred by Dr. Debara Pickett to HTN clinic. PMH includes symptomatic bradycardia, CHF, DM, hypertension, CKD, and dyslipidemia.  He was also hospitalized last year with bradycardia and possible confusion with medications.  He was seen multiple times in the CVRR clinic after that and eventually understood how and when to take each of his medications.  We have not seen him for several months, but they scheduled an appointment for today as his home blood pressures seem to be going up.  He is here today with his med aide and states continued compliance with his medications.   No complaints about his health today, no dizziness, chest pain or shortness of breath.  He does endorse occasional lower extremity edema, but not enough to be bothersome at this time.  He gets many of his meals from Meals on Wheels, so he cannot control his diet too much.  Because of this he has a difficult time with his blood sugars, which seem to spike and drop regularly.  He recently bought a new blood pressure cuff and has not had it checked against the physician cuff yet.    Current HTN meds:  Amlodipine 5mg  daily Carvedilol 6.25mg  twice daily Hydralazine 50mg  TID Furosemide 40mg  twice daily  BP goal: < 140/90   Family History: Diabetes and hypertension from mother;cancer and heart disease from father; diabetes and heart disease siblings, and hyperlipidemia, hypertension, DM and cancer in cildren.  Social History: reports that he quit smoking about 33 years ago. He has never used smokeless tobacco. He reports that he does not drink alcohol or use drugs  Coffee tid with meals, no other caffeine  Diet:  Gets meals on wheels; does admit to adding salt tot many foods; now eating wheat breads, pastas  Exercise:  Has exercise bike at home, rides for 15-20 minutes 3-4 times daily.    Home  BP readings: checks 4 times per day, recently bought new cuff, has not been able to calibrate with office readings.   Over the past 2-3 weeks, most home readings were 790-240 systolic range.  Diastolic readings all WNL and HR usually in the 50's.      Intolerances: ACEI caused cough  Wt Readings from Last 3 Encounters:  09/07/17 219 lb 6.4 oz (99.5 kg)  09/06/17 218 lb 12.8 oz (99.2 kg)  05/23/17 220 lb (99.8 kg)   BP Readings from Last 3 Encounters:  09/07/17 (!) 170/62  09/06/17 (!) 164/76  08/08/17 128/62   Pulse Readings from Last 3 Encounters:  09/07/17 84  09/06/17 60  08/08/17 (!) 56    Past Medical History:  Diagnosis Date  . Bradycardia 10/06/2016  . CHF (congestive heart failure) ([place])   . CKD stage 4 due to type 2 diabetes mellitus ([place]) 06/03/2015  . Dyslipidemia associated with type 2 diabetes mellitus ([place]) 05/05/2015  . Essential hypertension 05/05/2015  . IDDM (insulin dependent diabetes mellitus) ([place])   . SDAT (senile dementia of Alzheimer's type) 06/16/2013  . Uncontrolled insulin-dependent diabetes mellitus with renal manifestation ([place]) 05/05/2015    Current Outpatient Medications on File Prior to Visit  Medication Sig Dispense Refill  . amLODipine (NORVASC) 5 MG tablet  Take 1 tablet (5 mg total) by mouth daily. 90 tablet 3  . aspirin 81 MG tablet Take 81 mg by mouth daily.    . B-D UF III MINI PEN NEEDLES 31G X 5 MM MISC USE FOR INJECTIONS AS DIRECTED 100 each 0  . carvedilol (COREG) 6.25 MG tablet TAKE 1 TABLET BY MOUTH TWICE DAILY WITH A MEAL. 180 tablet 1  . Cholecalciferol (VITAMIN D3) 5000 UNITS CAPS Take 5,000 Units by mouth 2 (two) times daily.     Marland Kitchen dextromethorphan-guaiFENesin (MUCINEX DM) 30-600 MG 12hr tablet Take 1 tablet by mouth 2 (two) times daily.    . furosemide (LASIX) 40 MG tablet TAKE 1 OR 2 TABLETS ONCE DAILY FOR BLOOD PRESSURE AND ANKLE SWELLING. 180 tablet 0  . hydrALAZINE (APRESOLINE) 50 MG tablet TAKE 1 TABLET BY MOUTH 3 TIMES A  DAY. 90 tablet 2  . hyoscyamine (LEVSIN/SL) 0.125 MG SL tablet Place 1 tablet (0.125 mg total) under the tongue every 4 (four) hours as needed. 30 tablet 0  . Insulin Detemir (LEVEMIR FLEXTOUCH) 100 UNIT/ML Pen 50 units in the am and 25 units in the pm 30 mL 2  . LUMIGAN 0.01 % SOLN Place 1 drop into the right eye at bedtime.     . Multiple Vitamin (MULTIVITAMIN) tablet Take 1 tablet by mouth daily.      Marland Kitchen MYRBETRIQ 50 MG TB24 tablet Take 50 mg by mouth daily.     . ONE TOUCH ULTRA TEST test strip CHECK BLOOD SUGAR 6 TIMES A DAY. 540 each 3  . pravastatin (PRAVACHOL) 40 MG tablet TAKE 1 TABLET ONCE DAILY FOR CHOLESTEROL. 30 tablet 2  . Probiotic Product (PROBIOTIC DAILY PO) Take 1 tablet by mouth daily.     Marland Kitchen senna-docusate (SENOKOT-S) 8.6-50 MG tablet Take 1 tablet by mouth at bedtime. (Patient taking differently: Take 1 tablet by mouth daily. )    . SIMBRINZA 1-0.2 % SUSP Place 1 drop into both eyes 3 (three) times daily.     No current facility-administered medications on file prior to visit.     Allergies  Allergen Reactions  . Ace Inhibitors Other (See Comments) and Cough    Chronic cough  . Demerol Other (See Comments)    Hard to wake up  . Cosopt [Dorzolamide Hcl-Timolol Mal] Other (See Comments)    Cause heart rate to drop  . Flomax [Tamsulosin Hcl] Other (See Comments)    Low BP  . Morphine And Related Other (See Comments)    hallucinations  . Oxytrol [Oxybutynin] Rash    There were no vitals taken for this visit.  Essential hypertension:  Blood pressure well at goal in the office today despite continued elevated readings at home.  Readings from this week at home show a downward trend, has past 5-6 readings < 219 systolic.  With his poor balance and excellent office reading today, I will not make any medication changes.  He will continue with daily home BP checks and we will see him back in 3 months for a follow up visit.     Tommy Medal PharmD CPP [facility] [address] [place] [address] 09/07/2017 1:28 PM

## 2017-09-14 ENCOUNTER — Other Ambulatory Visit: Payer: Self-pay | Admitting: *Deleted

## 2017-09-14 NOTE — Telephone Encounter (Signed)
Patient called to confirm Dr Melford Aase told him he can discontinue his Pravastatin.   Dr Melford Aase confirmed it is OK and a revised medication list was mailed to the patient. He is also taking an OTC medication call Colon Support, which Dr Melford Aase is not familiar with.  The patient states it is to promote regular bowel movements. Dr Melford Aase advised he usually suggest Miralax.

## 2017-09-18 ENCOUNTER — Other Ambulatory Visit: Payer: Self-pay | Admitting: Endocrinology

## 2017-09-20 ENCOUNTER — Telehealth: Payer: Self-pay | Admitting: Endocrinology

## 2017-09-20 ENCOUNTER — Other Ambulatory Visit: Payer: Self-pay | Admitting: Endocrinology

## 2017-09-20 NOTE — Telephone Encounter (Signed)
Insulin Detemir (LEVEMIR FLEXTOUCH) 100 UNIT/ML Pen  Patient needs refill sent into pharmacy. Patient stated he has already spoke with pharmacy. And needed to call in to the office.       [place], [place]

## 2017-09-21 ENCOUNTER — Ambulatory Visit (INDEPENDENT_AMBULATORY_CARE_PROVIDER_SITE_OTHER): Payer: Medicare Other | Admitting: Pharmacist Clinician (PhC)/ Clinical Pharmacy Specialist

## 2017-09-21 ENCOUNTER — Other Ambulatory Visit: Payer: Self-pay

## 2017-09-21 DIAGNOSIS — I10 Essential (primary) hypertension: Secondary | ICD-10-CM | POA: Diagnosis not present

## 2017-09-21 MED ORDER — INSULIN DETEMIR 100 UNIT/ML FLEXPEN: PEN_INJECTOR | SUBCUTANEOUS | 0 refills | Status: DC

## 2017-09-21 NOTE — Patient Instructions (Addendum)
  Your blood pressure today is 118/58   Check your blood pressure at home daily and keep record of the readings.  Take your BP meds as follows:  Continue with all your current medications  Bring all of your meds, your BP cuff and your record of home blood pressures to your next appointment.  Exercise as you're able, try to walk approximately 30 minutes per day.  Keep salt intake to a minimum, especially watch canned and prepared boxed foods.  Eat more fresh fruits and vegetables and fewer canned items.  Avoid eating in fast food restaurants.    HOW TO TAKE YOUR BLOOD PRESSURE: . Rest 5 minutes before taking your blood pressure. .  Don't smoke or drink caffeinated beverages for at least 30 minutes before. . Take your blood pressure before (not after) you eat. . Sit comfortably with your back supported and both feet on the floor (don't cross your legs). . Elevate your arm to heart level on a table or a desk. . Use the proper sized cuff. It should fit smoothly and snugly around your bare upper arm. There should be enough room to slip a fingertip under the cuff. The bottom edge of the cuff should be 1 inch above the crease of the elbow. . Ideally, take 3 measurements at one sitting and record the average.

## 2017-09-21 NOTE — Assessment & Plan Note (Signed)
Patient with essential hypertension, mostly controlled at this time.  Because of his balance issues and increased fall risk, I would not push his antihypertensive medications at this time.  While he does see some continued elevated readings at home, I am not sure that he is following the instructions given to him, as he notes that sometimes when he takes the pressure 2-3 times in a row, it drops each time.  He is due to see Dr. Debara Pickett for follow up later this spring and we can see him again in the future should the need arise.

## 2017-09-21 NOTE — Telephone Encounter (Signed)
I have sent to patient;'s pharmacy.  

## 2017-09-21 NOTE — Progress Notes (Signed)
Patient ID: [name]                 DOB: 1926-02-27                      MRN: [id number]     HPI: Gregory Hatfield is a 84 y.o. male referred by Dr. Debara Hatfield to HTN clinic. PMH includes symptomatic bradycardia, CHF, DM, hypertension, CKD, and dyslipidemia.  He was also hospitalized last year with bradycardia and possible confusion with medications.  He was seen multiple times in the CVRR clinic after that and eventually understood how and when to take each of his medications.  We have seen him several times in the CVRR clinic to monitor his blood pressure.  He is here today with his med aide and states continued compliance with his medications.   No complaints about his health today, no dizziness, chest pain or shortness of breath.  His furosemide is listed for an as needed dose, however he takes twice daily every day.  He gets many of his meals from Meals on Wheels, so he cannot control his diet too much.  Because of this he has a difficult time with his blood sugars, which seem to spike and drop regularly.  He states that his weight is steady and has not recently noticed any weight changes of 3 or more pounds.  No chest pains or breathing issues, no headaches or blurred vision.   2 > 180  40<150 No HA, SOB Lasix qd not prn Weight steady Cuff bought in Nov  Current HTN meds:  Amlodipine 5mg  daily Carvedilol 6.25mg  twice daily Hydralazine 50mg  TID Furosemide 40mg  twice daily  BP goal: < 140/90   Family History: Diabetes and hypertension from mother;cancer and heart disease from father; diabetes and heart disease siblings, and hyperlipidemia, hypertension, DM and cancer in cildren.  Social History: reports that he quit smoking about 33 years ago. He has never used smokeless tobacco. He reports that he does not drink alcohol or use drugs  Coffee tid with meals, no other caffeine  Diet:  Gets meals on wheels; does admit to adding salt tot many foods; now eating wheat breads,  pastas  Exercise:  Has exercise bike at home, rides for 15-20 minutes 3-4 times daily.    Home BP readings: checks 4 times per day, recently bought new cuff, an Omron that tested today in the office within 10 points of our reading Over the past 4 weeks, most home readings were 119-417 systolic range. Only 2 readings were >/= to 180 and 40 of the approx 120 readings (he checks 4x day every day) 40 were below 150.   Intolerances: ACEI caused cough  Wt Readings from Last 3 Encounters:  09/07/17 219 lb 6.4 oz (99.5 kg)  09/06/17 218 lb 12.8 oz (99.2 kg)  05/23/17 220 lb (99.8 kg)   BP Readings from Last 3 Encounters:  09/07/17 (!) 170/62  09/06/17 (!) 164/76  08/08/17 128/62   Pulse Readings from Last 3 Encounters:  09/07/17 84  09/06/17 60  08/08/17 (!) 56    Past Medical History:  Diagnosis Date  . Bradycardia 10/06/2016  . CHF (congestive heart failure) ([place])   . CKD stage 4 due to type 2 diabetes mellitus ([place]) 06/03/2015  . Dyslipidemia associated with type 2 diabetes mellitus ([place]) 05/05/2015  . Essential hypertension 05/05/2015  . IDDM (insulin dependent diabetes mellitus) ([place])   . SDAT (senile dementia of Alzheimer's type) 06/16/2013  .  Uncontrolled insulin-dependent diabetes mellitus with renal manifestation ([place]) 05/05/2015    Current Outpatient Medications on File Prior to Visit  Medication Sig Dispense Refill  . amLODipine (NORVASC) 5 MG tablet Take 1 tablet (5 mg total) by mouth daily. 90 tablet 3  . aspirin 81 MG tablet Take 81 mg by mouth daily.    . B-D UF III MINI PEN NEEDLES 31G X 5 MM MISC USE FOR INJECTIONS AS DIRECTED 100 each 0  . carvedilol (COREG) 6.25 MG tablet TAKE 1 TABLET BY MOUTH TWICE DAILY WITH A MEAL. 180 tablet 1  . Cholecalciferol (VITAMIN D3) 5000 UNITS CAPS Take 5,000 Units by mouth 2 (two) times daily.     Marland Kitchen dextromethorphan-guaiFENesin (MUCINEX DM) 30-600 MG 12hr tablet Take 1 tablet by mouth 2 (two) times daily.    . furosemide (LASIX)  40 MG tablet TAKE 1 OR 2 TABLETS ONCE DAILY FOR BLOOD PRESSURE AND ANKLE SWELLING. 180 tablet 0  . hydrALAZINE (APRESOLINE) 50 MG tablet TAKE 1 TABLET BY MOUTH 3 TIMES A DAY. 90 tablet 2  . hyoscyamine (LEVSIN/SL) 0.125 MG SL tablet Place 1 tablet (0.125 mg total) under the tongue every 4 (four) hours as needed. 30 tablet 0  . LUMIGAN 0.01 % SOLN Place 1 drop into the right eye at bedtime.     . Multiple Vitamin (MULTIVITAMIN) tablet Take 1 tablet by mouth daily.      Marland Kitchen MYRBETRIQ 50 MG TB24 tablet Take 50 mg by mouth daily.     . ONE TOUCH ULTRA TEST test strip CHECK BLOOD SUGAR 6 TIMES A DAY. 540 each 3  . Probiotic Product (PROBIOTIC DAILY PO) Take 1 tablet by mouth daily.     Marland Kitchen senna-docusate (SENOKOT-S) 8.6-50 MG tablet Take 1 tablet by mouth at bedtime. (Patient taking differently: Take 1 tablet by mouth daily. )    . SIMBRINZA 1-0.2 % SUSP Place 1 drop into both eyes 3 (three) times daily.     No current facility-administered medications on file prior to visit.     Allergies  Allergen Reactions  . Ace Inhibitors Other (See Comments) and Cough    Chronic cough  . Demerol Other (See Comments)    Hard to wake up  . Cosopt [Dorzolamide Hcl-Timolol Mal] Other (See Comments)    Cause heart rate to drop  . Flomax [Tamsulosin Hcl] Other (See Comments)    Low BP  . Morphine And Related Other (See Comments)    hallucinations  . Oxytrol [Oxybutynin] Rash    There were no vitals taken for this visit.  Essential hypertension:  Patient with essential hypertension, mostly controlled at this time.  Because of his balance issues and increased fall risk, I would not push his antihypertensive medications at this time.  While he does see some continued elevated readings at home, I am not sure that he is following the instructions given to him, as he notes that sometimes when he takes the pressure 2-3 times in a row, it drops each time.  He is due to see Dr. Debara Hatfield for follow up later this spring and  we can see him again in the future should the need arise.    Gregory Hatfield PharmD CPP [facility] [address] [place] [address] 09/21/2017 12:15 PM

## 2017-09-29 ENCOUNTER — Other Ambulatory Visit: Payer: Self-pay | Admitting: Internal Medicine

## 2017-09-29 NOTE — Telephone Encounter (Signed)
REFILL 

## 2017-10-17 ENCOUNTER — Ambulatory Visit: Payer: Medicare Other | Admitting: Podiatry

## 2017-10-17 ENCOUNTER — Ambulatory Visit: Payer: Medicare Other | Admitting: Sports Medicine

## 2017-10-17 DIAGNOSIS — B351 Tinea unguium: Secondary | ICD-10-CM

## 2017-10-17 DIAGNOSIS — E119 Type 2 diabetes mellitus without complications: Secondary | ICD-10-CM | POA: Diagnosis not present

## 2017-10-17 DIAGNOSIS — M79676 Pain in unspecified toe(s): Secondary | ICD-10-CM | POA: Diagnosis not present

## 2017-10-17 NOTE — Progress Notes (Signed)
He presents today for follow-up of painful toenails bilaterally.  He states that he like that the nails cut they have been bothering him.  Objective: Vital signs are stable he is alert and oriented x3 pulses are palpable.  Skin is dry and xerotic toenails are thick yellow dystrophic with mycotic no open lesions or wounds.  Assessment: Pain in limbs and onychomycosis.  Plan: Debridement of toenails 1 through 5 bilateral.  Follow-up with [place] on an as-needed basis.

## 2017-10-24 ENCOUNTER — Other Ambulatory Visit: Payer: Self-pay | Admitting: Adult Health

## 2017-10-30 ENCOUNTER — Other Ambulatory Visit: Payer: Self-pay | Admitting: Internal Medicine

## 2017-10-30 ENCOUNTER — Telehealth: Payer: Self-pay | Admitting: Internal Medicine

## 2017-10-30 NOTE — Telephone Encounter (Signed)
°*  STAT* If patient is at the pharmacy, call can be transferred to refill team.   1. Which medications need to be refilled? (please list name of each medication and dose if known)  amLODipine (NORVASC) 5 MG tablet(Expired) Take 1 tablet (5 mg total) by mouth daily.   hydrALAZINE (APRESOLINE) 50 MG tablet Take 1 tablet (50 mg total) by mouth 3 (three) times daily. NEED OV.   2. Which pharmacy/location (including street and city if local pharmacy) is medication to be sent to? [facility]  3. Do they need a 30 day or 90 day supply? [place]

## 2017-10-30 NOTE — Telephone Encounter (Signed)
Rx has been sent to the pharmacy electronically. ° °

## 2017-10-31 MED ORDER — HYDRALAZINE HCL 50 MG PO TABS: 50.0000 mg | ORAL_TABLET | Freq: Three times a day (TID) | ORAL | 0 refills | Status: DC

## 2017-11-04 ENCOUNTER — Other Ambulatory Visit: Payer: Self-pay | Admitting: Endocrinology

## 2017-11-27 ENCOUNTER — Other Ambulatory Visit: Payer: Self-pay | Admitting: Endocrinology

## 2017-11-28 ENCOUNTER — Ambulatory Visit: Payer: Medicare Other | Admitting: Physician Assistant

## 2017-12-12 ENCOUNTER — Encounter: Payer: Self-pay | Admitting: Endocrinology

## 2017-12-12 ENCOUNTER — Ambulatory Visit: Payer: Medicare Other | Admitting: Endocrinology

## 2017-12-12 VITALS — BP 156/64 | HR 63 | Ht 71.0 in | Wt 223.4 lb

## 2017-12-12 DIAGNOSIS — N183 Chronic kidney disease, stage 3 (moderate): Secondary | ICD-10-CM | POA: Diagnosis not present

## 2017-12-12 DIAGNOSIS — Z794 Long term (current) use of insulin: Secondary | ICD-10-CM

## 2017-12-12 DIAGNOSIS — E1122 Type 2 diabetes mellitus with diabetic chronic kidney disease: Secondary | ICD-10-CM | POA: Diagnosis not present

## 2017-12-12 LAB — POCT GLYCOSYLATED HEMOGLOBIN (HGB A1C): Hemoglobin A1C: 8.2 % — AB (ref 4.0–5.6)

## 2017-12-12 NOTE — Progress Notes (Signed)
Subjective:    Patient ID: Gregory Hatfield, male    DOB: 1925-09-30, 84 y.o.   MRN: [id number]  HPI Pt returns for f/u of diabetes mellitus: DM type: Insulin-requiring type 2 Dx'ed: 5361 Complications: renal insuff, retinopathy, CAD, and CHF.   Therapy: insulin since 2003 DKA: never Severe hypoglycemia: never.   Pancreatitis: never. Other: he is on a BID insulin regimen, due to noncompliance with multiple daily injections.   Interval history: He brings a record of his cbg's which I have reviewed today.  It varies from 87-290.  There is no trend throughout the day.  pt states he feels well in general.   Past Medical History:  Diagnosis Date  . Bradycardia 10/06/2016  . CHF (congestive heart failure) ([place])   . CKD stage 4 due to type 2 diabetes mellitus ([place]) 06/03/2015  . Dyslipidemia associated with type 2 diabetes mellitus ([place]) 05/05/2015  . Essential hypertension 05/05/2015  . IDDM (insulin dependent diabetes mellitus) ([place])   . SDAT (senile dementia of Alzheimer's type) 06/16/2013  . Uncontrolled insulin-dependent diabetes mellitus with renal manifestation ([place]) 05/05/2015    Past Surgical History:  Procedure Laterality Date  . CARDIAC CATHETERIZATION  04/2004   normal r-sided pressure, mild pulm HTN  . CARPAL TUNNEL RELEASE Right   . CERVICAL SPINE SURGERY  1990's  . LUMBAR SPINE SURGERY  2003  . PROSTATE SURGERY  10/2010   transurethral resection   . TOTAL HIP ARTHROPLASTY Left 01/17/2014   Procedure: LEFT TOTAL HIP ARTHROPLASTY;  Surgeon: Yvette Rack., MD;  Location: [facility];  Service: Orthopedics;  Laterality: Left;  . TRANSTHORACIC ECHOCARDIOGRAM  05/2006   EF 60-70%; mild calcif of MV; mild MV regurg; LA mildly dilated  . TRANSURETHRAL RESECTION OF PROSTATE N/A 08/22/2014   Procedure: TRANSURETHRAL RESECTION OF THE PROSTATE (TURP);  Surgeon: Marcia Brash, MD;  Location: [facility];  Service: Urology;  Laterality: N/A;    Social History   Socioeconomic History   . Marital status: Widowed    Spouse name: Not on file  . Number of children: 5  . Years of education: master's  . Highest education level: Not on file  Occupational History  . Occupation: Therapist, music: RETIRED  Social Needs  . Financial resource strain: Not on file  . Food insecurity:    Worry: Not on file    Inability: Not on file  . Transportation needs:    Medical: Not on file    Non-medical: Not on file  Tobacco Use  . Smoking status: Former Smoker    Last attempt to quit: 06/21/1983    Years since quitting: 34.5  . Smokeless tobacco: Never Used  Substance and Sexual Activity  . Alcohol use: No  . Drug use: No  . Sexual activity: Never  Lifestyle  . Physical activity:    Days per week: Not on file    Minutes per session: Not on file  . Stress: Not on file  Relationships  . Social connections:    Talks on phone: Not on file    Gets together: Not on file    Attends religious service: Not on file    Active member of club or organization: Not on file    Attends meetings of clubs or organizations: Not on file    Relationship status: Not on file  . Intimate partner violence:    Fear of current or ex partner: Not on file    Emotionally abused: Not on file  Physically abused: Not on file    Forced sexual activity: Not on file  Other Topics Concern  . Not on file  Social History Narrative  . Not on file    Current Outpatient Medications on File Prior to Visit  Medication Sig Dispense Refill  . amLODipine (NORVASC) 5 MG tablet TAKE 1 TABLET ONCE DAILY. 90 tablet 0  . aspirin 81 MG tablet Take 81 mg by mouth daily.    . B-D UF III MINI PEN NEEDLES 31G X 5 MM MISC USE FOR INJECTIONS AS DIRECTED 100 each 0  . carvedilol (COREG) 6.25 MG tablet TAKE 1 TABLET BY MOUTH TWICE DAILY WITH A MEAL. 180 tablet 1  . Cholecalciferol (VITAMIN D3) 5000 UNITS CAPS Take 5,000 Units by mouth 2 (two) times daily.     Marland Kitchen dextromethorphan-guaiFENesin (MUCINEX DM) 30-600  MG 12hr tablet Take 1 tablet by mouth 2 (two) times daily.    . furosemide (LASIX) 40 MG tablet TAKE 1 OR 2 TABLETS ONCE DAILY FOR BLOOD PRESSURE AND ANKLE SWELLING. 180 tablet 0  . hydrALAZINE (APRESOLINE) 50 MG tablet Take 1 tablet (50 mg total) by mouth 3 (three) times daily. KEEP OV. 270 tablet 0  . hyoscyamine (LEVSIN/SL) 0.125 MG SL tablet Place 1 tablet (0.125 mg total) under the tongue every 4 (four) hours as needed. 30 tablet 0  . LEVEMIR FLEXTOUCH 100 UNIT/ML Pen INJECT 50 UNITS IN THE AM AND 25 UNITS IN THE PM 15 mL 0  . LUMIGAN 0.01 % SOLN Place 1 drop into the right eye at bedtime.     . Multiple Vitamin (MULTIVITAMIN) tablet Take 1 tablet by mouth daily.      Marland Kitchen MYRBETRIQ 50 MG TB24 tablet Take 50 mg by mouth daily.     . ONE TOUCH ULTRA TEST test strip CHECK BLOOD SUGAR 6 TIMES A DAY. 540 each 3  . Probiotic Product (PROBIOTIC DAILY PO) Take 1 tablet by mouth daily.     Marland Kitchen senna-docusate (SENOKOT-S) 8.6-50 MG tablet Take 1 tablet by mouth at bedtime. (Patient taking differently: Take 1 tablet by mouth daily. )    . SIMBRINZA 1-0.2 % SUSP Place 1 drop into both eyes 3 (three) times daily.     No current facility-administered medications on file prior to visit.     Allergies  Allergen Reactions  . Ace Inhibitors Other (See Comments) and Cough    Chronic cough  . Demerol Other (See Comments)    Hard to wake up  . Cosopt [Dorzolamide Hcl-Timolol Mal] Other (See Comments)    Cause heart rate to drop  . Flomax [Tamsulosin Hcl] Other (See Comments)    Low BP  . Morphine And Related Other (See Comments)    hallucinations  . Oxytrol [Oxybutynin] Rash    Family History  Problem Relation Age of Onset  . Diabetes Mother   . Hypertension Mother   . Prostate cancer Father   . Heart disease Father   . Diabetes Brother   . Cancer Brother   . Heart disease Brother   . Diabetes Sister   . Heart disease Sister   . Hyperlipidemia Child   . Hypertension Child   . Diabetes Child     . Heart attack Child   . Cancer Child     BP (!) 156/64 (BP Location: Left Arm, Patient Position: Sitting, Cuff Size: Normal)   Pulse 63   Ht 5\' 11"  (1.803 m)   Wt 223 lb 6.4 oz (101.3 kg)  SpO2 95%   BMI 31.16 kg/m    Review of Systems He denies hypoglycemia    Objective:   Physical Exam VITAL SIGNS:  See vs page.  GENERAL: no distress.  Pulses: foot pulses are intact bilaterally.   MSK: no deformity of the feet or ankles.  CV: 1+ bilat edema of the legs Skin:  no ulcer on the feet or ankles.  normal temp on the feet and ankles. There is severe hyperpigmentation and scaling on the legs and feet.   Neuro: sensation is intact to touch on the feet and ankles.  Ext: There is bilateral onychomycosis of the toenails.   A1c=8.2%    Assessment & Plan:  Insulin-requiring type 2 DM, with CAD renal insuff: this increases the risk of hypoglycemia. HTN: is noted today Frail elderly state: he is not a candidate for aggressive glycemic control.  Patient Instructions  Your blood pressure is high today.  Please see your primary care provider soon, to have it rechecked.   check your blood sugar 6 times a day--before the 3 meals, and at bedtime.  also check if you have symptoms of your blood sugar being too high or too low.  please keep a record of the readings and bring it to your next appointment here.  please call us sooner if you are having low blood sugar episodes.   On this type of insulin schedule, you should eat meals on a regular schedule.  If a meal is missed or significantly delayed, your blood sugar could go low.  Please continue the same insulin.   Please come back for a follow-up appointment in 4 months.

## 2017-12-12 NOTE — Patient Instructions (Addendum)
Your blood pressure is high today.  Please see your primary care provider soon, to have it rechecked.   check your blood sugar 6 times a day--before the 3 meals, and at bedtime.  also check if you have symptoms of your blood sugar being too high or too low.  please keep a record of the readings and bring it to your next appointment here.  please call us sooner if you are having low blood sugar episodes.   On this type of insulin schedule, you should eat meals on a regular schedule.  If a meal is missed or significantly delayed, your blood sugar could go low.  Please continue the same insulin.   Please come back for a follow-up appointment in 4 months.

## 2017-12-19 NOTE — Progress Notes (Signed)
Patient ID: Gregory Hatfield, male   DOB: 07/03/1925, 84 y.o.   MRN: [id number]  MEDICARE ANNUAL WELLNESS VISIT AND OV  Assessment:   Encounter for Medicare annual wellness exam  Essential hypertension - continue medications, DASH diet, exercise and monitor at home. Call if greater than 130/80.   CKD stage 4 due to type 2 diabetes mellitus ([place]) Discussed general issues about diabetes pathophysiology and management., Educational material distributed., Suggested low cholesterol diet., Encouraged aerobic exercise., Discussed foot care., Reminded to get yearly retinal exam. - COMPLETE METABOLIC PANEL WITH GFR   Dyslipidemia associated with type 2 diabetes mellitus ([place]) -continue medications, check lipids, decrease fatty foods, increase activity.    Vitamin D deficiency Continue supplementation Check vitamin D level   Medication management continue follow up  Open-angle glaucoma, unspecified glaucoma stage, unspecified laterality, unspecified open-angle glaucoma type Follow up opthalmology  SDAT (senile dementia of Alzheimer's type) Discussing assisted living, quite resistant, continue to monitor needs aid with medications/food, etc. - aide during day, granddaughter stays with him at night   Prostate cancer ([place]) Continue follow up  Proliferative diabetic retinopathy associated with type 2 diabetes mellitus, macular edema presence unspecified ([place]) continue follow up  Chronic diastolic CHF (congestive heart failure) ([place]) Monitor weight  Insulin-requiring or dependent type II diabetes mellitus ([place]) Discussed general issues about diabetes pathophysiology and management., Educational material distributed., Suggested low cholesterol diet., Encouraged aerobic exercise., Discussed foot care., Reminded to get yearly retinal exam. Goal is around 8 for A1C, monitor closely  Diabetic retinopathy without macular edema associated with diabetes mellitus due to underlying condition,  unspecified laterality, unspecified retinopathy severity ([place]) Discussed general issues about diabetes pathophysiology and management., Educational material distributed., Suggested low cholesterol diet., Encouraged aerobic exercise., Discussed foot care., Reminded to get yearly retinal exam.  Symptomatic bradycardia Improved with decrease in meds  Urinary incontinence/frequency/agitation Labs and UA for possible UTI; as office will be closed for next 4 days will send in doxycycline and have him start on this BID Additionally discussed low dose haldol- risks and benefits -with caregiver who shares he has become quite unmanageable in this past week; will send in 1 mg dose, advised start with 1/2 tab twice daily and evaluate benefit. May increase to TID if helpful. Decrease dose with any signs of excessive sedation. Will do close follow up in 7-10 days.   Future Appointments  Date Time Provider [place]  12/27/2017  3:30 PM Almyra Deforest, [place] [facility]  12/29/2017 11:30 AM Liane Comber, NP [facility] None  01/09/2018 10:00 AM Renato Shin, MD [facility] None  01/16/2018 10:45 AM Garrel Ridgel, DPM [facility]  03/22/2018 11:00 AM [name], MD [facility] None       Plan:   During the course of the visit the patient was educated and counseled about appropriate screening and preventive services including:    Pneumococcal vaccine   Influenza vaccine  Td vaccine  Screening electrocardiogram  Bone densitometry screening  Colorectal cancer screening  Diabetes screening  Glaucoma screening  Nutrition counseling   Advanced directives: requested  Subjective:  Gregory Hatfield  presents for Medicare Annual Wellness Visit and OV.  Patient has SDAT and is no longer able to live independently due to medical noncompliance and meals, but still at home, walking with a walker, has caregiver/aide during the day and grand daughter stays with him in the  evenings. She is healtcare POA.  The family called prior to visit to report increased voiding/incontinence, seems confused, and inappropriate  behavior (aide reports yelling, throwing items, "being ill") that is unusual for him. Reports this seemed to start suddenly about 5 days ago. He reportedly wears depends at home for incontinence, presents today well dressed but smelling of urine. This is my first interaction with this patient today, he appears pleasant if somewhat rambling and easily distracted.   Patient has GERD which is controlled with diet and IBS for which he occasionally takes hyoscyamine. Other problems include Depression, controlled or in remission and lastly hx/o of low grade Prostate Cancer monitored by Dr Gaynelle Arabian.  He has had elevated blood pressure since 1992. His blood pressure has been controlled at home, BP: (!) 142/78 He does not workout. He denies chest pain, shortness of breath, dizziness.   BMI is Body mass index is 31.24 kg/m., he has been working on diet and exercise - rides stationary bike ~30 min daily Wt Readings from Last 3 Encounters:  12/20/17 224 lb (101.6 kg)  12/12/17 223 lb 6.4 oz (101.3 kg)  09/21/17 218 lb (98.9 kg)   He is not on cholesterol medication and denies myalgias. His cholesterol is at goal. The cholesterol last visit was:  Lab Results  Component Value Date   CHOL 139 05/23/2017   HDL 47 05/23/2017   LDLCALC 73 05/23/2017   TRIG 102 05/23/2017   CHOLHDL 3.0 05/23/2017   He has had diabetes for 43 years since 1973 - initially treated with oral agents but started on Insulin in 1997. Currently he's followed by Dr Loanne Drilling who has innovated a protocol of loose control given the patient's limited cognitive capacity and insight in comprehending a vigorous regimen, on insulin, on 50 units in the AM, 25 units in the PM. He presents with sugar chart, fasting glucoses for this past week range 160-260. Post prandial values range up into 300s. No  hypoglycemic values.  He has been working on diet and exercise and denies foot ulcerations, hyperglycemia, hypoglycemia , nausea, paresthesia of the feet, polydipsia, polyuria and visual disturbances. Last A1C in the office was:  Lab Results  Component Value Date   HGBA1C 8.2 (A) 12/12/2017   Patient is on Vitamin D supplement.   Lab Results  Component Value Date   VD25OH 102 (H) 09/06/2017     He has stage IV CKD followed by Dr. Mercy Moore at [facility] Results  Component Value Date   GFRAA 27 (L) 09/06/2017      Medication Review: Current Outpatient Medications on File Prior to Visit  Medication Sig Dispense Refill  . amLODipine (NORVASC) 5 MG tablet TAKE 1 TABLET ONCE DAILY. 90 tablet 0  . aspirin 81 MG tablet Take 81 mg by mouth daily.    . B-D UF III MINI PEN NEEDLES 31G X 5 MM MISC USE FOR INJECTIONS AS DIRECTED 100 each 0  . carvedilol (COREG) 6.25 MG tablet TAKE 1 TABLET BY MOUTH TWICE DAILY WITH A MEAL. 180 tablet 1  . Cholecalciferol (VITAMIN D3) 5000 UNITS CAPS Take 5,000 Units by mouth 2 (two) times daily.     Marland Kitchen dextromethorphan-guaiFENesin (MUCINEX DM) 30-600 MG 12hr tablet Take 1 tablet by mouth 2 (two) times daily.    . furosemide (LASIX) 40 MG tablet TAKE 1 OR 2 TABLETS ONCE DAILY FOR BLOOD PRESSURE AND ANKLE SWELLING. 180 tablet 0  . hydrALAZINE (APRESOLINE) 50 MG tablet Take 1 tablet (50 mg total) by mouth 3 (three) times daily. KEEP OV. 270 tablet 0  . hyoscyamine (LEVSIN/SL) 0.125 MG SL tablet Place 1  tablet (0.125 mg total) under the tongue every 4 (four) hours as needed. 30 tablet 0  . LEVEMIR FLEXTOUCH 100 UNIT/ML Pen INJECT 50 UNITS IN THE AM AND 25 UNITS IN THE PM 15 mL 0  . LUMIGAN 0.01 % SOLN Place 1 drop into the right eye at bedtime.     . Multiple Vitamin (MULTIVITAMIN) tablet Take 1 tablet by mouth daily.      Marland Kitchen MYRBETRIQ 50 MG TB24 tablet Take 50 mg by mouth daily.     . ONE TOUCH ULTRA TEST test strip CHECK BLOOD SUGAR 6 TIMES A DAY. 540 each  3  . Probiotic Product (PROBIOTIC DAILY PO) Take 1 tablet by mouth daily.     Marland Kitchen senna-docusate (SENOKOT-S) 8.6-50 MG tablet Take 1 tablet by mouth at bedtime. (Patient taking differently: Take 1 tablet by mouth daily. )    . SIMBRINZA 1-0.2 % SUSP Place 1 drop into both eyes 3 (three) times daily.     No current facility-administered medications on file prior to visit.     Current Problems (verified) Patient Active Problem List   Diagnosis Date Noted  . Agitation 12/20/2017  . Symptomatic bradycardia 10/06/2016  . Diabetes mellitus with complication ([place]) 62/70/3500  . Chronic diastolic CHF (congestive heart failure) ([place]) 10/06/2016  . Hyperlipidemia 01/12/2016  . Prostate cancer ([place]) 10/12/2015  . Diabetic retinopathy ([place]) 10/12/2015  . Encounter for Medicare annual wellness exam 10/12/2015  . Open-angle glaucoma 07/06/2015  . CKD stage 4 due to type 2 diabetes mellitus ([place]) 06/03/2015  . Vitamin D deficiency 06/03/2015  . Medication management 06/03/2015  . Insulin-requiring or dependent type II diabetes mellitus ([place]) 05/05/2015  . Essential hypertension 05/05/2015  . SDAT (senile dementia of Alzheimer's type) 06/16/2013   Screening Tests Immunization History  Administered Date(s) Administered  . DTaP 09/28/2006  . Influenza Whole 04/05/2010, 04/04/2013  . Influenza, High Dose Seasonal PF 04/02/2014, 03/30/2015  . Influenza-Unspecified 02/19/2011  . Pneumococcal Conjugate-13 06/10/2014  . Pneumococcal Polysaccharide-23 07/21/2001, 03/20/2008  . Td 08/28/2008   Preventative care: Last colonoscopy: 2010- recc 10 yr f/u  Echo 04/2015 55-60% CT head 04/2015 Renal US 2007  Influenza 03/2015 Prenvar 13 2015 Pneumonia 2009 Tetantus 2010 Zoster- declines  Names of Other Physician/Practitioners you currently use: 1. [facility] here for primary care 2. Dr Lanell Matar, eye doctor, last visit Dec 2018 for Glaucoma monitoring 3. Dr's  Loistine Simas, DDS, dentist, last visit 09/2017  Patient Care Team: [name], MD as PCP - General (Internal Medicine) [name], MD as PCP - Internal Medicine (Internal Medicine) Renato Shin, MD as Consulting Physician (Endocrinology) Venetia Maxon, Hyacinth Meeker (Optometry) Earlie Server, MD as Consulting Physician (Orthopedic Surgery) Carolan Clines, MD as Consulting Physician (Urology) Laurence Spates, MD as Consulting Physician (Gastroenterology) Fleet Contras, MD as Consulting Physician (Nephrology) Ninetta Lights, MD as Consulting Physician (Orthopedic Surgery) Pixie Casino, MD as Consulting Physician (Cardiology) Teena Irani, MD (Inactive) as Consulting Physician (Gastroenterology)   History reviewed: allergies, current medications, past family history, past medical history, past social history, past surgical history and problem list  Allergies Allergies  Allergen Reactions  . Ace Inhibitors Other (See Comments) and Cough    Chronic cough  . Demerol Other (See Comments)    Hard to wake up  . Cosopt [Dorzolamide Hcl-Timolol Mal] Other (See Comments)    Cause heart rate to drop  . Flomax [Tamsulosin Hcl] Other (See Comments)    Low BP  . Morphine And  Related Other (See Comments)    hallucinations  . Oxytrol [Oxybutynin] Rash    SURGICAL HISTORY He  has a past surgical history that includes Cardiac catheterization (04/2004); Prostate surgery (10/2010); transthoracic echocardiogram (05/2006); Carpal tunnel release (Right); Lumbar spine surgery (2003); Cervical spine surgery (1990's); Total hip arthroplasty (Left, 01/17/2014); and Transurethral resection of prostate (N/A, 08/22/2014). FAMILY HISTORY His family history includes Cancer in his brother and child; Diabetes in his brother, child, mother, and sister; Heart attack in his child; Heart disease in his brother, father, and sister; Hyperlipidemia in his child; Hypertension in his child and mother;  Prostate cancer in his father. SOCIAL HISTORY He  reports that he quit smoking about 34 years ago. He has never used smokeless tobacco. He reports that he does not drink alcohol or use drugs.  MEDICARE WELLNESS OBJECTIVES: Physical activity: Current Exercise Habits: Home exercise routine, Type of exercise: Other - see comments(Exercise bike), Time (Minutes): 30, Frequency (Times/Week): 7, Weekly Exercise (Minutes/Week): 210, Intensity: Mild, Exercise limited by: orthopedic condition(s);neurologic condition(s) Cardiac risk factors: Cardiac Risk Factors include: advanced age (>98men, >32 women);diabetes mellitus;dyslipidemia;hypertension;male gender Depression/mood screen:   Depression screen [facility] 2/9 12/20/2017  Decreased Interest 0  Down, Depressed, Hopeless 0  PHQ - 2 Score 0  Some recent data might be hidden    ADLs:  In your present state of health, do you have any difficulty performing the following activities: 12/20/2017 09/06/2017  Hearing? N N  Vision? N N  Difficulty concentrating or making decisions? Y Y  Comment - patient has mild to moderate meds  Walking or climbing stairs? Y N  Comment Uses walker -  Dressing or bathing? Y N  Comment Aide helps -  Doing errands, shopping? Y Y  Comment Driven by family and aide due to Dementia  Preparing Food and eating ? Y -  Comment Aide helps, can warm up simple things -  Using the Toilet? N -  In the past six months, have you accidently leaked urine? Y -  Comment increased in the past week, wears depends -  Do you have problems with loss of bowel control? N -  Managing your Medications? Y -  Comment Supervised by aide -  Managing your Finances? Y -  Comment Daughter -  Housekeeping or managing your Housekeeping? Y -  Comment Aide helps with housework, yardwork hired out -  Some recent data might be Systems developer  Alert? Yes  Normal Appearance?Yes  Oriented to person? Yes  Place? Yes   Time? Yes  Recall of three  objects?  No  Can perform simple calculations? No  Displays appropriate judgment? No  Can read the correct time from a watch face? No  EOL planning: Does Patient Have a Medical Advance Directive?: Yes Type of Advance Directive: Healthcare Power of Attorney, Living will [place] in Chart?: No - copy requested   Objective:     BP (!) 142/78   Pulse 64   Temp 97.6 F (36.4 C)   Resp 16   Ht 5\' 11"  (1.803 m)   Wt 224 lb (101.6 kg)   SpO2 97%   BMI 31.24 kg/m   General Appearance:  Alert  WD/WN, male , in no apparent distress. Eyes: PERRLA, EOMs, conjunctiva no swelling or erythema, normal fundi and vessels. Sinuses: No frontal/maxillary tenderness ENT/Mouth: EACs patent / TMs  nl. Nares clear without erythema, swelling, mucoid exudates. Oral hygiene is good. No erythema, swelling,  or exudate. Tongue normal, non-obstructing. Tonsils not swollen or erythematous. Hearing decreased Neck: Supple, thyroid normal. No bruits, nodes or JVD. Respiratory: Respiratory effort normal.  BS equal and clear bilateral without rales, rhonci, wheezing or stridor. Cardio: Heart sounds are normal with regular rate and rhythm and no murmurs, rubs or gallops. Peripheral pulses are normal and equal bilaterally without edema. No aortic or femoral bruits. Chest: symmetric with normal excursions and percussion.  Abdomen: Flat, soft, with nl bowel sounds. Nontender, no guarding, rebound, hernias, masses, or organomegaly.  Lymphatics: Non tender without lymphadenopathy.  Musculoskeletal: Full ROM all peripheral extremities, joint stability, 4/5 strength, and slow shuffling wait with walker. Slow sitting to standing.  Skin: Warm and dry without rashes, lesions, cyanosis, clubbing or  ecchymosis.  Neuro: Cranial nerves intact, reflexes equal bilaterally. Normal muscle tone, no cerebellar symptoms. Sensation intact.  Pysch: Awake and oriented X 3 with normal affect, insight and judgment  appropriate.    Medicare Attestation I have personally reviewed: The patient's medical and social history Their use of alcohol, tobacco or illicit drugs Their current medications and supplements The patient's functional ability including ADLs,fall risks, home safety risks, cognitive, and hearing and visual impairment Diet and physical activities Evidence for depression or mood disorders  The patient's weight, height, BMI, and visual acuity have been recorded in the chart.  I have made referrals, counseling, and provided education to the patient based on review of the above and I have provided the patient with a written personalized care plan for preventive services.  Over 40 minutes of exam, counseling, chart review was performed.   Izora Ribas, NP   12/20/2017

## 2017-12-20 ENCOUNTER — Telehealth: Payer: Self-pay

## 2017-12-20 ENCOUNTER — Encounter: Payer: Self-pay | Admitting: Adult Health

## 2017-12-20 ENCOUNTER — Ambulatory Visit: Payer: Medicare Other | Admitting: Adult Health

## 2017-12-20 VITALS — BP 142/78 | HR 64 | Temp 97.6°F | Resp 16 | Ht 71.0 in | Wt 224.0 lb

## 2017-12-20 DIAGNOSIS — Z794 Long term (current) use of insulin: Secondary | ICD-10-CM

## 2017-12-20 DIAGNOSIS — R6889 Other general symptoms and signs: Secondary | ICD-10-CM

## 2017-12-20 DIAGNOSIS — Z0001 Encounter for general adult medical examination with abnormal findings: Secondary | ICD-10-CM

## 2017-12-20 DIAGNOSIS — E08319 Diabetes mellitus due to underlying condition with unspecified diabetic retinopathy without macular edema: Secondary | ICD-10-CM

## 2017-12-20 DIAGNOSIS — N184 Chronic kidney disease, stage 4 (severe): Secondary | ICD-10-CM

## 2017-12-20 DIAGNOSIS — I10 Essential (primary) hypertension: Secondary | ICD-10-CM

## 2017-12-20 DIAGNOSIS — H4010X0 Unspecified open-angle glaucoma, stage unspecified: Secondary | ICD-10-CM

## 2017-12-20 DIAGNOSIS — E559 Vitamin D deficiency, unspecified: Secondary | ICD-10-CM

## 2017-12-20 DIAGNOSIS — Z79899 Other long term (current) drug therapy: Secondary | ICD-10-CM

## 2017-12-20 DIAGNOSIS — E119 Type 2 diabetes mellitus without complications: Secondary | ICD-10-CM

## 2017-12-20 DIAGNOSIS — R001 Bradycardia, unspecified: Secondary | ICD-10-CM

## 2017-12-20 DIAGNOSIS — I5032 Chronic diastolic (congestive) heart failure: Secondary | ICD-10-CM

## 2017-12-20 DIAGNOSIS — R451 Restlessness and agitation: Secondary | ICD-10-CM

## 2017-12-20 DIAGNOSIS — G301 Alzheimer's disease with late onset: Secondary | ICD-10-CM

## 2017-12-20 DIAGNOSIS — Z0000 Encounter for general adult medical examination without abnormal findings: Secondary | ICD-10-CM

## 2017-12-20 DIAGNOSIS — E1122 Type 2 diabetes mellitus with diabetic chronic kidney disease: Secondary | ICD-10-CM

## 2017-12-20 DIAGNOSIS — E782 Mixed hyperlipidemia: Secondary | ICD-10-CM

## 2017-12-20 DIAGNOSIS — C61 Malignant neoplasm of prostate: Secondary | ICD-10-CM

## 2017-12-20 DIAGNOSIS — E118 Type 2 diabetes mellitus with unspecified complications: Secondary | ICD-10-CM

## 2017-12-20 DIAGNOSIS — F0280 Dementia in other diseases classified elsewhere without behavioral disturbance: Secondary | ICD-10-CM

## 2017-12-20 MED ORDER — HALOPERIDOL 1 MG PO TABS: ORAL_TABLET | ORAL | 2 refills | Status: DC

## 2017-12-20 MED ORDER — DOXYCYCLINE HYCLATE 100 MG PO CAPS: ORAL_CAPSULE | ORAL | 0 refills | Status: DC

## 2017-12-20 NOTE — Patient Instructions (Addendum)
Please bring by advanced directive or power of attorney paperwork    I'm sending in doxycycline for possible infection - 1 cap twice daily for 10 days  Haldol 1 mg tabs - start by taking 1/2 tab once or twice daily and see how this does with mood, increase to 1/2 tab three times a day if helping, cut down on dose if very sleepy     Urinary Tract Infection, Adult A urinary tract infection (UTI) is an infection of any part of the urinary tract. The urinary tract includes the:  Kidneys.  Ureters.  Bladder.  Urethra.  These organs make, store, and get rid of pee (urine) in the body. Follow these instructions at home:  Take over-the-counter and prescription medicines only as told by your doctor.  If you were prescribed an antibiotic medicine, take it as told by your doctor. Do not stop taking the antibiotic even if you start to feel better.  Avoid the following drinks: ? Alcohol. ? Caffeine. ? Tea. ? Carbonated drinks.  Drink enough fluid to keep your pee clear or pale yellow.  Keep all follow-up visits as told by your doctor. This is important.  Make sure to: ? Empty your bladder often and completely. Do not to hold pee for long periods of time. ? Empty your bladder before and after sex. ? Wipe from front to back after a bowel movement if you are male. Use each tissue one time when you wipe. Contact a doctor if:  You have back pain.  You have a fever.  You feel sick to your stomach (nauseous).  You throw up (vomit).  Your symptoms do not get better after 3 days.  Your symptoms go away and then come back. Get help right away if:  You have very bad back pain.  You have very bad lower belly (abdominal) pain.  You are throwing up and cannot keep down any medicines or water. This information is not intended to replace advice given to you by your health care provider. Make sure you discuss any questions you have with your health care provider. Document Released:  11/23/2007 Document Revised: 11/12/2015 Document Reviewed: 04/27/2015 Elsevier Interactive Patient Education  [employer].

## 2017-12-20 NOTE — Telephone Encounter (Signed)
Pt caretaker was informed about appt & voiced understanding. Pt's caretaker stated that she would come to her father's next office visit on July 3rd 2019 at 3pm by DD.

## 2017-12-21 LAB — COMPLETE METABOLIC PANEL WITH GFR
AG RATIO: 1.6 (calc) (ref 1.0–2.5)
ALKALINE PHOSPHATASE (APISO): 62 U/L (ref 40–115)
ALT: 15 U/L (ref 9–46)
AST: 19 U/L (ref 10–35)
Albumin: 4.2 g/dL (ref 3.6–5.1)
BILIRUBIN TOTAL: 0.4 mg/dL (ref 0.2–1.2)
BUN/Creatinine Ratio: 13 (calc) (ref 6–22)
BUN: 31 mg/dL — ABNORMAL HIGH (ref 7–25)
CALCIUM: 9.6 mg/dL (ref 8.6–10.3)
CHLORIDE: 98 mmol/L (ref 98–110)
CO2: 25 mmol/L (ref 20–32)
Creat: 2.43 mg/dL — ABNORMAL HIGH (ref 0.70–1.11)
GFR, EST NON AFRICAN AMERICAN: 23 mL/min/{1.73_m2} — AB (ref >=60)
GFR, Est African American: 26 mL/min/{1.73_m2} — ABNORMAL LOW (ref >=60)
Globulin: 2.7 g/dL (calc) (ref 1.9–3.7)
Glucose, Bld: 207 mg/dL — ABNORMAL HIGH (ref 65–99)
POTASSIUM: 4.1 mmol/L (ref 3.5–5.3)
SODIUM: 132 mmol/L — AB (ref 135–146)
Total Protein: 6.9 g/dL (ref 6.1–8.1)

## 2017-12-21 LAB — LIPID PANEL
CHOL/HDL RATIO: 4.3 (calc) (ref <5.0)
Cholesterol: 175 mg/dL (ref <200)
HDL: 41 mg/dL (ref >40)
LDL Cholesterol (Calc): 109 mg/dL (calc) — ABNORMAL HIGH
Non-HDL Cholesterol (Calc): 134 mg/dL (calc) — ABNORMAL HIGH (ref <130)
Triglycerides: 137 mg/dL (ref <150)

## 2017-12-21 LAB — CBC WITH DIFFERENTIAL/PLATELET
BASOS ABS: 61 {cells}/uL (ref 0–200)
Basophils Relative: 1.1 %
EOS ABS: 99 {cells}/uL (ref 15–500)
Eosinophils Relative: 1.8 %
HEMATOCRIT: 34.9 % — AB (ref 38.5–50.0)
Hemoglobin: 11.9 g/dL — ABNORMAL LOW (ref 13.2–17.1)
Lymphs Abs: 1865 cells/uL (ref 850–3900)
MCH: 29.5 pg (ref 27.0–33.0)
MCHC: 34.1 g/dL (ref 32.0–36.0)
MCV: 86.4 fL (ref 80.0–100.0)
MPV: 12.7 fL — ABNORMAL HIGH (ref 7.5–12.5)
Monocytes Relative: 9.7 %
NEUTROS PCT: 53.5 %
Neutro Abs: 2943 cells/uL (ref 1500–7800)
PLATELETS: 207 10*3/uL (ref 140–400)
RBC: 4.04 10*6/uL — ABNORMAL LOW (ref 4.20–5.80)
RDW: 12.3 % (ref 11.0–15.0)
TOTAL LYMPHOCYTE: 33.9 %
WBC mixed population: 534 cells/uL (ref 200–950)
WBC: 5.5 10*3/uL (ref 3.8–10.8)

## 2017-12-21 LAB — MAGNESIUM: Magnesium: 2 mg/dL (ref 1.5–2.5)

## 2017-12-21 LAB — URINALYSIS W MICROSCOPIC + REFLEX CULTURE
BACTERIA UA: NONE SEEN /HPF
Bilirubin Urine: NEGATIVE
GLUCOSE, UA: NEGATIVE
HGB URINE DIPSTICK: NEGATIVE
HYALINE CAST: NONE SEEN /LPF
Ketones, ur: NEGATIVE
Leukocyte Esterase: NEGATIVE
Nitrites, Initial: NEGATIVE
PH: 6 (ref 5.0–8.0)
Protein, ur: NEGATIVE
RBC / HPF: NONE SEEN /HPF (ref 0–2)
SPECIFIC GRAVITY, URINE: 1.009 (ref 1.001–1.03)
Squamous Epithelial / LPF: NONE SEEN /HPF (ref <=5)
WBC UA: NONE SEEN /HPF (ref 0–5)

## 2017-12-21 LAB — NO CULTURE INDICATED

## 2017-12-21 LAB — TSH: TSH: 1.54 mIU/L (ref 0.40–4.50)

## 2017-12-22 ENCOUNTER — Other Ambulatory Visit: Payer: Self-pay | Admitting: Endocrinology

## 2017-12-27 ENCOUNTER — Ambulatory Visit: Payer: Medicare Other | Admitting: Physician Assistant

## 2017-12-27 ENCOUNTER — Encounter: Payer: Self-pay | Admitting: Physician Assistant

## 2017-12-27 ENCOUNTER — Other Ambulatory Visit: Payer: Self-pay | Admitting: Endocrinology

## 2017-12-27 VITALS — BP 184/76 | HR 60 | Ht 71.0 in | Wt 227.0 lb

## 2017-12-27 DIAGNOSIS — Z794 Long term (current) use of insulin: Secondary | ICD-10-CM

## 2017-12-27 DIAGNOSIS — I5032 Chronic diastolic (congestive) heart failure: Secondary | ICD-10-CM | POA: Diagnosis not present

## 2017-12-27 DIAGNOSIS — E119 Type 2 diabetes mellitus without complications: Secondary | ICD-10-CM

## 2017-12-27 DIAGNOSIS — R001 Bradycardia, unspecified: Secondary | ICD-10-CM | POA: Diagnosis not present

## 2017-12-27 DIAGNOSIS — N183 Chronic kidney disease, stage 3 unspecified: Secondary | ICD-10-CM

## 2017-12-27 DIAGNOSIS — I10 Essential (primary) hypertension: Secondary | ICD-10-CM | POA: Diagnosis not present

## 2017-12-27 DIAGNOSIS — IMO0001 Reserved for inherently not codable concepts without codable children: Secondary | ICD-10-CM

## 2017-12-27 DIAGNOSIS — E785 Hyperlipidemia, unspecified: Secondary | ICD-10-CM | POA: Diagnosis not present

## 2017-12-27 MED ORDER — HYDRALAZINE HCL 100 MG PO TABS: 100.0000 mg | ORAL_TABLET | Freq: Three times a day (TID) | ORAL | 3 refills | Status: DC

## 2017-12-27 NOTE — Progress Notes (Addendum)
Cardiology Office Note    Date:  12/30/2017   ID:  Gregory Hatfield, DOB March 02, 1926, MRN [id number]  PCP:  [name], MD  Cardiologist:  Dr. Debara Pickett  Chief Complaint  Patient presents with  . Follow-up    pt denied chest pain. Seen for Dr. Debara Pickett.    History of Present Illness:  Gregory Hatfield is a 84 y.o. male with PMH of HTN, CKD, IDDM, HLD and bradycardia.  He had a cardiac catheterization in 2005 which was normal.  He was hospitalized in May 2018 for profound bradycardia with heart rate down to the 30s.  It was not clear whether he was confused about his carvedilol and diltiazem.  This was held initially, however he was also noted to be hypokalemic as well.  He was given calcium gluconate in the ED, his junctional rhythm later improved to sinus rhythm.  There was acute on chronic renal insufficiency due to result of bradycardia, this later returned to the baseline of 1.9.  On discharge, his heart rate has improved to 50 to 60s.  He was restarted on a lower dose of diltiazem 120 mg daily along with carvedilol 6.25 mg twice daily.  He was last seen by Dr. Debara Pickett in May 2018 at which time he was quite stable.  Diltiazem was discontinued during the office visit and he was started on amlodipine instead.  Patient presents today for cardiology office visit.  He denies any significant chest discomfort or shortness of breath.  He has no lower extremity edema, orthopnea or PND.  His blood pressure is elevated today and has been elevated on previous visits as well.  I will increase his hydralazine to 100 mg 3 times daily.    Past Medical History:  Diagnosis Date  . Bradycardia 10/06/2016  . CHF (congestive heart failure) ([place])   . CKD stage 4 due to type 2 diabetes mellitus ([place]) 06/03/2015  . Dyslipidemia associated with type 2 diabetes mellitus ([place]) 05/05/2015  . Essential hypertension 05/05/2015  . IDDM (insulin dependent diabetes mellitus) ([place])   . SDAT (senile dementia of  Alzheimer's type) 06/16/2013  . Uncontrolled insulin-dependent diabetes mellitus with renal manifestation ([place]) 05/05/2015    Past Surgical History:  Procedure Laterality Date  . CARDIAC CATHETERIZATION  04/2004   normal r-sided pressure, mild pulm HTN  . CARPAL TUNNEL RELEASE Right   . CERVICAL SPINE SURGERY  1990's  . LUMBAR SPINE SURGERY  2003  . PROSTATE SURGERY  10/2010   transurethral resection   . TOTAL HIP ARTHROPLASTY Left 01/17/2014   Procedure: LEFT TOTAL HIP ARTHROPLASTY;  Surgeon: Yvette Rack., MD;  Location: [facility];  Service: Orthopedics;  Laterality: Left;  . TRANSTHORACIC ECHOCARDIOGRAM  05/2006   EF 60-70%; mild calcif of MV; mild MV regurg; LA mildly dilated  . TRANSURETHRAL RESECTION OF PROSTATE N/A 08/22/2014   Procedure: TRANSURETHRAL RESECTION OF THE PROSTATE (TURP);  Surgeon: Marcia Brash, MD;  Location: [facility];  Service: Urology;  Laterality: N/A;    Current Medications: Outpatient Medications Prior to Visit  Medication Sig Dispense Refill  . amLODipine (NORVASC) 5 MG tablet TAKE 1 TABLET ONCE DAILY. 90 tablet 0  . aspirin 81 MG tablet Take 81 mg by mouth daily.    . B-D UF III MINI PEN NEEDLES 31G X 5 MM MISC USE FOR INJECTIONS AS DIRECTED 100 each 0  . carvedilol (COREG) 6.25 MG tablet TAKE 1 TABLET BY MOUTH TWICE DAILY WITH A MEAL. 180 tablet 1  .  Cholecalciferol (VITAMIN D3) 5000 UNITS CAPS Take 5,000 Units by mouth 2 (two) times daily.     Marland Kitchen dextromethorphan-guaiFENesin (MUCINEX DM) 30-600 MG 12hr tablet Take 1 tablet by mouth 2 (two) times daily.    Marland Kitchen doxycycline (VIBRAMYCIN) 100 MG capsule Take 1 capsule twice daily with food 20 capsule 0  . furosemide (LASIX) 40 MG tablet TAKE 1 OR 2 TABLETS ONCE DAILY FOR BLOOD PRESSURE AND ANKLE SWELLING. 180 tablet 0  . LEVEMIR FLEXTOUCH 100 UNIT/ML Pen INJECT 50 UNITS IN THE AM AND 25 UNITS IN THE PM 15 mL 0  . LUMIGAN 0.01 % SOLN Place 1 drop into the right eye at bedtime.     . Multiple Vitamin (MULTIVITAMIN)  tablet Take 1 tablet by mouth daily.      Marland Kitchen MYRBETRIQ 50 MG TB24 tablet Take 50 mg by mouth daily.     . ONE TOUCH ULTRA TEST test strip CHECK BLOOD SUGAR 6 TIMES A DAY. 540 each 3  . Probiotic Product (PROBIOTIC DAILY PO) Take 1 tablet by mouth daily.     Marland Kitchen senna-docusate (SENOKOT-S) 8.6-50 MG tablet Take 1 tablet by mouth at bedtime. (Patient taking differently: Take 1 tablet by mouth as needed. )    . SIMBRINZA 1-0.2 % SUSP Place 1 drop into both eyes 3 (three) times daily.    . haloperidol (HALDOL) 1 MG tablet Take 1/2-1 tab up to three times daily for mood. Cut down on dose if getting sleepy. (Patient not taking: Reported on 12/29/2017) 90 tablet 2  . hydrALAZINE (APRESOLINE) 50 MG tablet Take 1 tablet (50 mg total) by mouth 3 (three) times daily. KEEP OV. 270 tablet 0  . hyoscyamine (LEVSIN/SL) 0.125 MG SL tablet Place 1 tablet (0.125 mg total) under the tongue every 4 (four) hours as needed. 30 tablet 0   No facility-administered medications prior to visit.      Allergies:   Ace inhibitors; Demerol; Cosopt [dorzolamide hcl-timolol mal]; Flomax [tamsulosin hcl]; Morphine and related; and Oxytrol [oxybutynin]   Social History   Socioeconomic History  . Marital status: Widowed    Spouse name: Not on file  . Number of children: 5  . Years of education: master's  . Highest education level: Not on file  Occupational History  . Occupation: Therapist, music: RETIRED  Social Needs  . Financial resource strain: Not on file  . Food insecurity:    Worry: Not on file    Inability: Not on file  . Transportation needs:    Medical: Not on file    Non-medical: Not on file  Tobacco Use  . Smoking status: Former Smoker    Last attempt to quit: 06/21/1983    Years since quitting: 34.5  . Smokeless tobacco: Never Used  Substance and Sexual Activity  . Alcohol use: No  . Drug use: No  . Sexual activity: Never  Lifestyle  . Physical activity:    Days per week: Not on file     Minutes per session: Not on file  . Stress: Not on file  Relationships  . Social connections:    Talks on phone: Not on file    Gets together: Not on file    Attends religious service: Not on file    Active member of club or organization: Not on file    Attends meetings of clubs or organizations: Not on file    Relationship status: Not on file  Other Topics Concern  . Not on file  Social History Narrative  . Not on file     Family History:  The patient's family history includes Cancer in his brother and child; Diabetes in his brother, child, mother, and sister; Heart attack in his child; Heart disease in his brother, father, and sister; Hyperlipidemia in his child; Hypertension in his child and mother; Prostate cancer in his father.   ROS:   Please see the history of present illness.    ROS All other systems reviewed and are negative.   PHYSICAL EXAM:   VS:  BP (!) 184/76   Pulse 60   Ht 5\' 11"  (1.803 m)   Wt 227 lb (103 kg)   BMI 31.66 kg/m    GEN: Well nourished, well developed, in no acute distress  HEENT: normal  Neck: no JVD, carotid bruits, or masses Cardiac: RRR; no murmurs, rubs, or gallops,no edema  Respiratory:  clear to auscultation bilaterally, normal work of breathing GI: soft, nontender, nondistended, + BS MS: no deformity or atrophy  Skin: warm and dry, no rash Neuro:  Alert and Oriented x 3, Strength and sensation are intact Psych: euthymic mood, full affect  Wt Readings from Last 3 Encounters:  12/29/17 224 lb (101.6 kg)  12/27/17 227 lb (103 kg)  12/20/17 224 lb (101.6 kg)      Studies/Labs Reviewed:   EKG:  EKG is ordered today.  The ekg ordered today demonstrates normal sinus rhythm, left bundle branch block.  Recent Labs: 12/20/2017: ALT 15; Magnesium 2.0; TSH 1.54 12/29/2017: BUN 28; Creat 1.85; Hemoglobin 12.2; Platelets 190; Potassium 3.8; Sodium 134   Lipid Panel    Component Value Date/Time   CHOL 175 12/20/2017 1208   TRIG 137  12/20/2017 1208   HDL 41 12/20/2017 1208   CHOLHDL 4.3 12/20/2017 1208   VLDL 33 (H) 10/19/2016 1557   LDLCALC 109 (H) 12/20/2017 1208    Additional studies/ records that were reviewed today include:    Echo 05/06/2015 LV EF: 55% -   60% Study Conclusions  - Left ventricle: The cavity size was normal. Wall thickness was   increased in a pattern of moderate LVH. Systolic function was   normal. The estimated ejection fraction was in the range of 55%   to 60%. Wall motion was normal; there were no regional wall   motion abnormalities.  ASSESSMENT:    1. Chronic diastolic CHF (congestive heart failure) ([place])   2. CKD (chronic kidney disease), stage III ([place])   3. Essential hypertension   4. Bradycardia   5. Hyperlipidemia, unspecified hyperlipidemia type   6. IDDM (insulin dependent diabetes mellitus) ([place])      PLAN:  In order of problems listed above:  1. Chronic diastolic heart failure: Euvolemic on physical exam.  No sign of heart failure symptom.  2. Hypertension: Blood pressure elevated, increase hydralazine to 100 mg 3 times daily.  3. CKD stage III: Renal function stable  4. Bradycardia: History of bradycardia in 2018, no recurrence after medication adjustment  5. Hyperlipidemia: He does not appears to be on statin, told on diet and exercise.  Recent lab work obtained on 12/19/2017 showed a total cholesterol 173, triglyceride 137, HDL 41.  6. DM2: On insulin, managed by primary care provider    Medication Adjustments/Labs and Tests Ordered: Current medicines are reviewed at length with the patient today.  Concerns regarding medicines are outlined above.  Medication changes, Labs and Tests ordered today are listed in the Patient Instructions below. Patient Instructions  Medication Instructions:  INCREASE Hyrdalazine to 100mg  Take 1 tablet three times a day   Labwork: None   Testing/Procedures: None   Follow-Up: Your physician recommends that you schedule  a follow-up appointment in: 3 months with Dr Debara Pickett ONLY.  Any Other Special Instructions Will Be Listed Below (If Applicable). If you need a refill on your cardiac medications before your next appointment, please call your pharmacy.     Gregory Hatfield, [place]  12/30/2017 12:05 AM    [address], [place], [place]  [address] Phone: [phone]; Fax: [phone]

## 2017-12-27 NOTE — Patient Instructions (Addendum)
Medication Instructions:  INCREASE Hyrdalazine to 100mg  Take 1 tablet three times a day   Labwork: None   Testing/Procedures: None   Follow-Up: Your physician recommends that you schedule a follow-up appointment in: 3 months with Dr Debara Pickett ONLY.  Any Other Special Instructions Will Be Listed Below (If Applicable). If you need a refill on your cardiac medications before your next appointment, please call your pharmacy.

## 2017-12-29 ENCOUNTER — Ambulatory Visit: Payer: Medicare Other | Admitting: Adult Health

## 2017-12-29 ENCOUNTER — Encounter: Payer: Self-pay | Admitting: Adult Health

## 2017-12-29 ENCOUNTER — Encounter: Payer: Self-pay | Admitting: Physician Assistant

## 2017-12-29 VITALS — BP 144/72 | HR 55 | Temp 97.9°F | Ht 71.0 in | Wt 224.0 lb

## 2017-12-29 DIAGNOSIS — F0280 Dementia in other diseases classified elsewhere without behavioral disturbance: Secondary | ICD-10-CM | POA: Diagnosis not present

## 2017-12-29 DIAGNOSIS — R451 Restlessness and agitation: Secondary | ICD-10-CM | POA: Diagnosis not present

## 2017-12-29 DIAGNOSIS — G301 Alzheimer's disease with late onset: Secondary | ICD-10-CM

## 2017-12-29 LAB — BASIC METABOLIC PANEL WITH GFR
BUN/Creatinine Ratio: 15 (calc) (ref 6–22)
BUN: 28 mg/dL — AB (ref 7–25)
CHLORIDE: 101 mmol/L (ref 98–110)
CO2: 25 mmol/L (ref 20–32)
CREATININE: 1.85 mg/dL — AB (ref 0.70–1.11)
Calcium: 9.4 mg/dL (ref 8.6–10.3)
GFR, Est African American: 36 mL/min/{1.73_m2} — ABNORMAL LOW (ref >=60)
GFR, Est Non African American: 31 mL/min/{1.73_m2} — ABNORMAL LOW (ref >=60)
Glucose, Bld: 194 mg/dL — ABNORMAL HIGH (ref 65–99)
Potassium: 3.8 mmol/L (ref 3.5–5.3)
SODIUM: 134 mmol/L — AB (ref 135–146)

## 2017-12-29 LAB — CBC WITH DIFFERENTIAL/PLATELET
BASOS PCT: 0.9 %
Basophils Absolute: 58 cells/uL (ref 0–200)
EOS PCT: 1.9 %
Eosinophils Absolute: 122 cells/uL (ref 15–500)
HCT: 36.9 % — ABNORMAL LOW (ref 38.5–50.0)
HEMOGLOBIN: 12.2 g/dL — AB (ref 13.2–17.1)
Lymphs Abs: 1965 cells/uL (ref 850–3900)
MCH: 28.7 pg (ref 27.0–33.0)
MCHC: 33.1 g/dL (ref 32.0–36.0)
MCV: 86.8 fL (ref 80.0–100.0)
MONOS PCT: 9.9 %
MPV: 12.2 fL (ref 7.5–12.5)
Neutro Abs: 3622 cells/uL (ref 1500–7800)
Neutrophils Relative %: 56.6 %
Platelets: 190 10*3/uL (ref 140–400)
RBC: 4.25 10*6/uL (ref 4.20–5.80)
RDW: 12.6 % (ref 11.0–15.0)
Total Lymphocyte: 30.7 %
WBC mixed population: 634 cells/uL (ref 200–950)
WBC: 6.4 10*3/uL (ref 3.8–10.8)

## 2017-12-29 NOTE — Patient Instructions (Signed)
Dementia Caregiver Guide Dementia is a term used to describe a number of symptoms that affect memory and thinking. The most common symptoms include:  Memory loss.  Trouble with language and communication.  Trouble concentrating.  Poor judgment.  Problems with reasoning.  Child-like behavior and language.  Extreme anxiety.  Angry outbursts.  Wandering from home or public places.  Dementia usually gets worse slowly over time. In the early stages, people with dementia can stay independent and safe with some help. In later stages, they need help with daily tasks such as dressing, grooming, and using the bathroom. How to help the person with dementia cope Dementia can be frightening and confusing. Here are some tips to help the person with dementia cope with changes caused by the disease. General tips  Keep the person on track with his or her routine.  Try to identify areas where the person may need help.  Be supportive, patient, calm, and encouraging.  Gently remind the person that adjusting to changes takes time.  Help with the tasks that the person has asked for help with.  Keep the person involved in daily tasks and decisions as much as possible.  Encourage conversation, but try not to get frustrated or harried if the person struggles to find words or does not seem to appreciate your help. Communication tips  When the person is talking or seems frustrated, make eye contact and hold the person's hand.  Ask specific questions that need yes or no answers.  Use simple words, short sentences, and a calm voice. Only give one direction at a time.  When offering choices, limit them to just 1 or 2.  Avoid correcting the person in a negative way.  If the person is struggling to find the right words, gently try to help him or her. How to recognize symptoms of stress Symptoms of stress in caregivers include:  Feeling frustrated or angry with the person with  dementia.  Denying that the person has dementia or that his or her symptoms will not improve.  Feeling hopeless and unappreciated.  Difficulty sleeping.  Difficulty concentrating.  Feeling anxious, irritable, or depressed.  Developing stress-related health problems.  Feeling like you have too little time for your own life.  Follow these instructions at home:  Make sure that you and the person you are caring for: ? Get regular sleep. ? Exercise regularly. ? Eat regular, nutritious meals. ? Drink enough fluid to keep your urine clear or pale yellow. ? Take over-the-counter and prescription medicines only as told by your health care providers. ? Attend all scheduled health care appointments.  Join a support group with others who are caregivers.  Ask about respite care resources so that you can have a regular break from the stress of caregiving.  Look for signs of stress in yourself and in the person you are caring for. If you notice signs of stress, take steps to manage it.  Consider any safety risks and take steps to avoid them.  Organize medications in a pill box for each day of the week.  Create a plan to handle any legal or financial matters. Get legal or financial advice if needed.  Keep a calendar in a central location to remind the person of appointments or other activities. Tips for reducing the risk of injury  Keep floors clear of clutter. Remove rugs, magazine racks, and floor lamps.  Keep hallways well lit, especially at night.  Put a handrail and nonslip mat in the bathtub  or shower.  Put childproof locks on cabinets that contain dangerous items, such as medicines, alcohol, guns, toxic cleaning items, sharp tools or utensils, matches, and lighters.  Put the locks in places where the person cannot see or reach them easily. This will help ensure that the person does not wander out of the house and get lost.  Be prepared for emergencies. Keep a list of  emergency phone numbers and addresses in a convenient area.  Remove car keys and lock garage doors so that the person does not try to get in the car and drive.  Have the person wear a bracelet that tracks locations and identifies the person as having memory problems. This should be worn at all times for safety. Where to find support: Many individuals and organizations offer support. These include:  Support groups for people with dementia and for caregivers.  Counselors or therapists.  Home health care services.  Adult day care centers.  Where to find more information: Alzheimer's Association: [employer] Contact a health care provider if:  The person's health is rapidly getting worse.  You are no longer able to care for the person.  Caring for the person is affecting your physical and emotional health.  The person threatens himself or herself, you, or anyone else. Summary  Dementia is a term used to describe a number of symptoms that affect memory and thinking.  Dementia usually gets worse slowly over time.  Take steps to reduce the person's risk of injury, and to plan for future care.  Caregivers need support, relief from caregiving, and time for their own lives. This information is not intended to replace advice given to you by your health care provider. Make sure you discuss any questions you have with your health care provider. Document Released: 05/10/2016 Document Revised: 05/10/2016 Document Reviewed: 05/10/2016 Elsevier Interactive Patient Education  [employer].

## 2017-12-29 NOTE — Progress Notes (Signed)
Assessment and Plan:  SDAT (senile dementia of Alzheimer's type)/ Agitation Significantly improved with resumption of established routine, implementation of deescalating techniques. We reviewed medications at length today and will discontinue levsin, and encouraged to follow up with urology regarding discontinuing myrbetriq as benefit is lacking in fully incontinent patient Will recheck CBC, BMP/GFR today to follow up on hyponatremia Follow up as scheduled or sooner as needed  Further disposition pending results of labs. Discussed med's effects and SE's.   Over 30 minutes of exam, counseling, chart review, and critical decision making was performed.   Future Appointments  Date Time Provider [facility]  01/09/2018 10:00 AM Gregory Shin, MD [facility] None  01/16/2018 10:45 AM Gregory Hatfield, DPM [facility]  03/22/2018 11:00 AM Gregory Pinto, MD [facility] None    ------------------------------------------------------------------------------------------------------------------   HPI BP (!) 144/72   Pulse (!) 55   Temp 97.9 F (36.6 C)   Ht 5\' 11"  (1.803 m)   Wt 224 lb (101.6 kg)   SpO2 96%   BMI 31.24 kg/m   84 y.o.male with alzheimer's dementia presents for follow up accompanied by his daughter (power of attorney) after concerns about increased agitation at the last visit. He was accompanied a hired caregiver last time who was unable to provide in-depth detail due to her lack of familiarity with medication regimen and complete routine. Medical workup at that time was grossly unremarkable, other than mild hyponatremia (132 on 7/3), and he was treated with doxycycline. Low dose haldol was discussed with caregiver and provided after discussion of risks, but was never initiated after conversation via phone with daughter.    Today his daughter provides much more detail, and reports that they have worked with a Education officer, community and have been focusing on routine  and interactions to deescalate agitation and are doing well. It appears the original increased agitation may primarily be attributed to a significant change in routine due to caregiver illness/unavailability. We discussed regimens/routines and other non-pharmacological management techniques related to dementia at length today. Medications were reviewed, and levsin will be discontinued, and will have her discuss benefit of myrbetriq at this point with urology as I anticipate benefit is limited in fully incontinent patient on diuretic.   Past Medical History:  Diagnosis Date  . Bradycardia 10/06/2016  . CHF (congestive heart failure) ([place])   . CKD stage 4 due to type 2 diabetes mellitus ([place]) 06/03/2015  . Dyslipidemia associated with type 2 diabetes mellitus ([place]) 05/05/2015  . Essential hypertension 05/05/2015  . IDDM (insulin dependent diabetes mellitus) ([place])   . SDAT (senile dementia of Alzheimer's type) 06/16/2013  . Uncontrolled insulin-dependent diabetes mellitus with renal manifestation ([place]) 05/05/2015     Allergies  Allergen Reactions  . Ace Inhibitors Other (See Comments) and Cough    Chronic cough  . Demerol Other (See Comments)    Hard to wake up  . Cosopt [Dorzolamide Hcl-Timolol Mal] Other (See Comments)    Cause heart rate to drop  . Flomax [Tamsulosin Hcl] Other (See Comments)    Low BP  . Morphine And Related Other (See Comments)    hallucinations  . Oxytrol [Oxybutynin] Rash    Current Outpatient Medications on File Prior to Visit  Medication Sig  . amLODipine (NORVASC) 5 MG tablet TAKE 1 TABLET ONCE DAILY.  Marland Kitchen aspirin 81 MG tablet Take 81 mg by mouth daily.  . B-D UF III MINI PEN NEEDLES 31G X 5 MM MISC USE FOR INJECTIONS AS DIRECTED  . carvedilol (  COREG) 6.25 MG tablet TAKE 1 TABLET BY MOUTH TWICE DAILY WITH A MEAL.  Marland Kitchen Cholecalciferol (VITAMIN D3) 5000 UNITS CAPS Take 5,000 Units by mouth 2 (two) times daily.   Marland Kitchen dextromethorphan-guaiFENesin (MUCINEX DM) 30-600  MG 12hr tablet Take 1 tablet by mouth 2 (two) times daily.  Marland Kitchen doxycycline (VIBRAMYCIN) 100 MG capsule Take 1 capsule twice daily with food  . furosemide (LASIX) 40 MG tablet TAKE 1 OR 2 TABLETS ONCE DAILY FOR BLOOD PRESSURE AND ANKLE SWELLING.  . hydrALAZINE (APRESOLINE) 100 MG tablet Take 1 tablet (100 mg total) by mouth 3 (three) times daily. KEEP OV.  Marland Kitchen LEVEMIR FLEXTOUCH 100 UNIT/ML Pen INJECT 50 UNITS IN THE AM AND 25 UNITS IN THE PM  . LUMIGAN 0.01 % SOLN Place 1 drop into the right eye at bedtime.   . Multiple Vitamin (MULTIVITAMIN) tablet Take 1 tablet by mouth daily.    Marland Kitchen MYRBETRIQ 50 MG TB24 tablet Take 50 mg by mouth daily.   . ONE TOUCH ULTRA TEST test strip CHECK BLOOD SUGAR 6 TIMES A DAY.  . Probiotic Product (PROBIOTIC DAILY PO) Take 1 tablet by mouth daily.   Marland Kitchen senna-docusate (SENOKOT-S) 8.6-50 MG tablet Take 1 tablet by mouth at bedtime. (Patient taking differently: Take 1 tablet by mouth as needed. )  . SIMBRINZA 1-0.2 % SUSP Place 1 drop into both eyes 3 (three) times daily.  . haloperidol (HALDOL) 1 MG tablet Take 1/2-1 tab up to three times daily for mood. Cut down on dose if getting sleepy. (Patient not taking: Reported on 12/29/2017)   No current facility-administered medications on file prior to visit.     ROS: all negative except above.   Physical Exam:  BP (!) 144/72   Pulse (!) 55   Temp 97.9 F (36.6 C)   Ht 5\' 11"  (1.803 m)   Wt 224 lb (101.6 kg)   SpO2 96%   BMI 31.24 kg/m   General Appearance: Well nourished, in no apparent distress. Eyes: PERRLA, EOMs, conjunctiva no swelling or erythema Sinuses: No Frontal/maxillary tenderness ENT/Mouth: Ext aud canals clear, TMs without erythema, bulging. No erythema, swelling, or exudate on post pharynx.  Tonsils not swollen or erythematous. Somewhat hard of hearing Neck: Supple, thyroid normal.  Respiratory: Respiratory effort normal, BS equal bilaterally without rales, rhonchi, wheezing or stridor.  Cardio: RRR  with no MRGs. Bilateral edema of ankles and feet.  Abdomen: Soft, + BS.  Non tender, no guarding. Lymphatics: Non tender without lymphadenopathy.  Musculoskeletal: Symmetrical strength, slow gait with walker.   Skin: Warm, dry without rashes, lesions, ecchymosis.   Psych: Awake and oriented X 3, normal affect, Insight and Judgment poor.   Gregory Ribas, NP 12:13 PM [facility]

## 2018-01-01 ENCOUNTER — Telehealth: Payer: Self-pay

## 2018-01-01 NOTE — Telephone Encounter (Signed)
Nadine notified

## 2018-01-01 NOTE — Telephone Encounter (Signed)
Requesting what BP baseline perimeters need to be, when to report to doctor and when to call 911. Please advise.

## 2018-01-02 ENCOUNTER — Telehealth: Payer: Self-pay | Admitting: Internal Medicine

## 2018-01-02 NOTE — Telephone Encounter (Signed)
Left message to call back  

## 2018-01-02 NOTE — Telephone Encounter (Signed)
New Message     Nadine registered nurse is calling on behalf of patient. She is with [facility]. She is calling to obtain the BP parameters for the patient. She says the NP with the PCP office is saying 150/90 should be okay but she wants to confirm with cardiology. Please call to discuss.

## 2018-01-03 NOTE — Telephone Encounter (Signed)
Left message for nadine, his bp goal would be >130/85.

## 2018-01-09 ENCOUNTER — Ambulatory Visit: Payer: Medicare Other | Admitting: Endocrinology

## 2018-01-09 ENCOUNTER — Encounter: Payer: Self-pay | Admitting: Endocrinology

## 2018-01-09 VITALS — BP 170/62 | HR 67 | Wt 228.6 lb

## 2018-01-09 DIAGNOSIS — Z794 Long term (current) use of insulin: Secondary | ICD-10-CM

## 2018-01-09 DIAGNOSIS — E119 Type 2 diabetes mellitus without complications: Secondary | ICD-10-CM

## 2018-01-09 MED ORDER — INSULIN DETEMIR 100 UNIT/ML FLEXPEN: PEN_INJECTOR | SUBCUTANEOUS | 0 refills | Status: DC

## 2018-01-09 NOTE — Patient Instructions (Addendum)
Your blood pressure is high today.  Please see your primary care provider soon, to have it rechecked check your blood sugar 6 times a day--before the 3 meals, and at bedtime.  also check if you have symptoms of your blood sugar being too high or too low.  please keep a record of the readings and bring it to your next appointment here.  please call us sooner if you are having low blood sugar episodes.   On this type of insulin schedule, you should eat meals on a regular schedule.  If a meal is missed or significantly delayed, your blood sugar could go low.  Please reduce the evening insulin to 20 units.   Please come back for a follow-up appointment in 3 months.

## 2018-01-09 NOTE — Progress Notes (Signed)
Subjective:    Patient ID: Gregory Hatfield, male    DOB: 05-20-1926, 84 y.o.   MRN: [id number]  HPI Pt returns for f/u of diabetes mellitus: DM type: Insulin-requiring type 2 Dx'ed: 9935 Complications: renal insuff, retinopathy, CAD, and CHF.   Therapy: insulin since 2003 DKA: never Severe hypoglycemia: never.   Pancreatitis: never. Other: he is on a BID insulin regimen, due to noncompliance with multiple daily injections.   Interval history: He brings a record of his cbg's which I have reviewed today.  It varies from 55-346.  There is no trend throughout the day.  pt states he feels well in general.  Past Medical History:  Diagnosis Date  . Bradycardia 10/06/2016  . CHF (congestive heart failure) ([place])   . CKD stage 4 due to type 2 diabetes mellitus ([place]) 06/03/2015  . Dyslipidemia associated with type 2 diabetes mellitus ([place]) 05/05/2015  . Essential hypertension 05/05/2015  . IDDM (insulin dependent diabetes mellitus) ([place])   . SDAT (senile dementia of Alzheimer's type) 06/16/2013  . Uncontrolled insulin-dependent diabetes mellitus with renal manifestation ([place]) 05/05/2015    Past Surgical History:  Procedure Laterality Date  . CARDIAC CATHETERIZATION  04/2004   normal r-sided pressure, mild pulm HTN  . CARPAL TUNNEL RELEASE Right   . CERVICAL SPINE SURGERY  1990's  . LUMBAR SPINE SURGERY  2003  . PROSTATE SURGERY  10/2010   transurethral resection   . TOTAL HIP ARTHROPLASTY Left 01/17/2014   Procedure: LEFT TOTAL HIP ARTHROPLASTY;  Surgeon: Yvette Rack., MD;  Location: [facility];  Service: Orthopedics;  Laterality: Left;  . TRANSTHORACIC ECHOCARDIOGRAM  05/2006   EF 60-70%; mild calcif of MV; mild MV regurg; LA mildly dilated  . TRANSURETHRAL RESECTION OF PROSTATE N/A 08/22/2014   Procedure: TRANSURETHRAL RESECTION OF THE PROSTATE (TURP);  Surgeon: Marcia Brash, MD;  Location: [facility];  Service: Urology;  Laterality: N/A;    Social History   Socioeconomic History    . Marital status: Widowed    Spouse name: Not on file  . Number of children: 5  . Years of education: master's  . Highest education level: Not on file  Occupational History  . Occupation: Therapist, music: RETIRED  Social Needs  . Financial resource strain: Not on file  . Food insecurity:    Worry: Not on file    Inability: Not on file  . Transportation needs:    Medical: Not on file    Non-medical: Not on file  Tobacco Use  . Smoking status: Former Smoker    Last attempt to quit: 06/21/1983    Years since quitting: 34.5  . Smokeless tobacco: Never Used  Substance and Sexual Activity  . Alcohol use: No  . Drug use: No  . Sexual activity: Never  Lifestyle  . Physical activity:    Days per week: Not on file    Minutes per session: Not on file  . Stress: Not on file  Relationships  . Social connections:    Talks on phone: Not on file    Gets together: Not on file    Attends religious service: Not on file    Active member of club or organization: Not on file    Attends meetings of clubs or organizations: Not on file    Relationship status: Not on file  . Intimate partner violence:    Fear of current or ex partner: Not on file    Emotionally abused: Not on file  Physically abused: Not on file    Forced sexual activity: Not on file  Other Topics Concern  . Not on file  Social History Narrative  . Not on file    Current Outpatient Medications on File Prior to Visit  Medication Sig Dispense Refill  . amLODipine (NORVASC) 5 MG tablet TAKE 1 TABLET ONCE DAILY. 90 tablet 0  . aspirin 81 MG tablet Take 81 mg by mouth daily.    . B-D UF III MINI PEN NEEDLES 31G X 5 MM MISC USE FOR INJECTIONS AS DIRECTED 100 each 0  . carvedilol (COREG) 6.25 MG tablet TAKE 1 TABLET BY MOUTH TWICE DAILY WITH A MEAL. 180 tablet 1  . Cholecalciferol (VITAMIN D3) 5000 UNITS CAPS Take 5,000 Units by mouth 2 (two) times daily.     Marland Kitchen dextromethorphan-guaiFENesin (MUCINEX DM) 30-600  MG 12hr tablet Take 1 tablet by mouth 2 (two) times daily.    . furosemide (LASIX) 40 MG tablet TAKE 1 OR 2 TABLETS ONCE DAILY FOR BLOOD PRESSURE AND ANKLE SWELLING. 180 tablet 0  . hydrALAZINE (APRESOLINE) 100 MG tablet Take 1 tablet (100 mg total) by mouth 3 (three) times daily. KEEP OV. 90 tablet 3  . LUMIGAN 0.01 % SOLN Place 1 drop into the right eye at bedtime.     . Multiple Vitamin (MULTIVITAMIN) tablet Take 1 tablet by mouth daily.      Marland Kitchen MYRBETRIQ 50 MG TB24 tablet Take 50 mg by mouth daily.     . ONE TOUCH ULTRA TEST test strip CHECK BLOOD SUGAR 6 TIMES A DAY. 540 each 3  . Probiotic Product (PROBIOTIC DAILY PO) Take 1 tablet by mouth daily.     Marland Kitchen senna-docusate (SENOKOT-S) 8.6-50 MG tablet Take 1 tablet by mouth at bedtime. (Patient taking differently: Take 1 tablet by mouth as needed. )    . SIMBRINZA 1-0.2 % SUSP Place 1 drop into both eyes 3 (three) times daily.     No current facility-administered medications on file prior to visit.     Allergies  Allergen Reactions  . Ace Inhibitors Other (See Comments) and Cough    Chronic cough  . Demerol Other (See Comments)    Hard to wake up  . Cosopt [Dorzolamide Hcl-Timolol Mal] Other (See Comments)    Cause heart rate to drop  . Flomax [Tamsulosin Hcl] Other (See Comments)    Low BP  . Morphine And Related Other (See Comments)    hallucinations  . Oxytrol [Oxybutynin] Rash    Family History  Problem Relation Age of Onset  . Diabetes Mother   . Hypertension Mother   . Prostate cancer Father   . Heart disease Father   . Diabetes Brother   . Cancer Brother   . Heart disease Brother   . Diabetes Sister   . Heart disease Sister   . Hyperlipidemia Child   . Hypertension Child   . Diabetes Child   . Heart attack Child   . Cancer Child     BP (!) 170/62 (BP Location: Left Arm, Patient Position: Sitting, Cuff Size: Normal)   Pulse 67   Wt 228 lb 9.6 oz (103.7 kg)   SpO2 97%   BMI 31.88 kg/m    Review of  Systems Denies LOC    Objective:   Physical Exam VITAL SIGNS:  See vs page.  GENERAL: no distress.  Pulses: foot pulses are intact bilaterally.   MSK: no deformity of the feet or ankles.  CV: 2+ bilat  edema of the legs Skin:  no ulcer on the feet or ankles.  normal temp on the feet and ankles. There is severe hyperpigmentation and scaling on the legs and feet.   Neuro: sensation is intact to touch on the feet and ankles.  Ext: There is bilateral onychomycosis of the toenails.   Lab Results  Component Value Date   HGBA1C 8.2 (A) 12/12/2017       Assessment & Plan:  Insulin-requiring type 2 DM, with renal insuff.  Therapy limited by variable cbg's. Hypoglycemia: this limits glycemic control.  Frail elderly state: he is not a candidate for aggressive glycemic control.  HTN: is noted today.   Patient Instructions  Your blood pressure is high today.  Please see your primary care provider soon, to have it rechecked check your blood sugar 6 times a day--before the 3 meals, and at bedtime.  also check if you have symptoms of your blood sugar being too high or too low.  please keep a record of the readings and bring it to your next appointment here.  please call us sooner if you are having low blood sugar episodes.   On this type of insulin schedule, you should eat meals on a regular schedule.  If a meal is missed or significantly delayed, your blood sugar could go low.  Please reduce the evening insulin to 20 units.   Please come back for a follow-up appointment in 3 months.

## 2018-01-13 ENCOUNTER — Other Ambulatory Visit: Payer: Self-pay | Admitting: Endocrinology

## 2018-01-15 NOTE — Progress Notes (Signed)
Assessment and Plan:  Gregory Hatfield was seen today for acute visit, edema, shortness of breath and wheezing.  Diagnoses and all orders for this visit:  Heart failure, diastolic, acute on chronic (HCC) Increase lasix to 60 mg BID, called today to schedule close follow up with cardiology within the next 2 days (scheduled/confirmed for 8/1 at 11AM), strict ER precautions given in the interim should he become more short of breath, may still require admission and IV diuretics with close management in light of advanced renal disease/multiple comorbidities, advnaced dementia limiting judgement and symptom recognition on part of the patient  -     Brain natriuretic peptide  Venous stasis ulcer of other part of left lower leg limited to breakdown of skin without varicose veins (HCC) No signs of infection at this time, discussed close monitoring with caregiver, increase lasix and elevate extremities to manage edema in the interim, hasn't tolerated compression hose, will do close follow up next week while pending referral to wound clinic for unna boot application -     Ambulatory referral to Wound Clinic  CKD stage 4 due to type 2 diabetes mellitus ([place]) -     COMPLETE METABOLIC PANEL WITH GFR  Shortness of breath -     Brain natriuretic peptide  Peripheral edema -     CBC with Differential/Platelet -     Brain natriuretic peptide -     Urinalysis, Routine w reflex microscopic -     COMPLETE METABOLIC PANEL WITH GFR  Further disposition pending results of labs. Discussed med's effects and SE's.   Over 30 minutes of exam, counseling, chart review, and critical decision making was performed.   Future Appointments  Date Time Provider [place]  01/18/2018 11:00 AM Almyra Deforest, [place] [facility]  01/23/2018  4:15 PM [place], [place] T, [place] [facility]  01/25/2018  2:15 PM Liane Comber, NP [facility] None  03/22/2018 11:00 AM [name], MD [facility] None  04/13/2018 11:15 AM Renato Shin, MD [facility] None    ------------------------------------------------------------------------------------------------------------------   HPI  BP (!) 158/60   Pulse 78   Temp 97.7 F (36.5 C)   Ht 5\' 11"  (1.803 m)   Wt 240 lb 9.6 oz (109.1 kg)   SpO2 90%   BMI 33.56 kg/m   84 y.o.male with SDAT, T2DM, CHF (ECHO 2016 shows moderate LVH, LV EF 55-60%) and CKD IV presents accompanied by his sitter/caregiver (daughter is primary caregiver/manages meds) for evaluation of leg edema, dyspnea, cough x 3 days. He is followed by cardiology Dr. Debara Pickett, recently saw his PA Almyra Deforest on 12/27/2017, increased dose of hydralazine to 100 mg TID, continues with amlodipine 5 mg daily and carvediolol 6.25 mg daily unchanged from previous. He is prescribed lasix 40 mg to take 1-2 tabs daily, typically takes 40 mg in AM, has been taking bid for last 3 days without improvement in edema or other symptoms. His extremities have severe pitting edema with blisters and weeping, with ulcerative wound to left shin approx 3cm x 4 cm area limited to superficial breakdown.   He has a history of Diastolic CHF, denies paroxysmal nocturnal dyspnea. Positive for dyspnea, fatigue, orthopnea (using 3 pillows) and edema with 12 lb weight gain in the past week, 16 lb since 7/12.  Wt Readings from Last 3 Encounters:  01/16/18 240 lb 9.6 oz (109.1 kg)  01/09/18 228 lb 9.6 oz (103.7 kg)  12/29/17 224 lb (101.6 kg)    Past Medical History:  Diagnosis Date  .  Bradycardia 10/06/2016  . CHF (congestive heart failure) ([place])   . CKD stage 4 due to type 2 diabetes mellitus ([place]) 06/03/2015  . Dyslipidemia associated with type 2 diabetes mellitus ([place]) 05/05/2015  . Essential hypertension 05/05/2015  . IDDM (insulin dependent diabetes mellitus) ([place])   . SDAT (senile dementia of Alzheimer's type) 06/16/2013  . Uncontrolled insulin-dependent diabetes mellitus with renal manifestation ([place]) 05/05/2015     Allergies  Allergen  Reactions  . Ace Inhibitors Other (See Comments) and Cough    Chronic cough  . Demerol Other (See Comments)    Hard to wake up  . Cosopt [Dorzolamide Hcl-Timolol Mal] Other (See Comments)    Cause heart rate to drop  . Flomax [Tamsulosin Hcl] Other (See Comments)    Low BP  . Morphine And Related Other (See Comments)    hallucinations  . Oxytrol [Oxybutynin] Rash    Current Outpatient Medications on File Prior to Visit  Medication Sig  . amLODipine (NORVASC) 5 MG tablet TAKE 1 TABLET ONCE DAILY.  Marland Kitchen aspirin 81 MG tablet Take 81 mg by mouth daily.  . B-D UF III MINI PEN NEEDLES 31G X 5 MM MISC USE FOR INJECTIONS AS DIRECTED  . carvedilol (COREG) 6.25 MG tablet TAKE 1 TABLET BY MOUTH TWICE DAILY WITH A MEAL.  Marland Kitchen Cholecalciferol (VITAMIN D3) 5000 UNITS CAPS Take 5,000 Units by mouth 2 (two) times daily.   Marland Kitchen dextromethorphan-guaiFENesin (MUCINEX DM) 30-600 MG 12hr tablet Take 1 tablet by mouth 2 (two) times daily.  . furosemide (LASIX) 40 MG tablet TAKE 1 OR 2 TABLETS ONCE DAILY FOR BLOOD PRESSURE AND ANKLE SWELLING.  . hydrALAZINE (APRESOLINE) 100 MG tablet Take 1 tablet (100 mg total) by mouth 3 (three) times daily. KEEP OV.  Marland Kitchen LEVEMIR FLEXTOUCH 100 UNIT/ML Pen INJECT 50 UNITS IN THE AM AND 25 UNITS IN THE PM  . LUMIGAN 0.01 % SOLN Place 1 drop into the right eye at bedtime.   . Multiple Vitamin (MULTIVITAMIN) tablet Take 1 tablet by mouth daily.    Marland Kitchen MYRBETRIQ 50 MG TB24 tablet Take 50 mg by mouth daily.   . ONE TOUCH ULTRA TEST test strip CHECK BLOOD SUGAR 6 TIMES A DAY.  . Probiotic Product (PROBIOTIC DAILY PO) Take 1 tablet by mouth daily.   Marland Kitchen senna-docusate (SENOKOT-S) 8.6-50 MG tablet Take 1 tablet by mouth at bedtime. (Patient taking differently: Take 1 tablet by mouth as needed. )  . SIMBRINZA 1-0.2 % SUSP Place 1 drop into both eyes 3 (three) times daily.   No current facility-administered medications on file prior to visit.     ROS: Review of Systems  Constitutional:  Positive for malaise/fatigue. Negative for chills, diaphoresis, fever and weight loss.  HENT: Negative for sore throat.   Eyes: Negative for blurred vision.  Respiratory: Positive for cough and shortness of breath. Negative for hemoptysis, sputum production and wheezing.   Cardiovascular: Positive for orthopnea and leg swelling. Negative for chest pain, palpitations, claudication and PND.  Gastrointestinal: Negative for nausea and vomiting.  Genitourinary: Negative.   Musculoskeletal: Negative for falls.  Skin: Negative for rash.  Neurological: Negative for dizziness, loss of consciousness and headaches.     Physical Exam:  BP (!) 158/60   Pulse 78   Temp 97.7 F (36.5 C)   Ht 5\' 11"  (1.803 m)   Wt 240 lb 9.6 oz (109.1 kg)   SpO2 90%   BMI 33.56 kg/m   General Appearance: Well nourished elder in wheelchair, in  no apparent distress. Eyes: PERRLA, EOMs, conjunctiva no swelling or erythema Sinuses: No Frontal/maxillary tenderness ENT/Mouth: Ext aud canals clear, TMs without erythema, bulging. No erythema, swelling, or exudate on post pharynx.  Tonsils not swollen or erythematous. Hearing normal.  Neck: Supple.  Respiratory: Respiratory effort normal, BS present throught, scattered rales to bilateral bases without rhonchi, wheezing or stridor.  Cardio: RRR with no MRGs, bounding upper extremity pulses, bilateral lower extremities with 2+ pitting edema, feet through calves, multiple blisters and weeping, left shin with ulcerative wound Abdomen: Soft, + BS.  Non tender, no guarding, rebound, hernias, masses. Lymphatics: Non tender without lymphadenopathy.  Musculoskeletal: Full ROM, 5/5 strength, normal gait.  Skin: bilateral lower legs with shiny, taut skin, multiple small blisers and weeping serous discharge, left shin with superficial ulcerative wound, approx 3 cm x 4 cm with pink tissue, no surrounding erythema, serous discharge present at wound site Neuro: Sensation intact.   Psych: Awake and oriented X 2, normal affect, Insight and Judgment appropriate.    Izora Ribas, NP 1:39 PM [facility]

## 2018-01-16 ENCOUNTER — Inpatient Hospital Stay (HOSPITAL_COMMUNITY)
Admission: EM | Admit: 2018-01-16 | Discharge: 2018-01-20 | DRG: 291 | Disposition: A | Discharge status: Home Health | Payer: Medicare Other | Attending: Internal Medicine | Admitting: Internal Medicine

## 2018-01-16 ENCOUNTER — Emergency Department (HOSPITAL_COMMUNITY): Payer: Medicare Other

## 2018-01-16 ENCOUNTER — Encounter (HOSPITAL_COMMUNITY): Payer: Self-pay

## 2018-01-16 ENCOUNTER — Encounter: Payer: Self-pay | Admitting: Adult Health

## 2018-01-16 ENCOUNTER — Other Ambulatory Visit: Payer: Self-pay

## 2018-01-16 ENCOUNTER — Ambulatory Visit: Payer: Medicare Other | Admitting: Adult Health

## 2018-01-16 ENCOUNTER — Ambulatory Visit: Payer: Medicare Other | Admitting: Podiatry

## 2018-01-16 VITALS — BP 158/60 | HR 78 | Temp 97.7°F | Ht 71.0 in | Wt 240.6 lb

## 2018-01-16 DIAGNOSIS — E118 Type 2 diabetes mellitus with unspecified complications: Secondary | ICD-10-CM | POA: Diagnosis present

## 2018-01-16 DIAGNOSIS — E11622 Type 2 diabetes mellitus with other skin ulcer: Secondary | ICD-10-CM | POA: Diagnosis present

## 2018-01-16 DIAGNOSIS — R609 Edema, unspecified: Secondary | ICD-10-CM

## 2018-01-16 DIAGNOSIS — E1122 Type 2 diabetes mellitus with diabetic chronic kidney disease: Secondary | ICD-10-CM | POA: Diagnosis present

## 2018-01-16 DIAGNOSIS — E871 Hypo-osmolality and hyponatremia: Secondary | ICD-10-CM | POA: Diagnosis present

## 2018-01-16 DIAGNOSIS — I5033 Acute on chronic diastolic (congestive) heart failure: Secondary | ICD-10-CM | POA: Diagnosis present

## 2018-01-16 DIAGNOSIS — Z96642 Presence of left artificial hip joint: Secondary | ICD-10-CM | POA: Diagnosis present

## 2018-01-16 DIAGNOSIS — I5032 Chronic diastolic (congestive) heart failure: Secondary | ICD-10-CM

## 2018-01-16 DIAGNOSIS — N184 Chronic kidney disease, stage 4 (severe): Secondary | ICD-10-CM | POA: Diagnosis present

## 2018-01-16 DIAGNOSIS — R0602 Shortness of breath: Secondary | ICD-10-CM | POA: Diagnosis present

## 2018-01-16 DIAGNOSIS — F0280 Dementia in other diseases classified elsewhere without behavioral disturbance: Secondary | ICD-10-CM | POA: Diagnosis present

## 2018-01-16 DIAGNOSIS — G301 Alzheimer's disease with late onset: Secondary | ICD-10-CM | POA: Diagnosis present

## 2018-01-16 DIAGNOSIS — Z9079 Acquired absence of other genital organ(s): Secondary | ICD-10-CM | POA: Diagnosis not present

## 2018-01-16 DIAGNOSIS — R5381 Other malaise: Secondary | ICD-10-CM | POA: Diagnosis present

## 2018-01-16 DIAGNOSIS — Z7982 Long term (current) use of aspirin: Secondary | ICD-10-CM | POA: Diagnosis not present

## 2018-01-16 DIAGNOSIS — I10 Essential (primary) hypertension: Secondary | ICD-10-CM | POA: Diagnosis present

## 2018-01-16 DIAGNOSIS — Z79899 Other long term (current) drug therapy: Secondary | ICD-10-CM | POA: Diagnosis not present

## 2018-01-16 DIAGNOSIS — L97929 Non-pressure chronic ulcer of unspecified part of left lower leg with unspecified severity: Secondary | ICD-10-CM | POA: Diagnosis present

## 2018-01-16 DIAGNOSIS — Z794 Long term (current) use of insulin: Secondary | ICD-10-CM

## 2018-01-16 DIAGNOSIS — R188 Other ascites: Secondary | ICD-10-CM

## 2018-01-16 DIAGNOSIS — E785 Hyperlipidemia, unspecified: Secondary | ICD-10-CM | POA: Diagnosis present

## 2018-01-16 DIAGNOSIS — Z833 Family history of diabetes mellitus: Secondary | ICD-10-CM | POA: Diagnosis not present

## 2018-01-16 DIAGNOSIS — Z8249 Family history of ischemic heart disease and other diseases of the circulatory system: Secondary | ICD-10-CM | POA: Diagnosis not present

## 2018-01-16 DIAGNOSIS — I872 Venous insufficiency (chronic) (peripheral): Secondary | ICD-10-CM

## 2018-01-16 DIAGNOSIS — I5030 Unspecified diastolic (congestive) heart failure: Secondary | ICD-10-CM

## 2018-01-16 DIAGNOSIS — Z87891 Personal history of nicotine dependence: Secondary | ICD-10-CM

## 2018-01-16 DIAGNOSIS — I130 Hypertensive heart and chronic kidney disease with heart failure and stage 1 through stage 4 chronic kidney disease, or unspecified chronic kidney disease: Principal | ICD-10-CM | POA: Diagnosis present

## 2018-01-16 DIAGNOSIS — L97821 Non-pressure chronic ulcer of other part of left lower leg limited to breakdown of skin: Secondary | ICD-10-CM

## 2018-01-16 LAB — CBC WITH DIFFERENTIAL/PLATELET
BASOS ABS: 0 10*3/uL (ref 0.0–0.1)
Basophils Relative: 0 %
EOS ABS: 0 10*3/uL (ref 0.0–0.7)
EOS PCT: 1 %
HCT: 34 % — ABNORMAL LOW (ref 39.0–52.0)
HEMOGLOBIN: 11.5 g/dL — AB (ref 13.0–17.0)
Lymphocytes Relative: 19 %
Lymphs Abs: 1.1 10*3/uL (ref 0.7–4.0)
MCH: 29 pg (ref 26.0–34.0)
MCHC: 33.8 g/dL (ref 30.0–36.0)
MCV: 85.6 fL (ref 78.0–100.0)
Monocytes Absolute: 0.5 10*3/uL (ref 0.1–1.0)
Monocytes Relative: 8 %
NEUTROS PCT: 72 %
Neutro Abs: 4.3 10*3/uL (ref 1.7–7.7)
PLATELETS: 246 10*3/uL (ref 150–400)
RBC: 3.97 MIL/uL — AB (ref 4.22–5.81)
RDW: 13.4 % (ref 11.5–15.5)
WBC: 5.9 10*3/uL (ref 4.0–10.5)

## 2018-01-16 LAB — COMPREHENSIVE METABOLIC PANEL
ALT: 27 U/L (ref 0–44)
AST: 29 U/L (ref 15–41)
Albumin: 3.9 g/dL (ref 3.5–5.0)
Alkaline Phosphatase: 56 U/L (ref 38–126)
Anion gap: 6 (ref 5–15)
BILIRUBIN TOTAL: 0.5 mg/dL (ref 0.3–1.2)
BUN: 34 mg/dL — AB (ref 8–23)
CO2: 24 mmol/L (ref 22–32)
CREATININE: 1.54 mg/dL — AB (ref 0.61–1.24)
Calcium: 8.6 mg/dL — ABNORMAL LOW (ref 8.9–10.3)
Chloride: 96 mmol/L — ABNORMAL LOW (ref 98–111)
GFR, EST AFRICAN AMERICAN: 44 mL/min — AB (ref >60)
GFR, EST NON AFRICAN AMERICAN: 38 mL/min — AB (ref >60)
Glucose, Bld: 207 mg/dL — ABNORMAL HIGH (ref 70–99)
Potassium: 3.8 mmol/L (ref 3.5–5.1)
Sodium: 126 mmol/L — ABNORMAL LOW (ref 135–145)
TOTAL PROTEIN: 7.3 g/dL (ref 6.5–8.1)

## 2018-01-16 LAB — TROPONIN I
TROPONIN I: 0.08 ng/mL — AB (ref <0.03)
TROPONIN I: 0.08 ng/mL — AB (ref <0.03)

## 2018-01-16 LAB — PROTIME-INR
INR: 1.05
PROTHROMBIN TIME: 13.6 s (ref 11.4–15.2)

## 2018-01-16 LAB — GLUCOSE, CAPILLARY
GLUCOSE-CAPILLARY: 208 mg/dL — AB (ref 70–99)
GLUCOSE-CAPILLARY: 226 mg/dL — AB (ref 70–99)

## 2018-01-16 LAB — BRAIN NATRIURETIC PEPTIDE: B Natriuretic Peptide: 552.9 pg/mL — ABNORMAL HIGH (ref 0.0–100.0)

## 2018-01-16 MED ORDER — INSULIN ASPART 100 UNIT/ML ~~LOC~~ SOLN
0.0000 [IU] | Freq: Every day | SUBCUTANEOUS | Status: DC
  Administered 2018-01-16: 2 [IU] via SUBCUTANEOUS
  Administered 2018-01-17: 3 [IU] via SUBCUTANEOUS
  Administered 2018-01-18: 2 [IU] via SUBCUTANEOUS

## 2018-01-16 MED ORDER — HYDRALAZINE HCL 50 MG PO TABS
100.0000 mg | ORAL_TABLET | Freq: Three times a day (TID) | ORAL | Status: DC
  Administered 2018-01-16 – 2018-01-20 (×11): 100 mg via ORAL
  Filled 2018-01-16 (×11): qty 2

## 2018-01-16 MED ORDER — MIRABEGRON ER 25 MG PO TB24
50.0000 mg | ORAL_TABLET | Freq: Every day | ORAL | Status: DC
  Administered 2018-01-17 – 2018-01-20 (×4): 50 mg via ORAL
  Filled 2018-01-16 (×4): qty 2

## 2018-01-16 MED ORDER — DICLOFENAC SODIUM 1 % TD GEL
2.0000 g | Freq: Four times a day (QID) | TRANSDERMAL | Status: DC
Start: 2018-01-17 — End: 2018-01-20
  Administered 2018-01-17 – 2018-01-19 (×11): 2 g via TOPICAL
  Filled 2018-01-16: qty 100

## 2018-01-16 MED ORDER — DM-GUAIFENESIN ER 30-600 MG PO TB12
1.0000 | ORAL_TABLET | Freq: Two times a day (BID) | ORAL | Status: DC
  Administered 2018-01-16 – 2018-01-20 (×8): 1 via ORAL
  Filled 2018-01-16 (×8): qty 1

## 2018-01-16 MED ORDER — CARVEDILOL 6.25 MG PO TABS
6.2500 mg | ORAL_TABLET | Freq: Two times a day (BID) | ORAL | Status: DC
  Administered 2018-01-16 – 2018-01-20 (×8): 6.25 mg via ORAL
  Filled 2018-01-16 (×8): qty 1

## 2018-01-16 MED ORDER — FUROSEMIDE 10 MG/ML IJ SOLN
20.0000 mg | Freq: Once | INTRAMUSCULAR | Status: AC
  Administered 2018-01-16: 20 mg via INTRAVENOUS
  Filled 2018-01-16: qty 2

## 2018-01-16 MED ORDER — ACETAMINOPHEN 325 MG PO TABS
650.0000 mg | ORAL_TABLET | Freq: Four times a day (QID) | ORAL | Status: DC | PRN
Start: 2018-01-16 — End: 2018-01-20
  Administered 2018-01-17: 650 mg via ORAL
  Filled 2018-01-16: qty 2

## 2018-01-16 MED ORDER — PANTOPRAZOLE SODIUM 40 MG IV SOLR: 40.0000 mg | Freq: Once | INTRAVENOUS | Status: DC

## 2018-01-16 MED ORDER — ASPIRIN EC 81 MG PO TBEC
81.0000 mg | DELAYED_RELEASE_TABLET | Freq: Every day | ORAL | Status: DC
  Administered 2018-01-17 – 2018-01-20 (×4): 81 mg via ORAL
  Filled 2018-01-16 (×3): qty 1

## 2018-01-16 MED ORDER — FUROSEMIDE 10 MG/ML IJ SOLN
40.0000 mg | Freq: Once | INTRAMUSCULAR | Status: AC
  Administered 2018-01-16: 40 mg via INTRAVENOUS
  Filled 2018-01-16: qty 4

## 2018-01-16 MED ORDER — INSULIN ASPART 100 UNIT/ML ~~LOC~~ SOLN
0.0000 [IU] | Freq: Three times a day (TID) | SUBCUTANEOUS | Status: DC
  Administered 2018-01-16: 5 [IU] via SUBCUTANEOUS
  Administered 2018-01-17: 2 [IU] via SUBCUTANEOUS
  Administered 2018-01-17 (×2): 3 [IU] via SUBCUTANEOUS
  Administered 2018-01-18: 8 [IU] via SUBCUTANEOUS
  Administered 2018-01-18 (×2): 3 [IU] via SUBCUTANEOUS
  Administered 2018-01-19: 11 [IU] via SUBCUTANEOUS
  Administered 2018-01-19 – 2018-01-20 (×3): 5 [IU] via SUBCUTANEOUS
  Administered 2018-01-20: 3 [IU] via SUBCUTANEOUS

## 2018-01-16 MED ORDER — HEPARIN SODIUM (PORCINE) 5000 UNIT/ML IJ SOLN
5000.0000 [IU] | Freq: Three times a day (TID) | INTRAMUSCULAR | Status: DC
  Administered 2018-01-16 – 2018-01-20 (×10): 5000 [IU] via SUBCUTANEOUS
  Filled 2018-01-16 (×11): qty 1

## 2018-01-16 MED ORDER — FUROSEMIDE 10 MG/ML IJ SOLN
60.0000 mg | Freq: Two times a day (BID) | INTRAMUSCULAR | Status: DC
  Administered 2018-01-17: 60 mg via INTRAVENOUS
  Filled 2018-01-16: qty 6

## 2018-01-16 MED ORDER — SENNOSIDES-DOCUSATE SODIUM 8.6-50 MG PO TABS
1.0000 | ORAL_TABLET | Freq: Every day | ORAL | Status: DC
  Administered 2018-01-16 – 2018-01-18 (×3): 1 via ORAL
  Filled 2018-01-16 (×4): qty 1

## 2018-01-16 NOTE — ED Notes (Signed)
Date and time results received: 01/16/18 15:01  Test: Troponin  Critical Value: 0.08  Name of Provider Notified: Dr. Francia Greaves via telephone and Kandice Moos, RN (primary nurse) via department radio.   Orders Received? Or Actions Taken?: Will continue to monitor and await for new orders.

## 2018-01-16 NOTE — Patient Instructions (Addendum)
Take 60 mg of lasix this afternoon and tomorrow morning  Keep the leg wound clean  Follow up with Dr. Debara Pickett or associate within 2-3 days    Heart Failure Exacerbation  Heart failure is a condition in which the heart does not fill up with enough blood, and therefore does not pump enough blood and oxygen to the body. When this happens, parts of the body do not get the blood and oxygen they need to function properly. This can cause symptoms such as breathing problems, fatigue, swelling, and confusion. Heart failure exacerbation refers to heart failure symptoms that get worse. The symptoms may get worse suddenly or develop slowly over time. Heart failure exacerbation is a serious medical problem that should be treated right away. What are the causes? A heart failure exacerbation can be triggered by:  Not taking your heart failure medicines correctly.  Infections.  Eating an unhealthy diet or a diet that is high in salt (sodium).  Drinking too much fluid.  Drinking alcohol.  Taking illegal drugs, such as cocaine or methamphetamine.  Not exercising.  Other causes include:  Other heart conditions such as an irregular heartbeat (arrhythmia).  Anemia.  Other medical problems, such as kidney failure.  Sometimes the cause of the exacerbation is not known. What are the signs or symptoms? When heart failure symptoms suddenly or slowly get worse, this may be a sign of heart failure exacerbation. Symptoms of heart failure include:  Breathing problems or shortness of breath.  Chronic coughing or wheezing.  Fatigue.  Nausea or lack of appetite.  Feeling light-headed.  Confusion or memory loss.  Increased heart rate or irregular heartbeat.  Buildup of fluid in the legs, ankles, feet, or abdomen.  Difficulty breathing when lying down.  How is this diagnosed? This condition is diagnosed based on:  Your symptoms and medical history.  A physical exam.  You may also have  tests, including:  Electrocardiogram (ECG). This test measures the electrical activity of your heart.  Echocardiogram. This test uses sound waves to take a picture of your heart to see how well it works.  Blood tests.  Imaging tests, such as: ? Chest X-ray. ? MRI. ? Ultrasound.  Stress test. This test examines how well your heart functions when you exercise. Your heart is monitored while you exercise on a treadmill or exercise bike. If you cannot exercise, medicines may be used to increase your heartbeat in place of exercise.  Cardiac catheterization. During this test, a thin, flexible tube (catheter) is inserted into a blood vessel and threaded up to your heart. This test allows your health care provider to check the arteries that lead to your heart (coronary arteries).  Right heart catheterization. During this test, the pressure in your heart is measured.  How is this treated? This condition may be treated by:  Adjusting your heart medicines.  Maintaining a healthy lifestyle. This includes: ? Eating a heart-healthy diet that is low in sodium. ? Not using any products that contain nicotine or tobacco, such as cigarettes and e-cigarettes. ? Regular exercise. ? Monitoring your fluid intake. ? Monitoring your weight and reporting changes to your health care provider.  Treating sleep apnea, if you have this condition.  Surgery. This may include: ? Implanting a device that helps both sides of your heart contract at the same time (cardiac resynchronization therapy device). This can help with heart function and relieve heart failure symptoms. ? Implanting a device that can correct heart rhythm problems (implantable cardioverter defibrillator). ?  Connecting a device to your heart to help it pump blood (ventricular assist device). ? Heart transplant.  Follow these instructions at home: Medicines  Take over-the-counter and prescription medicines only as told by your health care  provider.  Do not stop taking your medicines or change the amount you take. If you are having problems or side effects from your medicines, talk to your health care provider.  If you are having difficulty paying for your medicines, contact a social worker or your clinic. There are many programs to assist with medicine costs.  Talk to your health care provider before starting any new medicines or supplements.  Make sure your health care provider and pharmacist have a list of all the medicines you are taking. Eating and drinking  Avoid drinking alcohol.  Eat a heart-healthy diet as told by your health care provider. This includes: ? Plenty of fruits and vegetables. ? Lean proteins. ? Low-fat dairy. ? Whole grains. ? Foods that are low in sodium. Activity  Exercise regularly as told by your health care provider. Balance exercise with rest.  Ask your health care provider what activities are safe for you. This includes sexual activity, exercise, and daily tasks at home or work. Lifestyle  Do not use any products that contain nicotine or tobacco, such as cigarettes and e-cigarettes. If you need help quitting, ask your health care provider.  Maintain a healthy weight. Ask your health care provider what weight is healthy for you.  Consider joining a patient support group. This can help with emotional problems you may have, such as stress and anxiety. General instructions  Talk to your health care provider about flu and pneumonia vaccines.  Keep a list of medicines that you are taking. This may help in emergency situations.  Keep all follow-up visits as told by your health care provider. This is important. Contact a health care provider if:  You have questions about your medicines or you miss a dose.  You feel anxious, depressed, or stressed.  You have swelling in your feet, ankles, legs, or abdomen.  You have shortness of breath during activity or exercise.  You have a  cough.  You have a fever.  You have trouble sleeping.  You gain 2-3 lb (1-1.4 kg) in 24 hours or 5 lb (2.3 kg) in a week. Get help right away if:  You have chest pain.  You have shortness of breath while resting.  You have severe fatigue.  You are confused.  You have severe dizziness.  You have a rapid or irregular heartbeat.  You have nausea or you vomit.  You have a cough that is worse at night or you cannot lie flat.  You have a cough that will not go away.  You have severe depression or sadness. Summary  When heart failure symptoms get worse, it is called heart failure exacerbation.  Common causes of this condition include taking medicines incorrectly, infections, and drinking alcohol.  This condition may be treated by adjusting medicines, maintaining a healthy lifestyle, or surgery.  Do not stop taking your medicines or change the amount you take. If you are having problems or side effects from your medicines, talk to your health care provider. This information is not intended to replace advice given to you by your health care provider. Make sure you discuss any questions you have with your health care provider. Document Released: 10/18/2016 Document Revised: 10/18/2016 Document Reviewed: 10/18/2016 Elsevier Interactive Patient Education  2018 [employer].   Venous  Ulcer A venous ulcer is a shallow sore on your lower leg that is caused by poor circulation in your veins. This condition used to be called stasis ulcer. Veins have valves that help return blood to the heart. If these valves do not work properly, it can cause blood to flow backward and to back up into the veins near the skin. When that happens, blood can pool in your lower legs. The blood can then leak out of your veins, which can irritate your skin. This may cause a break in your skin that becomes a venous ulcer. Venous ulcer is the most common type of lower leg ulcer. You may have venous ulcers on one  leg or on both legs. The area where this condition most commonly develops is around the ankles. A venous ulcer may last for a long time (chronic ulcer) or it may return repeatedly (recurrent ulcer). What are the causes? Any condition that causes poor circulation to your legs can lead to a venous ulcer. What increases the risk? This condition is more likely to develop in:  People who are 16 years of age or older.  People who are overweight.  People who are not active.  People who have had a leg ulcer in the past.  People who have clots in their lower leg veins (deep vein thrombosis).  People who have inflammation of their leg veins (phlebitis).  Women who have given birth.  People who smoke.  What are the signs or symptoms? The most common symptom of this condition is an open sore near your ankle. Other symptoms may include:  Swelling.  Thickening of the skin.  Fluid leaking from the ulcer.  Bleeding.  Itching.  Pain and swelling that gets worse when you stand up and feels better when you raise your leg.  Blotchy skin.  Darkening of the skin.  How is this diagnosed? Your health care provider may suspect a venous ulcer based on your medical history and your risk factors. Your health care provider will check the skin on your legs. Other tests may be done to learn more about the ulcer and to determine the best way to treat it. Tests that may be done include:  Measuring the blood pressure in your arms and legs.  Using sound waves (ultrasound) to measure the blood flow in your leg veins.  How is this treated? You may need to try several different types of treatment to get your venous ulcer to heal. Healing may take a long time. Treatment may include:  Keeping your leg raised (elevated).  Wearing a type of bandage or stocking to compress the veins of your leg (compression therapy). Venous wounds are not likely to heal or to stay healed without compression.  Taking  medicines to improve blood flow.  Taking antibiotic medicines to treat infection.  Cleaning your ulcer and removing any dead tissue from the wound (debridement).  Placing various types of medicated bandage (dressings) or medicated wraps on your ulcer. This helps the ulcer to heal and helps to prevent infection.  Surgery is sometimes needed to close the wound using a piece of skin taken from another area of your body (graft). You may need surgery if other treatments are not working or if your ulcer is very deep. Follow these instructions at home: Wound care  Follow instructions from your health care provider about: ? How to take care of your wound. ? When and how you should change your bandage (dressing). ? When you should  remove your dressing. If your dressing is dry and sticks to your leg when you try to remove it, moisten or wet the dressing with saline solution or water so that the dressing can be removed without harming your skin or wound tissue.  Check your wound every day for signs of infection. Have a caregiver do this for you if you are not able to do it yourself. Check for: ? More redness, swelling, or pain. ? More fluid or blood. ? Pus, warmth, or a bad smell. Medicines  Take over-the-counter and prescription medicines only as told by your health care provider.  If you were prescribed an antibiotic medicine, take it or apply it as told by your health care provider. Do not stop taking or using the antibiotic even if your condition improves. Activity  Do not stand or sit in one position for a long period of time. Rest with your legs raised during the day. If possible, keep your legs above your heart for 30 minutes, 3-4 times a day, or as told by your health care provider.  Do not sit with your legs crossed.  Walk often to increase the blood flow in your legs.Ask your health care provider what level of activity is safe for you.  If you are taking a long ride in a car or  plane, take a break to walk around at least once every two hours, or as often as your health care provider recommends. Ask your health care provider if you should take aspirin before long trips. General instructions   Wear elastic stockings, compression stockings, or support hose as told by your health care provider. This is very important.  Raise the foot of your bed as told by your health care provider.  Do not smoke.  Keep all follow-up visits as told by your health care provider. This is important. Contact a health care provider if:  You have a fever.  Your ulcer is getting larger or is not healing.  Your pain gets worse.  You have more redness or swelling around your ulcer.  You have more fluid, blood, or pus coming from your ulcer after it has been cleaned by you or your health care provider.  You have warmth or a bad smell coming from your ulcer. This information is not intended to replace advice given to you by your health care provider. Make sure you discuss any questions you have with your health care provider. Document Released: 03/01/2001 Document Revised: 11/12/2015 Document Reviewed: 10/15/2014 Elsevier Interactive Patient Education  [employer].

## 2018-01-16 NOTE — Progress Notes (Signed)
Thanks .Marland Kitchen Looks like we are seeing him on 8/1.  Dr. Lemmie Evens

## 2018-01-16 NOTE — ED Triage Notes (Signed)
Pt brought in by caregiver. Complains of SHOB on exertion and bilateral feet swelling x5 days. Seen at PCP this morning. Hx of CHF. Reports taking Lasix as prescribed. Denies chest pain.

## 2018-01-16 NOTE — H&P (Addendum)
History and Physical    [name]:[id number] DOB: 09-20-1925 DOA: 01/16/2018  PCP: [name], MD   Patient coming from: Home  Chief Complaint: Shortness of breath,leg edema  HPI: Gregory Hatfield is a 84 y.o. male with medical history significant of diastolic CHF, CKD stage IV, hypertension, hyperlipidemia who presents from home for evaluation of increased shortness of breath, worsening lower extremity edema.  He is on Lasix at home.  Patient and family reports that his bilateral lower extremity edema has been worsening and he has been having increased shortness of breath on minimal exertion.  Also reported of cough. Patient was seen at his cardiologist's office.  He was recommended to take 60 mg of Lasix twice a day and was sent home.  On the way back to home he became very short of breath and had to be brought to the emergency department.  Family reported that there was improvement of his weight by 15 pounds in last 4 days.  Patient takes his daily weight.  As per the daughter he has been having increase water intake recently.  He takes 2 glasses of water during his each meal. When patient arrived in the emergency department, his oxygen saturation was 90%.  Patient seen and examined the emergency department at the bedside.  Family members were present at the bedside.  He denies any chest pain, palpitations, fever, chills, nausea, vomiting, diarrhea, abdominal pain or headache.  ED Course: Chest x-ray was suggestive of CHF.  Patient was given 40 mg of IV Lasix by the emergency physician.  Review of Systems: As per HPI otherwise 10 point review of systems negative.    Past Medical History:  Diagnosis Date  . Bradycardia 10/06/2016  . CHF (congestive heart failure) ([place])   . CKD stage 4 due to type 2 diabetes mellitus ([place]) 06/03/2015  . Dyslipidemia associated with type 2 diabetes mellitus ([place]) 05/05/2015  . Essential hypertension 05/05/2015  . IDDM (insulin dependent  diabetes mellitus) ([place])   . SDAT (senile dementia of Alzheimer's type) 06/16/2013  . Uncontrolled insulin-dependent diabetes mellitus with renal manifestation ([place]) 05/05/2015    Past Surgical History:  Procedure Laterality Date  . CARDIAC CATHETERIZATION  04/2004   normal r-sided pressure, mild pulm HTN  . CARPAL TUNNEL RELEASE Right   . CERVICAL SPINE SURGERY  1990's  . LUMBAR SPINE SURGERY  2003  . PROSTATE SURGERY  10/2010   transurethral resection   . TOTAL HIP ARTHROPLASTY Left 01/17/2014   Procedure: LEFT TOTAL HIP ARTHROPLASTY;  Surgeon: Yvette Rack., MD;  Location: [facility];  Service: Orthopedics;  Laterality: Left;  . TRANSTHORACIC ECHOCARDIOGRAM  05/2006   EF 60-70%; mild calcif of MV; mild MV regurg; LA mildly dilated  . TRANSURETHRAL RESECTION OF PROSTATE N/A 08/22/2014   Procedure: TRANSURETHRAL RESECTION OF THE PROSTATE (TURP);  Surgeon: Marcia Brash, MD;  Location: [facility];  Service: Urology;  Laterality: N/A;     reports that he quit smoking about 34 years ago. He has never used smokeless tobacco. He reports that he does not drink alcohol or use drugs.  Allergies  Allergen Reactions  . Ace Inhibitors Other (See Comments) and Cough    Chronic cough  . Demerol Other (See Comments)    Hard to wake up  . Cosopt [Dorzolamide Hcl-Timolol Mal] Other (See Comments)    Cause heart rate to drop  . Flomax [Tamsulosin Hcl] Other (See Comments)    Low BP  . Morphine And Related Other (  See Comments)    hallucinations  . Oxytrol [Oxybutynin] Rash    Family History  Problem Relation Age of Onset  . Diabetes Mother   . Hypertension Mother   . Prostate cancer Father   . Heart disease Father   . Diabetes Brother   . Cancer Brother   . Heart disease Brother   . Diabetes Sister   . Heart disease Sister   . Hyperlipidemia Child   . Hypertension Child   . Diabetes Child   . Heart attack Child   . Cancer Child      Prior to Admission medications   Medication Sig  Start Date End Date Taking? Authorizing Provider  amLODipine (NORVASC) 5 MG tablet TAKE 1 TABLET ONCE DAILY. 10/30/17  Yes Hilty, Nadean Corwin, MD  aspirin 81 MG tablet Take 81 mg by mouth daily.   Yes [provider]  carvedilol (COREG) 6.25 MG tablet TAKE 1 TABLET BY MOUTH TWICE DAILY WITH A MEAL. 07/07/17  Yes [name], MD  Cholecalciferol (VITAMIN D3) 5000 UNITS CAPS Take 5,000 Units by mouth 2 (two) times daily.    Yes [provider]  dextromethorphan-guaiFENesin (MUCINEX DM) 30-600 MG 12hr tablet Take 1 tablet by mouth 2 (two) times daily.   Yes [provider]  furosemide (LASIX) 40 MG tablet TAKE 1 OR 2 TABLETS ONCE DAILY FOR BLOOD PRESSURE AND ANKLE SWELLING. 10/24/17  Yes [name], MD  hydrALAZINE (APRESOLINE) 100 MG tablet Take 1 tablet (100 mg total) by mouth 3 (three) times daily. KEEP OV. 12/27/17  Yes Meng, Hao, PA  LEVEMIR FLEXTOUCH 100 UNIT/ML Pen INJECT 50 UNITS IN THE AM AND 25 UNITS IN THE PM 01/13/18  Yes Renato Shin, MD  LUMIGAN 0.01 % SOLN Place 1 drop into the right eye at bedtime.  03/29/13  Yes [provider]  Multiple Vitamin (MULTIVITAMIN) tablet Take 1 tablet by mouth daily.     Yes [provider]  MYRBETRIQ 50 MG TB24 tablet Take 50 mg by mouth daily.  12/10/14  Yes [provider]  Probiotic Product (PROBIOTIC DAILY PO) Take 1 tablet by mouth daily.    Yes [provider]  senna-docusate (SENOKOT-S) 8.6-50 MG tablet Take 1 tablet by mouth at bedtime. Patient taking differently: Take 1 tablet by mouth as needed.  05/09/15  Yes Eugenie Filler, MD  SIMBRINZA 1-0.2 % SUSP Place 1 drop into both eyes 3 (three) times daily. 02/28/13  Yes [provider]  B-D UF III MINI PEN NEEDLES 31G X 5 MM MISC USE FOR INJECTIONS AS DIRECTED 12/27/17   Renato Shin, MD  ONE TOUCH ULTRA TEST test strip CHECK BLOOD SUGAR 6 TIMES A DAY. 02/12/17   Renato Shin, MD    Physical Exam: Vitals:   01/16/18  1234 01/16/18 1404  BP: (!) 177/74 (!) 169/77  Pulse: 66 62  Resp: 20 (!) 23  Temp: 98.4 F (36.9 C)   TempSrc: Oral   SpO2: 90% 98%    Constitutional: NAD, calm, comfortable,obese Vitals:   01/16/18 1234 01/16/18 1404  BP: (!) 177/74 (!) 169/77  Pulse: 66 62  Resp: 20 (!) 23  Temp: 98.4 F (36.9 C)   TempSrc: Oral   SpO2: 90% 98%   Eyes: PERRL, lids and conjunctivae normal ENMT: Mucous membranes are moist. Posterior pharynx clear of any exudate or lesions.Normal dentition.  Neck: normal, supple, no masses, no thyromegaly Respiratory: Bilateral decreased air entry.  NOrmal respiratory effort. No accessory muscle use.  Cardiovascular:  Regular rate and rhythm, no murmurs / rubs / gallops.Severe lower extremity edema. 2+ pedal pulses. No carotid bruits.  Abdomen: no tenderness, no masses palpated. No hepatosplenomegaly. Bowel sounds positive. Distended Musculoskeletal: no clubbing / cyanosis. No joint deformity upper and lower extremities. Good ROM, no contractures. Normal muscle tone.  Skin: no rashes, lesions.No induration Ulcer on the left lower extremity Neurologic: CN 2-12 grossly intact. Sensation intact, DTR normal. Strength 5/5 in all 4.  Psychiatric: Normal judgment and insight. Alert and oriented x 3. Normal mood.   Foley Catheter:None  Labs on Admission: I have personally reviewed following labs and imaging studies  CBC: Recent Labs  Lab 01/16/18 1404  WBC 5.9  NEUTROABS 4.3  HGB 11.5*  HCT 34.0*  MCV 85.6  PLT 716   Basic Metabolic Panel: Recent Labs  Lab 01/16/18 1404  NA 126*  K 3.8  CL 96*  CO2 24  GLUCOSE 207*  BUN 34*  CREATININE 1.54*  CALCIUM 8.6*   GFR: Estimated Creatinine Clearance: 40 mL/min (A) (by C-G formula based on SCr of 1.54 mg/dL (H)). Liver Function Tests: Recent Labs  Lab 01/16/18 1404  AST 29  ALT 27  ALKPHOS 56  BILITOT 0.5  PROT 7.3  ALBUMIN 3.9   No results for input(s): LIPASE, AMYLASE in the last 168  hours. No results for input(s): AMMONIA in the last 168 hours. Coagulation Profile: Recent Labs  Lab 01/16/18 1404  INR 1.05   Cardiac Enzymes: Recent Labs  Lab 01/16/18 1404  TROPONINI 0.08*   BNP (last 3 results) No results for input(s): PROBNP in the last 8760 hours. HbA1C: No results for input(s): HGBA1C in the last 72 hours. CBG: No results for input(s): GLUCAP in the last 168 hours. Lipid Profile: No results for input(s): CHOL, HDL, LDLCALC, TRIG, CHOLHDL, LDLDIRECT in the last 72 hours. Thyroid Function Tests: No results for input(s): TSH, T4TOTAL, FREET4, T3FREE, THYROIDAB in the last 72 hours. Anemia Panel: No results for input(s): VITAMINB12, FOLATE, FERRITIN, TIBC, IRON, RETICCTPCT in the last 72 hours. Urine analysis:    Component Value Date/Time   COLORURINE YELLOW 12/20/2017 [place] 12/20/2017 1208   LABSPEC 1.009 12/20/2017 1208   PHURINE 6.0 12/20/2017 1208   GLUCOSEU NEGATIVE 12/20/2017 1208   HGBUR NEGATIVE 12/20/2017 [place] 10/06/2016 1249   KETONESUR NEGATIVE 12/20/2017 1208   PROTEINUR NEGATIVE 12/20/2017 1208   UROBILINOGEN 0.2 05/05/2015 1023   NITRITE NEGATIVE 02/16/2017 1021   LEUKOCYTESUR NEGATIVE 02/16/2017 1021    Radiological Exams on Admission: Dg Chest 2 View  Result Date: 01/16/2018 CLINICAL DATA:  Shortness of breath with exertion, worsening recently. History of congestive heart failure. EXAM: CHEST - 2 VIEW COMPARISON:  10/06/2016. FINDINGS: Cardiomegaly. Aortic atherosclerosis. Pulmonary venous hypertension. Mild interstitial edema. Small effusions layering dependently. No acute bone finding. IMPRESSION: Congestive heart failure. Cardiomegaly. Pulmonary venous hypertension. Interstitial edema. Small effusions. Electronically Signed   By: Nelson Chimes M.D.   On: 01/16/2018 13:12     Assessment/Plan Principal Problem:   CHF (congestive heart failure) (HCC) Active Problems:   Essential  hypertension   CKD stage 4 due to type 2 diabetes mellitus ([place])   Diabetes mellitus with complication (HCC)   Hyponatremia  Acute on chronic CHF: Presents with worsening shortness of breath.  Has severe bilateral lower extreme edema with blister formation and ulcer on the left lower extremity.  Elevated BNP.  Chest x-ray was suggestive of  increased interstitial edema. We will start  him on Lasix 60 mg IV twice daily.  Cardiology will see him tomorrow. Echocardiogram on 11/16 showed ejection fraction of 55 to 60%, moderate left ventricular hypertrophy.  We will get a new echocardiogram. Monitor input/output.  Daily weight. Patient  has been advised to take less than 1500 mL of fluid a day and less than  2 g of salt a day at home.  Hyponatremia: Most likely secondary to hypervolemic hyponatremia secondary to CHF.  We will continue to monitor sodium level.  Severe bilateral lower extremity edema/venous stasis ulcer: Has ulceration on left lower extremity secondary to severe edema .Will request for wound care evaluation.  Will elevate the lower extremities.  Abdominal distention: Most likely secondary to CHF.  Will check ultrasound of the abdomen if there is significant amount of fluid in the peritoneum.  CKD stage IV: Currently kidney function on baseline.  We will continue to monitor.  Patient follows with nephrology.  Elevated troponin: Most likely secondary to supply and demand ischemia.  We will continue to cycle troponins.  Patient denies any chest pain.  Diabetes mellitus type 2: On insulin  at home.  We will continue sliding scale insulin here and continue to monitor his CBG  History of hypertension: Mildly hypertensive.  Will resume his home meds.  Debility/deconditioning: Patient family reports that he has difficulty with ambulation.  Will request for physical therapy evaluation..     Severity of Illntess: The appropriate patient status for this patient is INPATIENT.    DVT  prophylaxis: Heparin [place] Code Status:Full  Family Communication: Family members present at the bedside Consults called: Cardiology called by ED     Shelly Coss MD [facility] Pager [phone]  If 7PM-7AM, please contact night-coverage [email or web address] Password [email or web address]  01/16/2018, 4:22 PM

## 2018-01-16 NOTE — ED Notes (Signed)
ED TO INPATIENT HANDOFF REPORT  Name/Age/Gender Currie Dennin Langworthy 84 y.o. male  Code Status    Code Status Orders  (From admission, onward)        Start     Ordered   01/16/18 1614  Full code  Continuous     01/16/18 1615    Code Status History    Date Active Date Inactive Code Status Order ID Comments User Context   10/06/2016 1446 10/07/2016 1854 Full Code [id number]  Waldemar Dickens, MD Inpatient   05/05/2015 1140 05/09/2015 1834 Full Code [id number]  [name], MD Inpatient   08/22/2014 1438 08/23/2014 1354 Full Code [id number]  Ailene Rud, MD Inpatient   01/17/2014 1402 01/21/2014 1818 Full Code [id number]  Chriss Czar, PA-C Inpatient    Advance Directive Documentation     Most Recent Value  Type of Advance Directive  Healthcare Power of Attorney, Living will  Pre-existing out of facility DNR order (yellow form or pink MOST form)  -  "MOST" Form in Place?  -      Home/SNF/Other Home  Chief Complaint SOB  leg swelling   Level of Care/Admitting Diagnosis ED Disposition    ED Disposition Condition [place]: [place] [[id number]]  Level of Care: Telemetry [5]  Admit to tele based on following criteria: Acute CHF  Diagnosis: CHF (congestive heart failure) [facility]) [[id number]]  Admitting Physician: Shelly Coss [[id number]]  Attending Physician: Shelly Coss [[id number]]  Estimated length of stay: past midnight tomorrow  Certification:: I certify this patient will need inpatient services for at least 2 midnights  PT Class (Do Not Modify): Inpatient [101]  PT Acc Code (Do Not Modify): Private [1]       Medical History Past Medical History:  Diagnosis Date  . Bradycardia 10/06/2016  . CHF (congestive heart failure) ([place])   . CKD stage 4 due to type 2 diabetes mellitus ([place]) 06/03/2015  . Dyslipidemia associated with type 2 diabetes mellitus ([place]) 05/05/2015  . Essential hypertension 05/05/2015  . IDDM (insulin  dependent diabetes mellitus) ([place])   . SDAT (senile dementia of Alzheimer's type) 06/16/2013  . Uncontrolled insulin-dependent diabetes mellitus with renal manifestation ([place]) 05/05/2015    Allergies Allergies  Allergen Reactions  . Ace Inhibitors Other (See Comments) and Cough    Chronic cough  . Demerol Other (See Comments)    Hard to wake up  . Cosopt [Dorzolamide Hcl-Timolol Mal] Other (See Comments)    Cause heart rate to drop  . Flomax [Tamsulosin Hcl] Other (See Comments)    Low BP  . Morphine And Related Other (See Comments)    hallucinations  . Oxytrol [Oxybutynin] Rash    IV Location/Drains/Wounds Patient Lines/Drains/Airways Status   Active Line/Drains/Airways    Name:   Placement date:   Placement time:   Site:   Days:   Peripheral IV 01/16/18 Left Antecubital   01/16/18    1406    Antecubital   less than 1   External Urinary Catheter   05/05/15    1200    -   987   External Urinary Catheter   10/06/16    1500    -   467          Labs/Imaging Results for orders placed or performed during the hospital encounter of 01/16/18 (from the past 48 hour(s))  Troponin I     Status: Abnormal   Collection Time: 01/16/18  2:04 PM  Result  Value Ref Range   Troponin I 0.08 (HH) <0.03 ng/mL    Comment: CRITICAL RESULT CALLED TO, READ BACK BY AND VERIFIED WITH: INMAN,J. RN '@[id number]'$  ON 07.30.19 BY COHEN,K Performed at [facility], [place] [address], [place], [place] [address]   Comprehensive metabolic panel     Status: Abnormal   Collection Time: 01/16/18  2:04 PM  Result Value Ref Range   Sodium 126 (L) 135 - 145 mmol/L   Potassium 3.8 3.5 - 5.1 mmol/L   Chloride 96 (L) 98 - 111 mmol/L   CO2 24 22 - 32 mmol/L   Glucose, Bld 207 (H) 70 - 99 mg/dL   BUN 34 (H) 8 - 23 mg/dL   Creatinine, Ser 1.54 (H) 0.61 - 1.24 mg/dL   Calcium 8.6 (L) 8.9 - 10.3 mg/dL   Total Protein 7.3 6.5 - 8.1 g/dL   Albumin 3.9 3.5 - 5.0 g/dL   AST 29 15 - 41 U/L   ALT 27 0 - 44  U/L   Alkaline Phosphatase 56 38 - 126 U/L   Total Bilirubin 0.5 0.3 - 1.2 mg/dL   GFR calc non Af Amer 38 (L) >60 mL/min   GFR calc Af Amer 44 (L) >60 mL/min    Comment: (NOTE) The eGFR has been calculated using the CKD EPI equation. This calculation has not been validated in all clinical situations. eGFR's persistently <60 mL/min signify possible Chronic Kidney Disease.    Anion gap 6 5 - 15    Comment: Performed at [facility], [place] [address], [place], [place] [address]  CBC with Differential     Status: Abnormal   Collection Time: 01/16/18  2:04 PM  Result Value Ref Range   WBC 5.9 4.0 - 10.5 K/uL   RBC 3.97 (L) 4.22 - 5.81 MIL/uL   Hemoglobin 11.5 (L) 13.0 - 17.0 g/dL   HCT 34.0 (L) 39.0 - 52.0 %   MCV 85.6 78.0 - 100.0 fL   MCH 29.0 26.0 - 34.0 pg   MCHC 33.8 30.0 - 36.0 g/dL   RDW 13.4 11.5 - 15.5 %   Platelets 246 150 - 400 K/uL   Neutrophils Relative % 72 %   Neutro Abs 4.3 1.7 - 7.7 K/uL   Lymphocytes Relative 19 %   Lymphs Abs 1.1 0.7 - 4.0 K/uL   Monocytes Relative 8 %   Monocytes Absolute 0.5 0.1 - 1.0 K/uL   Eosinophils Relative 1 %   Eosinophils Absolute 0.0 0.0 - 0.7 K/uL   Basophils Relative 0 %   Basophils Absolute 0.0 0.0 - 0.1 K/uL    Comment: Performed at [facility], [place] [address] [place], [place] [address]  Protime-INR     Status: None   Collection Time: 01/16/18  2:04 PM  Result Value Ref Range   Prothrombin Time 13.6 11.4 - 15.2 seconds   INR 1.05     Comment: Performed at [facility], [place] [address] [place], [place] [address]  Brain natriuretic peptide     Status: Abnormal   Collection Time: 01/16/18  2:04 PM  Result Value Ref Range   B Natriuretic Peptide 552.9 (H) 0.0 - 100.0 pg/mL    Comment: Performed at [facility], [place] [address] [place], [place] [address]   Dg Chest 2 View  Result Date: 01/16/2018 CLINICAL DATA:  Shortness of breath with exertion,  worsening recently. History of congestive heart failure. EXAM: CHEST - 2 VIEW COMPARISON:  10/06/2016. FINDINGS: Cardiomegaly. Aortic atherosclerosis. Pulmonary venous hypertension. Mild interstitial edema. Small effusions layering dependently. No acute bone finding. IMPRESSION: Congestive heart failure. Cardiomegaly. Pulmonary venous hypertension. Interstitial edema. Small effusions. Electronically Signed   By: Nelson Chimes M.D.   On: 01/16/2018 13:12    Pending Labs Unresulted Labs (From admission, onward)   Start     Ordered   01/17/18 5366  Basic metabolic panel  Tomorrow morning,   R     01/16/18 1615   01/16/18 2000  Troponin I (q 6hr x 3)  Now then every 6 hours,   R     01/16/18 1616   01/16/18 1614  CBC  (heparin)  Once,   R    Comments:  Baseline for heparin therapy IF NOT ALREADY DRAWN.  Notify MD if PLT < 100 K.    01/16/18 1615   01/16/18 1614  Creatinine, serum  (heparin)  Once,   R    Comments:  Baseline for heparin therapy IF NOT ALREADY DRAWN.    01/16/18 1615      Vitals/Pain Today's Vitals   01/16/18 1234 01/16/18 1236 01/16/18 1329 01/16/18 1404  BP: (!) 177/74   (!) 169/77  Pulse: 66   62  Resp: 20   (!) 23  Temp: 98.4 F (36.9 C)     TempSrc: Oral     SpO2: 90%   98%  PainSc:  0-No pain 5      Isolation Precautions No active isolations  Medications Medications  furosemide (LASIX) injection 20 mg (has no administration in time range)  furosemide (LASIX) injection 60 mg (has no administration in time range)  aspirin tablet 81 mg (has no administration in time range)  carvedilol (COREG) tablet 6.25 mg (has no administration in time range)  dextromethorphan-guaiFENesin (MUCINEX DM) 30-600 MG per 12 hr tablet 1 tablet (has no administration in time range)  hydrALAZINE (APRESOLINE) tablet 100 mg (has no administration in time range)  mirabegron ER (MYRBETRIQ) tablet 50 mg (has no administration in time range)  senna-docusate (Senokot-S) tablet 1 tablet  (has no administration in time range)  heparin injection 5,000 Units (has no administration in time range)  insulin aspart (novoLOG) injection 0-15 Units (has no administration in time range)  insulin aspart (novoLOG) injection 0-5 Units (has no administration in time range)  furosemide (LASIX) injection 40 mg (40 mg Intravenous Given 01/16/18 1600)    Mobility walks with device

## 2018-01-16 NOTE — ED Provider Notes (Signed)
[facility] DEPT Provider Note   CSN: [id number] Arrival date & time: 01/16/18  1220     History   Chief Complaint Chief Complaint  Patient presents with  . Shortness of Breath  . Leg Swelling    HPI Gregory Hatfield is a 84 y.o. male.  84 year old male with prior history as detailed below presents with complaint of shortness of breath and lower dependen edema.  Patient was seen by his primary cardiology clinic this morning.  He was instructed to increase his home Lasix use.  After the visit he developed increasing shortness of breath especially with exertion and decided to come to the ED.  He denies associated chest pain.  He is comfortable at rest.  Room air sats on arrival were at 90%.  He does not have routine requirement for oxygen. He reports 15 lbs of weight gain over the last 4 days.   The history is provided by the patient and medical records.  Shortness of Breath  This is a recurrent problem. The average episode lasts 3 days. The problem occurs rarely.The current episode started more than 2 days ago. The problem has not changed since onset.Pertinent negatives include no fever, no headaches and no chest pain. He has tried nothing for the symptoms.    Past Medical History:  Diagnosis Date  . Bradycardia 10/06/2016  . CHF (congestive heart failure) ([place])   . CKD stage 4 due to type 2 diabetes mellitus ([place]) 06/03/2015  . Dyslipidemia associated with type 2 diabetes mellitus ([place]) 05/05/2015  . Essential hypertension 05/05/2015  . IDDM (insulin dependent diabetes mellitus) ([place])   . SDAT (senile dementia of Alzheimer's type) 06/16/2013  . Uncontrolled insulin-dependent diabetes mellitus with renal manifestation ([place]) 05/05/2015    Patient Active Problem List   Diagnosis Date Noted  . Symptomatic bradycardia 10/06/2016  . Diabetes mellitus with complication ([place]) 93/79/0240  . Chronic diastolic CHF (congestive heart failure) ([place])  10/06/2016  . Hyperlipidemia 01/12/2016  . Prostate cancer ([place]) 10/12/2015  . Diabetic retinopathy ([place]) 10/12/2015  . Encounter for Medicare annual wellness exam 10/12/2015  . Open-angle glaucoma 07/06/2015  . CKD stage 4 due to type 2 diabetes mellitus ([place]) 06/03/2015  . Vitamin D deficiency 06/03/2015  . Medication management 06/03/2015  . Insulin-requiring or dependent type II diabetes mellitus ([place]) 05/05/2015  . Essential hypertension 05/05/2015  . SDAT (senile dementia of Alzheimer's type) 06/16/2013    Past Surgical History:  Procedure Laterality Date  . CARDIAC CATHETERIZATION  04/2004   normal r-sided pressure, mild pulm HTN  . CARPAL TUNNEL RELEASE Right   . CERVICAL SPINE SURGERY  1990's  . LUMBAR SPINE SURGERY  2003  . PROSTATE SURGERY  10/2010   transurethral resection   . TOTAL HIP ARTHROPLASTY Left 01/17/2014   Procedure: LEFT TOTAL HIP ARTHROPLASTY;  Surgeon: Yvette Rack., MD;  Location: [facility];  Service: Orthopedics;  Laterality: Left;  . TRANSTHORACIC ECHOCARDIOGRAM  05/2006   EF 60-70%; mild calcif of MV; mild MV regurg; LA mildly dilated  . TRANSURETHRAL RESECTION OF PROSTATE N/A 08/22/2014   Procedure: TRANSURETHRAL RESECTION OF THE PROSTATE (TURP);  Surgeon: Marcia Brash, MD;  Location: [facility];  Service: Urology;  Laterality: N/A;        Home Medications    Prior to Admission medications   Medication Sig Start Date End Date Taking? Authorizing Provider  amLODipine (NORVASC) 5 MG tablet TAKE 1 TABLET ONCE DAILY. 10/30/17  Yes Hilty, Nadean Corwin, MD  aspirin 81 MG tablet Take 81 mg by mouth daily.   Yes [provider]  carvedilol (COREG) 6.25 MG tablet TAKE 1 TABLET BY MOUTH TWICE DAILY WITH A MEAL. 07/07/17  Yes [name], MD  Cholecalciferol (VITAMIN D3) 5000 UNITS CAPS Take 5,000 Units by mouth 2 (two) times daily.    Yes [provider]  dextromethorphan-guaiFENesin (MUCINEX DM) 30-600 MG 12hr tablet Take 1 tablet by mouth  2 (two) times daily.   Yes [provider]  furosemide (LASIX) 40 MG tablet TAKE 1 OR 2 TABLETS ONCE DAILY FOR BLOOD PRESSURE AND ANKLE SWELLING. 10/24/17  Yes [name], MD  hydrALAZINE (APRESOLINE) 100 MG tablet Take 1 tablet (100 mg total) by mouth 3 (three) times daily. KEEP OV. 12/27/17  Yes Meng, Hao, PA  LEVEMIR FLEXTOUCH 100 UNIT/ML Pen INJECT 50 UNITS IN THE AM AND 25 UNITS IN THE PM 01/13/18  Yes Renato Shin, MD  LUMIGAN 0.01 % SOLN Place 1 drop into the right eye at bedtime.  03/29/13  Yes [provider]  Multiple Vitamin (MULTIVITAMIN) tablet Take 1 tablet by mouth daily.     Yes [provider]  MYRBETRIQ 50 MG TB24 tablet Take 50 mg by mouth daily.  12/10/14  Yes [provider]  Probiotic Product (PROBIOTIC DAILY PO) Take 1 tablet by mouth daily.    Yes [provider]  senna-docusate (SENOKOT-S) 8.6-50 MG tablet Take 1 tablet by mouth at bedtime. Patient taking differently: Take 1 tablet by mouth as needed.  05/09/15  Yes Eugenie Filler, MD  SIMBRINZA 1-0.2 % SUSP Place 1 drop into both eyes 3 (three) times daily. 02/28/13  Yes [provider]  B-D UF III MINI PEN NEEDLES 31G X 5 MM MISC USE FOR INJECTIONS AS DIRECTED 12/27/17   Renato Shin, MD  ONE TOUCH ULTRA TEST test strip CHECK BLOOD SUGAR 6 TIMES A DAY. 02/12/17   Renato Shin, MD    Family History Family History  Problem Relation Age of Onset  . Diabetes Mother   . Hypertension Mother   . Prostate cancer Father   . Heart disease Father   . Diabetes Brother   . Cancer Brother   . Heart disease Brother   . Diabetes Sister   . Heart disease Sister   . Hyperlipidemia Child   . Hypertension Child   . Diabetes Child   . Heart attack Child   . Cancer Child     Social History Social History   Tobacco Use  . Smoking status: Former Smoker    Last attempt to quit: 06/21/1983    Years since quitting: 34.5  . Smokeless tobacco: Never Used  Substance Use  Topics  . Alcohol use: No  . Drug use: No     Allergies   Ace inhibitors; Demerol; Cosopt [dorzolamide hcl-timolol mal]; Flomax [tamsulosin hcl]; Morphine and related; and Oxytrol [oxybutynin]   Review of Systems Review of Systems  Constitutional: Negative for fever.  Respiratory: Positive for shortness of breath.   Cardiovascular: Negative for chest pain.  Neurological: Negative for headaches.  All other systems reviewed and are negative.    Physical Exam Updated Vital Signs BP (!) 169/77   Pulse 62   Temp 98.4 F (36.9 C) (Oral)   Resp (!) 23   SpO2 98%   Physical Exam  Constitutional: He is oriented to person, place, and time. He appears well-developed and well-nourished. No distress.  HENT:  Head: Normocephalic and atraumatic.  Mouth/Throat: Oropharynx  is clear and moist.  Eyes: Pupils are equal, round, and reactive to light. Conjunctivae and EOM are normal.  Neck: Normal range of motion. Neck supple.  Cardiovascular: Normal rate, regular rhythm and normal heart sounds.  2-3+ bilateral lower extremity edema  Pulmonary/Chest: Effort normal. No respiratory distress.  Decreased breath sounds at bilateral bases  Abdominal: Soft. He exhibits no distension. There is no tenderness.  Musculoskeletal: Normal range of motion. He exhibits no edema or deformity.  Neurological: He is alert and oriented to person, place, and time.  Skin: Skin is warm and dry.  Psychiatric: He has a normal mood and affect.  Nursing note and vitals reviewed.    ED Treatments / Results  Labs (all labs ordered are listed, but only abnormal results are displayed) Labs Reviewed  TROPONIN I - Abnormal; Notable for the following components:      Result Value   Troponin I 0.08 (*)    All other components within normal limits  COMPREHENSIVE METABOLIC PANEL - Abnormal; Notable for the following components:   Sodium 126 (*)    Chloride 96 (*)    Glucose, Bld 207 (*)    BUN 34 (*)     Creatinine, Ser 1.54 (*)    Calcium 8.6 (*)    GFR calc non Af Amer 38 (*)    GFR calc Af Amer 44 (*)    All other components within normal limits  CBC WITH DIFFERENTIAL/PLATELET - Abnormal; Notable for the following components:   RBC 3.97 (*)    Hemoglobin 11.5 (*)    HCT 34.0 (*)    All other components within normal limits  BRAIN NATRIURETIC PEPTIDE - Abnormal; Notable for the following components:   B Natriuretic Peptide 552.9 (*)    All other components within normal limits  PROTIME-INR    EKG EKG Interpretation  Date/Time:  Tuesday January 16 2018 12:31:50 EDT Ventricular Rate:  65 PR Interval:    QRS Duration: 141 QT Interval:  444 QTC Calculation: 462 R Axis:   -71 Text Interpretation:  Atrial fibrillation Ventricular premature complex Left bundle branch block Baseline wander in lead(s) III Confirmed by Dene Gentry [phone]) on 01/16/2018 1:09:49 PM   Radiology Dg Chest 2 View  Result Date: 01/16/2018 CLINICAL DATA:  Shortness of breath with exertion, worsening recently. History of congestive heart failure. EXAM: CHEST - 2 VIEW COMPARISON:  10/06/2016. FINDINGS: Cardiomegaly. Aortic atherosclerosis. Pulmonary venous hypertension. Mild interstitial edema. Small effusions layering dependently. No acute bone finding. IMPRESSION: Congestive heart failure. Cardiomegaly. Pulmonary venous hypertension. Interstitial edema. Small effusions. Electronically Signed   By: Nelson Chimes M.D.   On: 01/16/2018 13:12    Procedures Procedures (including critical care time)  Medications Ordered in ED Medications  furosemide (LASIX) injection 40 mg (has no administration in time range)     Initial Impression / Assessment and Plan / ED Course  I have reviewed the triage vital signs and the nursing notes.  Pertinent labs & imaging results that were available during my care of the patient were reviewed by me and considered in my medical decision making (see chart for details).      MDM  Screen complete  Patient is presenting for evaluation of increasing shortness of breath.  Patient's symptoms are most suggestive of likely CHF exacerbation.  Patient will require admission for IV diuresis.   Hospitalist service is aware of case and will evaluate for admission.  Final Clinical Impressions(s) / ED Diagnoses   Final diagnoses:  Shortness  of breath  Peripheral edema  Hyponatremia    ED Discharge Orders    None       Valarie Merino, MD 01/16/18 [phone]

## 2018-01-17 ENCOUNTER — Inpatient Hospital Stay (HOSPITAL_COMMUNITY): Payer: Medicare Other

## 2018-01-17 ENCOUNTER — Encounter (HOSPITAL_COMMUNITY): Payer: Self-pay | Admitting: Radiology

## 2018-01-17 DIAGNOSIS — I10 Essential (primary) hypertension: Secondary | ICD-10-CM

## 2018-01-17 DIAGNOSIS — R609 Edema, unspecified: Secondary | ICD-10-CM

## 2018-01-17 DIAGNOSIS — E871 Hypo-osmolality and hyponatremia: Secondary | ICD-10-CM

## 2018-01-17 DIAGNOSIS — I5033 Acute on chronic diastolic (congestive) heart failure: Secondary | ICD-10-CM

## 2018-01-17 DIAGNOSIS — R0602 Shortness of breath: Secondary | ICD-10-CM

## 2018-01-17 DIAGNOSIS — E1122 Type 2 diabetes mellitus with diabetic chronic kidney disease: Secondary | ICD-10-CM

## 2018-01-17 DIAGNOSIS — N184 Chronic kidney disease, stage 4 (severe): Secondary | ICD-10-CM

## 2018-01-17 DIAGNOSIS — I5030 Unspecified diastolic (congestive) heart failure: Secondary | ICD-10-CM

## 2018-01-17 LAB — GLUCOSE, CAPILLARY
GLUCOSE-CAPILLARY: 177 mg/dL — AB (ref 70–99)
GLUCOSE-CAPILLARY: 191 mg/dL — AB (ref 70–99)
Glucose-Capillary: 145 mg/dL — ABNORMAL HIGH (ref 70–99)
Glucose-Capillary: 188 mg/dL — ABNORMAL HIGH (ref 70–99)

## 2018-01-17 LAB — BASIC METABOLIC PANEL
ANION GAP: 10 (ref 5–15)
BUN: 38 mg/dL — ABNORMAL HIGH (ref 8–23)
CALCIUM: 8.6 mg/dL — AB (ref 8.9–10.3)
CO2: 24 mmol/L (ref 22–32)
Chloride: 94 mmol/L — ABNORMAL LOW (ref 98–111)
Creatinine, Ser: 1.67 mg/dL — ABNORMAL HIGH (ref 0.61–1.24)
GFR, EST AFRICAN AMERICAN: 40 mL/min — AB (ref >60)
GFR, EST NON AFRICAN AMERICAN: 34 mL/min — AB (ref >60)
Glucose, Bld: 180 mg/dL — ABNORMAL HIGH (ref 70–99)
POTASSIUM: 3.3 mmol/L — AB (ref 3.5–5.1)
SODIUM: 128 mmol/L — AB (ref 135–145)

## 2018-01-17 LAB — CBC WITH DIFFERENTIAL/PLATELET
BASOS PCT: 0.5 %
Basophils Absolute: 31 cells/uL (ref 0–200)
Eosinophils Absolute: 43 cells/uL (ref 15–500)
Eosinophils Relative: 0.7 %
HCT: 31.5 % — ABNORMAL LOW (ref 38.5–50.0)
HEMOGLOBIN: 11.4 g/dL — AB (ref 13.2–17.1)
Lymphs Abs: 921 cells/uL (ref 850–3900)
MCH: 30.3 pg (ref 27.0–33.0)
MCHC: 36.2 g/dL — ABNORMAL HIGH (ref 32.0–36.0)
MCV: 83.8 fL (ref 80.0–100.0)
MPV: 12.1 fL (ref 7.5–12.5)
Monocytes Relative: 11.8 %
NEUTROS ABS: 4386 {cells}/uL (ref 1500–7800)
Neutrophils Relative %: 71.9 %
PLATELETS: 211 10*3/uL (ref 140–400)
RBC: 3.76 10*6/uL — AB (ref 4.20–5.80)
RDW: 12.5 % (ref 11.0–15.0)
TOTAL LYMPHOCYTE: 15.1 %
WBC: 6.1 10*3/uL (ref 3.8–10.8)
WBCMIX: 720 {cells}/uL (ref 200–950)

## 2018-01-17 LAB — URINALYSIS, ROUTINE W REFLEX MICROSCOPIC
Bacteria, UA: NONE SEEN /HPF
Bilirubin Urine: NEGATIVE
HGB URINE DIPSTICK: NEGATIVE
Hyaline Cast: NONE SEEN /LPF
KETONES UR: NEGATIVE
LEUKOCYTES UA: NEGATIVE
NITRITE: NEGATIVE
PH: 5.5 (ref 5.0–8.0)
RBC / HPF: NONE SEEN /HPF (ref 0–2)
SPECIFIC GRAVITY, URINE: 1.018 (ref 1.001–1.03)
WBC UA: NONE SEEN /HPF (ref 0–5)

## 2018-01-17 LAB — COMPLETE METABOLIC PANEL WITH GFR
AG RATIO: 1.7 (calc) (ref 1.0–2.5)
ALT: 20 U/L (ref 9–46)
AST: 27 U/L (ref 10–35)
Albumin: 4 g/dL (ref 3.6–5.1)
Alkaline phosphatase (APISO): 58 U/L (ref 40–115)
BUN/Creatinine Ratio: 19 (calc) (ref 6–22)
BUN: 33 mg/dL — AB (ref 7–25)
CALCIUM: 8.6 mg/dL (ref 8.6–10.3)
CO2: 21 mmol/L (ref 20–32)
Chloride: 91 mmol/L — ABNORMAL LOW (ref 98–110)
Creat: 1.71 mg/dL — ABNORMAL HIGH (ref 0.70–1.11)
GFR, EST AFRICAN AMERICAN: 40 mL/min/{1.73_m2} — AB (ref >=60)
GFR, EST NON AFRICAN AMERICAN: 34 mL/min/{1.73_m2} — AB (ref >=60)
GLUCOSE: 243 mg/dL — AB (ref 65–99)
Globulin: 2.4 g/dL (calc) (ref 1.9–3.7)
Potassium: 3.9 mmol/L (ref 3.5–5.3)
Sodium: 124 mmol/L — ABNORMAL LOW (ref 135–146)
TOTAL PROTEIN: 6.4 g/dL (ref 6.1–8.1)
Total Bilirubin: 0.5 mg/dL (ref 0.2–1.2)

## 2018-01-17 LAB — TROPONIN I
TROPONIN I: 0.09 ng/mL — AB (ref <0.03)
TROPONIN I: 0.09 ng/mL — AB (ref <0.03)

## 2018-01-17 LAB — BRAIN NATRIURETIC PEPTIDE: Brain Natriuretic Peptide: 599 pg/mL — ABNORMAL HIGH (ref <100)

## 2018-01-17 LAB — ECHOCARDIOGRAM COMPLETE
Height: 71 in
Weight: 3740.8 oz

## 2018-01-17 MED ORDER — AMLODIPINE BESYLATE 5 MG PO TABS
5.0000 mg | ORAL_TABLET | Freq: Every day | ORAL | Status: DC
  Administered 2018-01-17 – 2018-01-18 (×2): 5 mg via ORAL
  Filled 2018-01-17 (×2): qty 1

## 2018-01-17 MED ORDER — POLYETHYLENE GLYCOL 3350 17 G PO PACK
17.0000 g | PACK | Freq: Every day | ORAL | Status: DC
  Administered 2018-01-17 – 2018-01-19 (×3): 17 g via ORAL
  Filled 2018-01-17 (×4): qty 1

## 2018-01-17 MED ORDER — POTASSIUM CHLORIDE CRYS ER 20 MEQ PO TBCR
40.0000 meq | EXTENDED_RELEASE_TABLET | Freq: Once | ORAL | Status: AC
  Administered 2018-01-17: 40 meq via ORAL
  Filled 2018-01-17: qty 2

## 2018-01-17 MED ORDER — FUROSEMIDE 10 MG/ML IJ SOLN
80.0000 mg | Freq: Three times a day (TID) | INTRAMUSCULAR | Status: DC
  Administered 2018-01-17 – 2018-01-19 (×5): 80 mg via INTRAVENOUS
  Filled 2018-01-17 (×5): qty 8

## 2018-01-17 MED ORDER — POTASSIUM CHLORIDE CRYS ER 20 MEQ PO TBCR
20.0000 meq | EXTENDED_RELEASE_TABLET | Freq: Two times a day (BID) | ORAL | Status: DC
  Administered 2018-01-17 – 2018-01-20 (×6): 20 meq via ORAL
  Filled 2018-01-17 (×6): qty 1

## 2018-01-17 NOTE — Consult Note (Signed)
[facility] Nurse wound consult note Reason for Consult: lower extremity wound Patient reports his wounds and venous stasis disease is managed with Unna's boots. He reports he had them on when he was admitted.  Wound type:venous stasis ulcerations LLE pretibial and posterior calf. Scattered areas on the RLE but not actively draining  Pressure Injury POA: NA Measurement: LLE pretibial 2cm x 4cm x 0.2cm Scattered small ulcerations over the posterior calf  Wound bed: pretibial-ruddy, moist; posterior calf 100% clean Drainage (amount, consistency, odor) moderate, serosanguinous  Periwound:2+ pitting edema bilateral and palpable distal pulses Dressing procedure/placement/frequency: Silver hydrofiber to the pretibial wound and posterior calf (affected areas), top with foam. Per bedside nurse prior to application of Unna's boots bilaterally per orthopedic tech. Change weekly on Wednesday while inpatient  Continue with [facility] for compression wraps and topical wound care at home.  Discussed POC with patient and bedside nurse.  Re consult if needed, will not follow at this time. Thanks  Cache Decoursey [employer], RN,CWOCN, CNS, [place] [phone])

## 2018-01-17 NOTE — Progress Notes (Signed)
  Echocardiogram 2D Echocardiogram has been performed.  Gregory Hatfield 01/17/2018, 4:46 PM

## 2018-01-17 NOTE — Consult Note (Addendum)
Cardiology Consult    Patient ID: GRAYSIN Hatfield MRN: [id number], DOB/AGE: [birth year]   Admit date: 01/16/2018 Date of Consult: 01/17/2018  Primary Physician: [name], MD Primary Cardiologist: Dr. Debara Pickett Requesting Provider: Dr. Tawanna Solo Reason for Consultation: CHF  Gregory Hatfield is a 84 y.o. male who is being seen today for the evaluation of CHF at the request of Dr. Tawanna Solo.  Patient Profile    84 yo male with PMH of HTN, CKD, IDDM, HL and bradycardia who presented to with worsening shortness of breath with acute on chronic HF.  Past Medical History   Past Medical History:  Diagnosis Date  . Bradycardia 10/06/2016  . CHF (congestive heart failure) ([place])   . CKD stage 4 due to type 2 diabetes mellitus ([place]) 06/03/2015  . Dyslipidemia associated with type 2 diabetes mellitus ([place]) 05/05/2015  . Essential hypertension 05/05/2015  . IDDM (insulin dependent diabetes mellitus) ([place])   . SDAT (senile dementia of Alzheimer's type) 06/16/2013  . Uncontrolled insulin-dependent diabetes mellitus with renal manifestation ([place]) 05/05/2015    Past Surgical History:  Procedure Laterality Date  . CARDIAC CATHETERIZATION  04/2004   normal r-sided pressure, mild pulm HTN  . CARPAL TUNNEL RELEASE Right   . CERVICAL SPINE SURGERY  1990's  . LUMBAR SPINE SURGERY  2003  . PROSTATE SURGERY  10/2010   transurethral resection   . TOTAL HIP ARTHROPLASTY Left 01/17/2014   Procedure: LEFT TOTAL HIP ARTHROPLASTY;  Surgeon: Yvette Rack., MD;  Location: [facility];  Service: Orthopedics;  Laterality: Left;  . TRANSTHORACIC ECHOCARDIOGRAM  05/2006   EF 60-70%; mild calcif of MV; mild MV regurg; LA mildly dilated  . TRANSURETHRAL RESECTION OF PROSTATE N/A 08/22/2014   Procedure: TRANSURETHRAL RESECTION OF THE PROSTATE (TURP);  Surgeon: Marcia Brash, MD;  Location: [facility];  Service: Urology;  Laterality: N/A;     Allergies  Allergies  Allergen Reactions  . Ace Inhibitors Other  (See Comments) and Cough    Chronic cough  . Demerol Other (See Comments)    Hard to wake up  . Cosopt [Dorzolamide Hcl-Timolol Mal] Other (See Comments)    Cause heart rate to drop  . Flomax [Tamsulosin Hcl] Other (See Comments)    Low BP  . Morphine And Related Other (See Comments)    hallucinations  . Oxytrol [Oxybutynin] Rash    History of Present Illness    Mr. [name] is a 84 y/o male with PMH of HTN, CKD, IDDM, HL and bradycardia. He had a cardiac cath back in 2005 that was normal. Hospitalized back in 5/18 with profound bradycardia with rates into the 30s. He was on coreg and diltiazem prior to arrival and given calcium gluconate in the ED and his junctional rhythm later improved to SR.  He developed acute on chronic renal failure but did return to baseline. He was restarted on Dilt 120mg  and coreg 6.25mg  BID. He was seen at a follow up visit in the office and Dilt was switched to amlodipine.   He last saw Almyra Deforest PA in the office on 12/27/17 and reported doing well. Blood pressure was noted to be evaluated and his hydralazine was increased to 100mg  TID.   He presented to the [facility] on 01/16/18 with increased shortness of breath. He reported worsening bilateral LE edema, exertional shortness of breath, orthopnea and wheezing.  The symptoms became much worse in the last two weeks.  He developed blistering and weeping LE edema.  Presented to his PCPs  office He prior to admission and was instructed to increase furosemide.  However he became so dyspneic walking out of the office that he had to come to the emergency department instead.  He denies any chest pain or pressure.  He also denies palpitations, lightheadedness, or dizziness.  Inpatient Medications    . aspirin EC  81 mg Oral Daily  . carvedilol  6.25 mg Oral BID WC  . dextromethorphan-guaiFENesin  1 tablet Oral BID  . diclofenac sodium  2 g Topical QID  . furosemide  60 mg Intravenous BID  . heparin  5,000 Units Subcutaneous Q8H    . hydrALAZINE  100 mg Oral TID  . insulin aspart  0-15 Units Subcutaneous TID WC  . insulin aspart  0-5 Units Subcutaneous QHS  . mirabegron ER  50 mg Oral Daily  . polyethylene glycol  17 g Oral Daily  . senna-docusate  1 tablet Oral QHS    Family History    Family History  Problem Relation Age of Onset  . Diabetes Mother   . Hypertension Mother   . Prostate cancer Father   . Heart disease Father   . Diabetes Brother   . Cancer Brother   . Heart disease Brother   . Diabetes Sister   . Heart disease Sister   . Hyperlipidemia Child   . Hypertension Child   . Diabetes Child   . Heart attack Child   . Cancer Child     Social History    Social History   Socioeconomic History  . Marital status: Widowed    Spouse name: Not on file  . Number of children: 5  . Years of education: master's  . Highest education level: Not on file  Occupational History  . Occupation: Therapist, music: RETIRED  Social Needs  . Financial resource strain: Not on file  . Food insecurity:    Worry: Not on file    Inability: Not on file  . Transportation needs:    Medical: Not on file    Non-medical: Not on file  Tobacco Use  . Smoking status: Former Smoker    Last attempt to quit: 06/21/1983    Years since quitting: 34.6  . Smokeless tobacco: Never Used  Substance and Sexual Activity  . Alcohol use: No  . Drug use: No  . Sexual activity: Never  Lifestyle  . Physical activity:    Days per week: Not on file    Minutes per session: Not on file  . Stress: Not on file  Relationships  . Social connections:    Talks on phone: Not on file    Gets together: Not on file    Attends religious service: Not on file    Active member of club or organization: Not on file    Attends meetings of clubs or organizations: Not on file    Relationship status: Not on file  . Intimate partner violence:    Fear of current or ex partner: Not on file    Emotionally abused: Not on file     Physically abused: Not on file    Forced sexual activity: Not on file  Other Topics Concern  . Not on file  Social History Narrative  . Not on file     Review of Systems    See HPI  All other systems reviewed and are otherwise negative except as noted above.  Physical Exam   VS:  BP (!) 145/59 (BP Location: Right Arm)  Pulse 60   Temp 97.9 F (36.6 C)   Resp 18   Ht 5\' 11"  (1.803 m)   Wt 233 lb 12.8 oz (106.1 kg)   SpO2 99%   BMI 32.61 kg/m  , BMI Body mass index is 32.61 kg/m. GENERAL:  Well appearing. Appears younger than stated age. HEENT: Pupils equal round and reactive, fundi not visualized, oral mucosa unremarkable NECK:  + jugular venous distention to upper neck sitting upright.   waveform within normal limits, carotid upstroke brisk and symmetric, no bruits, no thyromegaly LYMPHATICS:  No cervical adenopathy LUNGS:  Clear to auscultation bilaterally HEART:  RRR.  PMI not displaced or sustained,S1 and S2 within normal limits, no S3, no S4, no clicks, no rubs, no murmurs ABD:  Flat, positive bowel sounds normal in frequency in pitch, no bruits, no rebound, no guarding, no midline pulsatile mass, no hepatomegaly, no splenomegaly EXT:  2 plus pulses throughout, 2+ LE edema, no cyanosis no clubbing SKIN:  No rashes no nodules.  2cm x 2cm ulceration on the L anterior tibia  NEURO:  Cranial nerves II through XII grossly intact, motor grossly intact throughout PSYCH:  Cognitively intact, oriented to person place and time   Labs    Troponin (Point of Care Test) No results for input(s): TROPIPOC in the last 72 hours. Recent Labs    01/16/18 1404 01/16/18 2003 01/17/18 0151 01/17/18 0807  TROPONINI 0.08* 0.08* 0.09* 0.09*   Lab Results  Component Value Date   WBC 5.9 01/16/2018   HGB 11.5 (L) 01/16/2018   HCT 34.0 (L) 01/16/2018   MCV 85.6 01/16/2018   PLT 246 01/16/2018    Recent Labs  Lab 01/16/18 1404 01/17/18 0151  NA 126* 128*  K 3.8 3.3*  CL 96*  94*  CO2 24 24  BUN 34* 38*  CREATININE 1.54* 1.67*  CALCIUM 8.6* 8.6*  PROT 7.3  --   BILITOT 0.5  --   ALKPHOS 56  --   ALT 27  --   AST 29  --   GLUCOSE 207* 180*   Lab Results  Component Value Date   CHOL 175 12/20/2017   HDL 41 12/20/2017   LDLCALC 109 (H) 12/20/2017   TRIG 137 12/20/2017   No results found for: [facility]   Radiology Studies    Ct Abdomen Pelvis Wo Contrast  Result Date: 01/17/2018 CLINICAL DATA:  Abdominal distension over the last year. Worsening lower extremity edema. EXAM: CT ABDOMEN AND PELVIS WITHOUT CONTRAST TECHNIQUE: Multidetector CT imaging of the abdomen and pelvis was performed following the standard protocol without IV contrast. COMPARISON:  Ultrasound same day FINDINGS: Lower chest: Bilateral pleural effusions layering dependently with dependent pulmonary atelectasis. Mild cardiomegaly. No visible pericardial fluid. Hepatobiliary: Nonspecific 12 mm low-density in the right lobe of the liver image 17. Otherwise no abnormal liver finding. No calcified gallstones. Pancreas: Normal Spleen: Normal Adrenals/Urinary Tract: Right adrenal gland is normal. 2.3 cm left adrenal mass with density is low OS 15 Hounsfield units. Second smaller mass in the left adrenal gland measuring 11 mm of similar density. Kidneys appear normal without contrast. No bladder abnormality is seen. Stomach/Bowel: Fairly large amount of gas within the intestine but without an obstructive pattern. No focal bowel lesion is seen. There is a periumbilical ventral hernia containing only fat. Vascular/Lymphatic: Aortic atherosclerosis. No aneurysm. IVC is normal. No retroperitoneal adenopathy. Reproductive: Normal Other: No free fluid or air. Musculoskeletal: Chronic postsurgical and degenerative changes of the spine. IMPRESSION: No ascites. Large  amount of intra-abdominal fat. Moderate amount of gas in the intestine without obstructive pattern. Small periumbilical ventral hernia containing only fat.  Two adrenal masses on the left, the larger measuring 2.3 cm in diameter with density as low as 15 Hounsfield units. These are likely to represent benign adenomas. Aortic atherosclerosis. Electronically Signed   By: Nelson Chimes M.D.   On: 01/17/2018 11:40   Dg Chest 2 View  Result Date: 01/16/2018 CLINICAL DATA:  Shortness of breath with exertion, worsening recently. History of congestive heart failure. EXAM: CHEST - 2 VIEW COMPARISON:  10/06/2016. FINDINGS: Cardiomegaly. Aortic atherosclerosis. Pulmonary venous hypertension. Mild interstitial edema. Small effusions layering dependently. No acute bone finding. IMPRESSION: Congestive heart failure. Cardiomegaly. Pulmonary venous hypertension. Interstitial edema. Small effusions. Electronically Signed   By: Nelson Chimes M.D.   On: 01/16/2018 13:12   US Abdomen Limited  Result Date: 01/17/2018 CLINICAL DATA:  Evaluate for ascites.  Distended abdomen EXAM: LIMITED ABDOMEN ULTRASOUND FOR ASCITES TECHNIQUE: Limited ultrasound survey for ascites was performed in all four abdominal quadrants. COMPARISON:  None. FINDINGS: Surveillance images show no ascites. No incidental fluid dilated bowel or gross mass lesion. IMPRESSION: Negative for ascites. Electronically Signed   By: Monte Fantasia M.D.   On: 01/17/2018 07:27    ECG & Cardiac Imaging    EKG:  The EKG was personally reviewed and demonstrates SR with known LBBB and PACs  Telemetry: Sinus bradycardia.  SInus arrhythmia.  Rate 50-60  Echo: 05/05/18  Study Conclusions  - Left ventricle: The cavity size was normal. Wall thickness was   increased in a pattern of moderate LVH. Systolic function was   normal. The estimated ejection fraction was in the range of 55%   to 60%. Wall motion was normal; there were no regional wall   motion abnormalities.  Assessment & Plan    84 yo male with PMH of HTN, CKD, IDDM, HL and bradycardia who presented to with worsening shortness of breath with acute on  chronic HF.  # Acute on chronic  diastolic heart failure: Mr. Febles is massively volume overloaded.  He was given Lasix 20 mg followed by 40 mg followed by 60 mg this morning.  He is net -1 L.  We will increase this to 80 mg 3 times daily.  Continue to monitor renal function and sodium closely.  His hyponatremia is related to hypervolemic hyponatremia and should improve with diuresis.  We discussed the importance of limiting his fluid intake.  He has been drinking nearly a gallon of water daily because he thinks this will help with his blood sugars.  We discussed the importance of limiting salt and fluid intake.  He already weighs himself daily but did not call his provider when he noted his weight increasing.  Echo pending.  Continue carvedilol and hydralazine.  He isn't on an ARB due to   # Bradycardia: He is asymptomatic.  We will not titrate carvedilol.  Unless LVEF is reduced on echo, he doen't have an indication for beta blocker and this can be stopped as needed.  # Hypertension: BP poorly controlled.  Diuresis as above and add back home amlodipine.   Signed, Kycen Spalla C. Oval Linsey, MD, [facility]  01/17/2018 4:04 PM

## 2018-01-17 NOTE — Progress Notes (Signed)
CRITICAL VALUE ALERT  Critical Value:  Troponin 0.09  Date & Time Notied: RN saw result  Provider Notified: X.Blount, NP  Orders Received/Actions taken: continue to monitor

## 2018-01-17 NOTE — Evaluation (Signed)
Physical Therapy Evaluation Patient Details Name: Gregory Hatfield MRN: [id number] DOB: 03-Mar-1926 Today's Date: 01/17/2018   History of Present Illness  84 y.o. male with medical history significant of diastolic CHF, CKD stage IV, hypertension, DMII, SDAT. Admitted 7/30 for CHF.   Clinical Impression   Pt is a 84 YO male admitted 7/30 for CHF, notably swelling in bilat LEs and shortness of breath. Pt presents with decreased endurance for ambulation and reports dyspnea with exertion. Pt removed from supplemental O2 during session and maintained sats at 89%-96% on room air. Pt also presents with poor dynamic balance. Pt would benefit from acute PT to address these deficits and work towards recommendations of returning home with granddaughter. Will continue to follow acutely.     Follow Up Recommendations Home health PT    Equipment Recommendations  None recommended by PT    Recommendations for Other Services       Precautions / Restrictions Precautions Precautions: Fall Precaution Comments: Monitor Sats throughout session  Restrictions Weight Bearing Restrictions: No      Mobility  Bed Mobility Overal bed mobility: Needs Assistance Bed Mobility: Supine to Sit     Supine to sit: Min guard     General bed mobility comments: cuing for scooting to EOB   Transfers Overall transfer level: Needs assistance Equipment used: Rolling walker (2 wheeled) Transfers: Sit to/from Stand Sit to Stand: Min assist         General transfer comment: sit to stand x2, min assist for trunk extension and power up. Pt leaned posteriorly after standing and required steadying   Ambulation/Gait Ambulation/Gait assistance: Min assist Gait Distance (Feet): 200 Feet Assistive device: Rolling walker (2 wheeled) Gait Pattern/deviations: Step-through pattern;Decreased stride length;Trunk flexed;Drifts right/left Gait velocity: decreased    General Gait Details: Min assist required for steadying.  mod verbal cuing for stepping into RW   Stairs            Wheelchair Mobility    Modified Rankin (Stroke Patients Only)       Balance Overall balance assessment: Needs assistance;History of Falls Sitting-balance support: No upper extremity supported;Feet supported Sitting balance-Leahy Scale: Fair     Standing balance support: Bilateral upper extremity supported;During functional activity Standing balance-Leahy Scale: Poor Standing balance comment: requires steadying 3 times during standing and ambulation                              Pertinent Vitals/Pain Pain Assessment: No/denies pain    Home Living Family/patient expects to be discharged to:: Private residence Living Arrangements: Other relatives(granddaughter ) Available Help at Discharge: Family;Available 24 hours/day   Home Access: Stairs to enter Entrance Stairs-Rails: Can reach both Entrance Stairs-Number of Steps: 5 Home Layout: One level Home Equipment: Walker - 2 wheels;Cane - single point;Grab bars - tub/shower;Wheelchair - manual;Bedside commode      Prior Function Level of Independence: Needs assistance   Gait / Transfers Assistance Needed: ambulates with AD   ADL's / Homemaking Assistance Needed: Granddaughter assists with cooking, cleaning, other ADLs         Hand Dominance        Extremity/Trunk Assessment   Upper Extremity Assessment Upper Extremity Assessment: Generalized weakness    Lower Extremity Assessment Lower Extremity Assessment: Generalized weakness(pt reports LE edema, blistering, and neuropathy )       Communication   Communication: HOH  Cognition Arousal/Alertness: Awake/alert Behavior During Therapy: WFL for tasks assessed/performed Overall Cognitive  Status: No family/caregiver present to determine baseline cognitive functioning                                 General Comments: hx of dementia, pt exhibited some confusion during  conversation during eval       General Comments      Exercises     Assessment/Plan    PT Assessment Patient needs continued PT services  PT Problem List Decreased strength;Decreased mobility;Decreased safety awareness;Decreased activity tolerance;Decreased balance;Decreased knowledge of use of DME       PT Treatment Interventions DME instruction;Therapeutic activities;Gait training;Stair training;Functional mobility training;Patient/family education;Balance training    PT Goals (Current goals can be found in the Care Plan section)       Frequency Min 3X/week   Barriers to discharge        Co-evaluation               AM-PAC PT "6 Clicks" Daily Activity  Outcome Measure Difficulty turning over in bed (including adjusting bedclothes, sheets and blankets)?: A Little Difficulty moving from lying on back to sitting on the side of the bed? : Unable Difficulty sitting down on and standing up from a chair with arms (e.g., wheelchair, bedside commode, etc,.)?: Unable Help needed moving to and from a bed to chair (including a wheelchair)?: A Little Help needed walking in hospital room?: A Little Help needed climbing 3-5 steps with a railing? : A Lot 6 Click Score: 13    End of Session Equipment Utilized During Treatment: Gait belt Activity Tolerance: Patient tolerated treatment well;Patient limited by fatigue Patient left: in chair;with chair alarm set;with call bell/phone within reach;with nursing/sitter in room   PT Visit Diagnosis: Unsteadiness on feet (R26.81);Other abnormalities of gait and mobility (R26.89)    Time: 3794-3276 PT Time Calculation (min) (ACUTE ONLY): 36 min   Charges:   PT Evaluation $PT Eval Low Complexity: 1 Low PT Treatments $Gait Training: 8-22 mins       Garrison Michie Conception Chancy, PT, DPT  Pager # [phone]   Teofil Maniaci D Amantha Sklar 01/17/2018, 12:31 PM

## 2018-01-17 NOTE — Progress Notes (Signed)
PROGRESS NOTE    [name]  NAT:[id number] DOB: 1925/10/24 DOA: 01/16/2018 PCP: [name], MD   Brief Narrative: Gregory Hatfield is a 84 y.o. male with medical history significant of diastolic CHF, CKD stage IV, hypertension, hyperlipidemia who presents from home for evaluation of increased shortness of breath, worsening lower extremity edema.  He is on Lasix at home.  Patient and family reports that his bilateral lower extremity edema has been worsening and he has been having increased shortness of breath on minimal exertion.  Also reported of cough. Patient was seen at his cardiologist's office.  He was recommended to take 60 mg of Lasix twice a day and was sent home.  On the way back to home he became very short of breath and had to be brought to the emergency department.  Family reported that there was improvement of his weight by 15 pounds in last 4 days.  Patient takes his daily weight.  As per the daughter he has been having increase water intake recently.  He takes 2 glasses of water during his each meal. When patient arrived in the emergency department, his oxygen saturation was 90%. Patient admitted for the management of CHF exacerbation.  Assessment & Plan:   Principal Problem:   CHF (congestive heart failure) (HCC) Active Problems:   Essential hypertension   CKD stage 4 due to type 2 diabetes mellitus ([place])   Diabetes mellitus with complication (HCC)   Hyponatremia   Acute on chronic CHF: Presents with worsening shortness of breath.  Has severe bilateral lower extreme edema with blister formation and ulcer on the left lower extremity.  Elevated BNP.  Chest x-ray was suggestive of  increased interstitial edema. Started him on Lasix 60 mg IV twice daily.  Cardiology will follow him. Echocardiogram on 11/16 showed ejection fraction of 55 to 60%, moderate left ventricular hypertrophy.  We will get a new echocardiogram. Monitor input/output.  Daily weight. Patient   has been advised to take less than 1500 mL of fluid a day and less than  2 g of salt a day at home.  Hyponatremia: Most likely secondary to hypervolemic hyponatremia secondary to CHF.  We will continue to monitor sodium level.  Severe bilateral lower extremity edema/venous stasis ulcer: Has ulceration on left lower extremity secondary to severe edema .Requested for wound care evaluation.  Will elevate the lower extremities.  Abdominal distention: Ultrasound and CT imaging did not show any acute pathology or free fluid.  Showed mostly fat.  No bowel obstruction  CKD stage IV: Currently kidney function on baseline.  We will continue to monitor.  Patient follows with nephrology.  Elevated troponin: Most likely secondary to supply and demand ischemia.  Troponins flat.  Patient denies any chest pain.  Diabetes mellitus type 2: On insulin  at home.  We will continue sliding scale insulin here and continue to monitor his CBG  History of hypertension: Resumed  his home meds.  Debility/deconditioning:Physical therapy evaluation done,recommended home PT   DVT prophylaxis: Heparin [place] Code Status: Full Family Communication: None present at the bed side Disposition Plan: Home in 2 to 3 days   Consultants: Cardiology  Procedures: None  Antimicrobials: None  Subjective: Patient seen and examined the bedside this morning.  Feels much better today.  Respiratory status stable.  Bilateral lower extremity edema has improved.  Objective: Vitals:   01/16/18 1812 01/16/18 2111 01/17/18 0516 01/17/18 0951  BP: (!) 183/85 (!) 151/79 (!) 148/68 (!) 145/62  Pulse: 69  64 (!) 55   Resp:  20 18   Temp: 98.2 F (36.8 C) 98.2 F (36.8 C) 98.7 F (37.1 C)   TempSrc: Oral Oral Oral   SpO2: 96% 97% 96%   Weight: 107.5 kg (236 lb 15.9 oz)  106.1 kg (233 lb 12.8 oz)   Height: 5\' 11"  (1.803 m)       Intake/Output Summary (Last 24 hours) at 01/17/2018 1250 Last data filed at 01/17/2018 1829 Gross  per 24 hour  Intake 120 ml  Output 1225 ml  Net -1105 ml   Filed Weights   01/16/18 1812 01/17/18 0516  Weight: 107.5 kg (236 lb 15.9 oz) 106.1 kg (233 lb 12.8 oz)    Examination:  General exam: Appears calm and comfortable ,Not in distress,average built HEENT:PERRL,Oral mucosa moist, Ear/Nose normal on gross exam Respiratory system: Bilateral equal air entry, normal vesicular breath sounds, no wheezes or crackles  Cardiovascular system: S1 & S2 heard, RRR. No JVD, murmurs, rubs, gallops or clicks. 2+ pedal edema. Gastrointestinal system: Abdomen is distendedand nontender. No organomegaly or masses felt. Normal bowel sounds heard. Central nervous system: Alert and oriented. No focal neurological deficits. Extremities: 2 + edema on lower extremities, no clubbing ,no cyanosis, distal peripheral pulses palpable. Skin: No rashes, lesions or ulcers,no icterus ,no pallor MSK: Normal muscle bulk,tone ,power Psychiatry: Judgement and insight appear normal. Mood & affect appropriate.     Data Reviewed: I have personally reviewed following labs and imaging studies  CBC: Recent Labs  Lab 01/16/18 1118 01/16/18 1404  WBC 6.1 5.9  NEUTROABS 4,386 4.3  HGB 11.4* 11.5*  HCT 31.5* 34.0*  MCV 83.8 85.6  PLT 211 937   Basic Metabolic Panel: Recent Labs  Lab 01/16/18 1118 01/16/18 1404 01/17/18 0151  NA 124* 126* 128*  K 3.9 3.8 3.3*  CL 91* 96* 94*  CO2 21 24 24   GLUCOSE 243* 207* 180*  BUN 33* 34* 38*  CREATININE 1.71* 1.54* 1.67*  CALCIUM 8.6 8.6* 8.6*   GFR: Estimated Creatinine Clearance: 36.4 mL/min (A) (by C-G formula based on SCr of 1.67 mg/dL (H)). Liver Function Tests: Recent Labs  Lab 01/16/18 1118 01/16/18 1404  AST 27 29  ALT 20 27  ALKPHOS  --  56  BILITOT 0.5 0.5  PROT 6.4 7.3  ALBUMIN  --  3.9   No results for input(s): LIPASE, AMYLASE in the last 168 hours. No results for input(s): AMMONIA in the last 168 hours. Coagulation Profile: Recent Labs    Lab 01/16/18 1404  INR 1.05   Cardiac Enzymes: Recent Labs  Lab 01/16/18 1404 01/16/18 2003 01/17/18 0151 01/17/18 0807  TROPONINI 0.08* 0.08* 0.09* 0.09*   BNP (last 3 results) No results for input(s): PROBNP in the last 8760 hours. HbA1C: No results for input(s): HGBA1C in the last 72 hours. CBG: Recent Labs  Lab 01/16/18 1844 01/16/18 2107 01/17/18 0726 01/17/18 1203  GLUCAP 208* 226* 177* 145*   Lipid Profile: No results for input(s): CHOL, HDL, LDLCALC, TRIG, CHOLHDL, LDLDIRECT in the last 72 hours. Thyroid Function Tests: No results for input(s): TSH, T4TOTAL, FREET4, T3FREE, THYROIDAB in the last 72 hours. Anemia Panel: No results for input(s): VITAMINB12, FOLATE, FERRITIN, TIBC, IRON, RETICCTPCT in the last 72 hours. Sepsis Labs: No results for input(s): PROCALCITON, LATICACIDVEN in the last 168 hours.  No results found for this or any previous visit (from the past 240 hour(s)).       Radiology Studies: Ct Abdomen Pelvis Wo Contrast  Result Date: 01/17/2018 CLINICAL DATA:  Abdominal distension over the last year. Worsening lower extremity edema. EXAM: CT ABDOMEN AND PELVIS WITHOUT CONTRAST TECHNIQUE: Multidetector CT imaging of the abdomen and pelvis was performed following the standard protocol without IV contrast. COMPARISON:  Ultrasound same day FINDINGS: Lower chest: Bilateral pleural effusions layering dependently with dependent pulmonary atelectasis. Mild cardiomegaly. No visible pericardial fluid. Hepatobiliary: Nonspecific 12 mm low-density in the right lobe of the liver image 17. Otherwise no abnormal liver finding. No calcified gallstones. Pancreas: Normal Spleen: Normal Adrenals/Urinary Tract: Right adrenal gland is normal. 2.3 cm left adrenal mass with density is low OS 15 Hounsfield units. Second smaller mass in the left adrenal gland measuring 11 mm of similar density. Kidneys appear normal without contrast. No bladder abnormality is seen.  Stomach/Bowel: Fairly large amount of gas within the intestine but without an obstructive pattern. No focal bowel lesion is seen. There is a periumbilical ventral hernia containing only fat. Vascular/Lymphatic: Aortic atherosclerosis. No aneurysm. IVC is normal. No retroperitoneal adenopathy. Reproductive: Normal Other: No free fluid or air. Musculoskeletal: Chronic postsurgical and degenerative changes of the spine. IMPRESSION: No ascites. Large amount of intra-abdominal fat. Moderate amount of gas in the intestine without obstructive pattern. Small periumbilical ventral hernia containing only fat. Two adrenal masses on the left, the larger measuring 2.3 cm in diameter with density as low as 15 Hounsfield units. These are likely to represent benign adenomas. Aortic atherosclerosis. Electronically Signed   By: Nelson Chimes M.D.   On: 01/17/2018 11:40   Dg Chest 2 View  Result Date: 01/16/2018 CLINICAL DATA:  Shortness of breath with exertion, worsening recently. History of congestive heart failure. EXAM: CHEST - 2 VIEW COMPARISON:  10/06/2016. FINDINGS: Cardiomegaly. Aortic atherosclerosis. Pulmonary venous hypertension. Mild interstitial edema. Small effusions layering dependently. No acute bone finding. IMPRESSION: Congestive heart failure. Cardiomegaly. Pulmonary venous hypertension. Interstitial edema. Small effusions. Electronically Signed   By: Nelson Chimes M.D.   On: 01/16/2018 13:12   US Abdomen Limited  Result Date: 01/17/2018 CLINICAL DATA:  Evaluate for ascites.  Distended abdomen EXAM: LIMITED ABDOMEN ULTRASOUND FOR ASCITES TECHNIQUE: Limited ultrasound survey for ascites was performed in all four abdominal quadrants. COMPARISON:  None. FINDINGS: Surveillance images show no ascites. No incidental fluid dilated bowel or gross mass lesion. IMPRESSION: Negative for ascites. Electronically Signed   By: Monte Fantasia M.D.   On: 01/17/2018 07:27        Scheduled Meds: . aspirin EC  81 mg Oral  Daily  . carvedilol  6.25 mg Oral BID WC  . dextromethorphan-guaiFENesin  1 tablet Oral BID  . diclofenac sodium  2 g Topical QID  . furosemide  60 mg Intravenous BID  . heparin  5,000 Units Subcutaneous Q8H  . hydrALAZINE  100 mg Oral TID  . insulin aspart  0-15 Units Subcutaneous TID WC  . insulin aspart  0-5 Units Subcutaneous QHS  . mirabegron ER  50 mg Oral Daily  . polyethylene glycol  17 g Oral Daily  . senna-docusate  1 tablet Oral QHS   Continuous Infusions:   LOS: 1 day    Time spent:25 mins. More than 50% of that time was spent in counseling and/or coordination of care.      Shelly Coss, MD [facility] Pager [phone]  If 7PM-7AM, please contact night-coverage [email or web address] Password [email or web address] 01/17/2018, 12:50 PM m

## 2018-01-18 ENCOUNTER — Ambulatory Visit: Payer: Medicare Other | Admitting: Physician Assistant

## 2018-01-18 DIAGNOSIS — E118 Type 2 diabetes mellitus with unspecified complications: Secondary | ICD-10-CM

## 2018-01-18 LAB — GLUCOSE, CAPILLARY
GLUCOSE-CAPILLARY: 284 mg/dL — AB (ref 70–99)
Glucose-Capillary: 191 mg/dL — ABNORMAL HIGH (ref 70–99)
Glucose-Capillary: 187 mg/dL — ABNORMAL HIGH (ref 70–99)
Glucose-Capillary: 210 mg/dL — ABNORMAL HIGH (ref 70–99)

## 2018-01-18 LAB — BASIC METABOLIC PANEL
Anion gap: 10 (ref 5–15)
BUN: 40 mg/dL — AB (ref 8–23)
CALCIUM: 8.7 mg/dL — AB (ref 8.9–10.3)
CO2: 25 mmol/L (ref 22–32)
Chloride: 96 mmol/L — ABNORMAL LOW (ref 98–111)
Creatinine, Ser: 1.81 mg/dL — ABNORMAL HIGH (ref 0.61–1.24)
GFR, EST AFRICAN AMERICAN: 36 mL/min — AB (ref >60)
GFR, EST NON AFRICAN AMERICAN: 31 mL/min — AB (ref >60)
Glucose, Bld: 189 mg/dL — ABNORMAL HIGH (ref 70–99)
Potassium: 3.9 mmol/L (ref 3.5–5.1)
Sodium: 131 mmol/L — ABNORMAL LOW (ref 135–145)

## 2018-01-18 MED ORDER — BRIMONIDINE TARTRATE 0.2 % OP SOLN
1.0000 [drp] | Freq: Three times a day (TID) | OPHTHALMIC | Status: DC
  Administered 2018-01-18 – 2018-01-20 (×5): 1 [drp] via OPHTHALMIC
  Filled 2018-01-18: qty 5

## 2018-01-18 MED ORDER — BRINZOLAMIDE 1 % OP SUSP
1.0000 [drp] | Freq: Three times a day (TID) | OPHTHALMIC | Status: DC
  Administered 2018-01-18 – 2018-01-20 (×5): 1 [drp] via OPHTHALMIC
  Filled 2018-01-18: qty 10

## 2018-01-18 MED ORDER — AMLODIPINE BESYLATE 5 MG PO TABS
5.0000 mg | ORAL_TABLET | Freq: Once | ORAL | Status: AC
  Administered 2018-01-18: 5 mg via ORAL
  Filled 2018-01-18: qty 1

## 2018-01-18 MED ORDER — AMLODIPINE BESYLATE 10 MG PO TABS
10.0000 mg | ORAL_TABLET | Freq: Every day | ORAL | Status: DC
  Administered 2018-01-19 – 2018-01-20 (×2): 10 mg via ORAL
  Filled 2018-01-18 (×2): qty 1

## 2018-01-18 MED ORDER — LATANOPROST 0.005 % OP SOLN
1.0000 [drp] | Freq: Every day | OPHTHALMIC | Status: DC
  Administered 2018-01-18 – 2018-01-19 (×2): 1 [drp] via OPHTHALMIC
  Filled 2018-01-18: qty 2.5

## 2018-01-18 NOTE — Care Management Note (Signed)
Case Management Note  Patient Details  Name: MILAS SCHAPPELL MRN: [id number] Date of Birth: [birth year]  Subjective/Objective: Spoke to patient about d/c plans-he plans to d/c home-has custodial level care-states he has used [facility] in past-still wants to use them-PT-recc HHPT;recc-HHRN-CHF protocal. Patient declines any other [facility] program since he only wants the same people in his home. [facility] rep Santiago Glad aware. Await final [place] orders.                   Action/Plan:d/c home w/HHC.   Expected Discharge Date:  (unknown)               Expected Discharge Plan:  [place]  In-House Referral:     Discharge planning Services  CM Consult  Post Acute Care Choice:  Resumption of Svcs/PTA Provider(custodial level care) Choice offered to:  Patient  DME Arranged:    DME Agency:     HH Arranged:    [place] Agency:  [place]  Status of Service:  In process, will continue to follow  If discussed at Long Length of Stay Meetings, dates discussed:    Additional Comments:  Dessa Phi, RN 01/18/2018, 1:00 PM

## 2018-01-18 NOTE — Progress Notes (Signed)
Physical Therapy Treatment Patient Details Name: Gregory Hatfield MRN: [id number] DOB: March 01, 1926 Today's Date: 01/18/2018    History of Present Illness 84 y.o. male with medical history significant of diastolic CHF, CKD stage IV, hypertension, DMII, SDAT. Admitted 7/30 for CHF.     PT Comments    Pt on 2L O2 via [place] upon arrival to room, removed from supplemental O2 for mobility. Pt with good O2sats during ambulation today, ranging from 92-97% on room air. Pt took standing rest break during ambulation to decrease shortness of breath. Pt needs continued verbal cuing throughout session and min physical assist for standing, and will have assist upon d/c home from granddaughter. Continue to follow acutely.    Follow Up Recommendations  Home health PT     Equipment Recommendations  None recommended by PT    Recommendations for Other Services       Precautions / Restrictions Precautions Precautions: Fall Precaution Comments: Monitor Sats throughout session  Restrictions Weight Bearing Restrictions: No    Mobility  Bed Mobility Overal bed mobility: Needs Assistance Bed Mobility: Supine to Sit     Supine to sit: Min guard     General bed mobility comments: increased time to get up, min guard when sitting up for safety  Transfers Overall transfer level: Needs assistance Equipment used: Rolling walker (2 wheeled) Transfers: Sit to/from Stand Sit to Stand: Min assist         General transfer comment: sit to stand x2, min assist for power up and trunk extension. Pt requires increased time and verbal cuing to achieve erect trunk posture   Ambulation/Gait Ambulation/Gait assistance: Min guard Gait Distance (Feet): 200 Feet Assistive device: Rolling walker (2 wheeled) Gait Pattern/deviations: Step-through pattern;Decreased stride length;Trunk flexed;Drifts right/left Gait velocity: decreased    General Gait Details: min guard for safety and pt reporting "I fall out, out of  nowhere" to PT. Sats maintained above 92% on room air, checked at start of session (97%), and when patient felt out of breath (92%). Recovered to 95% with standing rest break.    Stairs             Wheelchair Mobility    Modified Rankin (Stroke Patients Only)       Balance Overall balance assessment: Needs assistance;History of Falls Sitting-balance support: No upper extremity supported;Feet supported Sitting balance-Leahy Scale: Fair     Standing balance support: Bilateral upper extremity supported;During functional activity Standing balance-Leahy Scale: Poor Standing balance comment: requires steadying during standing, relies on RW for UE support                             Cognition Arousal/Alertness: Awake/alert Behavior During Therapy: WFL for tasks assessed/performed Overall Cognitive Status: No family/caregiver present to determine baseline cognitive functioning                                 General Comments: pt with similar cognition status as yesterday, at times being difficult to understand/follow.       Exercises      General Comments        Pertinent Vitals/Pain Pain Assessment: No/denies pain    Home Living                      Prior Function            PT Goals (current goals  can now be found in the care plan section) Acute Rehab PT Goals PT Goal Formulation: With patient Time For Goal Achievement: 02/01/18 Potential to Achieve Goals: Good Progress towards PT goals: Progressing toward goals    Frequency    Min 3X/week      PT Plan Current plan remains appropriate    Co-evaluation              AM-PAC PT "6 Clicks" Daily Activity  Outcome Measure  Difficulty turning over in bed (including adjusting bedclothes, sheets and blankets)?: A Little Difficulty moving from lying on back to sitting on the side of the bed? : Unable Difficulty sitting down on and standing up from a chair with arms  (e.g., wheelchair, bedside commode, etc,.)?: Unable Help needed moving to and from a bed to chair (including a wheelchair)?: A Little Help needed walking in hospital room?: A Little Help needed climbing 3-5 steps with a railing? : A Lot 6 Click Score: 13    End of Session Equipment Utilized During Treatment: Gait belt Activity Tolerance: Patient tolerated treatment well;Patient limited by fatigue Patient left: with call bell/phone within reach;with nursing/sitter in room(on bedside commode; nurse tech to move pt back to bed or chair)   PT Visit Diagnosis: Unsteadiness on feet (R26.81);Other abnormalities of gait and mobility (R26.89)     Time: 1140-1200 PT Time Calculation (min) (ACUTE ONLY): 20 min  Charges:  $Gait Training: 8-22 mins                     [name], PT, DPT  Pager # [phone]     [name] 01/18/2018, 3:09 PM

## 2018-01-18 NOTE — Progress Notes (Signed)
PROGRESS NOTE    Gregory Hatfield  ERD:[id number] DOB: 1926/04/12 DOA: 01/16/2018 PCP: [name], MD   Brief Narrative: Gregory Hatfield is a 84 y.o. male with medical history significant of diastolic CHF, CKD stage IV, hypertension, hyperlipidemia who presents from home for evaluation of increased shortness of breath, worsening lower extremity edema.  He is on Lasix at home.  Patient and family reports that his bilateral lower extremity edema has been worsening and he has been having increased shortness of breath on minimal exertion.  Also reported of cough. Patient was seen at his cardiologist's office.  He was recommended to take 60 mg of Lasix twice a day and was sent home.  On the way back to home he became very short of breath and had to be brought to the emergency department.  Family reported that there was improvement of his weight by 15 pounds in last 4 days.  Patient takes his daily weight.  As per the daughter he has been having increase water intake recently.  He takes 2 glasses of water during his each meal. When patient arrived in the emergency department, his oxygen saturation was 90%. Patient admitted for the management of CHF exacerbation.  Assessment & Plan:   Principal Problem:   CHF (congestive heart failure) (HCC) Active Problems:   Essential hypertension   CKD stage 4 due to type 2 diabetes mellitus (HCC)   Diabetes mellitus with complication (HCC)   Acute on chronic diastolic heart failure (HCC)   Hyponatremia   Peripheral edema   Shortness of breath   Acute on chronic diastolic CHF: Presents with worsening shortness of breath.  Has severe bilateral lower extreme edema with blister formation and ulcer on the left lower extremity.  Elevated BNP.  Chest x-ray was suggestive of  increased interstitial edema. Cardiology following.  Increase his Lasix to 80 mg 3 times a day Echocardiogram showed ejection fraction of 60 to 65%, no wall motion abnormality, grade 2  diastolic dysfunction  Monitor input/output.  Daily weight. Patient  has been advised to take less than 1500 mL of fluid a day and less than  2 g of salt a day at home.  Hyponatremia: Improving.Most likely secondary to hypervolemic hyponatremia secondary to CHF.  We will continue to monitor sodium level.  Severe bilateral lower extremity edema/venous stasis ulcer: Has ulceration on left lower extremity secondary to severe edema Wound care following.  Will elevate the lower extremities.  Abdominal distention: Ultrasound and CT imaging did not show any acute pathology or free fluid.  Showed mostly fat.  No bowel obstruction  CKD stage IV: Currently kidney function on baseline.  We will continue to monitor.  Patient follows with nephrology.  Elevated troponin: Most likely secondary to supply and demand ischemia.  Troponins flat.  Patient denies any chest pain.  Diabetes mellitus type 2: On insulin  at home.  We will continue sliding scale insulin here and continue to monitor his CBG  History of hypertension: Resumed  his home meds.  Debility/deconditioning:Physical therapy evaluation done,recommended home PT   DVT prophylaxis: Heparin [place] Code Status: Full Family Communication: None present at the bed side Disposition Plan: Home in 1-2 days.Needs more diuresis   Consultants: Cardiology  Procedures: None  Antimicrobials: None  Subjective: Patient seen and examined the bedside this morning.  Feels much better today.  Respiratory status stable.  Bilateral lower extremity edema improving.  Objective: Vitals:   01/17/18 2105 01/18/18 0533 01/18/18 0609 01/18/18 1004  BP: Marland Kitchen)  153/71 (!) 153/80  137/62  Pulse: (!) 55 (!) 58  67  Resp: 18 18    Temp: 98.2 F (36.8 C) 98.4 F (36.9 C)    TempSrc: Oral Oral    SpO2: 96% 96%    Weight:   104.5 kg (230 lb 6.1 oz)   Height:        Intake/Output Summary (Last 24 hours) at 01/18/2018 1314 Last data filed at 01/18/2018 1202 Gross  per 24 hour  Intake 360 ml  Output 3352 ml  Net -2992 ml   Filed Weights   01/16/18 1812 01/17/18 0516 01/18/18 0609  Weight: 107.5 kg (236 lb 15.9 oz) 106.1 kg (233 lb 12.8 oz) 104.5 kg (230 lb 6.1 oz)    Examination:  General exam: Appears calm and comfortable ,Not in distress,average built HEENT:PERRL,Oral mucosa moist, Ear/Nose normal on gross exam Respiratory system: Bilateral equal air entry, normal vesicular breath sounds, no wheezes or crackles  Cardiovascular system: S1 & S2 heard, RRR. No JVD, murmurs, rubs, gallops or clicks. 2+ pedal edema. Gastrointestinal system: Abdomen is distendedand nontender. No organomegaly or masses felt. Normal bowel sounds heard. Central nervous system: Alert and oriented. No focal neurological deficits. Extremities: 2 + edema on lower extremities, no clubbing ,no cyanosis, distal peripheral pulses palpable. Skin: No rashes, lesions ,no icterus ,no pallor,ulcer on the left leg MSK: Normal muscle bulk,tone ,power Psychiatry: Judgement and insight appear normal. Mood & affect appropriate.     Data Reviewed: I have personally reviewed following labs and imaging studies  CBC: Recent Labs  Lab 01/16/18 1118 01/16/18 1404  WBC 6.1 5.9  NEUTROABS 4,386 4.3  HGB 11.4* 11.5*  HCT 31.5* 34.0*  MCV 83.8 85.6  PLT 211 962   Basic Metabolic Panel: Recent Labs  Lab 01/16/18 1118 01/16/18 1404 01/17/18 0151 01/18/18 0617  NA 124* 126* 128* 131*  K 3.9 3.8 3.3* 3.9  CL 91* 96* 94* 96*  CO2 21 24 24 25   GLUCOSE 243* 207* 180* 189*  BUN 33* 34* 38* 40*  CREATININE 1.71* 1.54* 1.67* 1.81*  CALCIUM 8.6 8.6* 8.6* 8.7*   GFR: Estimated Creatinine Clearance: 33.4 mL/min (A) (by C-G formula based on SCr of 1.81 mg/dL (H)). Liver Function Tests: Recent Labs  Lab 01/16/18 1118 01/16/18 1404  AST 27 29  ALT 20 27  ALKPHOS  --  56  BILITOT 0.5 0.5  PROT 6.4 7.3  ALBUMIN  --  3.9   No results for input(s): LIPASE, AMYLASE in the last 168  hours. No results for input(s): AMMONIA in the last 168 hours. Coagulation Profile: Recent Labs  Lab 01/16/18 1404  INR 1.05   Cardiac Enzymes: Recent Labs  Lab 01/16/18 1404 01/16/18 2003 01/17/18 0151 01/17/18 0807  TROPONINI 0.08* 0.08* 0.09* 0.09*   BNP (last 3 results) No results for input(s): PROBNP in the last 8760 hours. HbA1C: No results for input(s): HGBA1C in the last 72 hours. CBG: Recent Labs  Lab 01/17/18 1203 01/17/18 1648 01/17/18 2101 01/18/18 0731 01/18/18 1140  GLUCAP 145* 188* 191* 191* 284*   Lipid Profile: No results for input(s): CHOL, HDL, LDLCALC, TRIG, CHOLHDL, LDLDIRECT in the last 72 hours. Thyroid Function Tests: No results for input(s): TSH, T4TOTAL, FREET4, T3FREE, THYROIDAB in the last 72 hours. Anemia Panel: No results for input(s): VITAMINB12, FOLATE, FERRITIN, TIBC, IRON, RETICCTPCT in the last 72 hours. Sepsis Labs: No results for input(s): PROCALCITON, LATICACIDVEN in the last 168 hours.  No results found for this or any  previous visit (from the past 240 hour(s)).       Radiology Studies: Ct Abdomen Pelvis Wo Contrast  Result Date: 01/17/2018 CLINICAL DATA:  Abdominal distension over the last year. Worsening lower extremity edema. EXAM: CT ABDOMEN AND PELVIS WITHOUT CONTRAST TECHNIQUE: Multidetector CT imaging of the abdomen and pelvis was performed following the standard protocol without IV contrast. COMPARISON:  Ultrasound same day FINDINGS: Lower chest: Bilateral pleural effusions layering dependently with dependent pulmonary atelectasis. Mild cardiomegaly. No visible pericardial fluid. Hepatobiliary: Nonspecific 12 mm low-density in the right lobe of the liver image 17. Otherwise no abnormal liver finding. No calcified gallstones. Pancreas: Normal Spleen: Normal Adrenals/Urinary Tract: Right adrenal gland is normal. 2.3 cm left adrenal mass with density is low OS 15 Hounsfield units. Second smaller mass in the left adrenal  gland measuring 11 mm of similar density. Kidneys appear normal without contrast. No bladder abnormality is seen. Stomach/Bowel: Fairly large amount of gas within the intestine but without an obstructive pattern. No focal bowel lesion is seen. There is a periumbilical ventral hernia containing only fat. Vascular/Lymphatic: Aortic atherosclerosis. No aneurysm. IVC is normal. No retroperitoneal adenopathy. Reproductive: Normal Other: No free fluid or air. Musculoskeletal: Chronic postsurgical and degenerative changes of the spine. IMPRESSION: No ascites. Large amount of intra-abdominal fat. Moderate amount of gas in the intestine without obstructive pattern. Small periumbilical ventral hernia containing only fat. Two adrenal masses on the left, the larger measuring 2.3 cm in diameter with density as low as 15 Hounsfield units. These are likely to represent benign adenomas. Aortic atherosclerosis. Electronically Signed   By: Nelson Chimes M.D.   On: 01/17/2018 11:40   US Abdomen Limited  Result Date: 01/17/2018 CLINICAL DATA:  Evaluate for ascites.  Distended abdomen EXAM: LIMITED ABDOMEN ULTRASOUND FOR ASCITES TECHNIQUE: Limited ultrasound survey for ascites was performed in all four abdominal quadrants. COMPARISON:  None. FINDINGS: Surveillance images show no ascites. No incidental fluid dilated bowel or gross mass lesion. IMPRESSION: Negative for ascites. Electronically Signed   By: Monte Fantasia M.D.   On: 01/17/2018 07:27        Scheduled Meds: . amLODipine  5 mg Oral Daily  . aspirin EC  81 mg Oral Daily  . carvedilol  6.25 mg Oral BID WC  . dextromethorphan-guaiFENesin  1 tablet Oral BID  . diclofenac sodium  2 g Topical QID  . furosemide  80 mg Intravenous Q8H  . heparin  5,000 Units Subcutaneous Q8H  . hydrALAZINE  100 mg Oral TID  . insulin aspart  0-15 Units Subcutaneous TID WC  . insulin aspart  0-5 Units Subcutaneous QHS  . mirabegron ER  50 mg Oral Daily  . polyethylene glycol  17  g Oral Daily  . potassium chloride  20 mEq Oral BID  . senna-docusate  1 tablet Oral QHS   Continuous Infusions:   LOS: 2 days    Time spent:25 mins. More than 50% of that time was spent in counseling and/or coordination of care.      Shelly Coss, MD [facility] Pager [phone]  If 7PM-7AM, please contact night-coverage [email or web address] Password [email or web address] 01/18/2018, 1:14 PM m

## 2018-01-18 NOTE — Plan of Care (Addendum)
Pt is aware he is on medication to diurese. Pt also pulled condom cath off, so charted OP is not accurate.

## 2018-01-18 NOTE — Progress Notes (Signed)
Progress Note  Patient Name: Gregory Hatfield Date of Encounter: 01/18/2018  Primary Cardiologist: No primary care provider on file.   Subjective   Feeling much better.  Breathing improving.   Inpatient Medications    Scheduled Meds: . amLODipine  5 mg Oral Daily  . aspirin EC  81 mg Oral Daily  . carvedilol  6.25 mg Oral BID WC  . dextromethorphan-guaiFENesin  1 tablet Oral BID  . diclofenac sodium  2 g Topical QID  . furosemide  80 mg Intravenous Q8H  . heparin  5,000 Units Subcutaneous Q8H  . hydrALAZINE  100 mg Oral TID  . insulin aspart  0-15 Units Subcutaneous TID WC  . insulin aspart  0-5 Units Subcutaneous QHS  . mirabegron ER  50 mg Oral Daily  . polyethylene glycol  17 g Oral Daily  . potassium chloride  20 mEq Oral BID  . senna-docusate  1 tablet Oral QHS   Continuous Infusions:  PRN Meds: acetaminophen   Vital Signs    Vitals:   01/18/18 0533 01/18/18 0609 01/18/18 1004 01/18/18 1327  BP: (!) 153/80  137/62 (!) 168/69  Pulse: (!) 58  67 64  Resp: 18     Temp: 98.4 F (36.9 C)     TempSrc: Oral     SpO2: 96%   92%  Weight:  230 lb 6.1 oz (104.5 kg)    Height:        Intake/Output Summary (Last 24 hours) at 01/18/2018 1458 Last data filed at 01/18/2018 1300 Gross per 24 hour  Intake 480 ml  Output 3352 ml  Net -2872 ml   Filed Weights   01/16/18 1812 01/17/18 0516 01/18/18 0609  Weight: 236 lb 15.9 oz (107.5 kg) 233 lb 12.8 oz (106.1 kg) 230 lb 6.1 oz (104.5 kg)    Telemetry    Sinus rhythm.  PVCs - Personally Reviewed  ECG    Sinus rhythm.  PVCs.  LBBB. LAFB.   - Personally Reviewed  Physical Exam   VS:  BP (!) 168/69 (BP Location: Left Arm)   Pulse 64   Temp 98.4 F (36.9 C) (Oral)   Resp 18   Ht 5\' 11"  (1.803 m)   Wt 230 lb 6.1 oz (104.5 kg)   SpO2 92%   BMI 32.13 kg/m  , BMI Body mass index is 32.13 kg/m. GENERAL:  Well appearing HEENT: Pupils equal round and reactive, fundi not visualized, oral mucosa unremarkable NECK:  + jugular venous distention, waveform within normal limits, carotid upstroke brisk and symmetric, no bruits, no thyromegaly LYMPHATICS:  No cervical adenopathy LUNGS:  Clear to auscultation bilaterally HEART:  RRR.  PMI not displaced or sustained,S1 and S2 within normal limits, no S3, no S4, no clicks, no rubs, no murmurs ABD:  Flat, positive bowel sounds normal in frequency in pitch, no bruits, no rebound, no guarding, no midline pulsatile mass, no hepatomegaly, no splenomegaly EXT:  2 plus pulses throughout, no edema, no cyanosis no clubbing SKIN:  No rashes no nodules NEURO:  Cranial nerves II through XII grossly intact, motor grossly intact throughout [facility]:  Cognitively intact, oriented to person place and time   Labs    Chemistry Recent Labs  Lab 01/16/18 1118 01/16/18 1404 01/17/18 0151 01/18/18 0617  NA 124* 126* 128* 131*  K 3.9 3.8 3.3* 3.9  CL 91* 96* 94* 96*  CO2 21 24 24 25   GLUCOSE 243* 207* 180* 189*  BUN 33* 34* 38* 40*  CREATININE  1.71* 1.54* 1.67* 1.81*  CALCIUM 8.6 8.6* 8.6* 8.7*  PROT 6.4 7.3  --   --   ALBUMIN  --  3.9  --   --   AST 27 29  --   --   ALT 20 27  --   --   ALKPHOS  --  56  --   --   BILITOT 0.5 0.5  --   --   GFRNONAA 34* 38* 34* 31*  GFRAA 40* 44* 40* 36*  ANIONGAP  --  6 10 10      Hematology Recent Labs  Lab 01/16/18 1118 01/16/18 1404  WBC 6.1 5.9  RBC 3.76* 3.97*  HGB 11.4* 11.5*  HCT 31.5* 34.0*  MCV 83.8 85.6  MCH 30.3 29.0  MCHC 36.2* 33.8  RDW 12.5 13.4  PLT 211 246    Cardiac Enzymes Recent Labs  Lab 01/16/18 1404 01/16/18 2003 01/17/18 0151 01/17/18 0807  TROPONINI 0.08* 0.08* 0.09* 0.09*   No results for input(s): TROPIPOC in the last 168 hours.   BNP Recent Labs  Lab 01/16/18 1118 01/16/18 1404  BNP 599* 552.9*     DDimer No results for input(s): DDIMER in the last 168 hours.   Radiology    Ct Abdomen Pelvis Wo Contrast  Result Date: 01/17/2018 CLINICAL DATA:  Abdominal distension over the  last year. Worsening lower extremity edema. EXAM: CT ABDOMEN AND PELVIS WITHOUT CONTRAST TECHNIQUE: Multidetector CT imaging of the abdomen and pelvis was performed following the standard protocol without IV contrast. COMPARISON:  Ultrasound same day FINDINGS: Lower chest: Bilateral pleural effusions layering dependently with dependent pulmonary atelectasis. Mild cardiomegaly. No visible pericardial fluid. Hepatobiliary: Nonspecific 12 mm low-density in the right lobe of the liver image 17. Otherwise no abnormal liver finding. No calcified gallstones. Pancreas: Normal Spleen: Normal Adrenals/Urinary Tract: Right adrenal gland is normal. 2.3 cm left adrenal mass with density is low OS 15 Hounsfield units. Second smaller mass in the left adrenal gland measuring 11 mm of similar density. Kidneys appear normal without contrast. No bladder abnormality is seen. Stomach/Bowel: Fairly large amount of gas within the intestine but without an obstructive pattern. No focal bowel lesion is seen. There is a periumbilical ventral hernia containing only fat. Vascular/Lymphatic: Aortic atherosclerosis. No aneurysm. IVC is normal. No retroperitoneal adenopathy. Reproductive: Normal Other: No free fluid or air. Musculoskeletal: Chronic postsurgical and degenerative changes of the spine. IMPRESSION: No ascites. Large amount of intra-abdominal fat. Moderate amount of gas in the intestine without obstructive pattern. Small periumbilical ventral hernia containing only fat. Two adrenal masses on the left, the larger measuring 2.3 cm in diameter with density as low as 15 Hounsfield units. These are likely to represent benign adenomas. Aortic atherosclerosis. Electronically Signed   By: Nelson Chimes M.D.   On: 01/17/2018 11:40   US Abdomen Limited  Result Date: 01/17/2018 CLINICAL DATA:  Evaluate for ascites.  Distended abdomen EXAM: LIMITED ABDOMEN ULTRASOUND FOR ASCITES TECHNIQUE: Limited ultrasound survey for ascites was performed in  all four abdominal quadrants. COMPARISON:  None. FINDINGS: Surveillance images show no ascites. No incidental fluid dilated bowel or gross mass lesion. IMPRESSION: Negative for ascites. Electronically Signed   By: Monte Fantasia M.D.   On: 01/17/2018 07:27    Cardiac Studies   Echo 01/17/18:  Study Conclusions  - Left ventricle: The cavity size was normal. There was moderate   concentric hypertrophy. Systolic function was normal. The   estimated ejection fraction was in the range of 60% to  65%. Wall   motion was normal; there were no regional wall motion   abnormalities. Features are consistent with a pseudonormal left   ventricular filling pattern, with concomitant abnormal relaxation   and increased filling pressure (grade 2 diastolic dysfunction). - Aortic valve: Trileaflet; moderately thickened, moderately   calcified leaflets. - Aorta: Aortic root dimension: 45 mm (ED) at sinus of Valsalva.   Remainder of root is normal. - Ascending aorta: The ascending aorta was mildly dilated. - Mitral valve: Calcified annulus. Mildly thickened leaflets .   There was mild regurgitation.  Patient Profile     84 yo male with PMH of HTN, CKD, IDDM, HL and bradycardia who presented to with worsening shortness of breath with acute on chronic HF.  Assessment & Plan    # Acute on chronic diastolic heart failure:  LVEF 60-65% with grade 2 diastolic dysfunction on echo this admission.  Weight is down 3lb but only net -1.5L.  Some urine was lost when condom cath came off.  He is feeling better.  Continue diuresis with IV lasix.  Unna boots are in place.  He was drinking a gallon of water daily prior to admission in an attempt to help lower his glucose.  # Hypertensive heart disease: # Bradycardia: BP remains poorly controlled.  We will increase amlodipine to 10mg .  If BP remains >130/80 will add clonidine.  We aren't titrating carvedilol 2/2 bradycardia.  Continue hydralazine.  Consider ARB if renal  function stable with diuresis.   # Mild ascending aorta aneurysm: Given age no need for serial surveillance.         For questions or updates, please contact [place] Please consult [email or web address] for contact info under Cardiology/STEMI.      Signed, Skeet Latch, MD  01/18/2018, 2:58 PM

## 2018-01-18 NOTE — Care Management Note (Signed)
Case Management Note  Patient Details  Name: [name] MRN: [id number] Date of Birth: Mar 16, 1926  Subjective/Objective:  [facility] rep Santiago Glad unable to accept; Spoke to attending who agrees to [facility] @ d/c. Informed attending about HHRN-CHF program-agree to [facility]. PT-recc HHPT.Will discuss [facility] CHF program w/dtr(Rita Hemrick)-left vm for call back #.                  Action/Plan:d/c home w/HHC.   Expected Discharge Date:  (unknown)               Expected Discharge Plan:  [place]  In-House Referral:     Discharge planning Services  CM Consult  Post Acute Care Choice:  Resumption of Svcs/PTA Provider(custodial level care) Choice offered to:  Patient  DME Arranged:    DME Agency:     HH Arranged:    [place] Agency:     Status of Service:  In process, will continue to follow  If discussed at Long Length of Stay Meetings, dates discussed:    Additional Comments:  Dessa Phi, RN 01/18/2018, 3:47 PM

## 2018-01-19 LAB — BASIC METABOLIC PANEL
Anion gap: 11 (ref 5–15)
BUN: 48 mg/dL — AB (ref 8–23)
CO2: 28 mmol/L (ref 22–32)
Calcium: 9 mg/dL (ref 8.9–10.3)
Chloride: 97 mmol/L — ABNORMAL LOW (ref 98–111)
Creatinine, Ser: 1.83 mg/dL — ABNORMAL HIGH (ref 0.61–1.24)
GFR calc Af Amer: 36 mL/min — ABNORMAL LOW (ref >60)
GFR calc non Af Amer: 31 mL/min — ABNORMAL LOW (ref >60)
GLUCOSE: 284 mg/dL — AB (ref 70–99)
POTASSIUM: 4.3 mmol/L (ref 3.5–5.1)
Sodium: 136 mmol/L (ref 135–145)

## 2018-01-19 LAB — GLUCOSE, CAPILLARY
GLUCOSE-CAPILLARY: 249 mg/dL — AB (ref 70–99)
GLUCOSE-CAPILLARY: 247 mg/dL — AB (ref 70–99)
GLUCOSE-CAPILLARY: 191 mg/dL — AB (ref 70–99)
Glucose-Capillary: 303 mg/dL — ABNORMAL HIGH (ref 70–99)

## 2018-01-19 MED ORDER — CLONIDINE HCL 0.1 MG PO TABS
0.1000 mg | ORAL_TABLET | Freq: Two times a day (BID) | ORAL | Status: DC
  Administered 2018-01-19 – 2018-01-20 (×3): 0.1 mg via ORAL
  Filled 2018-01-19 (×3): qty 1

## 2018-01-19 MED ORDER — INSULIN GLARGINE 100 UNIT/ML ~~LOC~~ SOLN
20.0000 [IU] | Freq: Every day | SUBCUTANEOUS | Status: DC
  Administered 2018-01-19 – 2018-01-20 (×2): 20 [IU] via SUBCUTANEOUS
  Filled 2018-01-19 (×3): qty 0.2

## 2018-01-19 MED ORDER — FUROSEMIDE 40 MG PO TABS
80.0000 mg | ORAL_TABLET | Freq: Two times a day (BID) | ORAL | Status: DC
  Administered 2018-01-19 – 2018-01-20 (×2): 80 mg via ORAL
  Filled 2018-01-19 (×2): qty 2

## 2018-01-19 NOTE — Care Management Note (Signed)
Case Management Note  Patient Details  Name: Gregory Hatfield MRN: [id number] Date of Birth: Feb 02, 1926  Subjective/Objective: Spoke to dtr(per patient request) Velva Harman [phone]-informed about HHRN-CHF protocal through Dr. Duayne Cal is in agreement, & talk to [facility] rep Ronalee Belts for more info-she requested rep mike to contact her @ 2p-Mike aware & will contact her then. Await HHRN-heart failure protocal/HHPT orders,face to face-MD aware. No further CM needs.                  Action/Plan:d/c home w/HHC-CHF protocal.   Expected Discharge Date:  (unknown)               Expected Discharge Plan:  [place]  In-House Referral:     Discharge planning Services  CM Consult  Post Acute Care Choice:  Resumption of Svcs/PTA Provider(custodial level care) Choice offered to:  Patient  DME Arranged:    DME Agency:     HH Arranged:  RN, PT, Disease Management(CHF protocal) [place] Agency:  [facility] (formerly [employer])  Status of Service:  Completed, signed off  If discussed at [employer], dates discussed:    Additional Comments:  Dessa Phi, RN 01/19/2018, 11:37 AM

## 2018-01-19 NOTE — Progress Notes (Signed)
PROGRESS NOTE    Gregory Hatfield  WNI:[id number] DOB: August 16, 1925 DOA: 01/16/2018 PCP: [name], MD   Brief Narrative: Gregory Hatfield is a 84 y.o. male with medical history significant of diastolic CHF, CKD stage IV, hypertension, hyperlipidemia who presents from home for evaluation of increased shortness of breath, worsening lower extremity edema.  He is on Lasix at home.  Patient and family reports that his bilateral lower extremity edema has been worsening and he has been having increased shortness of breath on minimal exertion.  Also reported of cough. Patient was seen at his cardiologist's office.  He was recommended to take 60 mg of Lasix twice a day and was sent home.  On the way back to home he became very short of breath and had to be brought to the emergency department.  Family reported that there was improvement of his weight by 15 pounds in last 4 days.  Patient takes his daily weight.  As per the daughter he has been having increase water intake recently.  He takes 2 glasses of water during his each meal. When patient arrived in the emergency department, his oxygen saturation was 90%. Patient admitted for the management of CHF exacerbation.  Assessment & Plan:   Principal Problem:   CHF (congestive heart failure) (HCC) Active Problems:   Essential hypertension   CKD stage 4 due to type 2 diabetes mellitus (HCC)   Diabetes mellitus with complication (HCC)   Acute on chronic diastolic heart failure (HCC)   Hyponatremia   Peripheral edema   Shortness of breath   Acute on chronic diastolic CHF: Presents with worsening shortness of breath.  Presented with severe bilateral lower extreme edema with blister formation and ulcer on the left lower extremity.  Elevated BNP.  Chest x-ray was suggestive of  increased interstitial edema. Cardiology following. Decreased  Lasix to 80 mg ,2 times a day Echocardiogram showed ejection fraction of 60 to 65%, no wall motion abnormality,  grade 2 diastolic dysfunction  Monitor input/output.  Daily weight. Patient  has been advised to take less than 1500 mL of fluid a day and less than  2 g of salt a day at home.  Hyponatremia: Most likely secondary to hypervolemic hyponatremia secondary to CHF. Resolved.  Severe bilateral lower extremity edema/venous stasis ulcer: Has ulceration on left lower extremity secondary to severe edema. Wound care following.  Will elevate the lower extremities.  Abdominal distention: Ultrasound and CT imaging did not show any acute pathology or free fluid.  Showed mostly fat.  No bowel obstruction  CKD stage IV: Currently kidney function around  baseline.  We will continue to monitor.  Patient follows with nephrology.  Elevated troponin: Most likely secondary to supply and demand ischemia.  Troponins flat.  Patient denies any chest pain.  Diabetes mellitus type 2: On insulin  at home.  We will continue sliding scale insulin here and continue to monitor his CBG  History of hypertension: Resumed  his home meds.  Debility/deconditioning:Physical therapy evaluation done,recommended home PT   DVT prophylaxis: Heparin [place] Code Status: Full Family Communication: None present at the bed side Disposition Plan: Home in 1-2 days.Needs more diuresis.Awaiting cardiology clearance   Consultants: Cardiology  Procedures: None  Antimicrobials: None  Subjective: Patient seen and examined the bedside this morning.  Feels much better today.  Respiratory status stable.  Bilateral lower extremity edema improving.No new complains.  Objective: Vitals:   01/18/18 2112 01/19/18 0452 01/19/18 0702 01/19/18 1055  BP: (!) 150/76 Marland Kitchen)  156/61  (!) 159/66  Pulse: 60 (!) 58  61  Resp: 17 18    Temp: 98.5 F (36.9 C) 98.3 F (36.8 C)    TempSrc: Oral Oral    SpO2: 93% 96%    Weight:   101.2 kg (223 lb 1.7 oz)   Height:        Intake/Output Summary (Last 24 hours) at 01/19/2018 1238 Last data filed at  01/19/2018 1121 Gross per 24 hour  Intake 480 ml  Output 2300 ml  Net -1820 ml   Filed Weights   01/17/18 0516 01/18/18 0609 01/19/18 9509  Weight: 106.1 kg (233 lb 12.8 oz) 104.5 kg (230 lb 6.1 oz) 101.2 kg (223 lb 1.7 oz)    Examination:  General exam: Appears calm and comfortable ,Not in distress,average built HEENT:PERRL,Oral mucosa moist, Ear/Nose normal on gross exam Respiratory system: Bilateral equal air entry, normal vesicular breath sounds, no wheezes or crackles  Cardiovascular system: S1 & S2 heard, RRR. No JVD, murmurs, rubs, gallops or clicks. 2+ pedal edema. Gastrointestinal system: Abdomen is distendedand nontender. No organomegaly or masses felt. Normal bowel sounds heard. Central nervous system: Alert and oriented. No focal neurological deficits. Extremities: 2 + edema on lower extremities, no clubbing ,no cyanosis, distal peripheral pulses palpable. Skin: No rashes, lesions ,no icterus ,no pallor,There is an ulcer on the left leg MSK: Normal muscle bulk,tone ,power Psychiatry: Judgement and insight appear normal. Mood & affect appropriate.     Data Reviewed: I have personally reviewed following labs and imaging studies  CBC: Recent Labs  Lab 01/16/18 1118 01/16/18 1404  WBC 6.1 5.9  NEUTROABS 4,386 4.3  HGB 11.4* 11.5*  HCT 31.5* 34.0*  MCV 83.8 85.6  PLT 211 326   Basic Metabolic Panel: Recent Labs  Lab 01/16/18 1118 01/16/18 1404 01/17/18 0151 01/18/18 0617 01/19/18 0617  NA 124* 126* 128* 131* 136  K 3.9 3.8 3.3* 3.9 4.3  CL 91* 96* 94* 96* 97*  CO2 21 24 24 25 28   GLUCOSE 243* 207* 180* 189* 284*  BUN 33* 34* 38* 40* 48*  CREATININE 1.71* 1.54* 1.67* 1.81* 1.83*  CALCIUM 8.6 8.6* 8.6* 8.7* 9.0   GFR: Estimated Creatinine Clearance: 32.5 mL/min (A) (by C-G formula based on SCr of 1.83 mg/dL (H)). Liver Function Tests: Recent Labs  Lab 01/16/18 1118 01/16/18 1404  AST 27 29  ALT 20 27  ALKPHOS  --  56  BILITOT 0.5 0.5  PROT 6.4  7.3  ALBUMIN  --  3.9   No results for input(s): LIPASE, AMYLASE in the last 168 hours. No results for input(s): AMMONIA in the last 168 hours. Coagulation Profile: Recent Labs  Lab 01/16/18 1404  INR 1.05   Cardiac Enzymes: Recent Labs  Lab 01/16/18 1404 01/16/18 2003 01/17/18 0151 01/17/18 0807  TROPONINI 0.08* 0.08* 0.09* 0.09*   BNP (last 3 results) No results for input(s): PROBNP in the last 8760 hours. HbA1C: No results for input(s): HGBA1C in the last 72 hours. CBG: Recent Labs  Lab 01/18/18 1140 01/18/18 1715 01/18/18 2113 01/19/18 0754 01/19/18 1143  GLUCAP 284* 187* 210* 249* 303*   Lipid Profile: No results for input(s): CHOL, HDL, LDLCALC, TRIG, CHOLHDL, LDLDIRECT in the last 72 hours. Thyroid Function Tests: No results for input(s): TSH, T4TOTAL, FREET4, T3FREE, THYROIDAB in the last 72 hours. Anemia Panel: No results for input(s): VITAMINB12, FOLATE, FERRITIN, TIBC, IRON, RETICCTPCT in the last 72 hours. Sepsis Labs: No results for input(s): PROCALCITON, LATICACIDVEN in the  last 168 hours.  No results found for this or any previous visit (from the past 240 hour(s)).       Radiology Studies: No results found.      Scheduled Meds: . amLODipine  10 mg Oral Daily  . aspirin EC  81 mg Oral Daily  . brimonidine  1 drop Both Eyes TID  . brinzolamide  1 drop Both Eyes TID  . carvedilol  6.25 mg Oral BID WC  . dextromethorphan-guaiFENesin  1 tablet Oral BID  . diclofenac sodium  2 g Topical QID  . furosemide  80 mg Intravenous Q8H  . heparin  5,000 Units Subcutaneous Q8H  . hydrALAZINE  100 mg Oral TID  . insulin aspart  0-15 Units Subcutaneous TID WC  . insulin aspart  0-5 Units Subcutaneous QHS  . insulin glargine  20 Units Subcutaneous Daily  . latanoprost  1 drop Both Eyes QHS  . mirabegron ER  50 mg Oral Daily  . polyethylene glycol  17 g Oral Daily  . potassium chloride  20 mEq Oral BID  . senna-docusate  1 tablet Oral QHS    Continuous Infusions:   LOS: 3 days    Time spent:25 mins. More than 50% of that time was spent in counseling and/or coordination of care.      Shelly Coss, MD [facility] Pager [phone]  If 7PM-7AM, please contact night-coverage [email or web address] Password [email or web address] 01/19/2018, 12:38 PM m

## 2018-01-19 NOTE — Care Management Important Message (Signed)
Important Message  Patient Details  Name: Gregory Hatfield MRN: [id number] Date of Birth: Jan 04, 1926   Medicare Important Message Given:  Yes    Kerin Salen 01/19/2018, 11:04 AMImportant Message  Patient Details  Name: Gregory Hatfield MRN: [id number] Date of Birth: 02/04/1926   Medicare Important Message Given:  Yes    Kerin Salen 01/19/2018, 11:04 AM

## 2018-01-19 NOTE — Progress Notes (Signed)
Progress Note  Patient Name: Gregory Hatfield Date of Encounter: 01/19/2018  Primary Cardiologist: No primary care provider on file.   Subjective   Feeling much better.  Breathing better each day.  He ambulated without shortness of breath.   Inpatient Medications    Scheduled Meds: . amLODipine  10 mg Oral Daily  . aspirin EC  81 mg Oral Daily  . brimonidine  1 drop Both Eyes TID  . brinzolamide  1 drop Both Eyes TID  . carvedilol  6.25 mg Oral BID WC  . dextromethorphan-guaiFENesin  1 tablet Oral BID  . diclofenac sodium  2 g Topical QID  . furosemide  80 mg Intravenous Q8H  . heparin  5,000 Units Subcutaneous Q8H  . hydrALAZINE  100 mg Oral TID  . insulin aspart  0-15 Units Subcutaneous TID WC  . insulin aspart  0-5 Units Subcutaneous QHS  . insulin glargine  20 Units Subcutaneous Daily  . latanoprost  1 drop Both Eyes QHS  . mirabegron ER  50 mg Oral Daily  . polyethylene glycol  17 g Oral Daily  . potassium chloride  20 mEq Oral BID  . senna-docusate  1 tablet Oral QHS   Continuous Infusions:  PRN Meds: acetaminophen   Vital Signs    Vitals:   01/18/18 2112 01/19/18 0452 01/19/18 0702 01/19/18 1055  BP: (!) 150/76 (!) 156/61  (!) 159/66  Pulse: 60 (!) 58  61  Resp: 17 18    Temp: 98.5 F (36.9 C) 98.3 F (36.8 C)    TempSrc: Oral Oral    SpO2: 93% 96%    Weight:   223 lb 1.7 oz (101.2 kg)   Height:        Intake/Output Summary (Last 24 hours) at 01/19/2018 1227 Last data filed at 01/19/2018 1121 Gross per 24 hour  Intake 480 ml  Output 2300 ml  Net -1820 ml   Filed Weights   01/17/18 0516 01/18/18 0609 01/19/18 0702  Weight: 233 lb 12.8 oz (106.1 kg) 230 lb 6.1 oz (104.5 kg) 223 lb 1.7 oz (101.2 kg)    Telemetry    Sinus rhythm, sinus bradycardia.  Rate 50s-60s.  PVCs - Personally Reviewed  ECG    Sinus rhythm.  PVCs.  LBBB. LAFB.   - Personally Reviewed  Physical Exam   VS:  BP (!) 159/66 (BP Location: Left Arm)   Pulse 61   Temp 98.3  F (36.8 C) (Oral)   Resp 18   Ht 5\' 11"  (1.803 m)   Wt 223 lb 1.7 oz (101.2 kg)   SpO2 96%   BMI 31.12 kg/m   , BMI Body mass index is 31.12 kg/m. GENERAL:  Well appearing HEENT: Pupils equal round and reactive, fundi not visualized, oral mucosa unremarkable NECK: JVP 1 cm above clavicle at 45 degrees, waveform within normal limits, carotid upstroke brisk and symmetric, no bruits, no thyromegaly LYMPHATICS:  No cervical adenopathy LUNGS:  Clear to auscultation bilaterally HEART:  RRR.  PMI not displaced or sustained,S1 and S2 within normal limits, no S3, no S4, no clicks, no rubs, no murmurs ABD:  Flat, positive bowel sounds normal in frequency in pitch, no bruits, no rebound, no guarding, no midline pulsatile mass, no hepatomegaly, no splenomegaly EXT:  Unna boots in place. Unable to assess edema but legs and feet are much smaller.  SKIN:  No rashes no nodules NEURO:  Cranial nerves II through XII grossly intact, motor grossly intact throughout PSYCH:  Cognitively intact, oriented to person place and time   Labs    Chemistry Recent Labs  Lab 01/16/18 1118  01/16/18 1404 01/17/18 0151 01/18/18 0617 01/19/18 0617  NA 124*  --  126* 128* 131* 136  K 3.9  --  3.8 3.3* 3.9 4.3  CL 91*  --  96* 94* 96* 97*  CO2 21  --  24 24 25 28   GLUCOSE 243*  --  207* 180* 189* 284*  BUN 33*  --  34* 38* 40* 48*  CREATININE 1.71*   < > 1.54* 1.67* 1.81* 1.83*  CALCIUM 8.6  --  8.6* 8.6* 8.7* 9.0  PROT 6.4  --  7.3  --   --   --   ALBUMIN  --   --  3.9  --   --   --   AST 27  --  29  --   --   --   ALT 20  --  27  --   --   --   ALKPHOS  --   --  56  --   --   --   BILITOT 0.5  --  0.5  --   --   --   GFRNONAA 34*   < > 38* 34* 31* 31*  GFRAA 40*   < > 44* 40* 36* 36*  ANIONGAP  --    < > 6 10 10 11    < > = values in this interval not displayed.     Hematology Recent Labs  Lab 01/16/18 1118 01/16/18 1404  WBC 6.1 5.9  RBC 3.76* 3.97*  HGB 11.4* 11.5*  HCT 31.5* 34.0*  MCV  83.8 85.6  MCH 30.3 29.0  MCHC 36.2* 33.8  RDW 12.5 13.4  PLT 211 246    Cardiac Enzymes Recent Labs  Lab 01/16/18 1404 01/16/18 2003 01/17/18 0151 01/17/18 0807  TROPONINI 0.08* 0.08* 0.09* 0.09*   No results for input(s): TROPIPOC in the last 168 hours.   BNP Recent Labs  Lab 01/16/18 1118 01/16/18 1404  BNP 599* 552.9*     DDimer No results for input(s): DDIMER in the last 168 hours.   Radiology    No results found.  Cardiac Studies   Echo 01/17/18:  Study Conclusions  - Left ventricle: The cavity size was normal. There was moderate   concentric hypertrophy. Systolic function was normal. The   estimated ejection fraction was in the range of 60% to 65%. Wall   motion was normal; there were no regional wall motion   abnormalities. Features are consistent with a pseudonormal left   ventricular filling pattern, with concomitant abnormal relaxation   and increased filling pressure (grade 2 diastolic dysfunction). - Aortic valve: Trileaflet; moderately thickened, moderately   calcified leaflets. - Aorta: Aortic root dimension: 45 mm (ED) at sinus of Valsalva.   Remainder of root is normal. - Ascending aorta: The ascending aorta was mildly dilated. - Mitral valve: Calcified annulus. Mildly thickened leaflets .   There was mild regurgitation.  Patient Profile     84 yo male with PMH of HTN, CKD, IDDM, HL and bradycardia who presented to with worsening shortness of breath with acute on chronic HF.  Assessment & Plan    # Acute on chronic diastolic heart failure:  LVEF 60-65% with grade 2 diastolic dysfunction on echo this admission.  His weight continues to go down.  He was weighed down 7 pounds from yesterday.  I suspect that this is  not accurate.  He was net -2.4 L of the last 24 hours.  Creatinine is stable but BUN is starting to rise.  We will switch Lasix to 80 mg po twice daily from 80 mg IV 3 times daily.  He was drinking a gallon of water daily prior to  admission in an attempt to help lower his glucose.  # Hypertensive heart disease: # Bradycardia: BP remains poorly controlled.  Amlodipine was increased to 10 mg.  We cannot titrate carvedilol secondary to bradycardia.  Continue hydralazine.  Renal function is above baseline so we will not had an ACE inhibitor or ARB.  Add clonidine 0.1 mg twice daily.    # Mild ascending aorta aneurysm: Given age no need for serial surveillance.         For questions or updates, please contact [place] Please consult [email or web address] for contact info under Cardiology/STEMI.      Signed, Skeet Latch, MD  01/19/2018, 12:27 PM

## 2018-01-20 ENCOUNTER — Other Ambulatory Visit: Payer: Self-pay | Admitting: Internal Medicine

## 2018-01-20 DIAGNOSIS — I5030 Unspecified diastolic (congestive) heart failure: Secondary | ICD-10-CM

## 2018-01-20 LAB — BASIC METABOLIC PANEL
ANION GAP: 11 (ref 5–15)
BUN: 46 mg/dL — ABNORMAL HIGH (ref 8–23)
CHLORIDE: 98 mmol/L (ref 98–111)
CO2: 29 mmol/L (ref 22–32)
Calcium: 9 mg/dL (ref 8.9–10.3)
Creatinine, Ser: 1.84 mg/dL — ABNORMAL HIGH (ref 0.61–1.24)
GFR calc Af Amer: 36 mL/min — ABNORMAL LOW (ref >60)
GFR, EST NON AFRICAN AMERICAN: 31 mL/min — AB (ref >60)
GLUCOSE: 185 mg/dL — AB (ref 70–99)
POTASSIUM: 4.1 mmol/L (ref 3.5–5.1)
SODIUM: 138 mmol/L (ref 135–145)

## 2018-01-20 LAB — GLUCOSE, CAPILLARY
GLUCOSE-CAPILLARY: 190 mg/dL — AB (ref 70–99)
Glucose-Capillary: 235 mg/dL — ABNORMAL HIGH (ref 70–99)

## 2018-01-20 MED ORDER — CLONIDINE HCL 0.1 MG PO TABS: 0.1000 mg | ORAL_TABLET | Freq: Two times a day (BID) | ORAL | 0 refills | Status: DC

## 2018-01-20 MED ORDER — INSULIN DETEMIR 100 UNIT/ML FLEXPEN: 20.0000 [IU] | PEN_INJECTOR | Freq: Every day | SUBCUTANEOUS | 0 refills | Status: DC

## 2018-01-20 MED ORDER — FUROSEMIDE 40 MG PO TABS: 60.0000 mg | ORAL_TABLET | Freq: Two times a day (BID) | ORAL | 0 refills | Status: DC

## 2018-01-20 MED ORDER — AMLODIPINE BESYLATE 10 MG PO TABS: 10.0000 mg | ORAL_TABLET | Freq: Every day | ORAL | 0 refills | Status: DC

## 2018-01-20 MED ORDER — POTASSIUM CHLORIDE CRYS ER 20 MEQ PO TBCR: 20.0000 meq | EXTENDED_RELEASE_TABLET | Freq: Every day | ORAL | 0 refills | Status: DC

## 2018-01-20 NOTE — Progress Notes (Signed)
Progress Note  Patient Name: Gregory Hatfield Date of Encounter: 01/20/2018  Primary Cardiologist: No primary care provider on file.   Subjective   Doing well eating breakfast   Inpatient Medications    Scheduled Meds: . amLODipine  10 mg Oral Daily  . aspirin EC  81 mg Oral Daily  . brimonidine  1 drop Both Eyes TID  . brinzolamide  1 drop Both Eyes TID  . carvedilol  6.25 mg Oral BID WC  . cloNIDine  0.1 mg Oral BID  . dextromethorphan-guaiFENesin  1 tablet Oral BID  . diclofenac sodium  2 g Topical QID  . furosemide  80 mg Oral BID  . heparin  5,000 Units Subcutaneous Q8H  . hydrALAZINE  100 mg Oral TID  . insulin aspart  0-15 Units Subcutaneous TID WC  . insulin aspart  0-5 Units Subcutaneous QHS  . insulin glargine  20 Units Subcutaneous Daily  . latanoprost  1 drop Both Eyes QHS  . mirabegron ER  50 mg Oral Daily  . polyethylene glycol  17 g Oral Daily  . potassium chloride  20 mEq Oral BID  . senna-docusate  1 tablet Oral QHS   Continuous Infusions:  PRN Meds: acetaminophen   Vital Signs    Vitals:   01/19/18 1359 01/19/18 1801 01/19/18 2022 01/20/18 0526  BP: (!) 158/74 140/63 (!) 142/62 (!) 155/67  Pulse: (!) 57 (!) 54 (!) 56 (!) 54  Resp:   18 18  Temp: 98 F (36.7 C)  97.8 F (36.6 C) (!) 97.5 F (36.4 C)  TempSrc: Oral  Oral Oral  SpO2: 97%  96% 95%  Weight:      Height:        Intake/Output Summary (Last 24 hours) at 01/20/2018 0853 Last data filed at 01/20/2018 0058 Gross per 24 hour  Intake -  Output 2375 ml  Net -2375 ml   Filed Weights   01/17/18 0516 01/18/18 0609 01/19/18 0702  Weight: 233 lb 12.8 oz (106.1 kg) 230 lb 6.1 oz (104.5 kg) 223 lb 1.7 oz (101.2 kg)    Telemetry    SR PVCls stable 01/20/2018   ECG    Sinus rhythm.  PVCs.  LBBB. LAFB.   - Personally Reviewed  Physical Exam   BP (!) 155/67 (BP Location: Right Arm)   Pulse (!) 54   Temp (!) 97.5 F (36.4 C) (Oral)   Resp 18   Ht 5\' 11"  (1.803 m)   Wt 223 lb  1.7 oz (101.2 kg)   SpO2 95%   BMI 31.12 kg/m  Affect appropriate Elderly black male  HEENT: normal Neck supple with no adenopathy JVP normal no bruits no thyromegaly Lungs clear with no wheezing and good diaphragmatic motion Heart:  S1/S2 no murmur, no rub, gallop or click PMI normal Abdomen: benighn, BS positve, no tenderness, no AAA  Foley  no bruit.  No HSM or HJR Distal pulses intact with no bruits Plus 2 edema both legs wrapped  Neuro non-focal Skin warm and dry No muscular weakness    Labs    Chemistry Recent Labs  Lab 01/16/18 1118 01/16/18 1404  01/18/18 0617 01/19/18 0617 01/20/18 0515  NA 124* 126*   < > 131* 136 138  K 3.9 3.8   < > 3.9 4.3 4.1  CL 91* 96*   < > 96* 97* 98  CO2 21 24   < > 25 28 29   GLUCOSE 243* 207*   < >  189* 284* 185*  BUN 33* 34*   < > 40* 48* 46*  CREATININE 1.71* 1.54*   < > 1.81* 1.83* 1.84*  CALCIUM 8.6 8.6*   < > 8.7* 9.0 9.0  PROT 6.4 7.3  --   --   --   --   ALBUMIN  --  3.9  --   --   --   --   AST 27 29  --   --   --   --   ALT 20 27  --   --   --   --   ALKPHOS  --  56  --   --   --   --   BILITOT 0.5 0.5  --   --   --   --   GFRNONAA 34* 38*   < > 31* 31* 31*  GFRAA 40* 44*   < > 36* 36* 36*  ANIONGAP  --  6   < > 10 11 11    < > = values in this interval not displayed.     Hematology Recent Labs  Lab 01/16/18 1118 01/16/18 1404  WBC 6.1 5.9  RBC 3.76* 3.97*  HGB 11.4* 11.5*  HCT 31.5* 34.0*  MCV 83.8 85.6  MCH 30.3 29.0  MCHC 36.2* 33.8  RDW 12.5 13.4  PLT 211 246    Cardiac Enzymes Recent Labs  Lab 01/16/18 1404 01/16/18 2003 01/17/18 0151 01/17/18 0807  TROPONINI 0.08* 0.08* 0.09* 0.09*   No results for input(s): TROPIPOC in the last 168 hours.   BNP Recent Labs  Lab 01/16/18 1118 01/16/18 1404  BNP 599* 552.9*     DDimer No results for input(s): DDIMER in the last 168 hours.   Radiology    No results found.  Cardiac Studies   Echo 01/17/18:  Study Conclusions  - Left  ventricle: The cavity size was normal. There was moderate   concentric hypertrophy. Systolic function was normal. The   estimated ejection fraction was in the range of 60% to 65%. Wall   motion was normal; there were no regional wall motion   abnormalities. Features are consistent with a pseudonormal left   ventricular filling pattern, with concomitant abnormal relaxation   and increased filling pressure (grade 2 diastolic dysfunction). - Aortic valve: Trileaflet; moderately thickened, moderately   calcified leaflets. - Aorta: Aortic root dimension: 45 mm (ED) at sinus of Valsalva.   Remainder of root is normal. - Ascending aorta: The ascending aorta was mildly dilated. - Mitral valve: Calcified annulus. Mildly thickened leaflets .   There was mild regurgitation.  Patient Profile     84 yo male with PMH of HTN, CKD, IDDM, HL and bradycardia who presented to with worsening shortness of breath with acute on chronic HF.  Assessment & Plan    # Acute on chronic diastolic heart failure:  Improved changed to PO lasix   # Hypertensive heart disease: Improved with clonidine and hyralazine   # Mild ascending aorta aneurysm: Given age no need for serial surveillance.     Jenkins Rouge  01/20/2018, 8:53 AM

## 2018-01-20 NOTE — Discharge Summary (Signed)
Physician Discharge Summary  Gregory Hatfield VZC:[id number] DOB: 1925-07-25 DOA: 01/16/2018  PCP: [name], MD  Admit date: 01/16/2018 Discharge date: 01/20/2018  Admitted From: Home Disposition:  Home  Discharge Condition:Stable CODE STATUS:FULL Diet recommendation: Heart Healthy  Brief/Interim Summary: Gregory Hatfield a 84 y.o.malewith medical history significant of diastolic CHF, CKD stage IV, hypertension, hyperlipidemia who presents from home for evaluation of increased shortness of breath, worsening lower extremity edema. He is on Lasix at home. Patient and family reports that his bilateral lower extremity edema has been worsening and he has been having increased shortness of breath on minimal exertion. Also reported of cough. Patient was seen at his cardiologist's office. He was recommended to take 60 mg of Lasix twice a day and was sent home. On the way back to home he became very short of breath and had to be brought to the emergency department. Family reported that there was improvement of his weight by 15 pounds in last 4 days. Patient takes his daily weight. As per the daughter he has been having increase water intake recently. He takes 2 glasses of water during his each meal. When patient arrived in the emergency department, his oxygen saturation was 90%. Patient admitted for the management of CHF exacerbation. Patient is started on IV Lasix.  Patient had significant diuresis during his hospital stay.  Cardiology was also following.  Currently his respiratory status is stable.  Lower extremity edema has improved.  He is stable to be discharged home today with oral Lasix.  He will be arranged heart failure home health services.  Following problems were addressed during his hospitalization:  Acute on chronic diastolic JOI:[facility] with worsening shortness of breath. Presented with severe bilateral lower extreme edema with blister formation and ulcer on the  left lower extremity. Elevated BNP. Chest x-ray was suggestive of increased interstitial edema. Cardiology following. Decreased  Lasix to 80 mg ,2 times a day Echocardiogram showed ejection fraction of 60 to 65%, no wall motion abnormality, grade 2 diastolic dysfunction  Patient has been advised to take less than 1200 mL of fluid a day and less than 2 g of salt a day at home.  He will be discharged on Lasix 60 mg twice a day at home. Arrangement  of home health for hear failure done.  Hyponatremia: Most likely secondary to hypervolemic hyponatremia secondary to CHF. Resolved.  Severe bilateral lower extremity edema/venous stasis ulcer: Had ulceration on left lower extremity secondary to severe edema. Wound care was following. Will elevate the lower extremities.  Abdominal distention: Ultrasound and CT imaging did not show any acute pathology or free fluid.  Showed mostly fat.  No bowel obstruction  CKD stage NO:BSJGGEZMO kidney function around  baseline. We will continue to monitor. Patient follows with nephrology.  Elevated troponin: Most likely secondary to supply and demand ischemia. Troponins flat. Patient denies any chest pain.  Diabetes mellitus type 2: On insulin at home.   History of hypertension: Resumed  his home meds.  Debility/deconditioning:Physical therapy evaluation done,recommended home PT    Discharge Diagnoses:  Principal Problem:   CHF (congestive heart failure) (HCC) Active Problems:   Essential hypertension   CKD stage 4 due to type 2 diabetes mellitus (HCC)   Diabetes mellitus with complication (HCC)   Acute on chronic diastolic heart failure (HCC)   Hyponatremia   Peripheral edema   Shortness of breath    Discharge Instructions  Discharge Instructions    Ambulatory referral to Cardiology   Complete  by:  As directed    2 weeks.   Diet - low sodium heart healthy   Complete by:  As directed    Discharge instructions   Complete  by:  As directed    1) Take prescribed medications as instructed. 2) Follow up with your PCP in a week.  CBC and BMP test during the follow-up. 3) Take less than 1200 mL of fluid a day and less than 2 g of salt a day. 4) Follow up with cardiology as an outpatient.   Increase activity slowly   Complete by:  As directed      Allergies as of 01/20/2018      Reactions   Ace Inhibitors Other (See Comments), Cough   Chronic cough   Demerol Other (See Comments)   Hard to wake up   Cosopt [dorzolamide Hcl-timolol Mal] Other (See Comments)   Cause heart rate to drop   Flomax [tamsulosin Hcl] Other (See Comments)   Low BP   Morphine And Related Other (See Comments)   hallucinations   Oxytrol [oxybutynin] Rash      Medication List    TAKE these medications   amLODipine 10 MG tablet Commonly known as:  NORVASC Take 1 tablet (10 mg total) by mouth daily. Start taking on:  01/21/2018 What changed:    medication strength  how much to take   aspirin 81 MG tablet Take 81 mg by mouth daily.   B-D UF III MINI PEN NEEDLES 31G X 5 MM Misc Generic drug:  Insulin Pen Needle USE FOR INJECTIONS AS DIRECTED   carvedilol 6.25 MG tablet Commonly known as:  COREG TAKE 1 TABLET BY MOUTH TWICE DAILY WITH A MEAL.   cloNIDine 0.1 MG tablet Commonly known as:  CATAPRES Take 1 tablet (0.1 mg total) by mouth 2 (two) times daily.   dextromethorphan-guaiFENesin 30-600 MG 12hr tablet Commonly known as:  MUCINEX DM Take 1 tablet by mouth 2 (two) times daily.   furosemide 40 MG tablet Commonly known as:  LASIX Take 1.5 tablets (60 mg total) by mouth 2 (two) times daily. What changed:  See the new instructions.   hydrALAZINE 100 MG tablet Commonly known as:  APRESOLINE Take 1 tablet (100 mg total) by mouth 3 (three) times daily. KEEP OV.   LEVEMIR FLEXTOUCH 100 UNIT/ML Pen Generic drug:  Insulin Detemir INJECT 50 UNITS IN THE AM AND 25 UNITS IN THE PM   LUMIGAN 0.01 % Soln Generic drug:   bimatoprost Place 1 drop into the right eye at bedtime.   multivitamin tablet Take 1 tablet by mouth daily.   MYRBETRIQ 50 MG Tb24 tablet Generic drug:  mirabegron ER Take 50 mg by mouth daily.   ONE TOUCH ULTRA TEST test strip Generic drug:  glucose blood CHECK BLOOD SUGAR 6 TIMES A DAY.   potassium chloride SA 20 MEQ tablet Commonly known as:  K-DUR,KLOR-CON Take 1 tablet (20 mEq total) by mouth daily.   PROBIOTIC DAILY PO Take 1 tablet by mouth daily.   senna-docusate 8.6-50 MG tablet Commonly known as:  Senokot-S Take 1 tablet by mouth at bedtime. What changed:    when to take this  reasons to take this   SIMBRINZA 1-0.2 % Susp Generic drug:  Brinzolamide-Brimonidine Place 1 drop into both eyes 3 (three) times daily.   Vitamin D3 5000 units Caps Take 5,000 Units by mouth 2 (two) times daily.            Durable Medical Equipment  (  From admission, onward)        Start     Ordered   01/19/18 1208  Heart failure home health orders  (Heart failure home health orders / Face to face)  Once    Comments:  Heart Failure Follow-up Care:  Verify follow-up appointments per Patient Discharge Instructions. Confirm transportation arranged. Reconcile home medications with discharge medication list. Remove discontinued medications from use. Assist patient/caregiver to manage medications using pill box. Reinforce low sodium food selection Assessments: Vital signs and oxygen saturation at each visit. Assess home environment for safety concerns, caregiver support and availability of low-sodium foods. Consult Education officer, museum, PT/OT, Dietitian, and CNA based on assessments. Perform comprehensive cardiopulmonary assessment. Notify MD for any change in condition or weight gain of 3 pounds in one day or 5 pounds in one week with symptoms. Daily Weights and Symptom Monitoring: Ensure patient has access to scales. Teach patient/caregiver to weigh daily before breakfast and after  voiding using same scale and record.    Teach patient/caregiver to track weight and symptoms and when to notify Provider. Activity: Develop individualized activity plan with patient/caregiver.   Question Answer Comment  Heart Failure Follow-up Care [facility] at [phone]   Obtain the following labs Basic Metabolic Panel   Lab frequency Weekly   Fax lab results to [facility] at [phone]   Diet Low Sodium Heart Healthy   Fluid restrictions: 1200 mL Fluid      01/19/18 1209     Follow-up Information    Home, [facility] Follow up.   Specialty:  [place] Why:  [facility] nursing-CH[facility] Contact information: [address] [place] [address] [phone]        [name], MD. Schedule an appointment as soon as possible for a visit in 1 week(s).   Specialty:  Internal Medicine Contact information: [address] [place]  [address] [phone]          Allergies  Allergen Reactions  . Ace Inhibitors Other (See Comments) and Cough    Chronic cough  . Demerol Other (See Comments)    Hard to wake up  . Cosopt [Dorzolamide Hcl-Timolol Mal] Other (See Comments)    Cause heart rate to drop  . Flomax [Tamsulosin Hcl] Other (See Comments)    Low BP  . Morphine And Related Other (See Comments)    hallucinations  . Oxytrol [Oxybutynin] Rash    Consultations: Cardiology  Procedures/Studies: Ct Abdomen Pelvis Wo Contrast  Result Date: 01/17/2018 CLINICAL DATA:  Abdominal distension over the last year. Worsening lower extremity edema. EXAM: CT ABDOMEN AND PELVIS WITHOUT CONTRAST TECHNIQUE: Multidetector CT imaging of the abdomen and pelvis was performed following the standard protocol without IV contrast. COMPARISON:  Ultrasound same day FINDINGS: Lower chest: Bilateral pleural effusions layering dependently with dependent pulmonary atelectasis. Mild cardiomegaly. No visible  pericardial fluid. Hepatobiliary: Nonspecific 12 mm low-density in the right lobe of the liver image 17. Otherwise no abnormal liver finding. No calcified gallstones. Pancreas: Normal Spleen: Normal Adrenals/Urinary Tract: Right adrenal gland is normal. 2.3 cm left adrenal mass with density is low OS 15 Hounsfield units. Second smaller mass in the left adrenal gland measuring 11 mm of similar density. Kidneys appear normal without contrast. No bladder abnormality is seen. Stomach/Bowel: Fairly large amount of gas within the intestine but without an obstructive pattern. No focal bowel lesion is seen. There is a periumbilical ventral hernia containing only fat. Vascular/Lymphatic: Aortic  atherosclerosis. No aneurysm. IVC is normal. No retroperitoneal adenopathy. Reproductive: Normal Other: No free fluid or air. Musculoskeletal: Chronic postsurgical and degenerative changes of the spine. IMPRESSION: No ascites. Large amount of intra-abdominal fat. Moderate amount of gas in the intestine without obstructive pattern. Small periumbilical ventral hernia containing only fat. Two adrenal masses on the left, the larger measuring 2.3 cm in diameter with density as low as 15 Hounsfield units. These are likely to represent benign adenomas. Aortic atherosclerosis. Electronically Signed   By: Nelson Chimes M.D.   On: 01/17/2018 11:40   Dg Chest 2 View  Result Date: 01/16/2018 CLINICAL DATA:  Shortness of breath with exertion, worsening recently. History of congestive heart failure. EXAM: CHEST - 2 VIEW COMPARISON:  10/06/2016. FINDINGS: Cardiomegaly. Aortic atherosclerosis. Pulmonary venous hypertension. Mild interstitial edema. Small effusions layering dependently. No acute bone finding. IMPRESSION: Congestive heart failure. Cardiomegaly. Pulmonary venous hypertension. Interstitial edema. Small effusions. Electronically Signed   By: Nelson Chimes M.D.   On: 01/16/2018 13:12   US Abdomen Limited  Result Date:  01/17/2018 CLINICAL DATA:  Evaluate for ascites.  Distended abdomen EXAM: LIMITED ABDOMEN ULTRASOUND FOR ASCITES TECHNIQUE: Limited ultrasound survey for ascites was performed in all four abdominal quadrants. COMPARISON:  None. FINDINGS: Surveillance images show no ascites. No incidental fluid dilated bowel or gross mass lesion. IMPRESSION: Negative for ascites. Electronically Signed   By: Monte Fantasia M.D.   On: 01/17/2018 07:27       Subjective: Patient seen and examined the pressure this morning.  Remains comfortable.  No new issues/events.  Stable for discharge home today.  Discharge Exam: Vitals:   01/19/18 2022 01/20/18 0526  BP: (!) 142/62 (!) 155/67  Pulse: (!) 56 (!) 54  Resp: 18 18  Temp: 97.8 F (36.6 C) (!) 97.5 F (36.4 C)  SpO2: 96% 95%   Vitals:   01/19/18 1359 01/19/18 1801 01/19/18 2022 01/20/18 0526  BP: (!) 158/74 140/63 (!) 142/62 (!) 155/67  Pulse: (!) 57 (!) 54 (!) 56 (!) 54  Resp:   18 18  Temp: 98 F (36.7 C)  97.8 F (36.6 C) (!) 97.5 F (36.4 C)  TempSrc: Oral  Oral Oral  SpO2: 97%  96% 95%  Weight:      Height:        General: Pt is alert, awake, not in acute distress,obese Cardiovascular: RRR, S1/S2 +, no rubs, no gallops Respiratory: CTA bilaterally, no wheezing, no rhonchi Abdominal: Soft, NT, ND, bowel sounds + Extremities: 1+  Edema on bilateral lower extremities, no cyanosis    The results of significant diagnostics from this hospitalization (including imaging, microbiology, ancillary and laboratory) are listed below for reference.     Microbiology: No results found for this or any previous visit (from the past 240 hour(s)).   Labs: BNP (last 3 results) Recent Labs    01/16/18 1118 01/16/18 1404  BNP 599* 761.9*   Basic Metabolic Panel: Recent Labs  Lab 01/16/18 1404 01/17/18 0151 01/18/18 0617 01/19/18 0617 01/20/18 0515  NA 126* 128* 131* 136 138  K 3.8 3.3* 3.9 4.3 4.1  CL 96* 94* 96* 97* 98  CO2 24 24 25 28 29    GLUCOSE 207* 180* 189* 284* 185*  BUN 34* 38* 40* 48* 46*  CREATININE 1.54* 1.67* 1.81* 1.83* 1.84*  CALCIUM 8.6* 8.6* 8.7* 9.0 9.0   Liver Function Tests: Recent Labs  Lab 01/16/18 1118 01/16/18 1404  AST 27 29  ALT 20 27  ALKPHOS  --  56  BILITOT 0.5 0.5  PROT 6.4 7.3  ALBUMIN  --  3.9   No results for input(s): LIPASE, AMYLASE in the last 168 hours. No results for input(s): AMMONIA in the last 168 hours. CBC: Recent Labs  Lab 01/16/18 1118 01/16/18 1404  WBC 6.1 5.9  NEUTROABS 4,386 4.3  HGB 11.4* 11.5*  HCT 31.5* 34.0*  MCV 83.8 85.6  PLT 211 246   Cardiac Enzymes: Recent Labs  Lab 01/16/18 1404 01/16/18 2003 01/17/18 0151 01/17/18 0807  TROPONINI 0.08* 0.08* 0.09* 0.09*   BNP: Invalid input(s): POCBNP CBG: Recent Labs  Lab 01/19/18 0754 01/19/18 1143 01/19/18 1626 01/19/18 2020 01/20/18 0721  GLUCAP 249* 303* 247* 191* 190*   D-Dimer No results for input(s): DDIMER in the last 72 hours. Hgb A1c No results for input(s): HGBA1C in the last 72 hours. Lipid Profile No results for input(s): CHOL, HDL, LDLCALC, TRIG, CHOLHDL, LDLDIRECT in the last 72 hours. Thyroid function studies No results for input(s): TSH, T4TOTAL, T3FREE, THYROIDAB in the last 72 hours.  Invalid input(s): FREET3 Anemia work up No results for input(s): VITAMINB12, FOLATE, FERRITIN, TIBC, IRON, RETICCTPCT in the last 72 hours. Urinalysis    Component Value Date/Time   COLORURINE YELLOW 01/16/2018 1118   APPEARANCEUR CLEAR 01/16/2018 1118   LABSPEC 1.018 01/16/2018 1118   PHURINE 5.5 01/16/2018 1118   GLUCOSEU 1+ (A) 01/16/2018 1118   HGBUR NEGATIVE 01/16/2018 1118   BILIRUBINUR NEGATIVE 10/06/2016 1249   KETONESUR NEGATIVE 01/16/2018 1118   PROTEINUR 1+ (A) 01/16/2018 1118   UROBILINOGEN 0.2 05/05/2015 1023   NITRITE NEGATIVE 01/16/2018 1118   LEUKOCYTESUR NEGATIVE 01/16/2018 1118   Sepsis Labs Invalid input(s): PROCALCITONIN,  WBC,  LACTICIDVEN Microbiology No  results found for this or any previous visit (from the past 240 hour(s)).  Please note: You were cared for by a hospitalist during your hospital stay. Once you are discharged, your primary care physician will handle any further medical issues. Please note that NO REFILLS for any discharge medications will be authorized once you are discharged, as it is imperative that you return to your primary care physician (or establish a relationship with a primary care physician if you do not have one) for your post hospital discharge needs so that they can reassess your need for medications and monitor your lab values.    Time coordinating discharge: 40 minutes  SIGNED:   Shelly Coss, MD  [facility] 01/20/2018, 10:57 AM Pager [phone]  If 7PM-7AM, please contact night-coverage [email or web address] Password [email or web address]

## 2018-01-20 NOTE — Progress Notes (Signed)
Report received from R. Corcoran,RN. No change in assessment.Gregory Hatfield

## 2018-01-22 ENCOUNTER — Telehealth: Payer: Self-pay | Admitting: *Deleted

## 2018-01-22 NOTE — Telephone Encounter (Signed)
Called patient on 01/22/2018 , 3:42 PM in an attempt to reach the patient for a hospital follow up.   Admit date: 01/16/18 Discharge: 01/20/18   He does not  have any questions or concerns about medications from the hospital admission. The patient's medications were reviewed over the phone, they were counseled to bring in all current medications to the hospital follow up visit.   I advised the patient to call if any questions or concerns arise about the hospital admission or medications    Home health was  started in the hospital.  All questions were answered and a follow up appointment was made.   Prior to Admission medications   Medication Sig Start Date End Date Taking? Authorizing Provider  amLODipine (NORVASC) 10 MG tablet Take 1 tablet (10 mg total) by mouth daily. 01/21/18   Shelly Coss, MD  aspirin 81 MG tablet Take 81 mg by mouth daily.    [provider]  B-D UF III MINI PEN NEEDLES 31G X 5 MM MISC USE FOR INJECTIONS AS DIRECTED 12/27/17   Renato Shin, MD  carvedilol (COREG) 6.25 MG tablet TAKE 1 TABLET BY MOUTH TWICE DAILY WITH A MEAL. 01/20/18   [name], MD  Cholecalciferol (VITAMIN D3) 5000 UNITS CAPS Take 5,000 Units by mouth 2 (two) times daily.     [provider]  cloNIDine (CATAPRES) 0.1 MG tablet Take 1 tablet (0.1 mg total) by mouth 2 (two) times daily. 01/20/18   Shelly Coss, MD  furosemide (LASIX) 40 MG tablet Take 1.5 tablets (60 mg total) by mouth 2 (two) times daily. 01/20/18 02/19/18  Shelly Coss, MD  hydrALAZINE (APRESOLINE) 100 MG tablet Take 1 tablet (100 mg total) by mouth 3 (three) times daily. KEEP OV. 12/27/17   Almyra Deforest, PA  Insulin Detemir (LEVEMIR FLEXTOUCH) 100 UNIT/ML Pen Inject 20 Units into the skin daily. 01/20/18   Shelly Coss, MD  LUMIGAN 0.01 % SOLN Place 1 drop into the right eye at bedtime.  03/29/13   [provider]  Multiple Vitamin (MULTIVITAMIN) tablet Take 1 tablet by mouth daily.      [provider]  MYRBETRIQ 50 MG TB24 tablet Take 50 mg by mouth daily.  12/10/14   [provider]  ONE TOUCH ULTRA TEST test strip CHECK BLOOD SUGAR 6 TIMES A DAY. 02/12/17   Renato Shin, MD  potassium chloride SA (K-DUR,KLOR-CON) 20 MEQ tablet Take 1 tablet (20 mEq total) by mouth daily. 01/20/18 02/19/18  Shelly Coss, MD  Probiotic Product (PROBIOTIC DAILY PO) Take 1 tablet by mouth daily.     [provider]  senna-docusate (SENOKOT-S) 8.6-50 MG tablet Take 1 tablet by mouth at bedtime. Patient taking differently: Take 1 tablet by mouth as needed.  05/09/15   Eugenie Filler, MD  SIMBRINZA 1-0.2 % SUSP Place 1 drop into both eyes 3 (three) times daily. 02/28/13   [provider]

## 2018-01-23 ENCOUNTER — Ambulatory Visit: Payer: Medicare Other | Admitting: Podiatry

## 2018-01-24 NOTE — Progress Notes (Signed)
Hospital follow up  Assessment and Plan: Hospital visit follow up for: acute on chronic CHF Hospital discharge meds were reviewed, and reconciled with the patient.    Acute on chronic diastolic heart failure (HCC) Resolved to baseline Weights back to baseline; BP log from home demonstrates at goal values Continue with lasix 60 mg BID at this time; continue daily potassium supplement Emphasized limiting fluid intake to 40 ounces daily and 2 g of sodium Goal weight is 215 lb - call if gains more than 2 lb overnight and 5 lb in 1 week Followed by home health heart failure team at this time -  will reach out to Dr. Debara Pickett and schedule follow up as recommended  CKD stage 4 due to type 2 diabetes mellitus ([place]) Continue follow up with nephrology -     BASIC METABOLIC PANEL WITH GFR  Peripheral edema Significantly improved; cannot determine current extent due to compression dressing  Hyponatremia -     BASIC METABOLIC PANEL WITH GFR  Venous stasis ulcer of other part of left lower leg limited to breakdown of skin without varicose veins (HCC) Followed by home wound care; follow up as recommended -     CBC with Differential/Platelet  There are no discontinued medications.  Over 40 minutes of exam, counseling, chart review, and complex, high/moderate level critical decision making was performed this visit.   Future Appointments  Date Time Provider [place]  03/22/2018 11:00 AM [name], MD [facility] None  04/13/2018 11:15 AM Renato Shin, MD [facility] None    HPI 84 y.o.male presents for follow up for transition from recent hospitalization. Admit date to the hospital was 01/16/18, patient was discharged from the hospital on 01/20/18 and our clinical staff contacted the office the day after discharge to set up a follow up appointment. The discharge summary, medications, and diagnostic test results were reviewed before meeting with the patient. The patient was admitted  for: acute on chronic CHF.   Gregory Highfill Barrettis a 84 y.o.malewith medical history significant of diastolic CHF, CKD stage IV, hypertension, hyperlipidemia who presented to the ER after he became dyspneic on the way home from an OV here at which time increased dyspnea, LE edema and 12+ lb weight gain in previous week was noted. Lasix was increased and close cardiology follow up was scheduled, but patient became dyspneic and presented to ER. When patient arrived in the emergency department, his oxygen saturation was 90%.  Patient admitted for the management of CHF exacerbation, ECHO showed grade 2 diastolic dysfunction, EF 85-46%. Patient was started on IV Lasix with significant diuresis during his hospital stay, weight down from #236 > #223, and was discharged on oral lasix 60 mg BID and arrangements for heart failure home health services and PT  He is recommended to limit to 1200 ml of fluid and <2 g sodium at home.   He was noted to be hyponatremic, sodium 124 on 7/30, thought be to hypervolemic hyponatremia secondary to CHF and resolved prior to discharge, sodium 138 on 8/3. Will follow up today.  He presents today with a log of BPs demonstrating home values ranging 130-140s/60s, weight is down to #216. Home health is coming nearly daily for BP, weights, med management and he is doing well;   Had ulceration on left lower extremity secondary to severe edema.Wound care was following while hospitalized, and he reports the area is doing well; unable to visualize as bilateral LE are wrapped for compression.   Patient has CKD IV,  GFR remained at or above baseline of 31 throughout hospitalization. Patient follows with nephrology.  Wt Readings from Last 3 Encounters:  01/25/18 216 lb 12.8 oz (98.3 kg)  01/19/18 223 lb 1.7 oz (101.2 kg)  01/16/18 240 lb 9.6 oz (109.1 kg)    Images while in the hospital: Ct Abdomen Pelvis Wo Contrast  Result Date: 01/17/2018 CLINICAL DATA:  Abdominal distension  over the last year. Worsening lower extremity edema. EXAM: CT ABDOMEN AND PELVIS WITHOUT CONTRAST TECHNIQUE: Multidetector CT imaging of the abdomen and pelvis was performed following the standard protocol without IV contrast. COMPARISON:  Ultrasound same day FINDINGS: Lower chest: Bilateral pleural effusions layering dependently with dependent pulmonary atelectasis. Mild cardiomegaly. No visible pericardial fluid. Hepatobiliary: Nonspecific 12 mm low-density in the right lobe of the liver image 17. Otherwise no abnormal liver finding. No calcified gallstones. Pancreas: Normal Spleen: Normal Adrenals/Urinary Tract: Right adrenal gland is normal. 2.3 cm left adrenal mass with density is low OS 15 Hounsfield units. Second smaller mass in the left adrenal gland measuring 11 mm of similar density. Kidneys appear normal without contrast. No bladder abnormality is seen. Stomach/Bowel: Fairly large amount of gas within the intestine but without an obstructive pattern. No focal bowel lesion is seen. There is a periumbilical ventral hernia containing only fat. Vascular/Lymphatic: Aortic atherosclerosis. No aneurysm. IVC is normal. No retroperitoneal adenopathy. Reproductive: Normal Other: No free fluid or air. Musculoskeletal: Chronic postsurgical and degenerative changes of the spine. IMPRESSION: No ascites. Large amount of intra-abdominal fat. Moderate amount of gas in the intestine without obstructive pattern. Small periumbilical ventral hernia containing only fat. Two adrenal masses on the left, the larger measuring 2.3 cm in diameter with density as low as 15 Hounsfield units. These are likely to represent benign adenomas. Aortic atherosclerosis. Electronically Signed   By: Nelson Chimes M.D.   On: 01/17/2018 11:40   Dg Chest 2 View  Result Date: 01/16/2018 CLINICAL DATA:  Shortness of breath with exertion, worsening recently. History of congestive heart failure. EXAM: CHEST - 2 VIEW COMPARISON:  10/06/2016.  FINDINGS: Cardiomegaly. Aortic atherosclerosis. Pulmonary venous hypertension. Mild interstitial edema. Small effusions layering dependently. No acute bone finding. IMPRESSION: Congestive heart failure. Cardiomegaly. Pulmonary venous hypertension. Interstitial edema. Small effusions. Electronically Signed   By: Nelson Chimes M.D.   On: 01/16/2018 13:12   US Abdomen Limited  Result Date: 01/17/2018 CLINICAL DATA:  Evaluate for ascites.  Distended abdomen EXAM: LIMITED ABDOMEN ULTRASOUND FOR ASCITES TECHNIQUE: Limited ultrasound survey for ascites was performed in all four abdominal quadrants. COMPARISON:  None. FINDINGS: Surveillance images show no ascites. No incidental fluid dilated bowel or gross mass lesion. IMPRESSION: Negative for ascites. Electronically Signed   By: Monte Fantasia M.D.   On: 01/17/2018 07:27    Past Medical History:  Diagnosis Date  . Bradycardia 10/06/2016  . CHF (congestive heart failure) ([place])   . CKD stage 4 due to type 2 diabetes mellitus ([place]) 06/03/2015  . Dyslipidemia associated with type 2 diabetes mellitus ([place]) 05/05/2015  . Essential hypertension 05/05/2015  . IDDM (insulin dependent diabetes mellitus) ([place])   . SDAT (senile dementia of Alzheimer's type) 06/16/2013  . Uncontrolled insulin-dependent diabetes mellitus with renal manifestation ([place]) 05/05/2015     Allergies  Allergen Reactions  . Ace Inhibitors Other (See Comments) and Cough    Chronic cough  . Demerol Other (See Comments)    Hard to wake up  . Cosopt [Dorzolamide Hcl-Timolol Mal] Other (See Comments)    Cause heart rate  to drop  . Flomax [Tamsulosin Hcl] Other (See Comments)    Low BP  . Morphine And Related Other (See Comments)    hallucinations  . Oxytrol [Oxybutynin] Rash      Current Outpatient Medications on File Prior to Visit  Medication Sig Dispense Refill  . amLODipine (NORVASC) 10 MG tablet Take 1 tablet (10 mg total) by mouth daily. 30 tablet 0  . aspirin 81 MG tablet  Take 81 mg by mouth daily.    . B-D UF III MINI PEN NEEDLES 31G X 5 MM MISC USE FOR INJECTIONS AS DIRECTED 100 each 0  . carvedilol (COREG) 6.25 MG tablet TAKE 1 TABLET BY MOUTH TWICE DAILY WITH A MEAL. 180 tablet 0  . Cholecalciferol (VITAMIN D3) 5000 UNITS CAPS Take 5,000 Units by mouth 2 (two) times daily.     . cloNIDine (CATAPRES) 0.1 MG tablet Take 1 tablet (0.1 mg total) by mouth 2 (two) times daily. 60 tablet 0  . furosemide (LASIX) 40 MG tablet Take 1.5 tablets (60 mg total) by mouth 2 (two) times daily. 90 tablet 0  . hydrALAZINE (APRESOLINE) 100 MG tablet Take 1 tablet (100 mg total) by mouth 3 (three) times daily. KEEP OV. 90 tablet 3  . Insulin Detemir (LEVEMIR FLEXTOUCH) 100 UNIT/ML Pen Inject 20 Units into the skin daily. 15 mL 0  . LUMIGAN 0.01 % SOLN Place 1 drop into the right eye at bedtime.     . Multiple Vitamin (MULTIVITAMIN) tablet Take 1 tablet by mouth daily.      Marland Kitchen MYRBETRIQ 50 MG TB24 tablet Take 50 mg by mouth daily.     . ONE TOUCH ULTRA TEST test strip CHECK BLOOD SUGAR 6 TIMES A DAY. 540 each 3  . potassium chloride SA (K-DUR,KLOR-CON) 20 MEQ tablet Take 1 tablet (20 mEq total) by mouth daily. 30 tablet 0  . Probiotic Product (PROBIOTIC DAILY PO) Take 1 tablet by mouth daily.     Marland Kitchen senna-docusate (SENOKOT-S) 8.6-50 MG tablet Take 1 tablet by mouth at bedtime. (Patient taking differently: Take 1 tablet by mouth as needed. )    . SIMBRINZA 1-0.2 % SUSP Place 1 drop into both eyes 3 (three) times daily.     No current facility-administered medications on file prior to visit.     ROS: all negative except above.   Physical Exam: Filed Weights   01/25/18 1439  Weight: 216 lb 12.8 oz (98.3 kg)   BP (!) 142/62   Pulse 67   Temp 98.1 F (36.7 C)   Resp 16   Ht 5\' 11"  (1.803 m)   Wt 216 lb 12.8 oz (98.3 kg)   SpO2 97%   BMI 30.24 kg/m  General Appearance: Well nourished, in no apparent distress. Eyes: PERRLA, EOMs, conjunctiva no swelling or  erythema ENT/Mouth: Ext aud canals clear, TMs without erythema, bulging. No erythema, swelling, or exudate on post pharynx.  Tonsils not swollen or erythematous. Somewhat HOH.  Neck: Supple.  Respiratory: Respiratory effort normal, BS equal bilaterally without rales, rhonchi, wheezing or stridor.  Cardio: RRR with 2/6 systolic murmur. Brisk peripheral pulses of UE; lower extremities wrapped with cling compression dressing Abdomen: Soft, rounded, + BS.  Non tender, no guarding, rebound, hernias, masses. Lymphatics: Non tender without lymphadenopathy.  Musculoskeletal: slow gait with walker.   Skin: Warm, dry; bilateral lower extremities wrapped with compression dressings Psych: Awake and oriented X 2, normal affect, Insight and Judgment poor.     Izora Ribas, NP  3:29 PM [facility]

## 2018-01-25 ENCOUNTER — Ambulatory Visit: Payer: Medicare Other | Admitting: Adult Health

## 2018-01-25 ENCOUNTER — Encounter: Payer: Self-pay | Admitting: Adult Health

## 2018-01-25 VITALS — BP 142/62 | HR 67 | Temp 98.1°F | Resp 16 | Ht 71.0 in | Wt 216.8 lb

## 2018-01-25 DIAGNOSIS — L97821 Non-pressure chronic ulcer of other part of left lower leg limited to breakdown of skin: Secondary | ICD-10-CM

## 2018-01-25 DIAGNOSIS — E1122 Type 2 diabetes mellitus with diabetic chronic kidney disease: Secondary | ICD-10-CM

## 2018-01-25 DIAGNOSIS — R609 Edema, unspecified: Secondary | ICD-10-CM

## 2018-01-25 DIAGNOSIS — I5033 Acute on chronic diastolic (congestive) heart failure: Secondary | ICD-10-CM | POA: Diagnosis not present

## 2018-01-25 DIAGNOSIS — N184 Chronic kidney disease, stage 4 (severe): Secondary | ICD-10-CM

## 2018-01-25 DIAGNOSIS — E871 Hypo-osmolality and hyponatremia: Secondary | ICD-10-CM | POA: Diagnosis not present

## 2018-01-25 DIAGNOSIS — I872 Venous insufficiency (chronic) (peripheral): Secondary | ICD-10-CM

## 2018-01-25 LAB — BASIC METABOLIC PANEL WITH GFR
BUN/Creatinine Ratio: 22 (calc) (ref 6–22)
BUN: 52 mg/dL — ABNORMAL HIGH (ref 7–25)
CALCIUM: 9.7 mg/dL (ref 8.6–10.3)
CHLORIDE: 102 mmol/L (ref 98–110)
CO2: 30 mmol/L (ref 20–32)
Creat: 2.39 mg/dL — ABNORMAL HIGH (ref 0.70–1.11)
GFR, EST NON AFRICAN AMERICAN: 23 mL/min/{1.73_m2} — AB (ref >=60)
GFR, Est African American: 27 mL/min/{1.73_m2} — ABNORMAL LOW (ref >=60)
Glucose, Bld: 139 mg/dL — ABNORMAL HIGH (ref 65–99)
POTASSIUM: 5.4 mmol/L — AB (ref 3.5–5.3)
SODIUM: 140 mmol/L (ref 135–146)

## 2018-01-25 LAB — CBC WITH DIFFERENTIAL/PLATELET
BASOS ABS: 47 {cells}/uL (ref 0–200)
Basophils Relative: 0.7 %
EOS PCT: 1.9 %
Eosinophils Absolute: 127 cells/uL (ref 15–500)
HEMATOCRIT: 34.3 % — AB (ref 38.5–50.0)
Hemoglobin: 11.4 g/dL — ABNORMAL LOW (ref 13.2–17.1)
LYMPHS ABS: 1722 {cells}/uL (ref 850–3900)
MCH: 28.6 pg (ref 27.0–33.0)
MCHC: 33.2 g/dL (ref 32.0–36.0)
MCV: 86 fL (ref 80.0–100.0)
MPV: 11.5 fL (ref 7.5–12.5)
Monocytes Relative: 10.9 %
NEUTROS PCT: 60.8 %
Neutro Abs: 4074 cells/uL (ref 1500–7800)
PLATELETS: 282 10*3/uL (ref 140–400)
RBC: 3.99 10*6/uL — AB (ref 4.20–5.80)
RDW: 12.3 % (ref 11.0–15.0)
TOTAL LYMPHOCYTE: 25.7 %
WBC mixed population: 730 cells/uL (ref 200–950)
WBC: 6.7 10*3/uL (ref 3.8–10.8)

## 2018-01-25 NOTE — Patient Instructions (Addendum)
Please limit water/fluid intake to ~40 fluid ounces daily  Avoid adding salt, limit to 2000 mg or less daily  Call Dr. Debara Pickett to schedule follow up appointment    Heart Failure Action Plan A heart failure action plan helps you understand what to do when you have symptoms of heart failure. Follow the plan that was created by you and your health care provider. Review your plan each time you visit your health care provider. Red zone These signs and symptoms mean you should get medical help right away:  You have trouble breathing when resting.  You have a dry cough that is getting worse.  You have swelling or pain in your legs or abdomen that is getting worse.  You suddenly gain more than 2-3 lb (0.9-1.4 kg) in a day, or more than 5 lb (2.3 kg) in one week. This amount may be more or less depending on your condition.  You have trouble staying awake or you feel confused.  You have chest pain.  You do not have an appetite.  You pass out.  If you experience any of these symptoms:  Call your local emergency services (911 in the [place] right away or seek help at the emergency department of the nearest hospital.  Yellow zone These signs and symptoms mean your condition may be getting worse and you should make some changes:  You have trouble breathing when you are active or you need to sleep with extra pillows.  You have swelling in your legs or abdomen.  You gain 2-3 lb (0.9-1.4 kg) in one day, or 5 lb (2.3 kg) in one week. This amount may be more or less depending on your condition.  You get tired easily.  You have trouble sleeping.  You have a dry cough.  If you experience any of these symptoms:  Contact your health care provider within the next day.  Your health care provider may adjust your medicines.  Green zone These signs mean you are doing well and can continue what you are doing:  You do not have shortness of breath.  You have very little swelling or no new  swelling.  Your weight is stable (no gain or loss).  You have a normal activity level.  You do not have chest pain or any other new symptoms.  Follow these instructions at home:  Take over-the-counter and prescription medicines only as told by your health care provider.  Weigh yourself daily. Your target weight is __________ lb (__________ kg). ? Call your health care provider if you gain more than __________ lb (__________ kg) in a day, or more than __________ lb (__________ kg) in one week.  Eat a heart-healthy diet. Work with a diet and nutrition specialist (dietitian) to create an eating plan that is best for you.  Keep all follow-up visits as told by your health care provider. This is important. Where to find more information:  American Heart Association: www.heart.org Summary  Follow the action plan that was created by you and your health care provider.  Get help right away if you have any symptoms in the Red zone. This information is not intended to replace advice given to you by your health care provider. Make sure you discuss any questions you have with your health care provider. Document Released: 07/16/2016 Document Revised: 07/16/2016 Document Reviewed: 07/16/2016 Elsevier Interactive Patient Education  [employer].

## 2018-01-30 ENCOUNTER — Telehealth: Payer: Self-pay | Admitting: Internal Medicine

## 2018-01-30 NOTE — Telephone Encounter (Signed)
Pixie Casino, MD  Liane Comber, NP Cc: Gregory Levy, RN        Caryl Pina - I would advise decreasing lasix to 60 mg daily in light of worsening renal function and avoiding nephrotoxins. If he is on ACE-I or ARB, may need to hold or reduce dose. Looks like he had an appt with my PA Almyra Deforest on 8/1 which was cancelled. I will try to get him in this week to the office to see one of our APP's.   Thanks.   Dr. Lemmie Evens   Previous Messages    ----- Message -----  From: Liane Comber, NP  Sent: 01/26/2018  8:29 AM EDT  To: Pixie Casino, MD  Subject: RE: Gregory Hatfield                  Dr. Debara Pickett,   Gregory Hatfield's labs resulted this AM and unfortunately his renal function is doing worse than anticipated; GFR was stable while hospitalized, but since discharge GFR has dropped 37 > 27 on lasix 60 mg BID. The family is very anxious regarding his care; we would appreciate your expertise and recommendations in managing his diuretics in light of his declining renal functions.   Sincerely,   Liane Comber, NP-C

## 2018-01-30 NOTE — Telephone Encounter (Signed)
Patient was admitted to hospital and discharged on 8/8.   LMTCB to schedule f/up appt with APP (if available) or MD early next week (OK to use held slot)

## 2018-02-06 NOTE — Telephone Encounter (Signed)
Patient has been scheduled for 02/23/18 with A. Duke PA

## 2018-02-07 ENCOUNTER — Other Ambulatory Visit: Payer: Self-pay

## 2018-02-07 DIAGNOSIS — N184 Chronic kidney disease, stage 4 (severe): Principal | ICD-10-CM

## 2018-02-07 DIAGNOSIS — R300 Dysuria: Secondary | ICD-10-CM

## 2018-02-07 DIAGNOSIS — E1122 Type 2 diabetes mellitus with diabetic chronic kidney disease: Secondary | ICD-10-CM

## 2018-02-08 ENCOUNTER — Encounter: Payer: Self-pay | Admitting: Adult Health

## 2018-02-08 ENCOUNTER — Ambulatory Visit: Payer: Medicare Other | Admitting: Adult Health

## 2018-02-08 ENCOUNTER — Inpatient Hospital Stay (HOSPITAL_COMMUNITY)
Admission: EM | Admit: 2018-02-08 | Discharge: 2018-02-12 | DRG: 689 | Disposition: A | Discharge status: Skilled Nursing Facility | Payer: Medicare Other | Attending: Internal Medicine | Admitting: Internal Medicine

## 2018-02-08 ENCOUNTER — Ambulatory Visit: Payer: Self-pay

## 2018-02-08 VITALS — BP 130/58 | HR 60 | Temp 97.5°F | Ht 71.0 in | Wt 207.0 lb

## 2018-02-08 DIAGNOSIS — F0280 Dementia in other diseases classified elsewhere without behavioral disturbance: Secondary | ICD-10-CM | POA: Diagnosis present

## 2018-02-08 DIAGNOSIS — G301 Alzheimer's disease with late onset: Secondary | ICD-10-CM

## 2018-02-08 DIAGNOSIS — Z7982 Long term (current) use of aspirin: Secondary | ICD-10-CM

## 2018-02-08 DIAGNOSIS — E785 Hyperlipidemia, unspecified: Secondary | ICD-10-CM | POA: Diagnosis present

## 2018-02-08 DIAGNOSIS — E870 Hyperosmolality and hypernatremia: Secondary | ICD-10-CM | POA: Diagnosis present

## 2018-02-08 DIAGNOSIS — E1169 Type 2 diabetes mellitus with other specified complication: Secondary | ICD-10-CM | POA: Diagnosis present

## 2018-02-08 DIAGNOSIS — J9811 Atelectasis: Secondary | ICD-10-CM | POA: Diagnosis present

## 2018-02-08 DIAGNOSIS — R52 Pain, unspecified: Secondary | ICD-10-CM

## 2018-02-08 DIAGNOSIS — Z87891 Personal history of nicotine dependence: Secondary | ICD-10-CM

## 2018-02-08 DIAGNOSIS — E1122 Type 2 diabetes mellitus with diabetic chronic kidney disease: Secondary | ICD-10-CM | POA: Diagnosis present

## 2018-02-08 DIAGNOSIS — N179 Acute kidney failure, unspecified: Secondary | ICD-10-CM | POA: Diagnosis present

## 2018-02-08 DIAGNOSIS — N184 Chronic kidney disease, stage 4 (severe): Secondary | ICD-10-CM

## 2018-02-08 DIAGNOSIS — I5032 Chronic diastolic (congestive) heart failure: Secondary | ICD-10-CM | POA: Diagnosis present

## 2018-02-08 DIAGNOSIS — G9340 Encephalopathy, unspecified: Secondary | ICD-10-CM | POA: Diagnosis present

## 2018-02-08 DIAGNOSIS — E119 Type 2 diabetes mellitus without complications: Secondary | ICD-10-CM

## 2018-02-08 DIAGNOSIS — Z885 Allergy status to narcotic agent status: Secondary | ICD-10-CM

## 2018-02-08 DIAGNOSIS — R451 Restlessness and agitation: Secondary | ICD-10-CM | POA: Diagnosis present

## 2018-02-08 DIAGNOSIS — Z96642 Presence of left artificial hip joint: Secondary | ICD-10-CM | POA: Diagnosis present

## 2018-02-08 DIAGNOSIS — N390 Urinary tract infection, site not specified: Secondary | ICD-10-CM | POA: Diagnosis present

## 2018-02-08 DIAGNOSIS — Z888 Allergy status to other drugs, medicaments and biological substances status: Secondary | ICD-10-CM

## 2018-02-08 DIAGNOSIS — G9341 Metabolic encephalopathy: Secondary | ICD-10-CM | POA: Diagnosis present

## 2018-02-08 DIAGNOSIS — Z79899 Other long term (current) drug therapy: Secondary | ICD-10-CM

## 2018-02-08 DIAGNOSIS — I130 Hypertensive heart and chronic kidney disease with heart failure and stage 1 through stage 4 chronic kidney disease, or unspecified chronic kidney disease: Secondary | ICD-10-CM | POA: Diagnosis present

## 2018-02-08 DIAGNOSIS — L8990 Pressure ulcer of unspecified site, unspecified stage: Secondary | ICD-10-CM

## 2018-02-08 DIAGNOSIS — R4182 Altered mental status, unspecified: Secondary | ICD-10-CM

## 2018-02-08 DIAGNOSIS — I10 Essential (primary) hypertension: Secondary | ICD-10-CM | POA: Diagnosis present

## 2018-02-08 DIAGNOSIS — E860 Dehydration: Secondary | ICD-10-CM | POA: Diagnosis present

## 2018-02-08 DIAGNOSIS — L89302 Pressure ulcer of unspecified buttock, stage 2: Secondary | ICD-10-CM | POA: Diagnosis present

## 2018-02-08 DIAGNOSIS — Z794 Long term (current) use of insulin: Secondary | ICD-10-CM

## 2018-02-08 MED ORDER — INSULIN DETEMIR 100 UNIT/ML FLEXPEN: PEN_INJECTOR | SUBCUTANEOUS | Status: DC

## 2018-02-08 NOTE — Patient Instructions (Addendum)
Goals    . Limit fluid intake to 40 fluid ounces per day       Alzheimer Disease Caregiver Guide A person who has Alzheimer disease may not be able to take care of himself or herself. He or she may need help with simple tasks. The tips below can help you care for the person. Memory loss and confusion If the person is confused or cannot remember things:  Stay calm.  Respond with a short answer.  Avoid correcting him or her in a way that sounds like scolding.  Try not to take it personally, even if he or she forgets your name.  Behavior changes The person may go through behavior changes. This can include depression, anxiety, anger, or seeing things that are not there. When behavior changes:  Try not to take behavior changes personally.  Stay calm and patient.  Do not argue or try to convince the person about a specific point.  Know that these changes are part of the disease process. Try to work through it.  Tips to lessen frustration  Make appointments and do daily tasks when the person is at his or her best.  Take your time. Simple tasks may take longer. Allow plenty of time to complete tasks.  Limit choices for the person.  Involve the person in what you are doing.  Stick to a routine.  Avoid new or crowded places, if possible.  Use simple words, short sentences, and a calm voice. Only give 1 direction at a time.  Buy clothes and shoes that are easy to put on and take off.  Let people help if they offer. Home safety  Keep floors clear. Remove rugs, magazine racks, and floor lamps.  Keep hallways well lit.  Put a handrail and nonslip mat in the bathtub or shower.  Put childproof locks on cabinets that have dangerous items in them. These items include medicine, alcohol, guns, toxic cleaning items, sharp tools, matches, or lighters.  Place locks on doors where the person cannot see or reach them. This helps the person to not wander out of the house and get  lost.  Be prepared for emergencies. Keep a list of emergency phone numbers and addresses in a handy area. Plans for the future  Talk about finances. ? Talk about money management. People with Alzheimer disease have trouble managing their money as the disease gets worse. ? Get help from professional advisors about financial and legal matters.  Talk about future care. ? Choose a power of attorney. This is someone who can make decisions for the person with Alzheimer disease when he or she can no longer do so. ? Talk about driving and when it is the right time to stop. The person's doctor can help with this. ? If the person lives alone, make sure he or she is safe. Some people need extra help at home. Other people need more care at a nursing home or care center. Support groups Some benefits of joining a support group include:  Learning ways to manage stress.  Sharing experiences with others.  Getting emotional comfort and support.  Learning new caregiving skills as the disease progresses.  Knowing what community resources are available and taking advantage of them.  Get help if:  The person has a fever.  The person has a sudden behavior change that does not get better with calming strategies.  The person is unable to manage his or her living situation.  The person threatens you or anyone  else, including himself or herself.  You are no longer able to care for the person. This information is not intended to replace advice given to you by your health care provider. Make sure you discuss any questions you have with your health care provider. Document Released: 08/29/2011 Document Revised: 11/12/2015 Document Reviewed: 07/27/2011 Elsevier Interactive Patient Education  2017 [employer].

## 2018-02-08 NOTE — Progress Notes (Signed)
Assessment and Plan:  Gregory Hatfield was seen today for follow-up.  Diagnoses and all orders for this visit:  SDAT (senile dementia of Alzheimer's type)/ Agitation Will screen for infection, possible UTI, no other concerning symptoms, patient very pleasant with provider, appears acting out with caregivers, progression of SDAT, pending admission to assisted living with consideration for progression to memory care. Forms completed for this process as requested.  -     CBC with Differential/Platelet -     COMPLETE METABOLIC PANEL WITH GFR -     Urinalysis w microscopic + reflex cultur  CKD stage 4 due to type 2 diabetes mellitus ([place]) -     COMPLETE METABOLIC PANEL WITH GFR  Further disposition pending results of labs. Discussed med's effects and SE's.   Over 15 minutes of exam, counseling, chart review, and critical decision making was performed.   Future Appointments  Date Time Provider [place]  02/13/2018 11:30 AM Pixie Casino, MD [facility]  03/22/2018 11:00 AM [name], MD [facility] None  04/13/2018 11:15 AM Renato Shin, MD [facility] None    ------------------------------------------------------------------------------------------------------------------   HPI BP (!) 130/58   Pulse 60   Temp (!) 97.5 F (36.4 C)   Ht 5\' 11"  (1.803 m)   Wt 207 lb (93.9 kg)   SpO2 97%   BMI 28.87 kg/m   84 y.o.male with SDAT, CHF with recent exacerbation/hospitalization, CKD 4, T2DM presents accompanied by his daughter for reports of increased agitation/acting out over the past 1-2 weeks.   He is also in process for applying for assisted care living (completed evaluation with [facility], Janett Billow; currently has some gaps in care at home, patient has very poor decision making, going outside the home when unsupervised (not wandering/getting lost), falls at home, biting, slapping at family, and locked family members out of house for 4 hours, attempting to drive.    He has a history of CHF with recent exacerbation requiring hospitalization; today he denies dyspnea on exertion, orthopnea, paroxysmal nocturnal dyspnea and edema. Positive for none. He is weighing daily and BPs at goal on home log on review.  Wt Readings from Last 3 Encounters:  02/08/18 207 lb (93.9 kg)  01/25/18 216 lb 12.8 oz (98.3 kg)  01/19/18 223 lb 1.7 oz (101.2 kg)   Denies fever/chills, dyspnea, cough, fatigue, urinary symptoms other than strong urine odor.   Past Medical History:  Diagnosis Date  . Bradycardia 10/06/2016  . CHF (congestive heart failure) ([place])   . CKD stage 4 due to type 2 diabetes mellitus ([place]) 06/03/2015  . Dyslipidemia associated with type 2 diabetes mellitus ([place]) 05/05/2015  . Essential hypertension 05/05/2015  . IDDM (insulin dependent diabetes mellitus) ([place])   . SDAT (senile dementia of Alzheimer's type) 06/16/2013  . Uncontrolled insulin-dependent diabetes mellitus with renal manifestation ([place]) 05/05/2015     Allergies  Allergen Reactions  . Ace Inhibitors Other (See Comments) and Cough    Chronic cough  . Demerol Other (See Comments)    Hard to wake up  . Cosopt [Dorzolamide Hcl-Timolol Mal] Other (See Comments)    Cause heart rate to drop  . Flomax [Tamsulosin Hcl] Other (See Comments)    Low BP  . Morphine And Related Other (See Comments)    hallucinations  . Oxytrol [Oxybutynin] Rash    Current Outpatient Medications on File Prior to Visit  Medication Sig  . amLODipine (NORVASC) 10 MG tablet Take 1 tablet (10 mg total) by mouth daily.  Marland Kitchen aspirin 81  MG tablet Take 81 mg by mouth daily.  . B-D UF III MINI PEN NEEDLES 31G X 5 MM MISC USE FOR INJECTIONS AS DIRECTED  . carvedilol (COREG) 6.25 MG tablet TAKE 1 TABLET BY MOUTH TWICE DAILY WITH A MEAL.  Marland Kitchen Cholecalciferol (VITAMIN D3) 5000 UNITS CAPS Take 5,000 Units by mouth 2 (two) times daily.   . cloNIDine (CATAPRES) 0.1 MG tablet Take 1 tablet (0.1 mg total) by mouth 2 (two) times  daily.  . furosemide (LASIX) 40 MG tablet Take 1.5 tablets (60 mg total) by mouth 2 (two) times daily.  . hydrALAZINE (APRESOLINE) 100 MG tablet Take 1 tablet (100 mg total) by mouth 3 (three) times daily. KEEP OV.  Marland Kitchen LUMIGAN 0.01 % SOLN Place 1 drop into the right eye at bedtime.   . Multiple Vitamin (MULTIVITAMIN) tablet Take 1 tablet by mouth daily.    Marland Kitchen MYRBETRIQ 50 MG TB24 tablet Take 50 mg by mouth daily.   . ONE TOUCH ULTRA TEST test strip CHECK BLOOD SUGAR 6 TIMES A DAY.  Marland Kitchen potassium chloride SA (K-DUR,KLOR-CON) 20 MEQ tablet Take 1 tablet (20 mEq total) by mouth daily.  . Probiotic Product (PROBIOTIC DAILY PO) Take 1 tablet by mouth daily.   Marland Kitchen senna-docusate (SENOKOT-S) 8.6-50 MG tablet Take 1 tablet by mouth at bedtime. (Patient taking differently: Take 1 tablet by mouth as needed. )  . SIMBRINZA 1-0.2 % SUSP Place 1 drop into both eyes 3 (three) times daily.   No current facility-administered medications on file prior to visit.     ROS: all negative except above.   Physical Exam:  BP (!) 130/58   Pulse 60   Temp (!) 97.5 F (36.4 C)   Ht 5\' 11"  (1.803 m)   Wt 207 lb (93.9 kg)   SpO2 97%   BMI 28.87 kg/m   General Appearance: Well nourished, in no apparent distress. Eyes: PERRLA, EOMs, conjunctiva no swelling or erythema Sinuses: No Frontal/maxillary tenderness ENT/Mouth: Ext aud canals clear, TMs without erythema, bulging. No erythema, swelling, or exudate on post pharynx.  Tonsils not swollen or erythematous. Hearing normal.  Neck: Supple, thyroid normal.  Respiratory: Respiratory effort normal, BS equal bilaterally without rales, rhonchi, wheezing or stridor.  Cardio: RRR with no MRGs. No peripheral edema; bilateral lower extremities wrapped with coban wrap dressings.  Abdomen: Soft, + BS.  Non tender, no guarding, rebound, hernias, masses. Lymphatics: Non tender without lymphadenopathy.  Musculoskeletal: Full ROM, 5/5 strength, slow gait with walker.  Skin: Warm,  dry without rashes, lesions, ecchymosis.  Neuro: Cranial nerves intact. Normal muscle tone, Sensation intact.  Psych: Awake and oriented X 3, normal affect, Insight and Judgment very poor, confabulating, wandering thoughts.      Izora Ribas, NP 1:38 PM [facility]

## 2018-02-08 NOTE — ED Triage Notes (Addendum)
Per Ems: pt coming from home after family noticed foul smelling urine. Pt family c/o combative behavior all day and believed his sugar was low but it was 96 when EMS arrived.   Hx of dementia and CHF. Pt has wound on his bottom prior to arrival. Staff applied sacral pad during triage  5 mg Haldol and 5 mg Midazolam given by EMS

## 2018-02-09 ENCOUNTER — Other Ambulatory Visit: Payer: Self-pay | Admitting: Adult Health

## 2018-02-09 ENCOUNTER — Emergency Department (HOSPITAL_COMMUNITY): Payer: Medicare Other

## 2018-02-09 ENCOUNTER — Other Ambulatory Visit: Payer: Self-pay

## 2018-02-09 ENCOUNTER — Encounter (HOSPITAL_COMMUNITY): Payer: Self-pay | Admitting: Family Medicine

## 2018-02-09 DIAGNOSIS — Z794 Long term (current) use of insulin: Secondary | ICD-10-CM | POA: Diagnosis not present

## 2018-02-09 DIAGNOSIS — I5032 Chronic diastolic (congestive) heart failure: Secondary | ICD-10-CM

## 2018-02-09 DIAGNOSIS — N184 Chronic kidney disease, stage 4 (severe): Secondary | ICD-10-CM | POA: Diagnosis present

## 2018-02-09 DIAGNOSIS — Z885 Allergy status to narcotic agent status: Secondary | ICD-10-CM | POA: Diagnosis not present

## 2018-02-09 DIAGNOSIS — R4182 Altered mental status, unspecified: Secondary | ICD-10-CM

## 2018-02-09 DIAGNOSIS — E785 Hyperlipidemia, unspecified: Secondary | ICD-10-CM | POA: Diagnosis present

## 2018-02-09 DIAGNOSIS — Z79899 Other long term (current) drug therapy: Secondary | ICD-10-CM | POA: Diagnosis not present

## 2018-02-09 DIAGNOSIS — Z87891 Personal history of nicotine dependence: Secondary | ICD-10-CM | POA: Diagnosis not present

## 2018-02-09 DIAGNOSIS — E119 Type 2 diabetes mellitus without complications: Secondary | ICD-10-CM

## 2018-02-09 DIAGNOSIS — N390 Urinary tract infection, site not specified: Secondary | ICD-10-CM | POA: Diagnosis present

## 2018-02-09 DIAGNOSIS — L89302 Pressure ulcer of unspecified buttock, stage 2: Secondary | ICD-10-CM | POA: Diagnosis present

## 2018-02-09 DIAGNOSIS — G9341 Metabolic encephalopathy: Secondary | ICD-10-CM | POA: Diagnosis present

## 2018-02-09 DIAGNOSIS — I130 Hypertensive heart and chronic kidney disease with heart failure and stage 1 through stage 4 chronic kidney disease, or unspecified chronic kidney disease: Secondary | ICD-10-CM | POA: Diagnosis present

## 2018-02-09 DIAGNOSIS — R451 Restlessness and agitation: Secondary | ICD-10-CM | POA: Diagnosis present

## 2018-02-09 DIAGNOSIS — F0280 Dementia in other diseases classified elsewhere without behavioral disturbance: Secondary | ICD-10-CM | POA: Diagnosis present

## 2018-02-09 DIAGNOSIS — E1122 Type 2 diabetes mellitus with diabetic chronic kidney disease: Secondary | ICD-10-CM | POA: Diagnosis present

## 2018-02-09 DIAGNOSIS — Z7982 Long term (current) use of aspirin: Secondary | ICD-10-CM | POA: Diagnosis not present

## 2018-02-09 DIAGNOSIS — E1169 Type 2 diabetes mellitus with other specified complication: Secondary | ICD-10-CM | POA: Diagnosis present

## 2018-02-09 DIAGNOSIS — G9340 Encephalopathy, unspecified: Secondary | ICD-10-CM | POA: Diagnosis present

## 2018-02-09 DIAGNOSIS — G301 Alzheimer's disease with late onset: Secondary | ICD-10-CM | POA: Diagnosis present

## 2018-02-09 DIAGNOSIS — E870 Hyperosmolality and hypernatremia: Secondary | ICD-10-CM | POA: Diagnosis present

## 2018-02-09 DIAGNOSIS — Z96642 Presence of left artificial hip joint: Secondary | ICD-10-CM | POA: Diagnosis present

## 2018-02-09 DIAGNOSIS — I10 Essential (primary) hypertension: Secondary | ICD-10-CM

## 2018-02-09 DIAGNOSIS — L8990 Pressure ulcer of unspecified site, unspecified stage: Secondary | ICD-10-CM

## 2018-02-09 DIAGNOSIS — J9811 Atelectasis: Secondary | ICD-10-CM | POA: Diagnosis present

## 2018-02-09 DIAGNOSIS — Z888 Allergy status to other drugs, medicaments and biological substances status: Secondary | ICD-10-CM | POA: Diagnosis not present

## 2018-02-09 DIAGNOSIS — E860 Dehydration: Secondary | ICD-10-CM | POA: Diagnosis present

## 2018-02-09 DIAGNOSIS — N179 Acute kidney failure, unspecified: Secondary | ICD-10-CM | POA: Diagnosis present

## 2018-02-09 LAB — URINALYSIS, ROUTINE W REFLEX MICROSCOPIC
Bilirubin Urine: NEGATIVE
GLUCOSE, UA: NEGATIVE mg/dL
Hgb urine dipstick: NEGATIVE
Ketones, ur: NEGATIVE mg/dL
NITRITE: NEGATIVE
PH: 5 (ref 5.0–8.0)
PROTEIN: NEGATIVE mg/dL
SPECIFIC GRAVITY, URINE: 1.014 (ref 1.005–1.030)

## 2018-02-09 LAB — CBC WITH DIFFERENTIAL/PLATELET
BASOS ABS: 0 10*3/uL (ref 0.0–0.1)
BASOS PCT: 1 %
Eosinophils Absolute: 0.1 10*3/uL (ref 0.0–0.7)
Eosinophils Relative: 2 %
HCT: 38.2 % — ABNORMAL LOW (ref 39.0–52.0)
Hemoglobin: 12.1 g/dL — ABNORMAL LOW (ref 13.0–17.0)
Lymphocytes Relative: 39 %
Lymphs Abs: 2.1 10*3/uL (ref 0.7–4.0)
MCH: 29.2 pg (ref 26.0–34.0)
MCHC: 31.7 g/dL (ref 30.0–36.0)
MCV: 92 fL (ref 78.0–100.0)
MONO ABS: 0.6 10*3/uL (ref 0.1–1.0)
Monocytes Relative: 11 %
NEUTROS ABS: 2.6 10*3/uL (ref 1.7–7.7)
Neutrophils Relative %: 47 %
PLATELETS: 218 10*3/uL (ref 150–400)
RBC: 4.15 MIL/uL — AB (ref 4.22–5.81)
RDW: 14.5 % (ref 11.5–15.5)
WBC: 5.5 10*3/uL (ref 4.0–10.5)

## 2018-02-09 LAB — COMPREHENSIVE METABOLIC PANEL
ALBUMIN: 4 g/dL (ref 3.5–5.0)
ALT: 19 U/L (ref 0–44)
AST: 44 U/L — AB (ref 15–41)
Alkaline Phosphatase: 55 U/L (ref 38–126)
Anion gap: 11 (ref 5–15)
BILIRUBIN TOTAL: 1.2 mg/dL (ref 0.3–1.2)
BUN: 38 mg/dL — AB (ref 8–23)
CHLORIDE: 112 mmol/L — AB (ref 98–111)
CO2: 26 mmol/L (ref 22–32)
Calcium: 9.4 mg/dL (ref 8.9–10.3)
Creatinine, Ser: 2.21 mg/dL — ABNORMAL HIGH (ref 0.61–1.24)
GFR calc Af Amer: 28 mL/min — ABNORMAL LOW (ref >60)
GFR calc non Af Amer: 25 mL/min — ABNORMAL LOW (ref >60)
GLUCOSE: 88 mg/dL (ref 70–99)
POTASSIUM: 4.5 mmol/L (ref 3.5–5.1)
Sodium: 149 mmol/L — ABNORMAL HIGH (ref 135–145)
Total Protein: 7.1 g/dL (ref 6.5–8.1)

## 2018-02-09 LAB — GLUCOSE, CAPILLARY
Glucose-Capillary: 115 mg/dL — ABNORMAL HIGH (ref 70–99)
Glucose-Capillary: 158 mg/dL — ABNORMAL HIGH (ref 70–99)

## 2018-02-09 LAB — TROPONIN I: Troponin I: 0.03 ng/mL (ref <0.03)

## 2018-02-09 MED ORDER — SODIUM CHLORIDE 0.9 % IV SOLN: 250.0000 mL | INTRAVENOUS | Status: DC | PRN

## 2018-02-09 MED ORDER — ADULT MULTIVITAMIN W/MINERALS CH: 1.0000 | ORAL_TABLET | Freq: Every day | ORAL | Status: DC

## 2018-02-09 MED ORDER — ONDANSETRON HCL 4 MG PO TABS: 4.0000 mg | ORAL_TABLET | Freq: Four times a day (QID) | ORAL | Status: DC | PRN

## 2018-02-09 MED ORDER — HALOPERIDOL 2 MG PO TABS
2.0000 mg | ORAL_TABLET | ORAL | Status: DC | PRN
  Filled 2018-02-09: qty 1

## 2018-02-09 MED ORDER — OLANZAPINE 5 MG PO TABS
5.0000 mg | ORAL_TABLET | Freq: Every day | ORAL | Status: DC
  Administered 2018-02-09 – 2018-02-12 (×4): 5 mg via ORAL
  Filled 2018-02-09 (×4): qty 1

## 2018-02-09 MED ORDER — CARVEDILOL 6.25 MG PO TABS
6.2500 mg | ORAL_TABLET | Freq: Two times a day (BID) | ORAL | Status: DC
  Administered 2018-02-10 – 2018-02-12 (×6): 6.25 mg via ORAL
  Filled 2018-02-09 (×7): qty 1

## 2018-02-09 MED ORDER — BRINZOLAMIDE 1 % OP SUSP
1.0000 [drp] | Freq: Three times a day (TID) | OPHTHALMIC | Status: DC
  Administered 2018-02-09 – 2018-02-12 (×11): 1 [drp] via OPHTHALMIC
  Filled 2018-02-09: qty 10

## 2018-02-09 MED ORDER — SODIUM CHLORIDE 0.9% FLUSH
3.0000 mL | Freq: Two times a day (BID) | INTRAVENOUS | Status: DC
  Administered 2018-02-11 – 2018-02-12 (×2): 3 mL via INTRAVENOUS

## 2018-02-09 MED ORDER — ONDANSETRON HCL 4 MG/2ML IJ SOLN
4.0000 mg | Freq: Four times a day (QID) | INTRAMUSCULAR | Status: DC | PRN
  Administered 2018-02-12: 4 mg via INTRAVENOUS
  Filled 2018-02-09: qty 2

## 2018-02-09 MED ORDER — SODIUM CHLORIDE 0.9% FLUSH: 3.0000 mL | INTRAVENOUS | Status: DC | PRN

## 2018-02-09 MED ORDER — INSULIN ASPART 100 UNIT/ML ~~LOC~~ SOLN
0.0000 [IU] | Freq: Every day | SUBCUTANEOUS | Status: DC
  Administered 2018-02-11: 2 [IU] via SUBCUTANEOUS
  Administered 2018-02-12: 5 [IU] via SUBCUTANEOUS

## 2018-02-09 MED ORDER — LATANOPROST 0.005 % OP SOLN
1.0000 [drp] | Freq: Every day | OPHTHALMIC | Status: DC
  Administered 2018-02-09 – 2018-02-12 (×4): 1 [drp] via OPHTHALMIC
  Filled 2018-02-09: qty 2.5

## 2018-02-09 MED ORDER — VITAMIN D3 25 MCG (1000 UNIT) PO TABS
5000.0000 [IU] | ORAL_TABLET | Freq: Two times a day (BID) | ORAL | Status: DC
  Administered 2018-02-10 – 2018-02-12 (×5): 5000 [IU] via ORAL
  Filled 2018-02-09 (×12): qty 5

## 2018-02-09 MED ORDER — ENSURE ENLIVE PO LIQD
237.0000 mL | Freq: Two times a day (BID) | ORAL | Status: DC
  Administered 2018-02-09 – 2018-02-12 (×3): 237 mL via ORAL

## 2018-02-09 MED ORDER — MIRABEGRON ER 25 MG PO TB24
50.0000 mg | ORAL_TABLET | Freq: Every day | ORAL | Status: DC
  Administered 2018-02-10 – 2018-02-12 (×3): 50 mg via ORAL
  Filled 2018-02-09 (×4): qty 2

## 2018-02-09 MED ORDER — AMLODIPINE BESYLATE 5 MG PO TABS
10.0000 mg | ORAL_TABLET | Freq: Every day | ORAL | Status: DC
  Administered 2018-02-10 – 2018-02-12 (×3): 10 mg via ORAL
  Filled 2018-02-09 (×4): qty 2

## 2018-02-09 MED ORDER — HALOPERIDOL LACTATE 5 MG/ML IJ SOLN: 2.0000 mg | INTRAMUSCULAR | Status: DC | PRN

## 2018-02-09 MED ORDER — CLONIDINE HCL 0.2 MG/24HR TD PTWK
0.2000 mg | MEDICATED_PATCH | TRANSDERMAL | Status: DC
  Administered 2018-02-09: 0.2 mg via TRANSDERMAL
  Filled 2018-02-09: qty 1

## 2018-02-09 MED ORDER — ACETAMINOPHEN 325 MG PO TABS: 650.0000 mg | ORAL_TABLET | Freq: Four times a day (QID) | ORAL | Status: DC | PRN

## 2018-02-09 MED ORDER — HALOPERIDOL LACTATE 5 MG/ML IJ SOLN
1.0000 mg | Freq: Four times a day (QID) | INTRAMUSCULAR | Status: DC | PRN
  Administered 2018-02-09: 1 mg via INTRAVENOUS
  Filled 2018-02-09: qty 1

## 2018-02-09 MED ORDER — SENNOSIDES-DOCUSATE SODIUM 8.6-50 MG PO TABS: 1.0000 | ORAL_TABLET | Freq: Every evening | ORAL | Status: DC | PRN

## 2018-02-09 MED ORDER — BRIMONIDINE TARTRATE 0.15 % OP SOLN
1.0000 [drp] | Freq: Three times a day (TID) | OPHTHALMIC | Status: DC
  Administered 2018-02-09 – 2018-02-12 (×10): 1 [drp] via OPHTHALMIC
  Filled 2018-02-09: qty 5

## 2018-02-09 MED ORDER — INSULIN ASPART 100 UNIT/ML ~~LOC~~ SOLN
0.0000 [IU] | Freq: Three times a day (TID) | SUBCUTANEOUS | Status: DC
  Administered 2018-02-10: 1 [IU] via SUBCUTANEOUS
  Administered 2018-02-10: 2 [IU] via SUBCUTANEOUS
  Administered 2018-02-10: 3 [IU] via SUBCUTANEOUS
  Administered 2018-02-11: 1 [IU] via SUBCUTANEOUS
  Administered 2018-02-11: 5 [IU] via SUBCUTANEOUS
  Administered 2018-02-11: 2 [IU] via SUBCUTANEOUS
  Administered 2018-02-12: 3 [IU] via SUBCUTANEOUS
  Administered 2018-02-12: 2 [IU] via SUBCUTANEOUS
  Administered 2018-02-12: 7 [IU] via SUBCUTANEOUS

## 2018-02-09 MED ORDER — CLONIDINE HCL 0.1 MG PO TABS: 0.1000 mg | ORAL_TABLET | Freq: Two times a day (BID) | ORAL | Status: DC

## 2018-02-09 MED ORDER — HEPARIN SODIUM (PORCINE) 5000 UNIT/ML IJ SOLN
5000.0000 [IU] | Freq: Three times a day (TID) | INTRAMUSCULAR | Status: DC
  Administered 2018-02-10 – 2018-02-12 (×9): 5000 [IU] via SUBCUTANEOUS
  Filled 2018-02-09 (×10): qty 1

## 2018-02-09 MED ORDER — ELDERTONIC PO LIQD
15.0000 mL | Freq: Every day | ORAL | Status: DC
  Administered 2018-02-10 – 2018-02-12 (×3): 15 mL via ORAL
  Filled 2018-02-09 (×4): qty 15

## 2018-02-09 MED ORDER — SODIUM CHLORIDE 0.9 % IV SOLN
1.0000 g | Freq: Once | INTRAVENOUS | Status: AC
  Administered 2018-02-09: 1 g via INTRAVENOUS
  Filled 2018-02-09: qty 10

## 2018-02-09 MED ORDER — HYDRALAZINE HCL 50 MG PO TABS
100.0000 mg | ORAL_TABLET | Freq: Three times a day (TID) | ORAL | Status: DC
  Administered 2018-02-09 – 2018-02-12 (×10): 100 mg via ORAL
  Filled 2018-02-09 (×12): qty 2

## 2018-02-09 MED ORDER — HALOPERIDOL 2 MG PO TABS
2.0000 mg | ORAL_TABLET | ORAL | Status: DC | PRN
  Filled 2018-02-09 (×2): qty 1

## 2018-02-09 MED ORDER — ASPIRIN EC 81 MG PO TBEC
81.0000 mg | DELAYED_RELEASE_TABLET | Freq: Every day | ORAL | Status: DC
  Administered 2018-02-10 – 2018-02-12 (×3): 81 mg via ORAL
  Filled 2018-02-09 (×4): qty 1

## 2018-02-09 MED ORDER — CEFUROXIME AXETIL 250 MG PO TABS: 250.0000 mg | ORAL_TABLET | Freq: Every day | ORAL | 0 refills | Status: DC

## 2018-02-09 MED ORDER — CEFUROXIME AXETIL 250 MG PO TABS: 250.0000 mg | ORAL_TABLET | Freq: Two times a day (BID) | ORAL | 0 refills | Status: DC

## 2018-02-09 MED ORDER — HALOPERIDOL LACTATE 5 MG/ML IJ SOLN
2.0000 mg | INTRAMUSCULAR | Status: DC | PRN
  Administered 2018-02-09 – 2018-02-11 (×3): 2 mg via INTRAMUSCULAR
  Filled 2018-02-09 (×3): qty 1

## 2018-02-09 MED ORDER — SODIUM CHLORIDE 0.45 % IV SOLN
INTRAVENOUS | Status: DC
  Administered 2018-02-09 – 2018-02-10 (×4): via INTRAVENOUS

## 2018-02-09 MED ORDER — ACETAMINOPHEN 650 MG RE SUPP: 650.0000 mg | Freq: Four times a day (QID) | RECTAL | Status: DC | PRN

## 2018-02-09 MED ORDER — THIAMINE HCL 100 MG/ML IJ SOLN
100.0000 mg | Freq: Every day | INTRAMUSCULAR | Status: DC
  Administered 2018-02-09 – 2018-02-10 (×2): 100 mg via INTRAVENOUS
  Filled 2018-02-09 (×3): qty 2

## 2018-02-09 MED ORDER — SODIUM CHLORIDE 0.9 % IV SOLN
Freq: Once | INTRAVENOUS | Status: AC
  Administered 2018-02-09: 01:00:00 via INTRAVENOUS

## 2018-02-09 MED ORDER — SODIUM CHLORIDE 0.9 % IV SOLN
1.0000 g | INTRAVENOUS | Status: DC
  Administered 2018-02-10 – 2018-02-11 (×2): 1 g via INTRAVENOUS
  Filled 2018-02-09 (×2): qty 1

## 2018-02-09 NOTE — Progress Notes (Signed)
ED TO INPATIENT HANDOFF REPORT  Name/Age/Gender Gregory Hatfield 84 y.o. male  Code Status    Code Status Orders  (From admission, onward)         Start     Ordered   02/09/18 0525  Full code  Continuous     02/09/18 0526        Code Status History    Date Active Date Inactive Code Status Order ID Comments User Context   01/16/2018 1615 01/20/2018 1659 Full Code [id number]  Shelly Coss, MD ED   10/06/2016 1446 10/07/2016 1854 Full Code [id number]  Waldemar Dickens, MD Inpatient   05/05/2015 1140 05/09/2015 1834 Full Code [id number]  [name], MD Inpatient   08/22/2014 1438 08/23/2014 1354 Full Code [id number]  Ailene Rud, MD Inpatient   01/17/2014 1402 01/21/2014 1818 Full Code [id number]  Chriss Czar, PA-C Inpatient    Advance Directive Documentation     Most Recent Value  Type of Advance Directive  Healthcare Power of Attorney, Living will  Pre-existing out of facility DNR order (yellow form or pink MOST form)  -  "MOST" Form in Place?  -      Home/SNF/Other Home  Chief Complaint Altered Mental Status  Level of Care/Admitting Diagnosis ED Disposition    ED Disposition Condition [place]: [facility] [[id number]]  Level of Care: Med-Surg [16]  Diagnosis: UTI (urinary tract infection) [390300]  Admitting Physician: Vianne Bulls [[id number]]  Attending Physician: Vianne Bulls [[id number]]  PT Class (Do Not Modify): Observation [104]  PT Acc Code (Do Not Modify): Observation [10022]       Medical History Past Medical History:  Diagnosis Date  . Bradycardia 10/06/2016  . CHF (congestive heart failure) ([place])   . CKD stage 4 due to type 2 diabetes mellitus ([place]) 06/03/2015  . Dyslipidemia associated with type 2 diabetes mellitus ([place]) 05/05/2015  . Essential hypertension 05/05/2015  . IDDM (insulin dependent diabetes mellitus) ([place])   . SDAT (senile dementia of Alzheimer's type) 06/16/2013  . Uncontrolled  insulin-dependent diabetes mellitus with renal manifestation ([place]) 05/05/2015    Allergies Allergies  Allergen Reactions  . Ace Inhibitors Other (See Comments) and Cough    Chronic cough  . Demerol Other (See Comments)    Hard to wake up  . Cosopt [Dorzolamide Hcl-Timolol Mal] Other (See Comments)    Cause heart rate to drop  . Flomax [Tamsulosin Hcl] Other (See Comments)    Low BP  . Morphine And Related Other (See Comments)    hallucinations  . Oxytrol [Oxybutynin] Rash    IV Location/Drains/Wounds Patient Lines/Drains/Airways Status   Active Line/Drains/Airways    Name:   Placement date:   Placement time:   Site:   Days:   Peripheral IV 02/09/18 Left Antecubital   02/09/18    0022    Antecubital   less than 1   External Urinary Catheter   05/05/15    1200    -   1011   External Urinary Catheter   10/06/16    1500    -   491   Wound / Incision (Open or Dehisced) 01/16/18 Venous stasis ulcer Leg Left 5.1cm x 3.6 cm red open wound to LLE   01/16/18    1817    Leg   24          Labs/Imaging Results for orders placed or performed during the hospital encounter of 02/08/18 (from the  past 48 hour(s))  Blood culture (routine x 2)     Status: None (Preliminary result)   Collection Time: 02/09/18 12:16 AM  Result Value Ref Range   Specimen Description      BLOOD LEFT FOREARM Performed at [facility], [place] [address], [place], [place] [address]    Special Requests      BOTTLES DRAWN AEROBIC AND ANAEROBIC Blood Culture adequate volume Performed at [place] [address], [place], [place] [address]    Culture PENDING    Report Status PENDING   CBC with Differential/Platelet     Status: Abnormal   Collection Time: 02/09/18 12:16 AM  Result Value Ref Range   WBC 5.5 4.0 - 10.5 K/uL   RBC 4.15 (L) 4.22 - 5.81 MIL/uL   Hemoglobin 12.1 (L) 13.0 - 17.0 g/dL   HCT 38.2 (L) 39.0 - 52.0 %   MCV 92.0 78.0 - 100.0 fL   MCH 29.2 26.0 - 34.0 pg   MCHC  31.7 30.0 - 36.0 g/dL   RDW 14.5 11.5 - 15.5 %   Platelets 218 150 - 400 K/uL   Neutrophils Relative % 47 %   Neutro Abs 2.6 1.7 - 7.7 K/uL   Lymphocytes Relative 39 %   Lymphs Abs 2.1 0.7 - 4.0 K/uL   Monocytes Relative 11 %   Monocytes Absolute 0.6 0.1 - 1.0 K/uL   Eosinophils Relative 2 %   Eosinophils Absolute 0.1 0.0 - 0.7 K/uL   Basophils Relative 1 %   Basophils Absolute 0.0 0.0 - 0.1 K/uL    Comment: Performed at [facility], [place] [address], [place], [place] [address]  Comprehensive metabolic panel     Status: Abnormal   Collection Time: 02/09/18 12:16 AM  Result Value Ref Range   Sodium 149 (H) 135 - 145 mmol/L   Potassium 4.5 3.5 - 5.1 mmol/L   Chloride 112 (H) 98 - 111 mmol/L   CO2 26 22 - 32 mmol/L   Glucose, Bld 88 70 - 99 mg/dL   BUN 38 (H) 8 - 23 mg/dL   Creatinine, Ser 2.21 (H) 0.61 - 1.24 mg/dL   Calcium 9.4 8.9 - 10.3 mg/dL   Total Protein 7.1 6.5 - 8.1 g/dL   Albumin 4.0 3.5 - 5.0 g/dL   AST 44 (H) 15 - 41 U/L   ALT 19 0 - 44 U/L   Alkaline Phosphatase 55 38 - 126 U/L   Total Bilirubin 1.2 0.3 - 1.2 mg/dL   GFR calc non Af Amer 25 (L) >60 mL/min   GFR calc Af Amer 28 (L) >60 mL/min    Comment: (NOTE) The eGFR has been calculated using the CKD EPI equation. This calculation has not been validated in all clinical situations. eGFR's persistently <60 mL/min signify possible Chronic Kidney Disease.    Anion gap 11 5 - 15    Comment: Performed at [facility], [place] [address] [place], [place] [address]  Troponin I     Status: Abnormal   Collection Time: 02/09/18 12:16 AM  Result Value Ref Range   Troponin I 0.03 (HH) <0.03 ng/mL    Comment: CRITICAL RESULT CALLED TO, READ BACK BY AND VERIFIED WITH: [facility] RN AT 6811 02/09/18 BY TIBBITTS,K Performed at [facility], [place] [address], [place],  [address]   Urinalysis, Routine w reflex microscopic     Status: Abnormal   Collection Time: 02/09/18   2:12 AM  Result Value Ref Range   Color, Urine YELLOW YELLOW   APPearance CLEAR CLEAR   Specific Gravity, Urine 1.014 1.005 - 1.030   pH 5.0 5.0 - 8.0   Glucose, UA NEGATIVE NEGATIVE mg/dL   Hgb urine dipstick NEGATIVE NEGATIVE   Bilirubin Urine NEGATIVE NEGATIVE   Ketones, ur NEGATIVE NEGATIVE mg/dL   Protein, ur NEGATIVE NEGATIVE mg/dL   Nitrite NEGATIVE NEGATIVE   Leukocytes, UA SMALL (A) NEGATIVE   RBC / HPF 0-5 0 - 5 RBC/hpf   WBC, UA 21-50 0 - 5 WBC/hpf   Bacteria, UA RARE (A) NONE SEEN   Squamous Epithelial / LPF 0-5 0 - 5   Mucus PRESENT     Comment: Performed at [facility], [place] [address] [place], [place] [address]   Ct Head Wo Contrast  Result Date: 02/09/2018 CLINICAL DATA:  Altered mental status EXAM: CT HEAD WITHOUT CONTRAST TECHNIQUE: Contiguous axial images were obtained from the base of the skull through the vertex without intravenous contrast. COMPARISON:  CT 05/05/2015 FINDINGS: Brain: No acute territorial infarction, hemorrhage or intracranial mass. Atrophy and small vessel ischemic changes of the white matter. Stable ventricle size Vascular: No hyperdense vessels.  Carotid vascular calcification. Skull: No fracture.  Fluid in the left mastoid Sinuses/Orbits: No acute finding. Other: None IMPRESSION: 1. No CT evidence for acute intracranial abnormality. 2. Atrophy and small vessel ischemic changes of the white matter Electronically Signed   By: Donavan Foil M.D.   On: 02/09/2018 00:36   Dg Chest Port 1 View  Result Date: 02/09/2018 CLINICAL DATA:  Altered mental status EXAM: PORTABLE CHEST 1 VIEW COMPARISON:  01/16/2018, 10/06/2016 FINDINGS: Cardiomegaly with vascular congestion. Patchy bibasilar atelectasis. No pleural effusion. No pneumothorax. IMPRESSION: Cardiomegaly with vascular congestion and bibasilar atelectasis Electronically Signed   By: Donavan Foil M.D.   On: 02/09/2018 00:33    Pending Labs Unresulted Labs (From admission, onward)     Start     Ordered   02/10/18 7425  Basic metabolic panel  Tomorrow morning,   R     02/09/18 0526   02/09/18 0010  Urine culture  STAT,   STAT     02/09/18 0012   02/09/18 0010  Blood culture (routine x 2)  BLOOD CULTURE X 2,   STAT     02/09/18 0012          Vitals/Pain Today's Vitals   02/09/18 0415 02/09/18 0427 02/09/18 0430 02/09/18 0546  BP:   (!) 168/73 (!) 152/74  Pulse: 61 60  (!) 55  Resp:    17  Temp:      TempSrc:      SpO2: 100% 100%  98%  Weight:      Height:        Isolation Precautions No active isolations  Medications Medications  aspirin tablet 81 mg (has no administration in time range)  amLODipine (NORVASC) tablet 10 mg (has no administration in time range)  carvedilol (COREG) tablet 6.25 mg (has no administration in time range)  cloNIDine (CATAPRES) tablet 0.1 mg (has no administration in time range)  hydrALAZINE (APRESOLINE) tablet 100 mg (has no administration in time range)  mirabegron ER (MYRBETRIQ) tablet 50 mg (has no administration in time range)  Vitamin D3 CAPS 5,000 Units (has no administration in time range)  multivitamin tablet 1 tablet (has no administration in time range)  latanoprost (XALATAN) 0.005 % ophthalmic solution 1 drop (has no administration in time range)  brinzolamide (AZOPT) 1 %  ophthalmic suspension 1 drop (has no administration in time range)  brimonidine (ALPHAGAN) 0.15 % ophthalmic solution 1 drop (has no administration in time range)  heparin injection 5,000 Units (has no administration in time range)  sodium chloride flush (NS) 0.9 % injection 3 mL (has no administration in time range)  sodium chloride flush (NS) 0.9 % injection 3 mL (has no administration in time range)  0.9 %  sodium chloride infusion (has no administration in time range)  acetaminophen (TYLENOL) tablet 650 mg (has no administration in time range)    Or  acetaminophen (TYLENOL) suppository 650 mg (has no administration in time range)   senna-docusate (Senokot-S) tablet 1 tablet (has no administration in time range)  ondansetron (ZOFRAN) tablet 4 mg (has no administration in time range)    Or  ondansetron (ZOFRAN) injection 4 mg (has no administration in time range)  insulin aspart (novoLOG) injection 0-9 Units (has no administration in time range)  insulin aspart (novoLOG) injection 0-5 Units (has no administration in time range)  cefTRIAXone (ROCEPHIN) 1 g in sodium chloride 0.9 % 100 mL IVPB (has no administration in time range)  haloperidol lactate (HALDOL) injection 1 mg (has no administration in time range)  0.9 %  sodium chloride infusion ( Intravenous New Bag/Given 02/09/18 0032)  cefTRIAXone (ROCEPHIN) 1 g in sodium chloride 0.9 % 100 mL IVPB (1 g Intravenous New Bag/Given 02/09/18 0406)    Mobility walks

## 2018-02-09 NOTE — H&P (Signed)
History and Physical    Gregory Hatfield:[id number] DOB: 03/22/1926 DOA: 02/08/2018  PCP: [name], MD   Patient coming from: home   Chief Complaint: Increased confusion, agitation, malodorous urine   HPI: Gregory Hatfield is a 84 y.o. male with medical history significant for Alzheimer dementia, chronic diastolic CHF, insulin-dependent diabetes mellitus, chronic kidney disease stage IV, and hypertension, now presenting to the emergency department for evaluation of increased confusion, agitation, and foul-smelling urine.  Patient was admitted to the hospital earlier this month with acute on chronic CHF.  He was back to baseline at home after hospital discharge, able to ambulate, feed himself, and dress himself, but over the past 2 to 3 days, he has been increasingly confused and agitated, not eating much, unable to feed or dress himself, and with increasing difficulty ambulating.  Family has noted that his urine has developed a foul odor over the past couple days.  There was no recent fall or trauma reported and the patient has not expressed any specific complaints.  EMS was called out overnight and the patient was treated with Haldol and Versed prior to arrival in the ED.  ED Course: Upon arrival to the ED, patient is found to be afebrile, saturating well on room air, bradycardic in the 50s, and with stable blood pressure.  Chemistry panel is notable for sodium of 149, BUN 38, and creatinine 2.21, similar to recent priors.  CBC is unremarkable.  Urinalysis is consistent with possible infection.  Noncontrast head CT is negative for acute intracranial abnormality.  Chest x-ray is notable for cardiomegaly with vascular congestion and bibasilar atelectasis.  Blood and urine cultures were collected, IV fluid hydration was provided, and the patient was treated with empiric Rocephin in the ED.  He remains hemodynamically stable and will be observed in the hospital for ongoing evaluation and  management.  Review of Systems:  Unable to complete ROS secondary to patient's clinical condition.  Past Medical History:  Diagnosis Date  . Bradycardia 10/06/2016  . CHF (congestive heart failure) ([place])   . CKD stage 4 due to type 2 diabetes mellitus ([place]) 06/03/2015  . Dyslipidemia associated with type 2 diabetes mellitus ([place]) 05/05/2015  . Essential hypertension 05/05/2015  . IDDM (insulin dependent diabetes mellitus) ([place])   . SDAT (senile dementia of Alzheimer's type) 06/16/2013  . Uncontrolled insulin-dependent diabetes mellitus with renal manifestation ([place]) 05/05/2015    Past Surgical History:  Procedure Laterality Date  . CARDIAC CATHETERIZATION  04/2004   normal r-sided pressure, mild pulm HTN  . CARPAL TUNNEL RELEASE Right   . CERVICAL SPINE SURGERY  1990's  . LUMBAR SPINE SURGERY  2003  . PROSTATE SURGERY  10/2010   transurethral resection   . TOTAL HIP ARTHROPLASTY Left 01/17/2014   Procedure: LEFT TOTAL HIP ARTHROPLASTY;  Surgeon: Yvette Rack., MD;  Location: [facility];  Service: Orthopedics;  Laterality: Left;  . TRANSTHORACIC ECHOCARDIOGRAM  05/2006   EF 60-70%; mild calcif of MV; mild MV regurg; LA mildly dilated  . TRANSURETHRAL RESECTION OF PROSTATE N/A 08/22/2014   Procedure: TRANSURETHRAL RESECTION OF THE PROSTATE (TURP);  Surgeon: Marcia Brash, MD;  Location: [facility];  Service: Urology;  Laterality: N/A;     reports that he quit smoking about 34 years ago. He has never used smokeless tobacco. He reports that he does not drink alcohol or use drugs.  Allergies  Allergen Reactions  . Ace Inhibitors Other (See Comments) and Cough    Chronic cough  .  Demerol Other (See Comments)    Hard to wake up  . Cosopt [Dorzolamide Hcl-Timolol Mal] Other (See Comments)    Cause heart rate to drop  . Flomax [Tamsulosin Hcl] Other (See Comments)    Low BP  . Morphine And Related Other (See Comments)    hallucinations  . Oxytrol [Oxybutynin] Rash    Family History   Problem Relation Age of Onset  . Diabetes Mother   . Hypertension Mother   . Prostate cancer Father   . Heart disease Father   . Diabetes Brother   . Cancer Brother   . Heart disease Brother   . Diabetes Sister   . Heart disease Sister   . Hyperlipidemia Child   . Hypertension Child   . Diabetes Child   . Heart attack Child   . Cancer Child      Prior to Admission medications   Medication Sig Start Date End Date Taking? Authorizing Provider  amLODipine (NORVASC) 10 MG tablet Take 1 tablet (10 mg total) by mouth daily. 01/21/18  Yes Shelly Coss, MD  aspirin 81 MG tablet Take 81 mg by mouth daily.   Yes [provider]  carvedilol (COREG) 6.25 MG tablet TAKE 1 TABLET BY MOUTH TWICE DAILY WITH A MEAL. 01/20/18  Yes [name], MD  Cholecalciferol (VITAMIN D3) 5000 UNITS CAPS Take 5,000 Units by mouth 2 (two) times daily.    Yes [provider]  cloNIDine (CATAPRES) 0.1 MG tablet Take 1 tablet (0.1 mg total) by mouth 2 (two) times daily. 01/20/18  Yes Shelly Coss, MD  furosemide (LASIX) 40 MG tablet Take 1.5 tablets (60 mg total) by mouth 2 (two) times daily. 01/20/18 02/19/18 Yes Shelly Coss, MD  hydrALAZINE (APRESOLINE) 100 MG tablet Take 1 tablet (100 mg total) by mouth 3 (three) times daily. KEEP OV. 12/27/17  Yes Almyra Deforest, PA  Insulin Detemir (LEVEMIR FLEXTOUCH) 100 UNIT/ML Pen Take 50 units in AM, 20 units in PM. 02/08/18  Yes Corbett, Caryl Pina, NP  LUMIGAN 0.01 % SOLN Place 1 drop into the right eye at bedtime.  03/29/13  Yes [provider]  Multiple Vitamin (MULTIVITAMIN) tablet Take 1 tablet by mouth daily.     Yes [provider]  MYRBETRIQ 50 MG TB24 tablet Take 50 mg by mouth daily.  12/10/14  Yes [provider]  potassium chloride SA (K-DUR,KLOR-CON) 20 MEQ tablet Take 1 tablet (20 mEq total) by mouth daily. 01/20/18 02/19/18 Yes Shelly Coss, MD  Probiotic Product (PROBIOTIC DAILY PO) Take 1 tablet by mouth daily.    Yes  [provider]  senna-docusate (SENOKOT-S) 8.6-50 MG tablet Take 1 tablet by mouth at bedtime. Patient taking differently: Take 1 tablet by mouth as needed.  05/09/15  Yes Eugenie Filler, MD  SIMBRINZA 1-0.2 % SUSP Place 1 drop into both eyes 3 (three) times daily. 02/28/13  Yes [provider]  B-D UF III MINI PEN NEEDLES 31G X 5 MM MISC USE FOR INJECTIONS AS DIRECTED 12/27/17   Renato Shin, MD  ONE TOUCH ULTRA TEST test strip CHECK BLOOD SUGAR 6 TIMES A DAY. 02/12/17   Renato Shin, MD    Physical Exam: Vitals:   02/09/18 0400 02/09/18 0415 02/09/18 0427 02/09/18 0430  BP: (!) 162/80   (!) 168/73  Pulse:  61 60   Resp:      Temp:      TempSrc:      SpO2:  100% 100%   Weight:  Height:          Constitutional: NAD, agitated Eyes: PERTLA, lids and conjunctivae normal ENMT: Mucous membranes are moist. Posterior pharynx clear of any exudate or lesions.   Neck: normal, supple, no masses, no thyromegaly Respiratory: clear to auscultation bilaterally, no wheezing, no crackles. Normal respiratory effort.    Cardiovascular: S1 & S2 heard, regular rate and rhythm. No significant JVD. Abdomen: No distension, no tenderness, soft. Bowel sounds active.  Musculoskeletal: no clubbing / cyanosis. No joint deformity upper and lower extremities.    Skin: no significant rashes, lesions, ulcers. Warm, dry, well-perfused. Neurologic: No gross facial asymmetry. Sensation intact. Moving all extremities.  Psychiatric: Alert and oriented to self, but not oriented to place, situation, or time. Agitated.      Labs on Admission: I have personally reviewed following labs and imaging studies  CBC: Recent Labs  Lab 02/08/18 0918 02/09/18 0016  WBC 5.1 5.5  NEUTROABS 2,586 2.6  HGB 12.1* 12.1*  HCT 37.2* 38.2*  MCV 89.6 92.0  PLT 217 846   Basic Metabolic Panel: Recent Labs  Lab 02/08/18 0918 02/09/18 0016  NA 144 149*  K 3.8 4.5  CL 108 112*  CO2 26 26  GLUCOSE  251* 88  BUN 38* 38*  CREATININE 2.14* 2.21*  CALCIUM 9.2 9.4   GFR: Estimated Creatinine Clearance: 26 mL/min (A) (by C-G formula based on SCr of 2.21 mg/dL (H)). Liver Function Tests: Recent Labs  Lab 02/08/18 0918 02/09/18 0016  AST 19 44*  ALT 17 19  ALKPHOS  --  55  BILITOT 0.4 1.2  PROT 6.7 7.1  ALBUMIN  --  4.0   No results for input(s): LIPASE, AMYLASE in the last 168 hours. No results for input(s): AMMONIA in the last 168 hours. Coagulation Profile: No results for input(s): INR, PROTIME in the last 168 hours. Cardiac Enzymes: Recent Labs  Lab 02/09/18 0016  TROPONINI 0.03*   BNP (last 3 results) No results for input(s): PROBNP in the last 8760 hours. HbA1C: No results for input(s): HGBA1C in the last 72 hours. CBG: No results for input(s): GLUCAP in the last 168 hours. Lipid Profile: No results for input(s): CHOL, HDL, LDLCALC, TRIG, CHOLHDL, LDLDIRECT in the last 72 hours. Thyroid Function Tests: No results for input(s): TSH, T4TOTAL, FREET4, T3FREE, THYROIDAB in the last 72 hours. Anemia Panel: No results for input(s): VITAMINB12, FOLATE, FERRITIN, TIBC, IRON, RETICCTPCT in the last 72 hours. Urine analysis:    Component Value Date/Time   COLORURINE YELLOW 02/09/2018 0212   APPEARANCEUR CLEAR 02/09/2018 0212   LABSPEC 1.014 02/09/2018 0212   PHURINE 5.0 02/09/2018 0212   GLUCOSEU NEGATIVE 02/09/2018 0212   HGBUR NEGATIVE 02/09/2018 0212   BILIRUBINUR NEGATIVE 02/09/2018 0212   KETONESUR NEGATIVE 02/09/2018 0212   PROTEINUR NEGATIVE 02/09/2018 0212   UROBILINOGEN 0.2 05/05/2015 1023   NITRITE NEGATIVE 02/09/2018 0212   LEUKOCYTESUR SMALL (A) 02/09/2018 0212   Sepsis Labs: @LABRCNTIP (procalcitonin:4,lacticidven:4) ) Recent Results (from the past 240 hour(s))  Blood culture (routine x 2)     Status: None (Preliminary result)   Collection Time: 02/09/18 12:16 AM  Result Value Ref Range Status   Specimen Description   Final    BLOOD LEFT  FOREARM Performed at [facility], [place] [address], [place], [place] [address]    Special Requests   Final    BOTTLES DRAWN AEROBIC AND ANAEROBIC Blood Culture adequate volume Performed at [facility] [address], [place], [place] [address]  Culture PENDING  Incomplete   Report Status PENDING  Incomplete     Radiological Exams on Admission: Ct Head Wo Contrast  Result Date: 02/09/2018 CLINICAL DATA:  Altered mental status EXAM: CT HEAD WITHOUT CONTRAST TECHNIQUE: Contiguous axial images were obtained from the base of the skull through the vertex without intravenous contrast. COMPARISON:  CT 05/05/2015 FINDINGS: Brain: No acute territorial infarction, hemorrhage or intracranial mass. Atrophy and small vessel ischemic changes of the white matter. Stable ventricle size Vascular: No hyperdense vessels.  Carotid vascular calcification. Skull: No fracture.  Fluid in the left mastoid Sinuses/Orbits: No acute finding. Other: None IMPRESSION: 1. No CT evidence for acute intracranial abnormality. 2. Atrophy and small vessel ischemic changes of the white matter Electronically Signed   By: Donavan Foil M.D.   On: 02/09/2018 00:36   Dg Chest Port 1 View  Result Date: 02/09/2018 CLINICAL DATA:  Altered mental status EXAM: PORTABLE CHEST 1 VIEW COMPARISON:  01/16/2018, 10/06/2016 FINDINGS: Cardiomegaly with vascular congestion. Patchy bibasilar atelectasis. No pleural effusion. No pneumothorax. IMPRESSION: Cardiomegaly with vascular congestion and bibasilar atelectasis Electronically Signed   By: Donavan Foil M.D.   On: 02/09/2018 00:33    EKG: Not performed.   Assessment/Plan  1. UTI  - Presents with 2-3 days of increased confusion and agitation; family reports that urine has become malodorous  - UA consistent with infection; he is not septic  - Blood and urine cultures collected in ED and empiric Rocephin was given  - Plan to continue empiric Rocephin while following  culture and clinical course   2. Acute encephalopathy  - Presents with increased confusion and agitation  - He is able to ambulate some, and able to dress and feed himself at baseline, but has not been able to do any of this in the past 2-3 days  - Head CT is negative for acute findings, TSH was normal last month  - Suspect this is secondary to UTI, addressed above    3. Type II DM  - A1c was 8.2% in June 2019  - He is managed with Levemir 50 units qAM and 20 units qPM at home but family reported that CBG's have been running low and he has not been eating much  - Check CBG's and start with SSI only initially   4. Chronic diastolic CHF  - Appears compensated  - Was admitted earlier this month with acute CHF, echo with preserved EF, weighed 101.6 kg on discharge, now down to 94 kg  - He was given IVF hydration in ED  - Hold Lasix initially, SLIV, follow daily wt and I/O's, continue Coreg    5. Hypertension  - BP at goal  - Continue Norvasc, Coreg, hydralazine, and clonidine     6. CKD stage IV  - SCr is 2.21 on admission, similar to recent priors  - Renally-dose medications     DVT prophylaxis: sq heparin  Code Status: Full  Family Communication: Discussed with patient  Consults called: None Admission status: Observation    Vianne Bulls, MD [facility] Pager [phone]  If 7PM-7AM, please contact night-coverage [email or web address] Password [facility]  02/09/2018, 5:26 AM

## 2018-02-09 NOTE — ED Notes (Signed)
Patient has new condom cath on due to patient removing the first one. Will continue to check for urine to collect a specimen.

## 2018-02-09 NOTE — Progress Notes (Signed)
Patient is confused, combative, and is refusing care. He took his condom catheter off and urinated in the bed. He would not allow his blood sugar to be taken, to take meds, or for me to assess him. Said "I'll punch you in the eye". Meds and assessment held at this time due to agitation. Spoke with attending MD to make him aware.

## 2018-02-09 NOTE — ED Notes (Signed)
Patient has condom cath on due to incontinence.

## 2018-02-09 NOTE — Progress Notes (Signed)
PROGRESS NOTE    Gregory Hatfield  UVO:[id number] DOB: Jan 13, 1926 DOA: 02/08/2018 PCP: [name], MD    Brief Narrative:  84 year old male who was admitted due to confusion, agitation and malodorous urine.  He does have significant past medical history for Alzheimer's dementia, chronic diastolic heart failure, type 2 diabetes mellitus, chronic kidney disease stage IV, and hypertension.  Over the last 2 to 3 days prior to hospitalization he was noted to have worsening confusion, agitation, decreased appetite and significant decrease in his functional physical capacity.  His urine had a full odor.  On his initial physical examination patient was agitated, combative, received Haldol and Versed in the ED.  His initial vital signs include a heart rate of 50 bpm, blood pressure 162/80, oxygen saturation 100%, he had moist mucous membranes, lungs clear to auscultation bilaterally, heart S1-S2 present rhythmic, abdomen soft nontender, no lower extremity edema, patient was agitated and combative.  Na 144, potassium 3.8, chloride 108, bicarb 26, glucose 251, BUN 38, creatinine 2.1, WBC 5.1, hemoglobin 12.1, hematocrit 37.2, platelets 217.  His urine analysis had greater than 60 white cells, specific gravity 1.017, 1+ protein.  Head CT with atrophy, no acute changes.  Chest x-ray with right rotation, hypoinflation, bibasilar atelectasis.  Patient was admitted to the hospital with working diagnosis of acute metabolic encephalopathy due to urinary tract infection, present on admission.  Assessment & Plan:   Principal Problem:   Acute lower UTI Active Problems:   SDAT (senile dementia of Alzheimer's type)   Insulin-requiring or dependent type II diabetes mellitus ([place])   Essential hypertension   CKD stage 4 due to type 2 diabetes mellitus ([facility])   Chronic diastolic CHF (congestive heart failure) ([place])   Acute encephalopathy   UTI (urinary tract infection)  1. Metabolic encephalopathy in the setting  of urinary tract infection. Patient continue to be agitated and occasionally combative, will increase dose of haldol to 2 mg as needed every 4 hours, will avoid benzodiazepines. His daughter is at the bedside. Continue IV antibiotic therapy with ceftriaxone, will follow on cultures, cell count and temperature curve. Will add olanzapine at night, per his daughter patient has not slept over last 3 days. Add thiamine and multivitamins. Neuro checks q 4 hours and aspiration precautions.   2. T2DM. Patient has bee refusing capillary glucose monitoring, will continue to attempt blood sample, close neuro checks. As needed haldol. At home on insulin therapy.   3. Diastolic heart failure, chronic and stable with no signs of exacerbation. Will continue gentle hydration, no signs of volume overload, will continue to hold on furosemide. Telemetry monitoring.   4. HTN. Patient on amlodipine, carvedilol, hydralazine and clonidine. Has been refusing po meds, will change clonidine to transdermal patch to avoid rebound hypertension.   5. AKI on Stage IV CKD with hypernatremia. Will continue gentle hydration, avoid hypotension and nephrotoxic medications, hold on furosemide. Will change fluids to half normal saline to prevent worsening hypernatremia and hyperchloremia. Serum cr at 2,2 with K at 4,5, Na 149, cl 112 and serum bicarbonate at 26.   DVT prophylaxis: heparin   Code Status: full Family Communication: I spoke with patient's daughter at the bedside and all questions were addressed.  Disposition Plan/ discharge barriers: pending clinical improvement    Consultants:     Procedures:     Antimicrobials:   IV ceftriaxone     Subjective: Patient continue to be very agitated and confused, he is able to answer simple questions but easy distracted,  no chest pain, dyspnea, nausea or vomiting. Has been declining to have his capillary glucose checked.   Objective: Vitals:   02/09/18 0415 02/09/18 0427  02/09/18 0430 02/09/18 0546  BP:   (!) 168/73 (!) 152/74  Pulse: 61 60  (!) 55  Resp:    17  Temp:      TempSrc:      SpO2: 100% 100%  98%  Weight:      Height:       No intake or output data in the 24 hours ending 02/09/18 0853 Filed Weights   02/09/18 0033  Weight: 93.9 kg    Examination:   General:deconditioned and ill looking appearing Neurology: Awake and alert, non focal. inattentive and disorganized thinking.   E ENT: positive pallor, no icterus, oral mucosa dry Cardiovascular: No JVD. S1-S2 present, rhythmic, no gallops, rubs, or murmurs. No lower extremity edema. Pulmonary: positive breath sounds bilaterally, adequate air movement, no wheezing, rhonchi or rales. Gastrointestinal. Abdomen with no organomegaly, non tender, no rebound or guarding Skin. No rashes Musculoskeletal: no joint deformities     Data Reviewed: I have personally reviewed following labs and imaging studies  CBC: Recent Labs  Lab 02/08/18 0918 02/09/18 0016  WBC 5.1 5.5  NEUTROABS 2,586 2.6  HGB 12.1* 12.1*  HCT 37.2* 38.2*  MCV 89.6 92.0  PLT 217 355   Basic Metabolic Panel: Recent Labs  Lab 02/08/18 0918 02/09/18 0016  NA 144 149*  K 3.8 4.5  CL 108 112*  CO2 26 26  GLUCOSE 251* 88  BUN 38* 38*  CREATININE 2.14* 2.21*  CALCIUM 9.2 9.4   GFR: Estimated Creatinine Clearance: 26 mL/min (A) (by C-G formula based on SCr of 2.21 mg/dL (H)). Liver Function Tests: Recent Labs  Lab 02/08/18 0918 02/09/18 0016  AST 19 44*  ALT 17 19  ALKPHOS  --  55  BILITOT 0.4 1.2  PROT 6.7 7.1  ALBUMIN  --  4.0   No results for input(s): LIPASE, AMYLASE in the last 168 hours. No results for input(s): AMMONIA in the last 168 hours. Coagulation Profile: No results for input(s): INR, PROTIME in the last 168 hours. Cardiac Enzymes: Recent Labs  Lab 02/09/18 0016  TROPONINI 0.03*   BNP (last 3 results) No results for input(s): PROBNP in the last 8760 hours. HbA1C: No results for  input(s): HGBA1C in the last 72 hours. CBG: No results for input(s): GLUCAP in the last 168 hours. Lipid Profile: No results for input(s): CHOL, HDL, LDLCALC, TRIG, CHOLHDL, LDLDIRECT in the last 72 hours. Thyroid Function Tests: No results for input(s): TSH, T4TOTAL, FREET4, T3FREE, THYROIDAB in the last 72 hours. Anemia Panel: No results for input(s): VITAMINB12, FOLATE, FERRITIN, TIBC, IRON, RETICCTPCT in the last 72 hours.    Radiology Studies: I have reviewed all of the imaging during this hospital visit personally     Scheduled Meds: . amLODipine  10 mg Oral Daily  . aspirin EC  81 mg Oral Daily  . brimonidine  1 drop Both Eyes Q8H  . brinzolamide  1 drop Both Eyes TID  . carvedilol  6.25 mg Oral BID WC  . cholecalciferol  5,000 Units Oral BID  . cloNIDine  0.1 mg Oral BID  . heparin  5,000 Units Subcutaneous Q8H  . hydrALAZINE  100 mg Oral TID  . insulin aspart  0-5 Units Subcutaneous QHS  . insulin aspart  0-9 Units Subcutaneous TID WC  . latanoprost  1 drop Right Eye QHS  .  mirabegron ER  50 mg Oral Daily  . multivitamin with minerals  1 tablet Oral Daily  . sodium chloride flush  3 mL Intravenous Q12H   Continuous Infusions: . sodium chloride    . [START ON 02/10/2018] cefTRIAXone (ROCEPHIN)  IV       LOS: 0 days        Mauricio Gerome Apley, MD [facility] Pager [phone]

## 2018-02-09 NOTE — Progress Notes (Signed)
Initial Nutrition Assessment  DOCUMENTATION CODES:   Not applicable  INTERVENTION:   Ensure Enlive po BID, each supplement provides 350 kcal and 20 grams of protein  NUTRITION DIAGNOSIS:   Inadequate oral intake related to lethargy/confusion as evidenced by per patient/family report.  GOAL:   Patient will meet greater than or equal to 90% of their needs  MONITOR:   PO intake, Weight trends, Labs, Supplement acceptance  REASON FOR ASSESSMENT:   Consult Assessment of nutrition requirement/status  ASSESSMENT:   Patient with PMH significant for Alzheimer dementia, chronic diastolic CHF, insulin-dependent DM, CKD stage IV, and HTN, now presenting to the emergency department for evaluation of increased confusion, agitation, and foul-smelling urine. Admitted for UTI and acute encephalopathy.    Pt confused, combative, and could not answer RD questions. No family at bedside to provide any further history. Per H&P, pt has been refusing to eat for the last couple of days. Spoke with RN who is unsure ir pt has ate this admission. No meal completions charted. RD to provide Ensure to maximize calories and protein this admission.   Unsure of pt's UBW. Records indicate pt weighed 224 lb 12/20/17 and 207 lb this admission. Unsure if this is dry weight loss or fluid loss given history of CHF. Will need to obtain dietary recall for supporting evidence.   Unable to complete Nutrition-Focused physical exam at this time as pt is combative.   Medications reviewed and include: Vit D, geriatric MVI, thiamine Labs reviewed: Na 149 (L)   Diet Order:   Diet Order            Diet heart healthy/carb modified Room service appropriate? Yes; Fluid consistency: Thin  Diet effective now              EDUCATION NEEDS:   Not appropriate for education at this time  Skin:  Skin Assessment: Skin Integrity Issues: Skin Integrity Issues:: Stage II, Other (Comment) Stage II: buttocks Other: open wound  LLE  Last BM:  PTA  Height:   Ht Readings from Last 1 Encounters:  02/09/18 5\' 11"  (1.803 m)    Weight:   Wt Readings from Last 1 Encounters:  02/09/18 93.9 kg    Ideal Body Weight:  78.2 kg  BMI:  Body mass index is 28.87 kg/m.  Estimated Nutritional Needs:   Kcal:  2100-2300 kcal  Protein:  105-120 grams   Fluid:  >/= 2.1 L/day   Mariana Single RD, LDN Clinical Nutrition Pager # - [phone]

## 2018-02-09 NOTE — Progress Notes (Signed)
Patient incontinent of bowel and bladder. Refusing dinner. Confused and combative; Haldol given. Patient trying to hit, bite and kick staff. Would not allow CBG. Security called to assist with patient to keep staff safe during peri care and linen/gown change. Donne Hazel, RN

## 2018-02-09 NOTE — ED Triage Notes (Signed)
Pts daughter states that he hasn't been the same for the last few days, pt has been lethargic and severe sundowners Pt gets very agitated and aggressive at night

## 2018-02-09 NOTE — ED Notes (Signed)
Please call Candler Ginsberg (daughter) for any changes in disposition of patient. Her phone number is [phone].

## 2018-02-09 NOTE — ED Notes (Signed)
Attempted report to receiving RN.

## 2018-02-09 NOTE — ED Provider Notes (Signed)
[name] DEPT Provider Note: Gregory Spurling, MD, FACEP  CSN: [id number] MRN: [id number] ARRIVAL: 02/08/18 at 2329 ROOM: [place]  Altered Mental Status  Level 5 caveat: Dementia; altered mental status HISTORY OF PRESENT ILLNESS  02/09/18 12:09 AM Gregory Hatfield is a 84 y.o. male with a history of Alzheimer's disease.  He had previously been able to take care of himself, dressing himself, feeding himself and taking his own medications with some assistance.  He has had a decline in his mental status over the past 5 days.  He has had periods of agitation and insomnia alternating with periods of somnolence.  He has been more paranoid and confused than usual.  He has been locking his family out of the house.  He is fearful that they are trying to poison him.  Family has noticed that his urine is foul-smelling and are concerned he has a urinary tract infection.  He has chronic wounds of the lower extremities which were dressed yesterday.  He also has small decubitus ulcers of the buttocks which were dressed as well.   Past Medical History:  Diagnosis Date  . Bradycardia 10/06/2016  . CHF (congestive heart failure) ([place])   . CKD stage 4 due to type 2 diabetes mellitus ([place]) 06/03/2015  . Dyslipidemia associated with type 2 diabetes mellitus ([place]) 05/05/2015  . Essential hypertension 05/05/2015  . IDDM (insulin dependent diabetes mellitus) ([place])   . SDAT (senile dementia of Alzheimer's type) 06/16/2013  . Uncontrolled insulin-dependent diabetes mellitus with renal manifestation ([place]) 05/05/2015    Past Surgical History:  Procedure Laterality Date  . CARDIAC CATHETERIZATION  04/2004   normal r-sided pressure, mild pulm HTN  . CARPAL TUNNEL RELEASE Right   . CERVICAL SPINE SURGERY  1990's  . LUMBAR SPINE SURGERY  2003  . PROSTATE SURGERY  10/2010   transurethral resection   . TOTAL HIP ARTHROPLASTY Left 01/17/2014   Procedure: LEFT TOTAL HIP ARTHROPLASTY;   Surgeon: Yvette Rack., MD;  Location: [facility];  Service: Orthopedics;  Laterality: Left;  . TRANSTHORACIC ECHOCARDIOGRAM  05/2006   EF 60-70%; mild calcif of MV; mild MV regurg; LA mildly dilated  . TRANSURETHRAL RESECTION OF PROSTATE N/A 08/22/2014   Procedure: TRANSURETHRAL RESECTION OF THE PROSTATE (TURP);  Surgeon: Marcia Brash, MD;  Location: [facility];  Service: Urology;  Laterality: N/A;    Family History  Problem Relation Age of Onset  . Diabetes Mother   . Hypertension Mother   . Prostate cancer Father   . Heart disease Father   . Diabetes Brother   . Cancer Brother   . Heart disease Brother   . Diabetes Sister   . Heart disease Sister   . Hyperlipidemia Child   . Hypertension Child   . Diabetes Child   . Heart attack Child   . Cancer Child     Social History   Tobacco Use  . Smoking status: Former Smoker    Last attempt to quit: 06/21/1983    Years since quitting: 34.6  . Smokeless tobacco: Never Used  Substance Use Topics  . Alcohol use: No  . Drug use: No    Prior to Admission medications   Medication Sig Start Date End Date Taking? Authorizing Provider  amLODipine (NORVASC) 10 MG tablet Take 1 tablet (10 mg total) by mouth daily. 01/21/18  Yes Shelly Coss, MD  aspirin 81 MG tablet Take 81 mg by mouth daily.   Yes [provider]  carvedilol (COREG) 6.25 MG tablet TAKE 1 TABLET BY MOUTH TWICE DAILY WITH A MEAL. 01/20/18  Yes [name], MD  Cholecalciferol (VITAMIN D3) 5000 UNITS CAPS Take 5,000 Units by mouth 2 (two) times daily.    Yes [provider]  cloNIDine (CATAPRES) 0.1 MG tablet Take 1 tablet (0.1 mg total) by mouth 2 (two) times daily. 01/20/18  Yes Shelly Coss, MD  furosemide (LASIX) 40 MG tablet Take 1.5 tablets (60 mg total) by mouth 2 (two) times daily. 01/20/18 02/19/18 Yes Shelly Coss, MD  hydrALAZINE (APRESOLINE) 100 MG tablet Take 1 tablet (100 mg total) by mouth 3 (three) times daily. KEEP OV. 12/27/17  Yes Almyra Deforest, PA  Insulin Detemir (LEVEMIR FLEXTOUCH) 100 UNIT/ML Pen Take 50 units in AM, 20 units in PM. 02/08/18  Yes Corbett, Caryl Pina, NP  LUMIGAN 0.01 % SOLN Place 1 drop into the right eye at bedtime.  03/29/13  Yes [provider]  Multiple Vitamin (MULTIVITAMIN) tablet Take 1 tablet by mouth daily.     Yes [provider]  MYRBETRIQ 50 MG TB24 tablet Take 50 mg by mouth daily.  12/10/14  Yes [provider]  potassium chloride SA (K-DUR,KLOR-CON) 20 MEQ tablet Take 1 tablet (20 mEq total) by mouth daily. 01/20/18 02/19/18 Yes Shelly Coss, MD  Probiotic Product (PROBIOTIC DAILY PO) Take 1 tablet by mouth daily.    Yes [provider]  senna-docusate (SENOKOT-S) 8.6-50 MG tablet Take 1 tablet by mouth at bedtime. Patient taking differently: Take 1 tablet by mouth as needed.  05/09/15  Yes Eugenie Filler, MD  SIMBRINZA 1-0.2 % SUSP Place 1 drop into both eyes 3 (three) times daily. 02/28/13  Yes [provider]  B-D UF III MINI PEN NEEDLES 31G X 5 MM MISC USE FOR INJECTIONS AS DIRECTED 12/27/17   Renato Shin, MD  ONE TOUCH ULTRA TEST test strip CHECK BLOOD SUGAR 6 TIMES A DAY. 02/12/17   Renato Shin, MD    Allergies Ace inhibitors; Demerol; Cosopt [dorzolamide hcl-timolol mal]; Flomax [tamsulosin hcl]; Morphine and related; and Oxytrol [oxybutynin]   REVIEW OF SYSTEMS     PHYSICAL EXAMINATION  Initial Vital Signs Blood pressure (!) 161/72, pulse (!) 58, temperature 97.8 F (36.6 C), temperature source Oral, resp. rate 18, SpO2 98 %.  Examination General: Well-developed, well-nourished male in no acute distress; appearance consistent with age of record HENT: normocephalic; atraumatic Eyes: Pupils unequal, sluggish; bilateral pseudophakia Neck: supple Heart: regular rate and rhythm Lungs: clear to auscultation bilaterally Abdomen: soft; nondistended; nontender; bowel sounds present Extremities: Arthritic changes; lower legs in occlusive  dressings Neurologic: Somnolent but arousable to voice; speech incomprehensible; noted to move all extremities Skin: Warm and dry; small, superficial grade 1/2 decubitus ulcers of medial buttocks   RESULTS  Summary of this visit's results, reviewed by myself:   EKG Interpretation  Date/Time:    Ventricular Rate:    PR Interval:    QRS Duration:   QT Interval:    QTC Calculation:   R Axis:     Text Interpretation:        Laboratory Studies: Results for orders placed or performed during the hospital encounter of 02/08/18 (from the past 24 hour(s))  Blood culture (routine x 2)     Status: None (Preliminary result)   Collection Time: 02/09/18 12:16 AM  Result Value Ref Range   Specimen Description      BLOOD LEFT FOREARM Performed at [facility], [address],  [place] [address]    Special Requests      BOTTLES DRAWN AEROBIC AND ANAEROBIC Blood Culture adequate volume Performed at [facility] [address], [place], [place] [address]    Culture PENDING    Report Status PENDING   CBC with Differential/Platelet     Status: Abnormal   Collection Time: 02/09/18 12:16 AM  Result Value Ref Range   WBC 5.5 4.0 - 10.5 K/uL   RBC 4.15 (L) 4.22 - 5.81 MIL/uL   Hemoglobin 12.1 (L) 13.0 - 17.0 g/dL   HCT 38.2 (L) 39.0 - 52.0 %   MCV 92.0 78.0 - 100.0 fL   MCH 29.2 26.0 - 34.0 pg   MCHC 31.7 30.0 - 36.0 g/dL   RDW 14.5 11.5 - 15.5 %   Platelets 218 150 - 400 K/uL   Neutrophils Relative % 47 %   Neutro Abs 2.6 1.7 - 7.7 K/uL   Lymphocytes Relative 39 %   Lymphs Abs 2.1 0.7 - 4.0 K/uL   Monocytes Relative 11 %   Monocytes Absolute 0.6 0.1 - 1.0 K/uL   Eosinophils Relative 2 %   Eosinophils Absolute 0.1 0.0 - 0.7 K/uL   Basophils Relative 1 %   Basophils Absolute 0.0 0.0 - 0.1 K/uL  Comprehensive metabolic panel     Status: Abnormal   Collection Time: 02/09/18 12:16 AM  Result Value Ref Range   Sodium 149 (H) 135 - 145 mmol/L    Potassium 4.5 3.5 - 5.1 mmol/L   Chloride 112 (H) 98 - 111 mmol/L   CO2 26 22 - 32 mmol/L   Glucose, Bld 88 70 - 99 mg/dL   BUN 38 (H) 8 - 23 mg/dL   Creatinine, Ser 2.21 (H) 0.61 - 1.24 mg/dL   Calcium 9.4 8.9 - 10.3 mg/dL   Total Protein 7.1 6.5 - 8.1 g/dL   Albumin 4.0 3.5 - 5.0 g/dL   AST 44 (H) 15 - 41 U/L   ALT 19 0 - 44 U/L   Alkaline Phosphatase 55 38 - 126 U/L   Total Bilirubin 1.2 0.3 - 1.2 mg/dL   GFR calc non Af Amer 25 (L) >60 mL/min   GFR calc Af Amer 28 (L) >60 mL/min   Anion gap 11 5 - 15  Troponin I     Status: Abnormal   Collection Time: 02/09/18 12:16 AM  Result Value Ref Range   Troponin I 0.03 (HH) <0.03 ng/mL  Urinalysis, Routine w reflex microscopic     Status: Abnormal   Collection Time: 02/09/18  2:12 AM  Result Value Ref Range   Color, Urine YELLOW YELLOW   APPearance CLEAR CLEAR   Specific Gravity, Urine 1.014 1.005 - 1.030   pH 5.0 5.0 - 8.0   Glucose, UA NEGATIVE NEGATIVE mg/dL   Hgb urine dipstick NEGATIVE NEGATIVE   Bilirubin Urine NEGATIVE NEGATIVE   Ketones, ur NEGATIVE NEGATIVE mg/dL   Protein, ur NEGATIVE NEGATIVE mg/dL   Nitrite NEGATIVE NEGATIVE   Leukocytes, UA SMALL (A) NEGATIVE   RBC / HPF 0-5 0 - 5 RBC/hpf   WBC, UA 21-50 0 - 5 WBC/hpf   Bacteria, UA RARE (A) NONE SEEN   Squamous Epithelial / LPF 0-5 0 - 5   Mucus PRESENT    Imaging Studies: Ct Head Wo Contrast  Result Date: 02/09/2018 CLINICAL DATA:  Altered mental status EXAM: CT HEAD WITHOUT CONTRAST TECHNIQUE: Contiguous axial images were obtained from the base of the skull through the vertex  without intravenous contrast. COMPARISON:  CT 05/05/2015 FINDINGS: Brain: No acute territorial infarction, hemorrhage or intracranial mass. Atrophy and small vessel ischemic changes of the white matter. Stable ventricle size Vascular: No hyperdense vessels.  Carotid vascular calcification. Skull: No fracture.  Fluid in the left mastoid Sinuses/Orbits: No acute finding. Other: None  IMPRESSION: 1. No CT evidence for acute intracranial abnormality. 2. Atrophy and small vessel ischemic changes of the white matter Electronically Signed   By: Donavan Foil M.D.   On: 02/09/2018 00:36   Dg Chest Port 1 View  Result Date: 02/09/2018 CLINICAL DATA:  Altered mental status EXAM: PORTABLE CHEST 1 VIEW COMPARISON:  01/16/2018, 10/06/2016 FINDINGS: Cardiomegaly with vascular congestion. Patchy bibasilar atelectasis. No pleural effusion. No pneumothorax. IMPRESSION: Cardiomegaly with vascular congestion and bibasilar atelectasis Electronically Signed   By: Donavan Foil M.D.   On: 02/09/2018 00:33    ED COURSE and MDM  Nursing notes and initial vitals signs, including pulse oximetry, reviewed.  Vitals:   02/09/18 0331 02/09/18 0357 02/09/18 0400 02/09/18 0415  BP:   (!) 162/80   Pulse: (!) 59 60  61  Resp:      Temp:      TempSrc:      SpO2: 100% 100%  100%  Weight:      Height:       Rocephin 1 g given for urinary tract infection.  Cultures pending.  [facility] hospitalist to admit.  Elevated troponin likely due to chronic congestive heart failure and renal insufficiency.  PROCEDURES    ED DIAGNOSES     ICD-10-CM   1. Lower urinary tract infectious disease N39.0   2. Altered mental status, unspecified altered mental status type R41.82        Norvil Martensen, Jenny Reichmann, MD 02/09/18 [phone]

## 2018-02-09 NOTE — ED Notes (Signed)
Unable to obtain second set of blood cultures.  

## 2018-02-09 NOTE — Progress Notes (Signed)
Unable to get blood draw or place dressing on open wounds on bilateral buttocks due to combativeness. Dr Cathlean Sauer aware. Donne Hazel, RN

## 2018-02-09 NOTE — ED Notes (Signed)
Date and time results received: 02/09/18 0142   Test: troponin Critical Value: 0.03  Name of Provider Notified: [place], MD  Orders Received? Or Actions Taken?:

## 2018-02-10 LAB — CBC WITH DIFFERENTIAL/PLATELET
BASOS ABS: 0 10*3/uL (ref 0.0–0.1)
Basophils Relative: 0 %
Eosinophils Absolute: 0.2 10*3/uL (ref 0.0–0.7)
Eosinophils Relative: 3 %
HEMATOCRIT: 39 % (ref 39.0–52.0)
Hemoglobin: 12.1 g/dL — ABNORMAL LOW (ref 13.0–17.0)
LYMPHS ABS: 2.3 10*3/uL (ref 0.7–4.0)
LYMPHS PCT: 34 %
MCH: 29 pg (ref 26.0–34.0)
MCHC: 31 g/dL (ref 30.0–36.0)
MCV: 93.5 fL (ref 78.0–100.0)
MONO ABS: 0.6 10*3/uL (ref 0.1–1.0)
Monocytes Relative: 9 %
NEUTROS ABS: 3.7 10*3/uL (ref 1.7–7.7)
Neutrophils Relative %: 54 %
Platelets: 203 10*3/uL (ref 150–400)
RBC: 4.17 MIL/uL — ABNORMAL LOW (ref 4.22–5.81)
RDW: 14.4 % (ref 11.5–15.5)
WBC: 6.9 10*3/uL (ref 4.0–10.5)

## 2018-02-10 LAB — BASIC METABOLIC PANEL
ANION GAP: 8 (ref 5–15)
BUN: 26 mg/dL — AB (ref 8–23)
CHLORIDE: 113 mmol/L — AB (ref 98–111)
CO2: 23 mmol/L (ref 22–32)
Calcium: 8.5 mg/dL — ABNORMAL LOW (ref 8.9–10.3)
Creatinine, Ser: 1.57 mg/dL — ABNORMAL HIGH (ref 0.61–1.24)
GFR calc Af Amer: 43 mL/min — ABNORMAL LOW (ref >60)
GFR, EST NON AFRICAN AMERICAN: 37 mL/min — AB (ref >60)
Glucose, Bld: 146 mg/dL — ABNORMAL HIGH (ref 70–99)
POTASSIUM: 3 mmol/L — AB (ref 3.5–5.1)
Sodium: 144 mmol/L (ref 135–145)

## 2018-02-10 LAB — GLUCOSE, CAPILLARY
GLUCOSE-CAPILLARY: 177 mg/dL — AB (ref 70–99)
Glucose-Capillary: 183 mg/dL — ABNORMAL HIGH (ref 70–99)
Glucose-Capillary: 222 mg/dL — ABNORMAL HIGH (ref 70–99)

## 2018-02-10 MED ORDER — INSULIN DETEMIR 100 UNIT/ML ~~LOC~~ SOLN
5.0000 [IU] | Freq: Two times a day (BID) | SUBCUTANEOUS | Status: DC
  Administered 2018-02-10 – 2018-02-12 (×6): 5 [IU] via SUBCUTANEOUS
  Filled 2018-02-10 (×6): qty 0.05

## 2018-02-10 MED ORDER — POTASSIUM CHLORIDE CRYS ER 20 MEQ PO TBCR
40.0000 meq | EXTENDED_RELEASE_TABLET | ORAL | Status: AC
  Administered 2018-02-10 (×2): 40 meq via ORAL
  Filled 2018-02-10 (×2): qty 2

## 2018-02-10 NOTE — Progress Notes (Signed)
PROGRESS NOTE    Gregory Hatfield  DDU:[id number] DOB: [birth year] DOA: 02/08/2018 PCP: [name], MD  Brief Narrative:84 year old male who was admitted due to confusion, agitation and malodorous urine.  He does have significant past medical history for Alzheimer's dementia, chronic diastolic heart failure, type 2 diabetes mellitus, chronic kidney disease stage IV, and hypertension.  Over the last 2 to 3 days prior to hospitalization he was noted to have worsening confusion, agitation, decreased appetite and significant decrease in his functional physical capacity.  His urine had a full odor.  On his initial physical examination patient was agitated, combative, received Haldol and Versed in the ED.  His initial vital signs include a heart rate of 50 bpm, blood pressure 162/80, oxygen saturation 100%, he had moist mucous membranes, lungs clear to auscultation bilaterally, heart S1-S2 present rhythmic, abdomen soft nontender, no lower extremity edema, patient was agitated and combative.  Na 144, potassium 3.8, chloride 108, bicarb 26, glucose 251, BUN 38, creatinine 2.1, WBC 5.1, hemoglobin 12.1, hematocrit 37.2, platelets 217.  His urine analysis had greater than 60 white cells, specific gravity 1.017, 1+ protein.  Head CT with atrophy, no acute changes.  Chest x-ray with right rotation, hypoinflation, bibasilar atelectasis.  Patient was admitted to the hospital with working diagnosis of acute metabolic encephalopathy due to urinary tract infection, present on admission.  Assessment & Plan:   Principal Problem:   Acute lower UTI Active Problems:   SDAT (senile dementia of Alzheimer's type)   Insulin-requiring or dependent type II diabetes mellitus ([place])   Essential hypertension   CKD stage 4 due to type 2 diabetes mellitus ([facility])   Chronic diastolic CHF (congestive heart failure) ([facility])   Acute encephalopathy   UTI (urinary tract infection)   Metabolic encephalopathy   Pressure injury of  skin  1. Metabolic encephalopathy in the setting of urinary tract infection. Patient continue to be agitated and occasionally combative, will increase dose of haldol to 2 mg as needed every 4 hours, will avoid benzodiazepines. Continue IV antibiotic therapy with ceftriaxone,urine culture more than 100,000 colonies unidentified organisms sensitivity pending. Will add olanzapine at night, per his daughter patient has not slept over last 3 days. Add thiamine and multivitamins. Neuro checks q 4 hours and aspiration precautions.  Follow-up on the final urine culture continue IV ceftriaxone patient family reports he was not this confused at home.  PT evaluation.  2. T2DM-restart detemir insulin 5 units twice a day.    3. Diastolic heart failure, chronic and stable with no signs of exacerbation. Will continue gentle hydration, no signs of volume overload, will continue to hold on furosemide. Telemetry monitoring.   4. HTN. Patient on amlodipine, carvedilol, hydralazine and clonidine. Has been refusing po meds, will change clonidine to transdermal patch to avoid rebound hypertension.   5. AKI on Stage IV CKD with hypernatremia. Will continue gentle hydration, avoid hypotension and nephrotoxic medications, hold on furosemide.  Sodium down to 144.  Serum creatinine down to 1.57 with a K of 3.0.  Replete potassium and continue slow IV hydration.   DVT prophylaxis subcu heparin Code Status: Full code Family Communication no family available Disposition Plan: TBD Consultants: None  Procedures: None Antimicrobials: IV ceftriaxone Subjective: Patient resting in bed less agitated received Haldol last night and this morning.  Denies any nausea vomiting he cannot tell me if he had a bowel movement or not.  Objective: Vitals:   02/09/18 0430 02/09/18 0546 02/09/18 2154 02/10/18 0516  BP: (!) 168/73 Marland Kitchen)  152/74 (!) 178/77 (!) 157/86  Pulse:  (!) 55 (!) 56 (!) 58  Resp:  17 18 18   Temp:   98.3 F (36.8  C) 97.6 F (36.4 C)  TempSrc:   Oral Oral  SpO2:  98% 98% 96%  Weight:    93.7 kg  Height:        Intake/Output Summary (Last 24 hours) at 02/10/2018 1105 Last data filed at 02/10/2018 1696 Gross per 24 hour  Intake 3173.19 ml  Output 450 ml  Net 2723.19 ml   Filed Weights   02/09/18 0033 02/10/18 0516  Weight: 93.9 kg 93.7 kg    Examination:  General exam: Appears calm and comfortable  Respiratory system: Clear to auscultation. Respiratory effort normal. Cardiovascular system: S1 & S2 heard, RRR. No JVD, murmurs, rubs, gallops or clicks. No pedal edema. Gastrointestinal system: Abdomen is DISTENDED, soft and nontender. No organomegaly or masses felt. Normal bowel sounds heard. Central nervous system: Confused Extremities: Symmetric 5 x 5 power. Skin: No rashes, lesions or ulcers     Data Reviewed: I have personally reviewed following labs and imaging studies  CBC: Recent Labs  Lab 02/08/18 0918 02/09/18 0016 02/10/18 0507  WBC 5.1 5.5 6.9  NEUTROABS 2,586 2.6 3.7  HGB 12.1* 12.1* 12.1*  HCT 37.2* 38.2* 39.0  MCV 89.6 92.0 93.5  PLT 217 218 789   Basic Metabolic Panel: Recent Labs  Lab 02/08/18 0918 02/09/18 0016 02/10/18 0507  NA 144 149* 144  K 3.8 4.5 3.0*  CL 108 112* 113*  CO2 26 26 23   GLUCOSE 251* 88 146*  BUN 38* 38* 26*  CREATININE 2.14* 2.21* 1.57*  CALCIUM 9.2 9.4 8.5*   GFR: Estimated Creatinine Clearance: 36.6 mL/min (A) (by C-G formula based on SCr of 1.57 mg/dL (H)). Liver Function Tests: Recent Labs  Lab 02/08/18 0918 02/09/18 0016  AST 19 44*  ALT 17 19  ALKPHOS  --  55  BILITOT 0.4 1.2  PROT 6.7 7.1  ALBUMIN  --  4.0   No results for input(s): LIPASE, AMYLASE in the last 168 hours. No results for input(s): AMMONIA in the last 168 hours. Coagulation Profile: No results for input(s): INR, PROTIME in the last 168 hours. Cardiac Enzymes: Recent Labs  Lab 02/09/18 0016  TROPONINI 0.03*   BNP (last 3 results) No results  for input(s): PROBNP in the last 8760 hours. HbA1C: No results for input(s): HGBA1C in the last 72 hours. CBG: Recent Labs  Lab 02/09/18 1204 02/09/18 2155  GLUCAP 115* 158*   Lipid Profile: No results for input(s): CHOL, HDL, LDLCALC, TRIG, CHOLHDL, LDLDIRECT in the last 72 hours. Thyroid Function Tests: No results for input(s): TSH, T4TOTAL, FREET4, T3FREE, THYROIDAB in the last 72 hours. Anemia Panel: No results for input(s): VITAMINB12, FOLATE, FERRITIN, TIBC, IRON, RETICCTPCT in the last 72 hours. Sepsis Labs: No results for input(s): PROCALCITON, LATICACIDVEN in the last 168 hours.  Recent Results (from the past 240 hour(s))  Urine Culture     Status: Abnormal (Preliminary result)   Collection Time: 02/08/18  9:18 AM  Result Value Ref Range Status   MICRO NUMBER: [id number]  Preliminary   SPECIMEN QUALITY: ADEQUATE  Preliminary   Sample Source URINE  Preliminary   STATUS: PRELIMINARY  Preliminary   ISOLATE 1: Gram positive cocci isolated (A)  Preliminary    Comment: Greater than 100,000 CFU/mL of Gram positive cocci isolated Identification and susceptibilities to follow.  Blood culture (routine x 2)  Status: None (Preliminary result)   Collection Time: 02/09/18 12:16 AM  Result Value Ref Range Status   Specimen Description   Final    BLOOD LEFT FOREARM Performed at [facility], [address], [place], [place] [address]    Special Requests   Final    BOTTLES DRAWN AEROBIC AND ANAEROBIC Blood Culture adequate volume Performed at [facility] [address], [place], [place] [address]    Culture PENDING  Incomplete   Report Status PENDING  Incomplete  Urine culture     Status: Abnormal (Preliminary result)   Collection Time: 02/09/18  2:12 AM  Result Value Ref Range Status   Specimen Description   Final    URINE, CLEAN CATCH Performed at [facility], [place] [address], [place], [place] [address]    Special Requests    Final    NONE Performed at [facility], [place] [address] [place], [place] [address]    Culture (A)  Final    >=100,000 COLONIES/mL UNIDENTIFIED ORGANISM Performed at [facility], [place] [address], [place], [place] [address]    Report Status PENDING  Incomplete         Radiology Studies: Ct Head Wo Contrast  Result Date: 02/09/2018 CLINICAL DATA:  Altered mental status EXAM: CT HEAD WITHOUT CONTRAST TECHNIQUE: Contiguous axial images were obtained from the base of the skull through the vertex without intravenous contrast. COMPARISON:  CT 05/05/2015 FINDINGS: Brain: No acute territorial infarction, hemorrhage or intracranial mass. Atrophy and small vessel ischemic changes of the white matter. Stable ventricle size Vascular: No hyperdense vessels.  Carotid vascular calcification. Skull: No fracture.  Fluid in the left mastoid Sinuses/Orbits: No acute finding. Other: None IMPRESSION: 1. No CT evidence for acute intracranial abnormality. 2. Atrophy and small vessel ischemic changes of the white matter Electronically Signed   By: Donavan Foil M.D.   On: 02/09/2018 00:36   Dg Chest Port 1 View  Result Date: 02/09/2018 CLINICAL DATA:  Altered mental status EXAM: PORTABLE CHEST 1 VIEW COMPARISON:  01/16/2018, 10/06/2016 FINDINGS: Cardiomegaly with vascular congestion. Patchy bibasilar atelectasis. No pleural effusion. No pneumothorax. IMPRESSION: Cardiomegaly with vascular congestion and bibasilar atelectasis Electronically Signed   By: Donavan Foil M.D.   On: 02/09/2018 00:33        Scheduled Meds: . amLODipine  10 mg Oral Daily  . aspirin EC  81 mg Oral Daily  . brimonidine  1 drop Both Eyes Q8H  . brinzolamide  1 drop Both Eyes TID  . carvedilol  6.25 mg Oral BID WC  . cholecalciferol  5,000 Units Oral BID  . cloNIDine  0.2 mg Transdermal Weekly  . feeding supplement (ENSURE ENLIVE)  237 mL Oral BID BM  . geriatric multivitamins-minerals  15 mL Oral Daily  .  heparin  5,000 Units Subcutaneous Q8H  . hydrALAZINE  100 mg Oral TID  . insulin aspart  0-5 Units Subcutaneous QHS  . insulin aspart  0-9 Units Subcutaneous TID WC  . latanoprost  1 drop Right Eye QHS  . mirabegron ER  50 mg Oral Daily  . OLANZapine  5 mg Oral QHS  . sodium chloride flush  3 mL Intravenous Q12H  . thiamine injection  100 mg Intravenous Daily   Continuous Infusions: . sodium chloride 75 mL/hr at 02/10/18 0554  . sodium chloride    . cefTRIAXone (ROCEPHIN)  IV Stopped (02/10/18 0244)     LOS: 1 day  Georgette Shell, MD [facility]  If 7PM-7AM, please contact night-coverage [email or web address] Password [facility] 02/10/2018, 11:05 AM

## 2018-02-10 NOTE — Consult Note (Signed)
[place] Nurse wound consult note Reason for Consult: Patient with bilateral Unna's Boots that are applied twice weekly by [facility] on Mondays and Thursdays.  We will continue this schedule. Orders placed for Ortho Tech to change twice weekly while in house. Nursing reports two partial thickness areas of moisture associated skin damage to the bilateral buttocks. Wound type: Venous insufficiency (LES) and moisture (Buttocks) Pressure Injury POA: NA Measurement:  Buttocks:  Two areas of symmetrical superficial skin loss measuring 1.5cm x 1cm x 0.1cm Wound MDY:JWLK, moist Drainage (amount, consistency, odor) scant serous Periwound:intact, dry Dressing procedure/placement/frequency: Paitent will be provided with a pressure redistribution chair pad that he may take with him upon discharge for use while OOB in chair.  He is to turn from side to side while in bed.  We will replace the Unna's boots on the previously established schedule of twice weekly.  The LEs are without edema, dressings are without seepage and Unna's Boots are appropriately applied today.  [place] nursing team will not follow, but will remain available to this patient, the nursing and medical teams.  Please re-consult if needed. Thanks, Maudie Flakes, MSN, RN, [place], [facility]  Pager# [phone]

## 2018-02-10 NOTE — Clinical Social Work Note (Signed)
Clinical Social Work Assessment  Patient Details  Name: Gregory Hatfield MRN: [id number] Date of Birth: 1926-01-27  Date of referral:  02/10/18               Reason for consult:  Facility Placement                Permission sought to share information with:  Facility Sport and exercise psychologist, Family Supports Permission granted to share information::  Yes, Verbal Permission Granted  Name::     Gregory Hatfield  Agency::  SNF  Relationship::  Daughter  Contact Information:  [phone]  Housing/Transportation Living arrangements for the past 2 months:  [place] of Information:  Adult Children Patient Interpreter Needed:  None Criminal Activity/Legal Involvement Pertinent to Current Situation/Hospitalization:  No - Comment as needed Significant Relationships:  Adult Children Lives with:  Self, Relatives, Other (Comment)(Caretakers) Do you feel safe going back to the place where you live?  No Need for family participation in patient care:  Yes (Comment)  Care giving concerns:  Patient currently lives at home with rotating caretakers/family members to provide almost 24/7 care. Patient's daughter expressed family's concern that patient needs more care than they can safely provide at home. She cited examples of him wandering and locking people out of the home.    Social Worker assessment / plan:  CSW spoke to patient's daughter/HCPOA, Gregory Hatfield, at bedside. Patient has dementia and was asleep. Gregory Hatfield expressed desire for patient to go to SNF for rehab and potentially transitioning into ALF after therapy.   Patient currently lives at home with almost 24/7 care. He has caretakers and family members who rotate to stay with him. Family has realized patient needs more care than they can provide at home. Family has met with [facility] and plans to meet with others to determine patient's ability to transition there after SNF.   Patient has large, supportive family who are currently  taking shifts at the hospital to be with him. Daughter, Gregory Hatfield, wants to be kept up to date through process. She lives in [place] and will be going back there on Monday.  Patient has previously been to [facility] and family would prefer for him to return there. Patient is currently receiving [facility] for PT and wound care.   CSW will send out referrals for SNF and complete FL2.  Employment status:  Retired Nurse, adult PT Recommendations:  [place] / Referral to community resources:  [place]  Patient/Family's Response to care:  Patient's family is grateful for CSW and SNF referrals. Daughter, Gregory Hatfield, expressed relief in having more time to figure out ALF situation for patient.   Patient/Family's Understanding of and Emotional Response to Diagnosis, Current Treatment, and Prognosis:  Patient's family understands SNF referral process. Daughter reports planning for the last few years since patient's dementia diagnosis and is ready to take the next steps in his care.   Emotional Assessment Appearance:  Appears stated age Attitude/Demeanor/Rapport:  Unable to Assess Affect (typically observed):  Unable to Assess Orientation:    Alcohol / Substance use:  Not Applicable Psych involvement (Current and /or in the community):  No (Comment)  Discharge Needs  Concerns to be addressed:  Care Coordination Readmission within the last 30 days:  No Current discharge risk:  Cognitively Impaired, Physical Impairment Barriers to Discharge:  Ship broker, Continued Medical Work up   [employer], [place] 02/10/2018, 4:19 PM

## 2018-02-10 NOTE — Evaluation (Signed)
Physical Therapy Evaluation Patient Details Name: Gregory Hatfield MRN: [id number] DOB: Jun 05, 1926 Today's Date: 02/10/2018   History of Present Illness  84 y.o. male with medical history significant of diastolic CHF, CKD stage IV, hypertension, DMII,  Admitted  for AMS, encephalopathy.  Clinical Impression  The patient was able to participate in mobility, assisted to ambulate to BR for BM  And assist to wash up. No family present to discuss DC plan. Patient will require 24/7 supervision for safety if Dc to home. Pt admitted with above diagnosis. Pt currently with functional limitations due to the deficits listed below (see PT Problem List).  Pt will benefit from skilled PT to increase their independence and safety with mobility to allow discharge to the venue listed below.       Follow Up Recommendations Home health PT(vs SNF- no family present)    Equipment Recommendations  None recommended by PT    Recommendations for Other Services       Precautions / Restrictions Precautions Precautions: Fall Precaution Comments: has been combative      Mobility  Bed Mobility Overal bed mobility: Needs Assistance Bed Mobility: Supine to Sit     Supine to sit: Min assist     General bed mobility comments: increased time to get up, patient reached  for footboard.   Transfers Overall transfer level: Needs assistance Equipment used: Rolling walker (2 wheeled) Transfers: Sit to/from Stand Sit to Stand: Min assist         General transfer comment: steady assist from bed and BSC over toilet, extra time to rise  Ambulation/Gait Ambulation/Gait assistance: Mod assist;+2 safety/equipment Gait Distance (Feet): 20 Feet(x 2) Assistive device: Rolling walker (2 wheeled) Gait Pattern/deviations: Step-to pattern;Staggering left;Staggering right     General Gait Details: stedt assist to guide the RW, patient faltered x 2 while ambulating, assist to stabilize required.  /Tends to run into   objects and doors.  Stairs            Wheelchair Mobility    Modified Rankin (Stroke Patients Only)       Balance Overall balance assessment: Needs assistance;History of Falls Sitting-balance support: No upper extremity supported;Feet supported Sitting balance-Leahy Scale: Fair     Standing balance support: Single extremity supported Standing balance-Leahy Scale: Poor Standing balance comment: requires steadying during standing, relies on RW for UE support                              Pertinent Vitals/Pain Pain Assessment: No/denies pain    Home Living Family/patient expects to be discharged to:: Private residence Living Arrangements: Other relatives(granddaughter) Available Help at Discharge: Family;Available 24 hours/day Type of Home: House Home Access: Stairs to enter Entrance Stairs-Rails: Can reach both Entrance Stairs-Number of Steps: 5 Home Layout: One level Home Equipment: Walker - 2 wheels;Cane - single point;Grab bars - tub/shower;Wheelchair - manual;Bedside commode Additional Comments: info from previous admit, no family present    Prior Function Level of Independence: Needs assistance   Gait / Transfers Assistance Needed: ambulates with AD   ADL's / Homemaking Assistance Needed: Granddaughter assists with cooking, cleaning, other ADLs         Hand Dominance        Extremity/Trunk Assessment   Upper Extremity Assessment Upper Extremity Assessment: Generalized weakness    Lower Extremity Assessment Lower Extremity Assessment: Generalized weakness    Cervical / Trunk Assessment Cervical / Trunk Assessment: Normal  Communication  Communication: HOH  Cognition Arousal/Alertness: Awake/alert Behavior During Therapy: WFL for tasks assessed/performed Overall Cognitive Status: No family/caregiver present to determine baseline cognitive functioning Area of Impairment: Orientation                 Orientation Level:  Place;Time;Situation             General Comments: patient was cogniznat of needing to go to BR, concerned about hygiene although had  been incontinent of PM      General Comments      Exercises     Assessment/Plan    PT Assessment Patient needs continued PT services  PT Problem List         PT Treatment Interventions DME instruction;Therapeutic activities;Gait training;Stair training;Functional mobility training;Patient/family education;Balance training    PT Goals (Current goals can be found in the Care Plan section)  Acute Rehab PT Goals PT Goal Formulation: Patient unable to participate in goal setting Time For Goal Achievement: 02/24/18 Potential to Achieve Goals: Good    Frequency Min 2X/week   Barriers to discharge        Co-evaluation               AM-PAC PT "6 Clicks" Daily Activity  Outcome Measure Difficulty turning over in bed (including adjusting bedclothes, sheets and blankets)?: A Lot Difficulty moving from lying on back to sitting on the side of the bed? : A Lot Difficulty sitting down on and standing up from a chair with arms (e.g., wheelchair, bedside commode, etc,.)?: Unable Help needed moving to and from a bed to chair (including a wheelchair)?: Total Help needed walking in hospital room?: Total Help needed climbing 3-5 steps with a railing? : Total 6 Click Score: 8    End of Session Equipment Utilized During Treatment: Gait belt Activity Tolerance: Patient tolerated treatment well;Patient limited by fatigue Patient left: in chair;with call bell/phone within reach;with chair alarm set Nurse Communication: Mobility status PT Visit Diagnosis: Unsteadiness on feet (R26.81);Other abnormalities of gait and mobility (R26.89)    Time: 1206-1230 PT Time Calculation (min) (ACUTE ONLY): 24 min   Charges:   PT Evaluation $PT Eval Low Complexity: 1 Low PT Treatments $Gait Training: 8-22 mins        [place] PT [phone]   Claretha Cooper 02/10/2018, 1:03 PM

## 2018-02-10 NOTE — NC FL2 (Signed)
[place] LEVEL OF CARE SCREENING TOOL     IDENTIFICATION  Patient Name: Gregory Hatfield Birthdate: 06-04-1926 Sex: male Admission Date (Current Location): 02/08/2018  [facility] and [place] Number:  Herbalist and Address:  [facility],  [place], [place]      Provider Number: [id number]  Attending Physician Name and Address:  Georgette Shell, MD  Relative Name and Phone Number:  Nimai Burbach: [phone]    Current Level of Care: Hospital Recommended Level of Care: [place] Prior Approval Number:    Date Approved/Denied:   PASRR Number: [id number]  Discharge Plan: SNF    Current Diagnoses: Patient Active Problem List   Diagnosis Date Noted  . Acute lower UTI 02/09/2018  . Acute encephalopathy 02/09/2018  . UTI (urinary tract infection) 02/09/2018  . Metabolic encephalopathy 32/20/2542  . Pressure injury of skin 02/09/2018  . Peripheral edema   . Chronic diastolic CHF (congestive heart failure) ([place]) 01/16/2018  . Symptomatic bradycardia 10/06/2016  . Diabetes mellitus with complication ([place]) 70/62/3762  . Acute on chronic diastolic heart failure ([place]) 10/06/2016  . Hyperlipidemia 01/12/2016  . Prostate cancer ([place]) 10/12/2015  . Diabetic retinopathy ([place]) 10/12/2015  . Encounter for Medicare annual wellness exam 10/12/2015  . Open-angle glaucoma 07/06/2015  . CKD stage 4 due to type 2 diabetes mellitus ([place]) 06/03/2015  . Vitamin D deficiency 06/03/2015  . Medication management 06/03/2015  . Insulin-requiring or dependent type II diabetes mellitus ([place]) 05/05/2015  . Essential hypertension 05/05/2015  . SDAT (senile dementia of Alzheimer's type) 06/16/2013    Orientation RESPIRATION BLADDER Height & Weight        Normal External catheter Weight: 206 lb 9.1 oz (93.7 kg) Height:  5\' 11"  (180.3 cm)  BEHAVIORAL SYMPTOMS/MOOD NEUROLOGICAL BOWEL NUTRITION STATUS      Continent  Diet(Carb modified)  AMBULATORY STATUS COMMUNICATION OF NEEDS Skin   Extensive Assist Verbally Other (Comment)(Pressure injury left buttock)                       Personal Care Assistance Level of Assistance  Bathing, Feeding, Dressing Bathing Assistance: Maximum assistance Feeding assistance: Limited assistance Dressing Assistance: Maximum assistance     Functional Limitations Info  Sight, Hearing, Speech Sight Info: Adequate Hearing Info: Adequate Speech Info: Adequate    SPECIAL CARE FACTORS FREQUENCY  PT (By licensed PT), OT (By licensed OT)     PT Frequency: 5x/week OT Frequency: 5x/week            Contractures Contractures Info: Not present    Additional Factors Info  Code Status, Allergies Code Status Info: Full Allergies Info: ACE INHIBITORS, DEMEROL, COSOPT DORZOLAMIDE HCL-TIMOLOL MAL, FLOMAX TAMSULOSIN HCL, MORPHINE AND RELATED, OXYTROL OXYBUTYNIN            Current Medications (02/10/2018):  This is the current hospital active medication list Current Facility-Administered Medications  Medication Dose Route Frequency Provider Last Rate Last Dose  . 0.45 % sodium chloride infusion   Intravenous Continuous Tawni Millers, MD 75 mL/hr at 02/10/18 1154    . 0.9 %  sodium chloride infusion  250 mL Intravenous PRN Opyd, Ilene Qua, MD      . acetaminophen (TYLENOL) tablet 650 mg  650 mg Oral Q6H PRN Opyd, Ilene Qua, MD       Or  . acetaminophen (TYLENOL) suppository 650 mg  650 mg Rectal Q6H PRN Opyd, Ilene Qua, MD      .  amLODipine (NORVASC) tablet 10 mg  10 mg Oral Daily Opyd, Ilene Qua, MD   10 mg at 02/10/18 1132  . aspirin EC tablet 81 mg  81 mg Oral Daily Opyd, Ilene Qua, MD   81 mg at 02/10/18 1131  . brimonidine (ALPHAGAN) 0.15 % ophthalmic solution 1 drop  1 drop Both Eyes Q8H Opyd, Ilene Qua, MD   1 drop at 02/10/18 1433  . brinzolamide (AZOPT) 1 % ophthalmic suspension 1 drop  1 drop Both Eyes TID Opyd, Ilene Qua, MD   1 drop at 02/10/18  1147  . carvedilol (COREG) tablet 6.25 mg  6.25 mg Oral BID WC Opyd, Ilene Qua, MD   6.25 mg at 02/10/18 4270  . cefTRIAXone (ROCEPHIN) 1 g in sodium chloride 0.9 % 100 mL IVPB  1 g Intravenous Q24H Opyd, Ilene Qua, MD   Stopped at 02/10/18 0244  . cholecalciferol (VITAMIN D) tablet 5,000 Units  5,000 Units Oral BID Vianne Bulls, MD   5,000 Units at 02/10/18 1134  . cloNIDine (CATAPRES - Dosed in mg/24 hr) patch 0.2 mg  0.2 mg Transdermal Weekly Arrien, Jimmy Picket, MD   0.2 mg at 02/09/18 1000  . feeding supplement (ENSURE ENLIVE) (ENSURE ENLIVE) liquid 237 mL  237 mL Oral BID BM Arrien, Jimmy Picket, MD   237 mL at 02/10/18 1156  . geriatric multivitamins-minerals (ELDERTONIC/GEVRABON) liquid 15 mL  15 mL Oral Daily Arrien, Jimmy Picket, MD   15 mL at 02/10/18 1135  . haloperidol (HALDOL) tablet 2 mg  2 mg Oral Q4H PRN Arrien, Jimmy Picket, MD       Or  . haloperidol lactate (HALDOL) injection 2 mg  2 mg Intramuscular Q4H PRN Arrien, Jimmy Picket, MD   2 mg at 02/09/18 1809  . heparin injection 5,000 Units  5,000 Units Subcutaneous Q8H Opyd, Ilene Qua, MD   5,000 Units at 02/10/18 1342  . hydrALAZINE (APRESOLINE) tablet 100 mg  100 mg Oral TID Vianne Bulls, MD   100 mg at 02/10/18 1132  . insulin aspart (novoLOG) injection 0-5 Units  0-5 Units Subcutaneous QHS Opyd, Timothy S, MD      . insulin aspart (novoLOG) injection 0-9 Units  0-9 Units Subcutaneous TID WC Opyd, Ilene Qua, MD   2 Units at 02/10/18 1342  . insulin detemir (LEVEMIR) injection 5 Units  5 Units Subcutaneous BID Georgette Shell, MD   5 Units at 02/10/18 1342  . latanoprost (XALATAN) 0.005 % ophthalmic solution 1 drop  1 drop Right Eye QHS Opyd, Ilene Qua, MD   1 drop at 02/09/18 2207  . mirabegron ER (MYRBETRIQ) tablet 50 mg  50 mg Oral Daily Opyd, Ilene Qua, MD   50 mg at 02/10/18 1134  . OLANZapine (ZYPREXA) tablet 5 mg  5 mg Oral QHS Arrien, Jimmy Picket, MD   5 mg at 02/09/18 2205  . ondansetron  (ZOFRAN) tablet 4 mg  4 mg Oral Q6H PRN Opyd, Ilene Qua, MD       Or  . ondansetron (ZOFRAN) injection 4 mg  4 mg Intravenous Q6H PRN Opyd, Ilene Qua, MD      . senna-docusate (Senokot-S) tablet 1 tablet  1 tablet Oral QHS PRN Opyd, Ilene Qua, MD      . sodium chloride flush (NS) 0.9 % injection 3 mL  3 mL Intravenous Q12H Opyd, Timothy S, MD      . sodium chloride flush (NS) 0.9 % injection 3 mL  3 mL Intravenous  PRN Vianne Bulls, MD      . thiamine (B-1) injection 100 mg  100 mg Intravenous Daily Arrien, Jimmy Picket, MD   100 mg at 02/10/18 1132     Discharge Medications: Please see discharge summary for a list of discharge medications.  Relevant Imaging Results:  Relevant Lab Results:   Additional Information SSN: [id number]  Pricilla Holm, LCSW

## 2018-02-11 LAB — URINE CULTURE: Culture: >=100000 — AB

## 2018-02-11 LAB — GLUCOSE, CAPILLARY
GLUCOSE-CAPILLARY: 216 mg/dL — AB (ref 70–99)
Glucose-Capillary: 142 mg/dL — ABNORMAL HIGH (ref 70–99)
Glucose-Capillary: 194 mg/dL — ABNORMAL HIGH (ref 70–99)
Glucose-Capillary: 263 mg/dL — ABNORMAL HIGH (ref 70–99)

## 2018-02-11 LAB — BASIC METABOLIC PANEL
Anion gap: 7 (ref 5–15)
BUN: 25 mg/dL — AB (ref 8–23)
CHLORIDE: 113 mmol/L — AB (ref 98–111)
CO2: 23 mmol/L (ref 22–32)
CREATININE: 1.5 mg/dL — AB (ref 0.61–1.24)
Calcium: 8.6 mg/dL — ABNORMAL LOW (ref 8.9–10.3)
GFR calc Af Amer: 45 mL/min — ABNORMAL LOW (ref >60)
GFR calc non Af Amer: 39 mL/min — ABNORMAL LOW (ref >60)
Glucose, Bld: 157 mg/dL — ABNORMAL HIGH (ref 70–99)
Potassium: 3.4 mmol/L — ABNORMAL LOW (ref 3.5–5.1)
Sodium: 143 mmol/L (ref 135–145)

## 2018-02-11 MED ORDER — CEPHALEXIN 500 MG PO CAPS: 500.0000 mg | ORAL_CAPSULE | Freq: Two times a day (BID) | ORAL | Status: DC

## 2018-02-11 MED ORDER — AMOXICILLIN 500 MG PO CAPS
500.0000 mg | ORAL_CAPSULE | Freq: Three times a day (TID) | ORAL | Status: DC
  Administered 2018-02-12 (×3): 500 mg via ORAL
  Filled 2018-02-11 (×3): qty 1

## 2018-02-11 MED ORDER — VITAMIN B-1 100 MG PO TABS
100.0000 mg | ORAL_TABLET | Freq: Every day | ORAL | Status: DC
  Administered 2018-02-11 – 2018-02-12 (×2): 100 mg via ORAL
  Filled 2018-02-11 (×2): qty 1

## 2018-02-11 MED ORDER — SODIUM CHLORIDE 0.9 % IV SOLN: INTRAVENOUS | Status: DC | PRN

## 2018-02-11 NOTE — Plan of Care (Signed)
Pt stable this am with no needs. No changes in pt overall condition. Will continue to monitor.

## 2018-02-11 NOTE — Progress Notes (Signed)
PROGRESS NOTE    Gregory Hatfield  ZOX:[id number] DOB: [birth year] DOA: 02/08/2018 PCP: [name], MD Brief Narrative67 year old male who was admitted due to confusion, agitation and malodorous urine. He does have significant past medical history for Alzheimer's dementia, chronic diastolic heart failure, type 2 diabetes mellitus, chronic kidney disease stage IV, and hypertension. Over the last 2 to 3 days prior to hospitalization he was noted to have worsening confusion, agitation, decreased appetite and significant decrease in his functional physical capacity. His urine had a full odor.On his initial physical examination patient was agitated, combative, received Haldol and Versed in the ED.His initialvital signs include a heart rate of 50 bpm, bloodpressure 162/80, oxygen saturation 100%, he had moist mucous membranes, lungs clear to auscultation bilaterally, heart S1-S2 present rhythmic, abdomen soft nontender, nolower extremity edema, patient was agitated and combative.Na 144, potassium 3.8, chloride 108, bicarb 26, glucose 251, BUN 38, creatinine 2.1,WBC5.1, hemoglobin 12.1, hematocrit 37.2, platelets 217.His urine analysis had greater than 60 white cells, specific gravity 1.017, 1+ protein. HeadCT with atrophy, no acute changes. Chest x-ray with right rotation, hypoinflation, bibasilar atelectasis.  Patient was admitted to the hospital with working diagnosis of acute metabolic encephalopathy due to urinary tract infection, present on admission  Assessment & Plan:   Principal Problem:   Acute lower UTI Active Problems:   SDAT (senile dementia of Alzheimer's type)   Insulin-requiring or dependent type II diabetes mellitus ([place])   Essential hypertension   CKD stage 4 due to type 2 diabetes mellitus ([place])   Chronic diastolic CHF (congestive heart failure) ([place])   Acute encephalopathy   UTI (urinary tract infection)   Metabolic encephalopathy   Pressure injury of  skin 1. Metabolic encephalopathy in the setting of urinary tract infection. Patient continue to be agitated and occasionally combative, will increase dose of haldol to 2 mg as needed every 4 hours, will avoid benzodiazepines.urine culture pansensitive enterococcus.will start ampicillin.dw daughter in detail  2. T2DM-restart detemir insulin 5 units twice a day.    3. Diastolic heart failure, chronic and stable with no signs of exacerbation. Dc ivf.he has been on fluids for over 48 hours.  4. HTN. Patient on amlodipine, carvedilol, hydralazine and clonidine. Has been refusing po meds, will change clonidine to transdermal patch to avoid rebound hypertension.   5. AKI on Stage IV CKD with hypernatremia. Improved.Will continue gentle hydration, avoid hypotension and nephrotoxic medications, hold on furosemide.  Sodium down to 143. Serum creatinine down to 1.50 with a K of 3.4.ivf dc today      DVT prophylaxis heparin Code Status:full Family Communication: dw daughter  Disposition Plan: Plan dc to snf Consultants:  none  Procedures:none Antimicrobials ampicillin Subjective: pleasantly confused in nad staff reports he is refusing medications  Objective: Vitals:   02/10/18 1454 02/10/18 2047 02/11/18 0500 02/11/18 0536  BP: (!) 155/66 (!) 158/88  (!) 173/66  Pulse: (!) 59 (!) 58  (!) 55  Resp: 17 16  18   Temp: 98.6 F (37 C) 98.9 F (37.2 C)  98.7 F (37.1 C)  TempSrc: Oral Oral  Oral  SpO2: 96% 96%  95%  Weight:   95.8 kg   Height:        Intake/Output Summary (Last 24 hours) at 02/11/2018 1246 Last data filed at 02/11/2018 1100 Gross per 24 hour  Intake 2588.76 ml  Output 1726 ml  Net 862.76 ml   Filed Weights   02/09/18 0033 02/10/18 0516 02/11/18 0500  Weight: 93.9 kg 93.7 kg 95.8  kg    Examination:  General exam: Appears calm and comfortable  Respiratory system: Clear to auscultation. Respiratory effort normal. Cardiovascular system: S1 & S2 heard, RRR. No  JVD, murmurs, rubs, gallops or clicks. No pedal edema. Gastrointestinal system: Abdomen is nondistended, soft and nontender. No organomegaly or masses felt. Normal bowel sounds heard Extremities: Symmetric 5 x 5 power. Skin: No rashes, lesions or ulcers    Data Reviewed: I have personally reviewed following labs and imaging studies  CBC: Recent Labs  Lab 02/08/18 0918 02/09/18 0016 02/10/18 0507  WBC 5.1 5.5 6.9  NEUTROABS 2,586 2.6 3.7  HGB 12.1* 12.1* 12.1*  HCT 37.2* 38.2* 39.0  MCV 89.6 92.0 93.5  PLT 217 218 106   Basic Metabolic Panel: Recent Labs  Lab 02/08/18 0918 02/09/18 0016 02/10/18 0507 02/11/18 0445  NA 144 149* 144 143  K 3.8 4.5 3.0* 3.4*  CL 108 112* 113* 113*  CO2 26 26 23 23   GLUCOSE 251* 88 146* 157*  BUN 38* 38* 26* 25*  CREATININE 2.14* 2.21* 1.57* 1.50*  CALCIUM 9.2 9.4 8.5* 8.6*   GFR: Estimated Creatinine Clearance: 38.7 mL/min (A) (by C-G formula based on SCr of 1.5 mg/dL (H)). Liver Function Tests: Recent Labs  Lab 02/08/18 0918 02/09/18 0016  AST 19 44*  ALT 17 19  ALKPHOS  --  55  BILITOT 0.4 1.2  PROT 6.7 7.1  ALBUMIN  --  4.0   No results for input(s): LIPASE, AMYLASE in the last 168 hours. No results for input(s): AMMONIA in the last 168 hours. Coagulation Profile: No results for input(s): INR, PROTIME in the last 168 hours. Cardiac Enzymes: Recent Labs  Lab 02/09/18 0016  TROPONINI 0.03*   BNP (last 3 results) No results for input(s): PROBNP in the last 8760 hours. HbA1C: No results for input(s): HGBA1C in the last 72 hours. CBG: Recent Labs  Lab 02/10/18 1217 02/10/18 1646 02/10/18 2116 02/11/18 0758 02/11/18 1214  GLUCAP 183* 222* 177* 142* 194*   Lipid Profile: No results for input(s): CHOL, HDL, LDLCALC, TRIG, CHOLHDL, LDLDIRECT in the last 72 hours. Thyroid Function Tests: No results for input(s): TSH, T4TOTAL, FREET4, T3FREE, THYROIDAB in the last 72 hours. Anemia Panel: No results for input(s):  VITAMINB12, FOLATE, FERRITIN, TIBC, IRON, RETICCTPCT in the last 72 hours. Sepsis Labs: No results for input(s): PROCALCITON, LATICACIDVEN in the last 168 hours.  Recent Results (from the past 240 hour(s))  Urine Culture     Status: Abnormal (Preliminary result)   Collection Time: 02/08/18  9:18 AM  Result Value Ref Range Status   MICRO NUMBER: [id number]  Preliminary   SPECIMEN QUALITY: ADEQUATE  Preliminary   Sample Source URINE  Preliminary   STATUS: PRELIMINARY  Preliminary   ISOLATE 1: Enterococcus faecalis (A)  Preliminary    Comment: Greater than 100,000 CFU/mL of Enterococcus faecalis , susceptibility Gregory Hatfield report to follow.  Blood culture (routine x 2)     Status: None (Preliminary result)   Collection Time: 02/09/18 12:16 AM  Result Value Ref Range Status   Specimen Description   Final    BLOOD LEFT FOREARM Performed at [facility], [place] [address] [place], [place] [address]    Special Requests   Final    BOTTLES DRAWN AEROBIC AND ANAEROBIC Blood Culture adequate volume Performed at [facility] [address], [place], [place] [address]    Culture   Final    NO GROWTH 1 DAY Performed at [facility], [place] [address], [place], [place] [address]    Report Status PENDING  Incomplete  Urine culture     Status: Abnormal   Collection Time: 02/09/18  2:12 AM  Result Value Ref Range Status   Specimen Description   Final    URINE, CLEAN CATCH Performed at [facility], [place] [address], [place], [place] [address]    Special Requests   Final    NONE Performed at [facility], [place] [address], [place], [place] [address]    Culture >=100,000 COLONIES/mL ENTEROCOCCUS FAECALIS (A)  Final   Report Status 02/11/2018 FINAL  Final   Organism ID, Bacteria ENTEROCOCCUS FAECALIS (A)  Final      Susceptibility   Enterococcus faecalis - MIC*    AMPICILLIN <=2 SENSITIVE Sensitive     LEVOFLOXACIN 1 SENSITIVE Sensitive       NITROFURANTOIN <=16 SENSITIVE Sensitive     VANCOMYCIN 1 SENSITIVE Sensitive     * >=100,000 COLONIES/mL ENTEROCOCCUS FAECALIS         Radiology Studies: No results found.      Scheduled Meds: . amLODipine  10 mg Oral Daily  . aspirin EC  81 mg Oral Daily  . brimonidine  1 drop Both Eyes Q8H  . brinzolamide  1 drop Both Eyes TID  . carvedilol  6.25 mg Oral BID WC  . cholecalciferol  5,000 Units Oral BID  . cloNIDine  0.2 mg Transdermal Weekly  . feeding supplement (ENSURE ENLIVE)  237 mL Oral BID BM  . geriatric multivitamins-minerals  15 mL Oral Daily  . heparin  5,000 Units Subcutaneous Q8H  . hydrALAZINE  100 mg Oral TID  . insulin aspart  0-5 Units Subcutaneous QHS  . insulin aspart  0-9 Units Subcutaneous TID WC  . insulin detemir  5 Units Subcutaneous BID  . latanoprost  1 drop Right Eye QHS  . mirabegron ER  50 mg Oral Daily  . OLANZapine  5 mg Oral QHS  . sodium chloride flush  3 mL Intravenous Q12H  . thiamine  100 mg Oral Daily   Continuous Infusions: . sodium chloride    . sodium chloride    . cefTRIAXone (ROCEPHIN)  IV Stopped (02/11/18 0236)     LOS: 2 days     Georgette Shell, MD [facility] If 7PM-7AM, please contact night-coverage [email or web address] Password [facility] 02/11/2018, 12:46 PM

## 2018-02-12 ENCOUNTER — Inpatient Hospital Stay (HOSPITAL_COMMUNITY): Payer: Medicare Other

## 2018-02-12 LAB — GLUCOSE, CAPILLARY
GLUCOSE-CAPILLARY: 155 mg/dL — AB (ref 70–99)
GLUCOSE-CAPILLARY: 210 mg/dL — AB (ref 70–99)
GLUCOSE-CAPILLARY: 304 mg/dL — AB (ref 70–99)
GLUCOSE-CAPILLARY: 477 mg/dL — AB (ref 70–99)
Glucose-Capillary: 137 mg/dL — ABNORMAL HIGH (ref 70–99)

## 2018-02-12 LAB — URINALYSIS W MICROSCOPIC + REFLEX CULTURE
Bilirubin Urine: NEGATIVE
Glucose, UA: NEGATIVE
HGB URINE DIPSTICK: NEGATIVE
Hyaline Cast: NONE SEEN /LPF
KETONES UR: NEGATIVE
Nitrites, Initial: NEGATIVE
PH: 5.5 (ref 5.0–8.0)
SQUAMOUS EPITHELIAL / LPF: NONE SEEN /HPF (ref <=5)
Specific Gravity, Urine: 1.017 (ref 1.001–1.03)

## 2018-02-12 LAB — CBC WITH DIFFERENTIAL/PLATELET
Basophils Absolute: 51 cells/uL (ref 0–200)
Basophils Relative: 1 %
EOS ABS: 133 {cells}/uL (ref 15–500)
Eosinophils Relative: 2.6 %
HCT: 37.2 % — ABNORMAL LOW (ref 38.5–50.0)
HEMOGLOBIN: 12.1 g/dL — AB (ref 13.2–17.1)
Lymphs Abs: 1785 cells/uL (ref 850–3900)
MCH: 29.2 pg (ref 27.0–33.0)
MCHC: 32.5 g/dL (ref 32.0–36.0)
MCV: 89.6 fL (ref 80.0–100.0)
MONOS PCT: 10.7 %
MPV: 12.7 fL — ABNORMAL HIGH (ref 7.5–12.5)
NEUTROS ABS: 2586 {cells}/uL (ref 1500–7800)
Neutrophils Relative %: 50.7 %
Platelets: 217 10*3/uL (ref 140–400)
RBC: 4.15 10*6/uL — ABNORMAL LOW (ref 4.20–5.80)
RDW: 12.7 % (ref 11.0–15.0)
Total Lymphocyte: 35 %
WBC: 5.1 10*3/uL (ref 3.8–10.8)
WBCMIX: 546 {cells}/uL (ref 200–950)

## 2018-02-12 LAB — BASIC METABOLIC PANEL
ANION GAP: 10 (ref 5–15)
BUN: 27 mg/dL — ABNORMAL HIGH (ref 8–23)
CALCIUM: 8.7 mg/dL — AB (ref 8.9–10.3)
CO2: 22 mmol/L (ref 22–32)
Chloride: 109 mmol/L (ref 98–111)
Creatinine, Ser: 1.73 mg/dL — ABNORMAL HIGH (ref 0.61–1.24)
GFR, EST AFRICAN AMERICAN: 38 mL/min — AB (ref >60)
GFR, EST NON AFRICAN AMERICAN: 33 mL/min — AB (ref >60)
Glucose, Bld: 195 mg/dL — ABNORMAL HIGH (ref 70–99)
POTASSIUM: 3.6 mmol/L (ref 3.5–5.1)
Sodium: 141 mmol/L (ref 135–145)

## 2018-02-12 LAB — COMPLETE METABOLIC PANEL WITH GFR
AG Ratio: 1.5 (calc) (ref 1.0–2.5)
ALT: 17 U/L (ref 9–46)
AST: 19 U/L (ref 10–35)
Albumin: 4 g/dL (ref 3.6–5.1)
Alkaline phosphatase (APISO): 64 U/L (ref 40–115)
BUN/Creatinine Ratio: 18 (calc) (ref 6–22)
BUN: 38 mg/dL — AB (ref 7–25)
CALCIUM: 9.2 mg/dL (ref 8.6–10.3)
CO2: 26 mmol/L (ref 20–32)
CREATININE: 2.14 mg/dL — AB (ref 0.70–1.11)
Chloride: 108 mmol/L (ref 98–110)
GFR, EST AFRICAN AMERICAN: 30 mL/min/{1.73_m2} — AB (ref >=60)
GFR, EST NON AFRICAN AMERICAN: 26 mL/min/{1.73_m2} — AB (ref >=60)
GLUCOSE: 251 mg/dL — AB (ref 65–99)
Globulin: 2.7 g/dL (calc) (ref 1.9–3.7)
Potassium: 3.8 mmol/L (ref 3.5–5.3)
Sodium: 144 mmol/L (ref 135–146)
TOTAL PROTEIN: 6.7 g/dL (ref 6.1–8.1)
Total Bilirubin: 0.4 mg/dL (ref 0.2–1.2)

## 2018-02-12 LAB — URINE CULTURE
MICRO NUMBER: 91008932
SPECIMEN QUALITY: ADEQUATE

## 2018-02-12 LAB — CULTURE INDICATED

## 2018-02-12 MED ORDER — FUROSEMIDE 20 MG PO TABS: 20.0000 mg | ORAL_TABLET | Freq: Every day | ORAL | 11 refills | Status: DC

## 2018-02-12 MED ORDER — CLONIDINE 0.2 MG/24HR TD PTWK: 0.2000 mg | MEDICATED_PATCH | TRANSDERMAL | 12 refills | Status: DC

## 2018-02-12 MED ORDER — INSULIN DETEMIR 100 UNIT/ML ~~LOC~~ SOLN: 10.0000 [IU] | Freq: Two times a day (BID) | SUBCUTANEOUS | 11 refills | Status: DC

## 2018-02-12 MED ORDER — ENSURE ENLIVE PO LIQD: 237.0000 mL | Freq: Two times a day (BID) | ORAL | 12 refills | Status: DC

## 2018-02-12 MED ORDER — ELDERTONIC PO LIQD: 15.0000 mL | Freq: Every day | ORAL | Status: DC

## 2018-02-12 MED ORDER — OLANZAPINE 5 MG PO TABS: 5.0000 mg | ORAL_TABLET | Freq: Every day | ORAL | 0 refills | Status: DC

## 2018-02-12 MED ORDER — ONDANSETRON HCL 4 MG PO TABS: 4.0000 mg | ORAL_TABLET | Freq: Every day | ORAL | 1 refills | Status: AC | PRN

## 2018-02-12 MED ORDER — INSULIN ASPART 100 UNIT/ML ~~LOC~~ SOLN
10.0000 [IU] | Freq: Once | SUBCUTANEOUS | Status: AC
  Administered 2018-02-12: 10 [IU] via SUBCUTANEOUS

## 2018-02-12 MED ORDER — HALOPERIDOL 2 MG PO TABS: 2.0000 mg | ORAL_TABLET | ORAL | 0 refills | Status: DC | PRN

## 2018-02-12 MED ORDER — AMOXICILLIN 500 MG PO CAPS: 500.0000 mg | ORAL_CAPSULE | Freq: Three times a day (TID) | ORAL | 0 refills | Status: DC

## 2018-02-12 NOTE — Progress Notes (Signed)
Ship broker received. Patient can discharge once medically stable.   Kathrin Greathouse, Marlinda Mike, MSW Clinical Social Worker  [phone] 02/12/2018  3:04 PM

## 2018-02-12 NOTE — Clinical Social Work Placement (Addendum)
   CLINICAL SOCIAL WORK PLACEMENT  NOTE  Date:  02/12/2018  Patient Details  Name: Gregory Hatfield MRN: [id number] Date of Birth: 10-29-1925  Clinical Social Work is seeking post-discharge placement for this patient at the [place] level of care (*CSW will initial, date and re-position this form in  chart as items are completed):  Yes   Patient/family provided with [place] Work Department's list of facilities offering this level of care within the geographic area requested by the patient (or if unable, by the patient's family).  Yes   Patient/family informed of their freedom to choose among providers that offer the needed level of care, that participate in Medicare, Medicaid or managed care program needed by the patient, have an available bed and are willing to accept the patient.  Yes   Patient/family informed of [place]'s ownership interest in [facility] and [facility], as well as of the fact that they are under no obligation to receive care at these facilities.  PASRR submitted to EDS on       PASRR number received on       Existing PASRR number confirmed on 02/10/18     FL2 transmitted to all facilities in geographic area requested by pt/family on 02/10/18     FL2 transmitted to all facilities within larger geographic area on 02/10/18     Patient informed that his/her managed care company has contracts with or will negotiate with certain facilities, including the following:        Yes   Patient/family informed of bed offers received.  Patient [facility] bed [facility]     Physician recommends and patient chooses bed at      Patient to be transferred to [facility] on  .02/12/2018  Patient to be transferred to facility by [facility]      Patient family notified on   of transfer. 02/12/2018  Name of family member notified:  Daughter: Velva Harman.      PHYSICIAN       Additional Comment:     _______________________________________________ Lia Hopping, LCSW 02/12/2018, 10:45 AM

## 2018-02-12 NOTE — Progress Notes (Signed)
[facility] was called to check on transport for patient, they said they're on their way.

## 2018-02-12 NOTE — Care Management Important Message (Signed)
Important Message  Patient Details  Name: Gregory Hatfield MRN: [id number] Date of Birth: September 29, 1925   Medicare Important Message Given:  Yes    Kerin Salen 02/12/2018, 10:24 AMImportant Message  Patient Details  Name: Gregory Hatfield MRN: [id number] Date of Birth: 04/28/1926   Medicare Important Message Given:  Yes    Kerin Salen 02/12/2018, 10:24 AM

## 2018-02-12 NOTE — Progress Notes (Signed)
Call made to daughter Velva Harman to notify her of the fracture of right upper extremity. Informed her that discharge is cancelled. Also, call made to SW Clarinda to inform her. Donne Hazel, RN

## 2018-02-12 NOTE — Discharge Summary (Signed)
Physician Discharge Summary  Gregory Hatfield Fake GYJ:[id number] DOB: [birth year] DOA: 02/08/2018  PCP: [name], MD  Admit date: 02/08/2018 Discharge date: 02/12/2018  Admitted From: home Disposition:  snf  Recommendations for Outpatient Follow-up:  1. Follow up with PCP in 1-2 weeks 2. Please obtain BMP/CBC in one week  Home Health:none Equipment/Devices none Discharge Condition stable CODE STATUS:full Diet recommendation:cardiac Brief/Interim Summary:84 year old male who was admitted due to confusion, agitation and malodorous urine. He does have significant past medical history for Alzheimer's dementia, chronic diastolic heart failure, type 2 diabetes mellitus, chronic kidney disease stage IV, and hypertension. Over the last 2 to 3 days prior to hospitalization he was noted to have worsening confusion, agitation, decreased appetite and significant decrease in his functional physical capacity. His urine had a full odor.On his initial physical examination patient was agitated, combative, received Haldol and Versed in the ED.His initialvital signs include a heart rate of 50 bpm, bloodpressure 162/80, oxygen saturation 100%, he had moist mucous membranes, lungs clear to auscultation bilaterally, heart S1-S2 present rhythmic, abdomen soft nontender, nolower extremity edema, patient was agitated and combative.Na 144, potassium 3.8, chloride 108, bicarb 26, glucose 251, BUN 38, creatinine 2.1,WBC5.1, hemoglobin 12.1, hematocrit 37.2, platelets 217.His urine analysis had greater than 60 white cells, specific gravity 1.017, 1+ protein. HeadCT with atrophy, no acute changes. Chest x-ray with right rotation, hypoinflation, bibasilar atelectasis.  Patient was admitted to the hospital with working diagnosis of acute metabolic encephalopathy due to urinary tract infection, present on admission   Discharge Diagnoses:  Principal Problem:   Acute lower UTI Active Problems:   SDAT  (senile dementia of Alzheimer's type)   Insulin-requiring or dependent type II diabetes mellitus ([place])   Essential hypertension   CKD stage 4 due to type 2 diabetes mellitus ([place])   Chronic diastolic CHF (congestive heart failure) ([place])   Acute encephalopathy   UTI (urinary tract infection)   Metabolic encephalopathy   Pressure injury of skin  1. Metabolic encephalopathy in the setting of urinary tract infection. Patient was agitated and occasionally combative, which was treated with Haldol and Zyprexa.  Will increase dose of haldol to 2 mg as needed every 4 hours, will avoid benzodiazepines.urine culture pansensitive enterococcus.will start ampicillin.dw daughter in detail  2. T2DM-restart detemir insulin 10 units twice a day.  3. Diastolic heart failure, chronic and stable with no signs of exacerbation.  Restart Lasix at a lower dose of 20 mg daily.  Patient was on 60 mg at home.  The reason the dose is decreased to 20 mg is due to decreased p.o. intake and dehydration.  Consider increasing the dose depending on the clinical status of the patient.  4. HTN. Patient on amlodipine, carvedilol, hydralazine and clonidine.  5. AKI on Stage IV CKD with hypernatremia.resolved.   Discharge Instructions  Discharge Instructions    Call MD for:  difficulty breathing, headache or visual disturbances   Complete by:  As directed    Call MD for:  persistant dizziness or light-headedness   Complete by:  As directed    Call MD for:  persistant nausea and vomiting   Complete by:  As directed    Call MD for:  severe uncontrolled pain   Complete by:  As directed    Diet - low sodium heart healthy   Complete by:  As directed    Increase activity slowly   Complete by:  As directed      Allergies as of 02/12/2018  Reactions   Ace Inhibitors Other (See Comments), Cough   Chronic cough   Demerol Other (See Comments)   Hard to wake up   Cosopt [dorzolamide Hcl-timolol Mal] Other (See  Comments)   Cause heart rate to drop   Flomax [tamsulosin Hcl] Other (See Comments)   Low BP   Morphine And Related Other (See Comments)   hallucinations   Oxytrol [oxybutynin] Rash      Medication List    STOP taking these medications   cloNIDine 0.1 MG tablet Commonly known as:  CATAPRES   potassium chloride SA 20 MEQ tablet Commonly known as:  K-DUR,KLOR-CON     TAKE these medications   amLODipine 10 MG tablet Commonly known as:  NORVASC Take 1 tablet (10 mg total) by mouth daily.   amoxicillin 500 MG capsule Commonly known as:  AMOXIL Take 1 capsule (500 mg total) by mouth 3 (three) times daily.   aspirin 81 MG tablet Take 81 mg by mouth daily.   B-D UF III MINI PEN NEEDLES 31G X 5 MM Misc Generic drug:  Insulin Pen Needle USE FOR INJECTIONS AS DIRECTED   carvedilol 6.25 MG tablet Commonly known as:  COREG TAKE 1 TABLET BY MOUTH TWICE DAILY WITH A MEAL.   cloNIDine 0.2 mg/24hr patch Commonly known as:  CATAPRES - Dosed in mg/24 hr Place 1 patch (0.2 mg total) onto the skin once a week. Start taking on:  02/16/2018   feeding supplement (ENSURE ENLIVE) Liqd Take 237 mLs by mouth 2 (two) times daily between meals.   furosemide 20 MG tablet Commonly known as:  LASIX Take 1 tablet (20 mg total) by mouth daily. What changed:    medication strength  how much to take  when to take this   geriatric multivitamins-minerals Liqd Take 15 mLs by mouth daily. Start taking on:  02/13/2018   haloperidol 2 MG tablet Commonly known as:  HALDOL Take 1 tablet (2 mg total) by mouth every 4 (four) hours as needed for agitation.   hydrALAZINE 100 MG tablet Commonly known as:  APRESOLINE Take 1 tablet (100 mg total) by mouth 3 (three) times daily. KEEP OV.   insulin detemir 100 UNIT/ML injection Commonly known as:  LEVEMIR Inject 0.1 mLs (10 Units total) into the skin 2 (two) times daily. What changed:    how much to take  how to take this  when to take  this  additional instructions   LUMIGAN 0.01 % Soln Generic drug:  bimatoprost Place 1 drop into the right eye at bedtime.   multivitamin tablet Take 1 tablet by mouth daily.   MYRBETRIQ 50 MG Tb24 tablet Generic drug:  mirabegron ER Take 50 mg by mouth daily.   OLANZapine 5 MG tablet Commonly known as:  ZYPREXA Take 1 tablet (5 mg total) by mouth at bedtime.   ONE TOUCH ULTRA TEST test strip Generic drug:  glucose blood CHECK BLOOD SUGAR 6 TIMES A DAY.   PROBIOTIC DAILY PO Take 1 tablet by mouth daily.   senna-docusate 8.6-50 MG tablet Commonly known as:  Senokot-S Take 1 tablet by mouth at bedtime. What changed:    when to take this  reasons to take this   SIMBRINZA 1-0.2 % Susp Generic drug:  Brinzolamide-Brimonidine Place 1 drop into both eyes 3 (three) times daily.   Vitamin D3 5000 units Caps Take 5,000 Units by mouth 2 (two) times daily.       Contact information for follow-up providers  [name], MD Follow up.   Specialty:  Internal Medicine Contact information: [address] [place] [address] [phone]            Contact information for after-discharge care    [facility] .   Service:  Skilled Nursing Contact information: [address] [address] [phone]                 Allergies  Allergen Reactions  . Ace Inhibitors Other (See Comments) and Cough    Chronic cough  . Demerol Other (See Comments)    Hard to wake up  . Cosopt [Dorzolamide Hcl-Timolol Mal] Other (See Comments)    Cause heart rate to drop  . Flomax [Tamsulosin Hcl] Other (See Comments)    Low BP  . Morphine And Related Other (See Comments)    hallucinations  . Oxytrol [Oxybutynin] Rash    Consultations: none  Procedures/Studies: Ct Abdomen Pelvis Wo Contrast  Result Date: 01/17/2018 CLINICAL DATA:  Abdominal distension over the last year. Worsening  lower extremity edema. EXAM: CT ABDOMEN AND PELVIS WITHOUT CONTRAST TECHNIQUE: Multidetector CT imaging of the abdomen and pelvis was performed following the standard protocol without IV contrast. COMPARISON:  Ultrasound same day FINDINGS: Lower chest: Bilateral pleural effusions layering dependently with dependent pulmonary atelectasis. Mild cardiomegaly. No visible pericardial fluid. Hepatobiliary: Nonspecific 12 mm low-density in the right lobe of the liver image 17. Otherwise no abnormal liver finding. No calcified gallstones. Pancreas: Normal Spleen: Normal Adrenals/Urinary Tract: Right adrenal gland is normal. 2.3 cm left adrenal mass with density is low OS 15 Hounsfield units. Second smaller mass in the left adrenal gland measuring 11 mm of similar density. Kidneys appear normal without contrast. No bladder abnormality is seen. Stomach/Bowel: Fairly large amount of gas within the intestine but without an obstructive pattern. No focal bowel lesion is seen. There is a periumbilical ventral hernia containing only fat. Vascular/Lymphatic: Aortic atherosclerosis. No aneurysm. IVC is normal. No retroperitoneal adenopathy. Reproductive: Normal Other: No free fluid or air. Musculoskeletal: Chronic postsurgical and degenerative changes of the spine. IMPRESSION: No ascites. Large amount of intra-abdominal fat. Moderate amount of gas in the intestine without obstructive pattern. Small periumbilical ventral hernia containing only fat. Two adrenal masses on the left, the larger measuring 2.3 cm in diameter with density as low as 15 Hounsfield units. These are likely to represent benign adenomas. Aortic atherosclerosis. Electronically Signed   By: Nelson Chimes M.D.   On: 01/17/2018 11:40   Dg Chest 2 View  Result Date: 01/16/2018 CLINICAL DATA:  Shortness of breath with exertion, worsening recently. History of congestive heart failure. EXAM: CHEST - 2 VIEW COMPARISON:  10/06/2016. FINDINGS: Cardiomegaly. Aortic  atherosclerosis. Pulmonary venous hypertension. Mild interstitial edema. Small effusions layering dependently. No acute bone finding. IMPRESSION: Congestive heart failure. Cardiomegaly. Pulmonary venous hypertension. Interstitial edema. Small effusions. Electronically Signed   By: Nelson Chimes M.D.   On: 01/16/2018 13:12   Ct Head Wo Contrast  Result Date: 02/09/2018 CLINICAL DATA:  Altered mental status EXAM: CT HEAD WITHOUT CONTRAST TECHNIQUE: Contiguous axial images were obtained from the base of the skull through the vertex without intravenous contrast. COMPARISON:  CT 05/05/2015 FINDINGS: Brain: No acute territorial infarction, hemorrhage or intracranial mass. Atrophy and small vessel ischemic changes of the white matter. Stable ventricle size Vascular: No hyperdense vessels.  Carotid vascular calcification. Skull: No fracture.  Fluid in the left mastoid Sinuses/Orbits: No acute finding.  Other: None IMPRESSION: 1. No CT evidence for acute intracranial abnormality. 2. Atrophy and small vessel ischemic changes of the white matter Electronically Signed   By: Donavan Foil M.D.   On: 02/09/2018 00:36   US Abdomen Limited  Result Date: 01/17/2018 CLINICAL DATA:  Evaluate for ascites.  Distended abdomen EXAM: LIMITED ABDOMEN ULTRASOUND FOR ASCITES TECHNIQUE: Limited ultrasound survey for ascites was performed in all four abdominal quadrants. COMPARISON:  None. FINDINGS: Surveillance images show no ascites. No incidental fluid dilated bowel or gross mass lesion. IMPRESSION: Negative for ascites. Electronically Signed   By: Monte Fantasia M.D.   On: 01/17/2018 07:27   Dg Chest Port 1 View  Result Date: 02/09/2018 CLINICAL DATA:  Altered mental status EXAM: PORTABLE CHEST 1 VIEW COMPARISON:  01/16/2018, 10/06/2016 FINDINGS: Cardiomegaly with vascular congestion. Patchy bibasilar atelectasis. No pleural effusion. No pneumothorax. IMPRESSION: Cardiomegaly with vascular congestion and bibasilar atelectasis  Electronically Signed   By: Donavan Foil M.D.   On: 02/09/2018 00:33    (Echo, Carotid, EGD, Colonoscopy, ERCP)    Subjective:   Discharge Exam: Vitals:   02/11/18 2238 02/12/18 0650  BP: (!) 160/60 (!) 160/67  Pulse: 71 62  Resp: 20 16  Temp: 98.8 F (37.1 C) 99.5 F (37.5 C)  SpO2: 97% 98%   Vitals:   02/11/18 1338 02/11/18 2238 02/12/18 0500 02/12/18 0650  BP: (!) 153/61 (!) 160/60  (!) 160/67  Pulse: (!) 56 71  62  Resp: 12 20  16   Temp: 98.8 F (37.1 C) 98.8 F (37.1 C)  99.5 F (37.5 C)  TempSrc: Oral Oral  Oral  SpO2: 99% 97%  98%  Weight:   96.8 kg   Height:        General: Pt is alert, awake, not in acute distress Cardiovascular: RRR, S1/S2 +, no rubs, no gallops Respiratory: CTA bilaterally, no wheezing, no rhonchi Abdominal: Soft, NT, ND, bowel sounds + Extremities: no edema, no cyanosis    The results of significant diagnostics from this hospitalization (including imaging, microbiology, ancillary and laboratory) are listed below for reference.     Microbiology: Recent Results (from the past 240 hour(s))  Urine Culture     Status: Abnormal   Collection Time: 02/08/18  9:18 AM  Result Value Ref Range Status   MICRO NUMBER: [id number]  Final   SPECIMEN QUALITY: ADEQUATE  Final   Sample Source URINE  Final   STATUS: FINAL  Final   ISOLATE 1: Enterococcus faecalis (A)  Final    Comment: Greater than 100,000 CFU/mL of Enterococcus faecalis      Susceptibility   Enterococcus faecalis - URINE CULTURE POSITIVE 1    AMPICILLIN <=2 Sensitive     VANCOMYCIN 1 Sensitive     NITROFURANTOIN* <=16 Sensitive      * Legend:S = Susceptible  I = IntermediateR = Resistant  NS = Not susceptible* = Not tested  NR = Not reported**NN = See antimicrobic comments  Blood culture (routine x 2)     Status: None (Preliminary result)   Collection Time: 02/09/18 12:16 AM  Result Value Ref Range Status   Specimen Description   Final    BLOOD LEFT FOREARM Performed at  [facility], [address] [place], [place] [address]    Special Requests   Final    BOTTLES DRAWN AEROBIC AND ANAEROBIC Blood Culture adequate volume Performed at [place] [address], [place], [place] [address]    Culture  Final    NO GROWTH 3 DAYS Performed at [facility], [place] [address] [place], [place] [address]    Report Status PENDING  Incomplete  Urine culture     Status: Abnormal   Collection Time: 02/09/18  2:12 AM  Result Value Ref Range Status   Specimen Description   Final    URINE, CLEAN CATCH Performed at [facility], [place] [address] [place], [place] [address]    Special Requests   Final    NONE Performed at [facility], [place] [address], [place],  [address]    Culture >=100,000 COLONIES/mL ENTEROCOCCUS FAECALIS (A)  Final   Report Status 02/11/2018 FINAL  Final   Organism ID, Bacteria ENTEROCOCCUS FAECALIS (A)  Final      Susceptibility   Enterococcus faecalis - MIC*    AMPICILLIN <=2 SENSITIVE Sensitive     LEVOFLOXACIN 1 SENSITIVE Sensitive     NITROFURANTOIN <=16 SENSITIVE Sensitive     VANCOMYCIN 1 SENSITIVE Sensitive     * >=100,000 COLONIES/mL ENTEROCOCCUS FAECALIS     Labs: BNP (last 3 results) Recent Labs    01/16/18 1118 01/16/18 1404  BNP 599* 893.7*   Basic Metabolic Panel: Recent Labs  Lab 02/08/18 0918 02/09/18 0016 02/10/18 0507 02/11/18 0445 02/12/18 0426  NA 144 149* 144 143 141  K 3.8 4.5 3.0* 3.4* 3.6  CL 108 112* 113* 113* 109  CO2 26 26 23 23 22   GLUCOSE 251* 88 146* 157* 195*  BUN 38* 38* 26* 25* 27*  CREATININE 2.14* 2.21* 1.57* 1.50* 1.73*  CALCIUM 9.2 9.4 8.5* 8.6* 8.7*   Liver Function Tests: Recent Labs  Lab 02/08/18 0918 02/09/18 0016  AST 19 44*  ALT 17 19  ALKPHOS  --  55  BILITOT 0.4 1.2  PROT 6.7 7.1  ALBUMIN  --  4.0   No results for input(s): LIPASE, AMYLASE in the last 168 hours. No results for input(s):  AMMONIA in the last 168 hours. CBC: Recent Labs  Lab 02/08/18 0918 02/09/18 0016 02/10/18 0507  WBC 5.1 5.5 6.9  NEUTROABS 2,586 2.6 3.7  HGB 12.1* 12.1* 12.1*  HCT 37.2* 38.2* 39.0  MCV 89.6 92.0 93.5  PLT 217 218 203   Cardiac Enzymes: Recent Labs  Lab 02/09/18 0016  TROPONINI 0.03*   BNP: Invalid input(s): POCBNP CBG: Recent Labs  Lab 02/11/18 0758 02/11/18 1214 02/11/18 1631 02/11/18 2231 02/12/18 1148  GLUCAP 142* 194* 263* 216* 210*   D-Dimer No results for input(s): DDIMER in the last 72 hours. Hgb A1c No results for input(s): HGBA1C in the last 72 hours. Lipid Profile No results for input(s): CHOL, HDL, LDLCALC, TRIG, CHOLHDL, LDLDIRECT in the last 72 hours. Thyroid function studies No results for input(s): TSH, T4TOTAL, T3FREE, THYROIDAB in the last 72 hours.  Invalid input(s): FREET3 Anemia work up No results for input(s): VITAMINB12, FOLATE, FERRITIN, TIBC, IRON, RETICCTPCT in the last 72 hours. Urinalysis    Component Value Date/Time   COLORURINE YELLOW 02/09/2018 0212   APPEARANCEUR CLEAR 02/09/2018 0212   LABSPEC 1.014 02/09/2018 0212   PHURINE 5.0 02/09/2018 0212   GLUCOSEU NEGATIVE 02/09/2018 0212   HGBUR NEGATIVE 02/09/2018 0212   BILIRUBINUR NEGATIVE 02/09/2018 0212   KETONESUR NEGATIVE 02/09/2018 0212   PROTEINUR NEGATIVE 02/09/2018 0212   UROBILINOGEN 0.2 05/05/2015 1023   NITRITE NEGATIVE 02/09/2018 0212   LEUKOCYTESUR SMALL (A) 02/09/2018 0212   Sepsis Labs Invalid input(s): PROCALCITONIN,  WBC,  LACTICIDVEN Microbiology  Recent Results (from the past 240 hour(s))  Urine Culture     Status: Abnormal   Collection Time: 02/08/18  9:18 AM  Result Value Ref Range Status   MICRO NUMBER: [id number]  Final   SPECIMEN QUALITY: ADEQUATE  Final   Sample Source URINE  Final   STATUS: FINAL  Final   ISOLATE 1: Enterococcus faecalis (A)  Final    Comment: Greater than 100,000 CFU/mL of Enterococcus faecalis      Susceptibility    Enterococcus faecalis - URINE CULTURE POSITIVE 1    AMPICILLIN <=2 Sensitive     VANCOMYCIN 1 Sensitive     NITROFURANTOIN* <=16 Sensitive      * Legend:S = Susceptible  I = IntermediateR = Resistant  NS = Not susceptible* = Not tested  NR = Not reported**NN = See antimicrobic comments  Blood culture (routine x 2)     Status: None (Preliminary result)   Collection Time: 02/09/18 12:16 AM  Result Value Ref Range Status   Specimen Description   Final    BLOOD LEFT FOREARM Performed at [facility], [address], [place], [place] [address]    Special Requests   Final    BOTTLES DRAWN AEROBIC AND ANAEROBIC Blood Culture adequate volume Performed at [place] [address], [place], [place] [address]    Culture   Final    NO GROWTH 3 DAYS Performed at [facility], [place] [address], [place], [place] [address]    Report Status PENDING  Incomplete  Urine culture     Status: Abnormal   Collection Time: 02/09/18  2:12 AM  Result Value Ref Range Status   Specimen Description   Final    URINE, CLEAN CATCH Performed at [facility], [place] [address], [place], [place] [address]    Special Requests   Final    NONE Performed at [facility], [place] [address] [place], [place] [address]    Culture >=100,000 COLONIES/mL ENTEROCOCCUS FAECALIS (A)  Final   Report Status 02/11/2018 FINAL  Final   Organism ID, Bacteria ENTEROCOCCUS FAECALIS (A)  Final      Susceptibility   Enterococcus faecalis - MIC*    AMPICILLIN <=2 SENSITIVE Sensitive     LEVOFLOXACIN 1 SENSITIVE Sensitive     NITROFURANTOIN <=16 SENSITIVE Sensitive     VANCOMYCIN 1 SENSITIVE Sensitive     * >=100,000 COLONIES/mL ENTEROCOCCUS FAECALIS     Time coordinating discharge: 35 minutes  SIGNED:   Georgette [place], MD  [facility] 02/12/2018, 12:26 PM Pager   If 7PM-7AM, please contact night-coverage [email or web address] Password [email or web address]

## 2018-02-12 NOTE — Progress Notes (Signed)
Unna boots removed and BLE washed and dried. Ortho tech is here to apply new unna boots and right arm splint.Donne Hazel, RN

## 2018-02-12 NOTE — Progress Notes (Signed)
RN called [facility]  To give report to nurse receiving patient. [facility] was called at 2030 to set up transport for patient.

## 2018-02-12 NOTE — Progress Notes (Signed)
Physical Therapy Treatment Patient Details Name: Gregory Hatfield MRN: [id number] DOB: [birth year] Today's Date: 02/12/2018    History of Present Illness 84 y.o. male with medical history significant of diastolic CHF, CKD stage IV, hypertension, DMII,  Admitted  for AMS, encephalopathy.    PT Comments    The patient is complaining of right elbow pain and is noted to not be able to use it for mobility as he was on 02/10/18 evaluation. The patient also having more difficulty with balance.  Plans for SNF.  Follow Up Recommendations  SNF     Equipment Recommendations  None recommended by PT    Recommendations for Other Services       Precautions / Restrictions Precautions Precautions: Fall    Mobility  Bed Mobility Overal bed mobility: Needs Assistance Bed Mobility: Supine to Sit     Supine to sit: Mod assist     General bed mobility comments: increased time to get up, patient unable to use the right UE which is much different than last visit by PT  Transfers Overall transfer level: Needs assistance Equipment used: Rolling walker (2 wheeled) Transfers: Sit to/from Stand Sit to Stand: Mod assist;+2 safety/equipment;From elevated surface         General transfer comment: patient able to reach forward to the  RW, holds the hand somewhat stiff and has decreased grip with right hand, Steady assist for standing as patient leaning posteriorly, also noted to have more difficulty taking steps to transfer to recliner. Noted  more difficulty moving the right leg and weight bearing on the left leg.  Multimodal cues for safety and reaching back to recliner., patient stumbling and decreased balance  Ambulation/Gait                 Stairs             Wheelchair Mobility    Modified Rankin (Stroke Patients Only)       Balance Overall balance assessment: Needs assistance;History of Falls Sitting-balance support: Feet supported;No upper extremity supported Sitting  balance-Leahy Scale: Poor Sitting balance - Comments: tending to lean backwards Postural control: Posterior lean Standing balance support: Bilateral upper extremity supported Standing balance-Leahy Scale: Poor Standing balance comment: requires steadying during standing, relies on RW for UE support                             Cognition Arousal/Alertness: Awake/alert Behavior During Therapy: WFL for tasks assessed/performed Overall Cognitive Status: No family/caregiver present to determine baseline cognitive functioning                                 General Comments: patient reports that the  right elbow began to hurt sometime during the night but does not know why      Exercises      General Comments        Pertinent Vitals/Pain Pain Assessment: Faces Faces Pain Scale: Hurts whole lot Pain Location: right elbow to bend  and straighten especially supination.  Pain Descriptors / Indicators: Discomfort;Grimacing;Guarding;Tender Pain Intervention(s): Monitored during session;Limited activity within patient's tolerance;Repositioned    Home Living                      Prior Function            PT Goals (current goals can now be found in the care  plan section) Progress towards PT goals: Progressing toward goals    Frequency    Min 2X/week      PT Plan Current plan remains appropriate    Co-evaluation              AM-PAC PT "6 Clicks" Daily Activity  Outcome Measure  Difficulty turning over in bed (including adjusting bedclothes, sheets and blankets)?: Unable Difficulty moving from lying on back to sitting on the side of the bed? : Unable Difficulty sitting down on and standing up from a chair with arms (e.g., wheelchair, bedside commode, etc,.)?: Unable Help needed moving to and from a bed to chair (including a wheelchair)?: Total Help needed walking in hospital room?: Total Help needed climbing 3-5 steps with a railing? :  Total 6 Click Score: 6    End of Session Equipment Utilized During Treatment: Gait belt Activity Tolerance: Patient limited by pain Patient left: in chair;with call bell/phone within reach;with chair alarm set Nurse Communication: Mobility status PT Visit Diagnosis: Unsteadiness on feet (R26.81);Other abnormalities of gait and mobility (R26.89)     Time: 1213-1227 PT Time Calculation (min) (ACUTE ONLY): 14 min  Charges:  $Therapeutic Activity: 8-22 mins                     [facility] PT [phone]    Claretha Cooper 02/12/2018, 1:00 PM

## 2018-02-12 NOTE — Progress Notes (Signed)
Patient vomited and received Zofran. Dr Rodena Piety called and says patient can be discharged if he tolerates his lunch. Donne Hazel, RN

## 2018-02-13 ENCOUNTER — Ambulatory Visit: Payer: Medicare Other | Admitting: Internal Medicine

## 2018-02-13 ENCOUNTER — Other Ambulatory Visit: Payer: Self-pay | Admitting: Endocrinology

## 2018-02-13 NOTE — Progress Notes (Signed)
[facility] arrived to transport patient. Granddaughter was at the bedside and accompanied patient.

## 2018-02-14 LAB — CULTURE, BLOOD (ROUTINE X 2)
Culture: NO GROWTH
Special Requests: ADEQUATE

## 2018-02-22 ENCOUNTER — Other Ambulatory Visit: Payer: Self-pay | Admitting: Endocrinology

## 2018-02-22 ENCOUNTER — Other Ambulatory Visit: Payer: Self-pay

## 2018-02-23 ENCOUNTER — Ambulatory Visit: Payer: Medicare Other | Admitting: Physician Assistant

## 2018-03-08 ENCOUNTER — Encounter: Payer: Self-pay | Admitting: Internal Medicine

## 2018-03-12 ENCOUNTER — Telehealth: Payer: Self-pay

## 2018-03-12 NOTE — Telephone Encounter (Signed)
Monique from [place] asking for verbal orders for PT for patient. PT for strength and transfer training, gait and balance and home safety. 1 for 1 week and 2 for 5 weeks.  Verbal orders given.

## 2018-03-21 NOTE — Progress Notes (Signed)
[facility]   [name], M.D.     Gregory Hatfield. Gregory Hatfield, P.A.-C Gregory Hatfield, [address]                [place], [place] [address] Telephone [phone] Telefax [phone] Annual  Screening/Preventative Visit  & Comprehensive Evaluation & Examination     This very nice 84 y.o. [name]  presents for a Screening /Preventative Visit & comprehensive evaluation and management of multiple medical co-morbidities.  Patient has been followed for HTN, HLD, T2_IDDM/CKD4, Vascular Dementia, Glaucoma  and Vitamin D Deficiency.     Patient has hospitalized in April 2019 with bradycardia recovering with tapering of his Beta Blocker meds. He was hospitalized 7/30-01/20/2018 with acute on chronic CHF and diuresed. Then he was re-admitted 8/22-8/26 with acute metabolic encephalopathy (combative agitation) attributed to UTI (Enterococcus). He was started on Zyprexa (Olanzapine) that admission.  He fell day of admission & sustained a Rt radial neck fracture and is in a cast. He was discharged to SNF for rehab. Patient Dementia is getting worse. Head CT scan on 8/23 showed chroic vascular ischemic changes.  Patient is currently residing at [facility]. He is also followed at the [facility] by Dr Herold Harms.          HTN predates circa 1992. Patient's BP's historically have been very labile. Today's BP was initially elevated & rechecked at goal - 140/84. Patient denies any cardiac symptoms as chest pain, palpitations, shortness of breath, dizziness or ankle swelling.     Patient's hyperlipidemia is controlled with diet and medications. Patient denies myalgias or other medication SE's. Last lipids were  Lab Results  Component Value Date   CHOL 175 12/20/2017   HDL 41 12/20/2017   LDLCALC 109 (H) 12/20/2017   TRIG 137 12/20/2017   CHOLHDL 4.3 12/20/2017      Patient has hx/o T2_NIDDM since 1975  treated initially with oral agents and transitioned to insulin in 1997. Patient has CKD3 followed by Dr Mercy Moore. His diabetic control has been compromised by his Dementia despite simplification of hid sliding scale regimens by Dr Loanne Drilling - his Endocrinologist. Patient denies reactive hypoglycemic symptoms, visual blurring, diabetic polys or paresthesias. Last A1c was 8.2% on June 25th and not at goal.       His 2 daughters accompanying  Gregory Hatfield today express concern over his intensive sliding scale regimen and whether it could be simplified for the Nursing staff .     Finally, patient has history of Vitamin D Deficiency  and last vitamin D was 102 in March - at goal:  Current Outpatient Medications on File Prior to Visit  Medication Sig  . amLODipine10 MG tablet Take 1 tablet  daily.  Marland Kitchen aspirin 81 MG tablet Take  daily.  . carvedilol  6.25 MG tablet TAKE 1 TABLET TWICE DAILY .  Marland Kitchen VITAMIN D3 5000 UNITS  Take 5,000 Units  2  x daily.   . cloNIDine 0.2 mg/24hr patch Place 1 patch  once a week.  . furosemide  20 MG tablet Take 1 tablet  daily.  . haloperidol  2 MG tablet Take 1 tablet  every 4 hours as needed for agitation.  . hydrALAZINE  100 MG tablet Take 1 tablet  3 x daily  . LEVEMIR 100 U/ML inject Inject 10 Units 2 x daily.  Marland Kitchen LEVEMIR FLEXTOUCH  INJEC 50 UNITS - AM AND 25 UNITS - PM  . LUMIGAN 0.01 % SOLN Place 1 drop into - right eye at bedtime.   . Multiple Vitamin  tablet Take 1 tablet  daily.    Marland Kitchen MYRBETRIQ 50 MG TB24 tablet Take  daily.   Marland Kitchen OLANZapine (ZYPREXA) 5 MG tablet Take 1 tablet at bedtime.  Marland Kitchen PROBIOTIC DAILY  Take 1 tablet  daily.   [name]  Take 1 tablet  at bedtime as needed  . SIMBRINZA 1-0.2 % SUSP Place 1 drop - both eyes 3 x daily.   Allergies  Allergen Reactions  . Ace Inhibitors Other (See Comments) and Cough    Chronic cough  . Demerol Other (See Comments)    Hard to wake up  . Cosopt [Dorzolamide Hcl-Timolol Mal] Other (See Comments)    Cause heart  rate to drop  . Flomax [Tamsulosin Hcl] Other (See Comments)    Low BP  . Morphine And Related Other (See Comments)    hallucinations  . Oxytrol [Oxybutynin] Rash   Past Medical History:  Diagnosis Date  . Bradycardia 10/06/2016  . CHF (congestive heart failure) ([place])   . CKD stage 4 due to type 2 diabetes mellitus ([place]) 06/03/2015  . Dyslipidemia associated with type 2 diabetes mellitus ([place]) 05/05/2015  . Essential hypertension 05/05/2015  . IDDM (insulin dependent diabetes mellitus) ([place])   . SDAT (senile dementia of Alzheimer's type) 06/16/2013  . Uncontrolled insulin-dependent diabetes mellitus with renal manifestation ([place]) 05/05/2015   Health Maintenance  Topic Date Due  . OPHTHALMOLOGY EXAM  02/26/2016  . INFLUENZA VACCINE  01/18/2018  . HEMOGLOBIN A1C  06/13/2018  . TETANUS/TDAP  08/29/2018  . FOOT EXAM  03/23/2019  . PNA vac Low Risk Adult  Completed   Immunization History  Administered Date(s) Administered  . DTaP 09/28/2006  . Influenza Whole 04/05/2010, 04/04/2013  . Influenza, High Dose Seasonal PF 04/02/2014, 03/30/2015  . Influenza-Unspecified 02/19/2011, 02/08/2018  . Pneumococcal Conjugate-13 06/10/2014  . Pneumococcal Polysaccharide-23 07/21/2001, 03/20/2008  . Td 08/28/2008   Last Colon - 06/20/2008 - Dr Amedeo Plenty - No f/u due to age  Past Surgical History:  Procedure Laterality Date  . CARDIAC CATHETERIZATION  04/2004   normal r-sided pressure, mild pulm HTN  . CARPAL TUNNEL RELEASE Right   . CERVICAL SPINE SURGERY  1990's  . LUMBAR SPINE SURGERY  2003  . PROSTATE SURGERY  10/2010   transurethral resection   . TOTAL HIP ARTHROPLASTY Left 01/17/2014   Procedure: LEFT TOTAL HIP ARTHROPLASTY;  Surgeon: Yvette Rack., MD;  Location: [facility];  Service: Orthopedics;  Laterality: Left;  . TRANSTHORACIC ECHOCARDIOGRAM  05/2006   EF 60-70%; mild calcif of MV; mild MV regurg; LA mildly dilated  . TRANSURETHRAL RESECTION OF PROSTATE N/A 08/22/2014   Procedure:  TRANSURETHRAL RESECTION OF THE PROSTATE (TURP);  Surgeon: Marcia Brash, MD;  Location: [facility];  Service: Urology;  Laterality: N/A;   Family History  Problem Relation Age of Onset  . Diabetes Mother   . Hypertension Mother   . Prostate cancer Father   . Heart disease Father   . Diabetes Brother   . Cancer Brother   . Heart disease Brother   . Diabetes Sister   . Heart disease Sister   . Hyperlipidemia Child   . Hypertension Child   . Diabetes Child   . Heart attack Child   . Cancer Child    Social History   Socioeconomic History  .  Marital status: Widowed  . Number of children: 1 son, 2 daughters, 7 GC  . Years of education: master's  Occupational History  . 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 Smoking status: Former Smoker    Last attempt to quit: 06/21/1983    Years since quitting: 34.7  . Smokeless tobacco: Never Used  Substance and Sexual Activity  . Alcohol use: No  . Drug use: No  . Sexual activity: No    ROS Constitutional: Denies fever, chills, weight loss/gain, headaches, insomnia,  night sweats or change in appetite. Does c/o fatigue. Eyes: Denies redness, blurred vision, diplopia, discharge, itchy or watery eyes. 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 ENT: Denies discharge, congestion, post nasal drip, epistaxis, sore throat, earache, hearing loss, dental pain, Tinnitus, Vertigo, Sinus pain or snoring.  Cardio: Denies chest pain, palpitations, irregular heartbeat, syncope, dyspnea, diaphoresis, orthopnea, PND, claudication or edema Respiratory: denies cough, dyspnea, DOE, pleurisy, hoarseness, laryngitis or wheezing.  Gastrointestinal: Denies dysphagia, heartburn, reflux, water brash, pain, cramps, nausea, vomiting, bloating, diarrhea, constipation, hematemesis, melena, hematochezia, jaundice or hemorrhoids Genitourinary: Denies dysuria, frequency, urgency, nocturia, hesitancy, discharge, hematuria or flank  pain Musculoskeletal: Denies arthralgia, myalgia, stiffness, Jt. Swelling, pain, limp or strain/sprain. Denies Falls. Skin: Denies puritis, rash, hives, warts, acne, eczema or change in skin lesion Neuro: No weakness, tremor, incoordination, spasms, paresthesia or pain Endocrine: Denies change in weight, skin, hair change, nocturia, and paresthesia, diabetic polys, visual blurring or hyper / hypo glycemic episodes.  Heme/Lymph: No excessive bleeding, bruising or enlarged lymph nodes.  Physical Exam  BP 140/84   P 60   T 97.2 F   R 18     in W/C - Unable to weigh or measure height  General Appearance: Well nourished and well groomed and in no apparent distress.  Eyes: PERRLA, EOMs, conjunctiva no swelling or erythema, (+) Red reflex,  fundi not well visualized. Sinuses: No frontal/maxillary tenderness ENT/Mouth: EACs patent / TMs  nl. Nares clear without erythema, swelling, mucoid exudates. Oral hygiene is good. No erythema, swelling, or exudate. Tongue normal, non-obstructing. Tonsils not swollen or erythematous. Hearing normal.  Neck: Supple, thyroid not palpable. No bruits, nodes or JVD. Respiratory: Respiratory effort normal.  BS equal and clear bilateral without rales, rhonci, wheezing or stridor. Cardio: Heart sounds are normal with regular rate and rhythm and no murmurs, rubs or gallops. Peripheral pulses are normal and equal bilaterally without edema. No aortic or femoral bruits. Chest: symmetric with normal excursions and percussion.  Abdomen: Soft, with Nl bowel sounds. Nontender, no guarding, rebound, hernias, masses, or organomegaly.  Lymphatics: Non tender without lymphadenopathy.  Genitourinary: in Diapers. DRE - deferred for age. Musculoskeletal: RUE in flexion cast. Generalized decrease in muscle power, tone & bulk. In W/C . Skin: Warm and dry without rashes, lesions, cyanosis, clubbing or  ecchymosis.  Neuro: Cranial nerves intact. DTR's flat thru-out. Normal muscle  tone, no cerebellar symptoms. Sensation to touch, but decreased to vibratory and Monofilament to the toes bilaterally. Pysch: Alert and oriented X 3 with poor insight and judgment  Assessment and Plan  1. Annual Preventative/Screening Exam   2. Essential hypertension  - EKG 12-Lead - [place], RETROPERITNL ABD,  LTD - CBC with Differential/Platelet - COMPLETE METABOLIC PANEL WITH GFR - Magnesium - Urinalysis, Routine w reflex microscopic  3. Hyperlipidemia, mixed  - EKG 12-Lead - [place], RETROPERITNL ABD,  LTD - Lipid panel  4. Type 2 diabetes mellitus with stage 3  chronic kidney disease, with long-term use of insulin (HCC)  - EKG 12-Lead - [place], RETROPERITNL ABD,  LTD - HM DIABETES FOOT EXAM - LOW EXTREMITY NEUR EXAM DOCUM - Hemoglobin A1c - Urinalysis, Routine w reflex microscopic  5. Vitamin D deficiency  - VITAMIN D 25 Hydroxyl  6. Insulin-requiring or dependent type II diabetes mellitus ([place])  - EKG 12-Lead - [place], RETROPERITNL ABD,  LTD - HM DIABETES FOOT EXAM - LOW EXTREMITY NEUR EXAM DOCUM - Hemoglobin A1c  7. CKD (chronic kidney disease) stage 3, GFR 30-59 ml/min (HCC)  - Microalbumin / creatinine urine ratio - COMPLETE METABOLIC PANEL WITH GFR - Urinalysis, Routine w reflex microscopic  8. Diabetic retinopathy without macular edema associated with diabetes mellitus ([place])  - Hemoglobin A1c  9. Chronic combined systolic and diastolic CHF (congestive heart failure) ([place])   10. Gout, unspecified cause, unspecified chronicity, unspecified site  - Uric acid  11. Screening for colorectal cancer  - hemoccult  12. BPH with obstruction/lower urinary tract symptoms  - PSA  13. Prostate cancer screening  - PSA  14. SDAT (senile dementia of Alzheimer's type) ([place])  15. Screening for ischemic heart disease  - EKG 12-Lead  16. Screening for AAA (aortic abdominal aneurysm)  - [place], RETROPERITNL ABD,  LTD  17. Former smoker  - EKG 12-Lead - [place],  RETROPERITNL ABD,  LTD  18. FHx: heart disease  - EKG 12-Lead - [place], RETROPERITNL ABD,  LTD  19. Medication management  - Microalbumin / creatinine urine ratio - CBC with Differential/Platelet - COMPLETE METABOLIC PANEL WITH GFR - Magnesium - Lipid panel - Hemoglobin A1c - VITAMIN D 25 Hydroxyl - Uric acid - Urinalysis, Routine w reflex microscopic     Discussed with daughters, in consideration of his worsening Dementia and the difficulty in transporting him out of the facility, that it is recommended that his medical care be transferred to the facility House Physician. In consideration of cost concerns and convenience, it would seem reasonable to switch his Insulin to Novolin 70/30 on a bid schedule starting with 50 units at Breakfast and 25 units at supper. His daughters were assured that the attending house physician would be able to closely monitor and titrate dosing as he felt indicated.        Patient to continue with  BP monitoring at the facility. Discussed med effects and SE's. Routine screening labs and tests as requested today Over 50 minutes of exam, counseling, chart review and high complex critical decision making was performed

## 2018-03-22 ENCOUNTER — Ambulatory Visit: Payer: Medicare Other | Admitting: Internal Medicine

## 2018-03-22 ENCOUNTER — Telehealth: Payer: Self-pay | Admitting: Endocrinology

## 2018-03-22 ENCOUNTER — Encounter: Payer: Self-pay | Admitting: Internal Medicine

## 2018-03-22 VITALS — BP 140/84 | HR 60 | Temp 97.2°F | Resp 18

## 2018-03-22 DIAGNOSIS — E782 Mixed hyperlipidemia: Secondary | ICD-10-CM

## 2018-03-22 DIAGNOSIS — N138 Other obstructive and reflux uropathy: Secondary | ICD-10-CM

## 2018-03-22 DIAGNOSIS — I5042 Chronic combined systolic (congestive) and diastolic (congestive) heart failure: Secondary | ICD-10-CM

## 2018-03-22 DIAGNOSIS — E08319 Diabetes mellitus due to underlying condition with unspecified diabetic retinopathy without macular edema: Secondary | ICD-10-CM

## 2018-03-22 DIAGNOSIS — G301 Alzheimer's disease with late onset: Secondary | ICD-10-CM

## 2018-03-22 DIAGNOSIS — N183 Chronic kidney disease, stage 3 unspecified: Secondary | ICD-10-CM

## 2018-03-22 DIAGNOSIS — E559 Vitamin D deficiency, unspecified: Secondary | ICD-10-CM

## 2018-03-22 DIAGNOSIS — M109 Gout, unspecified: Secondary | ICD-10-CM

## 2018-03-22 DIAGNOSIS — Z794 Long term (current) use of insulin: Secondary | ICD-10-CM

## 2018-03-22 DIAGNOSIS — Z8249 Family history of ischemic heart disease and other diseases of the circulatory system: Secondary | ICD-10-CM

## 2018-03-22 DIAGNOSIS — Z1211 Encounter for screening for malignant neoplasm of colon: Secondary | ICD-10-CM

## 2018-03-22 DIAGNOSIS — Z1212 Encounter for screening for malignant neoplasm of rectum: Secondary | ICD-10-CM

## 2018-03-22 DIAGNOSIS — Z87891 Personal history of nicotine dependence: Secondary | ICD-10-CM

## 2018-03-22 DIAGNOSIS — N401 Enlarged prostate with lower urinary tract symptoms: Secondary | ICD-10-CM

## 2018-03-22 DIAGNOSIS — E1122 Type 2 diabetes mellitus with diabetic chronic kidney disease: Secondary | ICD-10-CM

## 2018-03-22 DIAGNOSIS — E119 Type 2 diabetes mellitus without complications: Secondary | ICD-10-CM

## 2018-03-22 DIAGNOSIS — F0280 Dementia in other diseases classified elsewhere without behavioral disturbance: Secondary | ICD-10-CM

## 2018-03-22 DIAGNOSIS — Z79899 Other long term (current) drug therapy: Secondary | ICD-10-CM

## 2018-03-22 DIAGNOSIS — Z0001 Encounter for general adult medical examination with abnormal findings: Secondary | ICD-10-CM

## 2018-03-22 DIAGNOSIS — Z0000 Encounter for general adult medical examination without abnormal findings: Secondary | ICD-10-CM

## 2018-03-22 DIAGNOSIS — I10 Essential (primary) hypertension: Secondary | ICD-10-CM

## 2018-03-22 DIAGNOSIS — Z136 Encounter for screening for cardiovascular disorders: Secondary | ICD-10-CM | POA: Diagnosis not present

## 2018-03-22 DIAGNOSIS — Z125 Encounter for screening for malignant neoplasm of prostate: Secondary | ICD-10-CM

## 2018-03-22 NOTE — Telephone Encounter (Signed)
Velva Harman called ph# [phone] re: concerned about the changes to patient's medications since going into assisted living. Durenda Age has changes dosages, etc (Detemir-15 units 2 x per day/Novalog on a sliding scale) . Please call Velva Harman at the ph# listed above to discuss. Patient went to assisted living 2 weeks ago.

## 2018-03-22 NOTE — Patient Instructions (Signed)

## 2018-03-22 NOTE — Telephone Encounter (Signed)
lft vm for Gregory Hatfield to return my call

## 2018-03-23 LAB — LIPID PANEL
CHOL/HDL RATIO: 5.5 (calc) — AB (ref <5.0)
CHOLESTEROL: 186 mg/dL (ref <200)
HDL: 34 mg/dL — ABNORMAL LOW (ref >40)
LDL CHOLESTEROL (CALC): 127 mg/dL — AB
Non-HDL Cholesterol (Calc): 152 mg/dL (calc) — ABNORMAL HIGH (ref <130)
TRIGLYCERIDES: 132 mg/dL (ref <150)

## 2018-03-23 LAB — COMPLETE METABOLIC PANEL WITH GFR
AG Ratio: 1.3 (calc) (ref 1.0–2.5)
ALBUMIN MSPROF: 3.8 g/dL (ref 3.6–5.1)
ALKALINE PHOSPHATASE (APISO): 77 U/L (ref 40–115)
ALT: 10 U/L (ref 9–46)
AST: 15 U/L (ref 10–35)
BUN / CREAT RATIO: 12 (calc) (ref 6–22)
BUN: 18 mg/dL (ref 7–25)
CALCIUM: 9.1 mg/dL (ref 8.6–10.3)
CO2: 28 mmol/L (ref 20–32)
CREATININE: 1.5 mg/dL — AB (ref 0.70–1.11)
Chloride: 107 mmol/L (ref 98–110)
GFR, Est African American: 47 mL/min/{1.73_m2} — ABNORMAL LOW (ref >=60)
GFR, Est Non African American: 40 mL/min/{1.73_m2} — ABNORMAL LOW (ref >=60)
GLOBULIN: 3 g/dL (ref 1.9–3.7)
GLUCOSE: 175 mg/dL — AB (ref 65–99)
Potassium: 4 mmol/L (ref 3.5–5.3)
SODIUM: 143 mmol/L (ref 135–146)
Total Bilirubin: 0.4 mg/dL (ref 0.2–1.2)
Total Protein: 6.8 g/dL (ref 6.1–8.1)

## 2018-03-23 LAB — CBC WITH DIFFERENTIAL/PLATELET
Basophils Absolute: 40 cells/uL (ref 0–200)
Basophils Relative: 0.8 %
EOS PCT: 3.4 %
Eosinophils Absolute: 170 cells/uL (ref 15–500)
HEMATOCRIT: 37 % — AB (ref 38.5–50.0)
HEMOGLOBIN: 12.2 g/dL — AB (ref 13.2–17.1)
LYMPHS ABS: 1980 {cells}/uL (ref 850–3900)
MCH: 28.2 pg (ref 27.0–33.0)
MCHC: 33 g/dL (ref 32.0–36.0)
MCV: 85.5 fL (ref 80.0–100.0)
MPV: 13.3 fL — ABNORMAL HIGH (ref 7.5–12.5)
Monocytes Relative: 8.8 %
NEUTROS ABS: 2370 {cells}/uL (ref 1500–7800)
Neutrophils Relative %: 47.4 %
Platelets: 207 10*3/uL (ref 140–400)
RBC: 4.33 10*6/uL (ref 4.20–5.80)
RDW: 12.9 % (ref 11.0–15.0)
Total Lymphocyte: 39.6 %
WBC: 5 10*3/uL (ref 3.8–10.8)
WBCMIX: 440 {cells}/uL (ref 200–950)

## 2018-03-23 LAB — MICROALBUMIN / CREATININE URINE RATIO
Creatinine, Urine: 71 mg/dL (ref 20–320)
MICROALB UR: 46.1 mg/dL
Microalb Creat Ratio: 649 mcg/mg creat — ABNORMAL HIGH (ref <30)

## 2018-03-23 LAB — HEMOGLOBIN A1C
HEMOGLOBIN A1C: 9.1 %{Hb} — AB (ref <5.7)
Mean Plasma Glucose: 214 (calc)
eAG (mmol/L): 11.9 (calc)

## 2018-03-23 LAB — URINALYSIS, ROUTINE W REFLEX MICROSCOPIC
BILIRUBIN URINE: NEGATIVE
GLUCOSE, UA: NEGATIVE
HGB URINE DIPSTICK: NEGATIVE
Hyaline Cast: NONE SEEN /LPF
KETONES UR: NEGATIVE
LEUKOCYTES UA: NEGATIVE
Nitrite: NEGATIVE
PH: 6.5 (ref 5.0–8.0)
Specific Gravity, Urine: 1.014 (ref 1.001–1.03)
Squamous Epithelial / LPF: NONE SEEN /HPF (ref <=5)

## 2018-03-23 LAB — MAGNESIUM: MAGNESIUM: 1.8 mg/dL (ref 1.5–2.5)

## 2018-03-23 LAB — URIC ACID: URIC ACID, SERUM: 4 mg/dL (ref 4.0–8.0)

## 2018-03-23 LAB — PSA: PSA: 0.9 ng/mL (ref <=4.0)

## 2018-03-23 LAB — VITAMIN D 25 HYDROXY (VIT D DEFICIENCY, FRACTURES): Vit D, 25-Hydroxy: 124 ng/mL — ABNORMAL HIGH (ref 30–100)

## 2018-03-26 ENCOUNTER — Encounter: Payer: Self-pay | Admitting: *Deleted

## 2018-04-13 ENCOUNTER — Encounter: Payer: Self-pay | Admitting: Endocrinology

## 2018-04-13 ENCOUNTER — Ambulatory Visit: Payer: Medicare Other | Admitting: Endocrinology

## 2018-04-13 VITALS — BP 140/86 | HR 76 | Ht 71.0 in | Wt 227.0 lb

## 2018-04-13 DIAGNOSIS — E119 Type 2 diabetes mellitus without complications: Secondary | ICD-10-CM | POA: Diagnosis not present

## 2018-04-13 DIAGNOSIS — Z794 Long term (current) use of insulin: Secondary | ICD-10-CM

## 2018-04-13 MED ORDER — HUMULIN 70/30 (70-30) 100 UNIT/ML ~~LOC~~ SUSP: SUBCUTANEOUS | Status: DC

## 2018-04-13 NOTE — Progress Notes (Signed)
Subjective:    Patient ID: Gregory Hatfield, male    DOB: [birth year], 84 y.o.   MRN: [id number]  HPI Pt returns for f/u of diabetes mellitus: DM type: Insulin-requiring type 2 Dx'ed: 2841 Complications: renal insuff, retinopathy, CAD, and CHF.   Therapy: insulin since 2003 DKA: never Severe hypoglycemia: never.   Pancreatitis: never. Other: he is on a BID insulin regimen, due to noncompliance with multiple daily injections.   Interval history: He is in [facility] assist living, since he fell at home, and fx right arm.  He is not sure how long he expects to be there. Staff there gives insulin, and checks cbg's.    Med list is reviewed.  She takes 70/30 insulin, 50 units with breakfast, and 25 units with supper.  Out staff has called [name], and requested cbg record.  It varies from 56-219. There is no trend throughout the day. Past Medical History:  Diagnosis Date  . Bradycardia 10/06/2016  . CHF (congestive heart failure) ([place])   . CKD stage 4 due to type 2 diabetes mellitus ([place]) 06/03/2015  . Dyslipidemia associated with type 2 diabetes mellitus ([place]) 05/05/2015  . Essential hypertension 05/05/2015  . IDDM (insulin dependent diabetes mellitus) ([place])   . SDAT (senile dementia of Alzheimer's type) 06/16/2013  . Uncontrolled insulin-dependent diabetes mellitus with renal manifestation ([place]) 05/05/2015    Past Surgical History:  Procedure Laterality Date  . CARDIAC CATHETERIZATION  04/2004   normal r-sided pressure, mild pulm HTN  . CARPAL TUNNEL RELEASE Right   . CERVICAL SPINE SURGERY  1990's  . LUMBAR SPINE SURGERY  2003  . PROSTATE SURGERY  10/2010   transurethral resection   . TOTAL HIP ARTHROPLASTY Left 01/17/2014   Procedure: LEFT TOTAL HIP ARTHROPLASTY;  Surgeon: Yvette Rack., MD;  Location: [facility];  Service: Orthopedics;  Laterality: Left;  . TRANSTHORACIC ECHOCARDIOGRAM  05/2006   EF 60-70%; mild calcif of MV; mild MV regurg; LA mildly dilated  . TRANSURETHRAL  RESECTION OF PROSTATE N/A 08/22/2014   Procedure: TRANSURETHRAL RESECTION OF THE PROSTATE (TURP);  Surgeon: Marcia Brash, MD;  Location: [facility];  Service: Urology;  Laterality: N/A;    Social History   Socioeconomic History  . Marital status: Widowed    Spouse name: Not on file  . Number of children: 5  . Years of education: master's  . Highest education level: Not on file  Occupational History  . Occupation: Therapist, music: RETIRED  Social Needs  . Financial resource strain: Not on file  . Food insecurity:    Worry: Not on file    Inability: Not on file  . Transportation needs:    Medical: Not on file    Non-medical: Not on file  Tobacco Use  . Smoking status: Former Smoker    Last attempt to quit: 06/21/1983    Years since quitting: 34.8  . Smokeless tobacco: Never Used  Substance and Sexual Activity  . Alcohol use: No  . Drug use: No  . Sexual activity: Never  Lifestyle  . Physical activity:    Days per week: Not on file    Minutes per session: Not on file  . Stress: Not on file  Relationships  . Social connections:    Talks on phone: Not on file    Gets together: Not on file    Attends religious service: Not on file    Active member of club or organization: Not on file  Attends meetings of clubs or organizations: Not on file    Relationship status: Not on file  . Intimate partner violence:    Fear of current or ex partner: Not on file    Emotionally abused: Not on file    Physically abused: Not on file    Forced sexual activity: Not on file  Other Topics Concern  . Not on file  Social History Narrative  . Not on file    Current Outpatient Medications on File Prior to Visit  Medication Sig Dispense Refill  . amLODipine (NORVASC) 10 MG tablet Take 1 tablet (10 mg total) by mouth daily. 30 tablet 0  . aspirin 81 MG tablet Take 81 mg by mouth daily.    . carvedilol (COREG) 6.25 MG tablet TAKE 1 TABLET BY MOUTH TWICE DAILY WITH A MEAL. 180  tablet 0  . Cholecalciferol (VITAMIN D3) 5000 UNITS CAPS Take 5,000 Units by mouth 2 (two) times daily.     . cloNIDine (CATAPRES - DOSED IN MG/24 HR) 0.2 mg/24hr patch Place 1 patch (0.2 mg total) onto the skin once a week. 4 patch 12  . diclofenac sodium (VOLTAREN) 1 % GEL     . divalproex (DEPAKOTE SPRINKLE) 125 MG capsule     . furosemide (LASIX) 20 MG tablet Take 1 tablet (20 mg total) by mouth daily. 30 tablet 11  . latanoprost (XALATAN) 0.005 % ophthalmic solution     . Multiple Vitamin (MULTIVITAMIN) tablet Take 1 tablet by mouth daily.      Marland Kitchen MYRBETRIQ 50 MG TB24 tablet Take 50 mg by mouth daily.     Marland Kitchen OLANZapine (ZYPREXA) 5 MG tablet Take 1 tablet (5 mg total) by mouth at bedtime. 30 tablet 0  . Probiotic Product (PROBIOTIC DAILY PO) Take 1 tablet by mouth daily.     Marland Kitchen SIMBRINZA 1-0.2 % SUSP Place 1 drop into both eyes 3 (three) times daily.    . B-D UF III MINI PEN NEEDLES 31G X 5 MM MISC USE FOR INJECTIONS AS DIRECTED 100 each 0  . feeding supplement, ENSURE ENLIVE, (ENSURE ENLIVE) LIQD Take 237 mLs by mouth 2 (two) times daily between meals. 237 mL 12  . geriatric multivitamins-minerals (ELDERTONIC/GEVRABON) LIQD Take 15 mLs by mouth daily.    . haloperidol (HALDOL) 2 MG tablet Take 1 tablet (2 mg total) by mouth every 4 (four) hours as needed for agitation. 30 tablet 0  . hydrALAZINE (APRESOLINE) 100 MG tablet Take 1 tablet (100 mg total) by mouth 3 (three) times daily. KEEP OV. 90 tablet 3  . LEVEMIR FLEXTOUCH 100 UNIT/ML Pen INJECT 50 UNITS IN THE AM AND 25 UNITS IN THE PM 15 mL 0  . LUMIGAN 0.01 % SOLN Place 1 drop into the right eye at bedtime.     . ondansetron (ZOFRAN) 4 MG tablet Take 1 tablet (4 mg total) by mouth daily as needed for nausea or vomiting. (Patient not taking: Reported on 03/22/2018) 30 tablet 1  . ONE TOUCH ULTRA TEST test strip CHECK BLOOD SUGAR 6 TIMES A DAY. 100 each 6  . senna-docusate (SENOKOT-S) 8.6-50 MG tablet Take 1 tablet by mouth at bedtime.  (Patient not taking: Reported on 04/13/2018)     No current facility-administered medications on file prior to visit.     Allergies  Allergen Reactions  . Ace Inhibitors Other (See Comments) and Cough    Chronic cough  . Demerol Other (See Comments)    Hard to wake up  .  Cosopt [Dorzolamide Hcl-Timolol Mal] Other (See Comments)    Cause heart rate to drop  . Flomax [Tamsulosin Hcl] Other (See Comments)    Low BP  . Morphine And Related Other (See Comments)    hallucinations  . Oxytrol [Oxybutynin] Rash    Family History  Problem Relation Age of Onset  . Diabetes Mother   . Hypertension Mother   . Prostate cancer Father   . Heart disease Father   . Diabetes Brother   . Cancer Brother   . Heart disease Brother   . Diabetes Sister   . Heart disease Sister   . Hyperlipidemia Child   . Hypertension Child   . Diabetes Child   . Heart attack Child   . Cancer Child     BP 140/86   Pulse 76   Ht 5\' 11"  (1.803 m)   Wt 227 lb (103 kg)   SpO2 95%   BMI 31.66 kg/m    Review of Systems Denies LOC    Objective:   Physical Exam VITAL SIGNS:  See vs page.  GENERAL: no distress. In wheelchair.   Pulses: foot pulses are intact bilaterally.   MSK: no deformity of the feet or ankles.  CV: 2+ bilat edema of the legs Skin:  no ulcer on the feet or ankles.  normal temp on the feet and ankles. There is severe hyperpigmentation and scaling on the legs and feet.   Neuro: sensation is intact to touch on the feet and ankles.  Ext: There is bilateral onychomycosis of the toenails.    Lab Results  Component Value Date   HGBA1C 9.1 (H) 03/22/2018   Lab Results  Component Value Date   CREATININE 1.50 (H) 03/22/2018   BUN 18 03/22/2018   NA 143 03/22/2018   K 4.0 03/22/2018   CL 107 03/22/2018   CO2 28 03/22/2018       Assessment & Plan:  Insulin-requiring type 2 DM, with DR.  Renal failure: this increases the risk for hypoglycemia Frail elderly status: he is not a  candidate for aggressive glycemic control.  Patient Instructions  Please check resident's blood sugar 4 times a day--before the 3 meals, and at bedtime.  also check if you have symptoms of your blood sugar being too high or too low.  please keep a record of the readings and send it with resident to his appointments here.  please call us sooner for cbg below 70.   Please reduce the 70/30 insulin to 45 units with breakfast, and 20 units with supper.   On this type of insulin schedule, resident should eat meals on a regular schedule.  If a meal is missed or significantly delayed, your blood sugar could go low.  Please come back for a follow-up appointment in 3 months.

## 2018-04-13 NOTE — Patient Instructions (Addendum)
Please check resident's blood sugar 4 times a day--before the 3 meals, and at bedtime.  also check if you have symptoms of your blood sugar being too high or too low.  please keep a record of the readings and send it with resident to his appointments here.  please call us sooner for cbg below 70.   Please reduce the 70/30 insulin to 45 units with breakfast, and 20 units with supper.   On this type of insulin schedule, resident should eat meals on a regular schedule.  If a meal is missed or significantly delayed, your blood sugar could go low.  Please come back for a follow-up appointment in 3 months.

## 2018-04-19 ENCOUNTER — Observation Stay (HOSPITAL_COMMUNITY)
Admission: EM | Admit: 2018-04-19 | Discharge: 2018-04-21 | Disposition: A | Discharge status: Home / Self Care | Payer: Medicare Other | Attending: Internal Medicine | Admitting: Internal Medicine

## 2018-04-19 ENCOUNTER — Emergency Department (HOSPITAL_COMMUNITY): Payer: Medicare Other

## 2018-04-19 ENCOUNTER — Encounter (HOSPITAL_COMMUNITY): Payer: Self-pay | Admitting: Internal Medicine

## 2018-04-19 DIAGNOSIS — E876 Hypokalemia: Secondary | ICD-10-CM | POA: Diagnosis not present

## 2018-04-19 DIAGNOSIS — Z794 Long term (current) use of insulin: Secondary | ICD-10-CM | POA: Diagnosis not present

## 2018-04-19 DIAGNOSIS — Z885 Allergy status to narcotic agent status: Secondary | ICD-10-CM | POA: Insufficient documentation

## 2018-04-19 DIAGNOSIS — E1122 Type 2 diabetes mellitus with diabetic chronic kidney disease: Secondary | ICD-10-CM | POA: Diagnosis not present

## 2018-04-19 DIAGNOSIS — Z833 Family history of diabetes mellitus: Secondary | ICD-10-CM | POA: Insufficient documentation

## 2018-04-19 DIAGNOSIS — Z9889 Other specified postprocedural states: Secondary | ICD-10-CM | POA: Diagnosis not present

## 2018-04-19 DIAGNOSIS — Z96642 Presence of left artificial hip joint: Secondary | ICD-10-CM | POA: Diagnosis not present

## 2018-04-19 DIAGNOSIS — Z7982 Long term (current) use of aspirin: Secondary | ICD-10-CM | POA: Diagnosis not present

## 2018-04-19 DIAGNOSIS — I272 Pulmonary hypertension, unspecified: Secondary | ICD-10-CM | POA: Insufficient documentation

## 2018-04-19 DIAGNOSIS — Z981 Arthrodesis status: Secondary | ICD-10-CM | POA: Diagnosis not present

## 2018-04-19 DIAGNOSIS — E785 Hyperlipidemia, unspecified: Secondary | ICD-10-CM | POA: Insufficient documentation

## 2018-04-19 DIAGNOSIS — E119 Type 2 diabetes mellitus without complications: Secondary | ICD-10-CM

## 2018-04-19 DIAGNOSIS — J90 Pleural effusion, not elsewhere classified: Secondary | ICD-10-CM | POA: Diagnosis not present

## 2018-04-19 DIAGNOSIS — I130 Hypertensive heart and chronic kidney disease with heart failure and stage 1 through stage 4 chronic kidney disease, or unspecified chronic kidney disease: Secondary | ICD-10-CM | POA: Insufficient documentation

## 2018-04-19 DIAGNOSIS — Z87891 Personal history of nicotine dependence: Secondary | ICD-10-CM | POA: Diagnosis not present

## 2018-04-19 DIAGNOSIS — I5032 Chronic diastolic (congestive) heart failure: Secondary | ICD-10-CM | POA: Diagnosis present

## 2018-04-19 DIAGNOSIS — Z79899 Other long term (current) drug therapy: Secondary | ICD-10-CM | POA: Diagnosis not present

## 2018-04-19 DIAGNOSIS — G301 Alzheimer's disease with late onset: Secondary | ICD-10-CM | POA: Insufficient documentation

## 2018-04-19 DIAGNOSIS — Z888 Allergy status to other drugs, medicaments and biological substances status: Secondary | ICD-10-CM | POA: Insufficient documentation

## 2018-04-19 DIAGNOSIS — R55 Syncope and collapse: Principal | ICD-10-CM | POA: Diagnosis present

## 2018-04-19 DIAGNOSIS — I447 Left bundle-branch block, unspecified: Secondary | ICD-10-CM | POA: Diagnosis not present

## 2018-04-19 DIAGNOSIS — I10 Essential (primary) hypertension: Secondary | ICD-10-CM | POA: Diagnosis present

## 2018-04-19 DIAGNOSIS — N184 Chronic kidney disease, stage 4 (severe): Secondary | ICD-10-CM | POA: Insufficient documentation

## 2018-04-19 DIAGNOSIS — E8770 Fluid overload, unspecified: Secondary | ICD-10-CM

## 2018-04-19 DIAGNOSIS — F0280 Dementia in other diseases classified elsewhere without behavioral disturbance: Secondary | ICD-10-CM | POA: Diagnosis not present

## 2018-04-19 DIAGNOSIS — Z8249 Family history of ischemic heart disease and other diseases of the circulatory system: Secondary | ICD-10-CM | POA: Diagnosis not present

## 2018-04-19 DIAGNOSIS — Z7189 Other specified counseling: Secondary | ICD-10-CM

## 2018-04-19 LAB — I-STAT TROPONIN, ED: Troponin i, poc: 0.03 ng/mL (ref 0.00–0.08)

## 2018-04-19 LAB — CBC WITH DIFFERENTIAL/PLATELET
ABS IMMATURE GRANULOCYTES: 0.01 10*3/uL (ref 0.00–0.07)
BASOS ABS: 0.1 10*3/uL (ref 0.0–0.1)
Basophils Relative: 1 %
Eosinophils Absolute: 0.2 10*3/uL (ref 0.0–0.5)
Eosinophils Relative: 4 %
HCT: 38.6 % — ABNORMAL LOW (ref 39.0–52.0)
HEMOGLOBIN: 11.5 g/dL — AB (ref 13.0–17.0)
Immature Granulocytes: 0 %
LYMPHS ABS: 2.2 10*3/uL (ref 0.7–4.0)
LYMPHS PCT: 38 %
MCH: 27.3 pg (ref 26.0–34.0)
MCHC: 29.8 g/dL — ABNORMAL LOW (ref 30.0–36.0)
MCV: 91.7 fL (ref 80.0–100.0)
Monocytes Absolute: 0.6 10*3/uL (ref 0.1–1.0)
Monocytes Relative: 9 %
NEUTROS ABS: 2.8 10*3/uL (ref 1.7–7.7)
Neutrophils Relative %: 48 %
Platelets: 257 10*3/uL (ref 150–400)
RBC: 4.21 MIL/uL — AB (ref 4.22–5.81)
RDW: 14.9 % (ref 11.5–15.5)
WBC: 5.9 10*3/uL (ref 4.0–10.5)
nRBC: 0 % (ref 0.0–0.2)

## 2018-04-19 LAB — BASIC METABOLIC PANEL
Anion gap: 14 (ref 5–15)
BUN: 17 mg/dL (ref 8–23)
CHLORIDE: 104 mmol/L (ref 98–111)
CO2: 25 mmol/L (ref 22–32)
Calcium: 9.5 mg/dL (ref 8.9–10.3)
Creatinine, Ser: 1.87 mg/dL — ABNORMAL HIGH (ref 0.61–1.24)
GFR calc Af Amer: 35 mL/min — ABNORMAL LOW (ref >60)
GFR calc non Af Amer: 30 mL/min — ABNORMAL LOW (ref >60)
Glucose, Bld: 135 mg/dL — ABNORMAL HIGH (ref 70–99)
POTASSIUM: 3.4 mmol/L — AB (ref 3.5–5.1)
SODIUM: 143 mmol/L (ref 135–145)

## 2018-04-19 LAB — CBG MONITORING, ED: GLUCOSE-CAPILLARY: 152 mg/dL — AB (ref 70–99)

## 2018-04-19 LAB — BRAIN NATRIURETIC PEPTIDE: B Natriuretic Peptide: 267.6 pg/mL — ABNORMAL HIGH (ref 0.0–100.0)

## 2018-04-19 LAB — TROPONIN I

## 2018-04-19 LAB — GLUCOSE, CAPILLARY: Glucose-Capillary: 114 mg/dL — ABNORMAL HIGH (ref 70–99)

## 2018-04-19 MED ORDER — CLONIDINE HCL 0.2 MG/24HR TD PTWK
0.2000 mg | MEDICATED_PATCH | TRANSDERMAL | Status: DC
  Administered 2018-04-20: 0.2 mg via TRANSDERMAL
  Filled 2018-04-19: qty 1

## 2018-04-19 MED ORDER — DIVALPROEX SODIUM 125 MG PO CSDR
125.0000 mg | DELAYED_RELEASE_CAPSULE | Freq: Two times a day (BID) | ORAL | Status: DC
  Administered 2018-04-19 – 2018-04-21 (×4): 125 mg via ORAL
  Filled 2018-04-19 (×4): qty 1

## 2018-04-19 MED ORDER — OLANZAPINE 5 MG PO TABS
5.0000 mg | ORAL_TABLET | Freq: Every day | ORAL | Status: DC
  Administered 2018-04-19 – 2018-04-20 (×2): 5 mg via ORAL
  Filled 2018-04-19 (×2): qty 1

## 2018-04-19 MED ORDER — CARVEDILOL 6.25 MG PO TABS
6.2500 mg | ORAL_TABLET | Freq: Two times a day (BID) | ORAL | Status: DC
  Administered 2018-04-20 – 2018-04-21 (×3): 6.25 mg via ORAL
  Filled 2018-04-19 (×3): qty 1

## 2018-04-19 MED ORDER — HEPARIN SODIUM (PORCINE) 5000 UNIT/ML IJ SOLN
5000.0000 [IU] | Freq: Three times a day (TID) | INTRAMUSCULAR | Status: DC
  Administered 2018-04-19 – 2018-04-21 (×6): 5000 [IU] via SUBCUTANEOUS
  Filled 2018-04-19 (×6): qty 1

## 2018-04-19 MED ORDER — AMLODIPINE BESYLATE 10 MG PO TABS
10.0000 mg | ORAL_TABLET | Freq: Every day | ORAL | Status: DC
  Administered 2018-04-20 – 2018-04-21 (×2): 10 mg via ORAL
  Filled 2018-04-19 (×2): qty 1

## 2018-04-19 MED ORDER — ASPIRIN 81 MG PO CHEW
81.0000 mg | CHEWABLE_TABLET | Freq: Every day | ORAL | Status: DC
  Administered 2018-04-20 – 2018-04-21 (×2): 81 mg via ORAL
  Filled 2018-04-19 (×2): qty 1

## 2018-04-19 MED ORDER — ACETAMINOPHEN 650 MG RE SUPP: 650.0000 mg | Freq: Four times a day (QID) | RECTAL | Status: DC | PRN

## 2018-04-19 MED ORDER — POTASSIUM CHLORIDE 20 MEQ/15ML (10%) PO SOLN
40.0000 meq | Freq: Once | ORAL | Status: AC
  Administered 2018-04-19: 40 meq via ORAL
  Filled 2018-04-19: qty 30

## 2018-04-19 MED ORDER — FUROSEMIDE 40 MG PO TABS
40.0000 mg | ORAL_TABLET | Freq: Every day | ORAL | Status: DC
  Administered 2018-04-20: 40 mg via ORAL
  Filled 2018-04-19: qty 1

## 2018-04-19 MED ORDER — FUROSEMIDE 10 MG/ML IJ SOLN
40.0000 mg | Freq: Once | INTRAMUSCULAR | Status: AC
  Administered 2018-04-19: 40 mg via INTRAVENOUS
  Filled 2018-04-19: qty 4

## 2018-04-19 MED ORDER — ACETAMINOPHEN 325 MG PO TABS: 650.0000 mg | ORAL_TABLET | Freq: Four times a day (QID) | ORAL | Status: DC | PRN

## 2018-04-19 MED ORDER — SODIUM CHLORIDE 0.9% FLUSH
3.0000 mL | Freq: Two times a day (BID) | INTRAVENOUS | Status: DC
  Administered 2018-04-19 – 2018-04-21 (×4): 3 mL via INTRAVENOUS

## 2018-04-19 MED ORDER — INSULIN ASPART 100 UNIT/ML ~~LOC~~ SOLN
0.0000 [IU] | Freq: Three times a day (TID) | SUBCUTANEOUS | Status: DC
  Administered 2018-04-20: 2 [IU] via SUBCUTANEOUS
  Administered 2018-04-20: 1 [IU] via SUBCUTANEOUS
  Administered 2018-04-20 – 2018-04-21 (×2): 3 [IU] via SUBCUTANEOUS
  Administered 2018-04-21: 2 [IU] via SUBCUTANEOUS

## 2018-04-19 NOTE — ED Notes (Signed)
Patient transported to X-ray 

## 2018-04-19 NOTE — ED Triage Notes (Signed)
Pt here from [facility] after a syncopal episode. Nursing staff found him unresponsive in his wheelchair in the dining room with his head down and drooling. Pt became A/O when EMS moved him to the stretcher at facility. Does not remember anything prior to being transferred to the stretcher. Also reports increased swelling in bilateral lower extremities. Hx CHF. A/Ox4 at this time.

## 2018-04-19 NOTE — H&P (Signed)
History and Physical    Gregory Hatfield NWG:[id number] DOB: 1926/04/21 DOA: 04/19/2018  PCP: [name], MD  Patient coming from: [facility]  I have personally briefly reviewed patient's old medical records in [place]  Chief Complaint: Syncope  HPI: Gregory Hatfield is a 84 y.o. male with medical history significant of IDDM, Chronic dCHF, Alzheimer's dementia, HTN, HLD, and CKD4 who presents from his assisted living facility after a syncopal episode.  Patient was sitting his wheelchair waiting for dinner to be served when he suddenly passed out.  Per report given to ED provider, staff at the facility noted that he was slumped over, drooling, and difficult to arouse.  He had never lost pulse.  Blood pressure was reportedly very high.  EMS were called and he had improved by their arrival.  Patient states that the only thing he remembers is waking up in the ambulance.  He says he has been frequently falling asleep, but has not passed out like he did today.  He recalls feeling the urge to urinate prior to this episode, but denies any incontinence. He has also noticed some increased shortness of breath and swelling his legs and abdomen.  He denies any associated chest pain, palpitations, nausea, vomiting, headache, change in vision.  ED Course:  BP 168/75, pulse 66, RR 17, temp 97.78F, SPO2 91% on RA. CBG 152 K 3.4, Cr 1.87 (1.5 on 10/3), GFR 35 (47 on 10/3) WBC 5.9, Hgb 11.5, Plt 257 BNP 267.6 (552.9 three months ago) I-stat troponin 0.03  EKG showed sinus rhythm with frequent PVCs and LBBB Hospitalist service was consulted for admission for syncope and volume overload.  Review of Systems: As per HPI otherwise 10 point review of systems negative.    Past Medical History:  Diagnosis Date  . Bradycardia 10/06/2016  . CHF (congestive heart failure) ([place])   . CKD stage 4 due to type 2 diabetes mellitus ([place]) 06/03/2015  . Dyslipidemia associated with type 2 diabetes  mellitus ([place]) 05/05/2015  . Essential hypertension 05/05/2015  . IDDM (insulin dependent diabetes mellitus) ([place])   . SDAT (senile dementia of Alzheimer's type) 06/16/2013  . Uncontrolled insulin-dependent diabetes mellitus with renal manifestation ([place]) 05/05/2015    Past Surgical History:  Procedure Laterality Date  . CARDIAC CATHETERIZATION  04/2004   normal r-sided pressure, mild pulm HTN  . CARPAL TUNNEL RELEASE Right   . CERVICAL SPINE SURGERY  1990's  . LUMBAR SPINE SURGERY  2003  . PROSTATE SURGERY  10/2010   transurethral resection   . TOTAL HIP ARTHROPLASTY Left 01/17/2014   Procedure: LEFT TOTAL HIP ARTHROPLASTY;  Surgeon: Yvette Rack., MD;  Location: [facility];  Service: Orthopedics;  Laterality: Left;  . TRANSTHORACIC ECHOCARDIOGRAM  05/2006   EF 60-70%; mild calcif of MV; mild MV regurg; LA mildly dilated  . TRANSURETHRAL RESECTION OF PROSTATE N/A 08/22/2014   Procedure: TRANSURETHRAL RESECTION OF THE PROSTATE (TURP);  Surgeon: Marcia Brash, MD;  Location: [facility];  Service: Urology;  Laterality: N/A;     reports that he quit smoking about 34 years ago. He has never used smokeless tobacco. He reports that he does not drink alcohol or use drugs.  Allergies  Allergen Reactions  . Ace Inhibitors Other (See Comments) and Cough    Chronic cough  . Demerol Other (See Comments)    Hard to wake up  . Cosopt [Dorzolamide Hcl-Timolol Mal] Other (See Comments)    Cause heart rate to drop  . Flomax [  Tamsulosin Hcl] Other (See Comments)    Low BP  . Morphine And Related Other (See Comments)    hallucinations  . Oxytrol [Oxybutynin] Rash    Family History  Problem Relation Age of Onset  . Diabetes Mother   . Hypertension Mother   . Prostate cancer Father   . Heart disease Father   . Diabetes Brother   . Cancer Brother   . Heart disease Brother   . Diabetes Sister   . Heart disease Sister   . Hyperlipidemia Child   . Hypertension Child   . Diabetes Child   .  Heart attack Child   . Cancer Child      Prior to Admission medications   Medication Sig Start Date End Date Taking? Authorizing Provider  amLODipine (NORVASC) 10 MG tablet Take 1 tablet (10 mg total) by mouth daily. 01/21/18   Shelly Coss, MD  aspirin 81 MG tablet Take 81 mg by mouth daily.    [provider]  B-D UF III MINI PEN NEEDLES 31G X 5 MM MISC USE FOR INJECTIONS AS DIRECTED 12/27/17   Renato Shin, MD  carvedilol (COREG) 6.25 MG tablet TAKE 1 TABLET BY MOUTH TWICE DAILY WITH A MEAL. 01/20/18   [name], MD  Cholecalciferol (VITAMIN D3) 5000 UNITS CAPS Take 5,000 Units by mouth 2 (two) times daily.     [provider]  cloNIDine (CATAPRES - DOSED IN MG/24 HR) 0.2 mg/24hr patch Place 1 patch (0.2 mg total) onto the skin once a week. 02/16/18   Georgette Shell, MD  diclofenac sodium (VOLTAREN) 1 % GEL  04/09/18   [provider]  divalproex (DEPAKOTE SPRINKLE) 125 MG capsule  04/02/18   [provider]  feeding supplement, ENSURE ENLIVE, (ENSURE ENLIVE) LIQD Take 237 mLs by mouth 2 (two) times daily between meals. 02/12/18   Georgette Shell, MD  furosemide (LASIX) 20 MG tablet Take 1 tablet (20 mg total) by mouth daily. 02/12/18 02/12/19  Georgette Shell, MD  geriatric multivitamins-minerals (ELDERTONIC/GEVRABON) LIQD Take 15 mLs by mouth daily. 02/13/18   Georgette Shell, MD  haloperidol (HALDOL) 2 MG tablet Take 1 tablet (2 mg total) by mouth every 4 (four) hours as needed for agitation. 02/12/18   Georgette Shell, MD  HUMULIN 70/30 (70-30) 100 UNIT/ML injection 45 units with breakfast, and 20 units with supper. 04/13/18   Renato Shin, MD  hydrALAZINE (APRESOLINE) 100 MG tablet Take 1 tablet (100 mg total) by mouth 3 (three) times daily. KEEP OV. 12/27/17   Almyra Deforest, PA  latanoprost (XALATAN) 0.005 % ophthalmic solution  04/02/18   [provider]  LEVEMIR FLEXTOUCH 100 UNIT/ML Pen INJECT 50 UNITS IN THE AM AND  25 UNITS IN THE PM 02/13/18   Renato Shin, MD  LUMIGAN 0.01 % SOLN Place 1 drop into the right eye at bedtime.  03/29/13   [provider]  Multiple Vitamin (MULTIVITAMIN) tablet Take 1 tablet by mouth daily.      [provider]  MYRBETRIQ 50 MG TB24 tablet Take 50 mg by mouth daily.  12/10/14   [provider]  OLANZapine (ZYPREXA) 5 MG tablet Take 1 tablet (5 mg total) by mouth at bedtime. 02/12/18   Georgette Shell, MD  ondansetron (ZOFRAN) 4 MG tablet Take 1 tablet (4 mg total) by mouth daily as needed for nausea or vomiting. Patient not taking: Reported on 03/22/2018 02/12/18 02/12/19  Georgette Shell, MD  ONE TOUCH ULTRA TEST  test strip CHECK BLOOD SUGAR 6 TIMES A DAY. 02/22/18   Renato Shin, MD  Probiotic Product (PROBIOTIC DAILY PO) Take 1 tablet by mouth daily.     [provider]  senna-docusate (SENOKOT-S) 8.6-50 MG tablet Take 1 tablet by mouth at bedtime. Patient not taking: Reported on 04/13/2018 05/09/15   Eugenie Filler, MD  [facility] 1-0.2 % SUSP Place 1 drop into both eyes 3 (three) times daily. 02/28/13   [provider]    Physical Exam: Vitals:   04/19/18 2045 04/19/18 2130 04/19/18 2241 04/20/18 0031  BP: (!) 192/86 (!) 181/84 (!) 168/65 (!) 178/87  Pulse: 62 65 68 61  Resp: 13 (!) 21 18 18   Temp:   97.9 F (36.6 C)   TempSrc:   Oral   SpO2: 94% 95% 96% 94%  Weight:      Height:        Constitutional: Elderly man, resting in bed in no acute distress Eyes: PERRL, lids and conjunctivae normal ENMT: Mucous membranes are moist. Posterior pharynx clear of any exudate or lesions. Neck: normal, supple, no masses, no thyromegaly Respiratory: Bibasilar crackles.  Normal respiratory effort. No accessory muscle use.  Cardiovascular: Regular rate and rhythm, no murmurs / rubs / gallops. +2 pitting edema bilateral lower extremities. Abdomen: Distended, no tenderness, no masses palpated. Bowel sounds positive.    Musculoskeletal: no clubbing / cyanosis. No joint deformity upper and lower extremities. Skin: no rashes, lesions, ulcers. No induration Neurologic: CN 2-12 grossly intact. Sensation intact. Strength 5/5 in all 4.  Psychiatric: Normal judgment and insight. Alert and oriented to person and place, initially unsure of year but was able to tell me after a couple tries. Normal mood.     Labs on Admission: I have personally reviewed following labs and imaging studies  CBC: Recent Labs  Lab 04/19/18 1943  WBC 5.9  NEUTROABS 2.8  HGB 11.5*  HCT 38.6*  MCV 91.7  PLT 993   Basic Metabolic Panel: Recent Labs  Lab 04/19/18 1943  NA 143  K 3.4*  CL 104  CO2 25  GLUCOSE 135*  BUN 17  CREATININE 1.87*  CALCIUM 9.5   GFR: Estimated Creatinine Clearance: 31.3 mL/min (A) (by C-G formula based on SCr of 1.87 mg/dL (H)). Liver Function Tests: No results for input(s): AST, ALT, ALKPHOS, BILITOT, PROT, ALBUMIN in the last 168 hours. No results for input(s): LIPASE, AMYLASE in the last 168 hours. No results for input(s): AMMONIA in the last 168 hours. Coagulation Profile: No results for input(s): INR, PROTIME in the last 168 hours. Cardiac Enzymes: Recent Labs  Lab 04/19/18 2215  TROPONINI <0.03   BNP (last 3 results) No results for input(s): PROBNP in the last 8760 hours. HbA1C: No results for input(s): HGBA1C in the last 72 hours. CBG: Recent Labs  Lab 04/19/18 1839 04/19/18 2255  GLUCAP 152* 114*   Lipid Profile: No results for input(s): CHOL, HDL, LDLCALC, TRIG, CHOLHDL, LDLDIRECT in the last 72 hours. Thyroid Function Tests: No results for input(s): TSH, T4TOTAL, FREET4, T3FREE, THYROIDAB in the last 72 hours. Anemia Panel: No results for input(s): VITAMINB12, FOLATE, FERRITIN, TIBC, IRON, RETICCTPCT in the last 72 hours. Urine analysis:    Component Value Date/Time   COLORURINE YELLOW 03/22/2018 [place] 03/22/2018 1204   LABSPEC 1.014 03/22/2018  1204   PHURINE 6.5 03/22/2018 [place] 03/22/2018 1204   HGBUR NEGATIVE 03/22/2018 [place] 02/09/2018 0212   Benjamin Stain  NEGATIVE 03/22/2018 1204   PROTEINUR 2+ (A) 03/22/2018 1204   UROBILINOGEN 0.2 05/05/2015 1023   NITRITE NEGATIVE 03/22/2018 1204   LEUKOCYTESUR NEGATIVE 03/22/2018 1204    Radiological Exams on Admission: Dg Chest 2 View  Result Date: 04/19/2018 CLINICAL DATA:  Syncopal episodes. EXAM: CHEST - 2 VIEW COMPARISON:  02/09/2018 FINDINGS: The heart is enlarged but stable. There is tortuosity of the thoracic aorta. Moderate vascular congestion and possible interstitial pulmonary edema. Suspect small bilateral pleural effusions. Bibasilar atelectasis. IMPRESSION: Findings suggest CHF with small effusions and bibasilar atelectasis. Electronically Signed   By: Marijo Sanes M.D.   On: 04/19/2018 19:14    EKG: Independently reviewed.  Sinus rhythm with LBBB frequent PVCs.  Frequent PVCs are new compared to prior.  Assessment/Plan Principal Problem:   Syncope Active Problems:   SDAT (senile dementia of Alzheimer's type) ([place])   Insulin-requiring or dependent type II diabetes mellitus ([place])   Essential hypertension   CKD stage 4 due to type 2 diabetes mellitus ([facility])   Chronic diastolic CHF (congestive heart failure) ([facility])   Hypokalemia   Gregory Hatfield is a 84 y.o. male with medical history significant of IDDM, Chronic dCHF, Alzheimer's dementia, HTN, HLD, and CKD4 who presents from his assisted living facility after a syncopal episode.   Syncope: Patient with syncopal episode while at rest at his ALF.  Potentially cardiogenic given frequent PVCs seen on his EKG.  Transient hypotension is also possibility however he has been hypertensive here.  Given his urinary complaints, micturition syncope is also a possibility.  Per report, was not hypoglycemic and he did not lose pulse. -admit to telemetry -Trend troponins -If continues to have  frequent PVCs on telemetry, may benefit from increasing home Coreg if heart rate allows  Chronic diastolic CHF: Grade 2 diastolic dysfunction and EF 60-65% by TTE on 01/17/2018.  He states his home oral Lasix was recently increased from 20 mg to 40 mg daily due to worsening lower extremity edema.  He does appear to be volume overloaded, however does not seem to be affecting his breathing much at this point as he is laying nearly flat during my exam. -IV Lasix 40 once -Resume home Lasix 40 mg p.o. tomorrow although may need further IV diuresis pending reassessment in volume status and renal function in the morning -Continue home Coreg 6.25 twice daily  Type 2 diabetes: Takes Humulin 45 units with breakfast and 20 units with supper at home.  Most recent CBG 114. -SSI only for now  Hypertension: Takes amlodipine 10 mg daily, Coreg 6.25 mg twice daily, and clonidine 0.2 mg patch outpatient. Per family, was on hydralazine which was recently discontinued. His blood pressures are elevated here. Did not see clonidine patch in place, may be rebound hypertension. -Continue home amlodipine 10 mg daily, Coreg 6.25 mg twice daily, and clonidine 0.2 mg patch   CKD stage IV: Cr 1.87 (1.5 on 10/3), GFR 35 (47 on 10/3).  Potentially secondary to volume overload.  Given IV Lasix once and repeat bmet in a.m. as above.  Hypokalemia: K 3.4, repleting  Alzheimer's dementia: On my exam he is alert and oriented and providing his own history although he has tangential and repetitive speech.  On Zyprexa and Depakote at home.  Appears to be on Zyprexa for combative agitation, however unsure indication for Depakote. -We will continue both for now   DVT prophylaxis: Heparin subq Code Status: Full code Family Communication: Discussed with [facility] POA daughter Velva Harman by phone, and  with patient's wife and other daughter at bedside Disposition Plan: Anticipate discharge back to ALF in 1-2 days Consults called: None Admission  status: Observation   Zada Finders MD [facility] Pager [phone]  If 7PM-7AM, please contact night-coverage [email or web address] Password [email or web address]  04/20/2018, 1:16 AM

## 2018-04-19 NOTE — ED Provider Notes (Signed)
[facility] EMERGENCY DEPARTMENT Provider Note   CSN: [id number] Arrival date & time: 04/19/18  1757     History   Chief Complaint Chief Complaint  Patient presents with  . Loss of Consciousness  . Facial Swelling    HPI Gregory Hatfield is a 84 y.o. male.  He is brought in from [facility] after a syncopal event.  Patient states he was sitting in the dining room and having a candy bar waiting for dinner and then he remembers the paramedics being around him.  Per EMS he was unresponsive in his wheelchair.  He regained consciousness when EMS put him on the stretcher currently he denies any complaints.  Michela Pitcher this is similar to a few months ago when he was admitted with syncope and congestive heart failure.  He does feel his abdomen is been a little more distended and is had some increased swelling in his lower extremities.  He denies any chest pain fever shortness of breath.  The history is provided by the patient.  Loss of Consciousness   This is a new problem. The current episode started less than 1 hour ago. The problem occurs rarely. The problem has been resolved. Length of episode of loss of consciousness: unknown. The problem is associated with inactivity. Pertinent negatives include abdominal pain, bowel incontinence, chest pain, fever, focal weakness, headaches, nausea, palpitations, seizures, slurred speech and vomiting. He has tried nothing for the symptoms. The treatment provided significant relief. His past medical history is significant for CAD and DM. His past medical history does not include CVA or seizures.    Past Medical History:  Diagnosis Date  . Bradycardia 10/06/2016  . CHF (congestive heart failure) ([place])   . CKD stage 4 due to type 2 diabetes mellitus ([place]) 06/03/2015  . Dyslipidemia associated with type 2 diabetes mellitus ([place]) 05/05/2015  . Essential hypertension 05/05/2015  . IDDM (insulin dependent diabetes mellitus) ([place])    . SDAT (senile dementia of Alzheimer's type) 06/16/2013  . Uncontrolled insulin-dependent diabetes mellitus with renal manifestation ([place]) 05/05/2015    Patient Active Problem List   Diagnosis Date Noted  . Acute lower UTI 02/09/2018  . Acute encephalopathy 02/09/2018  . UTI (urinary tract infection) 02/09/2018  . Metabolic encephalopathy 50/27/7412  . Pressure injury of skin 02/09/2018  . Peripheral edema   . Chronic diastolic CHF (congestive heart failure) ([place]) 01/16/2018  . Symptomatic bradycardia 10/06/2016  . Diabetes mellitus with complication ([place]) 87/86/7672  . Acute on chronic diastolic heart failure ([place]) 10/06/2016  . Hyperlipidemia 01/12/2016  . Prostate cancer ([place]) 10/12/2015  . Diabetic retinopathy ([place]) 10/12/2015  . Encounter for Medicare annual wellness exam 10/12/2015  . Open-angle glaucoma 07/06/2015  . CKD stage 4 due to type 2 diabetes mellitus ([place]) 06/03/2015  . Vitamin D deficiency 06/03/2015  . Medication management 06/03/2015  . Insulin-requiring or dependent type II diabetes mellitus ([place]) 05/05/2015  . Essential hypertension 05/05/2015  . SDAT (senile dementia of Alzheimer's type) ([place]) 06/16/2013    Past Surgical History:  Procedure Laterality Date  . CARDIAC CATHETERIZATION  04/2004   normal r-sided pressure, mild pulm HTN  . CARPAL TUNNEL RELEASE Right   . CERVICAL SPINE SURGERY  1990's  . LUMBAR SPINE SURGERY  2003  . PROSTATE SURGERY  10/2010   transurethral resection   . TOTAL HIP ARTHROPLASTY Left 01/17/2014   Procedure: LEFT TOTAL HIP ARTHROPLASTY;  Surgeon: Yvette Rack., MD;  Location: [facility];  Service: Orthopedics;  Laterality: Left;  . TRANSTHORACIC ECHOCARDIOGRAM  05/2006   EF 60-70%; mild calcif of MV; mild MV regurg; LA mildly dilated  . TRANSURETHRAL RESECTION OF PROSTATE N/A 08/22/2014   Procedure: TRANSURETHRAL RESECTION OF THE PROSTATE (TURP);  Surgeon: Marcia Brash, MD;  Location: [facility];  Service: Urology;  Laterality:  N/A;        Home Medications    Prior to Admission medications   Medication Sig Start Date End Date Taking? Authorizing Provider  amLODipine (NORVASC) 10 MG tablet Take 1 tablet (10 mg total) by mouth daily. 01/21/18   Shelly Coss, MD  aspirin 81 MG tablet Take 81 mg by mouth daily.    [provider]  B-D UF III MINI PEN NEEDLES 31G X 5 MM MISC USE FOR INJECTIONS AS DIRECTED 12/27/17   Renato Shin, MD  carvedilol (COREG) 6.25 MG tablet TAKE 1 TABLET BY MOUTH TWICE DAILY WITH A MEAL. 01/20/18   [name], MD  Cholecalciferol (VITAMIN D3) 5000 UNITS CAPS Take 5,000 Units by mouth 2 (two) times daily.     [provider]  cloNIDine (CATAPRES - DOSED IN MG/24 HR) 0.2 mg/24hr patch Place 1 patch (0.2 mg total) onto the skin once a week. 02/16/18   Georgette Shell, MD  diclofenac sodium (VOLTAREN) 1 % GEL  04/09/18   [provider]  divalproex (DEPAKOTE SPRINKLE) 125 MG capsule  04/02/18   [provider]  feeding supplement, ENSURE ENLIVE, (ENSURE ENLIVE) LIQD Take 237 mLs by mouth 2 (two) times daily between meals. 02/12/18   Georgette Shell, MD  furosemide (LASIX) 20 MG tablet Take 1 tablet (20 mg total) by mouth daily. 02/12/18 02/12/19  Georgette Shell, MD  geriatric multivitamins-minerals (ELDERTONIC/GEVRABON) LIQD Take 15 mLs by mouth daily. 02/13/18   Georgette Shell, MD  haloperidol (HALDOL) 2 MG tablet Take 1 tablet (2 mg total) by mouth every 4 (four) hours as needed for agitation. 02/12/18   Georgette Shell, MD  HUMULIN 70/30 (70-30) 100 UNIT/ML injection 45 units with breakfast, and 20 units with supper. 04/13/18   Renato Shin, MD  hydrALAZINE (APRESOLINE) 100 MG tablet Take 1 tablet (100 mg total) by mouth 3 (three) times daily. KEEP OV. 12/27/17   Almyra Deforest, PA  latanoprost (XALATAN) 0.005 % ophthalmic solution  04/02/18   [provider]  LEVEMIR FLEXTOUCH 100 UNIT/ML Pen INJECT 50 UNITS IN THE AM AND 25  UNITS IN THE PM 02/13/18   Renato Shin, MD  LUMIGAN 0.01 % SOLN Place 1 drop into the right eye at bedtime.  03/29/13   [provider]  Multiple Vitamin (MULTIVITAMIN) tablet Take 1 tablet by mouth daily.      [provider]  MYRBETRIQ 50 MG TB24 tablet Take 50 mg by mouth daily.  12/10/14   [provider]  OLANZapine (ZYPREXA) 5 MG tablet Take 1 tablet (5 mg total) by mouth at bedtime. 02/12/18   Georgette Shell, MD  ondansetron (ZOFRAN) 4 MG tablet Take 1 tablet (4 mg total) by mouth daily as needed for nausea or vomiting. Patient not taking: Reported on 03/22/2018 02/12/18 02/12/19  Georgette Shell, MD  ONE TOUCH ULTRA TEST test strip CHECK BLOOD SUGAR 6 TIMES A DAY. 02/22/18   Renato Shin, MD  Probiotic Product (PROBIOTIC DAILY PO) Take 1 tablet by mouth daily.     [provider]  senna-docusate (SENOKOT-S) 8.6-50 MG tablet Take 1 tablet by mouth at bedtime. Patient not taking: Reported  on 04/13/2018 05/09/15   Eugenie Filler, MD  SIMBRINZA 1-0.2 % SUSP Place 1 drop into both eyes 3 (three) times daily. 02/28/13   [provider]    Family History Family History  Problem Relation Age of Onset  . Diabetes Mother   . Hypertension Mother   . Prostate cancer Father   . Heart disease Father   . Diabetes Brother   . Cancer Brother   . Heart disease Brother   . Diabetes Sister   . Heart disease Sister   . Hyperlipidemia Child   . Hypertension Child   . Diabetes Child   . Heart attack Child   . Cancer Child     Social History Social History   Tobacco Use  . Smoking status: Former Smoker    Last attempt to quit: 06/21/1983    Years since quitting: 34.8  . Smokeless tobacco: Never Used  Substance Use Topics  . Alcohol use: No  . Drug use: No     Allergies   Ace inhibitors; Demerol; Cosopt [dorzolamide hcl-timolol mal]; Flomax [tamsulosin hcl]; Morphine and related; and Oxytrol [oxybutynin]   Review of Systems Review  of Systems  Constitutional: Negative for fever.  HENT: Negative for sore throat.   Eyes: Negative for visual disturbance.  Respiratory: Negative for shortness of breath.   Cardiovascular: Positive for syncope. Negative for chest pain and palpitations.  Gastrointestinal: Negative for abdominal pain, bowel incontinence, nausea and vomiting.  Genitourinary: Negative for dysuria.  Musculoskeletal: Negative for neck pain.  Skin: Negative for rash.  Neurological: Negative for focal weakness, seizures and headaches.     Physical Exam Updated Vital Signs BP (!) 176/80   Pulse 65   Resp 19   Ht 5\' 11"  (1.803 m)   Wt 97.5 kg   SpO2 93%   BMI 29.99 kg/m   Physical Exam  Constitutional: He appears well-developed and well-nourished.  HENT:  Head: Normocephalic and atraumatic.  Eyes: Conjunctivae are normal.  Neck: Neck supple.  Cardiovascular: Normal rate, regular rhythm and normal heart sounds.  No murmur heard. Pulmonary/Chest: Effort normal and breath sounds normal. No respiratory distress.  Abdominal: He exhibits distension. There is no tenderness. There is no guarding.  Musculoskeletal: He exhibits edema (2+ symmetric). He exhibits no tenderness or deformity.  Neurological: He is alert. He has normal strength. GCS eye subscore is 4. GCS verbal subscore is 5. GCS motor subscore is 6.  Skin: Skin is warm and dry.  Psychiatric: He has a normal mood and affect.  Nursing note and vitals reviewed.    ED Treatments / Results  Labs (all labs ordered are listed, but only abnormal results are displayed) Labs Reviewed  BASIC METABOLIC PANEL - Abnormal; Notable for the following components:      Result Value   Potassium 3.4 (*)    Glucose, Bld 135 (*)    Creatinine, Ser 1.87 (*)    GFR calc non Af Amer 30 (*)    GFR calc Af Amer 35 (*)    All other components within normal limits  CBC WITH DIFFERENTIAL/PLATELET - Abnormal; Notable for the following components:   RBC 4.21 (*)     Hemoglobin 11.5 (*)    HCT 38.6 (*)    MCHC 29.8 (*)    All other components within normal limits  BRAIN NATRIURETIC PEPTIDE - Abnormal; Notable for the following components:   B Natriuretic Peptide 267.6 (*)    All other components within normal limits  CBC -  Abnormal; Notable for the following components:   RBC 3.90 (*)    Hemoglobin 10.8 (*)    HCT 35.5 (*)    All other components within normal limits  BASIC METABOLIC PANEL - Abnormal; Notable for the following components:   Glucose, Bld 173 (*)    Creatinine, Ser 1.82 (*)    Calcium 8.8 (*)    GFR calc non Af Amer 31 (*)    GFR calc Af Amer 36 (*)    All other components within normal limits  GLUCOSE, CAPILLARY - Abnormal; Notable for the following components:   Glucose-Capillary 114 (*)    All other components within normal limits  GLUCOSE, CAPILLARY - Abnormal; Notable for the following components:   Glucose-Capillary 150 (*)    All other components within normal limits  CBG MONITORING, ED - Abnormal; Notable for the following components:   Glucose-Capillary 152 (*)    All other components within normal limits  MRSA PCR SCREENING  TROPONIN I  TROPONIN I  TROPONIN I  I-STAT TROPONIN, ED    EKG EKG Interpretation  Date/Time:  Thursday April 19 2018 18:02:30 EDT Ventricular Rate:  58 PR Interval:    QRS Duration: 140 QT Interval:  456 QTC Calculation: 448 R Axis:   -66 Text Interpretation:  Sinus rhythm Multiple premature complexes, vent & supraven Left bundle branch block Baseline wander in lead(s) V4 similar to prior 7/19 Confirmed by Aletta Edouard [phone]) on 04/19/2018 6:06:56 PM   Radiology Dg Chest 2 View  Result Date: 04/19/2018 CLINICAL DATA:  Syncopal episodes. EXAM: CHEST - 2 VIEW COMPARISON:  02/09/2018 FINDINGS: The heart is enlarged but stable. There is tortuosity of the thoracic aorta. Moderate vascular congestion and possible interstitial pulmonary edema. Suspect small bilateral pleural  effusions. Bibasilar atelectasis. IMPRESSION: Findings suggest CHF with small effusions and bibasilar atelectasis. Electronically Signed   By: Marijo Sanes M.D.   On: 04/19/2018 19:14    Procedures Procedures (including critical care time)  Medications Ordered in ED Medications - No data to display   Initial Impression / Assessment and Plan / ED Course  I have reviewed the triage vital signs and the nursing notes.  Pertinent labs & imaging results that were available during my care of the patient were reviewed by me and considered in my medical decision making (see chart for details).  Clinical Course as of Apr 21 1123  Thu Apr 19, 2018  2050 Patient's family is here now and they said they have noticed some increased shortness of breath and some extra fluid on his abdomen and legs.  Of updated him on the results of his tests and they were hoping he could be admitted so this could be further looked into.   [MB]  2105 Discussed with tried hospitalist Dr. Posey Pronto who will evaluate the patient in the emergency department for admission.   [MB]    Clinical Course User Index [MB] Hayden Rasmussen, MD      Final Clinical Impressions(s) / ED Diagnoses   Final diagnoses:  Syncope and collapse  Hypervolemia, unspecified hypervolemia type    ED Discharge Orders    None       Hayden Rasmussen, MD 04/20/18 1126

## 2018-04-19 NOTE — ED Notes (Signed)
ED Provider at bedside. 

## 2018-04-20 ENCOUNTER — Encounter (HOSPITAL_COMMUNITY): Payer: Self-pay | Admitting: *Deleted

## 2018-04-20 ENCOUNTER — Other Ambulatory Visit: Payer: Self-pay

## 2018-04-20 DIAGNOSIS — E1122 Type 2 diabetes mellitus with diabetic chronic kidney disease: Secondary | ICD-10-CM

## 2018-04-20 DIAGNOSIS — E119 Type 2 diabetes mellitus without complications: Secondary | ICD-10-CM | POA: Diagnosis not present

## 2018-04-20 DIAGNOSIS — R55 Syncope and collapse: Secondary | ICD-10-CM | POA: Diagnosis not present

## 2018-04-20 DIAGNOSIS — E876 Hypokalemia: Secondary | ICD-10-CM | POA: Diagnosis not present

## 2018-04-20 DIAGNOSIS — N184 Chronic kidney disease, stage 4 (severe): Secondary | ICD-10-CM

## 2018-04-20 DIAGNOSIS — Z794 Long term (current) use of insulin: Secondary | ICD-10-CM

## 2018-04-20 DIAGNOSIS — F0280 Dementia in other diseases classified elsewhere without behavioral disturbance: Secondary | ICD-10-CM

## 2018-04-20 DIAGNOSIS — G301 Alzheimer's disease with late onset: Secondary | ICD-10-CM

## 2018-04-20 LAB — BASIC METABOLIC PANEL
Anion gap: 8 (ref 5–15)
BUN: 18 mg/dL (ref 8–23)
CALCIUM: 8.8 mg/dL — AB (ref 8.9–10.3)
CO2: 27 mmol/L (ref 22–32)
Chloride: 105 mmol/L (ref 98–111)
Creatinine, Ser: 1.82 mg/dL — ABNORMAL HIGH (ref 0.61–1.24)
GFR calc Af Amer: 36 mL/min — ABNORMAL LOW (ref >60)
GFR, EST NON AFRICAN AMERICAN: 31 mL/min — AB (ref >60)
Glucose, Bld: 173 mg/dL — ABNORMAL HIGH (ref 70–99)
Potassium: 3.8 mmol/L (ref 3.5–5.1)
Sodium: 140 mmol/L (ref 135–145)

## 2018-04-20 LAB — CBC
HEMATOCRIT: 35.5 % — AB (ref 39.0–52.0)
HEMOGLOBIN: 10.8 g/dL — AB (ref 13.0–17.0)
MCH: 27.7 pg (ref 26.0–34.0)
MCHC: 30.4 g/dL (ref 30.0–36.0)
MCV: 91 fL (ref 80.0–100.0)
NRBC: 0 % (ref 0.0–0.2)
Platelets: 226 10*3/uL (ref 150–400)
RBC: 3.9 MIL/uL — AB (ref 4.22–5.81)
RDW: 14.9 % (ref 11.5–15.5)
WBC: 6.3 10*3/uL (ref 4.0–10.5)

## 2018-04-20 LAB — GLUCOSE, CAPILLARY
GLUCOSE-CAPILLARY: 206 mg/dL — AB (ref 70–99)
Glucose-Capillary: 150 mg/dL — ABNORMAL HIGH (ref 70–99)
Glucose-Capillary: 189 mg/dL — ABNORMAL HIGH (ref 70–99)
Glucose-Capillary: 209 mg/dL — ABNORMAL HIGH (ref 70–99)

## 2018-04-20 LAB — TROPONIN I
Troponin I: <0.03 ng/mL (ref <0.03)
Troponin I: <0.03 ng/mL (ref <0.03)

## 2018-04-20 LAB — MRSA PCR SCREENING: MRSA by PCR: NEGATIVE

## 2018-04-20 MED ORDER — DICLOFENAC SODIUM 1 % TD GEL
4.0000 g | Freq: Four times a day (QID) | TRANSDERMAL | Status: DC
  Administered 2018-04-20 – 2018-04-21 (×4): 4 g via TOPICAL
  Filled 2018-04-20: qty 100

## 2018-04-20 MED ORDER — LORATADINE 10 MG PO TABS
10.0000 mg | ORAL_TABLET | Freq: Every day | ORAL | Status: DC
  Administered 2018-04-20 – 2018-04-21 (×2): 10 mg via ORAL
  Filled 2018-04-20 (×2): qty 1

## 2018-04-20 MED ORDER — FUROSEMIDE 10 MG/ML IJ SOLN
40.0000 mg | Freq: Two times a day (BID) | INTRAMUSCULAR | Status: AC
  Administered 2018-04-20 (×2): 40 mg via INTRAVENOUS
  Filled 2018-04-20 (×2): qty 4

## 2018-04-20 MED ORDER — SACCHAROMYCES BOULARDII 250 MG PO CAPS
250.0000 mg | ORAL_CAPSULE | Freq: Every day | ORAL | Status: DC
  Administered 2018-04-20 – 2018-04-21 (×2): 250 mg via ORAL
  Filled 2018-04-20 (×2): qty 1

## 2018-04-20 MED ORDER — ADULT MULTIVITAMIN W/MINERALS CH
1.0000 | ORAL_TABLET | Freq: Every day | ORAL | Status: DC
  Administered 2018-04-20 – 2018-04-21 (×2): 1 via ORAL
  Filled 2018-04-20 (×2): qty 1

## 2018-04-20 MED ORDER — MIRABEGRON ER 50 MG PO TB24
50.0000 mg | ORAL_TABLET | Freq: Every day | ORAL | Status: DC
  Administered 2018-04-20 – 2018-04-21 (×2): 50 mg via ORAL
  Filled 2018-04-20 (×2): qty 1

## 2018-04-20 MED ORDER — LATANOPROST 0.005 % OP SOLN
1.0000 [drp] | Freq: Every day | OPHTHALMIC | Status: DC
  Administered 2018-04-20: 1 [drp] via OPHTHALMIC
  Filled 2018-04-20: qty 2.5

## 2018-04-20 NOTE — Progress Notes (Signed)
Progress Note    Gregory Hatfield  WGN:[id number] DOB: 30-Aug-1925  DOA: 04/19/2018 PCP: [name], MD    Brief Narrative:     Medical records reviewed and are as summarized below:  Gregory Hatfield is an 84 y.o. male with medical history significant of IDDM, Chronic dCHF, Alzheimer's dementia, HTN, HLD, and CKD4 who presents from his assisted living facility after a syncopal episode.  Patient was sitting his wheelchair waiting for dinner to be served when he suddenly passed out.  Per report given to ED provider, staff at the facility noted that he was slumped over, drooling, and difficult to arouse.  He had never lost pulse.  Blood pressure was reportedly very high.  EMS were called and he had improved by their arrival.  Patient states that the only thing he remembers is waking up in the ambulance.  He says he has been frequently falling asleep, but has not passed out like he did today.  He recalls feeling the urge to urinate prior to this episode, but denies any incontinence. He has also noticed some increased shortness of breath and swelling his legs and abdomen.  Assessment/Plan:   Principal Problem:   Syncope Active Problems:   SDAT (senile dementia of Alzheimer's type) ([place])   Insulin-requiring or dependent type II diabetes mellitus ([place])   Essential hypertension   CKD stage 4 due to type 2 diabetes mellitus ([facility])   Chronic diastolic CHF (congestive heart failure) ([facility])   Hypokalemia  Syncope -etiology not clear-- TIA vs arrhythmia vs volume overload -BS per EMS >100 -monitor on tele  Acute on chronic diastolic CHF -IV lasix today -reassess in AM  -BMP -I/O, daily weights -from August to now seems to be up around 10lbs  Type 2 DM -SSI -monitor blood sugars and resume home doses as able (recent decrease by Dr. Loanne Drilling to : 70/30 insulin to 45 units with breakfast, and 20 units with supper.  )  Hypokalemia -replete and check Mg  CKD IV -monitor closely  with diuresis  Dementia -on zyprexa and depakote -Qtc was <500 on most recent EKG  obesity Body mass index is 30.78 kg/m.  PT eval: admitted from [facility]? -social work consult  Family Communication/Anticipated D/C date and plan/Code Status   DVT prophylaxis: heparin Code Status: Full Code.  Family Communication: none at bedside Disposition Plan: from ALF? PT consult and diuresis-- 24-48 more hours   Medical Consultants:   none  Subjective:   Does not remember anything except waking up in the ambulance  Objective:    Vitals:   04/19/18 2241 04/20/18 0031 04/20/18 0526 04/20/18 0837  BP: (!) 168/65 (!) 178/87 (!) 159/81 (!) 181/79  Pulse: 68 61 62 77  Resp: 18 18 20    Temp: 97.9 F (36.6 C)  (!) 97.5 F (36.4 C)   TempSrc: Oral  Oral   SpO2: 96% 94% 94%   Weight:   100.1 kg   Height:        Intake/Output Summary (Last 24 hours) at 04/20/2018 1112 Last data filed at 04/20/2018 7846 Gross per 24 hour  Intake 240 ml  Output 1900 ml  Net -1660 ml   Filed Weights   04/19/18 1806 04/20/18 0526  Weight: 97.5 kg 100.1 kg    Exam: In bed, eating breakfast [place] at bases +LE edema- 2+ rrr  Data Reviewed:   I have personally reviewed following labs and imaging studies:  Labs: Labs show the following:   Basic  Metabolic Panel: Recent Labs  Lab 04/19/18 1943 04/20/18 0357  NA 143 140  K 3.4* 3.8  CL 104 105  CO2 25 27  GLUCOSE 135* 173*  BUN 17 18  CREATININE 1.87* 1.82*  CALCIUM 9.5 8.8*   GFR Estimated Creatinine Clearance: 32.5 mL/min (A) (by C-G formula based on SCr of 1.82 mg/dL (H)). Liver Function Tests: No results for input(s): AST, ALT, ALKPHOS, BILITOT, PROT, ALBUMIN in the last 168 hours. No results for input(s): LIPASE, AMYLASE in the last 168 hours. No results for input(s): AMMONIA in the last 168 hours. Coagulation profile No results for input(s): INR, PROTIME in the last 168 hours.  CBC: Recent Labs  Lab  04/19/18 1943 04/20/18 0357  WBC 5.9 6.3  NEUTROABS 2.8  --   HGB 11.5* 10.8*  HCT 38.6* 35.5*  MCV 91.7 91.0  PLT 257 226   Cardiac Enzymes: Recent Labs  Lab 04/19/18 2215 04/20/18 0357  TROPONINI <0.03 <0.03   BNP (last 3 results) No results for input(s): PROBNP in the last 8760 hours. CBG: Recent Labs  Lab 04/19/18 1839 04/19/18 2255 04/20/18 0740  GLUCAP 152* 114* 150*   D-Dimer: No results for input(s): DDIMER in the last 72 hours. Hgb A1c: No results for input(s): HGBA1C in the last 72 hours. Lipid Profile: No results for input(s): CHOL, HDL, LDLCALC, TRIG, CHOLHDL, LDLDIRECT in the last 72 hours. Thyroid function studies: No results for input(s): TSH, T4TOTAL, T3FREE, THYROIDAB in the last 72 hours.  Invalid input(s): FREET3 Anemia work up: No results for input(s): VITAMINB12, FOLATE, FERRITIN, TIBC, IRON, RETICCTPCT in the last 72 hours. Sepsis Labs: Recent Labs  Lab 04/19/18 1943 04/20/18 0357  WBC 5.9 6.3    Microbiology Recent Results (from the past 240 hour(s))  MRSA PCR Screening     Status: None   Collection Time: 04/20/18  2:38 AM  Result Value Ref Range Status   MRSA by PCR NEGATIVE NEGATIVE Final    Comment:        The GeneXpert MRSA Assay (FDA approved for NASAL specimens only), is one component of a comprehensive MRSA colonization surveillance program. It is not intended to diagnose MRSA infection nor to guide or monitor treatment for MRSA infections. Performed at [facility], [place] [address] [place], [place] [address]     Procedures and diagnostic studies:  Dg Chest 2 View  Result Date: 04/19/2018 CLINICAL DATA:  Syncopal episodes. EXAM: CHEST - 2 VIEW COMPARISON:  02/09/2018 FINDINGS: The heart is enlarged but stable. There is tortuosity of the thoracic aorta. Moderate vascular congestion and possible interstitial pulmonary edema. Suspect small bilateral pleural effusions. Bibasilar atelectasis. IMPRESSION: Findings  suggest CHF with small effusions and bibasilar atelectasis. Electronically Signed   By: Marijo Sanes M.D.   On: 04/19/2018 19:14    Medications:   . amLODipine  10 mg Oral Daily  . aspirin  81 mg Oral Daily  . carvedilol  6.25 mg Oral BID WC  . cloNIDine  0.2 mg Transdermal Q Fri  . divalproex  125 mg Oral BID  . furosemide  40 mg Intravenous Q12H  . heparin  5,000 Units Subcutaneous Q8H  . insulin aspart  0-9 Units Subcutaneous TID WC  . OLANZapine  5 mg Oral QHS  . sodium chloride flush  3 mL Intravenous Q12H   Continuous Infusions:   LOS: 0 days   Geradine Girt  [facility]   *Please refer to [email or web address], password [email or web address] to get updated schedule  on who will round on this patient, as hospitalists switch teams weekly. If 7PM-7AM, please contact night-coverage at [email or web address], password [email or web address] for any overnight needs.  04/20/2018, 11:12 AM

## 2018-04-21 DIAGNOSIS — I5032 Chronic diastolic (congestive) heart failure: Secondary | ICD-10-CM | POA: Diagnosis not present

## 2018-04-21 DIAGNOSIS — R55 Syncope and collapse: Secondary | ICD-10-CM

## 2018-04-21 LAB — CBC
HEMATOCRIT: 34.9 % — AB (ref 39.0–52.0)
Hemoglobin: 10.5 g/dL — ABNORMAL LOW (ref 13.0–17.0)
MCH: 27.3 pg (ref 26.0–34.0)
MCHC: 30.1 g/dL (ref 30.0–36.0)
MCV: 90.6 fL (ref 80.0–100.0)
PLATELETS: 208 10*3/uL (ref 150–400)
RBC: 3.85 MIL/uL — ABNORMAL LOW (ref 4.22–5.81)
RDW: 14.7 % (ref 11.5–15.5)
WBC: 6 10*3/uL (ref 4.0–10.5)
nRBC: 0 % (ref 0.0–0.2)

## 2018-04-21 LAB — BASIC METABOLIC PANEL
Anion gap: 6 (ref 5–15)
BUN: 21 mg/dL (ref 8–23)
CALCIUM: 8.7 mg/dL — AB (ref 8.9–10.3)
CO2: 28 mmol/L (ref 22–32)
CREATININE: 1.87 mg/dL — AB (ref 0.61–1.24)
Chloride: 104 mmol/L (ref 98–111)
GFR calc Af Amer: 35 mL/min — ABNORMAL LOW (ref >60)
GFR, EST NON AFRICAN AMERICAN: 30 mL/min — AB (ref >60)
Glucose, Bld: 210 mg/dL — ABNORMAL HIGH (ref 70–99)
Potassium: 3.3 mmol/L — ABNORMAL LOW (ref 3.5–5.1)
Sodium: 138 mmol/L (ref 135–145)

## 2018-04-21 LAB — MAGNESIUM: Magnesium: 1.9 mg/dL (ref 1.7–2.4)

## 2018-04-21 LAB — GLUCOSE, CAPILLARY
GLUCOSE-CAPILLARY: 185 mg/dL — AB (ref 70–99)
Glucose-Capillary: 217 mg/dL — ABNORMAL HIGH (ref 70–99)

## 2018-04-21 MED ORDER — POTASSIUM CHLORIDE CRYS ER 20 MEQ PO TBCR: 20.0000 meq | EXTENDED_RELEASE_TABLET | Freq: Every day | ORAL | 0 refills | Status: DC

## 2018-04-21 MED ORDER — HYDRALAZINE HCL 50 MG PO TABS: 50.0000 mg | ORAL_TABLET | Freq: Three times a day (TID) | ORAL | 0 refills | Status: DC

## 2018-04-21 MED ORDER — HYDRALAZINE HCL 50 MG PO TABS
50.0000 mg | ORAL_TABLET | Freq: Three times a day (TID) | ORAL | Status: DC
  Administered 2018-04-21: 50 mg via ORAL
  Filled 2018-04-21: qty 1

## 2018-04-21 MED ORDER — POTASSIUM CHLORIDE CRYS ER 20 MEQ PO TBCR: 20.0000 meq | EXTENDED_RELEASE_TABLET | Freq: Every day | ORAL | Status: DC

## 2018-04-21 MED ORDER — FUROSEMIDE 20 MG PO TABS: 60.0000 mg | ORAL_TABLET | Freq: Every day | ORAL | 0 refills | Status: DC

## 2018-04-21 MED ORDER — POTASSIUM CHLORIDE CRYS ER 20 MEQ PO TBCR
40.0000 meq | EXTENDED_RELEASE_TABLET | Freq: Once | ORAL | Status: AC
  Administered 2018-04-21: 40 meq via ORAL
  Filled 2018-04-21: qty 2

## 2018-04-21 NOTE — Progress Notes (Signed)
Pt discharge education provided to pt and pt daughter at bedside  Pt has all belongings including printed prescriptions  NT removed IV and telemetry

## 2018-04-21 NOTE — Progress Notes (Signed)
Patient discharged with all his belongings.  Daughter/POA here to pick patient up.  Marcille Blanco, RN

## 2018-04-21 NOTE — Progress Notes (Signed)
Clinical Social Worker facilitated patient discharge including contacting patient family and facility to confirm patient discharge plans. Clinical information faxed to facility and family agreeable with plan.  Pt family will transport to facility.   Clinical Social Worker will sign off for now as social work intervention is no longer needed. Please consult [place] again if new need arises.  Mahamad Zylee Marchiano LCSW [phone]

## 2018-04-21 NOTE — Discharge Summary (Signed)
Physician Discharge Summary  Gregory Hatfield WER:[id number] DOB: 1925/09/18 DOA: 04/19/2018  PCP: [name], MD  Admit date: 04/19/2018 Discharge date: 04/21/2018  Admitted From: Assisted living facility Disposition: Assisted living facility  Recommendations for Outpatient Follow-up:  1. Follow up with PCP in 1-2 weeks 2. Please obtain BMP/CBC in one week   Home Health: No Equipment/Devices: None  Discharge Condition: Stable CODE STATUS: Currently full code but family wishes to have discussion with physician at facility regarding DNR status Diet recommendation: Heart Healthy / Carb Modified 1500 mL per 24-hour fluid restriction  Brief/Interim Summary: Gregory Hatfield is an 84 y.o. male with medical history significant ofIDDM, Chronic dCHF, Alzheimer's dementia, HTN, HLD, and CKD4who presents from his assisted living facility after a syncopal episode. Patient was sitting his wheelchair waiting for dinner to be servedwhen he suddenly passed out. Per report given to ED provider,staff at the facility noted that he was slumped over, drooling, and difficult to arouse. He had never lost pulse. Blood pressure was reportedly very high. EMS were called and he had improved by their arrival. Patient states that the only thing he remembers is waking up in the ambulance. He says he has been frequently falling asleep, but has not passed out likehe did today. He recalls feeling the urge to urinate prior to this episode, but denies any incontinence. He has also noticed some increased shortness of breath and swelling his legs and abdomen.  It was felt that his syncope may have been related to volume overload and his Lasix was increased.  I had a detailed discussion with the patient's daughter she said his Lasix dosing has recently been changed but the patient has also moved into an assisted living facility where due to "patient writes" they will not limit his fluids.  I encouraged her to  discuss with them that he is not able to make decisions for himself and therefore she can determine how much fluid he gets to drink thus preventing hospitalization.  Discussed daily weights and calling for an increase in 3 pounds in 1 day, 3 days, and 7 days.  Will be started on potassium repletion as well given that his potassium dropped while in the hospital with creased Lasix.  Is also concerned because of his hydralazine having been stopped.  Since that his hydralazine was discontinued his blood pressure has been elevated.  He was previously on 100 mg 3 times daily of hydralazine and for some reason that was discontinued but she is not sure why.  Given that his blood pressures have been elevated in the hospital I have restarted hydralazine but it 50 mg every 8 hours due to concerns over increased Lasix dose lowering blood pressure as well.  Patient has reached maximal benefit of hospitalization.  Discharge diagnosis, prognosis, plans, follow-up, medications and treatments discussed with the patient(or responsible party) and is in agreement with the plans as described.  Patient is stable for discharge.  Discharge Diagnoses:  Principal Problem:   Syncope Active Problems:   Essential hypertension   Chronic diastolic CHF (congestive heart failure) (HCC)   Hypokalemia   Insulin-requiring or dependent type II diabetes mellitus ([place])   CKD stage 4 due to type 2 diabetes mellitus (HCC)   Goals of care, counseling/discussion   SDAT (senile dementia of Alzheimer's type) [facility])    Discharge Instructions  Discharge Instructions    (HEART FAILURE PATIENTS) Call MD:  Anytime you have any of the following symptoms: 1) 3 pound weight gain in 24  hours or 5 pounds in 1 week 2) shortness of breath, with or without a dry hacking cough 3) swelling in the hands, feet or stomach 4) if you have to sleep on extra pillows at night in order to breathe.   Complete by:  As directed    Diet - low sodium heart healthy    Complete by:  As directed    1500 mL per 24-hour fluid restriction   Discharge instructions   Complete by:  As directed    Limit (restrict) fluids to approximately 1500 mL/day.  That about six 8 ounce glasses of fluid daily.  Weigh yourself daily recorded in a log with date and time.  Check back one day, 3 days, and 7 days, if weight is greater then 3 pounds from previous then notify physician about potentially taking extra Lasix.  Use compression hose during the daytime.   Heart Failure patients record your daily weight using the same scale at the same time of day   Complete by:  As directed    Increase activity slowly   Complete by:  As directed      Allergies as of 04/21/2018      Reactions   Ace Inhibitors Other (See Comments), Cough   Chronic cough   Demerol Other (See Comments)   Hard to wake up   Cosopt [dorzolamide Hcl-timolol Mal] Other (See Comments)   Cause heart rate to drop   Flomax [tamsulosin Hcl] Other (See Comments)   Low BP   Morphine And Related Other (See Comments)   hallucinations   Oxytrol [oxybutynin] Rash      Medication List    TAKE these medications   amLODipine 10 MG tablet Commonly known as:  NORVASC Take 1 tablet (10 mg total) by mouth daily.   aspirin 81 MG tablet Take 81 mg by mouth daily.   B-D UF III MINI PEN NEEDLES 31G X 5 MM Misc Generic drug:  Insulin Pen Needle USE FOR INJECTIONS AS DIRECTED   carvedilol 6.25 MG tablet Commonly known as:  COREG TAKE 1 TABLET BY MOUTH TWICE DAILY WITH A MEAL. What changed:  See the new instructions.   cholecalciferol 1000 units tablet Commonly known as:  VITAMIN D Take 1,000 Units by mouth daily.   cloNIDine 0.2 mg/24hr patch Commonly known as:  CATAPRES - Dosed in mg/24 hr Place 1 patch (0.2 mg total) onto the skin once a week. What changed:  when to take this   diclofenac sodium 1 % Gel Commonly known as:  VOLTAREN Apply 4 g topically 4 (four) times daily.   divalproex 125 MG  capsule Commonly known as:  DEPAKOTE SPRINKLE Take 125 mg by mouth 2 (two) times daily.   feeding supplement (ENSURE ENLIVE) Liqd Take 237 mLs by mouth 2 (two) times daily between meals.   furosemide 20 MG tablet Commonly known as:  LASIX Take 3 tablets (60 mg total) by mouth daily. What changed:  how much to take   geriatric multivitamins-minerals Liqd Take 15 mLs by mouth daily.   haloperidol 2 MG tablet Commonly known as:  HALDOL Take 1 tablet (2 mg total) by mouth every 4 (four) hours as needed for agitation.   HUMULIN 70/30 (70-30) 100 UNIT/ML injection Generic drug:  insulin NPH-regular Human 45 units with breakfast, and 20 units with supper. What changed:    how much to take  how to take this  when to take this  additional instructions   hydrALAZINE 50 MG tablet Commonly  known as:  APRESOLINE Take 1 tablet (50 mg total) by mouth every 8 (eight) hours. What changed:    medication strength  how much to take  when to take this  additional instructions   latanoprost 0.005 % ophthalmic solution Commonly known as:  XALATAN Place 1 drop into the right eye at bedtime.   LEVEMIR FLEXTOUCH 100 UNIT/ML Pen Generic drug:  Insulin Detemir INJECT 50 UNITS IN THE AM AND 25 UNITS IN THE PM   liver oil-zinc oxide 40 % ointment Commonly known as:  DESITIN Apply 1 application topically as needed for irritation.   loperamide 2 MG tablet Commonly known as:  IMODIUM A-D Take 2 mg by mouth every 6 (six) hours as needed for diarrhea or loose stools.   loratadine 10 MG tablet Commonly known as:  CLARITIN Take 10 mg by mouth daily.   multivitamin tablet Take 1 tablet by mouth daily.   MYRBETRIQ 50 MG Tb24 tablet Generic drug:  mirabegron ER Take 50 mg by mouth daily.   OLANZapine 5 MG tablet Commonly known as:  ZYPREXA Take 1 tablet (5 mg total) by mouth at bedtime.   ondansetron 4 MG tablet Commonly known as:  ZOFRAN Take 1 tablet (4 mg total) by mouth  daily as needed for nausea or vomiting.   ONE TOUCH ULTRA TEST test strip Generic drug:  glucose blood CHECK BLOOD SUGAR 6 TIMES A DAY.   potassium chloride SA 20 MEQ tablet Commonly known as:  K-DUR,KLOR-CON Take 1 tablet (20 mEq total) by mouth daily. Start taking on:  04/22/2018   saccharomyces boulardii 250 MG capsule Commonly known as:  FLORASTOR Take 250 mg by mouth daily.   senna-docusate 8.6-50 MG tablet Commonly known as:  Senokot-S Take 1 tablet by mouth at bedtime.   SIMBRINZA 1-0.2 % Susp Generic drug:  Brinzolamide-Brimonidine Place 1 drop into both eyes 3 (three) times daily.   simethicone 80 MG chewable tablet Commonly known as:  MYLICON Chew 80 mg by mouth every 6 (six) hours as needed for flatulence.   SYSTANE OP Place 1 drop into both eyes every 8 (eight) hours as needed (dry eyes).      Follow-up Information    [name], MD Follow up in 1 week(s).   Specialty:  Internal Medicine Contact information: [address] [place] [address] [phone]          Allergies  Allergen Reactions  . Ace Inhibitors Other (See Comments) and Cough    Chronic cough  . Demerol Other (See Comments)    Hard to wake up  . Cosopt [Dorzolamide Hcl-Timolol Mal] Other (See Comments)    Cause heart rate to drop  . Flomax [Tamsulosin Hcl] Other (See Comments)    Low BP  . Morphine And Related Other (See Comments)    hallucinations  . Oxytrol [Oxybutynin] Rash     Procedures/Studies: Dg Chest 2 View  Result Date: 04/19/2018 CLINICAL DATA:  Syncopal episodes. EXAM: CHEST - 2 VIEW COMPARISON:  02/09/2018 FINDINGS: The heart is enlarged but stable. There is tortuosity of the thoracic aorta. Moderate vascular congestion and possible interstitial pulmonary edema. Suspect small bilateral pleural effusions. Bibasilar atelectasis. IMPRESSION: Findings suggest CHF with small effusions and bibasilar atelectasis. Electronically Signed   By: Marijo Sanes M.D.   On: 04/19/2018 19:14      Subjective: Patient feeling better sitting up in chair.  Notes less edema.  Discharge Exam: Vitals:   04/21/18 0719 04/21/18 0908  BP:  Marland Kitchen)  165/66  Pulse: (!) 55 77  Resp:    Temp:    SpO2:     Vitals:   04/21/18 0540 04/21/18 0629 04/21/18 0719 04/21/18 0908  BP: (!) 169/72 (!) 165/59  (!) 165/66  Pulse: (!) 57 (!) 52 (!) 55 77  Resp: 18     Temp: (!) 97.4 F (36.3 C)     TempSrc: Oral     SpO2: 94%     Weight: 99.6 kg     Height:        General: Pt is alert, awake, not in acute distress Cardiovascular: RRR, S1/S2 +, no rubs, no gallops Respiratory: CTA bilaterally, no wheezing, no rhonchi Abdominal: Soft, NT, ND, bowel sounds + Extremities: Edema decreased, no cyanosis    The results of significant diagnostics from this hospitalization (including imaging, microbiology, ancillary and laboratory) are listed below for reference.     Microbiology: Recent Results (from the past 240 hour(s))  MRSA PCR Screening     Status: None   Collection Time: 04/20/18  2:38 AM  Result Value Ref Range Status   MRSA by PCR NEGATIVE NEGATIVE Final    Comment:        The GeneXpert MRSA Assay (FDA approved for NASAL specimens only), is one component of a comprehensive MRSA colonization surveillance program. It is not intended to diagnose MRSA infection nor to guide or monitor treatment for MRSA infections. Performed at [facility], [place] [address] [place], [place] [address]      Labs: BNP (last 3 results) Recent Labs    01/16/18 1118 01/16/18 1404 04/19/18 1943  BNP 599* 552.9* 314.9*   Basic Metabolic Panel: Recent Labs  Lab 04/19/18 1943 04/20/18 0357 04/21/18 0608  NA 143 140 138  K 3.4* 3.8 3.3*  CL 104 105 104  CO2 25 27 28   GLUCOSE 135* 173* 210*  BUN 17 18 21   CREATININE 1.87* 1.82* 1.87*  CALCIUM 9.5 8.8* 8.7*  MG  --   --  1.9   Liver Function Tests: No results for input(s): AST, ALT, ALKPHOS,  BILITOT, PROT, ALBUMIN in the last 168 hours. No results for input(s): LIPASE, AMYLASE in the last 168 hours. No results for input(s): AMMONIA in the last 168 hours. CBC: Recent Labs  Lab 04/19/18 1943 04/20/18 0357 04/21/18 0608  WBC 5.9 6.3 6.0  NEUTROABS 2.8  --   --   HGB 11.5* 10.8* 10.5*  HCT 38.6* 35.5* 34.9*  MCV 91.7 91.0 90.6  PLT 257 226 208   Cardiac Enzymes: Recent Labs  Lab 04/19/18 2215 04/20/18 0357 04/20/18 1007  TROPONINI <0.03 <0.03 <0.03   BNP: Invalid input(s): POCBNP CBG: Recent Labs  Lab 04/20/18 1132 04/20/18 1616 04/20/18 2106 04/21/18 0743 04/21/18 1200  GLUCAP 206* 189* 209* 185* 217*   D-Dimer No results for input(s): DDIMER in the last 72 hours. Hgb A1c No results for input(s): HGBA1C in the last 72 hours. Lipid Profile No results for input(s): CHOL, HDL, LDLCALC, TRIG, CHOLHDL, LDLDIRECT in the last 72 hours. Thyroid function studies No results for input(s): TSH, T4TOTAL, T3FREE, THYROIDAB in the last 72 hours.  Invalid input(s): FREET3 Anemia work up No results for input(s): VITAMINB12, FOLATE, FERRITIN, TIBC, IRON, RETICCTPCT in the last 72 hours. Urinalysis    Component Value Date/Time   COLORURINE YELLOW 03/22/2018 1204   APPEARANCEUR CLEAR 03/22/2018 1204   LABSPEC 1.014 03/22/2018 1204   PHURINE 6.5 03/22/2018 1204   GLUCOSEU NEGATIVE 03/22/2018 1204   HGBUR  NEGATIVE 03/22/2018 [place] 02/09/2018 0212   KETONESUR NEGATIVE 03/22/2018 1204   PROTEINUR 2+ (A) 03/22/2018 1204   UROBILINOGEN 0.2 05/05/2015 1023   NITRITE NEGATIVE 03/22/2018 1204   LEUKOCYTESUR NEGATIVE 03/22/2018 1204   Sepsis Labs Invalid input(s): PROCALCITONIN,  WBC,  LACTICIDVEN Microbiology Recent Results (from the past 240 hour(s))  MRSA PCR Screening     Status: None   Collection Time: 04/20/18  2:38 AM  Result Value Ref Range Status   MRSA by PCR NEGATIVE NEGATIVE Final    Comment:        The GeneXpert MRSA Assay  (FDA approved for NASAL specimens only), is one component of a comprehensive MRSA colonization surveillance program. It is not intended to diagnose MRSA infection nor to guide or monitor treatment for MRSA infections. Performed at [facility], [place] [address] [place], [place] [address]      Time coordinating discharge: 43 minutes  SIGNED:   Lady Deutscher, MD  FACP [facility] 04/21/2018, 12:10 PM Pager   If 7PM-7AM, please contact night-coverage [email or web address] Password [email or web address]

## 2018-05-03 ENCOUNTER — Ambulatory Visit (INDEPENDENT_AMBULATORY_CARE_PROVIDER_SITE_OTHER): Payer: Medicare Other | Admitting: Endocrinology

## 2018-05-03 ENCOUNTER — Encounter: Payer: Self-pay | Admitting: Endocrinology

## 2018-05-03 VITALS — BP 144/70 | HR 62 | Ht 71.0 in

## 2018-05-03 DIAGNOSIS — E119 Type 2 diabetes mellitus without complications: Secondary | ICD-10-CM | POA: Diagnosis not present

## 2018-05-03 DIAGNOSIS — Z794 Long term (current) use of insulin: Secondary | ICD-10-CM | POA: Diagnosis not present

## 2018-05-03 MED ORDER — HUMULIN 70/30 (70-30) 100 UNIT/ML ~~LOC~~ SUSP: SUBCUTANEOUS | Status: DC

## 2018-05-03 NOTE — Patient Instructions (Addendum)
Please check resident's blood sugar 4 times a day--before the 3 meals, and at bedtime.  also check if you have symptoms of your blood sugar being too high or too low.  please keep a record of the readings and send it with resident to his appointments here.  please call us sooner for cbg below 70.   Please reduce the 70/30 insulin to 45 units with breakfast, and 15 units with supper.   On this type of insulin schedule, resident should eat meals on a regular schedule.  If a meal is missed or significantly delayed, his blood sugar could go low.  Please come back for a follow-up appointment in 2 months.

## 2018-05-03 NOTE — Progress Notes (Signed)
Subjective:    Patient ID: Gregory Hatfield, male    DOB: 1925-06-29, 84 y.o.   MRN: [id number]  HPI Pt returns for f/u of diabetes mellitus: DM type: Insulin-requiring type 2 Dx'ed: 7672 Complications: renal insuff, retinopathy, CAD, and CHF.   Therapy: insulin since 2003 DKA: never Severe hypoglycemia: never.   Pancreatitis: never. Other: he is on a BID insulin regimen, due to noncompliance with multiple daily injections.   Interval history: He is in [facility] assist living, since he fell at home, and fx right arm.  He is not sure how long he expects to be there. Staff there gives insulin, and checks cbg's.  Med list is reviewed.  he takes 70/30 insulin, 45 units with breakfast, and 20 units with supper. He brings a record of his cbg's which I have reviewed today.  It varies from 41-220, but most are in the 100's.  It is lowest fasting.  Past Medical History:  Diagnosis Date  . Bradycardia 10/06/2016  . CHF (congestive heart failure) ([place])   . CKD stage 4 due to type 2 diabetes mellitus ([place]) 06/03/2015  . Dyslipidemia associated with type 2 diabetes mellitus ([place]) 05/05/2015  . Essential hypertension 05/05/2015  . IDDM (insulin dependent diabetes mellitus) ([place])   . SDAT (senile dementia of Alzheimer's type) 06/16/2013  . Uncontrolled insulin-dependent diabetes mellitus with renal manifestation ([place]) 05/05/2015    Past Surgical History:  Procedure Laterality Date  . CARDIAC CATHETERIZATION  04/2004   normal r-sided pressure, mild pulm HTN  . CARPAL TUNNEL RELEASE Right   . CERVICAL SPINE SURGERY  1990's  . LUMBAR SPINE SURGERY  2003  . PROSTATE SURGERY  10/2010   transurethral resection   . TOTAL HIP ARTHROPLASTY Left 01/17/2014   Procedure: LEFT TOTAL HIP ARTHROPLASTY;  Surgeon: Yvette Rack., MD;  Location: [facility];  Service: Orthopedics;  Laterality: Left;  . TRANSTHORACIC ECHOCARDIOGRAM  05/2006   EF 60-70%; mild calcif of MV; mild MV regurg; LA mildly dilated  .  TRANSURETHRAL RESECTION OF PROSTATE N/A 08/22/2014   Procedure: TRANSURETHRAL RESECTION OF THE PROSTATE (TURP);  Surgeon: Marcia Brash, MD;  Location: [facility];  Service: Urology;  Laterality: N/A;    Social History   Socioeconomic History  . Marital status: Widowed    Spouse name: Not on file  . Number of children: 5  . Years of education: master's  . Highest education level: Not on file  Occupational History  . Occupation: Therapist, music: RETIRED  Social Needs  . Financial resource strain: Not on file  . Food insecurity:    Worry: Not on file    Inability: Not on file  . Transportation needs:    Medical: Not on file    Non-medical: Not on file  Tobacco Use  . Smoking status: Former Smoker    Last attempt to quit: 06/21/1983    Years since quitting: 34.8  . Smokeless tobacco: Never Used  Substance and Sexual Activity  . Alcohol use: No  . Drug use: No  . Sexual activity: Never  Lifestyle  . Physical activity:    Days per week: Not on file    Minutes per session: Not on file  . Stress: Not on file  Relationships  . Social connections:    Talks on phone: Not on file    Gets together: Not on file    Attends religious service: Not on file    Active member of club or organization: Not  on file    Attends meetings of clubs or organizations: Not on file    Relationship status: Not on file  . Intimate partner violence:    Fear of current or ex partner: Not on file    Emotionally abused: Not on file    Physically abused: Not on file    Forced sexual activity: Not on file  Other Topics Concern  . Not on file  Social History Narrative  . Not on file    Current Outpatient Medications on File Prior to Visit  Medication Sig Dispense Refill  . amLODipine (NORVASC) 10 MG tablet Take 1 tablet (10 mg total) by mouth daily. 30 tablet 0  . aspirin 81 MG tablet Take 81 mg by mouth daily.    . B-D UF III MINI PEN NEEDLES 31G X 5 MM MISC USE FOR INJECTIONS AS  DIRECTED 100 each 0  . carvedilol (COREG) 6.25 MG tablet TAKE 1 TABLET BY MOUTH TWICE DAILY WITH A MEAL. (Patient taking differently: Take 6.25 mg by mouth 2 (two) times daily with a meal. ) 180 tablet 0  . cholecalciferol (VITAMIN D) 1000 units tablet Take 1,000 Units by mouth daily.    . cloNIDine (CATAPRES - DOSED IN MG/24 HR) 0.2 mg/24hr patch Place 1 patch (0.2 mg total) onto the skin once a week. (Patient taking differently: Place 0.2 mg onto the skin every Friday. ) 4 patch 12  . diclofenac sodium (VOLTAREN) 1 % GEL Apply 4 g topically 4 (four) times daily.     . divalproex (DEPAKOTE SPRINKLE) 125 MG capsule Take 125 mg by mouth 2 (two) times daily.     . feeding supplement, ENSURE ENLIVE, (ENSURE ENLIVE) LIQD Take 237 mLs by mouth 2 (two) times daily between meals. 237 mL 12  . furosemide (LASIX) 20 MG tablet Take 3 tablets (60 mg total) by mouth daily. 90 tablet 0  . geriatric multivitamins-minerals (ELDERTONIC/GEVRABON) LIQD Take 15 mLs by mouth daily.    . haloperidol (HALDOL) 2 MG tablet Take 1 tablet (2 mg total) by mouth every 4 (four) hours as needed for agitation. 30 tablet 0  . hydrALAZINE (APRESOLINE) 50 MG tablet Take 1 tablet (50 mg total) by mouth every 8 (eight) hours. 90 tablet 0  . latanoprost (XALATAN) 0.005 % ophthalmic solution Place 1 drop into the right eye at bedtime.     Marland Kitchen LEVEMIR FLEXTOUCH 100 UNIT/ML Pen INJECT 50 UNITS IN THE AM AND 25 UNITS IN THE PM 15 mL 0  . liver oil-zinc oxide (DESITIN) 40 % ointment Apply 1 application topically as needed for irritation.    Marland Kitchen loperamide (IMODIUM A-D) 2 MG tablet Take 2 mg by mouth every 6 (six) hours as needed for diarrhea or loose stools.    Marland Kitchen loratadine (CLARITIN) 10 MG tablet Take 10 mg by mouth daily.    . Multiple Vitamin (MULTIVITAMIN) tablet Take 1 tablet by mouth daily.      Marland Kitchen MYRBETRIQ 50 MG TB24 tablet Take 50 mg by mouth daily.     Marland Kitchen OLANZapine (ZYPREXA) 5 MG tablet Take 1 tablet (5 mg total) by mouth at bedtime.  30 tablet 0  . ondansetron (ZOFRAN) 4 MG tablet Take 1 tablet (4 mg total) by mouth daily as needed for nausea or vomiting. 30 tablet 1  . ONE TOUCH ULTRA TEST test strip CHECK BLOOD SUGAR 6 TIMES A DAY. 100 each 6  . Polyethyl Glycol-Propyl Glycol (SYSTANE OP) Place 1 drop into both eyes every 8 (  eight) hours as needed (dry eyes).    . potassium chloride SA (K-DUR,KLOR-CON) 20 MEQ tablet Take 1 tablet (20 mEq total) by mouth daily. 30 tablet 0  . saccharomyces boulardii (FLORASTOR) 250 MG capsule Take 250 mg by mouth daily.    Marland Kitchen senna-docusate (SENOKOT-S) 8.6-50 MG tablet Take 1 tablet by mouth at bedtime.    Marland Kitchen SIMBRINZA 1-0.2 % SUSP Place 1 drop into both eyes 3 (three) times daily.    . simethicone (MYLICON) 80 MG chewable tablet Chew 80 mg by mouth every 6 (six) hours as needed for flatulence.     No current facility-administered medications on file prior to visit.     Allergies  Allergen Reactions  . Ace Inhibitors Other (See Comments) and Cough    Chronic cough  . Demerol Other (See Comments)    Hard to wake up  . Cosopt [Dorzolamide Hcl-Timolol Mal] Other (See Comments)    Cause heart rate to drop  . Flomax [Tamsulosin Hcl] Other (See Comments)    Low BP  . Morphine And Related Other (See Comments)    hallucinations  . Oxytrol [Oxybutynin] Rash    Family History  Problem Relation Age of Onset  . Diabetes Mother   . Hypertension Mother   . Prostate cancer Father   . Heart disease Father   . Diabetes Brother   . Cancer Brother   . Heart disease Brother   . Diabetes Sister   . Heart disease Sister   . Hyperlipidemia Child   . Hypertension Child   . Diabetes Child   . Heart attack Child   . Cancer Child     BP (!) 144/70 (BP Location: Right Arm, Patient Position: Sitting, Cuff Size: Large)   Pulse 62   Ht 5\' 11"  (1.803 m) Comment: unable to stand for height  SpO2 92%   BMI 30.62 kg/m    Review of Systems He was in the hospital with syncope, but it was felt  not due to DM    Objective:   Physical Exam VITAL SIGNS:  See vs page.  GENERAL: no distress. In wheelchair.   Pulses: foot pulses are intact bilaterally.   MSK: no deformity of the feet or ankles.  CV: 2+ bilat edema of the legs Skin:  no ulcer on the feet or ankles.  normal temp on the feet and ankles. There is severe hyperpigmentation and scaling on the legs and feet.   Neuro: sensation is intact to touch on the feet and ankles.  Ext: There is bilateral onychomycosis of the toenails.   Lab Results  Component Value Date   HGBA1C 9.1 (H) 03/22/2018       Assessment & Plan:  Insulin-requiring type 2 DM: cbg's are variable Hypoglycemia, due to insulin Frail elderly state: he is not a candidate for aggressive glycemic control.  Syncope, new to me.  Apparently hot due to hypoglycemia   Patient Instructions  Please check resident's blood sugar 4 times a day--before the 3 meals, and at bedtime.  also check if you have symptoms of your blood sugar being too high or too low.  please keep a record of the readings and send it with resident to his appointments here.  please call us sooner for cbg below 70.   Please reduce the 70/30 insulin to 45 units with breakfast, and 15 units with supper.   On this type of insulin schedule, resident should eat meals on a regular schedule.  If a meal is missed  or significantly delayed, his blood sugar could go low.  Please come back for a follow-up appointment in 2 months.

## 2018-05-07 ENCOUNTER — Ambulatory Visit: Payer: Medicare Other | Admitting: Internal Medicine

## 2018-05-07 ENCOUNTER — Encounter: Payer: Self-pay | Admitting: Internal Medicine

## 2018-05-07 VITALS — BP 126/52 | HR 64 | Ht 71.0 in | Wt 220.8 lb

## 2018-05-07 DIAGNOSIS — N183 Chronic kidney disease, stage 3 unspecified: Secondary | ICD-10-CM

## 2018-05-07 DIAGNOSIS — I5033 Acute on chronic diastolic (congestive) heart failure: Secondary | ICD-10-CM

## 2018-05-07 DIAGNOSIS — Z79899 Other long term (current) drug therapy: Secondary | ICD-10-CM | POA: Diagnosis not present

## 2018-05-07 DIAGNOSIS — I10 Essential (primary) hypertension: Secondary | ICD-10-CM

## 2018-05-07 NOTE — Progress Notes (Signed)
OFFICE NOTE  Chief Complaint:  Routine follow-up  Primary Care Physician: Gregory Pinto, MD  HPI:  Gregory Hatfield is an 84 year old gentleman who I saw last November of 2012 with a history of hypertension, chronic kidney disease, insulin-dependent diabetes, and dyslipidemia. He has continued to describe no anginal symptoms or worsening shortness of breath. He does have a history of cardiac catheterization in 2005 which was normal, and his main complaints today are left shoulder pain as well as some leg or hip pain. Denies any chest pain, shortness of breath, palpitations, presyncope or syncopal symptoms. He recently reports problems with his hips and says that he may need to have hip replacement. He occasionally has some lower extremity swelling denies any chest pain or worsening shortness of breath.  Gregory Hatfield returns today for preoperative evaluation prior to left hip or placement in August. He is generally asymptomatic and denies any chest pain or shortness of breath. He is able to do some activities although is limited by his hip.  10/31/2016  Gregory Hatfield returns today for follow-up. He was recently hospitalized for profound bradycardia with heart rate in the 30s. It was not clear if he possibly was confused about his medications and he is on carvedilol and diltiazem. Those were held initially and was noted to be hypokalemic. He was also given calcium gluconate in the ER and his junctional rhythm improved to sinus rhythm. There was acute on chronic renal disease with creatinine 2.45 which returned to baseline of 1.9. Since discharge he has had heart rates in the 50s and 60s and apparently has restarted on a lower dose of diltiazem 120 mg daily versus 240 mg daily. He is also on carvedilol 6.25 mg twice a day. He denies any chest pain, shortness of breath or presyncopal or syncopal symptoms. He does get some dizziness. He denies any weakness.  05/07/2018  Gregory Hatfield is seen today  in follow-up.  He recently was hospitalized for UTI and acute on chronic diastolic congestive heart failure associated with that.  This required increasing doses of diuretics.  He also had significant confusion and delirium on top of dementia.  Fortunately this has resolved.  It does not appear he has had any recurrent problems with junctional rhythms.  Heart rate today is normal and blood pressures well controlled.  He denies any shortness of breath.  He does appear somewhat volume overloaded though.  PMHx:  Past Medical History:  Diagnosis Date  . Bradycardia 10/06/2016  . CHF (congestive heart failure) ([place])   . CKD stage 4 due to type 2 diabetes mellitus ([place]) 06/03/2015  . Dyslipidemia associated with type 2 diabetes mellitus ([place]) 05/05/2015  . Essential hypertension 05/05/2015  . IDDM (insulin dependent diabetes mellitus) ([place])   . SDAT (senile dementia of Alzheimer's type) 06/16/2013  . Uncontrolled insulin-dependent diabetes mellitus with renal manifestation ([place]) 05/05/2015    Past Surgical History:  Procedure Laterality Date  . CARDIAC CATHETERIZATION  04/2004   normal r-sided pressure, mild pulm HTN  . CARPAL TUNNEL RELEASE Right   . CERVICAL SPINE SURGERY  1990's  . LUMBAR SPINE SURGERY  2003  . PROSTATE SURGERY  10/2010   transurethral resection   . TOTAL HIP ARTHROPLASTY Left 01/17/2014   Procedure: LEFT TOTAL HIP ARTHROPLASTY;  Surgeon: Yvette Rack., MD;  Location: [facility];  Service: Orthopedics;  Laterality: Left;  . TRANSTHORACIC ECHOCARDIOGRAM  05/2006   EF 60-70%; mild calcif of MV; mild MV regurg; LA mildly dilated  .  TRANSURETHRAL RESECTION OF PROSTATE N/A 08/22/2014   Procedure: TRANSURETHRAL RESECTION OF THE PROSTATE (TURP);  Surgeon: Marcia Brash, MD;  Location: [facility];  Service: Urology;  Laterality: N/A;    FAMHx:  Family History  Problem Relation Age of Onset  . Diabetes Mother   . Hypertension Mother   . Prostate cancer Father   . Heart disease  Father   . Diabetes Brother   . Cancer Brother   . Heart disease Brother   . Diabetes Sister   . Heart disease Sister   . Hyperlipidemia Child   . Hypertension Child   . Diabetes Child   . Heart attack Child   . Cancer Child     SOCHx:   reports that he quit smoking about 34 years ago. He has never used smokeless tobacco. He reports that he does not drink alcohol or use drugs.  ALLERGIES:  Allergies  Allergen Reactions  . Ace Inhibitors Other (See Comments) and Cough    Chronic cough  . Demerol Other (See Comments)    Hard to wake up  . Cosopt [Dorzolamide Hcl-Timolol Mal] Other (See Comments)    Cause heart rate to drop  . Flomax [Tamsulosin Hcl] Other (See Comments)    Low BP  . Morphine And Related Other (See Comments)    hallucinations  . Oxytrol [Oxybutynin] Rash    ROS: Pertinent items noted in HPI and remainder of comprehensive ROS otherwise negative.  HOME MEDS: Current Outpatient Medications  Medication Sig Dispense Refill  . amLODipine (NORVASC) 10 MG tablet Take 1 tablet (10 mg total) by mouth daily. 30 tablet 0  . aspirin 81 MG tablet Take 81 mg by mouth daily.    . B-D UF III MINI PEN NEEDLES 31G X 5 MM MISC USE FOR INJECTIONS AS DIRECTED 100 each 0  . carvedilol (COREG) 6.25 MG tablet TAKE 1 TABLET BY MOUTH TWICE DAILY WITH A MEAL. (Patient taking differently: Take 6.25 mg by mouth 2 (two) times daily with a meal. ) 180 tablet 0  . cholecalciferol (VITAMIN D) 1000 units tablet Take 1,000 Units by mouth daily.    . cloNIDine (CATAPRES - DOSED IN MG/24 HR) 0.2 mg/24hr patch Place 1 patch (0.2 mg total) onto the skin once a week. (Patient taking differently: Place 0.2 mg onto the skin every Friday. ) 4 patch 12  . diclofenac sodium (VOLTAREN) 1 % GEL Apply 4 g topically 4 (four) times daily.     . divalproex (DEPAKOTE SPRINKLE) 125 MG capsule Take 125 mg by mouth 2 (two) times daily.     . feeding supplement, ENSURE ENLIVE, (ENSURE ENLIVE) LIQD Take 237 mLs by  mouth 2 (two) times daily between meals. 237 mL 12  . furosemide (LASIX) 20 MG tablet Take 3 tablets (60 mg total) by mouth daily. 90 tablet 0  . geriatric multivitamins-minerals (ELDERTONIC/GEVRABON) LIQD Take 15 mLs by mouth daily.    . haloperidol (HALDOL) 2 MG tablet Take 1 tablet (2 mg total) by mouth every 4 (four) hours as needed for agitation. 30 tablet 0  . HUMULIN 70/30 (70-30) 100 UNIT/ML injection 45 units with breakfast, and 15 units with supper. 30 mL   . hydrALAZINE (APRESOLINE) 50 MG tablet Take 1 tablet (50 mg total) by mouth every 8 (eight) hours. 90 tablet 0  . latanoprost (XALATAN) 0.005 % ophthalmic solution Place 1 drop into the right eye at bedtime.     Marland Kitchen LEVEMIR FLEXTOUCH 100 UNIT/ML Pen INJECT 50 UNITS IN THE  AM AND 25 UNITS IN THE PM 15 mL 0  . liver oil-zinc oxide (DESITIN) 40 % ointment Apply 1 application topically as needed for irritation.    Marland Kitchen loperamide (IMODIUM A-D) 2 MG tablet Take 2 mg by mouth every 6 (six) hours as needed for diarrhea or loose stools.    Marland Kitchen loratadine (CLARITIN) 10 MG tablet Take 10 mg by mouth daily.    . Multiple Vitamin (MULTIVITAMIN) tablet Take 1 tablet by mouth daily.      Marland Kitchen MYRBETRIQ 50 MG TB24 tablet Take 50 mg by mouth daily.     Marland Kitchen OLANZapine (ZYPREXA) 5 MG tablet Take 1 tablet (5 mg total) by mouth at bedtime. 30 tablet 0  . ondansetron (ZOFRAN) 4 MG tablet Take 1 tablet (4 mg total) by mouth daily as needed for nausea or vomiting. 30 tablet 1  . ONE TOUCH ULTRA TEST test strip CHECK BLOOD SUGAR 6 TIMES A DAY. 100 each 6  . Polyethyl Glycol-Propyl Glycol (SYSTANE OP) Place 1 drop into both eyes every 8 (eight) hours as needed (dry eyes).    . potassium chloride SA (K-DUR,KLOR-CON) 20 MEQ tablet Take 1 tablet (20 mEq total) by mouth daily. 30 tablet 0  . saccharomyces boulardii (FLORASTOR) 250 MG capsule Take 250 mg by mouth daily.    Marland Kitchen senna-docusate (SENOKOT-S) 8.6-50 MG tablet Take 1 tablet by mouth at bedtime.    Marland Kitchen SIMBRINZA 1-0.2  % SUSP Place 1 drop into both eyes 3 (three) times daily.    . simethicone (MYLICON) 80 MG chewable tablet Chew 80 mg by mouth every 6 (six) hours as needed for flatulence.     No current facility-administered medications for this visit.     LABS/IMAGING: No results found for this or any previous visit (from the past 48 hour(s)). No results found.  VITALS: There were no vitals taken for this visit.  EXAM: General appearance: alert and no distress Neck: no carotid bruit, no JVD and thyroid not enlarged, symmetric, no tenderness/mass/nodules Lungs: clear to auscultation bilaterally Heart: regular rate and rhythm, S1, S2 normal, no murmur, click, rub or gallop Abdomen: soft, non-tender; bowel sounds normal; no masses,  no organomegaly Extremities: edema 2+ pitting edema bilaterally in the lower extremity Pulses: 2+ and symmetric Skin: Skin color, texture, turgor normal. No rashes or lesions Neurologic: Grossly normal Psych: Pleasant  EKG: Deferred  ASSESSMENT: 1. Acute on chronic diastolic congestive heart failure 2. Symptomatic bradycardia 3. Type 1 diabetes 4. Hypertension 5. CKD 3 6. Dyslipidemia 7. Dementia  PLAN: 1.   Mr. Pickrel was hospitalized with UTI and acute on chronic diastolic congestive heart failure.  Fortunately his heart rate issues are in the past.  He has required increasing doses of diuretics.  He still carrying 2+ edema.  I recommended further increase of his Lasix to 40 mg twice daily.  We will need to monitor his kidney function as he has known chronic kidney disease stage III.  We will have a repeat metabolic profile in a week and follow-up in about a month.  Pixie Casino, MD, [facility], [place] Director of the Advanced Lipid Disorders &  Cardiovascular Risk Reduction Clinic Diplomate of the American Board of Clinical Lipidology Attending Cardiologist  Direct Dial: [phone]  Fax: [phone]  Website:   [email or web address] 05/07/2018, 8:51 AM

## 2018-05-07 NOTE — Patient Instructions (Signed)
Medication Instructions:  TAKE furosemide (lasix) 40mg  twice daily  If you need a refill on your cardiac medications before your next appointment, please call your pharmacy.   Lab work: BMET - non-fasting lab work in [place] If you have labs (blood work) drawn today and your tests are completely normal, you will receive your results only by: Marland Kitchen MyChart Message (if you have MyChart) OR . A paper copy in the mail If you have any lab test that is abnormal or we need to change your treatment, we will call you to review the results.  Testing/Procedures: NONE  Follow-Up: At [facility], you and your health needs are our priority.  As part of our continuing mission to provide you with exceptional heart care, we have created designated Provider Care Teams.  These Care Teams include your primary Cardiologist (physician) and Advanced Practice Providers (APPs -  Physician Assistants and Nurse Practitioners) who all work together to provide you with the care you need, when you need it. You will need a follow up appointment in 4-6 weeks.  Please call our office 2 months in advance to schedule this appointment.  You may see Dr. Debara Pickett or one of the following Advanced Practice Providers on your designated Care Team: Almyra Deforest, [place] . Fabian Sharp, PA-C  Any Other Special Instructions Will Be Listed Below (If Applicable).

## 2018-05-14 ENCOUNTER — Telehealth: Payer: Self-pay | Admitting: *Deleted

## 2018-05-14 ENCOUNTER — Other Ambulatory Visit: Payer: Self-pay | Admitting: Internal Medicine

## 2018-05-14 NOTE — Telephone Encounter (Signed)
After receiving a fax in regard to the patient's low glucose numbers, a call was made to the nurse at [phone] ) regarding the transfer of care to the house doctor at the facility.  Per the nurse, the patient's family has requested the house doctor see the patient, but diabetes related information should go to Dr Loanne Drilling. She will advise the staff of the change.

## 2018-05-21 ENCOUNTER — Telehealth: Payer: Self-pay | Admitting: Endocrinology

## 2018-05-21 ENCOUNTER — Other Ambulatory Visit: Payer: Self-pay

## 2018-05-21 DIAGNOSIS — E1122 Type 2 diabetes mellitus with diabetic chronic kidney disease: Secondary | ICD-10-CM

## 2018-05-21 DIAGNOSIS — Z794 Long term (current) use of insulin: Principal | ICD-10-CM

## 2018-05-21 DIAGNOSIS — N183 Chronic kidney disease, stage 3 (moderate): Principal | ICD-10-CM

## 2018-05-21 MED ORDER — HUMULIN 70/30 (70-30) 100 UNIT/ML ~~LOC~~ SUSP: SUBCUTANEOUS | 0 refills | Status: DC

## 2018-05-21 NOTE — Telephone Encounter (Signed)
From Dr. Loanne Drilling:  please call [name]: Please reduce the 70/30 insulin to 44 units with breakfast, and 14 units with supper.  Gregory Hatfield will not take verbal orders. Order written and faxed to [facility], Attention: Shawna @ [phone] with confirmation received.

## 2018-05-21 NOTE — Telephone Encounter (Signed)
This has been addressed in a separate encounter

## 2018-05-21 NOTE — Telephone Encounter (Signed)
please call [name]: Please reduce the 70/30 insulin to 44 units with breakfast, and 14 units with supper.

## 2018-07-03 ENCOUNTER — Encounter: Payer: Self-pay | Admitting: Internal Medicine

## 2018-07-03 ENCOUNTER — Ambulatory Visit: Payer: Medicare Other | Admitting: Internal Medicine

## 2018-07-03 VITALS — BP 112/50 | HR 57 | Ht 71.0 in | Wt 221.0 lb

## 2018-07-03 DIAGNOSIS — N183 Chronic kidney disease, stage 3 unspecified: Secondary | ICD-10-CM

## 2018-07-03 DIAGNOSIS — R001 Bradycardia, unspecified: Secondary | ICD-10-CM | POA: Diagnosis not present

## 2018-07-03 DIAGNOSIS — I5033 Acute on chronic diastolic (congestive) heart failure: Secondary | ICD-10-CM | POA: Diagnosis not present

## 2018-07-03 LAB — BASIC METABOLIC PANEL
BUN/Creatinine Ratio: 13 (ref 10–24)
BUN: 29 mg/dL (ref 10–36)
CALCIUM: 9.3 mg/dL (ref 8.6–10.2)
CHLORIDE: 103 mmol/L (ref 96–106)
CO2: 23 mmol/L (ref 20–29)
Creatinine, Ser: 2.2 mg/dL — ABNORMAL HIGH (ref 0.76–1.27)
GFR, EST AFRICAN AMERICAN: 29 mL/min/{1.73_m2} — AB (ref >59)
GFR, EST NON AFRICAN AMERICAN: 25 mL/min/{1.73_m2} — AB (ref >59)
Glucose: 245 mg/dL — ABNORMAL HIGH (ref 65–99)
POTASSIUM: 4 mmol/L (ref 3.5–5.2)
Sodium: 142 mmol/L (ref 134–144)

## 2018-07-03 NOTE — Addendum Note (Signed)
Addended by: Pixie Casino on: 07/03/2018 05:28 PM   Modules accepted: Level of Service

## 2018-07-03 NOTE — Progress Notes (Addendum)
OFFICE NOTE  Chief Complaint:  Follow-up heart failure  Primary Care Physician: [name], MD  HPI:  Gregory Hatfield is an 85 year old gentleman who I saw last November of 2012 with a history of hypertension, chronic kidney disease, insulin-dependent diabetes, and dyslipidemia. He has continued to describe no anginal symptoms or worsening shortness of breath. He does have a history of cardiac catheterization in 2005 which was normal, and his main complaints today are left shoulder pain as well as some leg or hip pain. Denies any chest pain, shortness of breath, palpitations, presyncope or syncopal symptoms. He recently reports problems with his hips and says that he may need to have hip replacement. He occasionally has some lower extremity swelling denies any chest pain or worsening shortness of breath.  Gregory Hatfield returns today for preoperative evaluation prior to left hip or placement in August. He is generally asymptomatic and denies any chest pain or shortness of breath. He is able to do some activities although is limited by his hip.  10/31/2016  Gregory Hatfield returns today for follow-up. He was recently hospitalized for profound bradycardia with heart rate in the 30s. It was not clear if he possibly was confused about his medications and he is on carvedilol and diltiazem. Those were held initially and was noted to be hypokalemic. He was also given calcium gluconate in the ER and his junctional rhythm improved to sinus rhythm. There was acute on chronic renal disease with creatinine 2.45 which returned to baseline of 1.9. Since discharge he has had heart rates in the 50s and 60s and apparently has restarted on a lower dose of diltiazem 120 mg daily versus 240 mg daily. He is also on carvedilol 6.25 mg twice a day. He denies any chest pain, shortness of breath or presyncopal or syncopal symptoms. He does get some dizziness. He denies any weakness.  05/07/2018  Gregory Hatfield is seen  today in follow-up.  He recently was hospitalized for UTI and acute on chronic diastolic congestive heart failure associated with that.  This required increasing doses of diuretics.  He also had significant confusion and delirium on top of dementia.  Fortunately this has resolved.  It does not appear he has had any recurrent problems with junctional rhythms.  Heart rate today is normal and blood pressures well controlled.  He denies any shortness of breath.  He does appear somewhat volume overloaded though.  07/03/2018  Gregory Hatfield returns today for follow-up.  When I last saw him he continued to appear volume overloaded.  I increased his Lasix from 40 mg daily to 40 mg twice daily.  He reports a stable weight without any significant worsening of his edema.  He has not had repeat lab testing.  Fortunately he is managed to stay out of the hospital the past 3 months.  He was previously seen by nephrology however they did not feel that he needed further follow-up.  We will plan to get a repeat metabolic profile today.  PMHx:  Past Medical History:  Diagnosis Date  . Bradycardia 10/06/2016  . CHF (congestive heart failure) ([place])   . CKD stage 4 due to type 2 diabetes mellitus ([place]) 06/03/2015  . Dyslipidemia associated with type 2 diabetes mellitus ([place]) 05/05/2015  . Essential hypertension 05/05/2015  . IDDM (insulin dependent diabetes mellitus) ([place])   . SDAT (senile dementia of Alzheimer's type) 06/16/2013  . Uncontrolled insulin-dependent diabetes mellitus with renal manifestation ([place]) 05/05/2015    Past Surgical History:  Procedure Laterality Date  . CARDIAC CATHETERIZATION  04/2004   normal r-sided pressure, mild pulm HTN  . CARPAL TUNNEL RELEASE Right   . CERVICAL SPINE SURGERY  1990's  . LUMBAR SPINE SURGERY  2003  . PROSTATE SURGERY  10/2010   transurethral resection   . TOTAL HIP ARTHROPLASTY Left 01/17/2014   Procedure: LEFT TOTAL HIP ARTHROPLASTY;  Surgeon: Yvette Rack., MD;   Location: [facility];  Service: Orthopedics;  Laterality: Left;  . TRANSTHORACIC ECHOCARDIOGRAM  05/2006   EF 60-70%; mild calcif of MV; mild MV regurg; LA mildly dilated  . TRANSURETHRAL RESECTION OF PROSTATE N/A 08/22/2014   Procedure: TRANSURETHRAL RESECTION OF THE PROSTATE (TURP);  Surgeon: Marcia Brash, MD;  [facility];  Service: Urology;  Laterality: N/A;    FAMHx:  Family History  Problem Relation Age of Onset  . Diabetes Mother   . Hypertension Mother   . Prostate cancer Father   . Heart disease Father   . Diabetes Brother   . Cancer Brother   . Heart disease Brother   . Diabetes Sister   . Heart disease Sister   . Hyperlipidemia Child   . Hypertension Child   . Diabetes Child   . Heart attack Child   . Cancer Child     SOCHx:   reports that he quit smoking about 35 years ago. He has never used smokeless tobacco. He reports that he does not drink alcohol or use drugs.  ALLERGIES:  Allergies  Allergen Reactions  . Ace Inhibitors Other (See Comments) and Cough    Chronic cough  . Demerol Other (See Comments)    Hard to wake up  . Cosopt [Dorzolamide Hcl-Timolol Mal] Other (See Comments)    Cause heart rate to drop  . Flomax [Tamsulosin Hcl] Other (See Comments)    Low BP  . Morphine And Related Other (See Comments)    hallucinations  . Oxytrol [Oxybutynin] Rash    ROS: Pertinent items noted in HPI and remainder of comprehensive ROS otherwise negative.  HOME MEDS: Current Outpatient Medications  Medication Sig Dispense Refill  . amLODipine (NORVASC) 10 MG tablet Take 1 tablet (10 mg total) by mouth daily. 30 tablet 0  . aspirin 81 MG tablet Take 81 mg by mouth daily.    . B-D UF III MINI PEN NEEDLES 31G X 5 MM MISC USE FOR INJECTIONS AS DIRECTED 100 each 0  . carvedilol (COREG) 6.25 MG tablet TAKE 1 TABLET BY MOUTH TWICE DAILY WITH A MEAL. (Patient taking differently: Take 6.25 mg by mouth 2 (two) times daily with a meal. ) 180 tablet 0  .  cholecalciferol (VITAMIN D3) 25 MCG (1000 UT) tablet Take 1,000 Units by mouth daily.    . cloNIDine (CATAPRES - DOSED IN MG/24 HR) 0.2 mg/24hr patch Place 1 patch (0.2 mg total) onto the skin once a week. (Patient taking differently: Place 0.2 mg onto the skin every Friday. ) 4 patch 12  . diclofenac sodium (VOLTAREN) 1 % GEL Apply 4 g topically 4 (four) times daily.     . divalproex (DEPAKOTE SPRINKLE) 125 MG capsule Take 125 mg by mouth 2 (two) times daily.     . feeding supplement, ENSURE ENLIVE, (ENSURE ENLIVE) LIQD Take 237 mLs by mouth 2 (two) times daily between meals. 237 mL 12  . furosemide (LASIX) 20 MG tablet Take 40 mg by mouth 2 (two) times daily.    Marland Kitchen geriatric multivitamins-minerals (ELDERTONIC/GEVRABON) LIQD Take 15 mLs by mouth  daily.    . haloperidol (HALDOL) 2 MG tablet Take 1 tablet (2 mg total) by mouth every 4 (four) hours as needed for agitation. 30 tablet 0  . HUMULIN 70/30 (70-30) 100 UNIT/ML injection 44 units with breakfast, and 14 units with supper. 30 mL 0  . hydrALAZINE (APRESOLINE) 50 MG tablet Take 1 tablet (50 mg total) by mouth every 8 (eight) hours. 90 tablet 0  . latanoprost (XALATAN) 0.005 % ophthalmic solution Place 1 drop into the right eye at bedtime.     Marland Kitchen LEVEMIR FLEXTOUCH 100 UNIT/ML Pen INJECT 50 UNITS IN THE AM AND 25 UNITS IN THE PM 15 mL 0  . liver oil-zinc oxide (DESITIN) 40 % ointment Apply 1 application topically as needed for irritation.    Marland Kitchen loperamide (IMODIUM A-D) 2 MG tablet Take 2 mg by mouth every 6 (six) hours as needed for diarrhea or loose stools.    Marland Kitchen loratadine (CLARITIN) 10 MG tablet Take 10 mg by mouth daily.    . Multiple Vitamin (MULTIVITAMIN) tablet Take 1 tablet by mouth daily.      Marland Kitchen MYRBETRIQ 50 MG TB24 tablet Take 50 mg by mouth daily.     Marland Kitchen OLANZapine (ZYPREXA) 5 MG tablet Take 1 tablet (5 mg total) by mouth at bedtime. 30 tablet 0  . ondansetron (ZOFRAN) 4 MG tablet Take 1 tablet (4 mg total) by mouth daily as needed for  nausea or vomiting. 30 tablet 1  . ONE TOUCH ULTRA TEST test strip CHECK BLOOD SUGAR 6 TIMES A DAY. 100 each 6  . Polyethyl Glycol-Propyl Glycol (SYSTANE OP) Place 1 drop into both eyes every 8 (eight) hours as needed (dry eyes).    . potassium chloride SA (K-DUR,KLOR-CON) 20 MEQ tablet Take 1 tablet (20 mEq total) by mouth daily. 30 tablet 0  . promethazine (PHENERGAN) 12.5 MG suppository Place 12.5 mg rectally every 6 (six) hours as needed for nausea or vomiting.    . saccharomyces boulardii (FLORASTOR) 250 MG capsule Take 250 mg by mouth daily.    Marland Kitchen senna-docusate (SENOKOT-S) 8.6-50 MG tablet Take 1 tablet by mouth at bedtime.    Marland Kitchen SIMBRINZA 1-0.2 % SUSP Place 1 drop into both eyes 3 (three) times daily.    . simethicone (MYLICON) 80 MG chewable tablet Chew 80 mg by mouth every 6 (six) hours as needed for flatulence.    . traMADol (ULTRAM) 50 MG tablet Take 50 mg by mouth every 6 (six) hours as needed.     No current facility-administered medications for this visit.     LABS/IMAGING: No results found for this or any previous visit (from the past 48 hour(s)). No results found.  VITALS: BP (!) 112/50 (BP Location: Left Arm, Patient Position: Sitting, Cuff Size: Normal)   Pulse (!) 57   Ht 5\' 11"  (1.803 m)   Wt 221 lb (100.2 kg)   BMI 30.82 kg/m   EXAM: General appearance: alert and no distress Neck: no carotid bruit, no JVD and thyroid not enlarged, symmetric, no tenderness/mass/nodules Lungs: clear to auscultation bilaterally Heart: regular rate and rhythm, S1, S2 normal, no murmur, click, rub or gallop Abdomen: soft, non-tender; bowel sounds normal; no masses,  no organomegaly Extremities: edema 1+ pitting edema bilaterally in the lower extremity Pulses: 2+ and symmetric Skin: Skin color, texture, turgor normal. No rashes or lesions Neurologic: Grossly normal Psych: Pleasant  EKG: Sinus bradycardia with first-degree AV block and PACs, LBBB at 57-personally  reviewed  ASSESSMENT: 1. Acute on  chronic diastolic congestive heart failure 2. Symptomatic bradycardia 3. Type 1 diabetes 4. Hypertension 5. CKD 3 6. Dyslipidemia 7. Dementia  PLAN: 1.   Gregory Hatfield has had mild improvement in his edema however weight is apparently stable.  A repeat metabolic profile today shows a rising creatinine from 1.8-2.2 however his volume status appears to be stable and therefore we may have to accept a higher creatinine at this dose of diuretic.  We will plan follow-up in 3 to 6 months.  Pixie Casino, MD, [facility], [place] Director of the Advanced Lipid Disorders &  Cardiovascular Risk Reduction Clinic Diplomate of the American Board of Clinical Lipidology Attending Cardiologist  Direct Dial: [phone]  Fax: [phone]  Website:  [email or web address] 07/03/2018, 9:32 AM

## 2018-07-03 NOTE — Patient Instructions (Signed)
Medication Instructions:  NO CHANGES If you need a refill on your cardiac medications before your next appointment, please call your pharmacy.   Lab work: BMET today If you have labs (blood work) drawn today and your tests are completely normal, you will receive your results only by: Marland Kitchen MyChart Message (if you have MyChart) OR . A paper copy in the mail If you have any lab test that is abnormal or we need to change your treatment, we will call you to review the results.  Follow-Up: At [facility], you and your health needs are our priority.  As part of our continuing mission to provide you with exceptional heart care, we have created designated Provider Care Teams.  These Care Teams include your primary Cardiologist (physician) and Advanced Practice Providers (APPs -  Physician Assistants and Nurse Practitioners) who all work together to provide you with the care you need, when you need it. You will need a follow up appointment in 6 months.  Please call our office 2 months in advance to schedule this appointment.  You may see Dr. Debara Pickett or one of the following Advanced Practice Providers on your designated Care Team: Almyra Deforest, [place] . Fabian Sharp, PA-C  Any Other Special Instructions Will Be Listed Below (If Applicable).

## 2018-07-05 ENCOUNTER — Ambulatory Visit: Payer: Medicare Other | Admitting: Endocrinology

## 2018-07-05 ENCOUNTER — Encounter: Payer: Self-pay | Admitting: Endocrinology

## 2018-07-05 VITALS — BP 124/60 | HR 55 | Ht 71.0 in | Wt 220.4 lb

## 2018-07-05 DIAGNOSIS — Z794 Long term (current) use of insulin: Secondary | ICD-10-CM

## 2018-07-05 DIAGNOSIS — N183 Chronic kidney disease, stage 3 (moderate): Secondary | ICD-10-CM

## 2018-07-05 DIAGNOSIS — E1122 Type 2 diabetes mellitus with diabetic chronic kidney disease: Secondary | ICD-10-CM | POA: Diagnosis not present

## 2018-07-05 LAB — POCT GLYCOSYLATED HEMOGLOBIN (HGB A1C): Hemoglobin A1C: 7.8 % — AB (ref 4.0–5.6)

## 2018-07-05 NOTE — Progress Notes (Signed)
Subjective:    Patient ID: Gregory Hatfield, male    DOB: 02/22/1926, 85 y.o.   MRN: [id number]  HPI Pt returns for f/u of diabetes mellitus: DM type: Insulin-requiring type 2 Dx'ed: 0539 Complications: renal insuff, retinopathy, CAD, and CHF.   Therapy: insulin since 2003 DKA: never Severe hypoglycemia: never.   Pancreatitis: never. Other: he is on a BID insulin regimen, due to noncompliance with multiple daily injections.   Interval history: He is in [place] assist living. He is not sure how long he expects to be there. Staff there gives insulin, and checks cbg's.  Med list is reviewed.  he takes 70/30 insulin, 44 units with breakfast, and 14 units with supper. He brings a record of his cbg's which I have reviewed today.  It varies from 73-245, but most are in the 100's.  There is no trend throughout the day. pt states he feels well in general.   Past Medical History:  Diagnosis Date  . Bradycardia 10/06/2016  . CHF (congestive heart failure) ([place])   . CKD stage 4 due to type 2 diabetes mellitus ([place]) 06/03/2015  . Dyslipidemia associated with type 2 diabetes mellitus ([place]) 05/05/2015  . Essential hypertension 05/05/2015  . IDDM (insulin dependent diabetes mellitus) ([place])   . SDAT (senile dementia of Alzheimer's type) 06/16/2013  . Uncontrolled insulin-dependent diabetes mellitus with renal manifestation ([place]) 05/05/2015    Past Surgical History:  Procedure Laterality Date  . CARDIAC CATHETERIZATION  04/2004   normal r-sided pressure, mild pulm HTN  . CARPAL TUNNEL RELEASE Right   . CERVICAL SPINE SURGERY  1990's  . LUMBAR SPINE SURGERY  2003  . PROSTATE SURGERY  10/2010   transurethral resection   . TOTAL HIP ARTHROPLASTY Left 01/17/2014   Procedure: LEFT TOTAL HIP ARTHROPLASTY;  Surgeon: Yvette Rack., MD;  Location: [place];  Service: Orthopedics;  Laterality: Left;  . TRANSTHORACIC ECHOCARDIOGRAM  05/2006   EF 60-70%; mild calcif of MV; mild MV regurg; LA mildly  dilated  . TRANSURETHRAL RESECTION OF PROSTATE N/A 08/22/2014   Procedure: TRANSURETHRAL RESECTION OF THE PROSTATE (TURP);  Surgeon: Marcia Brash, MD;  Location: [facility];  Service: Urology;  Laterality: N/A;    Social History   Socioeconomic History  . Marital status: Widowed    Spouse name: Not on file  . Number of children: 5  . Years of education: master's  . Highest education level: Not on file  Occupational History  . Occupation: Therapist, music: RETIRED  Social Needs  . Financial resource strain: Not on file  . Food insecurity:    Worry: Not on file    Inability: Not on file  . Transportation needs:    Medical: Not on file    Non-medical: Not on file  Tobacco Use  . Smoking status: Former Smoker    Last attempt to quit: 06/21/1983    Years since quitting: 35.0  . Smokeless tobacco: Never Used  Substance and Sexual Activity  . Alcohol use: No  . Drug use: No  . Sexual activity: Never  Lifestyle  . Physical activity:    Days per week: Not on file    Minutes per session: Not on file  . Stress: Not on file  Relationships  . Social connections:    Talks on phone: Not on file    Gets together: Not on file    Attends religious service: Not on file    Active member of club or organization:  Not on file    Attends meetings of clubs or organizations: Not on file    Relationship status: Not on file  . Intimate partner violence:    Fear of current or ex partner: Not on file    Emotionally abused: Not on file    Physically abused: Not on file    Forced sexual activity: Not on file  Other Topics Concern  . Not on file  Social History Narrative  . Not on file    Current Outpatient Medications on File Prior to Visit  Medication Sig Dispense Refill  . amLODipine (NORVASC) 10 MG tablet Take 1 tablet (10 mg total) by mouth daily. 30 tablet 0  . aspirin 81 MG tablet Take 81 mg by mouth daily.    . B-D UF III MINI PEN NEEDLES 31G X 5 MM MISC USE FOR  INJECTIONS AS DIRECTED 100 each 0  . carvedilol (COREG) 6.25 MG tablet TAKE 1 TABLET BY MOUTH TWICE DAILY WITH A MEAL. (Patient taking differently: Take 6.25 mg by mouth 2 (two) times daily with a meal. ) 180 tablet 0  . cholecalciferol (VITAMIN D3) 25 MCG (1000 UT) tablet Take 1,000 Units by mouth daily.    . cloNIDine (CATAPRES - DOSED IN MG/24 HR) 0.2 mg/24hr patch Place 1 patch (0.2 mg total) onto the skin once a week. (Patient taking differently: Place 0.2 mg onto the skin every Friday. ) 4 patch 12  . diclofenac sodium (VOLTAREN) 1 % GEL Apply 4 g topically 4 (four) times daily.     . divalproex (DEPAKOTE SPRINKLE) 125 MG capsule Take 125 mg by mouth 2 (two) times daily.     . furosemide (LASIX) 20 MG tablet Take 40 mg by mouth 2 (two) times daily.    Marland Kitchen geriatric multivitamins-minerals (ELDERTONIC/GEVRABON) LIQD Take 15 mLs by mouth daily.    . haloperidol (HALDOL) 2 MG tablet Take 1 tablet (2 mg total) by mouth every 4 (four) hours as needed for agitation. 30 tablet 0  . HUMULIN 70/30 (70-30) 100 UNIT/ML injection 44 units with breakfast, and 14 units with supper. 30 mL 0  . hydrALAZINE (APRESOLINE) 50 MG tablet Take 1 tablet (50 mg total) by mouth every 8 (eight) hours. 90 tablet 0  . latanoprost (XALATAN) 0.005 % ophthalmic solution Place 1 drop into the right eye at bedtime.     Marland Kitchen liver oil-zinc oxide (DESITIN) 40 % ointment Apply 1 application topically as needed for irritation.    Marland Kitchen loperamide (IMODIUM A-D) 2 MG tablet Take 2 mg by mouth every 6 (six) hours as needed for diarrhea or loose stools.    Marland Kitchen loratadine (CLARITIN) 10 MG tablet Take 10 mg by mouth daily.    . Multiple Vitamin (MULTIVITAMIN) tablet Take 1 tablet by mouth daily.      Marland Kitchen MYRBETRIQ 50 MG TB24 tablet Take 50 mg by mouth daily.     Marland Kitchen OLANZapine (ZYPREXA) 5 MG tablet Take 1 tablet (5 mg total) by mouth at bedtime. 30 tablet 0  . ondansetron (ZOFRAN) 4 MG tablet Take 1 tablet (4 mg total) by mouth daily as needed for  nausea or vomiting. 30 tablet 1  . ONE TOUCH ULTRA TEST test strip CHECK BLOOD SUGAR 6 TIMES A DAY. 100 each 6  . Polyethyl Glycol-Propyl Glycol (SYSTANE OP) Place 1 drop into both eyes every 8 (eight) hours as needed (dry eyes).    . potassium chloride SA (K-DUR,KLOR-CON) 20 MEQ tablet Take 1 tablet (20 mEq total)  by mouth daily. 30 tablet 0  . saccharomyces boulardii (FLORASTOR) 250 MG capsule Take 250 mg by mouth daily.    Marland Kitchen senna-docusate (SENOKOT-S) 8.6-50 MG tablet Take 1 tablet by mouth at bedtime.    Marland Kitchen SIMBRINZA 1-0.2 % SUSP Place 1 drop into both eyes 3 (three) times daily.    . simethicone (MYLICON) 80 MG chewable tablet Chew 80 mg by mouth every 6 (six) hours as needed for flatulence.    . feeding supplement, ENSURE ENLIVE, (ENSURE ENLIVE) LIQD Take 237 mLs by mouth 2 (two) times daily between meals. 237 mL 12  . promethazine (PHENERGAN) 12.5 MG suppository Place 12.5 mg rectally every 6 (six) hours as needed for nausea or vomiting.    . traMADol (ULTRAM) 50 MG tablet Take 50 mg by mouth every 6 (six) hours as needed.     No current facility-administered medications on file prior to visit.     Allergies  Allergen Reactions  . Ace Inhibitors Other (See Comments) and Cough    Chronic cough  . Demerol Other (See Comments)    Hard to wake up  . Cosopt [Dorzolamide Hcl-Timolol Mal] Other (See Comments)    Cause heart rate to drop  . Flomax [Tamsulosin Hcl] Other (See Comments)    Low BP  . Morphine And Related Other (See Comments)    hallucinations  . Oxytrol [Oxybutynin] Rash    Family History  Problem Relation Age of Onset  . Diabetes Mother   . Hypertension Mother   . Prostate cancer Father   . Heart disease Father   . Diabetes Brother   . Cancer Brother   . Heart disease Brother   . Diabetes Sister   . Heart disease Sister   . Hyperlipidemia Child   . Hypertension Child   . Diabetes Child   . Heart attack Child   . Cancer Child     BP 124/60 (BP Location:  Left Arm, Patient Position: Sitting, Cuff Size: Large)   Pulse (!) 55   Ht 5\' 11"  (1.803 m)   Wt 220 lb 6.4 oz (100 kg)   SpO2 97%   BMI 30.74 kg/m    Review of Systems He denies hypoglycemia    Objective:   Physical Exam VITAL SIGNS:  See vs page GENERAL: no distress Pulses: dorsalis pedis intact bilat.   MSK: no deformity of the feet CV: 2+ bilat leg leg edema Skin:  no ulcer on the feet, but the skin is dry and scaly.  normal color and temp on the feet. Neuro: sensation is intact to touch on the feet Ext: There is bilateral onychomycosis of the toenails.    Lab Results  Component Value Date   CREATININE 2.20 (H) 07/03/2018   BUN 29 07/03/2018   NA 142 07/03/2018   K 4.0 07/03/2018   CL 103 07/03/2018   CO2 23 07/03/2018        Assessment & Plan:  Insulin-requiring type 2 DM, with CAD.  Renal failure: in this context, he needs mostly am insulin.  Frail elderly state: he is not a candidate for aggressive glycemic control.  Patient Instructions  Please check resident's blood sugar 4 times a day--before the 3 meals, and at bedtime.  also check if you have symptoms of your blood sugar being too high or too low.  please keep a record of the readings and send it with resident to his appointments here.  please call us sooner for cbg below 70.   Please  reduce the 70/30 insulin to 43 units with breakfast, and 13 units with supper.   On this type of insulin schedule, resident should eat meals on a regular schedule.  If a meal is missed or significantly delayed, his blood sugar could go low.  Please come back for a follow-up appointment in 2 months.

## 2018-07-05 NOTE — Patient Instructions (Addendum)
Please check resident's blood sugar 4 times a day--before the 3 meals, and at bedtime.  also check if you have symptoms of your blood sugar being too high or too low.  please keep a record of the readings and send it with resident to his appointments here.  please call us sooner for cbg below 70.   Please reduce the 70/30 insulin to 43 units with breakfast, and 13 units with supper.   On this type of insulin schedule, resident should eat meals on a regular schedule.  If a meal is missed or significantly delayed, his blood sugar could go low.  Please come back for a follow-up appointment in 2 months.

## 2018-07-18 ENCOUNTER — Ambulatory Visit: Payer: Medicare Other | Admitting: Endocrinology

## 2018-08-06 ENCOUNTER — Other Ambulatory Visit: Payer: Self-pay

## 2018-08-06 DIAGNOSIS — N183 Chronic kidney disease, stage 3 (moderate): Principal | ICD-10-CM

## 2018-08-06 DIAGNOSIS — Z794 Long term (current) use of insulin: Principal | ICD-10-CM

## 2018-08-06 DIAGNOSIS — E1122 Type 2 diabetes mellitus with diabetic chronic kidney disease: Secondary | ICD-10-CM

## 2018-08-06 MED ORDER — HUMULIN 70/30 (70-30) 100 UNIT/ML ~~LOC~~ SUSP: SUBCUTANEOUS | 0 refills | Status: DC

## 2018-08-07 ENCOUNTER — Telehealth: Payer: Self-pay | Admitting: Endocrinology

## 2018-08-07 ENCOUNTER — Other Ambulatory Visit: Payer: Self-pay

## 2018-08-07 DIAGNOSIS — E1122 Type 2 diabetes mellitus with diabetic chronic kidney disease: Secondary | ICD-10-CM

## 2018-08-07 DIAGNOSIS — N183 Chronic kidney disease, stage 3 unspecified: Secondary | ICD-10-CM

## 2018-08-07 DIAGNOSIS — Z794 Long term (current) use of insulin: Principal | ICD-10-CM

## 2018-08-07 MED ORDER — HUMULIN 70/30 (70-30) 100 UNIT/ML ~~LOC~~ SUSP: SUBCUTANEOUS | 0 refills | Status: DC

## 2018-08-07 NOTE — Telephone Encounter (Signed)
please contact patient's facility: I reviewed cbg's Please reduce insulin to 42 units with breakfast, and 13 units with supper.

## 2018-08-07 NOTE — Telephone Encounter (Signed)
New orders faxed to [facility] with confirmation received

## 2018-09-13 ENCOUNTER — Other Ambulatory Visit: Payer: Self-pay

## 2018-09-18 ENCOUNTER — Ambulatory Visit: Payer: Medicare Other | Admitting: Endocrinology

## 2018-09-26 ENCOUNTER — Telehealth: Payer: Self-pay

## 2018-09-26 NOTE — Telephone Encounter (Signed)
Letter faxed to [facility] indicating Dr. Loanne Drilling reviewed pt vitals and made no changes to pt current regimen. Records have been placed labeled and sent to HIM for scanning purposes and future reference.

## 2018-10-19 ENCOUNTER — Ambulatory Visit: Payer: Medicare Other | Admitting: Endocrinology

## 2018-10-22 ENCOUNTER — Telehealth: Payer: Self-pay

## 2018-10-22 NOTE — Telephone Encounter (Signed)
Received CBG readings from [facility]. Per Dr. Loanne Drilling, pt will require virtual visit with dtr or nursing staff to further discuss. Called to schedule appt. LVM requesting returned call.

## 2018-10-24 ENCOUNTER — Encounter: Payer: Self-pay | Admitting: Endocrinology

## 2018-10-25 ENCOUNTER — Ambulatory Visit (INDEPENDENT_AMBULATORY_CARE_PROVIDER_SITE_OTHER): Payer: Medicare Other | Admitting: Endocrinology

## 2018-10-25 DIAGNOSIS — N183 Chronic kidney disease, stage 3 (moderate): Secondary | ICD-10-CM

## 2018-10-25 DIAGNOSIS — E1122 Type 2 diabetes mellitus with diabetic chronic kidney disease: Secondary | ICD-10-CM | POA: Diagnosis not present

## 2018-10-25 DIAGNOSIS — Z794 Long term (current) use of insulin: Secondary | ICD-10-CM

## 2018-10-25 DIAGNOSIS — E119 Type 2 diabetes mellitus without complications: Secondary | ICD-10-CM

## 2018-10-25 MED ORDER — HUMULIN 70/30 (70-30) 100 UNIT/ML ~~LOC~~ SUSP: SUBCUTANEOUS | 0 refills | Status: DC

## 2018-10-25 NOTE — Patient Instructions (Addendum)
Change the insulin to 44 units with breakfast, and 10 units with supper. This new change can begin BEFORE drawing his A1C. Please draw an A1C at the next lab draw. Send results to [phone].  Dx code: E11.9.   Check your blood sugar 4 times a day.  vary the time of day when you check, between before the 3 meals, and at bedtime.  also check if you have symptoms of your blood sugar being too high or too low.  please keep a record of the readings and bring it to your next appointment here (or you can bring the meter itself).  You can write it on any piece of paper.  please call us sooner if your blood sugar goes below 70, or if you have a lot of readings over 200. Please have a a follow-up appointment in 3 months.

## 2018-10-25 NOTE — Progress Notes (Signed)
Subjective:    Patient ID: Gregory Hatfield, male    DOB: [birth year], 85 y.o.   MRN: [id number]  HPI telehealth visit today via doxy video visit.  Alternatives to telehealth are presented to this patient, and the patient agrees to the telehealth visit. Pt is advised of the cost of the visit, and agrees to this, also.   Patient is at [place], and I am at the office.   Persons attending the telehealth visit: the patient, RN at [facility]), and I Pt returns for f/u of diabetes mellitus: DM type: Insulin-requiring type 2 Dx'ed: 4259 Complications: renal insuff, retinopathy, CAD, and CHF.   Therapy: insulin since 2003 DKA: never Severe hypoglycemia: never.   Pancreatitis: never. Other: he is on a BID insulin regimen, due to noncompliance with multiple daily injections.   Interval history: Was in hospital a few mos ago, with syncope, and other problems.  pt states he feels well in general.  Meals are brought to his room.  Ms. Felipa Evener says pt's appetite is good.  He brings a record of his cbg's which I have reviewed today.  cbg varies from 69-278.  It is in general higher as the day goes on.  Ms Felipa Evener verifies insulin dosage: humulin 70/30 insulin, 42 units with breakfast, and 13 units with the evening meal.   Past Medical History:  Diagnosis Date  . Bradycardia 10/06/2016  . CHF (congestive heart failure) ([place])   . CKD stage 4 due to type 2 diabetes mellitus ([place]) 06/03/2015  . Dyslipidemia associated with type 2 diabetes mellitus ([place]) 05/05/2015  . Essential hypertension 05/05/2015  . IDDM (insulin dependent diabetes mellitus) ([place])   . SDAT (senile dementia of Alzheimer's type) 06/16/2013  . Uncontrolled insulin-dependent diabetes mellitus with renal manifestation ([place]) 05/05/2015    Past Surgical History:  Procedure Laterality Date  . CARDIAC CATHETERIZATION  04/2004   normal r-sided pressure, mild pulm HTN  . CARPAL TUNNEL RELEASE Right   . CERVICAL SPINE  SURGERY  1990's  . LUMBAR SPINE SURGERY  2003  . PROSTATE SURGERY  10/2010   transurethral resection   . TOTAL HIP ARTHROPLASTY Left 01/17/2014   Procedure: LEFT TOTAL HIP ARTHROPLASTY;  Surgeon: Yvette Rack., MD;  Location: [place];  Service: Orthopedics;  Laterality: Left;  . TRANSTHORACIC ECHOCARDIOGRAM  05/2006   EF 60-70%; mild calcif of MV; mild MV regurg; LA mildly dilated  . TRANSURETHRAL RESECTION OF PROSTATE N/A 08/22/2014   Procedure: TRANSURETHRAL RESECTION OF THE PROSTATE (TURP);  Surgeon: Marcia Brash, MD;  Location[facility];  Service: Urology;  Laterality: N/A;    Social History   Socioeconomic History  . Marital status: Widowed    Spouse name: Not on file  . Number of children: 5  . Years of education: master's  . Highest education level: Not on file  Occupational History  . Occupation: Therapist, music: RETIRED  Social Needs  . Financial resource strain: Not on file  . Food insecurity:    Worry: Not on file    Inability: Not on file  . Transportation needs:    Medical: Not on file    Non-medical: Not on file  Tobacco Use  . Smoking status: Former Smoker    Last attempt to quit: 06/21/1983    Years since quitting: 35.3  . Smokeless tobacco: Never Used  Substance and Sexual Activity  . Alcohol use: No  . Drug use: No  . Sexual activity: Never  Lifestyle  .  Physical activity:    Days per week: Not on file    Minutes per session: Not on file  . Stress: Not on file  Relationships  . Social connections:    Talks on phone: Not on file    Gets together: Not on file    Attends religious service: Not on file    Active member of club or organization: Not on file    Attends meetings of clubs or organizations: Not on file    Relationship status: Not on file  . Intimate partner violence:    Fear of current or ex partner: Not on file    Emotionally abused: Not on file    Physically abused: Not on file    Forced sexual activity: Not on file   Other Topics Concern  . Not on file  Social History Narrative  . Not on file    Current Outpatient Medications on File Prior to Visit  Medication Sig Dispense Refill  . amLODipine (NORVASC) 10 MG tablet Take 1 tablet (10 mg total) by mouth daily. 30 tablet 0  . aspirin 81 MG tablet Take 81 mg by mouth daily.    . B-D UF III MINI PEN NEEDLES 31G X 5 MM MISC USE FOR INJECTIONS AS DIRECTED 100 each 0  . carvedilol (COREG) 6.25 MG tablet TAKE 1 TABLET BY MOUTH TWICE DAILY WITH A MEAL. (Patient taking differently: Take 6.25 mg by mouth 2 (two) times daily with a meal. ) 180 tablet 0  . cholecalciferol (VITAMIN D3) 25 MCG (1000 UT) tablet Take 1,000 Units by mouth daily.    . cloNIDine (CATAPRES - DOSED IN MG/24 HR) 0.2 mg/24hr patch Place 1 patch (0.2 mg total) onto the skin once a week. (Patient taking differently: Place 0.2 mg onto the skin every Friday. ) 4 patch 12  . diclofenac sodium (VOLTAREN) 1 % GEL Apply 4 g topically 4 (four) times daily.     . divalproex (DEPAKOTE SPRINKLE) 125 MG capsule Take 125 mg by mouth 2 (two) times daily.     . feeding supplement, ENSURE ENLIVE, (ENSURE ENLIVE) LIQD Take 237 mLs by mouth 2 (two) times daily between meals. 237 mL 12  . furosemide (LASIX) 20 MG tablet Take 40 mg by mouth 2 (two) times daily.    Marland Kitchen geriatric multivitamins-minerals (ELDERTONIC/GEVRABON) LIQD Take 15 mLs by mouth daily.    . haloperidol (HALDOL) 2 MG tablet Take 1 tablet (2 mg total) by mouth every 4 (four) hours as needed for agitation. 30 tablet 0  . hydrALAZINE (APRESOLINE) 50 MG tablet Take 1 tablet (50 mg total) by mouth every 8 (eight) hours. 90 tablet 0  . latanoprost (XALATAN) 0.005 % ophthalmic solution Place 1 drop into the right eye at bedtime.     Marland Kitchen liver oil-zinc oxide (DESITIN) 40 % ointment Apply 1 application topically as needed for irritation.    Marland Kitchen loperamide (IMODIUM A-D) 2 MG tablet Take 2 mg by mouth every 6 (six) hours as needed for diarrhea or loose stools.     Marland Kitchen loratadine (CLARITIN) 10 MG tablet Take 10 mg by mouth daily.    . Multiple Vitamin (MULTIVITAMIN) tablet Take 1 tablet by mouth daily.      Marland Kitchen MYRBETRIQ 50 MG TB24 tablet Take 50 mg by mouth daily.     Marland Kitchen OLANZapine (ZYPREXA) 5 MG tablet Take 1 tablet (5 mg total) by mouth at bedtime. 30 tablet 0  . ondansetron (ZOFRAN) 4 MG tablet Take 1 tablet (4  mg total) by mouth daily as needed for nausea or vomiting. 30 tablet 1  . ONE TOUCH ULTRA TEST test strip CHECK BLOOD SUGAR 6 TIMES A DAY. 100 each 6  . Polyethyl Glycol-Propyl Glycol (SYSTANE OP) Place 1 drop into both eyes every 8 (eight) hours as needed (dry eyes).    . potassium chloride SA (K-DUR,KLOR-CON) 20 MEQ tablet Take 1 tablet (20 mEq total) by mouth daily. 30 tablet 0  . promethazine (PHENERGAN) 12.5 MG suppository Place 12.5 mg rectally every 6 (six) hours as needed for nausea or vomiting.    . saccharomyces boulardii (FLORASTOR) 250 MG capsule Take 250 mg by mouth daily.    Marland Kitchen senna-docusate (SENOKOT-S) 8.6-50 MG tablet Take 1 tablet by mouth at bedtime.    Marland Kitchen SIMBRINZA 1-0.2 % SUSP Place 1 drop into both eyes 3 (three) times daily.    . simethicone (MYLICON) 80 MG chewable tablet Chew 80 mg by mouth every 6 (six) hours as needed for flatulence.    . traMADol (ULTRAM) 50 MG tablet Take 50 mg by mouth every 6 (six) hours as needed.     No current facility-administered medications on file prior to visit.     Allergies  Allergen Reactions  . Ace Inhibitors Other (See Comments) and Cough    Chronic cough  . Demerol Other (See Comments)    Hard to wake up  . Cosopt [Dorzolamide Hcl-Timolol Mal] Other (See Comments)    Cause heart rate to drop  . Flomax [Tamsulosin Hcl] Other (See Comments)    Low BP  . Morphine And Related Other (See Comments)    hallucinations  . Oxytrol [Oxybutynin] Rash    Family History  Problem Relation Age of Onset  . Diabetes Mother   . Hypertension Mother   . Prostate cancer Father   . Heart disease  Father   . Diabetes Brother   . Cancer Brother   . Heart disease Brother   . Diabetes Sister   . Heart disease Sister   . Hyperlipidemia Child   . Hypertension Child   . Diabetes Child   . Heart attack Child   . Cancer Child     Review of Systems Denies LOC.  He has gained a few lbs.     Objective:   Physical Exam    Lab Results  Component Value Date   CREATININE 2.20 (H) 07/03/2018   BUN 29 07/03/2018   NA 142 07/03/2018   K 4.0 07/03/2018   CL 103 07/03/2018   CO2 23 07/03/2018       Assessment & Plan:  Insulin-requiring type 2 DM, with hypoglycemia: The pattern of his cbg's indicates he needs some adjustment in his therapy Frail elderly state: he is not a candidate for aggressive of glycemic control.  Renal failure: in this setting, he needs most of his daily insulin in the morning.    Patient Instructions  Change the insulin to 44 units with breakfast, and 10 units with supper. This new change can begin BEFORE drawing his A1C. Please draw an A1C at the next lab draw. Send results to [phone].  Dx code: E11.9.   Check your blood sugar 4 times a day.  vary the time of day when you check, between before the 3 meals, and at bedtime.  also check if you have symptoms of your blood sugar being too high or too low.  please keep a record of the readings and bring it to your next appointment here (or  you can bring the meter itself).  You can write it on any piece of paper.  please call us sooner if your blood sugar goes below 70, or if you have a lot of readings over 200. Please have a a follow-up appointment in 3 months.

## 2019-01-21 ENCOUNTER — Telehealth: Payer: Self-pay | Admitting: Internal Medicine

## 2019-01-21 DIAGNOSIS — E1122 Type 2 diabetes mellitus with diabetic chronic kidney disease: Secondary | ICD-10-CM

## 2019-01-21 DIAGNOSIS — Z794 Long term (current) use of insulin: Secondary | ICD-10-CM

## 2019-01-21 MED ORDER — HUMULIN 70/30 (70-30) 100 UNIT/ML ~~LOC~~ SUSP: SUBCUTANEOUS | 3 refills | Status: DC

## 2019-01-21 NOTE — Telephone Encounter (Signed)
New orders faxed to [facility] with confirmation received.

## 2019-01-21 NOTE — Telephone Encounter (Signed)
We don;t send orders to a pharmacy. Dr. Loanne Drilling sends the orders to [facility] for nursing staff to administer medications. He typically gives me a letter to fax.

## 2019-01-21 NOTE — Telephone Encounter (Signed)
Received the following glucose log     Recommendations   Increase Humulin 70/30 from 44 units with 52 units with Breakfast  Increase Humulin 70/30 from 10 units to 12 units with Supper    Check glucose before Breakfast and before supper     Abby Nena Jordan, MD  [facility]  [facility] [address] [place], [place] [address] Phone: [phone] FAX: [phone]

## 2019-02-02 ENCOUNTER — Inpatient Hospital Stay (HOSPITAL_COMMUNITY)
Admission: EM | Admit: 2019-02-02 | Discharge: 2019-02-12 | DRG: 193 | Disposition: A | Discharge status: Skilled Nursing Facility | Payer: Medicare Other | Source: Skilled Nursing Facility | Attending: Internal Medicine | Admitting: Internal Medicine

## 2019-02-02 ENCOUNTER — Emergency Department (HOSPITAL_COMMUNITY): Payer: Medicare Other

## 2019-02-02 DIAGNOSIS — F0280 Dementia in other diseases classified elsewhere without behavioral disturbance: Secondary | ICD-10-CM | POA: Diagnosis present

## 2019-02-02 DIAGNOSIS — Z96642 Presence of left artificial hip joint: Secondary | ICD-10-CM | POA: Diagnosis present

## 2019-02-02 DIAGNOSIS — E1169 Type 2 diabetes mellitus with other specified complication: Secondary | ICD-10-CM | POA: Diagnosis present

## 2019-02-02 DIAGNOSIS — R300 Dysuria: Secondary | ICD-10-CM | POA: Diagnosis not present

## 2019-02-02 DIAGNOSIS — N184 Chronic kidney disease, stage 4 (severe): Secondary | ICD-10-CM | POA: Diagnosis present

## 2019-02-02 DIAGNOSIS — N179 Acute kidney failure, unspecified: Secondary | ICD-10-CM | POA: Diagnosis present

## 2019-02-02 DIAGNOSIS — Z87891 Personal history of nicotine dependence: Secondary | ICD-10-CM

## 2019-02-02 DIAGNOSIS — R3129 Other microscopic hematuria: Secondary | ICD-10-CM | POA: Diagnosis present

## 2019-02-02 DIAGNOSIS — Z8249 Family history of ischemic heart disease and other diseases of the circulatory system: Secondary | ICD-10-CM | POA: Diagnosis not present

## 2019-02-02 DIAGNOSIS — E1165 Type 2 diabetes mellitus with hyperglycemia: Secondary | ICD-10-CM | POA: Diagnosis present

## 2019-02-02 DIAGNOSIS — I5033 Acute on chronic diastolic (congestive) heart failure: Secondary | ICD-10-CM | POA: Diagnosis present

## 2019-02-02 DIAGNOSIS — Z794 Long term (current) use of insulin: Secondary | ICD-10-CM

## 2019-02-02 DIAGNOSIS — G301 Alzheimer's disease with late onset: Secondary | ICD-10-CM | POA: Diagnosis present

## 2019-02-02 DIAGNOSIS — D631 Anemia in chronic kidney disease: Secondary | ICD-10-CM | POA: Diagnosis present

## 2019-02-02 DIAGNOSIS — F0390 Unspecified dementia without behavioral disturbance: Secondary | ICD-10-CM | POA: Diagnosis not present

## 2019-02-02 DIAGNOSIS — Z960 Presence of urogenital implants: Secondary | ICD-10-CM | POA: Diagnosis not present

## 2019-02-02 DIAGNOSIS — R197 Diarrhea, unspecified: Secondary | ICD-10-CM | POA: Diagnosis not present

## 2019-02-02 DIAGNOSIS — K469 Unspecified abdominal hernia without obstruction or gangrene: Secondary | ICD-10-CM | POA: Diagnosis not present

## 2019-02-02 DIAGNOSIS — R140 Abdominal distension (gaseous): Secondary | ICD-10-CM | POA: Diagnosis not present

## 2019-02-02 DIAGNOSIS — R1312 Dysphagia, oropharyngeal phase: Secondary | ICD-10-CM | POA: Diagnosis present

## 2019-02-02 DIAGNOSIS — E785 Hyperlipidemia, unspecified: Secondary | ICD-10-CM | POA: Diagnosis present

## 2019-02-02 DIAGNOSIS — I130 Hypertensive heart and chronic kidney disease with heart failure and stage 1 through stage 4 chronic kidney disease, or unspecified chronic kidney disease: Secondary | ICD-10-CM | POA: Diagnosis present

## 2019-02-02 DIAGNOSIS — I5023 Acute on chronic systolic (congestive) heart failure: Secondary | ICD-10-CM | POA: Diagnosis not present

## 2019-02-02 DIAGNOSIS — J9601 Acute respiratory failure with hypoxia: Secondary | ICD-10-CM

## 2019-02-02 DIAGNOSIS — I5020 Unspecified systolic (congestive) heart failure: Secondary | ICD-10-CM | POA: Diagnosis not present

## 2019-02-02 DIAGNOSIS — E870 Hyperosmolality and hypernatremia: Secondary | ICD-10-CM | POA: Diagnosis not present

## 2019-02-02 DIAGNOSIS — Z8042 Family history of malignant neoplasm of prostate: Secondary | ICD-10-CM

## 2019-02-02 DIAGNOSIS — N183 Chronic kidney disease, stage 3 (moderate): Secondary | ICD-10-CM | POA: Diagnosis not present

## 2019-02-02 DIAGNOSIS — D649 Anemia, unspecified: Secondary | ICD-10-CM | POA: Diagnosis not present

## 2019-02-02 DIAGNOSIS — R600 Localized edema: Secondary | ICD-10-CM | POA: Diagnosis not present

## 2019-02-02 DIAGNOSIS — Z7982 Long term (current) use of aspirin: Secondary | ICD-10-CM | POA: Diagnosis not present

## 2019-02-02 DIAGNOSIS — E1122 Type 2 diabetes mellitus with diabetic chronic kidney disease: Secondary | ICD-10-CM | POA: Diagnosis present

## 2019-02-02 DIAGNOSIS — I5032 Chronic diastolic (congestive) heart failure: Secondary | ICD-10-CM | POA: Diagnosis not present

## 2019-02-02 DIAGNOSIS — J189 Pneumonia, unspecified organism: Secondary | ICD-10-CM | POA: Diagnosis present

## 2019-02-02 DIAGNOSIS — Z20828 Contact with and (suspected) exposure to other viral communicable diseases: Secondary | ICD-10-CM | POA: Diagnosis present

## 2019-02-02 DIAGNOSIS — Z833 Family history of diabetes mellitus: Secondary | ICD-10-CM | POA: Diagnosis not present

## 2019-02-02 DIAGNOSIS — R809 Proteinuria, unspecified: Secondary | ICD-10-CM | POA: Diagnosis not present

## 2019-02-02 DIAGNOSIS — Z79899 Other long term (current) drug therapy: Secondary | ICD-10-CM | POA: Diagnosis not present

## 2019-02-02 DIAGNOSIS — N189 Chronic kidney disease, unspecified: Secondary | ICD-10-CM | POA: Diagnosis not present

## 2019-02-02 DIAGNOSIS — R0600 Dyspnea, unspecified: Secondary | ICD-10-CM

## 2019-02-02 LAB — URINALYSIS, ROUTINE W REFLEX MICROSCOPIC
Bacteria, UA: NONE SEEN
Bilirubin Urine: NEGATIVE
Glucose, UA: NEGATIVE mg/dL
Ketones, ur: NEGATIVE mg/dL
Leukocytes,Ua: NEGATIVE
Nitrite: NEGATIVE
Protein, ur: 100 mg/dL — AB
Specific Gravity, Urine: 1.02 (ref 1.005–1.030)
pH: 5 (ref 5.0–8.0)

## 2019-02-02 LAB — CBC WITH DIFFERENTIAL/PLATELET
Abs Immature Granulocytes: 0.08 10*3/uL — ABNORMAL HIGH (ref 0.00–0.07)
Basophils Absolute: 0 10*3/uL (ref 0.0–0.1)
Basophils Relative: 0 %
Eosinophils Absolute: 0 10*3/uL (ref 0.0–0.5)
Eosinophils Relative: 0 %
HCT: 34.4 % — ABNORMAL LOW (ref 39.0–52.0)
Hemoglobin: 10.8 g/dL — ABNORMAL LOW (ref 13.0–17.0)
Immature Granulocytes: 1 %
Lymphocytes Relative: 8 %
Lymphs Abs: 0.9 10*3/uL (ref 0.7–4.0)
MCH: 29.2 pg (ref 26.0–34.0)
MCHC: 31.4 g/dL (ref 30.0–36.0)
MCV: 93 fL (ref 80.0–100.0)
Monocytes Absolute: 1 10*3/uL (ref 0.1–1.0)
Monocytes Relative: 9 %
Neutro Abs: 9.2 10*3/uL — ABNORMAL HIGH (ref 1.7–7.7)
Neutrophils Relative %: 82 %
Platelets: 249 10*3/uL (ref 150–400)
RBC: 3.7 MIL/uL — ABNORMAL LOW (ref 4.22–5.81)
RDW: 13.5 % (ref 11.5–15.5)
WBC: 11.2 10*3/uL — ABNORMAL HIGH (ref 4.0–10.5)
nRBC: 0 % (ref 0.0–0.2)

## 2019-02-02 LAB — COMPREHENSIVE METABOLIC PANEL
ALT: 19 U/L (ref 0–44)
AST: 25 U/L (ref 15–41)
Albumin: 2.9 g/dL — ABNORMAL LOW (ref 3.5–5.0)
Alkaline Phosphatase: 55 U/L (ref 38–126)
Anion gap: 12 (ref 5–15)
BUN: 44 mg/dL — ABNORMAL HIGH (ref 8–23)
CO2: 22 mmol/L (ref 22–32)
Calcium: 8.8 mg/dL — ABNORMAL LOW (ref 8.9–10.3)
Chloride: 110 mmol/L (ref 98–111)
Creatinine, Ser: 2.78 mg/dL — ABNORMAL HIGH (ref 0.61–1.24)
GFR calc Af Amer: 22 mL/min — ABNORMAL LOW (ref >60)
GFR calc non Af Amer: 19 mL/min — ABNORMAL LOW (ref >60)
Glucose, Bld: 167 mg/dL — ABNORMAL HIGH (ref 70–99)
Potassium: 4.1 mmol/L (ref 3.5–5.1)
Sodium: 144 mmol/L (ref 135–145)
Total Bilirubin: 1 mg/dL (ref 0.3–1.2)
Total Protein: 6.8 g/dL (ref 6.5–8.1)

## 2019-02-02 LAB — SARS CORONAVIRUS 2 BY RT PCR (HOSPITAL ORDER, PERFORMED IN ~~LOC~~ HOSPITAL LAB): SARS Coronavirus 2: NEGATIVE

## 2019-02-02 LAB — HEMOGLOBIN A1C
Hgb A1c MFr Bld: 8.6 % — ABNORMAL HIGH (ref 4.8–5.6)
Mean Plasma Glucose: 200.12 mg/dL

## 2019-02-02 LAB — LACTIC ACID, PLASMA
Lactic Acid, Venous: 0.9 mmol/L (ref 0.5–1.9)
Lactic Acid, Venous: 1 mmol/L (ref 0.5–1.9)

## 2019-02-02 LAB — GLUCOSE, CAPILLARY: Glucose-Capillary: 218 mg/dL — ABNORMAL HIGH (ref 70–99)

## 2019-02-02 LAB — BRAIN NATRIURETIC PEPTIDE: B Natriuretic Peptide: 753.4 pg/mL — ABNORMAL HIGH (ref 0.0–100.0)

## 2019-02-02 LAB — CBG MONITORING, ED: Glucose-Capillary: 184 mg/dL — ABNORMAL HIGH (ref 70–99)

## 2019-02-02 MED ORDER — CLONIDINE HCL 0.2 MG/24HR TD PTWK: 0.2000 mg | MEDICATED_PATCH | TRANSDERMAL | Status: DC

## 2019-02-02 MED ORDER — SODIUM CHLORIDE 0.9 % IV SOLN
500.0000 mg | Freq: Once | INTRAVENOUS | Status: AC
  Administered 2019-02-02: 10:00:00 500 mg via INTRAVENOUS
  Filled 2019-02-02: qty 500

## 2019-02-02 MED ORDER — ONDANSETRON HCL 4 MG PO TABS: 4.0000 mg | ORAL_TABLET | Freq: Four times a day (QID) | ORAL | Status: DC | PRN

## 2019-02-02 MED ORDER — DEXTROSE 5 % IV SOLN
250.0000 mg | INTRAVENOUS | Status: DC
  Administered 2019-02-03 – 2019-02-04 (×2): 250 mg via INTRAVENOUS
  Filled 2019-02-02 (×4): qty 250

## 2019-02-02 MED ORDER — INSULIN ASPART 100 UNIT/ML ~~LOC~~ SOLN
0.0000 [IU] | SUBCUTANEOUS | Status: DC
  Administered 2019-02-02 – 2019-02-03 (×3): 5 [IU] via SUBCUTANEOUS
  Administered 2019-02-03 (×2): 3 [IU] via SUBCUTANEOUS
  Administered 2019-02-03: 5 [IU] via SUBCUTANEOUS
  Administered 2019-02-03: 10:00:00 3 [IU] via SUBCUTANEOUS
  Administered 2019-02-04 (×2): 5 [IU] via SUBCUTANEOUS
  Administered 2019-02-04: 2 [IU] via SUBCUTANEOUS
  Administered 2019-02-04: 3 [IU] via SUBCUTANEOUS
  Administered 2019-02-04: 18:00:00 5 [IU] via SUBCUTANEOUS
  Administered 2019-02-04: 2 [IU] via SUBCUTANEOUS
  Administered 2019-02-05: 8 [IU] via SUBCUTANEOUS
  Administered 2019-02-05: 21:00:00 5 [IU] via SUBCUTANEOUS
  Administered 2019-02-05: 8 [IU] via SUBCUTANEOUS
  Administered 2019-02-05: 10:00:00 3 [IU] via SUBCUTANEOUS
  Administered 2019-02-05: 5 [IU] via SUBCUTANEOUS
  Administered 2019-02-05 – 2019-02-06 (×2): 3 [IU] via SUBCUTANEOUS
  Administered 2019-02-06 (×4): 5 [IU] via SUBCUTANEOUS
  Administered 2019-02-06: 3 [IU] via SUBCUTANEOUS
  Administered 2019-02-07: 5 [IU] via SUBCUTANEOUS
  Administered 2019-02-07: 3 [IU] via SUBCUTANEOUS
  Administered 2019-02-07: 5 [IU] via SUBCUTANEOUS
  Administered 2019-02-07: 8 [IU] via SUBCUTANEOUS

## 2019-02-02 MED ORDER — ACETAMINOPHEN 650 MG RE SUPP: 650.0000 mg | Freq: Four times a day (QID) | RECTAL | Status: DC | PRN

## 2019-02-02 MED ORDER — ACETAMINOPHEN 500 MG PO TABS: 1000.0000 mg | ORAL_TABLET | Freq: Once | ORAL | Status: DC

## 2019-02-02 MED ORDER — SODIUM CHLORIDE 0.9 % IV SOLN
1.0000 g | Freq: Once | INTRAVENOUS | Status: AC
  Administered 2019-02-02: 1 g via INTRAVENOUS
  Filled 2019-02-02: qty 10

## 2019-02-02 MED ORDER — ENOXAPARIN SODIUM 30 MG/0.3ML ~~LOC~~ SOLN
30.0000 mg | SUBCUTANEOUS | Status: DC
  Administered 2019-02-02 – 2019-02-11 (×10): 30 mg via SUBCUTANEOUS
  Filled 2019-02-02 (×10): qty 0.3

## 2019-02-02 MED ORDER — ONDANSETRON HCL 4 MG/2ML IJ SOLN
4.0000 mg | Freq: Four times a day (QID) | INTRAMUSCULAR | Status: DC | PRN
  Administered 2019-02-03 – 2019-02-08 (×3): 4 mg via INTRAVENOUS
  Filled 2019-02-02 (×3): qty 2

## 2019-02-02 MED ORDER — ACETAMINOPHEN 325 MG PO TABS
650.0000 mg | ORAL_TABLET | Freq: Four times a day (QID) | ORAL | Status: DC | PRN
  Administered 2019-02-08 – 2019-02-09 (×2): 650 mg via ORAL
  Filled 2019-02-02 (×2): qty 2

## 2019-02-02 MED ORDER — VANCOMYCIN HCL 10 G IV SOLR
2000.0000 mg | Freq: Once | INTRAVENOUS | Status: DC
  Filled 2019-02-02: qty 2000

## 2019-02-02 MED ORDER — SODIUM CHLORIDE 0.9 % IV BOLUS
500.0000 mL | Freq: Once | INTRAVENOUS | Status: AC
  Administered 2019-02-02: 500 mL via INTRAVENOUS

## 2019-02-02 MED ORDER — VANCOMYCIN HCL IN DEXTROSE 1-5 GM/200ML-% IV SOLN: 1000.0000 mg | INTRAVENOUS | Status: DC

## 2019-02-02 MED ORDER — ACETAMINOPHEN 325 MG PO TABS: 650.0000 mg | ORAL_TABLET | Freq: Once | ORAL | Status: DC

## 2019-02-02 MED ORDER — FUROSEMIDE 10 MG/ML IJ SOLN
40.0000 mg | Freq: Once | INTRAMUSCULAR | Status: AC
  Administered 2019-02-02: 40 mg via INTRAVENOUS
  Filled 2019-02-02: qty 4

## 2019-02-02 MED ORDER — MIRABEGRON ER 50 MG PO TB24
50.0000 mg | ORAL_TABLET | Freq: Every day | ORAL | Status: DC
  Administered 2019-02-03 – 2019-02-12 (×10): 50 mg via ORAL
  Filled 2019-02-02 (×10): qty 1

## 2019-02-02 MED ORDER — SODIUM CHLORIDE 0.9 % IV SOLN
1.0000 g | INTRAVENOUS | Status: DC
  Administered 2019-02-03 – 2019-02-10 (×8): 1 g via INTRAVENOUS
  Filled 2019-02-02 (×8): qty 1

## 2019-02-02 MED ORDER — INSULIN ASPART PROT & ASPART (70-30 MIX) 100 UNIT/ML ~~LOC~~ SUSP: 25.0000 [IU] | Freq: Two times a day (BID) | SUBCUTANEOUS | Status: DC

## 2019-02-02 MED ORDER — SODIUM CHLORIDE 0.9 % IV BOLUS
500.0000 mL | Freq: Once | INTRAVENOUS | Status: AC
  Administered 2019-02-02: 12:00:00 500 mL via INTRAVENOUS

## 2019-02-02 NOTE — Progress Notes (Signed)
Pharmacy Antibiotic Note  Gregory Hatfield is a 85 y.o. male admitted on 02/02/2019 with wound infection on lower leg. Family states started on Keflex recently. Also concerns for pneumonia. Pharmacy has been consulted for vancomycin dosing.  Febrile, Tmax 102.9. WBC elevated 11.2. Scr 2.78, elevated from baseline ~1.8-2.0.   Plan: Vancomycin 2 gm IV loading dose Vancomycin 1000 mg IV Q 48 hrs. Goal AUC 400-550. Expected AUC: 472.2 SCr used: 2.78 Monitor cxs, renal function, clinical improvement, and abx de-escalation as needed.  Height: 5\' 11"  (180.3 cm) Weight: 237 lb (107.5 kg) IBW/kg (Calculated) : 75.3  Temp (24hrs), Avg:101.6 F (38.7 C), Min:100.2 F (37.9 C), Max:102.9 F (39.4 C)  Recent Labs  Lab 02/02/19 0740  WBC 11.2*  CREATININE 2.78*  LATICACIDVEN 0.9    Estimated Creatinine Clearance: 21.6 mL/min (A) (by C-G formula based on SCr of 2.78 mg/dL (H)).    Allergies  Allergen Reactions  . Ace Inhibitors Other (See Comments) and Cough    Chronic cough  . Demerol Other (See Comments)    Hard to wake up  . Cosopt [Dorzolamide Hcl-Timolol Mal] Other (See Comments)    Cause heart rate to drop  . Flomax [Tamsulosin Hcl] Other (See Comments)    Low BP  . Morphine And Related Other (See Comments)    hallucinations  . Oxytrol [Oxybutynin] Rash    Antimicrobials this admission: 8/15 Azithromycin >> 8/15 Ceftriaxone >> 8/15 Vanc >>  Microbiology results: 8/15 BCx: sent 8/15 UCx: sent  Thank you for allowing pharmacy to be a part of this patient's care.  [place], 02/02/2019 11:22 AM

## 2019-02-02 NOTE — ED Provider Notes (Addendum)
[facility] EMERGENCY DEPARTMENT Provider Note   CSN: [id number] Arrival date & time: 02/02/19  0240    History   Chief Complaint Chief Complaint  Patient presents with   Fever   Shortness of Breath    HPI Gregory Hatfield is a 85 y.o. male.     The history is provided by the patient.  Fever Temp source:  Subjective Severity:  Mild Onset quality:  Gradual Timing:  Constant Progression:  Unchanged Chronicity:  New Relieved by:  Nothing Worsened by:  Nothing Associated symptoms: no chest pain, no chills, no confusion, no cough, no dysuria, no ear pain, no nausea, no rash, no sore throat and no vomiting   Risk factors: no sick contacts     Past Medical History:  Diagnosis Date   Bradycardia 10/06/2016   CHF (congestive heart failure) ([place])    CKD stage 4 due to type 2 diabetes mellitus ([place]) 06/03/2015   Dyslipidemia associated with type 2 diabetes mellitus ([place]) 05/05/2015   Essential hypertension 05/05/2015   IDDM (insulin dependent diabetes mellitus) ([place])    SDAT (senile dementia of Alzheimer's type) 06/16/2013   Uncontrolled insulin-dependent diabetes mellitus with renal manifestation ([place]) 05/05/2015    Patient Active Problem List   Diagnosis Date Noted   Hypokalemia 04/20/2018   Syncope 04/19/2018   Acute lower UTI 02/09/2018   Acute encephalopathy 02/09/2018   UTI (urinary tract infection) 97/35/3299   Metabolic encephalopathy 24/26/8341   Pressure injury of skin 02/09/2018   Peripheral edema    Chronic diastolic CHF (congestive heart failure) ([place]) 01/16/2018   Symptomatic bradycardia 10/06/2016   Diabetes mellitus with complication ([place]) 96/22/2979   Acute on chronic diastolic heart failure ([place]) 10/06/2016   Hyperlipidemia 01/12/2016   Prostate cancer ([place]) 10/12/2015   Diabetic retinopathy ([place]) 10/12/2015   Goals of care, counseling/discussion 10/12/2015   Open-angle glaucoma 07/06/2015   CKD  stage 4 due to type 2 diabetes mellitus ([place]) 06/03/2015   Vitamin D deficiency 06/03/2015   Medication management 06/03/2015   Insulin-requiring or dependent type II diabetes mellitus ([place]) 05/05/2015   Essential hypertension 05/05/2015   SDAT (senile dementia of Alzheimer's type) ([place]) 06/16/2013    Past Surgical History:  Procedure Laterality Date   CARDIAC CATHETERIZATION  04/2004   normal r-sided pressure, mild pulm HTN   CARPAL TUNNEL RELEASE Right    CERVICAL SPINE SURGERY  1990's   LUMBAR SPINE SURGERY  2003   PROSTATE SURGERY  10/2010   transurethral resection    TOTAL HIP ARTHROPLASTY Left 01/17/2014   Procedure: LEFT TOTAL HIP ARTHROPLASTY;  Surgeon: Yvette Rack., MD;  Location: [facility];  Service: Orthopedics;  Laterality: Left;   TRANSTHORACIC ECHOCARDIOGRAM  05/2006   EF 60-70%; mild calcif of MV; mild MV regurg; LA mildly dilated   TRANSURETHRAL RESECTION OF PROSTATE N/A 08/22/2014   Procedure: TRANSURETHRAL RESECTION OF THE PROSTATE (TURP);  Surgeon: Marcia Brash, MD;  Location: [facility];  Service: Urology;  Laterality: N/A;        Home Medications    Prior to Admission medications   Medication Sig Start Date End Date Taking? Authorizing Provider  acetaminophen (TYLENOL) 650 MG CR tablet Take 650 mg by mouth every 8 (eight) hours as needed for pain.   Yes [provider]  amLODipine (NORVASC) 10 MG tablet Take 1 tablet (10 mg total) by mouth daily. 01/21/18  Yes Shelly Coss, MD  aspirin 81 MG tablet Take 81 mg by mouth daily.  Yes [provider]  carvedilol (COREG) 6.25 MG tablet TAKE 1 TABLET BY MOUTH TWICE DAILY WITH A MEAL. Patient taking differently: Take 6.25 mg by mouth 2 (two) times daily with a meal.  01/20/18  Yes [name], MD  cephALEXin (KEFLEX) 500 MG capsule Take 500 mg by mouth 3 (three) times daily.   Yes [provider]  Cholecalciferol (VITAMIN D3) 125 MCG (5000 UT) CAPS Take 5,000 Units by mouth  See admin instructions. Take 1 tablet (5,000 units totally) by mouth on even days   Yes [provider]  Cholecalciferol (VITAMIN D3) 250 MCG (10000 UT) capsule Take 10,000 Units by mouth See admin instructions. Take 1 tablet (10,000 units totally) by mouth on odd days   Yes [provider]  cloNIDine (CATAPRES - DOSED IN MG/24 HR) 0.2 mg/24hr patch Place 1 patch (0.2 mg total) onto the skin once a week. Patient taking differently: Place 0.2 mg onto the skin every Friday.  02/16/18  Yes Georgette Shell, MD  diclofenac sodium (VOLTAREN) 1 % GEL Apply 4 g topically 4 (four) times daily.  04/09/18  Yes [provider]  furosemide (LASIX) 20 MG tablet Take 40 mg by mouth 2 (two) times daily.   Yes [provider]  HUMULIN 70/30 (70-30) 100 UNIT/ML injection Inject 52 Units into the skin daily with breakfast AND 12 Units daily with supper. 44 units with breakfast, and 10 units with supper.. 01/21/19  Yes Shamleffer, Melanie Crazier, MD  hydrALAZINE (APRESOLINE) 50 MG tablet Take 1 tablet (50 mg total) by mouth every 8 (eight) hours. 04/21/18  Yes Lady Deutscher, MD  latanoprost (XALATAN) 0.005 % ophthalmic solution Place 1 drop into the right eye at bedtime.  04/02/18  Yes [provider]  loperamide (IMODIUM A-D) 2 MG tablet Take 2 mg by mouth every 6 (six) hours as needed for diarrhea or loose stools.   Yes [provider]  loratadine (CLARITIN) 10 MG tablet Take 10 mg by mouth daily.   Yes [provider]  Multiple Vitamin (MULTIVITAMIN) tablet Take 1 tablet by mouth daily.     Yes [provider]  MYRBETRIQ 50 MG TB24 tablet Take 50 mg by mouth daily.  12/10/14  Yes [provider]  Polyethyl Glycol-Propyl Glycol (SYSTANE OP) Place 1 drop into both eyes every 8 (eight) hours as needed (dry eyes).   Yes [provider]  potassium chloride SA (K-DUR,KLOR-CON) 20 MEQ tablet Take 1 tablet (20 mEq total) by mouth  daily. 04/22/18  Yes Lady Deutscher, MD  saccharomyces boulardii (FLORASTOR) 250 MG capsule Take 250 mg by mouth daily.   Yes [provider]  SIMBRINZA 1-0.2 % SUSP Place 1 drop into both eyes 3 (three) times daily. 02/28/13  Yes [provider]  simethicone (MYLICON) 80 MG chewable tablet Chew 80 mg by mouth every 6 (six) hours as needed for flatulence.   Yes [provider]  B-D UF III MINI PEN NEEDLES 31G X 5 MM MISC USE FOR INJECTIONS AS DIRECTED 12/27/17   Renato Shin, MD  divalproex (DEPAKOTE SPRINKLE) 125 MG capsule Take 125 mg by mouth 2 (two) times daily.  04/02/18   [provider]  feeding supplement, ENSURE ENLIVE, (ENSURE ENLIVE) LIQD Take 237 mLs by mouth 2 (two) times daily between meals. 02/12/18   Georgette Shell, MD  geriatric multivitamins-minerals (ELDERTONIC/GEVRABON) LIQD Take 15 mLs by mouth daily. 02/13/18   Georgette Shell, MD  haloperidol (HALDOL) 2 MG  tablet Take 1 tablet (2 mg total) by mouth every 4 (four) hours as needed for agitation. 02/12/18   Georgette Shell, MD  liver oil-zinc oxide (DESITIN) 40 % ointment Apply 1 application topically as needed for irritation.    [provider]  OLANZapine (ZYPREXA) 5 MG tablet Take 1 tablet (5 mg total) by mouth at bedtime. 02/12/18   Georgette Shell, MD  ondansetron [facility]) 4 MG tablet Take 1 tablet (4 mg total) by mouth daily as needed for nausea or vomiting. 02/12/18 02/12/19  Georgette Shell, MD  ONE TOUCH ULTRA TEST test strip CHECK BLOOD SUGAR 6 TIMES A DAY. 02/22/18   Renato Shin, MD  senna-docusate (SENOKOT-S) 8.6-50 MG tablet Take 1 tablet by mouth at bedtime. 05/09/15   Eugenie Filler, MD  traMADol (ULTRAM) 50 MG tablet Take 50 mg by mouth every 6 (six) hours as needed.    [provider]    Family History Family History  Problem Relation Age of Onset   Diabetes Mother    Hypertension Mother    Prostate cancer Father    Heart  disease Father    Diabetes Brother    Cancer Brother    Heart disease Brother    Diabetes Sister    Heart disease Sister    Hyperlipidemia Child    Hypertension Child    Diabetes Child    Heart attack Child    Cancer Child     Social History Social History   Tobacco Use   Smoking status: Former Smoker    Quit date: 06/21/1983    Years since quitting: 35.6   Smokeless tobacco: Never Used  Substance Use Topics   Alcohol use: No   Drug use: No     Allergies   Ace inhibitors, Demerol, Cosopt [dorzolamide hcl-timolol mal], Flomax [tamsulosin hcl], Morphine and related, and Oxytrol [oxybutynin]   Review of Systems Review of Systems  Constitutional: Positive for fever. Negative for chills.  HENT: Negative for ear pain and sore throat.   Eyes: Negative for pain and visual disturbance.  Respiratory: Positive for shortness of breath. Negative for cough.   Cardiovascular: Negative for chest pain and palpitations.  Gastrointestinal: Negative for abdominal pain, nausea and vomiting.  Genitourinary: Negative for dysuria and hematuria.  Musculoskeletal: Negative for arthralgias and back pain.  Skin: Negative for color change and rash.  Neurological: Negative for seizures and syncope.  Psychiatric/Behavioral: Negative for confusion.  All other systems reviewed and are negative.    Physical Exam Updated Vital Signs  ED Triage Vitals  Enc Vitals Group     BP 02/02/19 0730 (!) 150/73     Pulse Rate 02/02/19 0730 75     Resp 02/02/19 0730 (!) 34     Temp 02/02/19 0723 (!) 102.9 F (39.4 C)     Temp Source 02/02/19 0723 Oral     SpO2 02/02/19 0730 90 %     Weight --      Height --      Head Circumference --      Peak Flow --      Pain Score 02/02/19 0725 0     Pain Loc --      Pain Edu? --      Excl. in [place]? --     Physical Exam Vitals signs and nursing note reviewed.  Constitutional:      General: He is not in acute distress.    Appearance: He is  well-developed. He is not  ill-appearing.  HENT:     Head: Normocephalic and atraumatic.  Eyes:     Extraocular Movements: Extraocular movements intact.     Conjunctiva/sclera: Conjunctivae normal.     Pupils: Pupils are equal, round, and reactive to light.  Neck:     Musculoskeletal: Normal range of motion and neck supple.  Cardiovascular:     Rate and Rhythm: Normal rate and regular rhythm.     Pulses: Normal pulses.     Heart sounds: Normal heart sounds. No murmur.  Pulmonary:     Effort: Pulmonary effort is normal. No tachypnea or respiratory distress.     Breath sounds: Normal breath sounds.     Comments: Coarse breath sounds Abdominal:     Palpations: Abdomen is soft.     Tenderness: There is no abdominal tenderness.     Comments: Distension   Musculoskeletal: Normal range of motion.     Right lower leg: No edema.     Left lower leg: No edema.  Skin:    General: Skin is warm and dry.     Comments: Left lower leg wound with no purulent drainage, no obvious major erythema  Neurological:     General: No focal deficit present.     Mental Status: He is alert.      ED Treatments / Results  Labs (all labs ordered are listed, but only abnormal results are displayed) Labs Reviewed  COMPREHENSIVE METABOLIC PANEL - Abnormal; Notable for the following components:      Result Value   Glucose, Bld 167 (*)    BUN 44 (*)    Creatinine, Ser 2.78 (*)    Calcium 8.8 (*)    Albumin 2.9 (*)    GFR calc non Af Amer 19 (*)    GFR calc Af Amer 22 (*)    All other components within normal limits  CBC WITH DIFFERENTIAL/PLATELET - Abnormal; Notable for the following components:   WBC 11.2 (*)    RBC 3.70 (*)    Hemoglobin 10.8 (*)    HCT 34.4 (*)    Neutro Abs 9.2 (*)    Abs Immature Granulocytes 0.08 (*)    All other components within normal limits  SARS CORONAVIRUS 2 (HOSPITAL ORDER, [facility])  CULTURE, BLOOD (ROUTINE X 2)  CULTURE, BLOOD (ROUTINE X  2)  URINE CULTURE  LACTIC ACID, PLASMA  LACTIC ACID, PLASMA  URINALYSIS, ROUTINE W REFLEX MICROSCOPIC  BRAIN NATRIURETIC PEPTIDE    EKG EKG Interpretation  Date/Time:  Saturday February 02 2019 07:40:43 EDT Ventricular Rate:  70 PR Interval:    QRS Duration: 130 QT Interval:  399 QTC Calculation: 431 R Axis:   -63 Text Interpretation:  Sinus rhythm Atrial premature complex Nonspecific IVCD with LAD LVH with secondary repolarization abnormality Anterior infarct, old Confirmed by Lennice Sites [phone]) on 02/02/2019 7:57:46 AM   Radiology Ct Abdomen Pelvis Wo Contrast  Result Date: 02/02/2019 CLINICAL DATA:  85 year old male with abdominal distension and fever EXAM: CT ABDOMEN AND PELVIS WITHOUT CONTRAST TECHNIQUE: Multidetector CT imaging of the abdomen and pelvis was performed following the standard protocol without IV contrast. COMPARISON:  Prior CT scan of the abdomen and pelvis 01/17/2018 FINDINGS: Lower chest: Trace right pleural effusion, improved compared to prior. Minimal dependent atelectasis. The remainder of the visualized lungs are clear. The intracardiac blood pool is hypodense relative to the adjacent myocardium consistent with anemia. The visualized cardiac structures are enlarged. Small hiatal hernia. Hepatobiliary: Normal hepatic contour and  morphology. No discrete hepatic lesions. Normal appearance of the gallbladder. No intra or extrahepatic biliary ductal dilatation. Pancreas: Unremarkable. No pancreatic ductal dilatation or surrounding inflammatory changes. Spleen: Normal in size without focal abnormality. Adrenals/Urinary Tract: Indeterminate left adrenal nodule measures approximately 2.7 cm which is essentially unchanged compared to the prior study by my measurement. The right adrenal gland is unremarkable. No evidence of hydronephrosis or nephrolithiasis. Nonspecific perinephric stranding is present bilaterally. The ureters and bladder are unremarkable. Evaluation of the  bladder is slightly limited secondary to streak artifact from the left hip prosthesis. Stomach/Bowel: Moderate gas throughout the colon, particularly anteriorly in the redundant sigmoid and transverse colon. No focal bowel wall thickening or evidence of obstruction. Several loops of small bowel contain fluid and gas and are minimally but not significantly dilated. Again, no bowel wall thickening. Vascular/Lymphatic: Limited evaluation in the absence of intravenous contrast. Atherosclerotic calcifications present throughout the aorta and branch arteries. No suspicious lymphadenopathy. Reproductive: Prostate is unremarkable. Other: Omental fat containing ventral hernia just right of midline and superior to the umbilicus. Focal soft tissue thickening in the umbilicus is similar compared to prior. No new inflammatory changes. Musculoskeletal: No acute fracture or aggressive appearing lytic or blastic osseous lesion. Surgical changes of prior left hip arthroplasty and posterior lumbar interbody fusion from L3-L5 without evidence of hardware complication. Mild right hip joint osteoarthritis. Multilevel degenerative disc disease most notable at L5-S1. IMPRESSION: 1. Mild gaseous distension of the redundant sigmoid and transverse colons as well as a small to moderate amount of gas in the small bowel. This may account for the patient's abdominal distension. 2. No evidence of bowel wall thickening or obstruction. 3. Trace right pleural effusion, improved compared to 01/17/2018. 4. The intracardiac blood pool is hypodense relative to the adjacent myocardium consistent with anemia. 5.  Aortic Atherosclerosis (ICD10-170.0). 6. Stable nonspecific left adrenal nodule. One year stability suggests that this represents a benign adenoma. 7. Stable omental fat containing ventral hernia right of midline and superior to the umbilicus. 8. Surgical changes of prior left hip arthroplasty and L3-L5 posterior lumbar interbody fusion.  Electronically Signed   By: Jacqulynn Cadet M.D.   On: 02/02/2019 10:13   Dg Chest Port 1 View  Result Date: 02/02/2019 CLINICAL DATA:  Shortness of breath. EXAM: PORTABLE CHEST 1 VIEW COMPARISON:  04/19/2018 FINDINGS: Stable cardiomegaly and ectasia of thoracic aorta. Mild asymmetric opacity is noted in the right midlung field, which may be due to pulmonary infiltrate. Left lung appears clear. No evidence of pleural effusion. IMPRESSION: 1. Mild asymmetric opacity in right midlung field, which may be due to pulmonary infiltrate. Recommend continued radiographic follow-up, with two view chest radiograph if patient able. 2. Stable cardiomegaly. Electronically Signed   By: Marlaine Hind M.D.   On: 02/02/2019 08:55    Procedures .Critical Care Performed by: Lennice Sites, DO Authorized by: Lennice Sites, DO   Critical care provider statement:    Critical care time (minutes):  40   Critical care was necessary to treat or prevent imminent or life-threatening deterioration of the following conditions:  Respiratory failure   Critical care was time spent personally by me on the following activities:  Blood draw for specimens, development of treatment plan with patient or surrogate, discussions with primary provider, evaluation of patient's response to treatment, examination of patient, obtaining history from patient or surrogate, ordering and performing treatments and interventions, ordering and review of laboratory studies, ordering and review of radiographic studies, pulse oximetry, re-evaluation of patient's condition  and review of old charts   I assumed direction of critical care for this patient from another provider in my specialty: no     (including critical care time)  Medications Ordered in ED Medications  azithromycin (ZITHROMAX) 500 mg in sodium chloride 0.9 % 250 mL IVPB (500 mg Intravenous New Bag/Given 02/02/19 1024)  sodium chloride 0.9 % bolus 500 mL (500 mLs Intravenous New  Bag/Given 02/02/19 0819)  cefTRIAXone (ROCEPHIN) 1 g in sodium chloride 0.9 % 100 mL IVPB (1 g Intravenous New Bag/Given 02/02/19 1021)     Initial Impression / Assessment and Plan / ED Course  I have reviewed the triage vital signs and the nursing notes.  Pertinent labs & imaging results that were available during my care of the patient were reviewed by me and considered in my medical decision making (see chart for details).     Gregory Hatfield is a 85 year old male with history of diabetes, hypertension, dementia, heart failure who presents the ED with shortness of breath, fever.  Patient pleasantly demented.  Does not state any specific cough, fever, abdominal pain.  He does have some abdominal distention on exam.  But no focal tenderness.  Has a left lower leg wound that appears more chronic in nature than acute however, family states started on kelfex recently, will add Vanc in case wound infection is source. No obvious purulent drainage.  Patient found to be febrile, mildly tachypneic.  Oxygen was around 88% on room air but did fluctuate.  Had improvement with 2 L of oxygen.  Coronavirus test was negative.  X-ray of the chest concerning for pneumonia.  CT of the abdomen showed no acute findings.  Urinalysis is still pending.  Patient with mild leukocytosis but normal lactic acid.  At this time, my suspicion is likely that he might have a developing pneumonia.  Possibly UTI.  However, does appear to have an oxygen requirement.  Was given gentle hydration with 500 cc normal saline bolus.  And in and out was performed and did not put back any urine.  Therefore he was given fluids.  Patient was started empirically on a Zithromax and Rocephin.  Family was updated.  Patient is still full code.  Hemodynamically stable throughout my care and admitted for further infectious care.  Hypoxic respiratory issue likely secondary to infectious process.  This chart was dictated using voice recognition software.   Despite best efforts to proofread,  errors can occur which can change the documentation meaning.    Final Clinical Impressions(s) / ED Diagnoses   Final diagnoses:  Community acquired pneumonia, unspecified laterality  Acute respiratory failure with hypoxia [facility])    ED Discharge Orders    None       Lennice Sites, DO 02/02/19 [place], [place], DO 02/02/19 1056

## 2019-02-02 NOTE — Progress Notes (Signed)
Pt arrived to unit, RN put pt on 6L, sat in high 80s-low 90s. RR 25. Crackling in his bases. Gilford Rile, MD from internal medicine made aware. MD to see pt.

## 2019-02-02 NOTE — H&P (Signed)
Date: 02/02/2019               Patient Name:  Gregory Hatfield MRN: [id number]  DOB: [birth year] Age / Sex: 85 y.o., male   PCP: Patient, No Pcp Per              Medical Service: Internal Medicine Teaching Service              Attending Physician: Dr. Sid Falcon, MD    First Contact: Rudean Hitt, MS 3 Pager: [phone]  Second Contact: Dr. Maudie Mercury Pager: [phone]  Third Contact Dr. Ina Homes Pager: [phone]       After Hours (After 5p/  First Contact Pager: [phone]  weekends / holidays): Second Contact Pager: [phone]   Chief Complaint: Fever, shortness of breath  History of Present Illness:  Gregory Hatfield is a 85 y.o. male with PMH of dementia, DM II, CKD stage 4, CHF, and HTN who presents with fever and shortness of breath. He was found short of breath at his assisted living [facility] today, with fever per EMS to 102.9, satting at 73% on room air, 99% on 2L nasal canula. Accurate history is difficult to obtain due to his dementia, but he says the cough he is presenting with has lasted about a week. Chest x-ray shows mild asymmetric opacity in right midlung field, no evidence of pleural effusion, and stable cardiomegaly. The patient also has a wound infection on his right lower leg and sores on his buttocks which he is preoccupied with. His abdomen is distended and non-tender. Abdominal CT shows mild gaseous distension with no evidence of bowel wall thickening or obstruction. COVID negative. Presumed CAP given temperature, O2 sat, and chest x-ray findings, received azithromycin and ceftriaxone in the ED.     Meds:  Current Meds  Medication Sig  . acetaminophen (TYLENOL) 650 MG CR tablet Take 650 mg by mouth every 8 (eight) hours as needed for pain.  Marland Kitchen amLODipine (NORVASC) 10 MG tablet Take 1 tablet (10 mg total) by mouth daily.  Marland Kitchen aspirin 81 MG tablet Take 81 mg by mouth daily.  . carvedilol (COREG) 6.25 MG tablet TAKE 1 TABLET BY MOUTH TWICE DAILY WITH A  MEAL. (Patient taking differently: Take 6.25 mg by mouth 2 (two) times daily with a meal. )  . cephALEXin (KEFLEX) 500 MG capsule Take 500 mg by mouth 3 (three) times daily.  . Cholecalciferol (VITAMIN D3) 125 MCG (5000 UT) CAPS Take 5,000 Units by mouth See admin instructions. Take 1 tablet (5,000 units totally) by mouth on even days  . Cholecalciferol (VITAMIN D3) 250 MCG (10000 UT) capsule Take 10,000 Units by mouth See admin instructions. Take 1 tablet (10,000 units totally) by mouth on odd days  . cloNIDine (CATAPRES - DOSED IN MG/24 HR) 0.2 mg/24hr patch Place 1 patch (0.2 mg total) onto the skin once a week. (Patient taking differently: Place 0.2 mg onto the skin every Friday. )  . diclofenac sodium (VOLTAREN) 1 % GEL Apply 4 g topically 4 (four) times daily.   . furosemide (LASIX) 20 MG tablet Take 40 mg by mouth 2 (two) times daily.  Marland Kitchen HUMULIN 70/30 (70-30) 100 UNIT/ML injection Inject 52 Units into the skin daily with breakfast AND 12 Units daily with supper. 44 units with breakfast, and 10 units with supper.. (Patient taking differently: Subcutaneous  52 units in the morning at 7 AM; Subcutaneous 12 units in the evening at 17:00)  . hydrALAZINE (APRESOLINE) 50 MG  tablet Take 1 tablet (50 mg total) by mouth every 8 (eight) hours.  Marland Kitchen latanoprost (XALATAN) 0.005 % ophthalmic solution Place 1 drop into the right eye at bedtime.   Marland Kitchen loperamide (IMODIUM A-D) 2 MG tablet Take 2 mg by mouth every 6 (six) hours as needed for diarrhea or loose stools.  Marland Kitchen loratadine (CLARITIN) 10 MG tablet Take 10 mg by mouth daily.  . Multiple Vitamin (MULTIVITAMIN) tablet Take 1 tablet by mouth daily.    Marland Kitchen MYRBETRIQ 50 MG TB24 tablet Take 50 mg by mouth daily.   . ondansetron (ZOFRAN) 4 MG tablet Take 1 tablet (4 mg total) by mouth daily as needed for nausea or vomiting.  Vladimir Faster Glycol-Propyl Glycol (SYSTANE OP) Place 1 drop into both eyes every 8 (eight) hours as needed (dry eyes).  . potassium chloride SA  (K-DUR,KLOR-CON) 20 MEQ tablet Take 1 tablet (20 mEq total) by mouth daily.  Marland Kitchen saccharomyces boulardii (FLORASTOR) 250 MG capsule Take 250 mg by mouth daily.  Marland Kitchen SIMBRINZA 1-0.2 % SUSP Place 1 drop into both eyes 3 (three) times daily.  . simethicone (MYLICON) 80 MG chewable tablet Chew 80 mg by mouth every 6 (six) hours as needed for flatulence.     Allergies: Allergies as of 02/02/2019 - Review Complete 02/02/2019  Allergen Reaction Noted  . Ace inhibitors Other (See Comments) and Cough 06/11/2013  . Demerol Other (See Comments) 10/07/2010  . Cosopt [dorzolamide hcl-timolol mal] Other (See Comments) 10/06/2016  . Flomax [tamsulosin hcl] Other (See Comments) 10/07/2010  . Morphine and related Other (See Comments) 01/06/2011  . Oxytrol [oxybutynin] Rash 03/01/2013   Past Medical History:  Diagnosis Date  . Bradycardia 10/06/2016  . CHF (congestive heart failure) ([place])   . CKD stage 4 due to type 2 diabetes mellitus ([place]) 06/03/2015  . Dyslipidemia associated with type 2 diabetes mellitus ([place]) 05/05/2015  . Essential hypertension 05/05/2015  . IDDM (insulin dependent diabetes mellitus) ([place])   . SDAT (senile dementia of Alzheimer's type) 06/16/2013  . Uncontrolled insulin-dependent diabetes mellitus with renal manifestation ([place]) 05/05/2015    Family History:   Family History  Problem Relation Age of Onset  . Diabetes Mother   . Hypertension Mother   . Prostate cancer Father   . Heart disease Father   . Diabetes Brother   . Cancer Brother   . Heart disease Brother   . Diabetes Sister   . Heart disease Sister   . Hyperlipidemia Child   . Hypertension Child   . Diabetes Child   . Heart attack Child   . Cancer Child     Social History:  - Currently resides at [facility] assisted living  Social History   Tobacco Use  . Smoking status: Former Smoker    Quit date: 06/21/1983    Years since quitting: 35.6  . Smokeless tobacco: Never Used  Substance Use Topics  .  Alcohol use: No  . Drug use: No    Review of Systems: A complete ROS was negative except as per HPI.   Physical Exam: Blood pressure (!) 146/87, pulse 60, temperature 100.2 F (37.9 C), temperature source Oral, resp. rate (!) 30, height 5\' 11"  (1.803 m), weight 107.5 kg, SpO2 95 %.  General: No acute distress. Not toxic-appearing. Well-nourished man lying in bed.  HEENT: NCAT, moist mucous membranes CV: no m/r/g appreciated. Pulses present in all distal extremities. No pitting edema. No JVD appreciated.  Pulm: Wheezes bilaterally. Normal work of breathing with equal bilateral chest rise.  Abd: Distended, non-tender to palpation. Normal bowel sounds.  Skin: Sores on buttocks appear non-purulent, non-sanguinous.  Neuro: Alert. Oriented to self and place. No gross motor defects noted.   EKG: personally reviewed my interpretation is sinus rhythm. Wide QRS.   CXR: personally reviewed my interpretation is diffuse opacities through right lung field, cardiomegaly.   CBC Latest Ref Rng & Units 02/02/2019 04/21/2018 04/20/2018  WBC 4.0 - 10.5 K/uL 11.2(H) 6.0 6.3  Hemoglobin 13.0 - 17.0 g/dL 10.8(L) 10.5(L) 10.8(L)  Hematocrit 39.0 - 52.0 % 34.4(L) 34.9(L) 35.5(L)  Platelets 150 - 400 K/uL 249 208 226   CMP Latest Ref Rng & Units 02/02/2019 07/03/2018 04/21/2018  Glucose 70 - 99 mg/dL 167(H) 245(H) 210(H)  BUN 8 - 23 mg/dL 44(H) 29 21  Creatinine 0.61 - 1.24 mg/dL 2.78(H) 2.20(H) 1.87(H)  Sodium 135 - 145 mmol/L 144 142 138  Potassium 3.5 - 5.1 mmol/L 4.1 4.0 3.3(L)  Chloride 98 - 111 mmol/L 110 103 104  CO2 22 - 32 mmol/L 22 23 28   Calcium 8.9 - 10.3 mg/dL 8.8(L) 9.3 8.7(L)  Total Protein 6.5 - 8.1 g/dL 6.8 - -  Total Bilirubin 0.3 - 1.2 mg/dL 1.0 - -  Alkaline Phos 38 - 126 U/L 55 - -  AST 15 - 41 U/L 25 - -  ALT 0 - 44 U/L 19 - -    Assessment & Plan by Problem: Active Problems:  Fever Shortness of breath Elevated creatinine Elevated BNP CHF Hypertension Abdominal distension  Dementia Anemia  Gregory Hatfield is a 85 y.o. male with a history of dementia, CKD, CHF, IDDM II, and HTN who presents with fever and SOB. Given his symptoms and findings of opacities in his right lung on chest x-ray and WBC of 11.2, this is most likely community-acquired pneumonia. Given his advanced age, elevated BUN (44), and respiratory rate of 30 on presentation, he is in the severe risk category for pneumonia per the CURB-65 score and will be admitted for treatment. An elevated BNP of 753.4 raises concern for acute on chronic CHF, though there is no sign of fluid overload or poor perfusion on exam.   PNA:  - azithromycin and ceftriaxone 5-7 days, monitor response - blood cultures x2 pending - UA pending  CKD:  - Creatinine to 2.78 from baseline ~1.8 - 2.0, BUN to 44 - normal saline prn  Hypertension:  - On home clonidine, 0.2 mg patch  CHF:  - Elevated BNP (753.4) but no sign of volume overload on exam.  - Continue to monitor.   DM:  - SSI  Anemia:  - Hgb 10.8, likely anemia of chronic disease.  - Continue to monitor  Dementia: - Monitor   DVT Prophylaxis: Lovenox 40 mg   Dispo: Admit patient to Inpatient with expected length of stay greater than 2 midnights.  Signed: Kendell Bane, Medical Student 02/02/2019, 11:46 AM  Pager: [phone]

## 2019-02-02 NOTE — ED Triage Notes (Signed)
Pt received 1000 tylenol PTA

## 2019-02-02 NOTE — ED Triage Notes (Signed)
Hx of CHF, HTN, dementia, found today at [facility] short of breath, fever for EMS, Spo2 73% on room air, 98-100% on the non rebreather. Oriented to baseline

## 2019-02-03 ENCOUNTER — Inpatient Hospital Stay (HOSPITAL_COMMUNITY): Payer: Medicare Other

## 2019-02-03 DIAGNOSIS — N179 Acute kidney failure, unspecified: Secondary | ICD-10-CM

## 2019-02-03 DIAGNOSIS — D649 Anemia, unspecified: Secondary | ICD-10-CM

## 2019-02-03 DIAGNOSIS — E1122 Type 2 diabetes mellitus with diabetic chronic kidney disease: Secondary | ICD-10-CM

## 2019-02-03 DIAGNOSIS — F0390 Unspecified dementia without behavioral disturbance: Secondary | ICD-10-CM

## 2019-02-03 DIAGNOSIS — I130 Hypertensive heart and chronic kidney disease with heart failure and stage 1 through stage 4 chronic kidney disease, or unspecified chronic kidney disease: Secondary | ICD-10-CM

## 2019-02-03 DIAGNOSIS — Z960 Presence of urogenital implants: Secondary | ICD-10-CM

## 2019-02-03 DIAGNOSIS — J9601 Acute respiratory failure with hypoxia: Secondary | ICD-10-CM

## 2019-02-03 DIAGNOSIS — N189 Chronic kidney disease, unspecified: Secondary | ICD-10-CM

## 2019-02-03 DIAGNOSIS — R300 Dysuria: Secondary | ICD-10-CM

## 2019-02-03 DIAGNOSIS — J189 Pneumonia, unspecified organism: Principal | ICD-10-CM

## 2019-02-03 DIAGNOSIS — K469 Unspecified abdominal hernia without obstruction or gangrene: Secondary | ICD-10-CM

## 2019-02-03 DIAGNOSIS — Z79899 Other long term (current) drug therapy: Secondary | ICD-10-CM

## 2019-02-03 DIAGNOSIS — I5032 Chronic diastolic (congestive) heart failure: Secondary | ICD-10-CM

## 2019-02-03 LAB — BASIC METABOLIC PANEL
Anion gap: 13 (ref 5–15)
BUN: 54 mg/dL — ABNORMAL HIGH (ref 8–23)
CO2: 23 mmol/L (ref 22–32)
Calcium: 8.4 mg/dL — ABNORMAL LOW (ref 8.9–10.3)
Chloride: 110 mmol/L (ref 98–111)
Creatinine, Ser: 2.53 mg/dL — ABNORMAL HIGH (ref 0.61–1.24)
GFR calc Af Amer: 25 mL/min — ABNORMAL LOW (ref >60)
GFR calc non Af Amer: 21 mL/min — ABNORMAL LOW (ref >60)
Glucose, Bld: 188 mg/dL — ABNORMAL HIGH (ref 70–99)
Potassium: 3.8 mmol/L (ref 3.5–5.1)
Sodium: 146 mmol/L — ABNORMAL HIGH (ref 135–145)

## 2019-02-03 LAB — CBC
HCT: 35.8 % — ABNORMAL LOW (ref 39.0–52.0)
Hemoglobin: 11 g/dL — ABNORMAL LOW (ref 13.0–17.0)
MCH: 28.6 pg (ref 26.0–34.0)
MCHC: 30.7 g/dL (ref 30.0–36.0)
MCV: 93.2 fL (ref 80.0–100.0)
Platelets: 261 10*3/uL (ref 150–400)
RBC: 3.84 MIL/uL — ABNORMAL LOW (ref 4.22–5.81)
RDW: 13.7 % (ref 11.5–15.5)
WBC: 11.3 10*3/uL — ABNORMAL HIGH (ref 4.0–10.5)
nRBC: 0 % (ref 0.0–0.2)

## 2019-02-03 LAB — URINE CULTURE: Culture: NO GROWTH

## 2019-02-03 LAB — GLUCOSE, CAPILLARY
Glucose-Capillary: 130 mg/dL — ABNORMAL HIGH (ref 70–99)
Glucose-Capillary: 151 mg/dL — ABNORMAL HIGH (ref 70–99)
Glucose-Capillary: 167 mg/dL — ABNORMAL HIGH (ref 70–99)
Glucose-Capillary: 195 mg/dL — ABNORMAL HIGH (ref 70–99)
Glucose-Capillary: 205 mg/dL — ABNORMAL HIGH (ref 70–99)
Glucose-Capillary: 221 mg/dL — ABNORMAL HIGH (ref 70–99)
Glucose-Capillary: 225 mg/dL — ABNORMAL HIGH (ref 70–99)

## 2019-02-03 LAB — STREP PNEUMONIAE URINARY ANTIGEN: Strep Pneumo Urinary Antigen: NEGATIVE

## 2019-02-03 MED ORDER — SIMETHICONE 80 MG PO CHEW
80.0000 mg | CHEWABLE_TABLET | Freq: Two times a day (BID) | ORAL | Status: DC
  Administered 2019-02-03 – 2019-02-12 (×17): 80 mg via ORAL
  Filled 2019-02-03 (×18): qty 1

## 2019-02-03 MED ORDER — GERHARDT'S BUTT CREAM
TOPICAL_CREAM | Freq: Four times a day (QID) | CUTANEOUS | Status: DC
  Administered 2019-02-04 – 2019-02-12 (×32): via TOPICAL
  Filled 2019-02-03 (×2): qty 1

## 2019-02-03 NOTE — Progress Notes (Signed)
   Subjective:  Gregory Hatfield is a 85 y.o. male with dementia and hisotry of IMDD II, CKD, HTN, and HFpEF who presented to the ED yesterday with fever shortness of breath, low 02 sats, and diffuse opacities in the right lung field on chest x-ray - presumed CAP.   Today Gregory Hatfield is awake and alert. He says he is short of breath but denies chest pain. He complains of dysuria, which is likely related to his catheter. Otherwise, he says he is feeling fine.    Objective:  Vital signs in last 24 hours: Vitals:   02/03/19 0415 02/03/19 0500 02/03/19 0830 02/03/19 1100  BP: (!) 153/63  (!) 159/65 (!) 160/71  Pulse: 64  70 71  Resp:    (!) 25  Temp:  (!) 100.9 F (38.3 C)    TempSrc:  Oral    SpO2: 91%  93% (!) 88%  Weight: 103 kg     Height:       Weight change:   Intake/Output Summary (Last 24 hours) at 02/03/2019 1113 Last data filed at 02/03/2019 1041 Gross per 24 hour  Intake 369.8 ml  Output 1751 ml  Net -1381.2 ml   Physical Exam:  Gen: well-nourished elderly man. Not in any acute distress, sitting in bed.  HEENT: NAT, MMM, no scleral icterus CV: RRR, no m/r/g noted. No LE edema.  Pulm: Use of accessory muscles when breathing. Expiratory wheezing at left base. Right sided coarse breath sounds. 5L by nasal canula.  Abd: Distended, non-tender.  Neuro: Conversant with normal speech, moves all extremities without difficulty.  Psych: Alert, confused. Oriented to year, needs prompting for place.     Assessment/Plan:  Active Problems:   CAP (community acquired pneumonia)   PNA:  - azithromycin and ceftriaxone 5-7 days, monitor response - blood cultures x2 pending - Check strep urinary antigen - Supplemental O2  AKI on CKD:  - Likely related to PNA - Creatinine to 2.78 from baseline ~1.8 - 2.0, BUN to 44 - normal saline prn - encourage PO intake - UA with microscopic hematuria and protein after straight cath. Repeat in 1-2 days.   HFpEF:  - Elevated BNP (753.4)  but no sign of volume overload on exam.  - Continue to monitor.   DM:  - SSI  Hypertension:  - On home clonidine, 0.2 mg patch  Anemia:  - Hgb 10.8, likely anemia of chronic disease.  - Continue to monitor  Dementia: - Monitor   DVT Prophylaxis: Lovenox 30 mg   LOS: 1 day   Kendell Bane, Medical Student 02/03/2019, 11:13 AM

## 2019-02-03 NOTE — Progress Notes (Signed)
Paged around 1900 by bedside RN regarding declining respiratory status throughout the day.  Was placed on BiPAP due to inability to maintain O2 sats. Dr. Maricela Bo and I assessed patient at bedside. BiPAP set at 80%FiO2 with spO2 being maintained >95%. Patient appeared comfortable. No JVD or peripheral edema present. Lung sounds revealed scattered crackles and wheezes throughout. CXR reading revealed persistent and mildly progressed confluent opacity within the mid right lung, concerning for pneumonia as well as cardiomegaly with mild perihilar vascular congestion without overt pulmonary edema.  Temperatures appear to trending down as patient was afebrile today--Tmax 100.26F. Remainder of VS appear stable. Per chart review, it appears that patient is down 4kg from admission however I'm unsure of the method of initial measurement. Patient is currently being treated with azithromycin and rocephin.   Plan: will continue to monitor. Will give one time dose of 40mg  IV lasix as patient does appear to be hemodynamically stable. Hope dose is 80mg  lasix po.

## 2019-02-03 NOTE — Consult Note (Signed)
[facility] Nurse wound consult note Reason for Consult: With moisture lesions on bilateral buttock that are complicated by chronic friction. Wound type: Moisture plus friction, LEs with lesions of unknown origin (full thickness) Pressure Injury POA: N/A Measurement:  Buttock lesions measure 4cm x 3cm x 0.2cm on either side of gluteal cleft. 1cm ring of maceration surrounds lesions. Wound bed is red, moist.. Left LE: with circumferential lesions, not confluent. Largest area measures 7cm x 5cm with scattered lesions within. Wound beds are red and yellow, dry. No exudate. Wound bed:As described above Drainage (amount, consistency, odor) As described above Periwound: Dry, intact. Dressing procedure/placement/frequency: Patient is at high risk for skin breakdown in the buttock, sacral and coccygeal areas, as well as to bilateral heels.  He is incontinent of feces at the time of my assessment and has an indwelling urinary catheter. I will provide a mattress replacement with low air loss feature, bilateral heel boots. I will provide Nursing with guidance for the care of the LE ulcerations using xeroform gauze and securing with Kerlix after topping with ABD pads. The buttock lesions will be treated 44 times daily using a 1:1:1 compounded preparation of hydrocortisone, lotrimin and zinc oxide (Gerhart's Butt cream).  Side to side positioning while minimizing time in the supine position is recommended.   [facility] nursing team will not follow, but will remain available to this patient, the nursing and medical teams.  Please re-consult if needed. Thanks, Maudie Flakes, MSN, RN, [place], Arther Abbott  Pager# [phone]

## 2019-02-03 NOTE — Progress Notes (Signed)
Pt was placed on BIPAP per MD order. Pt seems to be relaxing after putting him on BIPAP. Pt has rhonchi throughout. RT will continue to monitor.

## 2019-02-03 NOTE — Progress Notes (Signed)
LS Rhonchi and coarse crackles.  O2 sat 90% on 2L.  Dr. Daryll Drown made aware.  Idolina Primer, RN

## 2019-02-03 NOTE — Progress Notes (Signed)
  Date: 02/03/2019  Patient name: Gregory Hatfield  Medical record number: [id number]  Date of birth: [birth year]   I have seen and evaluated Gregory Hatfield and discussed their care with the Residency Team. Briefly, Gregory Hatfield is a 85 year old man with PMH of dementia, DM2, CKD, HFpEF, HTN who presents for SOB.  He was found to have a fever, hypoxia and cough at his SNF.  He cannot really remember his symptoms, reporting feeling well to me this morning.  He was very concerned about the condom catheter which was present.  He feels he is having some dysuria, but mainly due to the catheter.  He reports no fever or pain.    Vitals:   02/03/19 0500 02/03/19 0830  BP:  (!) 159/65  Pulse:  70  Resp:    Temp: (!) 100.9 F (38.3 C)   SpO2:  93%   Gen: Elderly gentleman, mild distress due to SOB Eyes: Mild change in sclerae color, no icterus, no injection HENT: [place] in place, MMM CV: RR, normal rhythm, no murmur noted. No LE edema Pulm: Right sided course breath sounds and rales to mid lung, crackles at the bases, crackles at left base with expiratory wheezing.  [place] in place at 5L Abd: Distended, non tender, minimal bowel sounds, small hernia RLQ, non tender MSK: Muscle tone is slight low Neuro: Grossly intact, moving extremities without issue, speech is clear Psych: Confused, alert, oriented to year and president but wasn't sure where he was without prompting  CXR: 02/02/19 read by Dr. Kris Hartmann  IMPRESSION: 1. Mild asymmetric opacity in right midlung field, which may be due to pulmonary infiltrate. Recommend continued radiographic follow-up, with two view chest radiograph if patient able. 2. Stable cardiomegaly.  CT abdomen 02/02/19 as read by Dr. Laurence Ferrari IMPRESSION: 1. Mild gaseous distension of the redundant sigmoid and transverse colons as well as a small to moderate amount of gas in the small bowel. This may account for the patient's abdominal distension. 2. No evidence of bowel wall  thickening or obstruction. 3. Trace right pleural effusion, improved compared to 01/17/2018. 4. The intracardiac blood pool is hypodense relative to the adjacent myocardium consistent with anemia. 5.  Aortic Atherosclerosis (ICD10-170.0). 6. Stable nonspecific left adrenal nodule. One year stability suggests that this represents a benign adenoma. 7. Stable omental fat containing ventral hernia right of midline and superior to the umbilicus. 8. Surgical changes of prior left hip arthroplasty and L3-L5 posterior lumbar interbody fusion.  Assessment and Plan: I have seen and evaluated the patient as outlined above. I agree with the formulated Assessment and Plan as detailed in the residents' note, with the following changes:   1. CAP - Check strep urinary antigen - Continue oxygen to keep Saturation > 92% - SARS-CoV negative - Blood cultures pending - Trend WBC - Continue Rocephin/Azithromycin  2. AKI on CKD - Likely related to acute infection - UA with microscopic hematuria and protein (after straight cath) - Repeat UA in 1-2 days to evaluate for clearing - Encourage PO intake, if Cr not improving would start IVF  3. Chronic HFpEF - BNP elevated, but I think this is likely in relation to acute illness, not a CHF exacerbation - Monitor for volume overload  4. DM 2 - SSI  - A1C elevated, would review home medications and optimize prior to discharge.     Sid Falcon, MD 8/16/20209:15 AM

## 2019-02-03 NOTE — Evaluation (Signed)
Clinical/Bedside Swallow Evaluation Patient Details  Name: Gregory Hatfield MRN: [id number] Date of Birth: 1925-10-30  Today's Date: 02/03/2019 Time: SLP Start Time (ACUTE ONLY): 1004 SLP Stop Time (ACUTE ONLY): 1016 SLP Time Calculation (min) (ACUTE ONLY): 12 min  Past Medical History:  Past Medical History:  Diagnosis Date  . Bradycardia 10/06/2016  . CHF (congestive heart failure) ([place])   . CKD stage 4 due to type 2 diabetes mellitus ([place]) 06/03/2015  . Dyslipidemia associated with type 2 diabetes mellitus ([place]) 05/05/2015  . Essential hypertension 05/05/2015  . IDDM (insulin dependent diabetes mellitus) ([place])   . SDAT (senile dementia of Alzheimer's type) 06/16/2013  . Uncontrolled insulin-dependent diabetes mellitus with renal manifestation ([place]) 05/05/2015   Past Surgical History:  Past Surgical History:  Procedure Laterality Date  . CARDIAC CATHETERIZATION  04/2004   normal r-sided pressure, mild pulm HTN  . CARPAL TUNNEL RELEASE Right   . CERVICAL SPINE SURGERY  1990's  . LUMBAR SPINE SURGERY  2003  . PROSTATE SURGERY  10/2010   transurethral resection   . TOTAL HIP ARTHROPLASTY Left 01/17/2014   Procedure: LEFT TOTAL HIP ARTHROPLASTY;  Surgeon: Yvette Rack., MD;  Location: [facility];  Service: Orthopedics;  Laterality: Left;  . TRANSTHORACIC ECHOCARDIOGRAM  05/2006   EF 60-70%; mild calcif of MV; mild MV regurg; LA mildly dilated  . TRANSURETHRAL RESECTION OF PROSTATE N/A 08/22/2014   Procedure: TRANSURETHRAL RESECTION OF THE PROSTATE (TURP);  Surgeon: Marcia Brash, MD;  Location: [facility];  Service: Urology;  Laterality: N/A;   HPI:  Gregory Hatfield is a 85 y.o. male with PMH of dementia, DM II, CKD stage 4, CHF, and HTN who presents with fever and shortness of breath.     Assessment / Plan / Recommendation Clinical Impression   Pt presents with delayed cough and increase in respiratory rate (from high 20s/low 30s to 40s/50s) throughout PO intake.  Suspect that these  symptoms are secondary to acutely decreased respiratory status rather than primarily related to a dysphagia as his swallow appears to be mechanically functional at bedside.  Oral phase was timely and swallow response was swift.  Pt denies any difficulty swallowing at baseline other than needing to take his pills in applesauce.  As a result, recommend that pt remain on his currently prescribed diet.  Pt would benefit from brief ST follow up while inpatient to monitor toleration of his currently prescribed diet in light of acute deconditioning.    SLP Visit Diagnosis: Dysphagia, unspecified (R13.10)    Aspiration Risk  Moderate aspiration risk    Diet Recommendation     Medication Administration: Whole meds with puree    Other  Recommendations Oral Care Recommendations: Oral care BID   Follow up Recommendations Skilled Nursing facility      Frequency and Duration min 1 x/week          Prognosis Prognosis for Safe Diet Advancement: Good      Swallow Study   General HPI: Gregory Hatfield is a 85 y.o. male with PMH of dementia, DM II, CKD stage 4, CHF, and HTN who presents with fever and shortness of breath.   Type of Study: Bedside Swallow Evaluation Previous Swallow Assessment: none on record Diet Prior to this Study: Regular;Thin liquids Temperature Spikes Noted: Yes Respiratory Status: Nasal cannula History of Recent Intubation: No Behavior/Cognition: Alert;Cooperative;Confused Oral Cavity Assessment: Within Functional Limits Oral Care Completed by SLP: No Oral Cavity - Dentition: Adequate natural dentition Vision: Functional for self-feeding Self-Feeding Abilities:  Needs assist Patient Positioning: Upright in bed Baseline Vocal Quality: Other (comment)(congested) Volitional Cough: Congested    Oral/Motor/Sensory Function Overall Oral Motor/Sensory Function: Within functional limits   Ice Chips     Thin Liquid Thin Liquid: Impaired Pharyngeal  Phase Impairments: Cough -  Delayed;Change in Vital Signs    Nectar Thick     Honey Thick     Puree     Solid     Solid: Impaired Pharyngeal Phase Impairments: Cough - Delayed;Change in Vital Signs      Gregory Hatfield, Elmyra Ricks L 02/03/2019,10:22 AM

## 2019-02-03 NOTE — Significant Event (Signed)
Rapid Response Event Note  Overview: Respiratory Distress   Initial Focused Assessment: I received call from the [facility] about patient having labored breathing and shortness of breath this evening. I was with another patient in an emergency and I asked that the nurses page the primary provider and that I would as soon I as could. When I arrived, nurses informed that BIPAP was ordered and the providers were coming to see the patient. I assessed the patient 1935 and patient was breathing comfortably on the BIPAP 12/6 80%. He stated his breathing felt better, oxygen saturation was 98% on BIPAP and RR was 20-24. Lung sounds quite diminished, patient's abdomen was quite distended as well   Interventions: -- BIPAP -- CXR   Plan of Care:  -- Follow Up with CXR  -- Wean off BIPAP as patient tolerates.  Call RRT if needed.  Event Summary:  Call Time [place] ( Multiple calls - Emergencies) End Time [place], [place]

## 2019-02-03 NOTE — Progress Notes (Signed)
Pt is c/o SOB with Rhonchi in BL.  O2 sat 87-90%.  Paged MD.  Received order for BiPap.  Idolina Primer, RN

## 2019-02-04 ENCOUNTER — Inpatient Hospital Stay (HOSPITAL_COMMUNITY): Payer: Medicare Other

## 2019-02-04 DIAGNOSIS — I5023 Acute on chronic systolic (congestive) heart failure: Secondary | ICD-10-CM

## 2019-02-04 DIAGNOSIS — R809 Proteinuria, unspecified: Secondary | ICD-10-CM

## 2019-02-04 DIAGNOSIS — R140 Abdominal distension (gaseous): Secondary | ICD-10-CM

## 2019-02-04 DIAGNOSIS — R3129 Other microscopic hematuria: Secondary | ICD-10-CM

## 2019-02-04 DIAGNOSIS — E870 Hyperosmolality and hypernatremia: Secondary | ICD-10-CM

## 2019-02-04 DIAGNOSIS — N183 Chronic kidney disease, stage 3 (moderate): Secondary | ICD-10-CM

## 2019-02-04 DIAGNOSIS — J9601 Acute respiratory failure with hypoxia: Secondary | ICD-10-CM

## 2019-02-04 LAB — URINALYSIS, ROUTINE W REFLEX MICROSCOPIC
Bilirubin Urine: NEGATIVE
Glucose, UA: NEGATIVE mg/dL
Ketones, ur: NEGATIVE mg/dL
Leukocytes,Ua: NEGATIVE
Nitrite: NEGATIVE
Protein, ur: 100 mg/dL — AB
Specific Gravity, Urine: 1.012 (ref 1.005–1.030)
pH: 5 (ref 5.0–8.0)

## 2019-02-04 LAB — GLUCOSE, CAPILLARY
Glucose-Capillary: 139 mg/dL — ABNORMAL HIGH (ref 70–99)
Glucose-Capillary: 165 mg/dL — ABNORMAL HIGH (ref 70–99)
Glucose-Capillary: 228 mg/dL — ABNORMAL HIGH (ref 70–99)
Glucose-Capillary: 232 mg/dL — ABNORMAL HIGH (ref 70–99)
Glucose-Capillary: 212 mg/dL — ABNORMAL HIGH (ref 70–99)
Glucose-Capillary: 210 mg/dL — ABNORMAL HIGH (ref 70–99)

## 2019-02-04 LAB — BASIC METABOLIC PANEL
Anion gap: 14 (ref 5–15)
BUN: 62 mg/dL — ABNORMAL HIGH (ref 8–23)
CO2: 22 mmol/L (ref 22–32)
Calcium: 8.7 mg/dL — ABNORMAL LOW (ref 8.9–10.3)
Chloride: 112 mmol/L — ABNORMAL HIGH (ref 98–111)
Creatinine, Ser: 2.56 mg/dL — ABNORMAL HIGH (ref 0.61–1.24)
GFR calc Af Amer: 24 mL/min — ABNORMAL LOW (ref >60)
GFR calc non Af Amer: 21 mL/min — ABNORMAL LOW (ref >60)
Glucose, Bld: 167 mg/dL — ABNORMAL HIGH (ref 70–99)
Potassium: 3.9 mmol/L (ref 3.5–5.1)
Sodium: 148 mmol/L — ABNORMAL HIGH (ref 135–145)

## 2019-02-04 LAB — CBC
HCT: 36.5 % — ABNORMAL LOW (ref 39.0–52.0)
Hemoglobin: 11.1 g/dL — ABNORMAL LOW (ref 13.0–17.0)
MCH: 28.9 pg (ref 26.0–34.0)
MCHC: 30.4 g/dL (ref 30.0–36.0)
MCV: 95.1 fL (ref 80.0–100.0)
Platelets: 279 10*3/uL (ref 150–400)
RBC: 3.84 MIL/uL — ABNORMAL LOW (ref 4.22–5.81)
RDW: 13.9 % (ref 11.5–15.5)
WBC: 10.9 10*3/uL — ABNORMAL HIGH (ref 4.0–10.5)
nRBC: 0 % (ref 0.0–0.2)

## 2019-02-04 MED ORDER — FUROSEMIDE 10 MG/ML IJ SOLN
40.0000 mg | Freq: Once | INTRAMUSCULAR | Status: AC
  Administered 2019-02-04: 40 mg via INTRAVENOUS
  Filled 2019-02-04: qty 4

## 2019-02-04 MED ORDER — LABETALOL HCL 5 MG/ML IV SOLN
5.0000 mg | INTRAVENOUS | Status: DC | PRN
  Administered 2019-02-06: 5 mg via INTRAVENOUS
  Filled 2019-02-04: qty 4

## 2019-02-04 NOTE — Progress Notes (Signed)
RN called RT stating she had placed pt back on BiPAP. RT confirmed, rounded on pt. Pt tolerating well.

## 2019-02-04 NOTE — Progress Notes (Signed)
Pt is on BIPAP tolerating well

## 2019-02-04 NOTE — Progress Notes (Addendum)
   02/04/19 1408  Vitals  BP (!) 169/74  MAP (mmHg) 102  Pulse Rate 69  ECG Heart Rate 69  Resp (!) 21  Oxygen Therapy  SpO2 95 %  O2 Device Bi-PAP  FiO2 (%) 70 %  MEWS Score  MEWS RR 1  MEWS Pulse 0  MEWS Systolic 0  MEWS LOC 0  MEWS Temp 0  MEWS Score 1  MEWS Score Color Green   Patient taking a nap at this time; placed on Bipap for resp rate, oxygen level and rest. Also will notify MD about BP elevation.  I will continue to monitor the patient closely.   Saddie Benders RN BSN

## 2019-02-04 NOTE — Progress Notes (Signed)
MD on call paged about pt having +1 LA edema. Pt is not on any fluids so it's unclear of why he would be swollen on his LA. Pt also slight fever of 99.1. will continue to monitor pt & await any new orders as needed. Hoover Brunette, RN

## 2019-02-04 NOTE — Progress Notes (Signed)
After night time meds given pt was placed back on bipap for bed. Will continue to monitor the pt. Hoover Brunette, RN

## 2019-02-04 NOTE — Progress Notes (Signed)
   Subjective:  Mr. Go says he is feeling fine today, though his dementia makes it difficult to obtain an accurate picture of his symptoms. He is on BiPAP after desatting to 87% yesterday and has a markedly distended abdomen. He says he is breathing well and denies chest pain and abdominal discomfort.    Objective:  Vital signs in last 24 hours: Vitals:   02/04/19 0645 02/04/19 0725 02/04/19 0803 02/04/19 0911  BP:      Pulse:  62  70  Resp:  (!) 30  (!) 22  Temp:   98.5 F (36.9 C)   TempSrc:   Oral   SpO2:  96%  90%  Weight: 109.3 kg     Height:       Weight change: 1.814 kg  Intake/Output Summary (Last 24 hours) at 02/04/2019 0951 Last data filed at 02/03/2019 1840 Gross per 24 hour  Intake 225 ml  Output 600 ml  Net -375 ml   Physical Exam:  Gen: well-nourished elderly man. Not in any acute distress, laying in bed.  HEENT: NAT, MMM, no scleral icterus CV: RRR, no m/r/g noted. No JVD or LE edema.  Pulm: BiPAP at 80%FiO2.  Abd: Distended, non-tender.  Neuro: Conversant with normal speech, moves all extremities without difficulty.  Psych: Alert, oriented to baseline.     Assessment/Plan:  Active Problems:   CAP (community acquired pneumonia)   Acute respiratory failure with hypoxia (HCC)   PNA:  - Fever resolved. T: 98.5 - WBCs 10.9 from 11.3 - Continue azithromycin and ceftriaxone - blood cultures x2 no growth at 24 hours - Urinary strep antigen negative   Acute respiratory failure with hypoxia:  - BiPAP at 80%FiO2, spO2 90% this AM  Abdominal distension:  - Likely ileus. Order abdominal x-ray   AKI on CKD:  - Likely related to PNA - Creatinine to 2.53 from baseline ~1.8 - 2.0, BUN to 54 from 44 yesterday - normal saline prn - encourage PO intake - UA with microscopic hematuria and protein after straight cath. Repeat UA  HFpEF:  - Elevated BNP (753.4) but no sign of volume overload on exam.  - Continue to monitor.   DM:  - SSI   Hypertension:  - On home clonidine, 0.2 mg patch  Anemia:  - Hgb 11, likely anemia of chronic disease.  - Continue to monitor  Dementia: - Monitor   DVT Prophylaxis: Lovenox 30 mg   LOS: 2 days   Kendell Bane, Medical Student 02/04/2019, 9:51 AM

## 2019-02-04 NOTE — Progress Notes (Signed)
   02/04/19 1933  Vitals  Pulse Rate 65  ECG Heart Rate 66  Resp (!) 28  Oxygen Therapy  SpO2 90 %  O2 Device Nasal Cannula  O2 Flow Rate (L/min) 6 L/min  MEWS Score  MEWS RR 2  MEWS Pulse 0  MEWS Systolic 0  MEWS LOC 0  MEWS Temp 0  MEWS Score 2  MEWS Score Color Yellow   Patient awake, ready to eat dinner and switched to [place] 6L of oxygen. Report given to night shift RN.   Saddie Benders RN BSN

## 2019-02-05 ENCOUNTER — Ambulatory Visit: Payer: Medicare Other | Admitting: Endocrinology

## 2019-02-05 DIAGNOSIS — I5020 Unspecified systolic (congestive) heart failure: Secondary | ICD-10-CM

## 2019-02-05 DIAGNOSIS — R600 Localized edema: Secondary | ICD-10-CM

## 2019-02-05 LAB — CBC
HCT: 33.8 % — ABNORMAL LOW (ref 39.0–52.0)
Hemoglobin: 10.7 g/dL — ABNORMAL LOW (ref 13.0–17.0)
MCH: 29.2 pg (ref 26.0–34.0)
MCHC: 31.7 g/dL (ref 30.0–36.0)
MCV: 92.1 fL (ref 80.0–100.0)
Platelets: 246 10*3/uL (ref 150–400)
RBC: 3.67 MIL/uL — ABNORMAL LOW (ref 4.22–5.81)
RDW: 13.8 % (ref 11.5–15.5)
WBC: 8.9 10*3/uL (ref 4.0–10.5)
nRBC: 0.2 % (ref 0.0–0.2)

## 2019-02-05 LAB — BASIC METABOLIC PANEL
Anion gap: 11 (ref 5–15)
BUN: 68 mg/dL — ABNORMAL HIGH (ref 8–23)
CO2: 21 mmol/L — ABNORMAL LOW (ref 22–32)
Calcium: 8.5 mg/dL — ABNORMAL LOW (ref 8.9–10.3)
Chloride: 115 mmol/L — ABNORMAL HIGH (ref 98–111)
Creatinine, Ser: 2.43 mg/dL — ABNORMAL HIGH (ref 0.61–1.24)
GFR calc Af Amer: 26 mL/min — ABNORMAL LOW (ref >60)
GFR calc non Af Amer: 22 mL/min — ABNORMAL LOW (ref >60)
Glucose, Bld: 199 mg/dL — ABNORMAL HIGH (ref 70–99)
Potassium: 4 mmol/L (ref 3.5–5.1)
Sodium: 147 mmol/L — ABNORMAL HIGH (ref 135–145)

## 2019-02-05 LAB — GLUCOSE, CAPILLARY
Glucose-Capillary: 175 mg/dL — ABNORMAL HIGH (ref 70–99)
Glucose-Capillary: 168 mg/dL — ABNORMAL HIGH (ref 70–99)
Glucose-Capillary: 300 mg/dL — ABNORMAL HIGH (ref 70–99)
Glucose-Capillary: 270 mg/dL — ABNORMAL HIGH (ref 70–99)
Glucose-Capillary: 207 mg/dL — ABNORMAL HIGH (ref 70–99)

## 2019-02-05 LAB — MAGNESIUM: Magnesium: 2.5 mg/dL — ABNORMAL HIGH (ref 1.7–2.4)

## 2019-02-05 MED ORDER — FUROSEMIDE 10 MG/ML IJ SOLN
40.0000 mg | Freq: Once | INTRAMUSCULAR | Status: AC
  Administered 2019-02-05: 40 mg via INTRAVENOUS
  Filled 2019-02-05: qty 4

## 2019-02-05 MED ORDER — FUROSEMIDE 10 MG/ML IJ SOLN
40.0000 mg | Freq: Every day | INTRAMUSCULAR | Status: DC
  Administered 2019-02-06: 40 mg via INTRAVENOUS
  Filled 2019-02-05: qty 4

## 2019-02-05 MED ORDER — AZITHROMYCIN 250 MG PO TABS
250.0000 mg | ORAL_TABLET | Freq: Every day | ORAL | Status: DC
  Administered 2019-02-05 – 2019-02-10 (×6): 250 mg via ORAL
  Filled 2019-02-05 (×6): qty 1

## 2019-02-05 NOTE — Progress Notes (Signed)
  Speech Language Pathology Treatment: Dysphagia  Patient Details Name: Gregory Hatfield MRN: [id number] DOB: 1926-05-05 Today's Date: 02/05/2019 Time: 6811-5726 SLP Time Calculation (min) (ACUTE ONLY): 20 min  Assessment / Plan / Recommendation Clinical Impression  Patient seen to address dysphagia goals but he declined any solids at this time saying "I dont eat a lot of solid foods" because he feels that the liquids are digested and absorbed into the bloodstream faster. He drank two cartons (4 ounces) of milk and 6 ounces of water via straw sips, taking large, successive sips with only overt s/s of aspiration or penetration being a mild intensity delayed cough and throat clear. As initial evaluation did not indicate a significantly impaired swallow function, and patient is tolerating PO's without reports of difficulty and with only a mild amount and intensity of throat clearing and coughing after drinking liquids at rapid rate, SLP will discharge patient from acute care services due to goals met.    HPI HPI: Gregory Hatfield is a 85 y.o. male with PMH of dementia, DM II, CKD stage 4, CHF, and HTN who presents with fever and shortness of breath.        SLP Plan  Discharge SLP treatment due to (comment)(goals met)       Recommendations  Diet recommendations: Regular;Thin liquid Liquids provided via: Straw Medication Administration: Whole meds with puree Supervision: Patient able to self feed Compensations: Minimize environmental distractions Postural Changes and/or Swallow Maneuvers: Seated upright 90 degrees                Oral Care Recommendations: Oral care BID Follow up Recommendations: Skilled Nursing facility SLP Visit Diagnosis: Dysphagia, unspecified (R13.10) Plan: Discharge SLP treatment due to (comment)(goals met)       GO                Dannial Monarch 02/05/2019, 5:11 PM   Sonia Baller, MA, CCC-SLP Speech Therapy [facility] Acute Rehab Pager:  [phone]

## 2019-02-05 NOTE — Care Management Important Message (Signed)
Important Message  Patient Details  Name: Gregory Hatfield MRN: [id number] Date of Birth: 09/03/1925   Medicare Important Message Given:  Yes     [name] 02/05/2019, 12:45 PM

## 2019-02-05 NOTE — Progress Notes (Signed)
Pt placed on BIPAP for night time rest.  Tolerating well at this time. 

## 2019-02-05 NOTE — Progress Notes (Signed)
Paged by nurse stating that she noticed that the left hand is swollen in comparison to right.   Went to bedside to evaluate patient. The patient was resting comfortably. He stated that he did not have any pain in his hand. The left hand was not erythematous or tender to examination, but clearly left hand was more swollen till the left wrist in comparison to right hand. Patient was able to move his fingers and his hand without difficulty. Radial pulsation was symmetric bilaterally.   Advised nurse to ice and elevate left hand as it is likely swollen due to line infilatration. I also requested iv team to come to bedside to evaluate. If swelling worsens I requested RN to let me know so can comeback and re-examine patient.   Gregory Mage, MD Internal Medicine PGY3 IFOYD:[phone] 02/05/2019, 12:24 AM

## 2019-02-05 NOTE — Progress Notes (Addendum)
BiPAP removed. 7LNC placed on patient. Pt resting in bed comfortably.

## 2019-02-05 NOTE — Progress Notes (Signed)
Subjective:  Mr. Gail says he is feeling well today. He appears to be feeling some tenderness in his left hand where he has swelling likely due to IV line infiltration. He says he is breathing well and denies chest pain and abdominal discomfort. He has a nonproductive cough. He is satting in the low 90s on 7L nasal canula from BiPAP the previous day.    Objective:  Vital signs in last 24 hours: Vitals:   02/05/19 0729 02/05/19 0838 02/05/19 1014 02/05/19 1305  BP:  (!) 173/68 (!) 168/69 (!) 175/79  Pulse: (!) 57 63 66 78  Resp: (!) 21 (!) 24 (!) 25 19  Temp:  99.3 F (37.4 C)  98.3 F (36.8 C)  TempSrc:  Oral  Oral  SpO2: 96% 99% 90% 99%  Weight:      Height:       Weight change:   Intake/Output Summary (Last 24 hours) at 02/05/2019 1306 Last data filed at 02/04/2019 2332 Gross per 24 hour  Intake 1045 ml  Output -  Net 1045 ml   Physical Exam:  Gen: well-nourished elderly man. Not in any acute distress, laying comfortably in bed.  HEENT: NAT, MMM, no scleral icterus CV: RRR, no m/r/g noted. Slightly elevated JVD, +1 pitting edema in LE bilaterally.  Pulm: nasal canula 7L.  Abd: Distended, non-tender.  Neuro: Conversant with normal speech, moves all extremities without difficulty.  Psych: Alert, oriented to baseline.   EKG (8/17) shows sinus rhythm with premature atrial complexes  Abdominal X-ray (8/17) shows nonobstructive bowel gas pattern  Bedside US of chest (8/18) shows >3 B lines in lung fields bilaterally, small effusion at right lung base, enlarged left ventricle   Assessment/Plan:  Principal Problem:   Acute respiratory failure with hypoxia (HCC) Active Problems:   CAP (community acquired pneumonia)  Mr. Covalt has an increased oxygen requirement and signs of fluid overload including LE edema, elevated JVD, and >3 B lines and evidence of small effusion on [place]. His acute respiratory failure is likely due to both his pneumonia and volume status  resulting from HFrEF and decreased kidney function. He will continue on his course of antibiotics as he appears to be clinically responding with resolved fever and WBCs now within normal limits. We'll add Lasix for diuresis.   PNA:  - Fever resolved. T: 98.5 - WBCs 8.9 from 10.9 - Continue azithromycin and ceftriaxone - blood cultures x2 no growth at 3 days - Urinary strep antigen negative   HFrEF:  - +1 pitting edema in LE bilaterally, slightly elevated JVD - POCUS shows >3 B lines in lung fields bilaterally, small effusion at the right base.  - Add Lasix 40 mg  Acute respiratory failure with hypoxia:  - 7L by nasal canula from BiPAP yesterday, O2 90-92% - Wean oxygen as tolerated  Abdominal distension:  - Abdominal x-ray shows nonobstructive bowel gas pattern - Continue simethicone 80 mg bid  Edema of left hand: - likely IV line infiltration - continue to monitor for worsening of swelling  AKI on CKD III:  - Likely related to PNA - Creatinine to 2.43 from baseline ~1.8 - 2.0, BUN to 68 from 54 yesterday - Repeat UA shows small Hgb on dipstick, protein 100, uric acid crystals present  DM:  - SSI  Hypertension:  - On home clonidine, 0.2 mg patch  Anemia:  - Hgb 10.7, likely anemia of chronic disease.  - Continue to monitor  Dementia: - Monitor   DVT Prophylaxis:  Lovenox 30 mg   LOS: 3 days   Kendell Bane, Medical Student 02/05/2019, 1:06 PM

## 2019-02-05 NOTE — Progress Notes (Signed)
Spoke with Dola Factor, patient's daughter. Daughter's questions were answered and update given on patient. Daughter stated she had no further questions at this time.

## 2019-02-06 LAB — BASIC METABOLIC PANEL
Anion gap: 14 (ref 5–15)
BUN: 69 mg/dL — ABNORMAL HIGH (ref 8–23)
CO2: 21 mmol/L — ABNORMAL LOW (ref 22–32)
Calcium: 9 mg/dL (ref 8.9–10.3)
Chloride: 112 mmol/L — ABNORMAL HIGH (ref 98–111)
Creatinine, Ser: 2.23 mg/dL — ABNORMAL HIGH (ref 0.61–1.24)
GFR calc Af Amer: 29 mL/min — ABNORMAL LOW (ref >60)
GFR calc non Af Amer: 25 mL/min — ABNORMAL LOW (ref >60)
Glucose, Bld: 196 mg/dL — ABNORMAL HIGH (ref 70–99)
Potassium: 3.8 mmol/L (ref 3.5–5.1)
Sodium: 147 mmol/L — ABNORMAL HIGH (ref 135–145)

## 2019-02-06 LAB — GLUCOSE, CAPILLARY
Glucose-Capillary: 174 mg/dL — ABNORMAL HIGH (ref 70–99)
Glucose-Capillary: 189 mg/dL — ABNORMAL HIGH (ref 70–99)
Glucose-Capillary: 189 mg/dL — ABNORMAL HIGH (ref 70–99)
Glucose-Capillary: 201 mg/dL — ABNORMAL HIGH (ref 70–99)
Glucose-Capillary: 227 mg/dL — ABNORMAL HIGH (ref 70–99)
Glucose-Capillary: 236 mg/dL — ABNORMAL HIGH (ref 70–99)
Glucose-Capillary: 242 mg/dL — ABNORMAL HIGH (ref 70–99)

## 2019-02-06 MED ORDER — FUROSEMIDE 10 MG/ML IJ SOLN: 40.0000 mg | Freq: Two times a day (BID) | INTRAMUSCULAR | Status: DC

## 2019-02-06 MED ORDER — FUROSEMIDE 10 MG/ML IJ SOLN
40.0000 mg | Freq: Two times a day (BID) | INTRAMUSCULAR | Status: DC
  Administered 2019-02-06 – 2019-02-07 (×2): 40 mg via INTRAVENOUS
  Filled 2019-02-06 (×2): qty 4

## 2019-02-06 MED ORDER — GUAIFENESIN ER 600 MG PO TB12
600.0000 mg | ORAL_TABLET | Freq: Two times a day (BID) | ORAL | Status: DC
  Administered 2019-02-06 – 2019-02-12 (×12): 600 mg via ORAL
  Filled 2019-02-06 (×13): qty 1

## 2019-02-06 MED ORDER — FUROSEMIDE 10 MG/ML IJ SOLN
INTRAMUSCULAR | Status: AC
  Administered 2019-02-06: 12:00:00 40 mg
  Filled 2019-02-06: qty 4

## 2019-02-06 NOTE — Progress Notes (Signed)
Pt O2 Sats dropping into 60's. BIPAP placed on pt. O2 sats now at 92% and Pt resting in bed. Teaching services paged and aware. BP 150/72, HR 79.

## 2019-02-06 NOTE — Progress Notes (Signed)
Subjective:  Mr. Alesi says he is feeling well this morning. He says he is breathing well and denies chest pain and abdominal discomfort. He has a nonproductive cough. He was on BiPAP last night and 7L by nasal canula this morning, satting in the low 90s.   Received a page around 11:15 that Mr. Amirault's O2 sats had dropped into the 50s. He was placed back on BiPAP with improvement to 93%. At this time he still denied any shortness of breath, chest pain, and cough. Diffuse ronchi were heard bilaterally. He will receive guaifenesin bid and continue diuresis.    Objective:  Vital signs in last 24 hours: Vitals:   02/06/19 0055 02/06/19 0606 02/06/19 0817 02/06/19 0848  BP: (!) 157/69 (!) 155/71  (!) 174/68  Pulse: 61 (!) 59  66  Resp: (!) 27 19 (!) 23 (!) 22  Temp:  98.5 F (36.9 C)  98 F (36.7 C)  TempSrc:  Axillary  Oral  SpO2: 97% 98% 93% 91%  Weight:  105.2 kg    Height:       Weight change:   Intake/Output Summary (Last 24 hours) at 02/06/2019 1108 Last data filed at 02/06/2019 0850 Gross per 24 hour  Intake 860 ml  Output 3650 ml  Net -2790 ml   Physical Exam:  Gen: well-nourished elderly man. Not in any acute distress, laying in bed.  HEENT: NAT, MMM, no scleral icterus CV: RRR, no m/r/g noted. No JVD noted, +1 pitting edema in LE bilaterally.  Pulm: BiPAP in place. Diffuse ronchi bilaterally.  Abd: Distended, non-tender.  Neuro: Conversant with normal speech, moves all extremities without difficulty.  Psych: Alert, oriented to baseline.   EKG (8/17) shows sinus rhythm with premature atrial complexes  Abdominal X-ray (8/17) shows nonobstructive bowel gas pattern  Bedside US of chest (8/18) shows >3 B lines in lung fields bilaterally, small effusion at right lung base, enlarged left ventricle   Assessment/Plan:  Principal Problem:   Acute respiratory failure with hypoxia (HCC) Active Problems:   CAP (community acquired pneumonia)  Mr. Dilone has an  increased oxygen requirement and signs of fluid overload including LE edema, elevated JVD, and >3 B lines and evidence of small effusion on [place]. His acute respiratory failure is likely due to both his pneumonia and volume status resulting from HFrEF and decreased kidney function. He will continue on his course of antibiotics as he appears to be clinically responding with resolved fever and WBCs now within normal limits. We'll add Lasix for diuresis.   PNA:  - Fever resolved. T: 98.5 - WBCs 8.9 from 10.9 - Continue azithromycin and ceftriaxone - blood cultures x2 no growth at 4 days - Urinary strep antigen negative   HFrEF:  - +1 pitting edema in LE bilaterally - POCUS (8/18) shows >3 B lines in lung fields bilaterally, small effusion at the right base.  - Continue Lasix 40 mg bid  Acute respiratory failure with hypoxia:  - Placed on BiPAP again after decompensation to 50% O2 saturation on 7L nasal canula. Satting in low 90s currently.  - Diffuse ronchi bilaterally - Start guaifenesin bid, re-dose lasix - Wean oxygen as tolerated  Abdominal distension:  - Abdominal x-ray shows nonobstructive bowel gas pattern - Continue simethicone 80 mg bid  Edema of left hand: - likely IV line infiltration - continue to monitor for worsening of swelling  AKI on CKD III:  - Likely related to PNA - Creatinine to 2.43 from baseline ~1.8 -  2.0, BUN to 68 from 54 yesterday - Repeat UA shows small Hgb on dipstick, protein 100, uric acid crystals present  DM:  - SSI  Hypertension:  - On home clonidine, 0.2 mg patch - poorly controlled. Last recorded value 174/68. Monitor for improvement on diuresis. Consider restarting home amlodipine.   Anemia:  - Hgb 10.7, likely anemia of chronic disease.  - Continue to monitor  Dementia: - Monitor   DVT Prophylaxis: Lovenox 30 mg   LOS: 4 days   Kendell Bane, Medical Student 02/06/2019, 11:08 AM

## 2019-02-06 NOTE — Significant Event (Signed)
Rapid Response Event Note  Overview: Respiratory Distress  Initial Focused Assessment:  While rounding on unit, I saw Gregory Hatfield for respiratory distress, I saw a few days ago for this as well. Patient was placed on BIPAP 14/7, pulling VT 485, not distress now, stated that he felt better now. Lung sounds - diminished in the bases, rhonchi throughout. Current oxygen saturations 94% on  70% FiO2. Abdomen is distended but soft, per nurse this is ongoing.   Interventions: - None -   Plan of Care: - Monitor VS and respiratory status. - Wean BIPAP as patient tolerates   Event Summary:  Start Time 1140  End Time [place]

## 2019-02-06 NOTE — Progress Notes (Signed)
Paged for worsening hypoxia. Presented to bedside to evaluate the patient. He is resting comfortably on BiPAP. He denies SHOB, CP, or cough. Nursing reports being paged by central tele that the patient was saturating in the 60s. He is now saturating at 92%, other vital signs stable. On physical exam he has diffuse rhonchi. I do not think CXR would change management at this point.   - Continue BiPAP  - IV Furosemide at noon  - Guaifenesin BID  Ina Homes, MD  [facility] PGY3  Pager: [phone]

## 2019-02-07 LAB — GLUCOSE, CAPILLARY
Glucose-Capillary: 253 mg/dL — ABNORMAL HIGH (ref 70–99)
Glucose-Capillary: 214 mg/dL — ABNORMAL HIGH (ref 70–99)
Glucose-Capillary: 215 mg/dL — ABNORMAL HIGH (ref 70–99)
Glucose-Capillary: 210 mg/dL — ABNORMAL HIGH (ref 70–99)
Glucose-Capillary: 181 mg/dL — ABNORMAL HIGH (ref 70–99)

## 2019-02-07 LAB — BASIC METABOLIC PANEL
Anion gap: 14 (ref 5–15)
BUN: 68 mg/dL — ABNORMAL HIGH (ref 8–23)
CO2: 22 mmol/L (ref 22–32)
Calcium: 8.7 mg/dL — ABNORMAL LOW (ref 8.9–10.3)
Chloride: 111 mmol/L (ref 98–111)
Creatinine, Ser: 2.19 mg/dL — ABNORMAL HIGH (ref 0.61–1.24)
GFR calc Af Amer: 29 mL/min — ABNORMAL LOW (ref >60)
GFR calc non Af Amer: 25 mL/min — ABNORMAL LOW (ref >60)
Glucose, Bld: 196 mg/dL — ABNORMAL HIGH (ref 70–99)
Potassium: 3.7 mmol/L (ref 3.5–5.1)
Sodium: 147 mmol/L — ABNORMAL HIGH (ref 135–145)

## 2019-02-07 LAB — CULTURE, BLOOD (ROUTINE X 2)
Culture: NO GROWTH
Culture: NO GROWTH
Special Requests: ADEQUATE

## 2019-02-07 LAB — MAGNESIUM: Magnesium: 2.4 mg/dL (ref 1.7–2.4)

## 2019-02-07 MED ORDER — INSULIN ASPART PROT & ASPART (70-30 MIX) 100 UNIT/ML ~~LOC~~ SUSP
15.0000 [IU] | Freq: Two times a day (BID) | SUBCUTANEOUS | Status: DC
  Administered 2019-02-08 – 2019-02-11 (×7): 15 [IU] via SUBCUTANEOUS
  Filled 2019-02-07: qty 10

## 2019-02-07 MED ORDER — CARVEDILOL 6.25 MG PO TABS
6.2500 mg | ORAL_TABLET | Freq: Two times a day (BID) | ORAL | Status: DC
  Administered 2019-02-07 – 2019-02-12 (×10): 6.25 mg via ORAL
  Filled 2019-02-07 (×10): qty 1

## 2019-02-07 MED ORDER — INSULIN ASPART 100 UNIT/ML ~~LOC~~ SOLN
0.0000 [IU] | Freq: Every day | SUBCUTANEOUS | Status: DC
  Administered 2019-02-09: 22:00:00 3 [IU] via SUBCUTANEOUS
  Administered 2019-02-11: 4 [IU] via SUBCUTANEOUS

## 2019-02-07 MED ORDER — INSULIN ASPART 100 UNIT/ML ~~LOC~~ SOLN
0.0000 [IU] | Freq: Three times a day (TID) | SUBCUTANEOUS | Status: DC
  Administered 2019-02-07: 05:00:00 5 [IU] via SUBCUTANEOUS
  Administered 2019-02-08 (×2): 8 [IU] via SUBCUTANEOUS
  Administered 2019-02-08: 20:00:00 3 [IU] via SUBCUTANEOUS
  Administered 2019-02-09: 2 [IU] via SUBCUTANEOUS
  Administered 2019-02-09: 17:00:00 8 [IU] via SUBCUTANEOUS
  Administered 2019-02-09: 13:00:00 5 [IU] via SUBCUTANEOUS
  Administered 2019-02-10: 8 [IU] via SUBCUTANEOUS
  Administered 2019-02-10 (×2): 5 [IU] via SUBCUTANEOUS
  Administered 2019-02-11: 2 [IU] via SUBCUTANEOUS
  Administered 2019-02-11: 08:00:00 11 [IU] via SUBCUTANEOUS
  Administered 2019-02-11: 5 [IU] via SUBCUTANEOUS
  Administered 2019-02-12: 12:00:00 15 [IU] via SUBCUTANEOUS
  Administered 2019-02-12: 8 [IU] via SUBCUTANEOUS

## 2019-02-07 MED ORDER — FUROSEMIDE 40 MG PO TABS
40.0000 mg | ORAL_TABLET | Freq: Two times a day (BID) | ORAL | Status: DC
  Administered 2019-02-07 – 2019-02-12 (×10): 40 mg via ORAL
  Filled 2019-02-07 (×10): qty 1

## 2019-02-07 MED ORDER — HYDRALAZINE HCL 50 MG PO TABS
50.0000 mg | ORAL_TABLET | Freq: Three times a day (TID) | ORAL | Status: DC
  Administered 2019-02-07 – 2019-02-12 (×13): 50 mg via ORAL
  Filled 2019-02-07 (×14): qty 1

## 2019-02-07 MED ORDER — AMLODIPINE BESYLATE 10 MG PO TABS
10.0000 mg | ORAL_TABLET | Freq: Every day | ORAL | Status: DC
  Administered 2019-02-07 – 2019-02-12 (×6): 10 mg via ORAL
  Filled 2019-02-07 (×6): qty 1

## 2019-02-07 NOTE — Evaluation (Signed)
Physical Therapy Evaluation Patient Details Name: Gregory Hatfield MRN: [id number] DOB: 12-06-1925 Today's Date: 02/07/2019   History of Present Illness  85 yo male admitted to ED on 8/15 with CAP with resultant ShOB, AKI. PMH includes CHF, CKDIV, DM, dyslipidemia, Alzheimer's, prostate cancer, cervical and lumbar spine surgery.  Clinical Impression   Pt presents with dyspnea on exertion with accompanying tachypnea and desaturation of O2 requiring up to 5LO2, deconditioning, extreme difficulty performing bed mobility and transfers, and decreased activity tolerance. Pt to benefit from acute PT to address deficits. Pt required max-total assist to stand EOB, requiring much assist for positioning in bed and assisting pt with pericare post-standing EOB. PT recommending return to SNF level of care. PT to progress mobility as tolerated, and will continue to follow acutely.      Follow Up Recommendations SNF;Supervision/Assistance - 24 hour    Equipment Recommendations  None recommended by PT    Recommendations for Other Services       Precautions / Restrictions Precautions Precautions: Fall Restrictions Weight Bearing Restrictions: No      Mobility  Bed Mobility Overal bed mobility: Needs Assistance Bed Mobility: Supine to Sit;Sit to Supine;Rolling Rolling: Max assist   Supine to sit: Total assist Sit to supine: HOB elevated;Total assist   General bed mobility comments: max assist for rolling bilaterally for bed pad placement, pt with some UE initiation and reaching towards rails to assist. Total assist for supine<>sit for LE management, trunk elevation and lowering, and scooting and from EOB. Pt with little to no initiation to move to and from EOB.  Transfers Overall transfer level: Needs assistance Equipment used: Rolling walker (2 wheeled) Transfers: Sit to/from Stand Sit to Stand: Max assist;From elevated surface         General transfer comment: Sit to stand x2 from  EOB. Max assist for power up, facilitation for hip and trunk extension, steadying. Very increased time to rise, multimodal cuing for maintaining upright. Pt able to stand for up to 30 seconds before fatiguing, unable to take steps forward or backward. PT providing mod assist to slowly eccentrically lower pt to sitting from standing.  Ambulation/Gait Ambulation/Gait assistance: (NT - pt very unsteady in standing requiring max assist to achieve standing)              Stairs            Wheelchair Mobility    Modified Rankin (Stroke Patients Only)       Balance Overall balance assessment: Needs assistance Sitting-balance support: Bilateral upper extremity supported;Feet supported Sitting balance-Leahy Scale: Poor Sitting balance - Comments: pt with heavy posterior leaning, PT encouraging UE propping but only minimally effective. Requires PT for posture righting. Postural control: Posterior lean Standing balance support: Bilateral upper extremity supported Standing balance-Leahy Scale: Poor Standing balance comment: reliant on UE support and PT for steadying                             Pertinent Vitals/Pain Pain Assessment: No/denies pain    Home Living Family/patient expects to be discharged to:: Skilled nursing facility                 Additional Comments: Pt reports he came from [facility], chart review of ED notes confirms this    Prior Function Level of Independence: Needs assistance   Gait / Transfers Assistance Needed: pt reports using RW for ambulation, otherwise uses wheelchair  ADL's / Homemaking  Assistance Needed: Pt states he gets help with ADLs, pt does not provide specifics        Hand Dominance   Dominant Hand: Right    Extremity/Trunk Assessment   Upper Extremity Assessment Upper Extremity Assessment: Generalized weakness    Lower Extremity Assessment Lower Extremity Assessment: Generalized weakness    Cervical /  Trunk Assessment Cervical / Trunk Assessment: Normal  Communication   Communication: HOH(Pt states "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")  Cognition Arousal/Alertness: Awake/alert Behavior During Therapy: WFL for tasks assessed/performed Overall Cognitive Status: History of cognitive impairments - at baseline Area of Impairment: Orientation;Attention;Following commands;Safety/judgement;Problem solving                 Orientation Level: Disoriented to;Situation Current Attention Level: Sustained   Following Commands: Follows one step commands inconsistently Safety/Judgement: Decreased awareness of safety;Decreased awareness of deficits   Problem Solving: Slow processing;Decreased initiation;Difficulty sequencing;Requires verbal cues;Requires tactile cues General Comments: Pt unsure of why he is in the hospital, but states he comes from [place] and is oriented to self. Pt lacking awareness of mobility deficits, fecal and urinary incotinence during session multiple times.      General Comments General comments (skin integrity, edema, etc.): Pt on 3LO2 with nasal cannula off, sats 86-88%. PT placed [place] back on pt, sats did not recover >88%. PT placed pt on up to 5LO2 via HFNC during session to maintain sats >90%. Sats 84-95% on 3-5LO2, RR up to low 30s breaths/minute    Exercises     Assessment/Plan    PT Assessment Patient needs continued PT services  PT Problem List Decreased strength;Decreased mobility;Decreased safety awareness;Decreased range of motion;Decreased activity tolerance;Decreased balance;Decreased cognition;Decreased knowledge of use of DME;Pain       PT Treatment Interventions DME instruction;Therapeutic activities;Gait training;Therapeutic exercise;Patient/family education;Stair training;Balance training;Functional mobility training    PT Goals (Current goals can be found in the Care Plan section)  Acute Rehab PT Goals Patient Stated Goal: walk PT Goal  Formulation: With patient Time For Goal Achievement: 02/21/19 Potential to Achieve Goals: Good    Frequency Min 2X/week   Barriers to discharge        Co-evaluation               AM-PAC PT "6 Clicks" Mobility  Outcome Measure Help needed turning from your back to your side while in a flat bed without using bedrails?: Total Help needed moving from lying on your back to sitting on the side of a flat bed without using bedrails?: Total Help needed moving to and from a bed to a chair (including a wheelchair)?: Total Help needed standing up from a chair using your arms (e.g., wheelchair or bedside chair)?: Total Help needed to walk in hospital room?: Total Help needed climbing 3-5 steps with a railing? : Total 6 Click Score: 6    End of Session Equipment Utilized During Treatment: Oxygen;Gait belt Activity Tolerance: Patient limited by fatigue;Other (comment)(Dyspnea, desats, weakness) Patient left: in bed;with call bell/phone within reach(on low, air bed; all rails in up position) Nurse Communication: Other (comment)(unable to locate RN post-session) PT Visit Diagnosis: Difficulty in walking, not elsewhere classified (R26.2);Muscle weakness (generalized) (M62.81);Unsteadiness on feet (R26.81)    Time: 1219-7588 PT Time Calculation (min) (ACUTE ONLY): 40 min   Charges:   PT Evaluation $PT Eval Low Complexity: 1 Low PT Treatments $Therapeutic Activity: 23-37 mins        Camyla Camposano Conception Chancy, PT Acute Rehabilitation Services Pager [phone]  Office  [phone]   Cydnee Fuquay D Vernon Ariel 02/07/2019, 12:48 PM

## 2019-02-07 NOTE — Progress Notes (Addendum)
Subjective:  Gregory Hatfield says he is feeling well this morning. He is comfortable in bed, satting in the  90s on 2L nasal canula. He denies shortness of breath, chest pain, cough, headache, and abdominal pain. His only complaint today is thirst. His breath sounds are much improved from yesterday with only slight bibasilar crackles. His abdomen is still somewhat distended, but is also much improved and significantly softer. His pneumonia appears to be resolved and his fluid status improved with diuresis. He will be seen by physical therapy to assess for discharge.    Objective:  Vital signs in last 24 hours: Vitals:   02/07/19 0500 02/07/19 0810 02/07/19 0811 02/07/19 0843  BP:   (!) 156/77   Pulse:   63 65  Resp:   20 18  Temp:  98.8 F (37.1 C)    TempSrc:  Axillary    SpO2:   91% 97%  Weight: 103.9 kg     Height:       Weight change: -1.361 kg  Intake/Output Summary (Last 24 hours) at 02/07/2019 1031 Last data filed at 02/06/2019 2251 Gross per 24 hour  Intake 360 ml  Output 2150 ml  Net -1790 ml   Physical Exam:  Gen: well-nourished elderly man. Not in any acute distress, laying in bed.  HEENT: NAT, MMM, no scleral icterus CV: RRR, no m/r/g noted. No JVD noted.  Pulm: BiPAP in place. Slight bibasilar crackles.  Off bipap on my evaluation on 2L via [place] with stable O2 sat Abd: Soft, distended, non-tender.  Neuro: Conversant with normal speech, moves all extremities without difficulty.  Psych: Alert, oriented to baseline.   EKG (8/17) shows sinus rhythm with premature atrial complexes  Abdominal X-ray (8/17) shows nonobstructive bowel gas pattern  Bedside US of chest (8/18) shows >3 B lines in lung fields bilaterally, small effusion at right lung base, enlarged left ventricle   Assessment/Plan:  Principal Problem:   Acute respiratory failure with hypoxia (HCC) Active Problems:   CAP (community acquired pneumonia)  Gregory Hatfield has an increased oxygen requirement and  signs of fluid overload including LE edema, elevated JVD, and >3 B lines and evidence of small effusion on [place]. His acute respiratory failure is likely due to both his pneumonia and volume status resulting from HFrEF and decreased kidney function. He will continue on his course of antibiotics as he appears to be clinically responding with resolved fever and WBCs now within normal limits. Currently being diuresed with some improvement in oxygen requirement.   PNA:  - Fever resolved. - WBCs wnl - Day 5 of treatment with azithromycin and ceftriaxone. Given continued O2 requirement, continue treatment.   HFrEF:  - Continue Lasix 40 mg bid  Acute respiratory failure with hypoxia:  - Pt has consistently needed BiPAP at night, possible undiagnosed sleep apnea - recommend he follow up outpatient. (risk factors for both obstructive and central apena) - Currently satting in 90s on 2L by nasal canula - Continue diuresis with Lasix 40 mg bid change back to oral 40mg  BID home dose (near euvolemia) - Continue guaifenesin 600 mg bid - Wean oxygen as tolerated   Abdominal distension:  - Abdominal x-ray shows nonobstructive bowel gas pattern - Continue simethicone 80 mg bid - Improvement noted today. Less distended and much softer.   Hypertension:  - On home clonidine, 0.2 mg patch - Poorly controlled. Last recorded value 156/77, max in last 24 hours 189/92. Monitor for improvement on diuresis. Labetalol 5 mg q2h prn  Resume home amlodipine, coreg, and hydralazine  AKI on CKD III:  - Creatinine downtrending since starting diuresis 2.19 <-- 2.23 <-- 2.43 <-- 2.56 - BUN stably elevated. 69 today.   Edema of left hand: - likely IV line infiltration, resolved   DM:  - SSI  - change frequency to TID+HS, may continue moderate scale, restart Novolog at reduced dose 15 units BID  Anemia:  - Hgb 10.7, likely anemia of chronic disease.  - Continue to monitor   Dementia: - Monitor   Dispo: Pneumonia  is resolving, O2 requirement improving. Physical therapy will see him to evaluate placement for discharge.     DVT Prophylaxis: Lovenox 30 mg   LOS: 5 days   Gregory Hatfield, Medical Student 02/07/2019, 10:31 AM

## 2019-02-07 NOTE — Progress Notes (Signed)
Inpatient Diabetes Program Recommendations  AACE/ADA: New Consensus Statement on Inpatient Glycemic Control (2015)  Target Ranges:  Prepandial:   less than 140 mg/dL      Peak postprandial:   less than 180 mg/dL (1-2 hours)      Critically ill patients:  140 - 180 mg/dL   Lab Results  Component Value Date   GLUCAP 215 (H) 02/07/2019   HGBA1C 8.6 (H) 02/02/2019    Review of Glycemic Control  Diabetes history: DM2 Outpatient Diabetes medications: 70/30 insulin mix 52 units am + 12 units pm Current orders for Inpatient glycemic control: Novolog moderate correction q 4 hrs  Inpatient Diabetes Program Recommendations:   While home insulin dose held, Consider starting -Lantus 10 units daily (0.1 unit/kg x 103.9 kg) -Change Novolog correction to sensitive tid  Thank you, Nani Gasser. Nolyn Swab, RN, MSN, CDE  Diabetes Coordinator Inpatient Glycemic Control Team Team Pager [phone] (8am-5pm) 02/07/2019 1:49 PM

## 2019-02-08 LAB — SARS CORONAVIRUS 2 BY RT PCR (HOSPITAL ORDER, PERFORMED IN ~~LOC~~ HOSPITAL LAB): SARS Coronavirus 2: NEGATIVE

## 2019-02-08 LAB — GLUCOSE, CAPILLARY
Glucose-Capillary: 176 mg/dL — ABNORMAL HIGH (ref 70–99)
Glucose-Capillary: 206 mg/dL — ABNORMAL HIGH (ref 70–99)
Glucose-Capillary: 242 mg/dL — ABNORMAL HIGH (ref 70–99)
Glucose-Capillary: 256 mg/dL — ABNORMAL HIGH (ref 70–99)
Glucose-Capillary: 290 mg/dL — ABNORMAL HIGH (ref 70–99)

## 2019-02-08 NOTE — Progress Notes (Signed)
SATURATION QUALIFICATIONS: (This note is used to comply with regulatory documentation for home oxygen)  Patient Saturations on Room Air at Rest = 81%  Patient Saturations on Room Air while Ambulating = not ambulatory/refuses%  Patient Saturations on Liters of oxygen while Ambulating = not ambulatory/refuses% not ambulatory/refuses%  Please briefly explain why patient needs home oxygen: Gross desaturation on RA; requires oxygen to maintain sats above 90%

## 2019-02-08 NOTE — Discharge Summary (Signed)
Name: Gregory Hatfield MRN: [id number] DOB: 03/19/1926 85 y.o. PCP: Patient, No Pcp Per  Date of Admission: 02/02/2019  7:13 AM Date of Discharge: 02/08/2019 Attending Physician: Lucious Groves, DO  Discharge Diagnosis: 1. Community Acquired Pneumonia  2. HFpEF 3. Nocturnal Desaturation   Discharge Medications: Allergies as of 02/08/2019      Reactions   Ace Inhibitors Other (See Comments), Cough   Chronic cough   Demerol Other (See Comments)   Hard to wake up   Cosopt [dorzolamide Hcl-timolol Mal] Other (See Comments)   Cause heart rate to drop   Flomax [tamsulosin Hcl] Other (See Comments)   Low BP   Morphine And Related Other (See Comments)   hallucinations   Oxytrol [oxybutynin] Rash      Medication List    STOP taking these medications   cephALEXin 500 MG capsule Commonly known as: KEFLEX   divalproex 125 MG capsule Commonly known as: DEPAKOTE SPRINKLE   feeding supplement (ENSURE ENLIVE) Liqd   geriatric multivitamins-minerals Liqd   haloperidol 2 MG tablet Commonly known as: HALDOL   OLANZapine 5 MG tablet Commonly known as: ZYPREXA   senna-docusate 8.6-50 MG tablet Commonly known as: Senokot-S     TAKE these medications   acetaminophen 650 MG CR tablet Commonly known as: TYLENOL Take 650 mg by mouth every 8 (eight) hours as needed for pain.   amLODipine 10 MG tablet Commonly known as: NORVASC Take 1 tablet (10 mg total) by mouth daily.   aspirin 81 MG tablet Take 81 mg by mouth daily.   B-D UF III MINI PEN NEEDLES 31G X 5 MM Misc Generic drug: Insulin Pen Needle USE FOR INJECTIONS AS DIRECTED   carvedilol 6.25 MG tablet Commonly known as: COREG TAKE 1 TABLET BY MOUTH TWICE DAILY WITH A MEAL. What changed: See the new instructions.   cloNIDine 0.2 mg/24hr patch Commonly known as: CATAPRES - Dosed in mg/24 hr Place 1 patch (0.2 mg total) onto the skin once a week. What changed: when to take this   diclofenac sodium 1 % Gel  Commonly known as: VOLTAREN Apply 4 g topically 4 (four) times daily.   furosemide 20 MG tablet Commonly known as: LASIX Take 40 mg by mouth 2 (two) times daily.   HumuLIN 70/30 (70-30) 100 UNIT/ML injection Generic drug: insulin NPH-regular Human Inject 52 Units into the skin daily with breakfast AND 12 Units daily with supper. 44 units with breakfast, and 10 units with supper.. What changed: See the new instructions.   hydrALAZINE 50 MG tablet Commonly known as: APRESOLINE Take 1 tablet (50 mg total) by mouth every 8 (eight) hours.   latanoprost 0.005 % ophthalmic solution Commonly known as: XALATAN Place 1 drop into the right eye at bedtime.   loperamide 2 MG tablet Commonly known as: IMODIUM A-D Take 2 mg by mouth every 6 (six) hours as needed for diarrhea or loose stools.   loratadine 10 MG tablet Commonly known as: CLARITIN Take 10 mg by mouth daily.   multivitamin tablet Take 1 tablet by mouth daily.   Myrbetriq 50 MG Tb24 tablet Generic drug: mirabegron ER Take 50 mg by mouth daily.   ondansetron 4 MG tablet Commonly known as: Zofran Take 1 tablet (4 mg total) by mouth daily as needed for nausea or vomiting.   ONE TOUCH ULTRA TEST test strip Generic drug: glucose blood CHECK BLOOD SUGAR 6 TIMES A DAY.   potassium chloride SA 20 MEQ tablet Commonly known as: K-DUR  Take 1 tablet (20 mEq total) by mouth daily.   saccharomyces boulardii 250 MG capsule Commonly known as: FLORASTOR Take 250 mg by mouth daily.   Simbrinza 1-0.2 % Susp Generic drug: Brinzolamide-Brimonidine Place 1 drop into both eyes 3 (three) times daily.   simethicone 80 MG chewable tablet Commonly known as: MYLICON Chew 80 mg by mouth every 6 (six) hours as needed for flatulence.   SYSTANE OP Place 1 drop into both eyes every 8 (eight) hours as needed (dry eyes).   Vitamin D3 250 MCG (10000 UT) capsule Take 10,000 Units by mouth See admin instructions. Take 1 tablet (10,000 units  totally) by mouth on odd days   Vitamin D3 125 MCG (5000 UT) Caps Take 5,000 Units by mouth See admin instructions. Take 1 tablet (5,000 units totally) by mouth on even days            Durable Medical Equipment  (From admission, onward)         Start     Ordered   02/08/19 1425  DME Oxygen  Once    Question Answer Comment  Length of Need 6 Months   Mode or (Route) Nasal cannula   Liters per Minute 4   Frequency Continuous (stationary and portable oxygen unit needed)   Oxygen conserving device No   Oxygen delivery system Gas      02/08/19 1424          Disposition and follow-up:   Mr.Horald W Markgraf was discharged from [facility] in Stable condition.  At the hospital follow up visit please address:  1.  CAP. Continue to diurese the patient and work towards getting the patient off oxygen therapy.   2.  Labs / imaging needed at time of follow-up: None  3.  Pending labs/ test needing follow-up: None  Follow-up Appointments: Patient was discharged to a SNF and will continue to follow with them.   Hospital Course by problem list:  1. Community Acquired Pneumonia. Gregory Hatfield is a 85 year old male with dementia, type II diabetes, CKD stage IV, heart failure with preserved ejection fraction, and hypertension who presented to the emergency department on 8/15 with fevers and shortness of breath. Chest x-ray illustrated her right lower lobe capacity. He was subsequently admitted for further evaluation and management of community acquired pneumonia. He was started on ceftriaxone and azithromycin. He subsequently defervescence and his oxygen requirements improved. He was treated for total of six days with IV antibiotics. He was still requiring oxygen on discharge however this should improve with continuation of his oral Lasix and improvement in his community acquired pneumonia.   2. HFpEF. Patient with known heart failure preserved ejection fraction. With  improvement in his community acquired pneumonia he still required intermittent BiPAP. He was subsequently diuresis with IV furosemide. Over the course of his hospitalization he diuresis was not -6.7 L.   3. Nocturnal Desaturation. While hospitalized it was noted that the patient frequently desaturated at night and would require increased pulmonary oxygen. Patient is at risk for both central and obstructive sleep apnea. To definitively say he may need a sleep study versus empiric use of noninvasive ventilation or supplemental oxygen.  Discharge Vitals:   BP (!) 142/65 (BP Location: Left Arm)   Pulse 65   Temp 98 F (36.7 C) (Oral)   Resp 17   Ht 6' (1.829 m)   Wt 107 kg   SpO2 93%   BMI 32.01 kg/m   Pertinent Labs, Studies, and  Procedures:  None  Discharge Instructions: Discharge Instructions    (HEART FAILURE PATIENTS) Call MD:  Anytime you have any of the following symptoms: 1) 3 pound weight gain in 24 hours or 5 pounds in 1 week 2) shortness of breath, with or without a dry hacking cough 3) swelling in the hands, feet or stomach 4) if you have to sleep on extra pillows at night in order to breathe.   Complete by: As directed    Diet - low sodium heart healthy   Complete by: As directed    Increase activity slowly   Complete by: As directed     Signed: Ina Homes, MD 02/08/2019, 2:24 PM

## 2019-02-08 NOTE — Evaluation (Signed)
Occupational Therapy Evaluation and Discharge Patient Details Name: Gregory Hatfield MRN: [id number] DOB: 13-May-1926 Today's Date: 02/08/2019    History of Present Illness 85 yo male admitted to ED on 8/15 with CAP with resultant ShOB, AKI. PMH includes CHF, CKDIV, DM, dyslipidemia, Alzheimer's, prostate cancer, cervical and lumbar spine surgery.   Clinical Impression   Pt is able to self feed and is otherwise dependent in ADL. He reports he walks with a RW or uses a w/c. Pt demonstrates weakness and poor sitting balance with baseline impairment in cognition. Limited session as IV RN arrived during session. Per chart, pt to d/c today back to ALF, will defer further OT to next venue of care.    Follow Up Recommendations  SNF    Equipment Recommendations       Recommendations for Other Services       Precautions / Restrictions Precautions Precautions: Fall Restrictions Weight Bearing Restrictions: No      Mobility Bed Mobility Overal bed mobility: Needs Assistance Bed Mobility: Supine to Sit;Sit to Supine     Supine to sit: Total assist Sit to supine: HOB elevated;Total assist   General bed mobility comments: minimal initiation, requires assist for all aspects  Transfers                 General transfer comment: did not attempt, IV RN came    Balance Overall balance assessment: Needs assistance Sitting-balance support: Bilateral upper extremity supported;Feet supported Sitting balance-Leahy Scale: Poor Sitting balance - Comments: heavy posterior lean, unable to self correct without max assist Postural control: Posterior lean     Standing balance comment: did not attempt                           ADL either performed or assessed with clinical judgement   ADL Overall ADL's : At baseline                                       General ADL Comments: pt set up to eat, he is dependent in ADL at baseline     Vision Baseline  Vision/History: Wears glasses Wears Glasses: At all times Patient Visual Report: No change from baseline       Perception     Praxis      Pertinent Vitals/Pain Pain Assessment: Faces Faces Pain Scale: No hurt     Hand Dominance Right   Extremity/Trunk Assessment Upper Extremity Assessment Upper Extremity Assessment: Generalized weakness(limited shoulder ROM, pt reports is long standing)   Lower Extremity Assessment Lower Extremity Assessment: Defer to PT evaluation       Communication Communication Communication: Expressive difficulties;HOH   Cognition Arousal/Alertness: Awake/alert Behavior During Therapy: Flat affect Overall Cognitive Status: History of cognitive impairments - at baseline                                 General Comments: pt disoriented, slow to respond to commands, repeats himself   General Comments       Exercises     Shoulder Instructions      Home Living Family/patient expects to be discharged to:: Skilled nursing facility  Additional Comments: pt is a resident of [name]      Prior Functioning/Environment Level of Independence: Needs assistance  Gait / Transfers Assistance Needed: pt reports using RW for ambulation, otherwise uses wheelchair ADL's / Homemaking Assistance Needed: pt is assisted for all ADL with exception of self feeding            OT Problem List: Decreased strength;Decreased activity tolerance;Impaired balance (sitting and/or standing);Decreased coordination;Decreased cognition;Decreased knowledge of use of DME or AE      OT Treatment/Interventions:      OT Goals(Current goals can be found in the care plan section) Acute Rehab OT Goals Patient Stated Goal: walk  OT Frequency:     Barriers to D/C:            Co-evaluation              AM-PAC OT "6 Clicks" Daily Activity     Outcome Measure Help from another person eating meals?: A  Little Help from another person taking care of personal grooming?: A Lot Help from another person toileting, which includes using toliet, bedpan, or urinal?: Total Help from another person bathing (including washing, rinsing, drying)?: Total Help from another person to put on and taking off regular upper body clothing?: A Lot Help from another person to put on and taking off regular lower body clothing?: Total 6 Click Score: 10   End of Session    Activity Tolerance: Patient tolerated treatment well Patient left: in bed;with call bell/phone within reach;with bed alarm set;with nursing/sitter in room  OT Visit Diagnosis: Muscle weakness (generalized) (M62.81);Other symptoms and signs involving cognitive function                Time: 1353-1409 OT Time Calculation (min): 16 min Charges:  OT General Charges $OT Visit: 1 Visit OT Evaluation $OT Eval Moderate Complexity: 1 Mod  Malka So 02/08/2019, 2:54 PM  Nestor Lewandowsky, OTR/L Acute Rehabilitation Services Pager: [phone] Office: [phone]

## 2019-02-08 NOTE — TOC Initial Note (Signed)
Transition of Care [facility]) - Initial/Assessment Note    Patient Details  Name: Gregory Hatfield MRN: [id number] Date of Birth: January 20, 1926  Transition of Care [facility]) CM/SW Contact:    Eileen Stanford, LCSW Phone Number: 02/08/2019, 2:46 PM  Clinical Narrative: Pt only alert to self and place. CSW spoke with pt's daughter/HCPOA Velva Harman. Pt's daughter confirmed pt came from [place]. CSW explained that PT is recommending SNF level of care--pt's daughter agreeable. Pt's daughter agreeable to pt going to [facility]. CSW working with facility to get pt returned.                  Expected Discharge Plan: Skilled Nursing Facility Barriers to Discharge: No Barriers Identified   Patient Goals and CMS Choice Patient states their goals for this hospitalization and ongoing recovery are:: "to get better"   Choice offered to / list presented to : Adult Children  Expected Discharge Plan and Services Expected Discharge Plan: [place] In-house Referral: Clinical Social Work   Post Acute Care Choice: NA Living arrangements for the past 2 months: [place] Expected Discharge Date: 02/08/19                                    Prior Living Arrangements/Services Living arrangements for the past 2 months: [place] Lives with:: Self Patient language and need for interpreter reviewed:: Yes Do you feel safe going back to the place where you live?: Yes      Need for Family Participation in Patient Care: Yes (Comment) Care giver support system in place?: Yes (comment)   Criminal Activity/Legal Involvement Pertinent to Current Situation/Hospitalization: No - Comment as needed  Activities of Daily Living      Permission Sought/Granted Permission sought to share information with : Family Supports    Share Information with NAME: Farron Watrous  Permission granted to share info w AGENCY: [place] granted to share info w  Relationship: Daughter     Emotional Assessment Appearance:: Appears stated age Attitude/Demeanor/Rapport: Unable to Assess Affect (typically observed): Unable to Assess Orientation: : Oriented to Self, Oriented to Place Alcohol / Substance Use: Not Applicable Psych Involvement: No (comment)  Admission diagnosis:  Acute respiratory failure with hypoxia ([place]) [J96.01] Community acquired pneumonia, unspecified laterality [J18.9] Patient Active Problem List   Diagnosis Date Noted  . Acute respiratory failure with hypoxia ([place])   . CAP (community acquired pneumonia) 02/02/2019  . Hypokalemia 04/20/2018  . Syncope 04/19/2018  . Acute lower UTI 02/09/2018  . Acute encephalopathy 02/09/2018  . UTI (urinary tract infection) 02/09/2018  . Metabolic encephalopathy 94/49/6759  . Pressure injury of skin 02/09/2018  . Peripheral edema   . Chronic diastolic CHF (congestive heart failure) ([place]) 01/16/2018  . Symptomatic bradycardia 10/06/2016  . Diabetes mellitus with complication ([place]) 16/38/4665  . Acute on chronic diastolic heart failure ([place]) 10/06/2016  . Hyperlipidemia 01/12/2016  . Prostate cancer ([place]) 10/12/2015  . Diabetic retinopathy ([place]) 10/12/2015  . Goals of care, counseling/discussion 10/12/2015  . Open-angle glaucoma 07/06/2015  . CKD stage 4 due to type 2 diabetes mellitus ([place]) 06/03/2015  . Vitamin D deficiency 06/03/2015  . Medication management 06/03/2015  . Insulin-requiring or dependent type II diabetes mellitus ([place]) 05/05/2015  . Essential hypertension 05/05/2015  . SDAT (senile dementia of Alzheimer's type) ([place]) 06/16/2013   PCP:  Patient, No Pcp Per Pharmacy:  No Pharmacies Listed  Social Determinants of Health (SDOH) Interventions    Readmission Risk Interventions No flowsheet data found.

## 2019-02-08 NOTE — TOC Progression Note (Addendum)
Transition of Care [facility]) - Progression Note    Patient Details  Name: Gregory Hatfield MRN: [id number] Date of Birth: 09/13/1925  Transition of Care [facility]) CM/SW Contact  Eileen Stanford, LCSW Phone Number: 02/08/2019, 4:01 PM  Clinical Narrative:  Pt can not d/c to ALF until 02 is delivered. Nanine Means gets 02 through [employer]. 02 set up through [employer] however will not be delivered until 10:30 PM-1:00 AM therefore pt can not transfer to ALF until tomorrow. MD aware, RN aware, and pt's daughter aware.    Expected Discharge Plan: [place] Barriers to Discharge: No Barriers Identified  Expected Discharge Plan and Services Expected Discharge Plan: [place] In-house Referral: Clinical Social Work   Post Acute Care Choice: NA Living arrangements for the past 2 months: [place] Expected Discharge Date: 02/08/19                                     Social Determinants of Health (SDOH) Interventions    Readmission Risk Interventions No flowsheet data found.

## 2019-02-08 NOTE — NC FL2 (Addendum)
[place] LEVEL OF CARE SCREENING TOOL     IDENTIFICATION  Patient Name: Gregory Hatfield Birthdate: July 08, 1925 Sex: male Admission Date (Current Location): 02/02/2019  [facility] and [place] Number:  Herbalist and Address:  [facility], [place] [address] [place], [place] [address]      Provider Number: [id number]  Attending Physician Name and Address:  Lucious Groves, DO  Relative Name and Phone Number:       Current Level of Care: Hospital Recommended Level of Care: [place] Prior Approval Number:    Date Approved/Denied:   PASRR Number: [id number]  Discharge Plan: SNF    Current Diagnoses: Patient Active Problem List   Diagnosis Date Noted  . Acute respiratory failure with hypoxia ([place])   . CAP (community acquired pneumonia) 02/02/2019  . Hypokalemia 04/20/2018  . Syncope 04/19/2018  . Acute lower UTI 02/09/2018  . Acute encephalopathy 02/09/2018  . UTI (urinary tract infection) 02/09/2018  . Metabolic encephalopathy 80/99/8338  . Pressure injury of skin 02/09/2018  . Peripheral edema   . Chronic diastolic CHF (congestive heart failure) ([place]) 01/16/2018  . Symptomatic bradycardia 10/06/2016  . Diabetes mellitus with complication ([place]) 25/10/3974  . Acute on chronic diastolic heart failure ([place]) 10/06/2016  . Hyperlipidemia 01/12/2016  . Prostate cancer ([place]) 10/12/2015  . Diabetic retinopathy ([place]) 10/12/2015  . Goals of care, counseling/discussion 10/12/2015  . Open-angle glaucoma 07/06/2015  . CKD stage 4 due to type 2 diabetes mellitus ([place]) 06/03/2015  . Vitamin D deficiency 06/03/2015  . Medication management 06/03/2015  . Insulin-requiring or dependent type II diabetes mellitus ([place]) 05/05/2015  . Essential hypertension 05/05/2015  . SDAT (senile dementia of Alzheimer's type) ([place]) 06/16/2013    Orientation RESPIRATION BLADDER Height & Weight     Self, Place  O2(Nasal Cannula 4L)  External catheter, Incontinent(placed 02/02/19) Weight: 236 lb (107 kg) Height:  6' (182.9 cm)  BEHAVIORAL SYMPTOMS/MOOD NEUROLOGICAL BOWEL NUTRITION STATUS      Incontinent Diet: No added salt  AMBULATORY STATUS COMMUNICATION OF NEEDS Skin   Extensive Assist Verbally PU Stage and Appropriate Care, Skin abrasions(open wound on left leg, Impregnated gauze (petrolatum) dressing)   PU Stage 2 Dressing: (located on buttocks)                   Personal Care Assistance Level of Assistance  Bathing, Feeding, Dressing Bathing Assistance: Maximum assistance Feeding assistance: Limited assistance Dressing Assistance: Maximum assistance     Functional Limitations Info  Sight, Hearing, Speech Sight Info: Adequate Hearing Info: Adequate Speech Info: Adequate    SPECIAL CARE FACTORS FREQUENCY  PT (By licensed PT), OT (By licensed OT)     PT Frequency: 5x OT Frequency: 5x            Contractures Contractures Info: Not present    Additional Factors Info  Code Status, Allergies Code Status Info: Full Code Allergies Info: Ace Inhibitors, Demerol, Cosopt (Dorzolamide Hcl-timolol Mal), Flomax (Tamsulosin Hcl), Morphine And Related, Oxytrol (Oxybutynin)           Current Medications (02/08/2019):  This is the current hospital active medication list Current Facility-Administered Medications  Medication Dose Route Frequency Provider Last Rate Last Dose  . acetaminophen (TYLENOL) tablet 650 mg  650 mg Oral Q6H PRN Ina Homes, MD       Or  . acetaminophen (TYLENOL) suppository 650 mg  650 mg Rectal Q6H PRN Ina Homes, MD      .  amLODipine (NORVASC) tablet 10 mg  10 mg Oral Daily Maudie Mercury, MD   10 mg at 02/08/19 0853  . azithromycin (ZITHROMAX) tablet 250 mg  250 mg Oral Daily Joni Reining C, DO   250 mg at 02/08/19 0947  . carvedilol (COREG) tablet 6.25 mg  6.25 mg Oral BID WC Maudie Mercury, MD   6.25 mg at 02/08/19 0850  . cefTRIAXone (ROCEPHIN) 1 g in sodium  chloride 0.9 % 100 mL IVPB  1 g Intravenous Q24H Helberg, Justin, MD 200 mL/hr at 02/07/19 1204 1 g at 02/07/19 1204  . enoxaparin (LOVENOX) injection 30 mg  30 mg Subcutaneous Q24H Helberg, Justin, MD   30 mg at 02/07/19 1648  . furosemide (LASIX) tablet 40 mg  40 mg Oral BID Ina Homes, MD   40 mg at 02/08/19 0850  . Gerhardt's butt cream   Topical QID Gilles Chiquito B, MD      . guaiFENesin [facility]) 12 hr tablet 600 mg  600 mg Oral BID Ina Homes, MD   600 mg at 02/08/19 0850  . hydrALAZINE (APRESOLINE) tablet 50 mg  50 mg Oral Q8H Maudie Mercury, MD   50 mg at 02/08/19 0520  . insulin aspart (novoLOG) injection 0-15 Units  0-15 Units Subcutaneous TID WC Maudie Mercury, MD   8 Units at 02/08/19 0900  . insulin aspart (novoLOG) injection 0-5 Units  0-5 Units Subcutaneous QHS Maudie Mercury, MD      . insulin aspart protamine- aspart (NOVOLOG MIX 70/30) injection 15 Units  15 Units Subcutaneous BID WC Maudie Mercury, MD   15 Units at 02/08/19 0902  . labetalol (NORMODYNE) injection 5 mg  5 mg Intravenous Q2H PRN Ina Homes, MD   5 mg at 02/06/19 1736  . mirabegron ER (MYRBETRIQ) tablet 50 mg  50 mg Oral Daily Ina Homes, MD   50 mg at 02/08/19 0851  . ondansetron (ZOFRAN) tablet 4 mg  4 mg Oral Q6H PRN Ina Homes, MD       Or  . ondansetron (ZOFRAN) injection 4 mg  4 mg Intravenous Q6H PRN Ina Homes, MD   4 mg at 02/08/19 0859  . simethicone (MYLICON) chewable tablet 80 mg  80 mg Oral BID Ina Homes, MD   80 mg at 02/08/19 0962     Discharge Medications: Please see discharge summary for a list of discharge medications.  Relevant Imaging Results:  Relevant Lab Results:   Additional Information [id number]  Gerrianne Scale Cardale Dorer, LCSW

## 2019-02-08 NOTE — Progress Notes (Signed)
Subjective:  Mr. Stansbury says he is feeling well this morning. He is comfortable in bed, with stable O2 on 4L nasal canula. He denies shortness of breath, chest pain, cough, headache, and abdominal pain. He appears euvolemic. Lungs are clear, and there is no LE edema or visible JVD. His oxygen requirements have improved. He is recommended for discharge to SNF by social work.   Paged this morning about difficulty taking oral medications due to choking. SLP will evaluate.    Objective:  Vital signs in last 24 hours: Vitals:   02/08/19 0521 02/08/19 0651 02/08/19 0652 02/08/19 0906  BP: (!) 142/65   (!) 142/65  Pulse: 64 66 66 65  Resp: (!) 26 19 17    Temp:    98 F (36.7 C)  TempSrc:    Oral  SpO2: 91% 93% 93% 93%  Weight:      Height:       Weight change: 3.175 kg  Intake/Output Summary (Last 24 hours) at 02/08/2019 1128 Last data filed at 02/08/2019 0530 Gross per 24 hour  Intake -  Output 1775 ml  Net -1775 ml   Physical Exam:  Gen: well-nourished elderly man. Not in any acute distress, laying in bed.  HEENT: NAT, MMM, no scleral icterus CV: RRR, no m/r/g noted. No JVD noted. No LE edema Pulm: Stable O2 on 4L nasal canula Abd: Soft, distended, non-tender.  Neuro: Conversant with normal speech, moves all extremities without difficulty.  Psych: Alert, oriented to baseline.   EKG (8/17) shows sinus rhythm with premature atrial complexes  Abdominal X-ray (8/17) shows nonobstructive bowel gas pattern  Bedside US of chest (8/18) shows >3 B lines in lung fields bilaterally, small effusion at right lung base, enlarged left ventricle   Assessment/Plan:  Principal Problem:   Acute respiratory failure with hypoxia (HCC) Active Problems:   CAP (community acquired pneumonia)  Mr. Colaizzi has an increased oxygen requirement and signs of fluid overload including LE edema, elevated JVD, and >3 B lines and evidence of small effusion on [place]. His acute respiratory failure is  likely due to both his pneumonia and volume status resulting from HFrEF and decreased kidney function. Currently being diuresed with improvement in oxygen requirement. Pneumonia appears to be resolved. We will discontinue antibiotic therapy.   PNA:  - Resolved. D/c antibiotics.   HFrEF:  - Continue Lasix 40 mg bid  Acute respiratory failure with hypoxia:  - Possible undiagnosed sleep apnea (risk factors for obstructive and central), but patient did not require BiPAP last night.  - Currently satting in 90s on 4L by nasal canula - Oral Lasix 40 mg bid - Continue guaifenesin 600 mg bid - Wean oxygen as tolerated   Abdominal distension:  - Abdominal x-ray shows nonobstructive bowel gas pattern - Continue simethicone 80 mg bid   Hypertension:  - On home clonidine, 0.2 mg patch - Resumed home amlodipine, coreg, and hydralazine (8/20)  AKI on CKD III:  - Creatinine downtrending since starting diuresis 2.19 <-- 2.23 <-- 2.43 <-- 2.56 - BUN stably elevated. 69 today.   Edema of left hand: - likely IV line infiltration, resolved   DM:  - SSI - Change frequency to TID+HS, may continue moderate scale, restart Novolog at reduced dose 15 units BID  Anemia:  - Hgb 10.7, likely anemia of chronic disease.  - Continue to monitor   Dementia: - Monitor   Dispo: Pneumonia resolved, O2 requirement improved. Recommended for discharge to SNF.     DVT  Prophylaxis: Lovenox 30 mg   LOS: 6 days   Kendell Bane, Medical Student 02/08/2019, 11:28 AM

## 2019-02-08 NOTE — Progress Notes (Signed)
Patient with frequent coughing while eating and taking PO medications with probable aspiration.  Precautions taken, MD aware and SLP consult ordered, still awaiting consult.

## 2019-02-09 ENCOUNTER — Inpatient Hospital Stay (HOSPITAL_COMMUNITY): Payer: Medicare Other

## 2019-02-09 LAB — GLUCOSE, CAPILLARY
Glucose-Capillary: 110 mg/dL — ABNORMAL HIGH (ref 70–99)
Glucose-Capillary: 177 mg/dL — ABNORMAL HIGH (ref 70–99)
Glucose-Capillary: 242 mg/dL — ABNORMAL HIGH (ref 70–99)
Glucose-Capillary: 282 mg/dL — ABNORMAL HIGH (ref 70–99)
Glucose-Capillary: 259 mg/dL — ABNORMAL HIGH (ref 70–99)

## 2019-02-09 MED ORDER — SODIUM CHLORIDE 0.9 % IV SOLN
INTRAVENOUS | Status: DC | PRN
  Administered 2019-02-09 – 2019-02-10 (×2): 250 mL via INTRAVENOUS

## 2019-02-09 MED ORDER — RESOURCE THICKENUP CLEAR PO POWD
ORAL | Status: DC | PRN
  Filled 2019-02-09: qty 125

## 2019-02-09 NOTE — Plan of Care (Signed)
  Problem: Coping: Goal: Level of anxiety will decrease Outcome: Progressing Note: No s/s of anxiety noted.   Problem: Pain Managment: Goal: General experience of comfort will improve Outcome: Progressing Note: No s/s of pain or discomfort.

## 2019-02-09 NOTE — Progress Notes (Signed)
Modified Barium Swallow Progress Note  Patient Details  Name: Gregory Hatfield MRN: [id number] Date of Birth: [birth year]  Today's Date: 02/09/2019  Modified Barium Swallow completed.  Full report located under Chart Review in the Imaging Section.  Brief recommendations include the following:  Clinical Impression  Pt demosntrates a moderate orophayngeal dysphagia with decreased bolus cohesion leading to ill timed instances of partial bolus transit with thin liquids. Pt aspirated with sensation both before and after the initial swallow with thin liquid as pt transited oral residue but does not initiate a timely swallow response to protect airway. This occurs even when rate/size of bolsues are controlled externally. Note that delayed and unrelated coughing also occurred during study. Pt cognitively is not capable of positional strategies. Pt tolerated nectar thick liquids even taken consecutively without difficulty and was also able to masticate regular solids. Recommend an unrestricted solid diet with nectar thick liquids. SLP to f/u at next level of care as pt to d/c today.    Swallow Evaluation Recommendations       SLP Diet Recommendations: Regular solids;Nectar thick liquid   Liquid Administration via: Cup;Straw   Medication Administration: Whole meds with liquid   Supervision: Patient able to self feed   Compensations: Minimize environmental distractions   Postural Changes: Seated upright at 90 degrees;Remain semi-upright after after feeds/meals (Comment)   Oral Care Recommendations: Oral care BID   Other Recommendations: Order thickener from [place], [place] Pager [phone] Office [phone]   Othelia Pulling, Katherene Ponto 02/09/2019,12:35 PM

## 2019-02-09 NOTE — Progress Notes (Signed)
[name] called me for report, they have  a bed at the ALF not SNF. Per Education officer, museum and also per discharge note, disposition is to SNF. Patient unable to discharge due no bed at SNF. MD made aware.

## 2019-02-09 NOTE — Plan of Care (Signed)
Patient to be transferred to [facility], Waiting for oxygen to be delivered. I Will call call PTAR once oxygen is delivered

## 2019-02-09 NOTE — Progress Notes (Signed)
SLP  Note  Patient Details Name: Gregory Hatfield MRN: [id number] DOB: 1926/04/23  Pt seen earlier this admission by SLP at bedside. Given ongoing concern from staff regarding aspiration, will proceed to instrumental assessment prior to d/c as further observation at bedside will not yield objective result.  Herbie Baltimore, MA CCC-SLP  Acute Rehabilitation Services Pager [phone] Office [phone]  Lynann Beaver 02/09/2019, 8:39 AM

## 2019-02-09 NOTE — Progress Notes (Signed)
Pts BIPAP order is PRN. Pt not in need of BIPAP at this time. RT will continue to monitor.

## 2019-02-09 NOTE — Progress Notes (Signed)
Social Worker informed of oxygen delivery at [employer]

## 2019-02-09 NOTE — Discharge Summary (Signed)
Name: Gregory Hatfield MRN: [id number] DOB: [birth year] 85 y.o. PCP: Patient, No Pcp Per  Date of Admission: 02/02/2019  7:13 AM Date of Discharge: 02/08/2019 Attending Physician: Oda Kilts, MD  Discharge Diagnosis: 1. Community Acquired Pneumonia  2. HFpEF 3. Nocturnal Desaturation  4. Oropharyngeal dysphagia.   Discharge Medications: Allergies as of 02/09/2019      Reactions   Ace Inhibitors Other (See Comments), Cough   Chronic cough   Demerol Other (See Comments)   Hard to wake up   Cosopt [dorzolamide Hcl-timolol Mal] Other (See Comments)   Cause heart rate to drop   Flomax [tamsulosin Hcl] Other (See Comments)   Low BP   Morphine And Related Other (See Comments)   hallucinations   Oxytrol [oxybutynin] Rash      Medication List    STOP taking these medications   cephALEXin 500 MG capsule Commonly known as: KEFLEX   divalproex 125 MG capsule Commonly known as: DEPAKOTE SPRINKLE   feeding supplement (ENSURE ENLIVE) Liqd   geriatric multivitamins-minerals Liqd   haloperidol 2 MG tablet Commonly known as: HALDOL   OLANZapine 5 MG tablet Commonly known as: ZYPREXA   senna-docusate 8.6-50 MG tablet Commonly known as: Senokot-S     TAKE these medications   acetaminophen 650 MG CR tablet Commonly known as: TYLENOL Take 650 mg by mouth every 8 (eight) hours as needed for pain.   amLODipine 10 MG tablet Commonly known as: NORVASC Take 1 tablet (10 mg total) by mouth daily.   aspirin 81 MG tablet Take 81 mg by mouth daily.   B-D UF III MINI PEN NEEDLES 31G X 5 MM Misc Generic drug: Insulin Pen Needle USE FOR INJECTIONS AS DIRECTED   carvedilol 6.25 MG tablet Commonly known as: COREG TAKE 1 TABLET BY MOUTH TWICE DAILY WITH A MEAL. What changed: See the new instructions.   cloNIDine 0.2 mg/24hr patch Commonly known as: CATAPRES - Dosed in mg/24 hr Place 1 patch (0.2 mg total) onto the skin once a week. What changed: when to take this    diclofenac sodium 1 % Gel Commonly known as: VOLTAREN Apply 4 g topically 4 (four) times daily.   furosemide 20 MG tablet Commonly known as: LASIX Take 40 mg by mouth 2 (two) times daily.   HumuLIN 70/30 (70-30) 100 UNIT/ML injection Generic drug: insulin NPH-regular Human Inject 52 Units into the skin daily with breakfast AND 12 Units daily with supper. 44 units with breakfast, and 10 units with supper.. What changed: See the new instructions.   hydrALAZINE 50 MG tablet Commonly known as: APRESOLINE Take 1 tablet (50 mg total) by mouth every 8 (eight) hours.   latanoprost 0.005 % ophthalmic solution Commonly known as: XALATAN Place 1 drop into the right eye at bedtime.   loperamide 2 MG tablet Commonly known as: IMODIUM A-D Take 2 mg by mouth every 6 (six) hours as needed for diarrhea or loose stools.   loratadine 10 MG tablet Commonly known as: CLARITIN Take 10 mg by mouth daily.   multivitamin tablet Take 1 tablet by mouth daily.   Myrbetriq 50 MG Tb24 tablet Generic drug: mirabegron ER Take 50 mg by mouth daily.   ondansetron 4 MG tablet Commonly known as: Zofran Take 1 tablet (4 mg total) by mouth daily as needed for nausea or vomiting.   ONE TOUCH ULTRA TEST test strip Generic drug: glucose blood CHECK BLOOD SUGAR 6 TIMES A DAY.   potassium chloride SA 20 MEQ tablet  Commonly known as: K-DUR Take 1 tablet (20 mEq total) by mouth daily.   saccharomyces boulardii 250 MG capsule Commonly known as: FLORASTOR Take 250 mg by mouth daily.   Simbrinza 1-0.2 % Susp Generic drug: Brinzolamide-Brimonidine Place 1 drop into both eyes 3 (three) times daily.   simethicone 80 MG chewable tablet Commonly known as: MYLICON Chew 80 mg by mouth every 6 (six) hours as needed for flatulence.   SYSTANE OP Place 1 drop into both eyes every 8 (eight) hours as needed (dry eyes).   Vitamin D3 250 MCG (10000 UT) capsule Take 10,000 Units by mouth See admin instructions.  Take 1 tablet (10,000 units totally) by mouth on odd days   Vitamin D3 125 MCG (5000 UT) Caps Take 5,000 Units by mouth See admin instructions. Take 1 tablet (5,000 units totally) by mouth on even days            Durable Medical Equipment  (From admission, onward)         Start     Ordered   02/08/19 1425  DME Oxygen  Once    Question Answer Comment  Length of Need 6 Months   Mode or (Route) Nasal cannula   Liters per Minute 4   Frequency Continuous (stationary and portable oxygen unit needed)   Oxygen conserving device No   Oxygen delivery system Gas      02/08/19 1424        Disposition and follow-up:   Gregory Hatfield was discharged from [facility] in Stable condition.  At the hospital follow up visit please address:  1.  CAP. Continue to diurese the patient and work towards getting the patient off oxygen therapy.   2.  Labs / imaging needed at time of follow-up: None  3.  Pending labs/ test needing follow-up: None  Follow-up Appointments: Patient was discharged to a SNF and will continue to follow with them.   Hospital Course by problem list:  1. Community Acquired Pneumonia. Gregory Hatfield is a 85 year old male with dementia, type II diabetes, CKD stage IV, heart failure with preserved ejection fraction, and hypertension who presented to the emergency department on 8/15 with fevers and shortness of breath. Chest x-ray illustrated her right lower lobe capacity. He was subsequently admitted for further evaluation and management of community acquired pneumonia. He was started on ceftriaxone and azithromycin. He subsequently defervescence and his oxygen requirements improved. He was treated for total of six days with IV antibiotics. He was still requiring oxygen on discharge however this should improve with continuation of his oral Lasix and improvement in his community acquired pneumonia.   2. HFpEF. Patient with known heart failure preserved  ejection fraction. With improvement in his community acquired pneumonia he still required intermittent BiPAP. He was subsequently diuresis with IV furosemide. Over the course of his hospitalization he diuresis was not -6.7 L.   3. Nocturnal Desaturation. While hospitalized it was noted that the patient frequently desaturated at night and would require increased pulmonary oxygen. Patient is at risk for both central and obstructive sleep apnea. To definitively say he may need a sleep study versus empiric use of noninvasive ventilation or supplemental oxygen.  4. Oropharyngeal dysphagia. Pt demosntrates a moderate orophayngeal dysphagia with decreased bolus cohesion leading to ill timed instances of partial bolus transit with thin liquids. Pt aspirated with sensation both before and after the initial swallow with thin liquid as pt transited oral residue but does not initiate a timely swallow  response to protect airway. This occurs even when rate/size of bolsues are controlled externally. Note that delayed and unrelated coughing also occurred during study. Pt cognitively is not capable of positional strategies. Pt tolerated nectar thick liquids even taken consecutively without difficulty and was also able to masticate regular solids. Recommend an unrestricted solid diet with nectar thick liquids. SLP to f/u at next level of care as pt to d/c today.   Discharge Vitals:   BP (!) 134/57   Pulse 63   Temp (!) 97.5 F (36.4 C) (Oral)   Resp 20   Ht 6' (1.829 m)   Wt 105.7 kg   SpO2 91%   BMI 31.60 kg/m   Pertinent Labs, Studies, and Procedures:  None  Discharge Instructions: Discharge Instructions    (HEART FAILURE PATIENTS) Call MD:  Anytime you have any of the following symptoms: 1) 3 pound weight gain in 24 hours or 5 pounds in 1 week 2) shortness of breath, with or without a dry hacking cough 3) swelling in the hands, feet or stomach 4) if you have to sleep on extra pillows at night in order to  breathe.   Complete by: As directed    Diet - low sodium heart healthy   Complete by: As directed    Increase activity slowly   Complete by: As directed     Signed: Ina Homes, MD 02/09/2019, 1:49 PM

## 2019-02-09 NOTE — Progress Notes (Signed)
Patient discharged yesterday. I saw him in person and chart review. He remains hemodynamically stable on 4 L per minute nasal cannula. He does not have any questions or concerns today. His discharge orders and summary have already been completed.  Speech has been asked to reevaluate him for possible aspiration. He will get a modified barium swallow today prior to discharge.   He remains stable for discharge.   Ina Homes, MD  [facility] PGY3  Pager: [phone]

## 2019-02-10 ENCOUNTER — Other Ambulatory Visit: Payer: Self-pay

## 2019-02-10 ENCOUNTER — Encounter (HOSPITAL_COMMUNITY): Payer: Self-pay | Admitting: *Deleted

## 2019-02-10 LAB — GLUCOSE, CAPILLARY
Glucose-Capillary: 179 mg/dL — ABNORMAL HIGH (ref 70–99)
Glucose-Capillary: 188 mg/dL — ABNORMAL HIGH (ref 70–99)
Glucose-Capillary: 216 mg/dL — ABNORMAL HIGH (ref 70–99)
Glucose-Capillary: 227 mg/dL — ABNORMAL HIGH (ref 70–99)
Glucose-Capillary: 248 mg/dL — ABNORMAL HIGH (ref 70–99)
Glucose-Capillary: 267 mg/dL — ABNORMAL HIGH (ref 70–99)

## 2019-02-10 NOTE — Progress Notes (Signed)
   Subjective: Patient is feeling well this morning. He states that he is not having any issues with his breathing. He just finished eating breakfast. We discussed that we are waiting to get him to a skilled nursing facility for further rehab.  Nursing did note desaturation overnight.  Objective: Vital signs in last 24 hours: Vitals:   02/10/19 0355 02/10/19 0526 02/10/19 0740 02/10/19 0921  BP: (!) 133/57 (!) 151/50 (!) 166/53 (!) 168/71  Pulse: 63  60   Resp: 17  (!) 21   Temp: 98 F (36.7 C)  (!) 97.4 F (36.3 C)   TempSrc: Axillary  Oral   SpO2: 96%     Weight: 103 kg     Height:       General: obese male in no acute distress CV: RRR, no murmurs, no rubs   Assessment/Plan:  Taivon Haroon is a 85 year old male with dementia, type II diabetes, CKD stage IV, heart failure with preserved ejection fraction, and hypertension who presented to the emergency department on 8/15 with fevers and shortness of breath. Chest x-ray illustrated her right lower lobe capacity. He was subsequently admitted for further evaluation and management of community acquired pneumonia.  Community acquired pneumonia - Remains afebrile - Has completed a course of ceftriaxone and azithromycin - Remains on 4 L per minute nasal cannula  HFpEF. - Continue lasix 40 mg BID  Nocturnal Desaturation.  - Patient is at risk for both central and obstructive sleep apnea.  - To definitively say he may need a sleep study versus empiric use of noninvasive ventilation or supplemental oxygen.  Oropharyngeal dysphagia.  - Unrestricted solid diet with nectar thick liquids.  - SLP to f/u at next level of care   Dispo: Patient remains medically stable for discharge when a skilled nursing facility bed is available.  Ina Homes, MD 02/10/2019, 10:26 AM

## 2019-02-10 NOTE — TOC Progression Note (Addendum)
Transition of Care [facility]) - Progression Note    Patient Details  Name: Gregory Hatfield MRN: [id number] Date of Birth: [birth year]  Transition of Care [facility]) CM/SW [place], [place] Phone Number: [phone] 02/10/2019, 9:48 AM  Clinical Narrative:    CSW received a call from [place] later in the afternoon to state that patient could return due to oxygen being delivered.CSW spoke to patient daughter over the phone and she stated the previous CSW mentioned a rehab facility. In further review of the chart, CSW noticed that patient is being recommeded for a SNF. CSW called facility 3 times to ensure that facility could meet current needs of patient. CSW did not get a call back.  CSW has reached out today to [place] facility as well and had to leave a message.  CSW will continue to monitor for discharge planning.    Expected Discharge Plan: [place] Barriers to Discharge: No Barriers Identified  Expected Discharge Plan and Services Expected Discharge Plan: [place] In-house Referral: Clinical Social Work   Post Acute Care Choice: NA Living arrangements for the past 2 months: [place] Expected Discharge Date: 02/08/19                                     Social Determinants of Health (SDOH) Interventions    Readmission Risk Interventions No flowsheet data found.

## 2019-02-10 NOTE — TOC Progression Note (Signed)
Transition of Care [facility]) - Progression Note    Patient Details  Name: Gregory Hatfield MRN: [id number] Date of Birth: Oct 30, 1925  Transition of Care [facility]) CM/SW [place], [place] Phone Number: [phone] 02/10/2019, 4:18 PM  Clinical Narrative:     CSW had a lengthy conversation with patient's daughter Velva Harman in regards to her father's discharge plan. Initially, it was decided that patient would return to [facility] however to due patient's ambulatory status Velva Harman would like patient to go to SNF. Velva Harman stated that patient has been to [facility] and [facility] and they are her top 2 choices however she is amenable to which facility has a bed offer. Velva Harman gave CSW permission to fax out to local facilities.  CSW notified RN of new discharge plan.   CSW will continue to monitor for discharge planning. Expected Discharge Plan: [place] Barriers to Discharge: No Barriers Identified  Expected Discharge Plan and Services Expected Discharge Plan: [place] In-house Referral: Clinical Social Work   Post Acute Care Choice: NA Living arrangements for the past 2 months: [place] Expected Discharge Date: 02/08/19                                     Social Determinants of Health (SDOH) Interventions    Readmission Risk Interventions No flowsheet data found.

## 2019-02-11 DIAGNOSIS — R1312 Dysphagia, oropharyngeal phase: Secondary | ICD-10-CM

## 2019-02-11 LAB — GLUCOSE, CAPILLARY
Glucose-Capillary: 317 mg/dL — ABNORMAL HIGH (ref 70–99)
Glucose-Capillary: 212 mg/dL — ABNORMAL HIGH (ref 70–99)
Glucose-Capillary: 184 mg/dL — ABNORMAL HIGH (ref 70–99)
Glucose-Capillary: 301 mg/dL — ABNORMAL HIGH (ref 70–99)

## 2019-02-11 MED ORDER — INSULIN ASPART PROT & ASPART (70-30 MIX) 100 UNIT/ML ~~LOC~~ SUSP
15.0000 [IU] | Freq: Every day | SUBCUTANEOUS | Status: DC
  Filled 2019-02-11: qty 10

## 2019-02-11 MED ORDER — INSULIN ASPART PROT & ASPART (70-30 MIX) 100 UNIT/ML ~~LOC~~ SUSP
20.0000 [IU] | Freq: Every day | SUBCUTANEOUS | Status: DC
  Administered 2019-02-12: 09:00:00 20 [IU] via SUBCUTANEOUS
  Filled 2019-02-11: qty 10

## 2019-02-11 NOTE — Progress Notes (Signed)
Physical Therapy Treatment Patient Details Name: Gregory Hatfield MRN: [id number] DOB: 1926/04/10 Today's Date: 02/11/2019    History of Present Illness 85 yo male admitted to ED on 8/15 with CAP with resultant SOB, AKI. PMH includes CHF, CKD IV, DM, dyslipidemia, Alzheimer's, prostate cancer, cervical and lumbar spine surgery.    PT Comments    Pt pleasant asking for water throughout session. Pt able to progress mobility to include standing and pivot to chair with use of stedy. Pt incontinent of stool on arrival and again in standing with pt aware but unable to control bowels. Pt following multimodal cues with increased time and assist. Will continue to follow and recommend daily mobility with nursing.   HR 59 SpO2 88-95% on 5L     Follow Up Recommendations  SNF;Supervision/Assistance - 24 hour     Equipment Recommendations  None recommended by PT    Recommendations for Other Services       Precautions / Restrictions Precautions Precautions: Fall Precaution Comments: incontinent of stool    Mobility  Bed Mobility Overal bed mobility: Needs Assistance Bed Mobility: Supine to Sit     Supine to sit: Max assist     General bed mobility comments: pt assisting with pivoting legs to EOB with mod multimodal cues, max assist to elevate trunk with increased time to stabilize and reliance on bil UE support EOB. Min assist to scoot fully to EOB  Transfers Overall transfer level: Needs assistance   Transfers: Sit to/from [employer] Transfers Sit to Stand: Mod assist;+2 physical assistance;From elevated surface Stand pivot transfers: Mod assist;+2 safety/equipment       General transfer comment: mod +2 assist to stand with use of Stedy from elevated bed. pt incontinent of stool in standing and able to stand grossly 1 min to complete void and pericare prior to sitting. Pt then stood again with stedy and transferred to chair  Ambulation/Gait                  Stairs             Wheelchair Mobility    Modified Rankin (Stroke Patients Only)       Balance Overall balance assessment: Needs assistance Sitting-balance support: Bilateral upper extremity supported;Feet supported Sitting balance-Leahy Scale: Poor Sitting balance - Comments: posterior lean with min assist to maintain sitting EOB, bil UE on stedy pt does not require seated assist   Standing balance support: Bilateral upper extremity supported Standing balance-Leahy Scale: Poor                              Cognition Arousal/Alertness: Awake/alert Behavior During Therapy: Flat affect Overall Cognitive Status: History of cognitive impairments - at baseline Area of Impairment: Memory;Following commands                 Orientation Level: Disoriented to;Situation Current Attention Level: Sustained Memory: Decreased short-term memory Following Commands: Follows one step commands inconsistently Safety/Judgement: Decreased awareness of safety;Decreased awareness of deficits   Problem Solving: Slow processing;Decreased initiation;Difficulty sequencing;Requires verbal cues;Requires tactile cues        Exercises      General Comments        Pertinent Vitals/Pain Pain Assessment: No/denies pain    Home Living                      Prior Function  PT Goals (current goals can now be found in the care plan section) Progress towards PT goals: Progressing toward goals    Frequency    Min 2X/week      PT Plan Current plan remains appropriate    Co-evaluation              AM-PAC PT "6 Clicks" Mobility   Outcome Measure  Help needed turning from your back to your side while in a flat bed without using bedrails?: Total Help needed moving from lying on your back to sitting on the side of a flat bed without using bedrails?: Total Help needed moving to and from a bed to a chair (including a wheelchair)?: Total Help  needed standing up from a chair using your arms (e.g., wheelchair or bedside chair)?: A Lot Help needed to walk in hospital room?: Total Help needed climbing 3-5 steps with a railing? : Total 6 Click Score: 7    End of Session Equipment Utilized During Treatment: Oxygen;Gait belt Activity Tolerance: Patient tolerated treatment well Patient left: in chair;with call bell/phone within reach;with chair alarm set Nurse Communication: Mobility status;Need for lift equipment PT Visit Diagnosis: Difficulty in walking, not elsewhere classified (R26.2);Muscle weakness (generalized) (M62.81);Unsteadiness on feet (R26.81)     Time: 9643-8381 PT Time Calculation (min) (ACUTE ONLY): 21 min  Charges:  $Therapeutic Activity: 8-22 mins                     [place], PT Acute Rehabilitation Services Pager: [phone] Office: [phone]    Gregory Hatfield Gregory Hatfield 02/11/2019, 12:47 PM

## 2019-02-11 NOTE — TOC Progression Note (Signed)
Transition of Care [facility]) - Progression Note    Patient Details  Name: Gregory Hatfield MRN: [id number] Date of Birth: 08-31-1925  Transition [facility] Care [facility]) CM/SW Contact  Eileen Stanford, LCSW Phone Number: 02/11/2019, 9:32 AM  Clinical Narrative:  Originally pt was going to return to ALF with their in house PT. However, it appears that CSW has worked with pt's daughter over the weekend and has worked pt up for SNF. Pt has been accepted at [facility]. [place] will start insurance auth.    Expected Discharge Plan: [place] Barriers to Discharge: No Barriers Identified  Expected Discharge Plan and Services Expected Discharge Plan: [place] In-house Referral: Clinical Social Work   Post Acute Care Choice: NA Living arrangements for the past 2 months: [place] Expected Discharge Date: 02/08/19                                     Social Determinants of Health (SDOH) Interventions    Readmission Risk Interventions No flowsheet data found.

## 2019-02-11 NOTE — Progress Notes (Signed)
   Subjective:  Gregory Hatfield says he is feeling well this morning. He is comfortable in bed, with stable O2 on 4L nasal canula. He denies shortness of breath. He has a good appetite. He has just been approved for placement at [facility] for SNF level care.    Objective:  Vital signs in last 24 hours: Vitals:   02/11/19 0543 02/11/19 0610 02/11/19 0909 02/11/19 0911  BP: 121/63   138/63  Pulse:  60 62   Resp:  16 20   Temp:   (!) 97.5 F (36.4 C)   TempSrc:   Oral   SpO2:  91% 92%   Weight: 78.5 kg     Height:       Weight change: 0 kg  Intake/Output Summary (Last 24 hours) at 02/11/2019 1000 Last data filed at 02/11/2019 0600 Gross per 24 hour  Intake 480 ml  Output 1200 ml  Net -720 ml   Physical Exam:  Gen: well-nourished elderly man. Not in any acute distress, laying in bed.  HEENT: NAT, MMM, no scleral icterus CV: RRR, no m/r/g noted. No JVD noted. No LE edema Pulm: Stable O2 on 4L nasal canula Abd: Soft, distended, non-tender.  Neuro: Conversant with normal speech, moves all extremities without difficulty.  Psych: Alert, oriented to baseline.    Assessment/Plan:  Principal Problem:   Acute respiratory failure with hypoxia (HCC) Active Problems:   CAP (community acquired pneumonia)  Gregory Hatfield is a 85 y.o male with dementia, DM II, CKD, HTN, and HFpEF who presented to the ED on 8/15 with fever and shortness of breath. CXR showed RLL opacity, and he was admitted for treatment of community acquired pneumonia. He received a 6 day course of azithromycin and ceftriaxone. Continued respiratory failure secondary to CHf and fluid overload was improved with Lasix.   PNA:  - Resolved. D/c antibiotics.   HFrEF:  - Continue Lasix 40 mg bid  Nocturnal Desaturation:  - Patient at risk for central and obstructive sleep apnea. May need sleep study in outpatient setting.    Oropharyngeal dysphagia:  - Unrestricted solids with nectar thick liquids - SLP to follow up at next  level of care  Abdominal distension:  - Abdominal x-ray shows nonobstructive bowel gas pattern - Continue simethicone 80 mg bid   Hypertension:  - Well-controlled on home regimen amlodipine and carvedilol  DM:  - SSI - Change frequency to TID+HS, may continue moderate scale, restart Novolog at reduced dose 15 units BID  Anemia:  - Hgb 10.7, likely anemia of chronic disease.  - Continue to monitor   Dementia: - Monitor   Dispo: Patient is medically stable and will be discharged to a SNF today.    DVT Prophylaxis: Lovenox 30 mg   LOS: 9 days   Gregory Hatfield, Medical Student 02/11/2019, 10:00 AM

## 2019-02-11 NOTE — Care Management Important Message (Signed)
Important Message  Patient Details  Name: Gregory Hatfield MRN: [id number] Date of Birth: 14-May-1926   Medicare Important Message Given:  Yes     [name] 02/11/2019, 1:09 PM

## 2019-02-11 NOTE — Progress Notes (Signed)
Inpatient Diabetes Program Recommendations  AACE/ADA: New Consensus Statement on Inpatient Glycemic Control (2015)  Target Ranges:  Prepandial:   less than 140 mg/dL      Peak postprandial:   less than 180 mg/dL (1-2 hours)      Critically ill patients:  140 - 180 mg/dL   Lab Results  Component Value Date   GLUCAP 212 (H) 02/11/2019   HGBA1C 8.6 (H) 02/02/2019    Review of Glycemic Control Results for BOYSIE, BONEBRAKE (MRN [id number]) as of 02/11/2019 12:31  Ref. Range 02/10/2019 11:02 02/10/2019 16:04 02/10/2019 21:19 02/11/2019 07:54 02/11/2019 11:30  Glucose-Capillary Latest Ref Range: 70 - 99 mg/dL 267 (H) 227 (H) 188 (H) 317 (H) 212 (H)   Diabetes history:DM2 Outpatient Diabetes medications:70/30 insulin mix 52 units am + 12 units pm Current orders for Inpatient glycemic control: Novolog 70/30 insulin mix 15 units bid + Novolog moderate correction q 4 hrs  Inpatient Diabetes Program Recommendations:   -Increase am 70/30 insulin dose to 20 units  Thank you, Bethena Roys E. Catarino Vold, RN, MSN, CDE  Diabetes Coordinator Inpatient Glycemic Control Team Team Pager [phone] (8am-5pm) 02/11/2019 12:30 PM

## 2019-02-12 DIAGNOSIS — E1165 Type 2 diabetes mellitus with hyperglycemia: Secondary | ICD-10-CM

## 2019-02-12 DIAGNOSIS — D631 Anemia in chronic kidney disease: Secondary | ICD-10-CM

## 2019-02-12 DIAGNOSIS — R197 Diarrhea, unspecified: Secondary | ICD-10-CM

## 2019-02-12 DIAGNOSIS — Z888 Allergy status to other drugs, medicaments and biological substances status: Secondary | ICD-10-CM

## 2019-02-12 DIAGNOSIS — N184 Chronic kidney disease, stage 4 (severe): Secondary | ICD-10-CM

## 2019-02-12 DIAGNOSIS — Z885 Allergy status to narcotic agent status: Secondary | ICD-10-CM

## 2019-02-12 LAB — GLUCOSE, CAPILLARY
Glucose-Capillary: 272 mg/dL — ABNORMAL HIGH (ref 70–99)
Glucose-Capillary: 288 mg/dL — ABNORMAL HIGH (ref 70–99)
Glucose-Capillary: 352 mg/dL — ABNORMAL HIGH (ref 70–99)

## 2019-02-12 MED ORDER — LOPERAMIDE HCL 2 MG PO CAPS: 4.0000 mg | ORAL_CAPSULE | Freq: Once | ORAL | Status: DC

## 2019-02-12 NOTE — TOC Transition Note (Addendum)
Transition of Care [facility]) - CM/SW Discharge Note   Patient Details  Name: Gregory Hatfield MRN: [id number] Date of Birth: 1925/12/16  Transition of Care [facility]) CM/SW Contact:  Eileen Stanford, LCSW Phone Number: 02/12/2019, 10:19 AM   Clinical Narrative:  Clinical Social Worker facilitated patient discharge including contacting patient family and facility to confirm patient discharge plans.  Clinical information faxed to facility and family agreeable with plan.  CSW arranged ambulance transport via [place] (3:30) to [facility] (room 111P) .  RN to call [phone] for report prior to discharge.    Final next level of care: Skilled Nursing Facility Barriers to Discharge: No Barriers Identified   Patient Goals and CMS Choice Patient states their goals for this hospitalization and ongoing recovery are:: "to get better"   Choice offered to / list presented to : Adult Children  Discharge Placement              Patient chooses bed at: [facility] Patient to be transferred to facility by: [place] Name of family member notified: Velva Harman Patient and family notified of of transfer: 02/12/19  Discharge Plan and Services In-house Referral: Clinical Social Work   Post Acute Care Choice: NA                               Social Determinants of Health (SDOH) Interventions     Readmission Risk Interventions No flowsheet data found.

## 2019-02-12 NOTE — Progress Notes (Addendum)
   Subjective:  Mr. Kuss was resting comfortably this morning. He is stable on 5L nasal canula, no sign of volume overload, and lung sounds are clear. He is being discharged to [facility] this afternoon.    Objective:  Vital signs in last 24 hours: Vitals:   02/11/19 2156 02/11/19 2157 02/12/19 0527 02/12/19 0559  BP:  116/65 128/60 130/66  Pulse: (!) 57  70   Resp: (!) 21  (!) 24   Temp: 97.6 F (36.4 C)     TempSrc: Oral     SpO2: 91%  (!) 88%   Weight:    103 kg  Height:       Weight change: 0 kg  Intake/Output Summary (Last 24 hours) at 02/12/2019 1023 Last data filed at 02/12/2019 0553 Gross per 24 hour  Intake 102.38 ml  Output 601 ml  Net -498.62 ml   Physical Exam:  Gen: well-nourished elderly man. Not in any acute distress, laying in bed.  HEENT: NAT, MMM, no scleral icterus CV: RRR, no m/r/g noted. No JVD noted. No LE edema Pulm: Stable O2 on 5L nasal canula Abd: Soft, distended, non-tender.  Neuro: Conversant with normal speech, moves all extremities without difficulty.  Psych: Alert, oriented to baseline.    Assessment/Plan:  Principal Problem:   Acute respiratory failure with hypoxia (HCC) Active Problems:   CAP (community acquired pneumonia)  Saivion Goettel is a 85 y.o male with dementia, DM II, CKD, HTN, and HFpEF who presented to the ED on 8/15 with fever and shortness of breath. CXR showed RLL opacity, and he was admitted for treatment of community acquired pneumonia. He received a 6 day course of azithromycin and ceftriaxone. Continued respiratory failure secondary to CHF and fluid overload was improved with Lasix. He is stable on 4-5L nasal canula and ready for discharge. He is recommended to follow up with sleep study due to frequent nocturnal desaturation during his hospital course.   PNA:  - Resolved. S/P 6 days of ceftriaxone +azithromycin antibiotics.   HFrEF:  - Continue Lasix 40 mg bid  Nocturnal Desaturation:  - Patient at  risk for central and obstructive sleep apnea. May need sleep study in outpatient setting.    Oropharyngeal dysphagia:  - Unrestricted solids with nectar thick liquids - SLP to follow up at next level of care  Abdominal distension:  - Abdominal x-ray shows nonobstructive bowel gas pattern - Continue simethicone 80 mg bid   Diarrhea:  - Imodium 4 mg, follow with 2mg  prn   Hypertension:  - Well-controlled on home regimen amlodipine and carvedilol  DM Type 2 with Hyperglycemia:  - SSI - Increased AM Novolog 70/30 to 20 units, continue PM dose at 15 units.   Anemia of Chronic Disease:  - Hgb 10.7, likely anemia of chronic disease.  - Continue to monitor   Dementia: - Monitor   Dispo: Patient is medically stable and will be discharged to a SNF today.    DVT Prophylaxis: Lovenox 30 mg   LOS: 10 days   Kendell Bane, Medical Student 02/12/2019, 10:23 AM

## 2019-02-14 ENCOUNTER — Telehealth: Payer: Self-pay | Admitting: *Deleted

## 2019-02-14 NOTE — Telephone Encounter (Signed)
Gregory Hatfield is in a skilled Nursing Home.

## 2019-02-18 ENCOUNTER — Inpatient Hospital Stay (HOSPITAL_COMMUNITY)
Admission: EM | Admit: 2019-02-18 | Discharge: 2019-02-21 | DRG: 871 | Disposition: A | Discharge status: Hospice | Payer: Medicare Other | Attending: Family Medicine | Admitting: Family Medicine

## 2019-02-18 ENCOUNTER — Emergency Department (HOSPITAL_COMMUNITY): Payer: Medicare Other

## 2019-02-18 ENCOUNTER — Encounter (HOSPITAL_COMMUNITY): Payer: Self-pay

## 2019-02-18 ENCOUNTER — Other Ambulatory Visit: Payer: Self-pay

## 2019-02-18 ENCOUNTER — Inpatient Hospital Stay (HOSPITAL_COMMUNITY): Payer: Medicare Other

## 2019-02-18 DIAGNOSIS — E1122 Type 2 diabetes mellitus with diabetic chronic kidney disease: Secondary | ICD-10-CM | POA: Diagnosis present

## 2019-02-18 DIAGNOSIS — N184 Chronic kidney disease, stage 4 (severe): Secondary | ICD-10-CM | POA: Diagnosis present

## 2019-02-18 DIAGNOSIS — Z87891 Personal history of nicotine dependence: Secondary | ICD-10-CM

## 2019-02-18 DIAGNOSIS — G301 Alzheimer's disease with late onset: Secondary | ICD-10-CM | POA: Diagnosis present

## 2019-02-18 DIAGNOSIS — Z96642 Presence of left artificial hip joint: Secondary | ICD-10-CM | POA: Diagnosis present

## 2019-02-18 DIAGNOSIS — E11649 Type 2 diabetes mellitus with hypoglycemia without coma: Secondary | ICD-10-CM | POA: Diagnosis present

## 2019-02-18 DIAGNOSIS — G9341 Metabolic encephalopathy: Secondary | ICD-10-CM | POA: Diagnosis present

## 2019-02-18 DIAGNOSIS — Z515 Encounter for palliative care: Secondary | ICD-10-CM | POA: Diagnosis present

## 2019-02-18 DIAGNOSIS — Z794 Long term (current) use of insulin: Secondary | ICD-10-CM | POA: Diagnosis not present

## 2019-02-18 DIAGNOSIS — I447 Left bundle-branch block, unspecified: Secondary | ICD-10-CM | POA: Diagnosis present

## 2019-02-18 DIAGNOSIS — I5032 Chronic diastolic (congestive) heart failure: Secondary | ICD-10-CM | POA: Diagnosis present

## 2019-02-18 DIAGNOSIS — R1310 Dysphagia, unspecified: Secondary | ICD-10-CM | POA: Diagnosis present

## 2019-02-18 DIAGNOSIS — J690 Pneumonitis due to inhalation of food and vomit: Secondary | ICD-10-CM | POA: Diagnosis present

## 2019-02-18 DIAGNOSIS — Z66 Do not resuscitate: Secondary | ICD-10-CM | POA: Diagnosis present

## 2019-02-18 DIAGNOSIS — I959 Hypotension, unspecified: Secondary | ICD-10-CM | POA: Diagnosis present

## 2019-02-18 DIAGNOSIS — E870 Hyperosmolality and hypernatremia: Secondary | ICD-10-CM | POA: Diagnosis not present

## 2019-02-18 DIAGNOSIS — E785 Hyperlipidemia, unspecified: Secondary | ICD-10-CM | POA: Diagnosis present

## 2019-02-18 DIAGNOSIS — Z833 Family history of diabetes mellitus: Secondary | ICD-10-CM

## 2019-02-18 DIAGNOSIS — Z79899 Other long term (current) drug therapy: Secondary | ICD-10-CM

## 2019-02-18 DIAGNOSIS — N179 Acute kidney failure, unspecified: Secondary | ICD-10-CM | POA: Diagnosis present

## 2019-02-18 DIAGNOSIS — L89319 Pressure ulcer of right buttock, unspecified stage: Secondary | ICD-10-CM | POA: Clinically undetermined

## 2019-02-18 DIAGNOSIS — E860 Dehydration: Secondary | ICD-10-CM | POA: Diagnosis present

## 2019-02-18 DIAGNOSIS — L89329 Pressure ulcer of left buttock, unspecified stage: Secondary | ICD-10-CM | POA: Clinically undetermined

## 2019-02-18 DIAGNOSIS — J181 Lobar pneumonia, unspecified organism: Secondary | ICD-10-CM

## 2019-02-18 DIAGNOSIS — H4010X0 Unspecified open-angle glaucoma, stage unspecified: Secondary | ICD-10-CM | POA: Diagnosis present

## 2019-02-18 DIAGNOSIS — Z7982 Long term (current) use of aspirin: Secondary | ICD-10-CM

## 2019-02-18 DIAGNOSIS — Z8249 Family history of ischemic heart disease and other diseases of the circulatory system: Secondary | ICD-10-CM

## 2019-02-18 DIAGNOSIS — Z888 Allergy status to other drugs, medicaments and biological substances status: Secondary | ICD-10-CM

## 2019-02-18 DIAGNOSIS — D631 Anemia in chronic kidney disease: Secondary | ICD-10-CM | POA: Diagnosis present

## 2019-02-18 DIAGNOSIS — Z8546 Personal history of malignant neoplasm of prostate: Secondary | ICD-10-CM

## 2019-02-18 DIAGNOSIS — I130 Hypertensive heart and chronic kidney disease with heart failure and stage 1 through stage 4 chronic kidney disease, or unspecified chronic kidney disease: Secondary | ICD-10-CM | POA: Diagnosis present

## 2019-02-18 DIAGNOSIS — A419 Sepsis, unspecified organism: Principal | ICD-10-CM | POA: Diagnosis present

## 2019-02-18 DIAGNOSIS — Z20828 Contact with and (suspected) exposure to other viral communicable diseases: Secondary | ICD-10-CM | POA: Diagnosis present

## 2019-02-18 DIAGNOSIS — J189 Pneumonia, unspecified organism: Secondary | ICD-10-CM | POA: Diagnosis present

## 2019-02-18 DIAGNOSIS — E1169 Type 2 diabetes mellitus with other specified complication: Secondary | ICD-10-CM | POA: Diagnosis present

## 2019-02-18 DIAGNOSIS — E162 Hypoglycemia, unspecified: Secondary | ICD-10-CM | POA: Diagnosis present

## 2019-02-18 DIAGNOSIS — Z885 Allergy status to narcotic agent status: Secondary | ICD-10-CM

## 2019-02-18 LAB — COMPREHENSIVE METABOLIC PANEL
ALT: 23 U/L (ref 0–44)
AST: 22 U/L (ref 15–41)
Albumin: 2.6 g/dL — ABNORMAL LOW (ref 3.5–5.0)
Alkaline Phosphatase: 63 U/L (ref 38–126)
Anion gap: 12 (ref 5–15)
BUN: 109 mg/dL — ABNORMAL HIGH (ref 8–23)
CO2: 23 mmol/L (ref 22–32)
Calcium: 9.6 mg/dL (ref 8.9–10.3)
Chloride: 128 mmol/L — ABNORMAL HIGH (ref 98–111)
Creatinine, Ser: 4.08 mg/dL — ABNORMAL HIGH (ref 0.61–1.24)
GFR calc Af Amer: 14 mL/min — ABNORMAL LOW (ref >60)
GFR calc non Af Amer: 12 mL/min — ABNORMAL LOW (ref >60)
Glucose, Bld: 139 mg/dL — ABNORMAL HIGH (ref 70–99)
Potassium: 3.9 mmol/L (ref 3.5–5.1)
Sodium: 163 mmol/L (ref 135–145)
Total Bilirubin: 0.3 mg/dL (ref 0.3–1.2)
Total Protein: 7.4 g/dL (ref 6.5–8.1)

## 2019-02-18 LAB — BASIC METABOLIC PANEL
Anion gap: 11 (ref 5–15)
BUN: 107 mg/dL — ABNORMAL HIGH (ref 8–23)
BUN: 103 mg/dL — ABNORMAL HIGH (ref 8–23)
BUN: 97 mg/dL — ABNORMAL HIGH (ref 8–23)
CO2: 21 mmol/L — ABNORMAL LOW (ref 22–32)
CO2: 21 mmol/L — ABNORMAL LOW (ref 22–32)
CO2: 20 mmol/L — ABNORMAL LOW (ref 22–32)
Calcium: 9.1 mg/dL (ref 8.9–10.3)
Calcium: 9 mg/dL (ref 8.9–10.3)
Calcium: 9.1 mg/dL (ref 8.9–10.3)
Chloride: >130 mmol/L (ref 98–111)
Chloride: >130 mmol/L (ref 98–111)
Chloride: 131 mmol/L (ref 98–111)
Creatinine, Ser: 3.88 mg/dL — ABNORMAL HIGH (ref 0.61–1.24)
Creatinine, Ser: 3.99 mg/dL — ABNORMAL HIGH (ref 0.61–1.24)
Creatinine, Ser: 4.08 mg/dL — ABNORMAL HIGH (ref 0.61–1.24)
GFR calc Af Amer: 15 mL/min — ABNORMAL LOW (ref >60)
GFR calc Af Amer: 14 mL/min — ABNORMAL LOW (ref >60)
GFR calc Af Amer: 14 mL/min — ABNORMAL LOW (ref >60)
GFR calc non Af Amer: 13 mL/min — ABNORMAL LOW (ref >60)
GFR calc non Af Amer: 12 mL/min — ABNORMAL LOW (ref >60)
GFR calc non Af Amer: 12 mL/min — ABNORMAL LOW (ref >60)
Glucose, Bld: 100 mg/dL — ABNORMAL HIGH (ref 70–99)
Glucose, Bld: 133 mg/dL — ABNORMAL HIGH (ref 70–99)
Glucose, Bld: 157 mg/dL — ABNORMAL HIGH (ref 70–99)
Potassium: 4 mmol/L (ref 3.5–5.1)
Potassium: 4.4 mmol/L (ref 3.5–5.1)
Potassium: 4.2 mmol/L (ref 3.5–5.1)
Sodium: 163 mmol/L (ref 135–145)
Sodium: 163 mmol/L (ref 135–145)
Sodium: 162 mmol/L (ref 135–145)

## 2019-02-18 LAB — CBG MONITORING, ED
Glucose-Capillary: 150 mg/dL — ABNORMAL HIGH (ref 70–99)
Glucose-Capillary: 80 mg/dL (ref 70–99)

## 2019-02-18 LAB — URINALYSIS, ROUTINE W REFLEX MICROSCOPIC
Bilirubin Urine: NEGATIVE
Glucose, UA: NEGATIVE mg/dL
Hgb urine dipstick: NEGATIVE
Ketones, ur: NEGATIVE mg/dL
Nitrite: NEGATIVE
Protein, ur: 30 mg/dL — AB
Specific Gravity, Urine: 1.016 (ref 1.005–1.030)
pH: 5 (ref 5.0–8.0)

## 2019-02-18 LAB — CBC WITH DIFFERENTIAL/PLATELET
Abs Immature Granulocytes: 0.15 10*3/uL — ABNORMAL HIGH (ref 0.00–0.07)
Basophils Absolute: 0.1 10*3/uL (ref 0.0–0.1)
Basophils Relative: 1 %
Eosinophils Absolute: 0.2 10*3/uL (ref 0.0–0.5)
Eosinophils Relative: 1 %
HCT: 37.2 % — ABNORMAL LOW (ref 39.0–52.0)
Hemoglobin: 10.8 g/dL — ABNORMAL LOW (ref 13.0–17.0)
Immature Granulocytes: 1 %
Lymphocytes Relative: 15 %
Lymphs Abs: 1.9 10*3/uL (ref 0.7–4.0)
MCH: 28.5 pg (ref 26.0–34.0)
MCHC: 29 g/dL — ABNORMAL LOW (ref 30.0–36.0)
MCV: 98.2 fL (ref 80.0–100.0)
Monocytes Absolute: 0.7 10*3/uL (ref 0.1–1.0)
Monocytes Relative: 6 %
Neutro Abs: 10.1 10*3/uL — ABNORMAL HIGH (ref 1.7–7.7)
Neutrophils Relative %: 76 %
Platelets: 377 10*3/uL (ref 150–400)
RBC: 3.79 MIL/uL — ABNORMAL LOW (ref 4.22–5.81)
RDW: 14.7 % (ref 11.5–15.5)
WBC: 13.1 10*3/uL — ABNORMAL HIGH (ref 4.0–10.5)
nRBC: 0.8 % — ABNORMAL HIGH (ref 0.0–0.2)

## 2019-02-18 LAB — GLUCOSE, CAPILLARY
Glucose-Capillary: 88 mg/dL (ref 70–99)
Glucose-Capillary: 107 mg/dL — ABNORMAL HIGH (ref 70–99)
Glucose-Capillary: 135 mg/dL — ABNORMAL HIGH (ref 70–99)
Glucose-Capillary: 177 mg/dL — ABNORMAL HIGH (ref 70–99)

## 2019-02-18 LAB — TROPONIN I (HIGH SENSITIVITY)
Troponin I (High Sensitivity): 31 ng/L — ABNORMAL HIGH (ref <18)
Troponin I (High Sensitivity): 27 ng/L — ABNORMAL HIGH (ref <18)

## 2019-02-18 LAB — MRSA PCR SCREENING: MRSA by PCR: NEGATIVE

## 2019-02-18 LAB — LACTIC ACID, PLASMA: Lactic Acid, Venous: 0.9 mmol/L (ref 0.5–1.9)

## 2019-02-18 LAB — SARS CORONAVIRUS 2 BY RT PCR (HOSPITAL ORDER, PERFORMED IN ~~LOC~~ HOSPITAL LAB): SARS Coronavirus 2: NEGATIVE

## 2019-02-18 MED ORDER — SODIUM CHLORIDE 0.9 % IV SOLN
2.0000 g | Freq: Once | INTRAVENOUS | Status: AC
  Administered 2019-02-18: 07:00:00 2 g via INTRAVENOUS
  Filled 2019-02-18: qty 2

## 2019-02-18 MED ORDER — POLYETHYL GLYCOL-PROPYL GLYCOL 0.4-0.3 % OP GEL: Freq: Three times a day (TID) | OPHTHALMIC | Status: DC | PRN

## 2019-02-18 MED ORDER — INSULIN ASPART 100 UNIT/ML ~~LOC~~ SOLN
0.0000 [IU] | Freq: Three times a day (TID) | SUBCUTANEOUS | Status: DC
  Administered 2019-02-18: 16:00:00 1 [IU] via SUBCUTANEOUS

## 2019-02-18 MED ORDER — SODIUM CHLORIDE 0.9 % IV SOLN
2.0000 g | INTRAVENOUS | Status: DC
  Administered 2019-02-19: 2 g via INTRAVENOUS
  Filled 2019-02-18: qty 2

## 2019-02-18 MED ORDER — SODIUM CHLORIDE 0.9 % IV BOLUS
1000.0000 mL | Freq: Once | INTRAVENOUS | Status: AC
  Administered 2019-02-18: 06:00:00 1000 mL via INTRAVENOUS

## 2019-02-18 MED ORDER — ONDANSETRON HCL 4 MG/2ML IJ SOLN: 4.0000 mg | Freq: Four times a day (QID) | INTRAMUSCULAR | Status: DC | PRN

## 2019-02-18 MED ORDER — DEXTROSE 5 % IV SOLN
INTRAVENOUS | Status: DC
  Administered 2019-02-18: 16:00:00 via INTRAVENOUS

## 2019-02-18 MED ORDER — ACETAMINOPHEN 325 MG PO TABS: 650.0000 mg | ORAL_TABLET | ORAL | Status: DC | PRN

## 2019-02-18 MED ORDER — DEXTROSE-NACL 5-0.9 % IV SOLN
INTRAVENOUS | Status: DC
  Administered 2019-02-18: 10:00:00 via INTRAVENOUS
  Filled 2019-02-18 (×2): qty 1000

## 2019-02-18 MED ORDER — ASPIRIN EC 81 MG PO TBEC: 81.0000 mg | DELAYED_RELEASE_TABLET | Freq: Every day | ORAL | Status: DC

## 2019-02-18 MED ORDER — VANCOMYCIN VARIABLE DOSE PER UNSTABLE RENAL FUNCTION (PHARMACIST DOSING): Status: DC

## 2019-02-18 MED ORDER — MIRABEGRON ER 25 MG PO TB24
50.0000 mg | ORAL_TABLET | Freq: Every day | ORAL | Status: DC
  Filled 2019-02-18 (×2): qty 2

## 2019-02-18 MED ORDER — ENOXAPARIN SODIUM 30 MG/0.3ML ~~LOC~~ SOLN
30.0000 mg | SUBCUTANEOUS | Status: DC
  Administered 2019-02-18: 30 mg via SUBCUTANEOUS
  Filled 2019-02-18: qty 0.3

## 2019-02-18 MED ORDER — POLYVINYL ALCOHOL 1.4 % OP SOLN
1.0000 [drp] | OPHTHALMIC | Status: DC | PRN
  Administered 2019-02-18: 21:00:00 1 [drp] via OPHTHALMIC
  Filled 2019-02-18: qty 15

## 2019-02-18 MED ORDER — VANCOMYCIN HCL 10 G IV SOLR
2000.0000 mg | Freq: Once | INTRAVENOUS | Status: AC
  Administered 2019-02-18: 2000 mg via INTRAVENOUS
  Filled 2019-02-18: qty 2000

## 2019-02-18 MED ORDER — CHLORHEXIDINE GLUCONATE CLOTH 2 % EX PADS
6.0000 | MEDICATED_PAD | Freq: Every day | CUTANEOUS | Status: DC
  Administered 2019-02-18: 12:00:00 6 via TOPICAL

## 2019-02-18 MED ORDER — LATANOPROST 0.005 % OP SOLN
1.0000 [drp] | Freq: Every day | OPHTHALMIC | Status: DC
  Administered 2019-02-18 – 2019-02-19 (×2): 1 [drp] via OPHTHALMIC
  Filled 2019-02-18: qty 2.5

## 2019-02-18 NOTE — Progress Notes (Signed)
Pharmacy Antibiotic Note  Gregory Hatfield is a 85 y.o. male presented to the ED from nursing home facility on 02/18/2019 with hypoglycemia. CXR on 8/31 showed "Increased right upper lobe airspace disease, suspicious for pneumonia or aspiration."  To start vancomycin and cefepime for PNA.  - afeb, wbc 13.1, scr 4.08 (crcl~14) --> scr was ~2.5 mid August - vancomycin 2000 mg and cefepime 2gm IV x1 given in the ED at ~0700 on 8/31  Plan: - cefepime 2gm IV q24h - with renal insuff., will hold off on entering maintenance dose for vancomycin. Will plan on checking level in a couple of days and re-dose if level is < 20 - f/u renal function ____________________________________________   Temp (24hrs), Avg:94.5 F (34.7 C), Min:94.2 F (34.6 C), Max:95 F (35 C)  Recent Labs  Lab 02/18/19 0417  WBC 13.1*  CREATININE 4.08*  LATICACIDVEN 0.9    Estimated Creatinine Clearance: 14.6 mL/min (A) (by C-G formula based on SCr of 4.08 mg/dL (H)).    Allergies  Allergen Reactions  . Ace Inhibitors Other (See Comments) and Cough    Chronic cough  . Demerol Other (See Comments)    Hard to wake up  . Cosopt [Dorzolamide Hcl-Timolol Mal] Other (See Comments)    Cause heart rate to drop  . Flomax [Tamsulosin Hcl] Other (See Comments)    Low BP  . Morphine And Related Other (See Comments)    hallucinations  . Oxytrol [Oxybutynin] Rash    Thank you for allowing pharmacy to be a part of this patient's care.  Lynelle Doctor 02/18/2019 9:11 AM

## 2019-02-18 NOTE — ED Provider Notes (Addendum)
[facility] DEPT Provider Note   CSN: [id number] Arrival date & time: 02/18/19  0335     History   Chief Complaint Chief Complaint  Patient presents with  . Hypoglycemia    HPI Gregory Hatfield is a 85 y.o. male.     Patient brought to the emergency department by ambulance from nursing home.  Patient found to be hypoglycemic tonight, blood sugar 30.  He was given glucagon.  EMS report that the patient had a blood sugar of 87 upon their arrival.  Information provided by EMS.  They report that nursing home staff were unable to give them any valuable information. Level V Caveat due to dementia.     Past Medical History:  Diagnosis Date  . Bradycardia 10/06/2016  . CHF (congestive heart failure) ([place])   . CKD stage 4 due to type 2 diabetes mellitus ([place]) 06/03/2015  . Dyslipidemia associated with type 2 diabetes mellitus ([place]) 05/05/2015  . Essential hypertension 05/05/2015  . IDDM (insulin dependent diabetes mellitus) ([place])   . SDAT (senile dementia of Alzheimer's type) 06/16/2013  . Uncontrolled insulin-dependent diabetes mellitus with renal manifestation ([place]) 05/05/2015    Patient Active Problem List   Diagnosis Date Noted  . Acute respiratory failure with hypoxia ([place])   . CAP (community acquired pneumonia) 02/02/2019  . Hypokalemia 04/20/2018  . Syncope 04/19/2018  . Acute lower UTI 02/09/2018  . Acute encephalopathy 02/09/2018  . UTI (urinary tract infection) 02/09/2018  . Metabolic encephalopathy 22/29/7989  . Pressure injury of skin 02/09/2018  . Peripheral edema   . Chronic diastolic CHF (congestive heart failure) ([place]) 01/16/2018  . Symptomatic bradycardia 10/06/2016  . Diabetes mellitus with complication ([place]) 21/19/4174  . Acute on chronic diastolic heart failure ([place]) 10/06/2016  . Hyperlipidemia 01/12/2016  . Prostate cancer ([place]) 10/12/2015  . Diabetic retinopathy ([place]) 10/12/2015  . Goals of care,  counseling/discussion 10/12/2015  . Open-angle glaucoma 07/06/2015  . CKD stage 4 due to type 2 diabetes mellitus ([place]) 06/03/2015  . Vitamin D deficiency 06/03/2015  . Medication management 06/03/2015  . Insulin-requiring or dependent type II diabetes mellitus ([place]) 05/05/2015  . Essential hypertension 05/05/2015  . SDAT (senile dementia of Alzheimer's type) ([place]) 06/16/2013    Past Surgical History:  Procedure Laterality Date  . CARDIAC CATHETERIZATION  04/2004   normal r-sided pressure, mild pulm HTN  . CARPAL TUNNEL RELEASE Right   . CERVICAL SPINE SURGERY  1990's  . LUMBAR SPINE SURGERY  2003  . PROSTATE SURGERY  10/2010   transurethral resection   . TOTAL HIP ARTHROPLASTY Left 01/17/2014   Procedure: LEFT TOTAL HIP ARTHROPLASTY;  Surgeon: Yvette Rack., MD;  Location: [facility];  Service: Orthopedics;  Laterality: Left;  . TRANSTHORACIC ECHOCARDIOGRAM  05/2006   EF 60-70%; mild calcif of MV; mild MV regurg; LA mildly dilated  . TRANSURETHRAL RESECTION OF PROSTATE N/A 08/22/2014   Procedure: TRANSURETHRAL RESECTION OF THE PROSTATE (TURP);  Surgeon: Marcia Brash, MD;  Location: [facility];  Service: Urology;  Laterality: N/A;        Home Medications    Prior to Admission medications   Medication Sig Start Date End Date Taking? Authorizing Provider  acetaminophen (TYLENOL) 650 MG CR tablet Take 650 mg by mouth every 8 (eight) hours as needed for pain.   Yes [provider]  amLODipine (NORVASC) 10 MG tablet Take 1 tablet (10 mg total) by mouth daily. 01/21/18  Yes Shelly Coss, MD  aspirin 81 MG tablet  Take 81 mg by mouth daily.   Yes [provider]  B-D UF III MINI PEN NEEDLES 31G X 5 MM MISC USE FOR INJECTIONS AS DIRECTED 12/27/17  Yes Renato Shin, MD  carvedilol (COREG) 6.25 MG tablet TAKE 1 TABLET BY MOUTH TWICE DAILY WITH A MEAL. Patient taking differently: Take 6.25 mg by mouth 2 (two) times daily with a meal.  01/20/18  Yes [name], MD   Cholecalciferol (VITAMIN D3) 125 MCG (5000 UT) CAPS Take 5,000 Units by mouth See admin instructions. Take 1 tablet (5,000 units totally) by mouth on even days   Yes [provider]  Cholecalciferol (VITAMIN D3) 250 MCG (10000 UT) capsule Take 10,000 Units by mouth See admin instructions. Take 1 tablet (10,000 units totally) by mouth on odd days   Yes [provider]  cloNIDine (CATAPRES - DOSED IN MG/24 HR) 0.2 mg/24hr patch Place 1 patch (0.2 mg total) onto the skin once a week. Patient taking differently: Place 0.2 mg onto the skin every Friday.  02/16/18  Yes Georgette Shell, MD  diclofenac sodium (VOLTAREN) 1 % GEL Apply 4 g topically 4 (four) times daily.  04/09/18  Yes [provider]  furosemide (LASIX) 20 MG tablet Take 20 mg by mouth 2 (two) times daily.    Yes [provider]  HUMULIN 70/30 (70-30) 100 UNIT/ML injection Inject 52 Units into the skin daily with breakfast AND 12 Units daily with supper. 44 units with breakfast, and 10 units with supper.. Patient taking differently: Subcutaneous  52 units in the morning at 7 AM; Subcutaneous 12 units in the evening at 17:00 01/21/19  Yes Shamleffer, Melanie Crazier, MD  hydrALAZINE (APRESOLINE) 50 MG tablet Take 1 tablet (50 mg total) by mouth every 8 (eight) hours. 04/21/18  Yes Lady Deutscher, MD  insulin aspart (NOVOLOG FLEXPEN) 100 UNIT/ML FlexPen Inject 0-10 Units into the skin 3 (three) times daily with meals. Per sliding scale 0-150=0 151-200=2 201-250=4  251-300=6 301-350=8 351-400=10 401-999= notify MD   Yes [provider]  latanoprost (XALATAN) 0.005 % ophthalmic solution Place 1 drop into the right eye at bedtime.  04/02/18  Yes [provider]  loperamide (IMODIUM A-D) 2 MG tablet Take 2 mg by mouth every 6 (six) hours as needed for diarrhea or loose stools.   Yes [provider]  loratadine (CLARITIN) 10 MG tablet Take 10 mg by mouth daily.   Yes [provider]  Multiple Vitamin (MULTIVITAMIN) tablet Take 1 tablet by mouth daily.     Yes [provider]  MYRBETRIQ 50 MG TB24 tablet Take 50 mg by mouth daily.  12/10/14  Yes [provider]  ONE TOUCH ULTRA TEST test strip CHECK BLOOD SUGAR 6 TIMES A DAY. 02/22/18  Yes Renato Shin, MD  Polyethyl Glycol-Propyl Glycol (SYSTANE OP) Place 1 drop into both eyes every 8 (eight) hours as needed (dry eyes).   Yes [provider]  potassium chloride SA (K-DUR,KLOR-CON) 20 MEQ tablet Take 1 tablet (20 mEq total) by mouth daily. 04/22/18  Yes Lady Deutscher, MD  Probiotic Product (PROBIOTIC PO) Take 1 capsule by mouth daily.   Yes [provider]  SIMBRINZA 1-0.2 % SUSP Place 1 drop into both eyes 3 (three) times daily. 02/28/13  Yes [provider]  simethicone (MYLICON) 80 MG chewable tablet Chew 80 mg by mouth every 6 (six) hours as needed for flatulence.   Yes [provider]    Family History Family  History  Problem Relation Age of Onset  . Diabetes Mother   . Hypertension Mother   . Prostate cancer Father   . Heart disease Father   . Diabetes Brother   . Cancer Brother   . Heart disease Brother   . Diabetes Sister   . Heart disease Sister   . Hyperlipidemia Child   . Hypertension Child   . Diabetes Child   . Heart attack Child   . Cancer Child     Social History Social History   Tobacco Use  . Smoking status: Former Smoker    Quit date: 06/21/1983    Years since quitting: 35.6  . Smokeless tobacco: Never Used  Substance Use Topics  . Alcohol use: No  . Drug use: No     Allergies   Ace inhibitors, Demerol, Cosopt [dorzolamide hcl-timolol mal], Flomax [tamsulosin hcl], Morphine and related, and Oxytrol [oxybutynin]   Review of Systems Review of Systems  Unable to perform ROS: Dementia     Physical Exam Updated Vital Signs BP (!) 120/52   Pulse (!) 48   Temp (!) 94.3 F (34.6 C) (Rectal)   Resp 16   SpO2  95%   Physical Exam Vitals signs and nursing note reviewed.  Constitutional:      General: He is not in acute distress.    Appearance: Normal appearance. He is well-developed.  HENT:     Head: Normocephalic and atraumatic.     Right Ear: Hearing normal.     Left Ear: Hearing normal.     Nose: Nose normal.  Eyes:     Conjunctiva/sclera: Conjunctivae normal.     Pupils: Pupils are equal, round, and reactive to light.  Neck:     Musculoskeletal: Normal range of motion and neck supple.  Cardiovascular:     Rate and Rhythm: Regular rhythm.     Heart sounds: S1 normal and S2 normal. No murmur. No friction rub. No gallop.   Pulmonary:     Effort: Pulmonary effort is normal. No respiratory distress.     Breath sounds: Normal breath sounds.  Chest:     Chest wall: No tenderness.  Abdominal:     General: Bowel sounds are normal.     Palpations: Abdomen is soft.     Tenderness: There is no abdominal tenderness. There is no guarding or rebound. Negative signs include Murphy's sign and McBurney's sign.     Hernia: No hernia is present.  Musculoskeletal: Normal range of motion.  Skin:    General: Skin is warm and dry.     Findings: No rash.  Neurological:     Mental Status: He is alert.     Cranial Nerves: No cranial nerve deficit.     Sensory: No sensory deficit.     Coordination: Coordination normal.      ED Treatments / Results  Labs (all labs ordered are listed, but only abnormal results are displayed) Labs Reviewed  CBC WITH DIFFERENTIAL/PLATELET - Abnormal; Notable for the following components:      Result Value   WBC 13.1 (*)    RBC 3.79 (*)    Hemoglobin 10.8 (*)    HCT 37.2 (*)    MCHC 29.0 (*)    nRBC 0.8 (*)    Neutro Abs 10.1 (*)    Abs Immature Granulocytes 0.15 (*)    All other components within normal limits  COMPREHENSIVE METABOLIC PANEL - Abnormal; Notable for the following components:   Sodium 163 (*)  Chloride 128 (*)    Glucose, Bld 139 (*)    BUN  109 (*)    Creatinine, Ser 4.08 (*)    Albumin 2.6 (*)    GFR calc non Af Amer 12 (*)    GFR calc Af Amer 14 (*)    All other components within normal limits  URINALYSIS, ROUTINE W REFLEX MICROSCOPIC - Abnormal; Notable for the following components:   APPearance TURBID (*)    Protein, ur 30 (*)    Leukocytes,Ua TRACE (*)    Bacteria, UA RARE (*)    All other components within normal limits  CBG MONITORING, ED - Abnormal; Notable for the following components:   Glucose-Capillary 150 (*)    All other components within normal limits  TROPONIN I (HIGH SENSITIVITY) - Abnormal; Notable for the following components:   Troponin I (High Sensitivity) 31 (*)    All other components within normal limits  SARS CORONAVIRUS 2 (HOSPITAL ORDER, [facility])  LACTIC ACID, PLASMA  TROPONIN I (HIGH SENSITIVITY)    EKG EKG Interpretation  Date/Time:  Monday February 18 2019 04:10:29 EDT Ventricular Rate:  46 PR Interval:    QRS Duration: 159 QT Interval:  562 QTC Calculation: 492 R Axis:   -46 Text Interpretation:  Sinus bradycardia Left bundle branch block No significant change since last tracing Confirmed by Orpah Greek ([id number]) on 02/18/2019 4:21:23 AM   Radiology Dg Chest Port 1 View  Result Date: 02/18/2019 CLINICAL DATA:  Altered mental status. Hyperglycemia. EXAM: PORTABLE CHEST 1 VIEW COMPARISON:  02/03/2019 FINDINGS: The heart size and mediastinal contours are within normal limits. Increased airspace disease is seen in the right upper lobe, suspicious for pneumonia or aspiration. Left lung is clear. No evidence of pleural effusion. IMPRESSION: Increased right upper lobe airspace disease, suspicious for pneumonia or aspiration. Electronically Signed   By: Marlaine Hind M.D.   On: 02/18/2019 05:57    Procedures Procedures (including critical care time)  Medications Ordered in ED Medications  sodium chloride 0.9 % bolus 1,000 mL (has no administration  in time range)     Initial Impression / Assessment and Plan / ED Course  I have reviewed the triage vital signs and the nursing notes.  Pertinent labs & imaging results that were available during my care of the patient were reviewed by me and considered in my medical decision making (see chart for details).       Patient presents to the emergency department from nursing home for evaluation of low blood sugar.  It is unclear how long he has been hypoglycemic for.  Patient had an initial blood sugar of 30.  Patient hypothermic at arrival, consistent with prolonged hypoglycemia.  Patient's blood sugar improved at arrival to the ER.  He is now awake but confused.  He is being actively warmed.  Blood work reveals profound hypernatremia.  Patient reportedly had a PICC line placed for IV hydration at the nursing home but pulled it out sometime in the last 24 hours.  He does have a significant bump in his creatinine from his baseline as well, consistent with dehydration.  Will initiate IV fluids.  Patient recently treated for pneumonia with course of rocephin and zithromax. Xray shows worsening infiltrate. Will treat with Cefepime and Vanc . Patient will require admission.  CRITICAL CARE Performed by: Orpah Greek   Total critical care time: 35 minutes  Critical care time was exclusive of separately billable procedures and treating other  patients.  Critical care was necessary to treat or prevent imminent or life-threatening deterioration.  Critical care was time spent personally by me on the following activities: development of treatment plan with patient and/or surrogate as well as nursing, discussions with consultants, evaluation of patient's response to treatment, examination of patient, obtaining history from patient or surrogate, ordering and performing treatments and interventions, ordering and review of laboratory studies, ordering and review of radiographic studies, pulse  oximetry and re-evaluation of patient's condition.    Final Clinical Impressions(s) / ED Diagnoses   Final diagnoses:  Hypoglycemia  Hypernatremia  AKI (acute kidney injury) ([place])  Pneumonia of right upper lobe due to infectious organism [facility])    ED Discharge Orders    None       Orpah Greek, MD 02/18/19 0541    Orpah Greek, MD 02/18/19 [phone]

## 2019-02-18 NOTE — ED Notes (Signed)
Spoke to pharmacy to send dextrose 5%-0.9%.

## 2019-02-18 NOTE — ED Notes (Signed)
ED TO INPATIENT HANDOFF REPORT  ED Nurse Name and Phone #: [phone]  S Name/Age/Gender Gregory Hatfield 85 y.o. male Room/Bed: WA17/WA17  Code Status   Code Status: Prior  Home/SNF/Other Skilled nursing facility   Triage Complete: Triage complete  Chief Complaint Hyperglycemia  Triage Note Pt arrived via [facility] after a call for hypoglycemia, per facility cbg was 30 and given glucagon. Upon arrival patient has cbg of 87. Pt pulled picc line out earlier today. EMS states they noticed some irregular breathing and a cough, facility unsure if this a new or existing problem.    Allergies Allergies  Allergen Reactions  . Ace Inhibitors Other (See Comments) and Cough    Chronic cough  . Demerol Other (See Comments)    Hard to wake up  . Cosopt [Dorzolamide Hcl-Timolol Mal] Other (See Comments)    Cause heart rate to drop  . Flomax [Tamsulosin Hcl] Other (See Comments)    Low BP  . Morphine And Related Other (See Comments)    hallucinations  . Oxytrol [Oxybutynin] Rash    Level of Care/Admitting Diagnosis ED Disposition    ED Disposition Condition Comment   Admit  Hospital Area: [facility] [[id number]]  Level of Care: Stepdown [14]  Admit to SDU based on following criteria: Severe physiological/psychological symptoms:  Any diagnosis requiring assessment & intervention at least every 4 hours on an ongoing basis to obtain desired patient outcomes including stability and rehabilitation  Covid Evaluation: Asymptomatic Screening Protocol (No Symptoms)  Diagnosis: Pneumonia [299242]  Admitting Physician: Hosie Poisson [[id number]]  Attending Physician: Hosie Poisson [[id number]]  Estimated length of stay: past midnight tomorrow  Certification:: I certify this patient will need inpatient services for at least 2 midnights  PT Class (Do Not Modify): Inpatient [101]  PT Acc Code (Do Not Modify): Private [1]       B Medical/Surgery History Past Medical History:   Diagnosis Date  . Bradycardia 10/06/2016  . CHF (congestive heart failure) ([place])   . CKD stage 4 due to type 2 diabetes mellitus ([place]) 06/03/2015  . Dyslipidemia associated with type 2 diabetes mellitus ([place]) 05/05/2015  . Essential hypertension 05/05/2015  . IDDM (insulin dependent diabetes mellitus) ([place])   . SDAT (senile dementia of Alzheimer's type) 06/16/2013  . Uncontrolled insulin-dependent diabetes mellitus with renal manifestation ([place]) 05/05/2015   Past Surgical History:  Procedure Laterality Date  . CARDIAC CATHETERIZATION  04/2004   normal r-sided pressure, mild pulm HTN  . CARPAL TUNNEL RELEASE Right   . CERVICAL SPINE SURGERY  1990's  . LUMBAR SPINE SURGERY  2003  . PROSTATE SURGERY  10/2010   transurethral resection   . TOTAL HIP ARTHROPLASTY Left 01/17/2014   Procedure: LEFT TOTAL HIP ARTHROPLASTY;  Surgeon: Yvette Rack., MD;  Location: [place];  Service: Orthopedics;  Laterality: Left;  . TRANSTHORACIC ECHOCARDIOGRAM  05/2006   EF 60-70%; mild calcif of MV; mild MV regurg; LA mildly dilated  . TRANSURETHRAL RESECTION OF PROSTATE N/A 08/22/2014   Procedure: TRANSURETHRAL RESECTION OF THE PROSTATE (TURP);  Surgeon: Marcia Brash, MD;  Location: [facility];  Service: Urology;  Laterality: N/A;     A IV Location/Drains/Wounds Patient Lines/Drains/Airways Status   Active Line/Drains/Airways    Name:   Placement date:   Placement time:   Site:   Days:   Peripheral IV 02/18/19 Right Antecubital   02/18/19    0425    Antecubital   less than 1   External Urinary Catheter  02/18/19    0848    -   less than 1   Pressure Injury 02/09/18 Stage II -  Partial thickness loss of dermis presenting as a shallow open ulcer with a red, pink wound bed without slough. open wound with red wound bed, no visible drainage or slough.    02/09/18    0930     374   Pressure Injury 02/09/18 Stage II -  Partial thickness loss of dermis presenting as a shallow open ulcer with a red, pink wound bed  without slough. wound with red wound bed; no drainage.   02/09/18    0930     374   Pressure Injury 04/19/18 Stage II -  Partial thickness loss of dermis presenting as a shallow open ulcer with a red, pink wound bed without slough. Healing pressure ulcer   04/19/18    2240     305   Wound / Incision (Open or Dehisced) 01/16/18 Venous stasis ulcer Leg Left;Anterior;Medial;Lateral;Lower;Posterior 5.1cm x 3.6 cm and other multiple red open wound to LLE    01/16/18    1817    Leg   398          Intake/Output Last 24 hours  Intake/Output Summary (Last 24 hours) at 02/18/2019 1009 Last data filed at 02/18/2019 0957 Gross per 24 hour  Intake 1683.47 ml  Output -  Net 1683.47 ml    Labs/Imaging Results for orders placed or performed during the hospital encounter of 02/18/19 (from the past 48 hour(s))  CBG monitoring, ED     Status: Abnormal   Collection Time: 02/18/19  3:58 AM  Result Value Ref Range   Glucose-Capillary 150 (H) 70 - 99 mg/dL  CBC with Differential/Platelet     Status: Abnormal   Collection Time: 02/18/19  4:17 AM  Result Value Ref Range   WBC 13.1 (H) 4.0 - 10.5 K/uL   RBC 3.79 (L) 4.22 - 5.81 MIL/uL   Hemoglobin 10.8 (L) 13.0 - 17.0 g/dL   HCT 37.2 (L) 39.0 - 52.0 %   MCV 98.2 80.0 - 100.0 fL   MCH 28.5 26.0 - 34.0 pg   MCHC 29.0 (L) 30.0 - 36.0 g/dL   RDW 14.7 11.5 - 15.5 %   Platelets 377 150 - 400 K/uL   nRBC 0.8 (H) 0.0 - 0.2 %   Neutrophils Relative % 76 %   Neutro Abs 10.1 (H) 1.7 - 7.7 K/uL   Lymphocytes Relative 15 %   Lymphs Abs 1.9 0.7 - 4.0 K/uL   Monocytes Relative 6 %   Monocytes Absolute 0.7 0.1 - 1.0 K/uL   Eosinophils Relative 1 %   Eosinophils Absolute 0.2 0.0 - 0.5 K/uL   Basophils Relative 1 %   Basophils Absolute 0.1 0.0 - 0.1 K/uL   RBC Morphology MORPHOLOGY UNREMARKABLE    Immature Granulocytes 1 %   Abs Immature Granulocytes 0.15 (H) 0.00 - 0.07 K/uL    Comment: Performed at [facility], [place] [address]  [place], [place] [address]  Comprehensive metabolic panel     Status: Abnormal   Collection Time: 02/18/19  4:17 AM  Result Value Ref Range   Sodium 163 (HH) 135 - 145 mmol/L    Comment: CRITICAL RESULT CALLED TO, READ BACK BY AND VERIFIED WITH: H,LACIVITA AT 0530 ON 02/18/19 BY A,MOHAMED    Potassium 3.9 3.5 - 5.1 mmol/L   Chloride 128 (H) 98 - 111 mmol/L   CO2 23 22 - 32  mmol/L   Glucose, Bld 139 (H) 70 - 99 mg/dL   BUN 109 (H) 8 - 23 mg/dL    Comment: RESULTS CONFIRMED BY MANUAL DILUTION   Creatinine, Ser 4.08 (H) 0.61 - 1.24 mg/dL   Calcium 9.6 8.9 - 10.3 mg/dL   Total Protein 7.4 6.5 - 8.1 g/dL   Albumin 2.6 (L) 3.5 - 5.0 g/dL   AST 22 15 - 41 U/L   ALT 23 0 - 44 U/L   Alkaline Phosphatase 63 38 - 126 U/L   Total Bilirubin 0.3 0.3 - 1.2 mg/dL   GFR calc non Af Amer 12 (L) >60 mL/min   GFR calc Af Amer 14 (L) >60 mL/min   Anion gap 12 5 - 15    Comment: Performed at [facility], [place] [address], [place], [place] [address]  Troponin I (High Sensitivity)     Status: Abnormal   Collection Time: 02/18/19  4:17 AM  Result Value Ref Range   Troponin I (High Sensitivity) 31 (H) <18 ng/L    Comment: (NOTE) Elevated high sensitivity troponin I (hsTnI) values and significant  changes across serial measurements may suggest ACS but many other  chronic and acute conditions are known to elevate hsTnI results.  Refer to the Links section for chest pain algorithms and additional  guidance. Performed at [facility], [place] [address], [place], [place] [address]   Lactic acid, plasma     Status: None   Collection Time: 02/18/19  4:17 AM  Result Value Ref Range   Lactic Acid, Venous 0.9 0.5 - 1.9 mmol/L    Comment: Performed at [facility], [place] [address], [place], [place] [address]  Urinalysis, Routine w reflex microscopic     Status: Abnormal   Collection Time: 02/18/19  4:31 AM  Result Value Ref Range   Color, Urine YELLOW YELLOW    APPearance TURBID (A) CLEAR   Specific Gravity, Urine 1.016 1.005 - 1.030   pH 5.0 5.0 - 8.0   Glucose, UA NEGATIVE NEGATIVE mg/dL   Hgb urine dipstick NEGATIVE NEGATIVE   Bilirubin Urine NEGATIVE NEGATIVE   Ketones, ur NEGATIVE NEGATIVE mg/dL   Protein, ur 30 (A) NEGATIVE mg/dL   Nitrite NEGATIVE NEGATIVE   Leukocytes,Ua TRACE (A) NEGATIVE   RBC / HPF 6-10 0 - 5 RBC/hpf   WBC, UA 11-20 0 - 5 WBC/hpf   Bacteria, UA RARE (A) NONE SEEN   Squamous Epithelial / LPF 0-5 0 - 5   Mucus PRESENT    Budding Yeast PRESENT    Hyaline Casts, UA PRESENT     Comment: Performed at [facility], [place] [address] [place], [place] [address]  SARS Coronavirus 2 [facility] order, Performed in [facility] hospital lab) Nasopharyngeal Nasopharyngeal Swab     Status: None   Collection Time: 02/18/19  5:43 AM   Specimen: Nasopharyngeal Swab  Result Value Ref Range   SARS Coronavirus 2 NEGATIVE NEGATIVE    Comment: (NOTE) If result is NEGATIVE SARS-CoV-2 target nucleic acids are NOT DETECTED. The SARS-CoV-2 RNA is generally detectable in upper and lower  respiratory specimens during the acute phase of infection. The lowest  concentration of SARS-CoV-2 viral copies this assay can detect is 250  copies / mL. A negative result does not preclude SARS-CoV-2 infection  and should not be used as the sole basis for treatment or other  patient management decisions.  A negative result may occur with  improper specimen  collection / handling, submission of specimen other  than nasopharyngeal swab, presence of viral mutation(s) within the  areas targeted by this assay, and inadequate number of viral copies  (<250 copies / mL). A negative result must be combined with clinical  observations, patient history, and epidemiological information. If result is POSITIVE SARS-CoV-2 target nucleic acids are DETECTED. The SARS-CoV-2 RNA is generally detectable in upper and lower  respiratory specimens dur ing  the acute phase of infection.  Positive  results are indicative of active infection with SARS-CoV-2.  Clinical  correlation with patient history and other diagnostic information is  necessary to determine patient infection status.  Positive results do  not rule out bacterial infection or co-infection with other viruses. If result is PRESUMPTIVE POSTIVE SARS-CoV-2 nucleic acids MAY BE PRESENT.   A presumptive positive result was obtained on the submitted specimen  and confirmed on repeat testing.  While 2019 novel coronavirus  (SARS-CoV-2) nucleic acids may be present in the submitted sample  additional confirmatory testing may be necessary for epidemiological  and / or clinical management purposes  to differentiate between  SARS-CoV-2 and other Sarbecovirus currently known to infect humans.  If clinically indicated additional testing with an alternate test  methodology [phone]) is advised. The SARS-CoV-2 RNA is generally  detectable in upper and lower respiratory sp ecimens during the acute  phase of infection. The expected result is Negative. Fact Sheet for Patients:  StrictlyIdeas.no Fact Sheet for Healthcare Providers: BankingDealers.co.za This test is not yet approved or cleared by the [place] FDA and has been authorized for detection and/or diagnosis of SARS-CoV-2 by FDA under an Emergency Use Authorization (EUA).  This EUA will remain in effect (meaning this test can be used) for the duration of the COVID-19 declaration under Section 564(b)(1) of the Act, 21 U.S.C. section 360bbb-3(b)(1), unless the authorization is terminated or revoked sooner. Performed at [facility], [place] [address] [place], [place] [address]   Troponin I (High Sensitivity)     Status: Abnormal   Collection Time: 02/18/19  6:20 AM  Result Value Ref Range   Troponin I (High Sensitivity) 27 (H) <18 ng/L    Comment: (NOTE) Elevated  high sensitivity troponin I (hsTnI) values and significant  changes across serial measurements may suggest ACS but many other  chronic and acute conditions are known to elevate hsTnI results.  Refer to the "Links" section for chest pain algorithms and additional  guidance. Performed at [facility], [place] [address] [place], [place] [address]   CBG monitoring, ED     Status: None   Collection Time: 02/18/19  8:38 AM  Result Value Ref Range   Glucose-Capillary 80 70 - 99 mg/dL   Dg Chest Port 1 View  Result Date: 02/18/2019 CLINICAL DATA:  Altered mental status. Hyperglycemia. EXAM: PORTABLE CHEST 1 VIEW COMPARISON:  02/03/2019 FINDINGS: The heart size and mediastinal contours are within normal limits. Increased airspace disease is seen in the right upper lobe, suspicious for pneumonia or aspiration. Left lung is clear. No evidence of pleural effusion. IMPRESSION: Increased right upper lobe airspace disease, suspicious for pneumonia or aspiration. Electronically Signed   By: Marlaine Hind M.D.   On: 02/18/2019 05:57    Pending Labs Unresulted Labs (From admission, onward)    Start     Ordered   02/18/19 0931  MRSA PCR Screening  Once,   STAT     02/18/19 0931   02/18/19 8841  Basic metabolic panel  Now then every 6 hours,   R (with STAT occurrences)     02/18/19 0901   Signed and Held  Creatinine, serum  (enoxaparin (LOVENOX)    CrCl < 30 ml/min)  Weekly,   R    Comments: while on enoxaparin therapy.    Signed and Held           Vitals/Pain Today's Vitals   02/18/19 0900 02/18/19 0918 02/18/19 0930 02/18/19 1000  BP: (!) 133/46  (!) 125/49 (!) 121/56  Pulse: (!) 48  (!) 43 (!) 41  Resp: 18  (!) 23 18  Temp:  (!) 95.7 F (35.4 C)    TempSrc:  Rectal    SpO2: 97%  99% 99%  PainSc:        Isolation Precautions No active isolations  Medications Medications  dextrose 5 %-0.9 % sodium chloride infusion ( Intravenous New Bag/Given 02/18/19 0932)  ceFEPIme  (MAXIPIME) 2 g in sodium chloride 0.9 % 100 mL IVPB (has no administration in time range)  vancomycin variable dose per unstable renal function (pharmacist dosing) (has no administration in time range)  sodium chloride 0.9 % bolus 1,000 mL (0 mLs Intravenous Stopped 02/18/19 0844)  vancomycin (VANCOCIN) 2,000 mg in sodium chloride 0.9 % 500 mL IVPB (0 mg Intravenous Stopped 02/18/19 0957)  ceFEPIme (MAXIPIME) 2 g in sodium chloride 0.9 % 100 mL IVPB (0 g Intravenous Stopped 02/18/19 0714)    Mobility non-ambulatory High fall risk   Focused Assessments Neuro Assessment Handoff:   Cardiac Rhythm: Sinus bradycardia         R Recommendations: See Admitting Provider Note  Report given to:   Additional Notes: BAIR HUGGER PRESENT

## 2019-02-18 NOTE — H&P (Signed)
History and Physical    Gregory Hatfield NWG:[id number] DOB: 1926/01/05 DOA: 02/18/2019  PCP: Patient, No Pcp Per  Patient coming from: SNF. [facility].   I have personally briefly reviewed patient's old medical records in [place]  Chief Complaint: HYPOGLYCEMIA   HPI: Gregory Hatfield is a 85 y.o. male with medical history significant of dementia, type 2 diabetes mellitus, stage IV CKD, hyperlipidemia, chronic diastolic heart failure, bradycardia who was recently admitted for pneumonia and discharged to [facility] health/22/2020 presents today for a blood sugar of 30, U AMS gave the patient glucagon and dextrose and his sugar was 87 upon arrival to ED.  Patient was hypothermic and is confused at baseline and could not provide me any history..  Most of the history was obtained from his daughter over the phone and ED notes. As per the daughter patient has been declining over the last few months and has progressively deteriorated had poor oral intake, poor appetite. She wishes to continue with IV fluids and IV antibiotics for at least 24 hours and if no improvement she would like to transition him to hospice as appropriate.   ED Course: On arrival to ED both arm neck with temp of 94.2, bradycardic with heart rate between 30s to 50s, tachypneic, hypotensive with blood pressure 105/50.  Labs were significant for sodium of 163, BUN of 109, creatinine of 4.08, troponin of 31, lactic acid of 0.9, WBC count of 13.1, hemoglobin of 10.8, B19 screening test is negative, urine analysis shows trace leukocytes and rare bacteria.  Chest x-ray showed increased right upper lobe airspace disease, suspicious for pneumonia or aspiration.  Twelve-lead EKG shows sinus bradycardia with left bundle branch block and unchanged from before.   He was referred to medical service for admission for persistent/worsening pneumonia and acute renal failure and sepsis. he will be admitted to stepdown.  Review of  Systems: cannot be obtained due to dementia.   Past Medical History:  Diagnosis Date  . Bradycardia 10/06/2016  . CHF (congestive heart failure) ([place])   . CKD stage 4 due to type 2 diabetes mellitus ([place]) 06/03/2015  . Dyslipidemia associated with type 2 diabetes mellitus ([place]) 05/05/2015  . Essential hypertension 05/05/2015  . IDDM (insulin dependent diabetes mellitus) ([place])   . SDAT (senile dementia of Alzheimer's type) 06/16/2013  . Uncontrolled insulin-dependent diabetes mellitus with renal manifestation ([place]) 05/05/2015    Past Surgical History:  Procedure Laterality Date  . CARDIAC CATHETERIZATION  04/2004   normal r-sided pressure, mild pulm HTN  . CARPAL TUNNEL RELEASE Right   . CERVICAL SPINE SURGERY  1990's  . LUMBAR SPINE SURGERY  2003  . PROSTATE SURGERY  10/2010   transurethral resection   . TOTAL HIP ARTHROPLASTY Left 01/17/2014   Procedure: LEFT TOTAL HIP ARTHROPLASTY;  Surgeon: Yvette Rack., MD;  Location: [place];  Service: Orthopedics;  Laterality: Left;  . TRANSTHORACIC ECHOCARDIOGRAM  05/2006   EF 60-70%; mild calcif of MV; mild MV regurg; LA mildly dilated  . TRANSURETHRAL RESECTION OF PROSTATE N/A 08/22/2014   Procedure: TRANSURETHRAL RESECTION OF THE PROSTATE (TURP);  Surgeon: Marcia Brash, MD;  Location[facility];  Service: Urology;  Laterality: N/A;  Social history   reports that he quit smoking about 35 years ago. He has never used smokeless tobacco. He reports that he does not drink alcohol or use drugs.  Allergies  Allergen Reactions  . Ace Inhibitors Other (See Comments) and Cough    Chronic cough  .  Demerol Other (See Comments)    Hard to wake up  . Cosopt [Dorzolamide Hcl-Timolol Mal] Other (See Comments)    Cause heart rate to drop  . Flomax [Tamsulosin Hcl] Other (See Comments)    Low BP  . Morphine And Related Other (See Comments)    hallucinations  . Oxytrol [Oxybutynin] Rash    Family History  Problem Relation Age of Onset  .  Diabetes Mother   . Hypertension Mother   . Prostate cancer Father   . Heart disease Father   . Diabetes Brother   . Cancer Brother   . Heart disease Brother   . Diabetes Sister   . Heart disease Sister   . Hyperlipidemia Child   . Hypertension Child   . Diabetes Child   . Heart attack Child   . Cancer Child    Family history cannot be reviewed due to dementia  Prior to Admission medications   Medication Sig Start Date End Date Taking? Authorizing Provider  acetaminophen (TYLENOL) 650 MG CR tablet Take 650 mg by mouth every 8 (eight) hours as needed for pain.   Yes [provider]  amLODipine (NORVASC) 10 MG tablet Take 1 tablet (10 mg total) by mouth daily. 01/21/18  Yes Shelly Coss, MD  aspirin 81 MG tablet Take 81 mg by mouth daily.   Yes [provider]  B-D UF III MINI PEN NEEDLES 31G X 5 MM MISC USE FOR INJECTIONS AS DIRECTED 12/27/17  Yes Renato Shin, MD  carvedilol (COREG) 6.25 MG tablet TAKE 1 TABLET BY MOUTH TWICE DAILY WITH A MEAL. Patient taking differently: Take 6.25 mg by mouth 2 (two) times daily with a meal.  01/20/18  Yes [name], MD  Cholecalciferol (VITAMIN D3) 125 MCG (5000 UT) CAPS Take 5,000 Units by mouth See admin instructions. Take 1 tablet (5,000 units totally) by mouth on even days   Yes [provider]  Cholecalciferol (VITAMIN D3) 250 MCG (10000 UT) capsule Take 10,000 Units by mouth See admin instructions. Take 1 tablet (10,000 units totally) by mouth on odd days   Yes [provider]  cloNIDine (CATAPRES - DOSED IN MG/24 HR) 0.2 mg/24hr patch Place 1 patch (0.2 mg total) onto the skin once a week. Patient taking differently: Place 0.2 mg onto the skin every Friday.  02/16/18  Yes Georgette Shell, MD  diclofenac sodium (VOLTAREN) 1 % GEL Apply 4 g topically 4 (four) times daily.  04/09/18  Yes [provider]  furosemide (LASIX) 20 MG tablet Take 20 mg by mouth 2 (two) times daily.    Yes [provider]  HUMULIN 70/30 (70-30) 100 UNIT/ML injection Inject 52 Units into the skin daily with breakfast AND 12 Units daily with supper. 44 units with breakfast, and 10 units with supper.. Patient taking differently: Subcutaneous  52 units in the morning at 7 AM; Subcutaneous 12 units in the evening at 17:00 01/21/19  Yes Shamleffer, Melanie Crazier, MD  hydrALAZINE (APRESOLINE) 50 MG tablet Take 1 tablet (50 mg total) by mouth every 8 (eight) hours. 04/21/18  Yes Lady Deutscher, MD  insulin aspart (NOVOLOG FLEXPEN) 100 UNIT/ML FlexPen Inject 0-10 Units into the skin 3 (three) times daily with meals. Per sliding scale 0-150=0 151-200=2 201-250=4  251-300=6 301-350=8 351-400=10 401-999= notify MD   Yes [provider]  latanoprost (XALATAN) 0.005 % ophthalmic solution Place 1 drop into the right eye at bedtime.  04/02/18  Yes [provider]  loperamide (IMODIUM  A-D) 2 MG tablet Take 2 mg by mouth every 6 (six) hours as needed for diarrhea or loose stools.   Yes [provider]  loratadine (CLARITIN) 10 MG tablet Take 10 mg by mouth daily.   Yes [provider]  Multiple Vitamin (MULTIVITAMIN) tablet Take 1 tablet by mouth daily.     Yes [provider]  MYRBETRIQ 50 MG TB24 tablet Take 50 mg by mouth daily.  12/10/14  Yes [provider]  ONE TOUCH ULTRA TEST test strip CHECK BLOOD SUGAR 6 TIMES A DAY. 02/22/18  Yes Renato Shin, MD  Polyethyl Glycol-Propyl Glycol (SYSTANE OP) Place 1 drop into both eyes every 8 (eight) hours as needed (dry eyes).   Yes [provider]  potassium chloride SA (K-DUR,KLOR-CON) 20 MEQ tablet Take 1 tablet (20 mEq total) by mouth daily. 04/22/18  Yes Lady Deutscher, MD  Probiotic Product (PROBIOTIC PO) Take 1 capsule by mouth daily.   Yes [provider]  SIMBRINZA 1-0.2 % SUSP Place 1 drop into both eyes 3 (three) times daily. 02/28/13  Yes [provider]  simethicone  (MYLICON) 80 MG chewable tablet Chew 80 mg by mouth every 6 (six) hours as needed for flatulence.   Yes [provider]    Physical Exam:  Constitutional: Mild distress from hypothermia Vitals:   02/18/19 0745 02/18/19 0800 02/18/19 0830 02/18/19 0843  BP:  (!) 134/47 123/84   Pulse:  (!) 45 (!) 46   Resp:  18 18   Temp:    (!) 95 F (35 C)  TempSrc:    Rectal  SpO2: 97% 96% 98%    Eyes: PERRL, lids and conjunctivae normal ENMT: Mucous membranes are dry Neck: normal, supple,  Respiratory: Scattered rhonchi at the bases more on the right side.  Air entry fair tachypnea present Cardiovascular: Bradycardic, no murmurs, S1-S2 heard Abdomen: Soft, nontender, nondistended Musculoskeletal: no clubbing / cyanosis.  Trace leg edema Skin: no rashes, lesions, ulcers. No induration Neurologic: Patient is alert and responding to his name.  Following simple commands, confused.  Gait could not be tested. Psychiatric: Could not be assessed Labs on Admission: I have personally reviewed following labs and imaging studies  CBC: Recent Labs  Lab 02/18/19 0417  WBC 13.1*  NEUTROABS 10.1*  HGB 10.8*  HCT 37.2*  MCV 98.2  PLT 102   Basic Metabolic Panel: Recent Labs  Lab 02/18/19 0417  NA 163*  K 3.9  CL 128*  CO2 23  GLUCOSE 139*  BUN 109*  CREATININE 4.08*  CALCIUM 9.6   GFR: Estimated Creatinine Clearance: 14.6 mL/min (A) (by C-G formula based on SCr of 4.08 mg/dL (H)). Liver Function Tests: Recent Labs  Lab 02/18/19 0417  AST 22  ALT 23  ALKPHOS 63  BILITOT 0.3  PROT 7.4  ALBUMIN 2.6*   No results for input(s): LIPASE, AMYLASE in the last 168 hours. No results for input(s): AMMONIA in the last 168 hours. Coagulation Profile: No results for input(s): INR, PROTIME in the last 168 hours. Cardiac Enzymes: No results for input(s): CKTOTAL, CKMB, CKMBINDEX, TROPONINI in the last 168 hours. BNP (last 3 results) No results for input(s): PROBNP in the last 8760  hours. HbA1C: No results for input(s): HGBA1C in the last 72 hours. CBG: Recent Labs  Lab 02/12/19 0524 02/12/19 0751 02/12/19 1127 02/18/19 0358 02/18/19 0838  GLUCAP 272* 288* 352* 150* 80   Lipid Profile: No results for input(s): CHOL, HDL, LDLCALC, TRIG, CHOLHDL, LDLDIRECT in  the last 72 hours. Thyroid Function Tests: No results for input(s): TSH, T4TOTAL, FREET4, T3FREE, THYROIDAB in the last 72 hours. Anemia Panel: No results for input(s): VITAMINB12, FOLATE, FERRITIN, TIBC, IRON, RETICCTPCT in the last 72 hours. Urine analysis:    Component Value Date/Time   COLORURINE YELLOW 02/18/2019 0431   APPEARANCEUR TURBID (A) 02/18/2019 0431   LABSPEC 1.016 02/18/2019 0431   PHURINE 5.0 02/18/2019 0431   GLUCOSEU NEGATIVE 02/18/2019 0431   HGBUR NEGATIVE 02/18/2019 0431   BILIRUBINUR NEGATIVE 02/18/2019 0431   KETONESUR NEGATIVE 02/18/2019 0431   PROTEINUR 30 (A) 02/18/2019 0431   UROBILINOGEN 0.2 05/05/2015 1023   NITRITE NEGATIVE 02/18/2019 0431   LEUKOCYTESUR TRACE (A) 02/18/2019 0431    Radiological Exams on Admission: Dg Chest Port 1 View  Result Date: 02/18/2019 CLINICAL DATA:  Altered mental status. Hyperglycemia. EXAM: PORTABLE CHEST 1 VIEW COMPARISON:  02/03/2019 FINDINGS: The heart size and mediastinal contours are within normal limits. Increased airspace disease is seen in the right upper lobe, suspicious for pneumonia or aspiration. Left lung is clear. No evidence of pleural effusion. IMPRESSION: Increased right upper lobe airspace disease, suspicious for pneumonia or aspiration. Electronically Signed   By: Marlaine Hind M.D.   On: 02/18/2019 05:57    EKG: Independently reviewed.  Bradycardic with left bundle branch block, unchanged from before  Assessment/Plan Active Problems:   Hypoglycemia   Pneumonia     Hypothermia, hypoglycemia, slightly hypotensive, tachypnea, leukocytosis , AKI, worsening right-sided pneumonia Admitted for sepsis secondary to  right-sided pneumonia Start the patient on broad-spectrum IV antibiotics with IV vancomycin and IV cefepime, hydrate.  Admit the patient to stepdown.  Nasal cannula oxygen to keep sats greater than 90% Lactic acid within normal limits. Upon discussion with his daughter . She requested  to treat the patient with IV fluids and IV antibiotics for 24 hours and if no improvement would like to pursue hospice. Palliative care consulted for goals of care.    AKI stage IV CKD Probably secondary to poor oral intake, poor renal perfusion from dehydration Gently hydrate and repeat renal parameters. US renal ordered.  UA not significant for infection.    Dementia/acute metabolic encephalopathy probably secondary to hyper natremia and AKI and worsening pneumonia.    Type 2 diabetes mellitus Uncontrolled with hypoglycemia probably secondary to sepsis A1c is 8.6 Continue with SSI.   Hypernatremia Probably secondary to free water deficit from poor oral intake and dehydration. Dextrose NS fluids on board.    Anemia of chronic disease;  Monitor.    Dysphagia Get repeat SLP evaluation patient was discharged on dysphagia diet with nectar thick liquids.  Severity of Illness: The appropriate patient status for this patient is INPATIENT. Inpatient status is judged to be reasonable and necessary in order to provide the required intensity of service to ensure the patient's safety. The patient's presenting symptoms, physical exam findings, and initial radiographic and laboratory data in the context of their chronic comorbidities is felt to place them at high risk for further clinical deterioration. Furthermore, it is not anticipated that the patient will be medically stable for discharge from the hospital within 2 midnights of admission. The following factors support the patient status of inpatient.   " The patient's presenting symptoms include hypoglycemia. . " The worrisome physical exam findings  include hypothermia, hypoglycemia, confusion.  " The initial radiographic and laboratory data are worrisome because of AKI, worsening pneumonia.  " The chronic co-morbidities include dementia, stage 4 CKD.   * I  certify that at the point of admission it is my clinical judgment that the patient will require inpatient hospital care spanning beyond 2 midnights from the point of admission due to high intensity of service, high risk for further deterioration and high frequency of surveillance required.*     DVT prophylaxis: Lovenox Code Status: DNR Family Communication: disCussed with daughter over the phone Disposition Plan: pending clinical improvement.  Consults called: palliative care.  Admission status: inpatient, SDU   Hosie Poisson MD [facility] Pager [phone]  If 7PM-7AM, please contact night-coverage [email or web address] Password [email or web address]  02/18/2019, 9:05 AM

## 2019-02-18 NOTE — Evaluation (Signed)
Clinical/Bedside Swallow Evaluation Patient Details  Name: Gregory Hatfield MRN: [id number] Date of Birth: 01/28/1926  Today's Date: 02/18/2019 Time:        Past Medical History:  Past Medical History:  Diagnosis Date  . Bradycardia 10/06/2016  . CHF (congestive heart failure) ([place])   . CKD stage 4 due to type 2 diabetes mellitus ([place]) 06/03/2015  . Dyslipidemia associated with type 2 diabetes mellitus ([place]) 05/05/2015  . Essential hypertension 05/05/2015  . IDDM (insulin dependent diabetes mellitus) ([place])   . SDAT (senile dementia of Alzheimer's type) 06/16/2013  . Uncontrolled insulin-dependent diabetes mellitus with renal manifestation ([place]) 05/05/2015   Past Surgical History:  Past Surgical History:  Procedure Laterality Date  . CARDIAC CATHETERIZATION  04/2004   normal r-sided pressure, mild pulm HTN  . CARPAL TUNNEL RELEASE Right   . CERVICAL SPINE SURGERY  1990's  . LUMBAR SPINE SURGERY  2003  . PROSTATE SURGERY  10/2010   transurethral resection   . TOTAL HIP ARTHROPLASTY Left 01/17/2014   Procedure: LEFT TOTAL HIP ARTHROPLASTY;  Surgeon: Yvette Rack., MD;  Location: [facility];  Service: Orthopedics;  Laterality: Left;  . TRANSTHORACIC ECHOCARDIOGRAM  05/2006   EF 60-70%; mild calcif of MV; mild MV regurg; LA mildly dilated  . TRANSURETHRAL RESECTION OF PROSTATE N/A 08/22/2014   Procedure: TRANSURETHRAL RESECTION OF THE PROSTATE (TURP);  Surgeon: Marcia Brash, MD;  [facility];  Service: Urology;  Laterality: N/A;   HPI:  Gregory Hatfield is a 85 y.o. male with PMH of dementia, DM II, CKD stage 4, CHF, and HTN who presents with fever and shortness of breath with recent hospital stay after fall, pt transferred from [facility] facility to SNF after most recent stay.  He participated in [facility] 8/20 showing aspiration of thin - before and after swallowing.  Pt was at SNF [facility]) since last Tuesday   Assessment / Plan / Recommendation Clinical Impression  Upon  waking pt, he started overtly coughing and his eyes opened widely. SLP set up oral suction and cleared mild retained secretions.  Currently pt presenting with indication of severe oral and known h/o pharyngeal dysphagia that is likely severely exacerbated from prior admission.  He presents with open mouth posture - requiring total cues to seal his lips on oral suction. Xerostomia noted - pt provided with tsps nectar juice from pt's daughter Velva Harman.  Oral holding and anterior spillage without transiting of any bolus.  SLP then attempted hand over hand assist to facilitate motor planning of swallowing.  This was not successful to elicit a swallow either and SLP orally suctioned pt's oral cavity.  Session halted due to pt needing to be cleaned up.  Daughter present reports pt was coughing with liquids -which was improved with use of nectar.  SLP inquired regarding possible palliative meeting to establish [place] given pt's comorbitidies and recurrent hospital admits.  Nursing staff then asked daughter questions regarding premorbid status prior to [place] providing answers to willingness for palliative meeting. SLP Visit Diagnosis: Dysphagia, oropharyngeal phase (R13.12)    Aspiration Risk  Risk for inadequate nutrition/hydration;Severe aspiration risk    Diet Recommendation   Nectar liquids for medications  Medication Administration: (crushed with nectar - oral suction if pt does not swallow) Compensations: Slow rate;Small sips/bites(oral suction if pt does not swallow) Postural Changes: Seated upright at 90 degrees    Other  Recommendations Oral Care Recommendations: Oral care BID Other Recommendations: Order thickener from pharmacy   Follow up Recommendations Skilled  Nursing facility      Frequency and Duration min 2x/week  2 weeks       Prognosis Prognosis for Safe Diet Advancement: Guarded Barriers to Reach Goals: Cognitive deficits;Time post onset      Swallow Study   General Date of Onset:  02/18/19 HPI: Gregory Hatfield is a 85 y.o. male with PMH of dementia, DM II, CKD stage 4, CHF, and HTN who presents with fever and shortness of breath with recent hospital stay after fall, pt transferred from [facility] facility to SNF after most recent stay.  He participated in [facility]20 showing aspiration of thin - before and after swallowing.  Pt was at SNF [facility]) since last Tuesday Previous Swallow Assessment: see HPI Diet Prior to this Study: Dysphagia 3 (soft);Nectar-thick liquids Respiratory Status: Nasal cannula History of Recent Intubation: No Behavior/Cognition: Alert;Cooperative;Confused Oral Cavity Assessment: Within Functional Limits Oral Care Completed by SLP: No Oral Cavity - Dentition: Dentures, top;Dentures, bottom(partials) Vision: Functional for self-feeding Self-Feeding Abilities: Total assist Patient Positioning: Upright in bed Baseline Vocal Quality: Other (comment);Low vocal intensity Volitional Cough: Weak;Congested Volitional Swallow: Unable to elicit(open mouth posture)    Oral/Motor/Sensory Function Overall Oral Motor/Sensory Function: Generalized oral weakness   Ice Chips Ice chips: Not tested   Thin Liquid Thin Liquid: Not tested    Nectar Thick Nectar Thick Liquid: Impaired Presentation: Spoon Oral Phase Impairments: Reduced labial seal;Reduced lingual movement/coordination;Poor awareness of bolus Oral phase functional implications: Oral holding(anterior spillage, pt not eliciting swallow)   Honey Thick Honey Thick Liquid: Not tested   Puree Puree: Not tested   Solid     Solid: Not tested      Macario Golds 02/18/2019,2:52 PM    Luanna Salk, MS [place] Pager [phone] Office [phone]

## 2019-02-18 NOTE — ED Triage Notes (Signed)
Pt arrived via [facility] after a call for hypoglycemia, per facility cbg was 30 and given glucagon. Upon arrival patient has cbg of 87. Pt pulled picc line out earlier today. EMS states they noticed some irregular breathing and a cough, facility unsure if this a new or existing problem.

## 2019-02-18 NOTE — ED Notes (Signed)
X-ray at bedside

## 2019-02-18 NOTE — ED Notes (Signed)
This nurse spoke to family member Velva Harman, if unable to reach her asked to call her sister Janifer Adie at [phone]

## 2019-02-18 NOTE — Evaluation (Addendum)
SLP Cancellation Note  Patient Details Name: Gregory Hatfield MRN: [id number] DOB: May 02, 1926   Cancelled treatment:       Reason Eval/Treat Not Completed: Other (comment);Patient at procedure or test/unavailable(IV team with pt, will continue efforts)   Pt's h/o dysphagia noted with audible aspiration of thin on prior MBS 02/09/19, will continue efforts.   Gregory Hatfield 02/18/2019, 1:43 PM   Gregory Hatfield, [facility] SLP Acute Rehab Services Pager [phone] Office [phone]

## 2019-02-18 NOTE — Progress Notes (Signed)
  Speech Language Pathology Treatment: Dysphagia  Patient Details Name: Gregory Hatfield MRN: [id number] DOB: 03/25/1926 Today's Date: 02/18/2019 Time: [id number] SLP Time Calculation (min) (ACUTE ONLY): 14 min  Assessment / Plan / Recommendation Clinical Impression  Session focused on education of daughter to dysphagia with dementia, functional role of aspiration migitation but not full prevention.  Reviewed verbal report SLP received from [facility] - SLP spoke to nurse Gregory Hatfield who worked with pt last Thursday.  At that time, RN Gregory Hatfield reports pt was on regular/nectar and fed himself with good tolerance of intake. When inquired to how events progressed over the weekend, SLP was informed weekend shift staff was not present and therefore there was not knowledge of weekend events.  Daughter stated she was going to go to [facility] to speak to them regarding pt's care and exchange of information.    Reviewed importance of oral suctioning if pt does not elicit swallow and to only give pt intake if he is fully alert and eliciting a swallow. Anticipate he will not meet nutritional needs - will follow for po tolerance, education.  Daughter verbalized understanding to all information reviewed and given her paramedic background she understands aspiration risk.  .   Of note, pt coughed x3 during session on secretions- with wide opening of his eyes.  Reviewed importance of oral care with daughter.   SLP raised HOB and assessed or oral suction needs.  Pt moans without meaningful expression with waking but does state he "wants to go home" and clearly shakes his head "no" when asked if he desires po intake.    Daughter states the MD has informed her that if the pt does not improve, hospice care will be indicated.  Will provide information regarding comfort feeding as appropriate during next session.  Gregory Hatfield, daughter, clearly verbalized she wants her dad to be comfortable.        HPI HPI: Gregory Hatfield is a 85 y.o. male  with PMH of dementia, DM II, CKD stage 4, CHF, and HTN who presents with fever and shortness of breath with recent hospital stay after fall, pt transferred from [facility] facility to SNF after most recent stay.  He participated in [facility] 8/20 showing aspiration of thin - before and after swallowing.  Pt was at SNF [facility]) since last Tuesday.  Pt CXR concerning for asp pna, right lobe pna.  Swallow eval ordered.      SLP Plan  Continue with current plan of care       Recommendations  Diet recommendations: Nectar-thick liquid(nectar liquids ONLY) Liquids provided via: Teaspoon Medication Administration: Via alternative means(crushed with nectar or puree - oral suction if pt does not swallow) Compensations: Slow rate;Small sips/bites(oral suction if pt does not swallow) Postural Changes and/or Swallow Maneuvers: Seated upright 90 degrees;Upright 30-60 min after meal                Oral Care Recommendations: Oral care QID Follow up Recommendations: Skilled Nursing facility SLP Visit Diagnosis: Dysphagia, oropharyngeal phase (R13.12) Plan: Continue with current plan of care       Gregory Hatfield, Gregory Hatfield SLP [facility] Pager [phone] Office [phone]    Gregory Hatfield 02/18/2019, 3:27 PM

## 2019-02-18 NOTE — ED Notes (Signed)
Date and time results received: 02/18/19 5:29 AM (use smartphrase ".now" to insert current time)  Test: Sodium Critical Value: 163  Name of Provider Notified: Pollina MD  Orders Received? Or Actions Taken?:

## 2019-02-18 NOTE — ED Notes (Signed)
Report given to Mary, RN.

## 2019-02-18 NOTE — Progress Notes (Signed)
A consult was received from an ED physician for Vancomycin, cefepime per pharmacy dosing.  The patient's profile has been reviewed for ht/wt/allergies/indication/available labs.   A one time order has been placed for Vancomycin 2gm iv x1, and Cefepime 2gm iv x1.  Further antibiotics/pharmacy consults should be ordered by admitting physician if indicated.                       Thank you, Nani Skillern Crowford 02/18/2019  6:25 AM

## 2019-02-18 NOTE — ED Notes (Signed)
Bair hugger blanket applied due to rectal temp of 94.3

## 2019-02-18 NOTE — ED Notes (Signed)
Bair hugger blanket continued since 4am.

## 2019-02-18 NOTE — ED Notes (Signed)
I offer patient his breakfast tray and he refused

## 2019-02-18 NOTE — Progress Notes (Signed)
CRITICAL VALUE ALERT  Critical Value:  Na 162, Chloride 131  Date & Time Notied:  2125 02/18/2019  Provider Notified: N/A consistent with previously reported, pt made DNR today.  Orders Received/Actions taken: See above

## 2019-02-19 ENCOUNTER — Inpatient Hospital Stay (HOSPITAL_COMMUNITY): Payer: Medicare Other

## 2019-02-19 LAB — GLUCOSE, CAPILLARY
Glucose-Capillary: 224 mg/dL — ABNORMAL HIGH (ref 70–99)
Glucose-Capillary: 241 mg/dL — ABNORMAL HIGH (ref 70–99)
Glucose-Capillary: 206 mg/dL — ABNORMAL HIGH (ref 70–99)
Glucose-Capillary: 202 mg/dL — ABNORMAL HIGH (ref 70–99)
Glucose-Capillary: 188 mg/dL — ABNORMAL HIGH (ref 70–99)

## 2019-02-19 LAB — CBC
HCT: 37.8 % — ABNORMAL LOW (ref 39.0–52.0)
Hemoglobin: 10.9 g/dL — ABNORMAL LOW (ref 13.0–17.0)
MCH: 28.4 pg (ref 26.0–34.0)
MCHC: 28.8 g/dL — ABNORMAL LOW (ref 30.0–36.0)
MCV: 98.4 fL (ref 80.0–100.0)
Platelets: 288 10*3/uL (ref 150–400)
RBC: 3.84 MIL/uL — ABNORMAL LOW (ref 4.22–5.81)
RDW: 15.1 % (ref 11.5–15.5)
WBC: 10.4 10*3/uL (ref 4.0–10.5)
nRBC: 0.9 % — ABNORMAL HIGH (ref 0.0–0.2)

## 2019-02-19 LAB — COMPREHENSIVE METABOLIC PANEL
ALT: 25 U/L (ref 0–44)
AST: 33 U/L (ref 15–41)
Albumin: 2.5 g/dL — ABNORMAL LOW (ref 3.5–5.0)
Alkaline Phosphatase: 60 U/L (ref 38–126)
Anion gap: 11 (ref 5–15)
BUN: 92 mg/dL — ABNORMAL HIGH (ref 8–23)
CO2: 19 mmol/L — ABNORMAL LOW (ref 22–32)
Calcium: 9.2 mg/dL (ref 8.9–10.3)
Chloride: >130 mmol/L (ref 98–111)
Creatinine, Ser: 4.09 mg/dL — ABNORMAL HIGH (ref 0.61–1.24)
GFR calc Af Amer: 14 mL/min — ABNORMAL LOW (ref >60)
GFR calc non Af Amer: 12 mL/min — ABNORMAL LOW (ref >60)
Glucose, Bld: 249 mg/dL — ABNORMAL HIGH (ref 70–99)
Potassium: 4.7 mmol/L (ref 3.5–5.1)
Sodium: 162 mmol/L (ref 135–145)
Total Bilirubin: 0.3 mg/dL (ref 0.3–1.2)
Total Protein: 7 g/dL (ref 6.5–8.1)

## 2019-02-19 MED ORDER — ONDANSETRON HCL 4 MG/2ML IJ SOLN: 4.0000 mg | Freq: Four times a day (QID) | INTRAMUSCULAR | Status: DC | PRN

## 2019-02-19 MED ORDER — ONDANSETRON 4 MG PO TBDP: 4.0000 mg | ORAL_TABLET | Freq: Four times a day (QID) | ORAL | Status: DC | PRN

## 2019-02-19 MED ORDER — DEXTROSE-NACL 5-0.45 % IV SOLN
INTRAVENOUS | Status: DC
  Administered 2019-02-19: 09:00:00 via INTRAVENOUS

## 2019-02-19 MED ORDER — LORAZEPAM 2 MG/ML PO CONC: 1.0000 mg | ORAL | Status: DC | PRN

## 2019-02-19 MED ORDER — HALOPERIDOL LACTATE 2 MG/ML PO CONC
2.0000 mg | ORAL | Status: DC | PRN
  Filled 2019-02-19: qty 1

## 2019-02-19 MED ORDER — ACETAMINOPHEN 325 MG PO TABS: 650.0000 mg | ORAL_TABLET | Freq: Four times a day (QID) | ORAL | Status: DC | PRN

## 2019-02-19 MED ORDER — LORAZEPAM 2 MG/ML IJ SOLN: 1.0000 mg | INTRAMUSCULAR | Status: DC | PRN

## 2019-02-19 MED ORDER — LORAZEPAM 2 MG/ML IJ SOLN
1.0000 mg | INTRAMUSCULAR | Status: DC | PRN
  Administered 2019-02-19: 1 mg via INTRAVENOUS
  Filled 2019-02-19: qty 1

## 2019-02-19 MED ORDER — HALOPERIDOL LACTATE 5 MG/ML IJ SOLN
2.0000 mg | INTRAMUSCULAR | Status: DC | PRN
  Administered 2019-02-20: 2 mg via INTRAVENOUS
  Filled 2019-02-19: qty 1

## 2019-02-19 MED ORDER — ACETAMINOPHEN 650 MG RE SUPP: 650.0000 mg | Freq: Four times a day (QID) | RECTAL | Status: DC | PRN

## 2019-02-19 MED ORDER — INSULIN ASPART 100 UNIT/ML ~~LOC~~ SOLN
0.0000 [IU] | SUBCUTANEOUS | Status: DC
  Administered 2019-02-19 (×2): 5 [IU] via SUBCUTANEOUS

## 2019-02-19 MED ORDER — HYDROMORPHONE HCL 1 MG/ML IJ SOLN: 0.5000 mg | INTRAMUSCULAR | Status: DC | PRN

## 2019-02-19 MED ORDER — ORAL CARE MOUTH RINSE
15.0000 mL | Freq: Two times a day (BID) | OROMUCOSAL | Status: DC
  Administered 2019-02-19 – 2019-02-21 (×4): 15 mL via OROMUCOSAL

## 2019-02-19 MED ORDER — LORAZEPAM 1 MG PO TABS: 1.0000 mg | ORAL_TABLET | ORAL | Status: DC | PRN

## 2019-02-19 MED ORDER — SODIUM CHLORIDE 0.9 % IV SOLN
3.0000 g | Freq: Two times a day (BID) | INTRAVENOUS | Status: DC
  Filled 2019-02-19: qty 8

## 2019-02-19 MED ORDER — HEPARIN SODIUM (PORCINE) 5000 UNIT/ML IJ SOLN
5000.0000 [IU] | Freq: Three times a day (TID) | INTRAMUSCULAR | Status: DC
  Administered 2019-02-19 (×2): 5000 [IU] via SUBCUTANEOUS
  Filled 2019-02-19 (×2): qty 1

## 2019-02-19 MED ORDER — HALOPERIDOL 2 MG PO TABS
2.0000 mg | ORAL_TABLET | ORAL | Status: DC | PRN
  Filled 2019-02-19: qty 1

## 2019-02-19 NOTE — Progress Notes (Signed)
Pharmacy Antibiotic Note  Gregory Hatfield is a 85 y.o. male presented to the ED from nursing home facility on 02/18/2019 with hypoglycemia. CXR on 8/31 showed "Increased right upper lobe airspace disease, suspicious for pneumonia or aspiration."  Pharmacy to dose vancomycin and cefepime for PNA.  Plan: 9/1 Abx to Unasyn 34gm q12 for Aspiration PNA  Temp (24hrs), Avg:97.5 F (36.4 C), Min:94.9 F (34.9 C), Max:99 F (37.2 C)  Recent Labs  Lab 02/18/19 0417 02/18/19 1122 02/18/19 1508 02/18/19 2043 02/19/19 0231  WBC 13.1*  --   --   --  10.4  CREATININE 4.08* 3.88* 3.99* 4.08* 4.09*  LATICACIDVEN 0.9  --   --   --   --     Estimated Creatinine Clearance: 12.9 mL/min (A) (by C-G formula based on SCr of 4.09 mg/dL (H)).    Allergies  Allergen Reactions  . Ace Inhibitors Other (See Comments) and Cough    Chronic cough  . Demerol Other (See Comments)    Hard to wake up  . Cosopt [Dorzolamide Hcl-Timolol Mal] Other (See Comments)    Cause heart rate to drop  . Flomax [Tamsulosin Hcl] Other (See Comments)    Low BP  . Morphine And Related Other (See Comments)    hallucinations  . Oxytrol [Oxybutynin] Rash   Antimicrobials this admission:  8/31 Vancomycin >> 9/1 8/31 Cefepime  >> 9/1 9/1 Unasyn >>  Dose adjustments this admission:   Microbiology results:  8/31 MRSA PCR: neg 8/31 & 8/21 Covid: neg  8/15 urine & BCx: no growth  Thank you for allowing pharmacy to be a part of this patient's care.  Minda Ditto 02/19/2019 8:47 AM

## 2019-02-19 NOTE — Progress Notes (Signed)
Chaplain following due to spiritual care consul re: palliative / end of life.    Provided support with Gregory Hatfield and daughter, Gregory Hatfield, at bedside.  Engaged in life review, support around end of life decisions and care.    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  Gregory Hatfield expresses readiness for [facility] transition and hopes to care for him as best they can through the process.  She is a substance abuse counselor and former EMT.    Speaks with chaplain about energy it takes for her to facilitate family visitation / passing of information.  Chaplains will continue to follow for family support while at [facility].   Jerene Pitch, MDiv  [facility]

## 2019-02-19 NOTE — Progress Notes (Addendum)
[facility] Progress Note  Patient: Gregory Hatfield WHQ:[id number]   PCP: Patient, No Pcp Per DOB: Sep 29, 1925   DOA: 02/18/2019   DOS: 02/19/2019   Date of Service: the patient was seen and examined on 02/19/2019  Brief hospital course: Pt. with PMH of dementia, type 2 diabetes mellitus, stage IV CKD, hyperlipidemia, chronic diastolic heart failure, bradycardia; admitted on 02/18/2019, presented with complaint of confusion, was found to have . Currently further plan is continue comfort care.  Subjective: Patient reports that he is not feeling well.  Unable to specify what is the problem.  No nausea no vomiting.  Reports short of breath.  Has coughing.  Assessment and Plan: 1.  Aspiration pneumonia. Dysphagia. Dementia. Acute metabolic encephalopathy. Hypothermia, hypoglycemia, hypotension, tachypnea, leukocytosis. Sepsis POA. Acute kidney injury. Patient presents with hypothermia found to have hyponatremia. Chest x-ray shows right lower lobe pneumonia. Suspect this is all secondary to sepsis due to pneumonia. Pneumonia could be from aspiration due to dysphagia and dementia patient. Currently mildly hypoxic. Patient was started on IV antibiotics. Cultures so far are negative. At present suspect that this is going to be a recurrent problem for this patient and therefore recommended comfort care.  2.  Dysphagia. Dementia. Speech therapy consulted. Recommend nectar thick liquids. Currently discussed with family and goals of care is for remain comfortable. Will advance to soft diet.  3.  Goals of care discussion. I had extensive discussion with patient's daughter who is the POA. Patient has extensive medical comorbidity history. Currently has dysphagia and is unable to swallow safely. Has significant hypernatremia and acute kidney injury on chronic kidney disease. I recommended comfort approach. Patient has poor p.o. intake and prognosis is actually less than 4 weeks given his  multiple acute medical conditions. Family currently agreeable for residential hospice option. Case manager and social worker consulted. May require scheduled narcotic given patient's short of breath but currently will monitor.  4.  Acute kidney injury on chronic kidney disease stage IV. Renal function significantly worsening. Currently minimal urine output. Poor prognosis. Comfort care.  5. Type 2 Diabetes Mellitus, uncontroled with renal complication Comfort care. Monitor.  6.  Anemia of chronic kidney disease. Currently comfort care.  7.  Bilateral buttocks pressure ulcers POA. Comfort care.  Pressure Injury 02/09/18 Stage II -  Partial thickness loss of dermis presenting as a shallow open ulcer with a red, pink wound bed without slough. open wound with red wound bed, no visible drainage or slough.  (Active)  02/09/18 0930  Location: Buttocks  Location Orientation: Left  Staging: Stage II -  Partial thickness loss of dermis presenting as a shallow open ulcer with a red, pink wound bed without slough.  Wound Description (Comments): open wound with red wound bed, no visible drainage or slough.   Present on Admission: Yes     Pressure Injury 02/09/18 Stage II -  Partial thickness loss of dermis presenting as a shallow open ulcer with a red, pink wound bed without slough. wound with red wound bed; no drainage. (Active)  02/09/18 0930  Location: Buttocks  Location Orientation: Right  Staging: Stage II -  Partial thickness loss of dermis presenting as a shallow open ulcer with a red, pink wound bed without slough.  Wound Description (Comments): wound with red wound bed; no drainage.  Present on Admission: Yes     Pressure Injury 04/19/18 Stage II -  Partial thickness loss of dermis presenting as a shallow open ulcer with a red, pink wound bed  without slough. Healing pressure ulcer (Active)  04/19/18 2240  Location: Buttocks  Location Orientation: Right;Left  Staging: Stage II -   Partial thickness loss of dermis presenting as a shallow open ulcer with a red, pink wound bed without slough.  Wound Description (Comments): Healing pressure ulcer  Present on Admission: Yes     Diet: comfort care DVT Prophylaxis: comfort care  Advance goals of care discussion: DNR DNI  Family Communication: family was present at bedside, at the time of interview. The pt provided permission to discuss medical plan with the family. Opportunity was given to ask question and all questions were answered satisfactorily.   Disposition:  Discharge to residential hospice.  Consultants: none Procedures: none  Scheduled Meds: . latanoprost  1 drop Right Eye QHS  . mouth rinse  15 mL Mouth Rinse BID   Continuous Infusions: PRN Meds: acetaminophen **OR** acetaminophen, haloperidol **OR** haloperidol **OR** haloperidol lactate, HYDROmorphone (DILAUDID) injection, LORazepam **OR** LORazepam **OR** LORazepam, LORazepam, ondansetron **OR** ondansetron (ZOFRAN) IV, polyvinyl alcohol Antibiotics: Anti-infectives (From admission, onward)   Start     Dose/Rate Route Frequency Ordered Stop   02/19/19 1800  Ampicillin-Sulbactam (UNASYN) 3 g in sodium chloride 0.9 % 100 mL IVPB  Status:  Discontinued     3 g 200 mL/hr over 30 Minutes Intravenous Every 12 hours 02/19/19 0855 02/19/19 1543   02/19/19 0600  ceFEPIme (MAXIPIME) 2 g in sodium chloride 0.9 % 100 mL IVPB  Status:  Discontinued     2 g 200 mL/hr over 30 Minutes Intravenous Every 24 hours 02/18/19 0927 02/19/19 0835   02/18/19 0927  vancomycin variable dose per unstable renal function (pharmacist dosing)  Status:  Discontinued      Does not apply See admin instructions 02/18/19 0927 02/19/19 0835   02/18/19 0630  vancomycin (VANCOCIN) 2,000 mg in sodium chloride 0.9 % 500 mL IVPB     2,000 mg 250 mL/hr over 120 Minutes Intravenous  Once 02/18/19 0624 02/18/19 0957   02/18/19 0630  ceFEPIme (MAXIPIME) 2 g in sodium chloride 0.9 % 100 mL IVPB      2 g 200 mL/hr over 30 Minutes Intravenous  Once 02/18/19 0624 02/18/19 0714       Objective: Physical Exam: Vitals:   02/19/19 1200 02/19/19 1400 02/19/19 1600 02/19/19 1800  BP: (!) 125/39 137/85 (!) 120/45 (!) 132/29  Pulse: (!) 52 (!) 53 (!) 50 (!) 42  Resp: (!) 27 (!) 22 19 20   Temp: 97.8 F (36.6 C)     TempSrc: Oral     SpO2: 95% 96% 95% 99%  Weight:      Height:        Intake/Output Summary (Last 24 hours) at 02/19/2019 1823 Last data filed at 02/19/2019 1800 Gross per 24 hour  Intake 1928.33 ml  Output 700 ml  Net 1228.33 ml   Filed Weights   02/18/19 1105  Weight: 90.5 kg   General: alert and not oriented to time, place, and person. Appear in severe distress, affect unresponsive Eyes: PERRL, Conjunctiva normal ENT: Oral Mucosa Clear, dry  Neck: no JVD, no Abnormal Mass Or lumps Cardiovascular: S1 and S2 Present, no Murmur, peripheral pulses symmetrical Respiratory: increased respiratory effort, Bilateral Air entry equal and Decreased, no use of accessory muscle, bilateral  Crackles, Occasional  wheezes Abdomen: Bowel Sound present, Soft and no tenderness, no hernia Skin: no rashes  Extremities: no Pedal edema, no calf tenderness Neurologic: normal without focal findings, mental status, speech incoherent, alert and oriented  x3, PERLA, Motor strength 5/5 and symmetric and sensation grossly normal to light touch Gait not checked due to patient safety concerns  Data Reviewed: CBC: Recent Labs  Lab 02/18/19 0417 02/19/19 0231  WBC 13.1* 10.4  NEUTROABS 10.1*  --   HGB 10.8* 10.9*  HCT 37.2* 37.8*  MCV 98.2 98.4  PLT 377 818   Basic Metabolic Panel: Recent Labs  Lab 02/18/19 0417 02/18/19 1122 02/18/19 1508 02/18/19 2043 02/19/19 0231  NA 163* 163* 163* 162* 162*  K 3.9 4.0 4.4 4.2 4.7  CL 128* >130* >130* 131* >130*  CO2 23 21* 21* 20* 19*  GLUCOSE 139* 100* 133* 157* 249*  BUN 109* 107* 103* 97* 92*  CREATININE 4.08* 3.88* 3.99* 4.08* 4.09*   CALCIUM 9.6 9.1 9.0 9.1 9.2    Liver Function Tests: Recent Labs  Lab 02/18/19 0417 02/19/19 0231  AST 22 33  ALT 23 25  ALKPHOS 63 60  BILITOT 0.3 0.3  PROT 7.4 7.0  ALBUMIN 2.6* 2.5*   No results for input(s): LIPASE, AMYLASE in the last 168 hours. No results for input(s): AMMONIA in the last 168 hours. Coagulation Profile: No results for input(s): INR, PROTIME in the last 168 hours. Cardiac Enzymes: No results for input(s): CKTOTAL, CKMB, CKMBINDEX, TROPONINI in the last 168 hours. BNP (last 3 results) No results for input(s): PROBNP in the last 8760 hours. CBG: Recent Labs  Lab 02/18/19 2125 02/19/19 0451 02/19/19 0810 02/19/19 1245 02/19/19 1702  GLUCAP 177* 224* 241* 206* 202*   Studies: Ct Head Wo Contrast  Result Date: 02/19/2019 CLINICAL DATA:  Altered level of consciousness, unexplained. Confusion yesterday. EXAM: CT HEAD WITHOUT CONTRAST TECHNIQUE: Contiguous axial images were obtained from the base of the skull through the vertex without intravenous contrast. COMPARISON:  Head CT dated 02/09/2018. FINDINGS: Brain: There is mild generalized age related parenchymal atrophy with commensurate dilatation of the ventricles and sulci. Chronic small vessel ischemic changes again noted within the bilateral periventricular and subcortical white matter regions. There is no mass, hemorrhage, edema or other evidence of acute parenchymal abnormality. No extra-axial hemorrhage. Vascular: No hyperdense vessel or unexpected calcification. Skull: Normal. Negative for fracture or focal lesion. Sinuses/Orbits: No acute finding. Other: None. IMPRESSION: 1. No acute findings. No intracranial mass, hemorrhage or edema. 2. Atrophy and chronic ischemic changes in the white matter. Electronically Signed   By: Franki Cabot M.D.   On: 02/19/2019 10:52     Time spent: The patient is critically ill with multiple organ systems failure and requires high complexity decision making for assessment  and support, frequent evaluation and titration of therapies. Critical Care Time devoted to patient care services described in this note is 35 minutes   Author: Berle Mull, MD [facility] 02/19/2019 6:23 PM  To reach On-call, see care teams to locate the attending and reach out to them via [email or web address]. If 7PM-7AM, please contact night-coverage If you still have difficulty reaching the attending provider, please page the [facility] (Director on Call) for [facility] on amion for assistance.

## 2019-02-19 NOTE — Progress Notes (Signed)
PT Cancellation Note  Patient Details Name: Gregory Hatfield MRN: [id number] DOB: [birth year]   Cancelled Treatment:    Reason Eval/Treat Not Completed: PT screened, no needs identified, will sign off. Plan is for [facility]. Will sign off.   Weston Anna, PT Acute Rehabilitation Services Pager: [phone] Office: [phone]

## 2019-02-19 NOTE — TOC Initial Note (Signed)
Transition of Care [facility]) - Initial/Assessment Note    Patient Details  Name: BOBBIE VALLETTA MRN: [id number] Date of Birth: 09-Sep-1925  [facility] CM/SW Contact:    Lynnell Catalan, RN Phone Number:[phone] 02/19/2019, 11:33 AM  Clinical Narrative:                 CM consult for Residential Hospice. Per MD family want [facility]. This CM contacted daughter Janifer Adie and Janifer Adie did choose [facility]. Referral called to [place] liaison.  Expected Discharge Plan: [place] Barriers to Discharge: No Barriers Identified   Patient Goals and CMS Choice        Expected Discharge Plan and Services Expected Discharge Plan: [place] arrangements for the past 2 months: [place] Expected Discharge Date: (unknown)                                    Prior Living Arrangements/Services Living arrangements for the past 2 months: [place]                     Activities of Daily Living Home Assistive Devices/Equipment: Blood pressure cuff, Hospital bed, Grab bars around toilet, Grab bars in shower, Hand-held shower hose, Wheelchair, Scales, Eyeglasses, CBG Meter([facility] has necessary equipment for their residents) ADL Screening (condition at time of admission) Patient's cognitive ability adequate to safely complete daily activities?: No Is the patient deaf or have difficulty hearing?: 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) Does the patient have difficulty seeing, even when wearing glasses/contacts?: 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) Does the patient have difficulty concentrating, remembering, or making decisions?: Yes Patient able to express need for assistance with ADLs?: No Does the patient have difficulty dressing or bathing?: Yes Independently performs ADLs?: No Communication: Independent Dressing (OT): Dependent Is this a change from baseline?: Change from baseline, expected to last >3  days Grooming: Dependent Is this a change from baseline?: Change from baseline, expected to last >3 days Feeding: Dependent Is this a change from baseline?: Change from baseline, expected to last >3 days Bathing: Dependent Is this a change from baseline?: Change from baseline, expected to last >3 days Toileting: Dependent Is this a change from baseline?: Change from baseline, expected to last >3days In/Out Bed: Dependent Is this a change from baseline?: Change from baseline, expected to last >3 days Walks in Home: Dependent Is this a change from baseline?: Change from baseline, expected to last >3 days Does the patient have difficulty walking or climbing stairs?: Yes Weakness of Legs: Both Weakness of Arms/Hands: Both  Permission Sought/Granted                  Emotional Assessment              Admission diagnosis:  Hypernatremia [E87.0] Hypoglycemia [E16.2] AKI (acute kidney injury) ([place]) [N17.9] Pneumonia of right upper lobe due to infectious organism ([place]) [J18.1] Pneumonia [J18.9] Patient Active Problem List   Diagnosis Date Noted  . Hypoglycemia 02/18/2019  . Pneumonia 02/18/2019  . Acute respiratory failure with hypoxia ([place])   . CAP (community acquired pneumonia) 02/02/2019  . Hypokalemia 04/20/2018  . Syncope 04/19/2018  . Acute lower UTI 02/09/2018  . Acute encephalopathy 02/09/2018  . UTI (urinary tract infection) 02/09/2018  . Metabolic encephalopathy 17/51/0258  . Pressure injury of skin 02/09/2018  . Peripheral edema   .  Chronic diastolic CHF (congestive heart failure) ([place]) 01/16/2018  . Symptomatic bradycardia 10/06/2016  . Diabetes mellitus with complication ([place]) 72/02/4708  . Acute on chronic diastolic heart failure ([place]) 10/06/2016  . Hyperlipidemia 01/12/2016  . Prostate cancer ([place]) 10/12/2015  . Diabetic retinopathy ([place]) 10/12/2015  . Goals of care, counseling/discussion 10/12/2015  . Open-angle glaucoma 07/06/2015  . CKD stage 4 due  to type 2 diabetes mellitus ([place]) 06/03/2015  . Vitamin D deficiency 06/03/2015  . Medication management 06/03/2015  . Insulin-requiring or dependent type II diabetes mellitus ([place]) 05/05/2015  . Essential hypertension 05/05/2015  . SDAT (senile dementia of Alzheimer's type) ([place]) 06/16/2013   PCP:  Patient, No Pcp Per Pharmacy:  No Pharmacies Listed    Social Determinants of Health (SDOH) Interventions    Readmission Risk Interventions No flowsheet data found.

## 2019-02-19 NOTE — Progress Notes (Signed)
OT Cancellation Note  Patient Details Name: Gregory Hatfield MRN: [id number] DOB: 1925-08-03   Cancelled Treatment:    Reason Eval/Treat Not Completed: Other (comment).  Plan is [facility]. Will sign off.  Sheily Lineman 02/19/2019, 12:59 PM  Lesle Chris, OTR/L Acute Rehabilitation Services [phone] [facility] pager [phone] office 02/19/2019

## 2019-02-19 NOTE — Progress Notes (Signed)
CRITICAL VALUE ALERT  Critical Value:  Chloride >130, Na 162  Date & Time Notied:  0325 02/19/2019   Provider Notified: NA results same as previously reported, pt DNR  Orders Received/Actions taken: NA see above

## 2019-02-19 NOTE — Progress Notes (Signed)
Manufacturing engineer [facility]) Hospital Liaison note.   Received request from Marney Doctor, [facility] for family interest in [facility]. Chart reviewed and spoke with family to acknowledge referral.   Unfortunately [place] is not able to offer a room today. Family was also agreeable to transfer to [place], but they are not able to offer a room today either.   Family and CSW are aware [place] liaison will follow up with case manager and family tomorrow or sooner if room becomes available. Please do not hesitate to call with questions.   Thank you,   Farrel Gordon, RN, [facility]   [place]     [place] are on AMION

## 2019-02-19 NOTE — Progress Notes (Signed)
  Speech Language Pathology Treatment: Dysphagia  Patient Details Name: Gregory Hatfield MRN: [id number] DOB: March 23, 1926 Today's Date: 02/19/2019 Time: 6967-8938 SLP Time Calculation (min) (ACUTE ONLY): 22 min  Assessment / Plan / Recommendation Clinical Impression  Pt continues with reflexive cough with HOB elevation, suspect pooled secretions spilling into airway; He is more responsive today to questions and following directions - note mild facial asymmetry as well as decreased eye opening on left = This was not observed/noted yesterday during evaluation but pt was minimally participative during session.  Pt willing to accept intake applesauce and apple juice *nectar*.   Variability noted with swallow initiation although definitively delayed overall.  Pt did elicit swallow with 9 boluses of nectar/puree without indication of airway compromise.  However after the 9th swallow of applesauce, he presented with overt coughing. Cough was weak and not productive.  SLP at that time ceased intake as it no longer appeared comfortable for pt.  MD changing medications to IV that may be modified - thank you.     Recommend continue nectar via tsp only - as increased viscocity results in increased pharyngeal residuals on his test and will be less comfortable when aspirated.  No family present at this time to educate.  Of note, SLP spoke to SNF SLP who reports she saw pt on Friday and Saturday and he was feeding himself with poor intake and decreased endurance *difficulty sitting upright* but no s/s of aspiration.  She had not seen pt after Saturday 02/16/2019.   Marland Kitchen    HPI HPI: Gregory Hatfield is a 85 y.o. male with PMH of dementia, DM II, CKD stage 4, CHF, and HTN who presents with fever and shortness of breath with recent hospital stay after fall, pt transferred from [facility] facility to SNF after most recent stay.  He participated in [facility] 8/20 showing aspiration of thin - before and after swallowing.  Pt  was at SNF [facility]) since last Tuesday.  Pt CXR concerning for asp pna, right lobe pna.  Swallow eval ordered.      SLP Plan  Continue with current plan of care       Recommendations  Diet recommendations: Nectar-thick liquid Liquids provided via: Teaspoon Medication Administration: Via alternative means(crushed with nectar or puree - oral suction if pt does not swallow) Supervision: Full supervision/cueing for compensatory strategies Compensations: Slow rate;Small sips/bites(oral suction if pt does not swallow) Postural Changes and/or Swallow Maneuvers: Seated upright 90 degrees;Upright 30-60 min after meal                Oral Care Recommendations: Oral care QID Follow up Recommendations: Skilled Nursing facility SLP Visit Diagnosis: Dysphagia, oropharyngeal phase (R13.12) Plan: Continue with current plan of care       GO              Luanna Salk, [place] [facility] SLP [place] Pager [phone] Office [phone]   Macario Golds 02/19/2019, 9:13 AM

## 2019-02-20 DIAGNOSIS — J690 Pneumonitis due to inhalation of food and vomit: Secondary | ICD-10-CM

## 2019-02-20 NOTE — Progress Notes (Signed)
PROGRESS NOTE    Gregory Hatfield  IRJ:[id number] DOB: April 21, 1926 DOA: 02/18/2019 PCP: Patient, No Pcp Per   Brief Narrative: Gregory Hatfield is a 85 y.o. dementia, type 2 diabetes mellitus, stage IV CKD, hyperlipidemia, chronic diastolic heart failure, bradycardia. Patient admitted secondary to confusion and found to have aspiration pneumonia. Currently transitioned to comfort care.   Assessment & Plan:   Active Problems:   Hypoglycemia   Pneumonia   Aspiration pneumonia Dysphagia Patient was initially started on IV antibiotics, Vancomycin, Cefepime. Goals of care discussions lead to transition to full comfort care. -Soft diet  AKI on CKD stage IV Comfort care currently.  Diabetes mellitus, type 2 Comfort care. No insulin correction  Hypernatremia In setting of poor oral intake. Currently comfort care  Anemia of chronic disease  Bilateral buttock pressure ulcers, POA   DVT prophylaxis: None Code Status:   Code Status: DNR Family Communication: None Disposition Plan: Discharge to hospice when bed is available   Consultants:   None  Procedures:   None  Antimicrobials:  Vancomycin  Cefepime    Subjective: Wanted me to hold his hand. States no concerns otherwise.  Objective: Vitals:   02/20/19 0400 02/20/19 0700 02/20/19 0800 02/20/19 0941  BP: (!) 126/49   (!) 111/47  Pulse:   (!) 51 (!) 53  Resp: 12     Temp: 98.5 F (36.9 C)     TempSrc: Oral     SpO2:  98% 97% 97%  Weight:      Height:        Intake/Output Summary (Last 24 hours) at 02/20/2019 1822 Last data filed at 02/20/2019 0400 Gross per 24 hour  Intake 39.77 ml  Output 175 ml  Net -135.23 ml   Filed Weights   02/18/19 1105  Weight: 90.5 kg    Examination:  General exam: Appears calm and comfortable Respiratory system: Clear to auscultation. Respiratory effort normal. Cardiovascular system: S1 & S2 heard, RRR. No murmurs, rubs, gallops or clicks. Gastrointestinal system:  Abdomen is nondistended, soft and nontender. No organomegaly or masses felt. Normal bowel sounds heard. Central nervous system: Alert. No focal neurological deficits. Extremities: No edema. No calf tenderness Skin: No cyanosis. No rashes   Data Reviewed: I have personally reviewed following labs and imaging studies  CBC: Recent Labs  Lab 02/18/19 0417 02/19/19 0231  WBC 13.1* 10.4  NEUTROABS 10.1*  --   HGB 10.8* 10.9*  HCT 37.2* 37.8*  MCV 98.2 98.4  PLT 377 301   Basic Metabolic Panel: Recent Labs  Lab 02/18/19 0417 02/18/19 1122 02/18/19 1508 02/18/19 2043 02/19/19 0231  NA 163* 163* 163* 162* 162*  K 3.9 4.0 4.4 4.2 4.7  CL 128* >130* >130* 131* >130*  CO2 23 21* 21* 20* 19*  GLUCOSE 139* 100* 133* 157* 249*  BUN 109* 107* 103* 97* 92*  CREATININE 4.08* 3.88* 3.99* 4.08* 4.09*  CALCIUM 9.6 9.1 9.0 9.1 9.2   GFR: Estimated Creatinine Clearance: 12.9 mL/min (A) (by C-G formula based on SCr of 4.09 mg/dL (H)). Liver Function Tests: Recent Labs  Lab 02/18/19 0417 02/19/19 0231  AST 22 33  ALT 23 25  ALKPHOS 63 60  BILITOT 0.3 0.3  PROT 7.4 7.0  ALBUMIN 2.6* 2.5*   No results for input(s): LIPASE, AMYLASE in the last 168 hours. No results for input(s): AMMONIA in the last 168 hours. Coagulation Profile: No results for input(s): INR, PROTIME in the last 168 hours. Cardiac Enzymes: No results for input(s):  CKTOTAL, CKMB, CKMBINDEX, TROPONINI in the last 168 hours. BNP (last 3 results) No results for input(s): PROBNP in the last 8760 hours. HbA1C: No results for input(s): HGBA1C in the last 72 hours. CBG: Recent Labs  Lab 02/19/19 0451 02/19/19 0810 02/19/19 1245 02/19/19 1702 02/19/19 2143  GLUCAP 224* 241* 206* 202* 188*   Lipid Profile: No results for input(s): CHOL, HDL, LDLCALC, TRIG, CHOLHDL, LDLDIRECT in the last 72 hours. Thyroid Function Tests: No results for input(s): TSH, T4TOTAL, FREET4, T3FREE, THYROIDAB in the last 72 hours. Anemia  Panel: No results for input(s): VITAMINB12, FOLATE, FERRITIN, TIBC, IRON, RETICCTPCT in the last 72 hours. Sepsis Labs: Recent Labs  Lab 02/18/19 0417  LATICACIDVEN 0.9    Recent Results (from the past 240 hour(s))  SARS Coronavirus 2 [facility] order, Performed in [facility] hospital lab) Nasopharyngeal Nasopharyngeal Swab     Status: None   Collection Time: 02/18/19  5:43 AM   Specimen: Nasopharyngeal Swab  Result Value Ref Range Status   SARS Coronavirus 2 NEGATIVE NEGATIVE Final    Comment: (NOTE) If result is NEGATIVE SARS-CoV-2 target nucleic acids are NOT DETECTED. The SARS-CoV-2 RNA is generally detectable in upper and lower  respiratory specimens during the acute phase of infection. The lowest  concentration of SARS-CoV-2 viral copies this assay can detect is 250  copies / mL. A negative result does not preclude SARS-CoV-2 infection  and should not be used as the sole basis for treatment or other  patient management decisions.  A negative result may occur with  improper specimen collection / handling, submission of specimen other  than nasopharyngeal swab, presence of viral mutation(s) within the  areas targeted by this assay, and inadequate number of viral copies  (<250 copies / mL). A negative result must be combined with clinical  observations, patient history, and epidemiological information. If result is POSITIVE SARS-CoV-2 target nucleic acids are DETECTED. The SARS-CoV-2 RNA is generally detectable in upper and lower  respiratory specimens dur ing the acute phase of infection.  Positive  results are indicative of active infection with SARS-CoV-2.  Clinical  correlation with patient history and other diagnostic information is  necessary to determine patient infection status.  Positive results do  not rule out bacterial infection or co-infection with other viruses. If result is PRESUMPTIVE POSTIVE SARS-CoV-2 nucleic acids MAY BE PRESENT.   A presumptive positive  result was obtained on the submitted specimen  and confirmed on repeat testing.  While 2019 novel coronavirus  (SARS-CoV-2) nucleic acids may be present in the submitted sample  additional confirmatory testing may be necessary for epidemiological  and / or clinical management purposes  to differentiate between  SARS-CoV-2 and other Sarbecovirus currently known to infect humans.  If clinically indicated additional testing with an alternate test  methodology [phone]) is advised. The SARS-CoV-2 RNA is generally  detectable in upper and lower respiratory sp ecimens during the acute  phase of infection. The expected result is Negative. Fact Sheet for Patients:  StrictlyIdeas.no Fact Sheet for Healthcare Providers: BankingDealers.co.za This test is not yet approved or cleared by the [place] FDA and has been authorized for detection and/or diagnosis of SARS-CoV-2 by FDA under an Emergency Use Authorization (EUA).  This EUA will remain in effect (meaning this test can be used) for the duration of the COVID-19 declaration under Section 564(b)(1) of the Act, 21 U.S.C. section 360bbb-3(b)(1), unless the authorization is terminated or revoked sooner. Performed at [facility], [address]  [place], [place] [address]   MRSA PCR Screening     Status: None   Collection Time: 02/18/19 11:20 AM   Specimen: Nasopharyngeal  Result Value Ref Range Status   MRSA by PCR NEGATIVE NEGATIVE Final    Comment:        The GeneXpert MRSA Assay (FDA approved for NASAL specimens only), is one component of a comprehensive MRSA colonization surveillance program. It is not intended to diagnose MRSA infection nor to guide or monitor treatment for MRSA infections. Performed at [facility], [address], [place], [place] [address]          Radiology Studies: Ct Head Wo Contrast  Result Date: 02/19/2019  CLINICAL DATA:  Altered level of consciousness, unexplained. Confusion yesterday. EXAM: CT HEAD WITHOUT CONTRAST TECHNIQUE: Contiguous axial images were obtained from the base of the skull through the vertex without intravenous contrast. COMPARISON:  Head CT dated 02/09/2018. FINDINGS: Brain: There is mild generalized age related parenchymal atrophy with commensurate dilatation of the ventricles and sulci. Chronic small vessel ischemic changes again noted within the bilateral periventricular and subcortical white matter regions. There is no mass, hemorrhage, edema or other evidence of acute parenchymal abnormality. No extra-axial hemorrhage. Vascular: No hyperdense vessel or unexpected calcification. Skull: Normal. Negative for fracture or focal lesion. Sinuses/Orbits: No acute finding. Other: None. IMPRESSION: 1. No acute findings. No intracranial mass, hemorrhage or edema. 2. Atrophy and chronic ischemic changes in the white matter. Electronically Signed   By: Franki Cabot M.D.   On: 02/19/2019 10:52        Scheduled Meds: . latanoprost  1 drop Right Eye QHS  . mouth rinse  15 mL Mouth Rinse BID   Continuous Infusions:   LOS: 2 days     Cordelia Poche, MD [facility] 02/20/2019, 6:22 PM  If 7PM-7AM, please contact night-coverage [email or web address]

## 2019-02-20 NOTE — Progress Notes (Signed)
[facility] ([facility])  Made TOC manager Alinda Sierras aware [facility] is not able to offer a room today. Will continue to follow until disposition determined.   Thank you,  Erling Conte, LCSW [phone]

## 2019-02-20 NOTE — Discharge Summary (Signed)
Physician Discharge Summary  Gregory Hatfield RJJ:[id number] DOB: March 17, 1926 DOA: 02/18/2019  PCP: Patient, No Pcp Per  Admit date: 02/18/2019 Discharge date: 02/21/2019   Discharge Condition: Hospice CODE STATUS: DNR Diet recommendation: Comfort/soft diet   Brief/Interim Summary:  Admission HPI written by Hosie Poisson, MD   Chief Complaint: HYPOGLYCEMIA   HPI: Gregory Hatfield is a 85 y.o. male with medical history significant of dementia, type 2 diabetes mellitus, stage IV CKD, hyperlipidemia, chronic diastolic heart failure, bradycardia who was recently admitted for pneumonia and discharged to [facility] health/22/2020 presents today for a blood sugar of 30, U AMS gave the patient glucagon and dextrose and his sugar was 87 upon arrival to ED.  Patient was hypothermic and is confused at baseline and could not provide me any history..  Most of the history was obtained from his daughter over the phone and ED notes. As per the daughter patient has been declining over the last few months and has progressively deteriorated had poor oral intake, poor appetite. She wishes to continue with IV fluids and IV antibiotics for at least 24 hours and if no improvement she would like to transition him to hospice as appropriate.   ED Course: On arrival to ED both arm neck with temp of 94.2, bradycardic with heart rate between 30s to 50s, tachypneic, hypotensive with blood pressure 105/50.  Labs were significant for sodium of 163, BUN of 109, creatinine of 4.08, troponin of 31, lactic acid of 0.9, WBC count of 13.1, hemoglobin of 10.8, B19 screening test is negative, urine analysis shows trace leukocytes and rare bacteria.  Chest x-ray showed increased right upper lobe airspace disease, suspicious for pneumonia or aspiration.  Twelve-lead EKG shows sinus bradycardia with left bundle branch block and unchanged from before.   He was referred to medical service for admission for persistent/worsening  pneumonia and acute renal failure and sepsis. he will be admitted to stepdown.    Hospital course:  Aspiration pneumonia Dysphagia Patient was initially started on IV antibiotics, Vancomycin, Cefepime. Goals of care discussions lead to transition to full comfort care. Discharge to hospice.  AKI on CKD stage IV Comfort care currently.  Diabetes mellitus, type 2 Comfort care. No insulin correction  Hypernatremia In setting of poor oral intake. Currently comfort care  Anemia of chronic disease  Bilateral buttock pressure ulcers, POA   Discharge Diagnoses:  Active Problems:   Hypoglycemia   Pneumonia    Discharge Instructions   Allergies as of 02/21/2019      Reactions   Ace Inhibitors Other (See Comments), Cough   Chronic cough   Demerol Other (See Comments)   Hard to wake up   Cosopt [dorzolamide Hcl-timolol Mal] Other (See Comments)   Cause heart rate to drop   Flomax [tamsulosin Hcl] Other (See Comments)   Low BP   Morphine And Related Other (See Comments)   hallucinations   Oxytrol [oxybutynin] Rash      Medication List    STOP taking these medications   acetaminophen 650 MG CR tablet Commonly known as: TYLENOL   amLODipine 10 MG tablet Commonly known as: NORVASC   aspirin 81 MG tablet   B-D UF III MINI PEN NEEDLES 31G X 5 MM Misc Generic drug: Insulin Pen Needle   carvedilol 6.25 MG tablet Commonly known as: COREG   cloNIDine 0.2 mg/24hr patch Commonly known as: CATAPRES - Dosed in mg/24 hr   diclofenac sodium 1 % Gel Commonly known as: VOLTAREN  furosemide 20 MG tablet Commonly known as: LASIX   HumuLIN 70/30 (70-30) 100 UNIT/ML injection Generic drug: insulin NPH-regular Human   hydrALAZINE 50 MG tablet Commonly known as: APRESOLINE   latanoprost 0.005 % ophthalmic solution Commonly known as: XALATAN   loperamide 2 MG tablet Commonly known as: IMODIUM A-D   loratadine 10 MG tablet Commonly known as: CLARITIN     multivitamin tablet   Myrbetriq 50 MG Tb24 tablet Generic drug: mirabegron ER   NovoLOG FlexPen 100 UNIT/ML FlexPen Generic drug: insulin aspart   ONE TOUCH ULTRA TEST test strip Generic drug: glucose blood   potassium chloride SA 20 MEQ tablet Commonly known as: K-DUR   PROBIOTIC PO   Simbrinza 1-0.2 % Susp Generic drug: Brinzolamide-Brimonidine   simethicone 80 MG chewable tablet Commonly known as: MYLICON   SYSTANE OP   Vitamin D3 125 MCG (5000 UT) Caps   Vitamin D3 250 MCG (10000 UT) capsule       Allergies  Allergen Reactions   Ace Inhibitors Other (See Comments) and Cough    Chronic cough   Demerol Other (See Comments)    Hard to wake up   Cosopt [Dorzolamide Hcl-Timolol Mal] Other (See Comments)    Cause heart rate to drop   Flomax [Tamsulosin Hcl] Other (See Comments)    Low BP   Morphine And Related Other (See Comments)    hallucinations   Oxytrol [Oxybutynin] Rash    Consultations:  None   Procedures/Studies: Ct Abdomen Pelvis Wo Contrast  Result Date: 02/02/2019 CLINICAL DATA:  85 year old male with abdominal distension and fever EXAM: CT ABDOMEN AND PELVIS WITHOUT CONTRAST TECHNIQUE: Multidetector CT imaging of the abdomen and pelvis was performed following the standard protocol without IV contrast. COMPARISON:  Prior CT scan of the abdomen and pelvis 01/17/2018 FINDINGS: Lower chest: Trace right pleural effusion, improved compared to prior. Minimal dependent atelectasis. The remainder of the visualized lungs are clear. The intracardiac blood pool is hypodense relative to the adjacent myocardium consistent with anemia. The visualized cardiac structures are enlarged. Small hiatal hernia. Hepatobiliary: Normal hepatic contour and morphology. No discrete hepatic lesions. Normal appearance of the gallbladder. No intra or extrahepatic biliary ductal dilatation. Pancreas: Unremarkable. No pancreatic ductal dilatation or surrounding inflammatory  changes. Spleen: Normal in size without focal abnormality. Adrenals/Urinary Tract: Indeterminate left adrenal nodule measures approximately 2.7 cm which is essentially unchanged compared to the prior study by my measurement. The right adrenal gland is unremarkable. No evidence of hydronephrosis or nephrolithiasis. Nonspecific perinephric stranding is present bilaterally. The ureters and bladder are unremarkable. Evaluation of the bladder is slightly limited secondary to streak artifact from the left hip prosthesis. Stomach/Bowel: Moderate gas throughout the colon, particularly anteriorly in the redundant sigmoid and transverse colon. No focal bowel wall thickening or evidence of obstruction. Several loops of small bowel contain fluid and gas and are minimally but not significantly dilated. Again, no bowel wall thickening. Vascular/Lymphatic: Limited evaluation in the absence of intravenous contrast. Atherosclerotic calcifications present throughout the aorta and branch arteries. No suspicious lymphadenopathy. Reproductive: Prostate is unremarkable. Other: Omental fat containing ventral hernia just right of midline and superior to the umbilicus. Focal soft tissue thickening in the umbilicus is similar compared to prior. No new inflammatory changes. Musculoskeletal: No acute fracture or aggressive appearing lytic or blastic osseous lesion. Surgical changes of prior left hip arthroplasty and posterior lumbar interbody fusion from L3-L5 without evidence of hardware complication. Mild right hip joint osteoarthritis. Multilevel degenerative disc disease most notable at  L5-S1. IMPRESSION: 1. Mild gaseous distension of the redundant sigmoid and transverse colons as well as a small to moderate amount of gas in the small bowel. This may account for the patient's abdominal distension. 2. No evidence of bowel wall thickening or obstruction. 3. Trace right pleural effusion, improved compared to 01/17/2018. 4. The intracardiac  blood pool is hypodense relative to the adjacent myocardium consistent with anemia. 5.  Aortic Atherosclerosis (ICD10-170.0). 6. Stable nonspecific left adrenal nodule. One year stability suggests that this represents a benign adenoma. 7. Stable omental fat containing ventral hernia right of midline and superior to the umbilicus. 8. Surgical changes of prior left hip arthroplasty and L3-L5 posterior lumbar interbody fusion. Electronically Signed   By: Jacqulynn Cadet M.D.   On: 02/02/2019 10:13   Dg Abd 1 View  Result Date: 02/04/2019 CLINICAL DATA:  abd distention/pain,,sob too EXAM: ABDOMEN - 1 VIEW COMPARISON:  A CT abdomen pelvis 02/02/2019 FINDINGS: Mildly distended air-filled loops of bowel are seen throughout the abdomen in a nonobstructive pattern. No supine evidence for free intraperitoneal air. Lumbar spine and left hip hardware is noted. Lung bases are excluded from field of view. IMPRESSION: Nonobstructive bowel gas pattern with mild gaseous distention. Electronically Signed   By: Audie Pinto M.D.   On: 02/04/2019 13:31   Ct Head Wo Contrast  Result Date: 02/19/2019 CLINICAL DATA:  Altered level of consciousness, unexplained. Confusion yesterday. EXAM: CT HEAD WITHOUT CONTRAST TECHNIQUE: Contiguous axial images were obtained from the base of the skull through the vertex without intravenous contrast. COMPARISON:  Head CT dated 02/09/2018. FINDINGS: Brain: There is mild generalized age related parenchymal atrophy with commensurate dilatation of the ventricles and sulci. Chronic small vessel ischemic changes again noted within the bilateral periventricular and subcortical white matter regions. There is no mass, hemorrhage, edema or other evidence of acute parenchymal abnormality. No extra-axial hemorrhage. Vascular: No hyperdense vessel or unexpected calcification. Skull: Normal. Negative for fracture or focal lesion. Sinuses/Orbits: No acute finding. Other: None. IMPRESSION: 1. No acute  findings. No intracranial mass, hemorrhage or edema. 2. Atrophy and chronic ischemic changes in the white matter. Electronically Signed   By: Franki Cabot M.D.   On: 02/19/2019 10:52   US Renal  Result Date: 02/18/2019 CLINICAL DATA:  Acute kidney injury. EXAM: RENAL / URINARY TRACT ULTRASOUND COMPLETE COMPARISON:  CT abdomen and pelvis 02/02/2019. FINDINGS: Right Kidney: Renal measurements: 11.1 x 5.0 x 5.6 cm = volume: 162.4 mL . Echogenicity within normal limits. Cortex is thinned with increased echogenicity. No mass or hydronephrosis visualized. Left Kidney: Renal measurements: 10.1 x 5.5 x 4.3 cm = volume: 125.1 mL. Cortex is thinned with increased echogenicity. No mass or hydronephrosis visualized. Bladder: Appears normal for degree of bladder distention. IMPRESSION: Negative for hydronephrosis or other acute abnormality. Senescent change both kidneys. Electronically Signed   By: Inge Rise M.D.   On: 02/18/2019 13:05   Dg Chest Port 1 View  Result Date: 02/18/2019 CLINICAL DATA:  Altered mental status. Hyperglycemia. EXAM: PORTABLE CHEST 1 VIEW COMPARISON:  02/03/2019 FINDINGS: The heart size and mediastinal contours are within normal limits. Increased airspace disease is seen in the right upper lobe, suspicious for pneumonia or aspiration. Left lung is clear. No evidence of pleural effusion. IMPRESSION: Increased right upper lobe airspace disease, suspicious for pneumonia or aspiration. Electronically Signed   By: Marlaine Hind M.D.   On: 02/18/2019 05:57   Dg Chest Port 1 View  Result Date: 02/03/2019 CLINICAL DATA:  Initial evaluation  for acute shortness of breath. EXAM: PORTABLE CHEST 1 VIEW COMPARISON:  Prior radiograph from 02/02/2019. FINDINGS: Cardiomegaly, stable. Mediastinal silhouette within normal limits. Lungs mildly hypoinflated. Persistent and mildly progressed confluent opacity within the mid right lung, concerning for infiltrate. Superimposed bibasilar atelectatic changes.  Underlying perihilar vascular congestion without overt pulmonary edema. No obvious pleural effusion on this single frontal view of the chest. No pneumothorax. No acute osseous finding. Degenerative changes noted about the shoulders bilaterally. IMPRESSION: 1. Persistent and mildly progressed confluent opacity within the mid right lung, concerning for pneumonia. 2. Cardiomegaly with mild perihilar vascular congestion without overt pulmonary edema. Electronically Signed   By: Jeannine Boga M.D.   On: 02/03/2019 21:13   Dg Chest Port 1 View  Result Date: 02/02/2019 CLINICAL DATA:  Shortness of breath. EXAM: PORTABLE CHEST 1 VIEW COMPARISON:  04/19/2018 FINDINGS: Stable cardiomegaly and ectasia of thoracic aorta. Mild asymmetric opacity is noted in the right midlung field, which may be due to pulmonary infiltrate. Left lung appears clear. No evidence of pleural effusion. IMPRESSION: 1. Mild asymmetric opacity in right midlung field, which may be due to pulmonary infiltrate. Recommend continued radiographic follow-up, with two view chest radiograph if patient able. 2. Stable cardiomegaly. Electronically Signed   By: Marlaine Hind M.D.   On: 02/02/2019 08:55   Dg Swallowing Func-speech Pathology  Result Date: 02/09/2019 Objective Swallowing Evaluation: Type of Study: MBS-Modified Barium Swallow Study  Patient Details Name: Gregory Hatfield MRN: [id number] Date of Birth: 04-14-1926 Today's Date: 02/09/2019 Time: SLP Start Time (ACUTE ONLY): 54 -SLP Stop Time (ACUTE ONLY): 9470 SLP Time Calculation (min) (ACUTE ONLY): 32 min Past Medical History: Past Medical History: Diagnosis Date  Bradycardia 10/06/2016  CHF (congestive heart failure) ([place])   CKD stage 4 due to type 2 diabetes mellitus ([place]) 06/03/2015  Dyslipidemia associated with type 2 diabetes mellitus ([place]) 05/05/2015  Essential hypertension 05/05/2015  IDDM (insulin dependent diabetes mellitus) ([place])   SDAT (senile dementia of Alzheimer's type)  06/16/2013  Uncontrolled insulin-dependent diabetes mellitus with renal manifestation ([place]) 05/05/2015 Past Surgical History: Past Surgical History: Procedure Laterality Date  CARDIAC CATHETERIZATION  04/2004  normal r-sided pressure, mild pulm HTN  CARPAL TUNNEL RELEASE Right   CERVICAL SPINE SURGERY  1990's  LUMBAR SPINE SURGERY  2003  PROSTATE SURGERY  10/2010  transurethral resection   TOTAL HIP ARTHROPLASTY Left 01/17/2014  Procedure: LEFT TOTAL HIP ARTHROPLASTY;  Surgeon: Yvette Rack., MD;  Location: [facility];  Service: Orthopedics;  Laterality: Left;  TRANSTHORACIC ECHOCARDIOGRAM  05/2006  EF 60-70%; mild calcif of MV; mild MV regurg; LA mildly dilated  TRANSURETHRAL RESECTION OF PROSTATE N/A 08/22/2014  Procedure: TRANSURETHRAL RESECTION OF THE PROSTATE (TURP);  Surgeon: Marcia Brash, MD;  Location: [facility];  Service: Urology;  Laterality: N/A; HPI: Gregory Hatfield is a 85 y.o. male with PMH of dementia, DM II, CKD stage 4, CHF, and HTN who presents with fever and shortness of breath.   No data recorded Assessment / Plan / Recommendation CHL IP CLINICAL IMPRESSIONS 02/09/2019 Clinical Impression             Pt demonstrates a moderate orophayngeal dysphagia with decreased bolus cohesion leading to ill timed instances of partial bolus transit with thin liquids. Pt aspirated with sensation both before and after the initial swallow with thin liquid as pt transited oral residue but does not initiate a timely swallow response to protect airway. This occurs even when rate/size of bolsues are controlled externally. Note that delayed and  unrelated coughing also occurred during study. Pt cognitively is not capable of positional strategies. Pt tolerated nectar thick liquids even taken consecutively without difficulty and was also able to masticate regular solids. Recommend an unrestricted solid diet with nectar thick liquids. SLP to f/u at next level of care as pt to d/c today.  SLP Visit Diagnosis Dysphagia,  oropharyngeal phase (R13.12) Attention and concentration deficit following -- Frontal lobe and executive function deficit following -- Impact on safety and function Moderate aspiration risk   CHL IP TREATMENT RECOMMENDATION 02/09/2019 Treatment Recommendations Defer treatment plan to f/u with SLP   Prognosis 02/03/2019 Prognosis for Safe Diet Advancement Good Barriers to Reach Goals -- Barriers/Prognosis Comment -- CHL IP DIET RECOMMENDATION 02/09/2019 SLP Diet Recommendations Regular solids;Nectar thick liquid Liquid Administration via Cup;Straw Medication Administration Whole meds with liquid Compensations Minimize environmental distractions Postural Changes Seated upright at 90 degrees;Remain semi-upright after after feeds/meals (Comment)   CHL IP OTHER RECOMMENDATIONS 02/09/2019 Recommended Consults -- Oral Care Recommendations Oral care BID Other Recommendations Order thickener from pharmacy   CHL IP FOLLOW UP RECOMMENDATIONS 02/09/2019 Follow up Recommendations Skilled Nursing facility   [facility] IP FREQUENCY AND DURATION 02/03/2019 Speech Therapy Frequency (ACUTE ONLY) min 1 x/week Treatment Duration --      CHL IP ORAL PHASE 02/09/2019 Oral Phase Impaired Oral - Pudding Teaspoon -- Oral - Pudding Cup -- Oral - Honey Teaspoon -- Oral - Honey Cup -- Oral - Nectar Teaspoon -- Oral - Nectar Cup Decreased bolus cohesion;Premature spillage Oral - Nectar Straw Decreased bolus cohesion;Premature spillage Oral - Thin Teaspoon -- Oral - Thin Cup Decreased bolus cohesion;Premature spillage Oral - Thin Straw Decreased bolus cohesion;Premature spillage Oral - Puree WFL Oral - Mech Soft -- Oral - Regular WFL Oral - Multi-Consistency -- Oral - Pill WFL Oral Phase - Comment --  CHL IP PHARYNGEAL PHASE 02/09/2019 Pharyngeal Phase Impaired Pharyngeal- Pudding Teaspoon -- Pharyngeal -- Pharyngeal- Pudding Cup -- Pharyngeal -- Pharyngeal- Honey Teaspoon -- Pharyngeal -- Pharyngeal- Honey Cup -- Pharyngeal -- Pharyngeal- Nectar Teaspoon --  Pharyngeal -- Pharyngeal- Nectar Cup Delayed swallow initiation-pyriform sinuses Pharyngeal -- Pharyngeal- Nectar Straw Delayed swallow initiation-pyriform sinuses Pharyngeal -- Pharyngeal- Thin Teaspoon -- Pharyngeal -- Pharyngeal- Thin Cup Delayed swallow initiation-pyriform sinuses;Penetration/Aspiration before swallow;Penetration/Apiration after swallow;Pharyngeal residue - pyriform Pharyngeal Material enters airway, passes BELOW cords then ejected out;Material enters airway, passes BELOW cords and not ejected out despite cough attempt by patient Pharyngeal- Thin Straw Delayed swallow initiation-pyriform sinuses;Penetration/Aspiration before swallow;Penetration/Apiration after swallow;Pharyngeal residue - pyriform Pharyngeal Material enters airway, passes BELOW cords then ejected out Pharyngeal- Puree Delayed swallow initiation-vallecula Pharyngeal -- Pharyngeal- Mechanical Soft -- Pharyngeal -- Pharyngeal- Regular Delayed swallow initiation-vallecula Pharyngeal -- Pharyngeal- Multi-consistency -- Pharyngeal -- Pharyngeal- Pill Delayed swallow initiation-vallecula Pharyngeal -- Pharyngeal Comment --  No flowsheet data found. Herbie Baltimore, MA CCC-SLP Acute Rehabilitation Services Pager [phone] Office [phone] Lynann Beaver 02/09/2019, 12:38 PM                Subjective: [place] today. Not clear.  Discharge Exam: Vitals:   02/20/19 2109 02/21/19 0502  BP: (!) 128/50 (!) 139/58  Pulse: (!) 53 (!) 58  Resp: 18   Temp: 98.3 F (36.8 C) (!) 97.5 F (36.4 C)  SpO2: 94% 100%   Vitals:   02/20/19 0800 02/20/19 0941 02/20/19 2109 02/21/19 0502  BP:  (!) 111/47 (!) 128/50 (!) 139/58  Pulse: (!) 51 (!) 53 (!) 53 (!) 58  Resp:   18   Temp:   98.3  F (36.8 C) (!) 97.5 F (36.4 C)  TempSrc:      SpO2: 97% 97% 94% 100%  Weight:      Height:        General: Pt is alert, awake, not in acute distress   The results of significant diagnostics from this hospitalization  (including imaging, microbiology, ancillary and laboratory) are listed below for reference.     Microbiology: Recent Results (from the past 240 hour(s))  SARS Coronavirus 2 [facility] order, Performed in [facility] hospital lab) Nasopharyngeal Nasopharyngeal Swab     Status: None   Collection Time: 02/18/19  5:43 AM   Specimen: Nasopharyngeal Swab  Result Value Ref Range Status   SARS Coronavirus 2 NEGATIVE NEGATIVE Final    Comment: (NOTE) If result is NEGATIVE SARS-CoV-2 target nucleic acids are NOT DETECTED. The SARS-CoV-2 RNA is generally detectable in upper and lower  respiratory specimens during the acute phase of infection. The lowest  concentration of SARS-CoV-2 viral copies this assay can detect is 250  copies / mL. A negative result does not preclude SARS-CoV-2 infection  and should not be used as the sole basis for treatment or other  patient management decisions.  A negative result may occur with  improper specimen collection / handling, submission of specimen other  than nasopharyngeal swab, presence of viral mutation(s) within the  areas targeted by this assay, and inadequate number of viral copies  (<250 copies / mL). A negative result must be combined with clinical  observations, patient history, and epidemiological information. If result is POSITIVE SARS-CoV-2 target nucleic acids are DETECTED. The SARS-CoV-2 RNA is generally detectable in upper and lower  respiratory specimens dur ing the acute phase of infection.  Positive  results are indicative of active infection with SARS-CoV-2.  Clinical  correlation with patient history and other diagnostic information is  necessary to determine patient infection status.  Positive results do  not rule out bacterial infection or co-infection with other viruses. If result is PRESUMPTIVE POSTIVE SARS-CoV-2 nucleic acids MAY BE PRESENT.   A presumptive positive result was obtained on the submitted specimen  and confirmed on  repeat testing.  While 2019 novel coronavirus  (SARS-CoV-2) nucleic acids may be present in the submitted sample  additional confirmatory testing may be necessary for epidemiological  and / or clinical management purposes  to differentiate between  SARS-CoV-2 and other Sarbecovirus currently known to infect humans.  If clinically indicated additional testing with an alternate test  methodology [phone]) is advised. The SARS-CoV-2 RNA is generally  detectable in upper and lower respiratory sp ecimens during the acute  phase of infection. The expected result is Negative. Fact Sheet for Patients:  StrictlyIdeas.no Fact Sheet for Healthcare Providers: BankingDealers.co.za This test is not yet approved or cleared by the [place] FDA and has been authorized for detection and/or diagnosis of SARS-CoV-2 by FDA under an Emergency Use Authorization (EUA).  This EUA will remain in effect (meaning this test can be used) for the duration of the COVID-19 declaration under Section 564(b)(1) of the Act, 21 U.S.C. section 360bbb-3(b)(1), unless the authorization is terminated or revoked sooner. Performed at [facility], [place] [address], [place], [place] [address]   MRSA PCR Screening     Status: None   Collection Time: 02/18/19 11:20 AM   Specimen: Nasopharyngeal  Result Value Ref Range Status   MRSA by PCR NEGATIVE NEGATIVE Final    Comment:        The GeneXpert MRSA  Assay (FDA approved for NASAL specimens only), is one component of a comprehensive MRSA colonization surveillance program. It is not intended to diagnose MRSA infection nor to guide or monitor treatment for MRSA infections. Performed at [facility], [place] [address] [place], [place] [address]      Labs: BNP (last 3 results) Recent Labs    04/19/18 1943 02/02/19 0740  BNP 267.6* 726.2*   Basic Metabolic Panel: Recent Labs  Lab  02/18/19 0417 02/18/19 1122 02/18/19 1508 02/18/19 2043 02/19/19 0231  NA 163* 163* 163* 162* 162*  K 3.9 4.0 4.4 4.2 4.7  CL 128* >130* >130* 131* >130*  CO2 23 21* 21* 20* 19*  GLUCOSE 139* 100* 133* 157* 249*  BUN 109* 107* 103* 97* 92*  CREATININE 4.08* 3.88* 3.99* 4.08* 4.09*  CALCIUM 9.6 9.1 9.0 9.1 9.2   Liver Function Tests: Recent Labs  Lab 02/18/19 0417 02/19/19 0231  AST 22 33  ALT 23 25  ALKPHOS 63 60  BILITOT 0.3 0.3  PROT 7.4 7.0  ALBUMIN 2.6* 2.5*   No results for input(s): LIPASE, AMYLASE in the last 168 hours. No results for input(s): AMMONIA in the last 168 hours. CBC: Recent Labs  Lab 02/18/19 0417 02/19/19 0231  WBC 13.1* 10.4  NEUTROABS 10.1*  --   HGB 10.8* 10.9*  HCT 37.2* 37.8*  MCV 98.2 98.4  PLT 377 288   Cardiac Enzymes: No results for input(s): CKTOTAL, CKMB, CKMBINDEX, TROPONINI in the last 168 hours. BNP: Invalid input(s): POCBNP CBG: Recent Labs  Lab 02/19/19 0451 02/19/19 0810 02/19/19 1245 02/19/19 1702 02/19/19 2143  GLUCAP 224* 241* 206* 202* 188*   D-Dimer No results for input(s): DDIMER in the last 72 hours. Hgb A1c No results for input(s): HGBA1C in the last 72 hours. Lipid Profile No results for input(s): CHOL, HDL, LDLCALC, TRIG, CHOLHDL, LDLDIRECT in the last 72 hours. Thyroid function studies No results for input(s): TSH, T4TOTAL, T3FREE, THYROIDAB in the last 72 hours.  Invalid input(s): FREET3 Anemia work up No results for input(s): VITAMINB12, FOLATE, FERRITIN, TIBC, IRON, RETICCTPCT in the last 72 hours. Urinalysis    Component Value Date/Time   COLORURINE YELLOW 02/18/2019 0431   APPEARANCEUR TURBID (A) 02/18/2019 0431   LABSPEC 1.016 02/18/2019 0431   PHURINE 5.0 02/18/2019 0431   GLUCOSEU NEGATIVE 02/18/2019 0431   HGBUR NEGATIVE 02/18/2019 0431   BILIRUBINUR NEGATIVE 02/18/2019 0431   KETONESUR NEGATIVE 02/18/2019 0431   PROTEINUR 30 (A) 02/18/2019 0431   UROBILINOGEN 0.2 05/05/2015 1023     NITRITE NEGATIVE 02/18/2019 0431   LEUKOCYTESUR TRACE (A) 02/18/2019 0431   Sepsis Labs Invalid input(s): PROCALCITONIN,  WBC,  LACTICIDVEN Microbiology Recent Results (from the past 240 hour(s))  SARS Coronavirus 2 [facility] order, Performed in [facility] hospital lab) Nasopharyngeal Nasopharyngeal Swab     Status: None   Collection Time: 02/18/19  5:43 AM   Specimen: Nasopharyngeal Swab  Result Value Ref Range Status   SARS Coronavirus 2 NEGATIVE NEGATIVE Final    Comment: (NOTE) If result is NEGATIVE SARS-CoV-2 target nucleic acids are NOT DETECTED. The SARS-CoV-2 RNA is generally detectable in upper and lower  respiratory specimens during the acute phase of infection. The lowest  concentration of SARS-CoV-2 viral copies this assay can detect is 250  copies / mL. A negative result does not preclude SARS-CoV-2 infection  and should not be used as the sole basis for treatment or other  patient management decisions.  A negative result may occur  with  improper specimen collection / handling, submission of specimen other  than nasopharyngeal swab, presence of viral mutation(s) within the  areas targeted by this assay, and inadequate number of viral copies  (<250 copies / mL). A negative result must be combined with clinical  observations, patient history, and epidemiological information. If result is POSITIVE SARS-CoV-2 target nucleic acids are DETECTED. The SARS-CoV-2 RNA is generally detectable in upper and lower  respiratory specimens dur ing the acute phase of infection.  Positive  results are indicative of active infection with SARS-CoV-2.  Clinical  correlation with patient history and other diagnostic information is  necessary to determine patient infection status.  Positive results do  not rule out bacterial infection or co-infection with other viruses. If result is PRESUMPTIVE POSTIVE SARS-CoV-2 nucleic acids MAY BE PRESENT.   A presumptive positive result was obtained  on the submitted specimen  and confirmed on repeat testing.  While 2019 novel coronavirus  (SARS-CoV-2) nucleic acids may be present in the submitted sample  additional confirmatory testing may be necessary for epidemiological  and / or clinical management purposes  to differentiate between  SARS-CoV-2 and other Sarbecovirus currently known to infect humans.  If clinically indicated additional testing with an alternate test  methodology [phone]) is advised. The SARS-CoV-2 RNA is generally  detectable in upper and lower respiratory sp ecimens during the acute  phase of infection. The expected result is Negative. Fact Sheet for Patients:  StrictlyIdeas.no Fact Sheet for Healthcare Providers: BankingDealers.co.za This test is not yet approved or cleared by the [place] FDA and has been authorized for detection and/or diagnosis of SARS-CoV-2 by FDA under an Emergency Use Authorization (EUA).  This EUA will remain in effect (meaning this test can be used) for the duration of the COVID-19 declaration under Section 564(b)(1) of the Act, 21 U.S.C. section 360bbb-3(b)(1), unless the authorization is terminated or revoked sooner. Performed at [facility], [place] [address] [place], [place] [address]   MRSA PCR Screening     Status: None   Collection Time: 02/18/19 11:20 AM   Specimen: Nasopharyngeal  Result Value Ref Range Status   MRSA by PCR NEGATIVE NEGATIVE Final    Comment:        The GeneXpert MRSA Assay (FDA approved for NASAL specimens only), is one component of a comprehensive MRSA colonization surveillance program. It is not intended to diagnose MRSA infection nor to guide or monitor treatment for MRSA infections. Performed at [facility], [place] [address], [place], [place] [address]      Time coordinating discharge: Over 35 minutes  SIGNED:   Cordelia Poche, MD [facility] 02/21/2019, 1:33 PM

## 2019-02-21 NOTE — Progress Notes (Signed)
   02/21/19 1100  Clinical Encounter Type  Visited With Family  Visit Type Initial;Psychological support;Spiritual support  Referral From Palliative care team  Spiritual Encounters  Spiritual Needs Emotional;Other (Comment) (Spiritual Care Conversation/Support)  Stress Factors  Family Stress Factors Loss;Major life changes;Other (Comment) (Comfort)   I spoke with the patient's daughter and provided spiritual care support. She states that the family's main concern is the patient's comfort. She is glad that the patient's other children are able to visit him and spend this time with him. She did not specify any particular needs.   We will continue to follow to provide support for the family.   Please, contact Spiritual care for further assistance.   Chaplain Shanon Ace M.Div., [facility]

## 2019-02-21 NOTE — Progress Notes (Signed)
PROGRESS NOTE    Gregory Hatfield  MAU:[id number] DOB: 30-Mar-1926 DOA: 02/18/2019 PCP: Patient, No Pcp Per   Brief Narrative: Gregory Hatfield is a 85 y.o. dementia, type 2 diabetes mellitus, stage IV CKD, hyperlipidemia, chronic diastolic heart failure, bradycardia. Patient admitted secondary to confusion and found to have aspiration pneumonia. Currently transitioned to comfort care.   Assessment & Plan:   Active Problems:   Hypoglycemia   Pneumonia   Aspiration pneumonia Dysphagia Patient was initially started on IV antibiotics, Vancomycin, Cefepime. Goals of care discussions lead to transition to full comfort care. -Soft diet  AKI on CKD stage IV Comfort care currently.  Diabetes mellitus, type 2 Comfort care. No insulin correction  Hypernatremia In setting of poor oral intake. Currently comfort care  Anemia of chronic disease  Bilateral buttock pressure ulcers, POA   DVT prophylaxis: None Code Status:   Code Status: DNR Family Communication: None Disposition Plan: Discharge to hospice when bed is available   Consultants:   None  Procedures:   None  Antimicrobials:  Vancomycin  Cefepime    Subjective: Mumbles today. Not clear.  Objective: Vitals:   02/20/19 0800 02/20/19 0941 02/20/19 2109 02/21/19 0502  BP:  (!) 111/47 (!) 128/50 (!) 139/58  Pulse: (!) 51 (!) 53 (!) 53 (!) 58  Resp:   18   Temp:   98.3 F (36.8 C) (!) 97.5 F (36.4 C)  TempSrc:      SpO2: 97% 97% 94% 100%  Weight:      Height:        Intake/Output Summary (Last 24 hours) at 02/21/2019 1235 Last data filed at 02/21/2019 1000 Gross per 24 hour  Intake 0 ml  Output 800 ml  Net -800 ml   Filed Weights   02/18/19 1105  Weight: 90.5 kg    Examination:  General: Well appearing, no distress   Data Reviewed: I have personally reviewed following labs and imaging studies  CBC: Recent Labs  Lab 02/18/19 0417 02/19/19 0231  WBC 13.1* 10.4  NEUTROABS 10.1*  --    HGB 10.8* 10.9*  HCT 37.2* 37.8*  MCV 98.2 98.4  PLT 377 563   Basic Metabolic Panel: Recent Labs  Lab 02/18/19 0417 02/18/19 1122 02/18/19 1508 02/18/19 2043 02/19/19 0231  NA 163* 163* 163* 162* 162*  K 3.9 4.0 4.4 4.2 4.7  CL 128* >130* >130* 131* >130*  CO2 23 21* 21* 20* 19*  GLUCOSE 139* 100* 133* 157* 249*  BUN 109* 107* 103* 97* 92*  CREATININE 4.08* 3.88* 3.99* 4.08* 4.09*  CALCIUM 9.6 9.1 9.0 9.1 9.2   GFR: Estimated Creatinine Clearance: 12.9 mL/min (A) (by C-G formula based on SCr of 4.09 mg/dL (H)). Liver Function Tests: Recent Labs  Lab 02/18/19 0417 02/19/19 0231  AST 22 33  ALT 23 25  ALKPHOS 63 60  BILITOT 0.3 0.3  PROT 7.4 7.0  ALBUMIN 2.6* 2.5*   No results for input(s): LIPASE, AMYLASE in the last 168 hours. No results for input(s): AMMONIA in the last 168 hours. Coagulation Profile: No results for input(s): INR, PROTIME in the last 168 hours. Cardiac Enzymes: No results for input(s): CKTOTAL, CKMB, CKMBINDEX, TROPONINI in the last 168 hours. BNP (last 3 results) No results for input(s): PROBNP in the last 8760 hours. HbA1C: No results for input(s): HGBA1C in the last 72 hours. CBG: Recent Labs  Lab 02/19/19 0451 02/19/19 0810 02/19/19 1245 02/19/19 1702 02/19/19 2143  GLUCAP 224* 241* 206* 202* 188*  Lipid Profile: No results for input(s): CHOL, HDL, LDLCALC, TRIG, CHOLHDL, LDLDIRECT in the last 72 hours. Thyroid Function Tests: No results for input(s): TSH, T4TOTAL, FREET4, T3FREE, THYROIDAB in the last 72 hours. Anemia Panel: No results for input(s): VITAMINB12, FOLATE, FERRITIN, TIBC, IRON, RETICCTPCT in the last 72 hours. Sepsis Labs: Recent Labs  Lab 02/18/19 0417  LATICACIDVEN 0.9    Recent Results (from the past 240 hour(s))  SARS Coronavirus 2 [facility] order, Performed in [facility] hospital lab) Nasopharyngeal Nasopharyngeal Swab     Status: None   Collection Time: 02/18/19  5:43 AM   Specimen: Nasopharyngeal  Swab  Result Value Ref Range Status   SARS Coronavirus 2 NEGATIVE NEGATIVE Final    Comment: (NOTE) If result is NEGATIVE SARS-CoV-2 target nucleic acids are NOT DETECTED. The SARS-CoV-2 RNA is generally detectable in upper and lower  respiratory specimens during the acute phase of infection. The lowest  concentration of SARS-CoV-2 viral copies this assay can detect is 250  copies / mL. A negative result does not preclude SARS-CoV-2 infection  and should not be used as the sole basis for treatment or other  patient management decisions.  A negative result may occur with  improper specimen collection / handling, submission of specimen other  than nasopharyngeal swab, presence of viral mutation(s) within the  areas targeted by this assay, and inadequate number of viral copies  (<250 copies / mL). A negative result must be combined with clinical  observations, patient history, and epidemiological information. If result is POSITIVE SARS-CoV-2 target nucleic acids are DETECTED. The SARS-CoV-2 RNA is generally detectable in upper and lower  respiratory specimens dur ing the acute phase of infection.  Positive  results are indicative of active infection with SARS-CoV-2.  Clinical  correlation with patient history and other diagnostic information is  necessary to determine patient infection status.  Positive results do  not rule out bacterial infection or co-infection with other viruses. If result is PRESUMPTIVE POSTIVE SARS-CoV-2 nucleic acids MAY BE PRESENT.   A presumptive positive result was obtained on the submitted specimen  and confirmed on repeat testing.  While 2019 novel coronavirus  (SARS-CoV-2) nucleic acids may be present in the submitted sample  additional confirmatory testing may be necessary for epidemiological  and / or clinical management purposes  to differentiate between  SARS-CoV-2 and other Sarbecovirus currently known to infect humans.  If clinically indicated  additional testing with an alternate test  methodology [phone]) is advised. The SARS-CoV-2 RNA is generally  detectable in upper and lower respiratory sp ecimens during the acute  phase of infection. The expected result is Negative. Fact Sheet for Patients:  StrictlyIdeas.no Fact Sheet for Healthcare Providers: BankingDealers.co.za This test is not yet approved or cleared by the [place] FDA and has been authorized for detection and/or diagnosis of SARS-CoV-2 by FDA under an Emergency Use Authorization (EUA).  This EUA will remain in effect (meaning this test can be used) for the duration of the COVID-19 declaration under Section 564(b)(1) of the Act, 21 U.S.C. section 360bbb-3(b)(1), unless the authorization is terminated or revoked sooner. Performed at [facility], [place] [address] [place],  [address]   MRSA PCR Screening     Status: None   Collection Time: 02/18/19 11:20 AM   Specimen: Nasopharyngeal  Result Value Ref Range Status   MRSA by PCR NEGATIVE NEGATIVE Final    Comment:        The GeneXpert MRSA Assay (FDA approved for NASAL  specimens only), is one component of a comprehensive MRSA colonization surveillance program. It is not intended to diagnose MRSA infection nor to guide or monitor treatment for MRSA infections. Performed at [facility], [place] [address] [place], [place] [address]          Radiology Studies: No results found.      Scheduled Meds: . latanoprost  1 drop Right Eye QHS  . mouth rinse  15 mL Mouth Rinse BID   Continuous Infusions:   LOS: 3 days     Cordelia Poche, MD [facility] 02/21/2019, 12:35 PM  If 7PM-7AM, please contact night-coverage [email or web address]

## 2019-02-21 NOTE — Progress Notes (Signed)
Patient remains on the hospice home waiting list.  No bed to offer today.  We will continue to follow.  Dimas Aguas, RN [facility] [phone]

## 2019-02-21 NOTE — Progress Notes (Signed)
Called report to Caryl Asp, Therapist, sports at [facility]. Per the nurse ,to leave in iv.

## 2019-02-21 NOTE — Care Management Important Message (Signed)
Important Message  Patient Details IM Letter given to Cookie McGibboney RN to present to the Patient Name: Gregory Hatfield MRN: [id number] Date of Birth: 07/14/1925   Medicare Important Message Given:  Yes     Kerin Salen 02/21/2019, 11:15 AM

## 2019-02-21 NOTE — TOC Progression Note (Signed)
Transition of Care [facility]) - Progression Note    Patient Details  Name: Gregory Hatfield MRN: [id number] Date of Birth: 08/15/1925  [facility]) CM/SW Contact  Purcell Mouton, RN Phone Number: 02/21/2019, 2:47 PM  Clinical Narrative:    Spoke with pt's daughter Velva Harman at bedside concerning discharge plans to another Hospice Home. Pt was accepted at [facility] and will transfer today at 3:30 PM.    Expected Discharge Plan: [place] Barriers to Discharge: No Barriers Identified  Expected Discharge Plan and Services Expected Discharge Plan: [place] arrangements for the past 2 months: [place] Expected Discharge Date: 02/21/19                                     Social Determinants of Health (SDOH) Interventions    Readmission Risk Interventions No flowsheet data found.

## 2019-02-21 NOTE — Progress Notes (Signed)
Manufacturing engineer [facility])  [place] does not have a bed to offer today.  Hospice Home in [place] does not either.  Gregory Hatfield will remain on the waiting list for both should a bed become available.  Venia Carbon RN, BSN, [facility] Liaison (in [place]) [phone]

## 2019-03-21 DEATH — deceased

## 2019-04-17 ENCOUNTER — Encounter: Payer: Self-pay | Admitting: Internal Medicine
